# Patient Record
Sex: Female | Born: 1980 | Race: White | Hispanic: No | Marital: Single | Smoking: Former smoker
Health system: Southern US, Community
[De-identification: ages and names within clinical notes are randomized; demographics above are authoritative.]

## PROBLEM LIST (undated history)

## (undated) DIAGNOSIS — R51 Headache: Secondary | ICD-10-CM

## (undated) DIAGNOSIS — I639 Cerebral infarction, unspecified: Secondary | ICD-10-CM

## (undated) DIAGNOSIS — K219 Gastro-esophageal reflux disease without esophagitis: Secondary | ICD-10-CM

## (undated) DIAGNOSIS — IMO0001 Reserved for inherently not codable concepts without codable children: Secondary | ICD-10-CM

## (undated) DIAGNOSIS — J383 Other diseases of vocal cords: Secondary | ICD-10-CM

## (undated) DIAGNOSIS — F419 Anxiety disorder, unspecified: Secondary | ICD-10-CM

## (undated) DIAGNOSIS — R0600 Dyspnea, unspecified: Secondary | ICD-10-CM

## (undated) DIAGNOSIS — K5792 Diverticulitis of intestine, part unspecified, without perforation or abscess without bleeding: Secondary | ICD-10-CM

## (undated) DIAGNOSIS — F32A Depression, unspecified: Secondary | ICD-10-CM

## (undated) DIAGNOSIS — I10 Essential (primary) hypertension: Secondary | ICD-10-CM

## (undated) DIAGNOSIS — A419 Sepsis, unspecified organism: Secondary | ICD-10-CM

## (undated) DIAGNOSIS — N179 Acute kidney failure, unspecified: Secondary | ICD-10-CM

## (undated) DIAGNOSIS — J9690 Respiratory failure, unspecified, unspecified whether with hypoxia or hypercapnia: Secondary | ICD-10-CM

## (undated) DIAGNOSIS — J189 Pneumonia, unspecified organism: Secondary | ICD-10-CM

## (undated) DIAGNOSIS — F319 Bipolar disorder, unspecified: Secondary | ICD-10-CM

## (undated) DIAGNOSIS — R569 Unspecified convulsions: Secondary | ICD-10-CM

## (undated) DIAGNOSIS — F329 Major depressive disorder, single episode, unspecified: Secondary | ICD-10-CM

## (undated) DIAGNOSIS — I6350 Cerebral infarction due to unspecified occlusion or stenosis of unspecified cerebral artery: Secondary | ICD-10-CM

## (undated) HISTORY — DX: Cerebral infarction, unspecified: I63.9

## (undated) HISTORY — PX: APPENDECTOMY: SHX54

## (undated) NOTE — *Deleted (*Deleted)
Report given to Melanie Wright ,RN 

---
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
## 2000-10-04 ENCOUNTER — Ambulatory Visit (HOSPITAL_COMMUNITY): Admission: RE | Admit: 2000-10-04 | Discharge: 2000-10-04 | Payer: Self-pay | Admitting: Internal Medicine

## 2000-10-04 ENCOUNTER — Encounter: Payer: Self-pay | Admitting: Internal Medicine

## 2000-10-19 ENCOUNTER — Emergency Department (HOSPITAL_COMMUNITY): Admission: EM | Admit: 2000-10-19 | Discharge: 2000-10-20 | Payer: Self-pay | Admitting: *Deleted

## 2000-11-10 ENCOUNTER — Ambulatory Visit (HOSPITAL_COMMUNITY): Admission: RE | Admit: 2000-11-10 | Discharge: 2000-11-10 | Payer: Self-pay | Admitting: Internal Medicine

## 2000-11-10 ENCOUNTER — Encounter: Payer: Self-pay | Admitting: Internal Medicine

## 2001-01-15 ENCOUNTER — Emergency Department (HOSPITAL_COMMUNITY): Admission: EM | Admit: 2001-01-15 | Discharge: 2001-01-16 | Payer: Self-pay | Admitting: Emergency Medicine

## 2001-02-04 ENCOUNTER — Encounter: Payer: Self-pay | Admitting: *Deleted

## 2001-02-04 ENCOUNTER — Emergency Department (HOSPITAL_COMMUNITY): Admission: EM | Admit: 2001-02-04 | Discharge: 2001-02-04 | Payer: Self-pay | Admitting: *Deleted

## 2001-03-21 ENCOUNTER — Emergency Department (HOSPITAL_COMMUNITY): Admission: EM | Admit: 2001-03-21 | Discharge: 2001-03-22 | Payer: Self-pay | Admitting: *Deleted

## 2001-03-22 ENCOUNTER — Encounter: Payer: Self-pay | Admitting: *Deleted

## 2001-04-07 ENCOUNTER — Emergency Department (HOSPITAL_COMMUNITY): Admission: EM | Admit: 2001-04-07 | Discharge: 2001-04-07 | Payer: Self-pay | Admitting: Emergency Medicine

## 2001-04-29 ENCOUNTER — Emergency Department (HOSPITAL_COMMUNITY): Admission: EM | Admit: 2001-04-29 | Discharge: 2001-04-29 | Payer: Self-pay | Admitting: *Deleted

## 2001-04-29 ENCOUNTER — Encounter: Payer: Self-pay | Admitting: *Deleted

## 2001-06-15 ENCOUNTER — Emergency Department (HOSPITAL_COMMUNITY): Admission: EM | Admit: 2001-06-15 | Discharge: 2001-06-15 | Payer: Self-pay | Admitting: Internal Medicine

## 2001-07-04 ENCOUNTER — Emergency Department (HOSPITAL_COMMUNITY): Admission: EM | Admit: 2001-07-04 | Discharge: 2001-07-04 | Payer: Self-pay | Admitting: Internal Medicine

## 2001-07-06 ENCOUNTER — Emergency Department (HOSPITAL_COMMUNITY): Admission: EM | Admit: 2001-07-06 | Discharge: 2001-07-06 | Payer: Self-pay | Admitting: Emergency Medicine

## 2002-03-15 ENCOUNTER — Inpatient Hospital Stay (HOSPITAL_COMMUNITY): Admission: EM | Admit: 2002-03-15 | Discharge: 2002-03-18 | Payer: Self-pay | Admitting: *Deleted

## 2002-03-15 ENCOUNTER — Encounter: Payer: Self-pay | Admitting: Internal Medicine

## 2002-03-15 ENCOUNTER — Encounter: Payer: Self-pay | Admitting: *Deleted

## 2002-06-01 ENCOUNTER — Emergency Department (HOSPITAL_COMMUNITY): Admission: EM | Admit: 2002-06-01 | Discharge: 2002-06-01 | Payer: Self-pay | Admitting: Emergency Medicine

## 2005-10-23 ENCOUNTER — Emergency Department (HOSPITAL_COMMUNITY): Admission: EM | Admit: 2005-10-23 | Discharge: 2005-10-23 | Payer: Self-pay | Admitting: Emergency Medicine

## 2007-10-19 ENCOUNTER — Emergency Department: Payer: Self-pay | Admitting: Emergency Medicine

## 2007-10-19 ENCOUNTER — Other Ambulatory Visit: Payer: Self-pay

## 2010-05-13 HISTORY — PX: TRANSRECTAL DRAINAGE OF PELVIC ABSCESS: SUR1387

## 2010-10-29 NOTE — Group Therapy Note (Signed)
   NAME:  Taylor [name], Taylor [name]                           ACCOUNT NO.:  [id number]   MEDICAL RECORD NO.:  [id number]                   PATIENT TYPE:  INP   LOCATION:  A209                                 FACILITY:  [facility]   PHYSICIAN:  Edward L. Luan Pulling, M.D.             DATE OF BIRTH:  Jan 23, 1981   DATE OF PROCEDURE:  03/18/2002  DATE OF DISCHARGE:                                   PROGRESS NOTE   SUMMARY:  The patient has been able to come off the ventilator and transfer  from the intensive care unit, and at this point I will plan to sign off.  Thank you very much for allowing me to see her with you.                                               Edward L. Luan Pulling, M.D.    ELH/MEDQ  D:  03/18/2002  T:  03/19/2002  Job:  [id number]
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
## 2010-10-29 NOTE — Discharge Summary (Signed)
NAME:  Taylor [name], Taylor [name]                           ACCOUNT NO.:  [id number]   MEDICAL RECORD NO.:  [id number]                   PATIENT TYPE:  INP   LOCATION:  A209                                 FACILITY:  [facility]   PHYSICIAN:  Angus G. Everette Rank, M.D.              DATE OF BIRTH:  16-Feb-1981   DATE OF ADMISSION:  03/15/2002  DATE OF DISCHARGE:  03/18/2002                                 DISCHARGE SUMMARY   DISCHARGE DIAGNOSES:  1. Narcotic overdose, methadone.  2. Acute respiratory distress.  3. Insulin dependent diabetes.  4. Chronic back pain.  5. Morbid obesity.  6. Depression.  7. Hypertension.   CONDITION ON DISCHARGE:  The patient's condition was stable at the time of  her discharge.   HISTORY:  A 30 year old morbidly obese white female admitted to the ED  unresponsive.  She became lethargic earlier in the day and was stumbling  around.  Currently on methadone for narcotic dependency.  Apparently, she  went to the methadone clinic earlier on the day of admission.  Apparently,  the patient had recently been taking increased quantities of methadone only  above the recommended dosage.  After the time of admission, she was  intubated and placed in ICU.   PHYSICAL EXAMINATION:  VITAL SIGNS:  Blood pressure 162/34, respirations 60,  pulse 150, temperature 100.1.  HEENT:  Eyes, PERRLA, TMs negative.  HEART:  Regular rhythm, no murmurs.  LUNGS:  Distant breath sounds.  ABDOMEN:  No palpable organomegaly, abdomen obese.  EXTREMITIES:  1+ left extremity edema.  NEUROLOGIC:  The patient is unresponsive, but moves all extremities and  withdraws from pain.  SKIN:  No rashes.   LABORATORY DATA:  CBC on admission, WBC 17,500 with hemoglobin 10.9 and  hematocrit 39.  Subsequent CBC on March 16, 2002, WBC 10,400, hemoglobin  10.0, hematocrit 29.8.  Chemistries on admission, sodium 134, potassium 3.8,  chloride 100, CO2 27, glucose 243, BUN 5, creatinine 0.6, calcium 8.8.  Subsequent  chemistries on March 16, 2002, sodium 141, potassium 3.4,  chloride 111, CO2 28, glucose 134, BUN 6, creatinine 0.6.  Prolactin 41.2.  Drug screen essentially negative, but was positive for tricyclics.  Chest x-  ray, endotracheal tube remains in satisfactory position following  intubation.  A prior chest x-ray showed left perihilar left lower lobe  atelectatic change.  A CT of the head was negative for acute intracranial  process, ethmoid and sphenoid sinus disease.   HOSPITAL COURSE:  The patient was intubated following admission.  Dr.  Luan Pulling monitor the patient's condition while on the ventilator.  She  progressively improved and was able to be extubated on March 16, 2002.  Medications which were started were Protonix 40 mg daily, K-Dur 20 mEq  daily, Klonopin 1 mg h.s., trazodone 100 mg h.s.  The patient denied taking  any extra methadone, but according to family members, it was highly  suspicious she may have taken overdose.  Methadone level pending.  The  patient was able to be discharged after three days with appointment to  psychiatrist made prior to her discharge.                                               Angus G. Everette Rank, M.D.    AGM/MEDQ  D:  04/11/2002  T:  04/11/2002  Job:  [id number]

## 2010-10-29 NOTE — Group Therapy Note (Signed)
   NAME:  Taylor [name], Taylor [name]                           ACCOUNT NO.:  [id number]   MEDICAL RECORD NO.:  [id number]                   PATIENT TYPE:  INP   LOCATION:  A209                                 FACILITY:  [facility]   PHYSICIAN:  Edward L. Luan Pulling, M.D.             DATE OF BIRTH:  1980-09-26   DATE OF PROCEDURE:  DATE OF DISCHARGE:                                   PROGRESS NOTE   OBJECTIVE:  Ms. Kloos is doing much better this morning.  She is alert.  She  is less sedated and is doing generally better.  Her chest is much clearer.  As mentioned she is much more alert and able to move around more.  Blood  gases show excellent oxygenation.  Normal pH.  This is on the ventilator, of  course.   ASSESSMENT:  She is much improved.   PLAN:  She is to be switched her ________ and then attempt weaning today.  Hopefully she will be able to come off the ventilator tonight if she  continues to do well.                                               Edward L. Luan Pulling, M.D.    ELH/MEDQ  D:  03/18/2002  T:  03/19/2002  Job:  [id number]

## 2011-05-18 ENCOUNTER — Encounter: Payer: Self-pay | Admitting: Emergency Medicine

## 2011-05-18 ENCOUNTER — Emergency Department (HOSPITAL_COMMUNITY)
Admission: EM | Admit: 2011-05-18 | Discharge: 2011-05-18 | Disposition: A | Discharge status: Home / Self Care | Payer: Self-pay | Attending: Emergency Medicine | Admitting: Emergency Medicine

## 2011-05-18 ENCOUNTER — Emergency Department (HOSPITAL_COMMUNITY): Payer: Self-pay

## 2011-05-18 DIAGNOSIS — R059 Cough, unspecified: Secondary | ICD-10-CM | POA: Insufficient documentation

## 2011-05-18 DIAGNOSIS — E119 Type 2 diabetes mellitus without complications: Secondary | ICD-10-CM | POA: Insufficient documentation

## 2011-05-18 DIAGNOSIS — F3289 Other specified depressive episodes: Secondary | ICD-10-CM | POA: Insufficient documentation

## 2011-05-18 DIAGNOSIS — R079 Chest pain, unspecified: Secondary | ICD-10-CM | POA: Insufficient documentation

## 2011-05-18 DIAGNOSIS — J069 Acute upper respiratory infection, unspecified: Secondary | ICD-10-CM

## 2011-05-18 DIAGNOSIS — R070 Pain in throat: Secondary | ICD-10-CM | POA: Insufficient documentation

## 2011-05-18 DIAGNOSIS — K089 Disorder of teeth and supporting structures, unspecified: Secondary | ICD-10-CM | POA: Insufficient documentation

## 2011-05-18 DIAGNOSIS — F329 Major depressive disorder, single episode, unspecified: Secondary | ICD-10-CM | POA: Insufficient documentation

## 2011-05-18 DIAGNOSIS — J3489 Other specified disorders of nose and nasal sinuses: Secondary | ICD-10-CM | POA: Insufficient documentation

## 2011-05-18 DIAGNOSIS — R51 Headache: Secondary | ICD-10-CM | POA: Insufficient documentation

## 2011-05-18 DIAGNOSIS — F17200 Nicotine dependence, unspecified, uncomplicated: Secondary | ICD-10-CM | POA: Insufficient documentation

## 2011-05-18 DIAGNOSIS — K0889 Other specified disorders of teeth and supporting structures: Secondary | ICD-10-CM

## 2011-05-18 DIAGNOSIS — R05 Cough: Secondary | ICD-10-CM | POA: Insufficient documentation

## 2011-05-18 DIAGNOSIS — R509 Fever, unspecified: Secondary | ICD-10-CM | POA: Insufficient documentation

## 2011-05-18 HISTORY — DX: Depression, unspecified: F32.A

## 2011-05-18 HISTORY — DX: Major depressive disorder, single episode, unspecified: F32.9

## 2011-05-18 MED ORDER — PENICILLIN V POTASSIUM 500 MG PO TABS: 500.0000 mg | ORAL_TABLET | Freq: Four times a day (QID) | ORAL | Status: AC

## 2011-05-18 MED ORDER — PENICILLIN V POTASSIUM 250 MG PO TABS
500.0000 mg | ORAL_TABLET | Freq: Once | ORAL | Status: AC
  Administered 2011-05-18: 500 mg via ORAL
  Filled 2011-05-18: qty 2

## 2011-05-18 MED ORDER — HYDROCODONE-ACETAMINOPHEN 5-325 MG PO TABS
1.0000 | ORAL_TABLET | Freq: Once | ORAL | Status: AC
  Administered 2011-05-18: 1 via ORAL
  Filled 2011-05-18: qty 1

## 2011-05-18 MED ORDER — IBUPROFEN 800 MG PO TABS: 800.0000 mg | ORAL_TABLET | Freq: Three times a day (TID) | ORAL | Status: AC

## 2011-05-18 MED ORDER — ACETAMINOPHEN 500 MG PO TABS
500.0000 mg | ORAL_TABLET | Freq: Once | ORAL | Status: AC
  Administered 2011-05-18: 500 mg via ORAL
  Filled 2011-05-18: qty 1

## 2011-05-18 MED ORDER — HYDROCODONE-ACETAMINOPHEN 5-325 MG PO TABS: 1.0000 | ORAL_TABLET | ORAL | Status: AC | PRN

## 2011-05-18 NOTE — ED Provider Notes (Signed)
History   This chart was scribed for [employer]. Olin Hauser, MD by Kathreen Cornfield. The patient was seen in room APA12/APA12 and the patient's care was started at 8:06AM.    CSN: [id number] Arrival date & time: 05/18/2011  7:35 AM   First MD Initiated Contact with Patient 05/18/11 0751      Chief Complaint  Patient presents with  . Chest Pain  . Cough  . Sore Throat  . Headache    (Consider location/radiation/quality/duration/timing/severity/associated sxs/prior treatment) HPI Taylor [name] is a 30 y.o. female who presents to the Emergency Department complaining of moderate, constant dental pain onset 2 days ago with associated symptoms including right sided facial swelling and tenderness. Patient also c/o moderate to severe non-productive cough onset yesterday with associated chest congestion, fever measured as high as 103. Pt. Denies vomiting, diarrhea, abdominal pain. Pt. Has treated symptoms at home with Ibuprofen, last taken 3 hours ago. Pt. Has a history of smoking.   Past Medical History  Diagnosis Date  . Diabetes mellitus   . Depression     Past Surgical History  Procedure Date  . Appendectomy   . Transrectal drainage of pelvic abscess     History reviewed. No pertinent family history.  History  Substance Use Topics  . Smoking status: Current Everyday Smoker    Types: Cigarettes  . Smokeless tobacco: Not on file  . Alcohol Use: No    OB History    Grav Para Term Preterm Abortions TAB SAB Ect Mult Living                  Review of Systems  10 Systems reviewed and are negative for acute change except as noted in the HPI.  Allergies  Compazine  Home Medications   Current Outpatient Rx  Name Route Sig Dispense Refill  . ALPRAZOLAM 1 MG PO TABS Oral Take 1 mg by mouth 3 (three) times daily as needed. Anxiety/ patient states she usually cuts tab in half and she never takes 3 a day     . DULOXETINE HCL 60 MG PO CPEP Oral Take 60 mg by mouth daily.        BP 135/92   Pulse 99  Temp(Src) 97.9 F (36.6 C) (Oral)  Resp 20  Ht 5\' 4"  (1.626 m)  Wt 180 lb (81.647 kg)  BMI 30.90 kg/m2  SpO2 98%  LMP 05/02/2011  Physical Exam  Nursing note and vitals reviewed. Constitutional: She is oriented to person, place, and time. She appears well-developed and well-nourished. No distress.  HENT:  Head: Normocephalic and atraumatic.  Nose: Nose normal.       Poor dentition of the teeth, black lining at the gum lines, multiple caries. Pre and post auricular adenopathy. No submental adenopathy.   Eyes: EOM are normal. Pupils are equal, round, and reactive to light.  Neck: Neck supple. No tracheal deviation present.  Cardiovascular: Normal rate and regular rhythm.   Pulmonary/Chest: Effort normal. No respiratory distress. She has no wheezes. She has no rales.  Abdominal: Soft. She exhibits no distension. There is no tenderness.  Musculoskeletal: Normal range of motion. She exhibits no edema.  Neurological: She is alert and oriented to person, place, and time. No sensory deficit.  Skin: Skin is warm and dry.  Psychiatric: She has a normal mood and affect. Her behavior is normal.    ED Course  Procedures (including critical care time)  Labs Reviewed - No data to display Dg Chest 2 View  05/18/2011  *RADIOLOGY REPORT*  Clinical Data: Cough.  Left-sided chest pain.  CHEST - 2 VIEW  Comparison:  None.  Findings:  The heart size and mediastinal contours are within normal limits.  Both lungs are clear.  No evidence of pneumothorax or pleural effusion.  The visualized skeletal structures are unremarkable.  IMPRESSION: No active cardiopulmonary disease.  Original Report Authenticated By: Marlaine Hind, M.D.     No diagnosis found.  No results found for this or any previous visit.  DIAGNOSTIC STUDIES: Oxygen Saturation is 98% on room air, normal by my interpretation.    COORDINATION OF CARE:  8:12AM- EDP at bedside discusses treatment plan. 9:26AM-Recheck. EDP  discusses discharge.  MDM  Patient with face pain due to poor dentition and URI symptoms. Chest xray negative. Initiated antibiotic therapy and pain management.Pt stable in ED with no significant deterioration in condition.The patient appears reasonably screened and/or stabilized for discharge and I doubt any other medical condition or other [facility] requiring further screening, evaluation, or treatment in the ED at this time prior to discharge.  .I personally performed the services described in this documentation, which was scribed in my presence. The recorded information has been reviewed and considered.  MDM Reviewed: nursing note and vitals Interpretation: labs        Gypsy Balsam. Olin Hauser, MD 05/18/11 [phone]

## 2011-05-18 NOTE — ED Notes (Signed)
Pt c/o chest pain with headache, cough, and sore throat since last night.

## 2011-06-14 DIAGNOSIS — N179 Acute kidney failure, unspecified: Secondary | ICD-10-CM

## 2011-06-14 HISTORY — DX: Acute kidney failure, unspecified: N17.9

## 2012-02-13 ENCOUNTER — Emergency Department (HOSPITAL_COMMUNITY): Payer: Self-pay

## 2012-02-13 ENCOUNTER — Emergency Department (HOSPITAL_COMMUNITY)
Admission: EM | Admit: 2012-02-13 | Discharge: 2012-02-13 | Disposition: A | Discharge status: Home / Self Care | Payer: Self-pay | Attending: Emergency Medicine | Admitting: Emergency Medicine

## 2012-02-13 ENCOUNTER — Encounter (HOSPITAL_COMMUNITY): Payer: Self-pay | Admitting: *Deleted

## 2012-02-13 DIAGNOSIS — R071 Chest pain on breathing: Secondary | ICD-10-CM | POA: Insufficient documentation

## 2012-02-13 DIAGNOSIS — I10 Essential (primary) hypertension: Secondary | ICD-10-CM | POA: Insufficient documentation

## 2012-02-13 DIAGNOSIS — F329 Major depressive disorder, single episode, unspecified: Secondary | ICD-10-CM | POA: Insufficient documentation

## 2012-02-13 DIAGNOSIS — M25519 Pain in unspecified shoulder: Secondary | ICD-10-CM | POA: Insufficient documentation

## 2012-02-13 DIAGNOSIS — E119 Type 2 diabetes mellitus without complications: Secondary | ICD-10-CM | POA: Insufficient documentation

## 2012-02-13 DIAGNOSIS — M25512 Pain in left shoulder: Secondary | ICD-10-CM

## 2012-02-13 DIAGNOSIS — Z9089 Acquired absence of other organs: Secondary | ICD-10-CM | POA: Insufficient documentation

## 2012-02-13 DIAGNOSIS — F17200 Nicotine dependence, unspecified, uncomplicated: Secondary | ICD-10-CM | POA: Insufficient documentation

## 2012-02-13 DIAGNOSIS — R0789 Other chest pain: Secondary | ICD-10-CM

## 2012-02-13 DIAGNOSIS — F3289 Other specified depressive episodes: Secondary | ICD-10-CM | POA: Insufficient documentation

## 2012-02-13 HISTORY — DX: Essential (primary) hypertension: I10

## 2012-02-13 MED ORDER — IBUPROFEN 600 MG PO TABS: 600.0000 mg | ORAL_TABLET | Freq: Four times a day (QID) | ORAL | Status: AC | PRN

## 2012-02-13 MED ORDER — IBUPROFEN 400 MG PO TABS
600.0000 mg | ORAL_TABLET | Freq: Once | ORAL | Status: AC
  Administered 2012-02-13: 600 mg via ORAL
  Filled 2012-02-13: qty 2

## 2012-02-13 MED ORDER — METHOCARBAMOL 500 MG PO TABS
500.0000 mg | ORAL_TABLET | Freq: Once | ORAL | Status: AC
  Administered 2012-02-13: 500 mg via ORAL
  Filled 2012-02-13: qty 1

## 2012-02-13 MED ORDER — HYDROCODONE-ACETAMINOPHEN 5-325 MG PO TABS: 1.0000 | ORAL_TABLET | Freq: Four times a day (QID) | ORAL | Status: AC | PRN

## 2012-02-13 MED ORDER — METHOCARBAMOL 500 MG PO TABS: 500.0000 mg | ORAL_TABLET | Freq: Two times a day (BID) | ORAL | Status: AC
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
## 2012-02-13 NOTE — ED Notes (Signed)
Patient with no complaints at this time. Respirations even and unlabored. Skin warm/dry. Discharge instructions reviewed with patient at this time. Patient given opportunity to voice concerns/ask questions. Patient discharged at this time and left Emergency Department with steady gait.   

## 2012-03-02 ENCOUNTER — Emergency Department (HOSPITAL_COMMUNITY)
Admission: EM | Admit: 2012-03-02 | Discharge: 2012-03-03 | Disposition: A | Discharge status: Other Acute Inpatient Hospital | Payer: Self-pay | Attending: Emergency Medicine | Admitting: Emergency Medicine

## 2012-03-02 ENCOUNTER — Emergency Department (HOSPITAL_COMMUNITY): Payer: Self-pay

## 2012-03-02 ENCOUNTER — Encounter (HOSPITAL_COMMUNITY): Payer: Self-pay | Admitting: *Deleted

## 2012-03-02 DIAGNOSIS — R079 Chest pain, unspecified: Secondary | ICD-10-CM | POA: Insufficient documentation

## 2012-03-02 DIAGNOSIS — H53489 Generalized contraction of visual field, unspecified eye: Secondary | ICD-10-CM | POA: Insufficient documentation

## 2012-03-02 DIAGNOSIS — R599 Enlarged lymph nodes, unspecified: Secondary | ICD-10-CM | POA: Insufficient documentation

## 2012-03-02 DIAGNOSIS — E119 Type 2 diabetes mellitus without complications: Secondary | ICD-10-CM | POA: Insufficient documentation

## 2012-03-02 DIAGNOSIS — F1910 Other psychoactive substance abuse, uncomplicated: Secondary | ICD-10-CM | POA: Insufficient documentation

## 2012-03-02 DIAGNOSIS — F29 Unspecified psychosis not due to a substance or known physiological condition: Secondary | ICD-10-CM | POA: Insufficient documentation

## 2012-03-02 LAB — CBC WITH DIFFERENTIAL/PLATELET
Basophils Relative: 0 % (ref 0–1)
Eosinophils Absolute: 0.1 10*3/uL (ref 0.0–0.7)
Eosinophils Relative: 1 % (ref 0–5)
HCT: 42.4 % (ref 36.0–46.0)
Hemoglobin: 14.7 g/dL (ref 12.0–15.0)
Lymphs Abs: 4.1 10*3/uL — ABNORMAL HIGH (ref 0.7–4.0)
MCH: 30 pg (ref 26.0–34.0)
MCV: 86.5 fL (ref 78.0–100.0)
Neutrophils Relative %: 60 % (ref 43–77)
RBC: 4.9 MIL/uL (ref 3.87–5.11)
WBC: 12 10*3/uL — ABNORMAL HIGH (ref 4.0–10.5)

## 2012-03-02 LAB — URINALYSIS, ROUTINE W REFLEX MICROSCOPIC

## 2012-03-02 LAB — GLUCOSE, CAPILLARY

## 2012-03-02 LAB — COMPREHENSIVE METABOLIC PANEL
ALT: 7 U/L (ref 0–35)
AST: 13 U/L (ref 0–37)
Albumin: 3.6 g/dL (ref 3.5–5.2)
Alkaline Phosphatase: 111 U/L (ref 39–117)
CO2: 27 mEq/L (ref 19–32)
Chloride: 95 mEq/L — ABNORMAL LOW (ref 96–112)
Glucose, Bld: 338 mg/dL — ABNORMAL HIGH (ref 70–99)
Total Bilirubin: 0.5 mg/dL (ref 0.3–1.2)

## 2012-03-02 LAB — URINE MICROSCOPIC-ADD ON

## 2012-03-02 LAB — RAPID URINE DRUG SCREEN, HOSP PERFORMED
Amphetamines: NOT DETECTED
Benzodiazepines: NOT DETECTED
Opiates: NOT DETECTED
Tetrahydrocannabinol: NOT DETECTED

## 2012-03-02 LAB — LIPASE, BLOOD: Lipase: 35 U/L (ref 11–59)

## 2012-03-02 MED ORDER — SODIUM CHLORIDE 0.9 % IV SOLN: INTRAVENOUS | Status: DC

## 2012-03-02 MED ORDER — POTASSIUM CHLORIDE CRYS ER 20 MEQ PO TBCR
40.0000 meq | EXTENDED_RELEASE_TABLET | Freq: Once | ORAL | Status: AC
  Administered 2012-03-02: 40 meq via ORAL
  Filled 2012-03-02: qty 2

## 2012-03-02 MED ORDER — SODIUM CHLORIDE 0.9 % IV BOLUS (SEPSIS)
1000.0000 mL | Freq: Once | INTRAVENOUS | Status: AC
  Administered 2012-03-02: 1000 mL via INTRAVENOUS
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
## 2012-03-02 NOTE — ED Notes (Signed)
Pt had a cbg done at this time . Taylor [name]

## 2012-03-02 NOTE — ED Notes (Addendum)
Taken bottle of xanax 90 day rx over 10 day span, No xanax in 6 days,  Hallucinations, visual. Brother says pt fell recently and was given vicodin for pain, Has been "on a pill rampage" since then. Pt says she  Lost 50 lbs since July. Took large amt of tramadol recently also per brother.

## 2012-03-02 NOTE — ED Provider Notes (Addendum)
History   This chart was scribed for Mervin Kung, MD, by Joline Maxcy. The patient was seen in room APA04/APA04 and the patient's care was started at 1744.    CSN: [id number]  Arrival date & time 03/02/12  32   First MD Initiated Contact with Patient 03/02/12 1744      Chief Complaint  Patient presents with  . Medical Clearance    (Consider location/radiation/quality/duration/timing/severity/associated sxs/prior treatment) HPI Comments: Taylor [name] is a 31 y.o. female who presents to the Emergency Department complaining of drug overdose that occurred 6-7 days ago with worsening symptoms over the past 48 hours. Pt states she has taken 90 day supply of 1 mg xanax, 180 day supply of tramadol, and hydrocodone. Pt has associated symptoms of chest pain that began in the last 20 minutes, defensive behavior, hallucinations, and visual changes that feel like tunnel vision.  Pt states that she took Cymbalta today, but denies taking any other pills. Pt states she took the pills because of pain, but continued to take them to achieve the initial feeling. Pt denies having suicidal ideations, fevers, abdominal pain, dysuria, nausea, vomiting, diarrhea, or generalized weakness. Pt's brother states that the pt has a h/o drug overdoses with the last episode occurred 1.5 to 2 years ago. Pt has been hospitalized numerous times for similar issues and has been treated for substance abuse. Pt's brother states that this episode brought on by an ED visit on 9/2. Pt also has a h/o depression, anxiety, and DM.  PCP: Dr. Reece Levy at [facility]   Patient is a 31 y.o. female presenting with Overdose. The history is provided by the patient and a relative.  Drug Overdose This is a recurrent problem. The current episode started 2 days ago. The problem occurs constantly. The problem has been gradually worsening. Associated symptoms include chest pain. Pertinent negatives include no abdominal pain and no shortness of  breath. She has tried nothing for the symptoms.    Past Medical History  Diagnosis Date  . Diabetes mellitus   . Depression   . Hypertension     Past Surgical History  Procedure Date  . Appendectomy   . Transrectal drainage of pelvic abscess     History reviewed. No pertinent family history.  History  Substance Use Topics  . Smoking status: Current Every Day Smoker    Types: Cigarettes  . Smokeless tobacco: Not on file  . Alcohol Use: No    OB History    Grav Para Term Preterm Abortions TAB SAB Ect Mult Living                  Review of Systems  Constitutional: Negative for fever.  HENT: Negative for neck pain.   Eyes: Positive for visual disturbance.       Tunnel vision.  Respiratory: Negative for shortness of breath.   Cardiovascular: Positive for chest pain.       Chest pain within the last 20 minutes.  Gastrointestinal: Negative for nausea, vomiting, abdominal pain and diarrhea.  Genitourinary: Negative for dysuria.  Musculoskeletal: Negative for back pain.  Neurological: Negative for weakness.  Psychiatric/Behavioral: Positive for hallucinations. Negative for suicidal ideas and self-injury.  All other systems reviewed and are negative.    Allergies  Compazine and Oxycodone  Home Medications   Current Outpatient Rx  Name Route Sig Dispense Refill  . ALPRAZOLAM 1 MG PO TABS Oral Take 1 mg by mouth 3 (three) times daily as needed. Anxiety/ patient states she  usually cuts tab in half and she never takes 3 a day     . DULOXETINE HCL 60 MG PO CPEP Oral Take 60 mg by mouth daily.      . IBUPROFEN 200 MG PO TABS Oral Take 400 mg by mouth every 6 (six) hours as needed. Pain      BP 144/78  Pulse 107  Temp 98.7 F (37.1 C) (Oral)  Resp 18  Ht 5\' 4"  (1.626 m)  Wt 170 lb (77.111 kg)  BMI 29.18 kg/m2  SpO2 98%  LMP 02/13/2012  Physical Exam  Nursing note and vitals reviewed. Constitutional: She is oriented to person, place, and time. She appears  well-developed and well-nourished. No distress.  HENT:  Head: Normocephalic and atraumatic.       Mucous membranes moist.  Eyes: EOM are normal. Pupils are equal, round, and reactive to light.       Pupils are 3 mm and equal.  Neck: Normal range of motion. Neck supple. No tracheal deviation present.       Cervical adenopathy anteriorly, but not posteriorly.   Cardiovascular: Normal rate, regular rhythm and normal heart sounds.   No murmur heard. Pulmonary/Chest: Effort normal and breath sounds normal. No respiratory distress. She exhibits no tenderness.  Abdominal: Soft. Bowel sounds are normal. She exhibits no distension. There is no tenderness.  Musculoskeletal: Normal range of motion. She exhibits no edema.       Moves all extremities.   Lymphadenopathy:    She has cervical adenopathy.  Neurological: She is alert and oriented to person, place, and time.       Cranial nerves intact. No protonater drift.  Skin: Skin is warm. She is diaphoretic.  Psychiatric: She has a normal mood and affect. Her behavior is normal.    ED Course  Procedures (including critical care time)  DIAGNOSTIC STUDIES: Oxygen Saturation is 98% on room air, normal by my interpretation.    COORDINATION OF CARE:  18:25- Discussed planned course of treatment with the patient, including a drug screen, UA, blood work, and acetaminophen, and chest x-ray who is agreeable at this time.  Results for orders placed during the hospital encounter of 03/02/12  CBC WITH DIFFERENTIAL      Component Value Range   WBC 12.0 (*) 4.0 - 10.5 K/uL   RBC 4.90  3.87 - 5.11 MIL/uL   Hemoglobin 14.7  12.0 - 15.0 g/dL   HCT 42.4  36.0 - 46.0 %   MCV 86.5  78.0 - 100.0 fL   MCH 30.0  26.0 - 34.0 pg   MCHC 34.7  30.0 - 36.0 g/dL   RDW 13.9  11.5 - 15.5 %   Platelets 263  150 - 400 K/uL   Neutrophils Relative 60  43 - 77 %   Neutro Abs 7.2  1.7 - 7.7 K/uL   Lymphocytes Relative 34  12 - 46 %   Lymphs Abs 4.1 (*) 0.7 - 4.0 K/uL     Monocytes Relative 5  3 - 12 %   Monocytes Absolute 0.6  0.1 - 1.0 K/uL   Eosinophils Relative 1  0 - 5 %   Eosinophils Absolute 0.1  0.0 - 0.7 K/uL   Basophils Relative 0  0 - 1 %   Basophils Absolute 0.0  0.0 - 0.1 K/uL  COMPREHENSIVE METABOLIC PANEL      Component Value Range   Sodium 132 (*) 135 - 145 mEq/L   Potassium 3.3 (*) 3.5 - 5.1  mEq/L   Chloride 95 (*) 96 - 112 mEq/L   CO2 27  19 - 32 mEq/L   Glucose, Bld 338 (*) 70 - 99 mg/dL   BUN 8  6 - 23 mg/dL   Creatinine, Ser 0.41 (*) 0.50 - 1.10 mg/dL   Calcium 9.6  8.4 - 10.5 mg/dL   Total Protein 7.3  6.0 - 8.3 g/dL   Albumin 3.6  3.5 - 5.2 g/dL   AST 13  0 - 37 U/L   ALT 7  0 - 35 U/L   Alkaline Phosphatase 111  39 - 117 U/L   Total Bilirubin 0.5  0.3 - 1.2 mg/dL   GFR calc non Af Amer >90  >90 mL/min   GFR calc Af Amer >90  >90 mL/min  LIPASE, BLOOD      Component Value Range   Lipase 35  11 - 59 U/L  URINALYSIS, ROUTINE W REFLEX MICROSCOPIC      Component Value Range   Color, Urine YELLOW  YELLOW   APPearance CLEAR  CLEAR   Specific Gravity, Urine 1.025  1.005 - 1.030   pH 6.5  5.0 - 8.0   Glucose, UA >1000 (*) NEGATIVE mg/dL   Hgb urine dipstick SMALL (*) NEGATIVE   Bilirubin Urine NEGATIVE  NEGATIVE   Ketones, ur 15 (*) NEGATIVE mg/dL   Protein, ur TRACE (*) NEGATIVE mg/dL   Urobilinogen, UA 0.2  0.0 - 1.0 mg/dL   Nitrite NEGATIVE  NEGATIVE   Leukocytes, UA TRACE (*) NEGATIVE  PREGNANCY, URINE      Component Value Range   Preg Test, Ur NEGATIVE  NEGATIVE  URINE RAPID DRUG SCREEN (HOSP PERFORMED)      Component Value Range   Opiates NONE DETECTED  NONE DETECTED   Cocaine NONE DETECTED  NONE DETECTED   Benzodiazepines NONE DETECTED  NONE DETECTED   Amphetamines NONE DETECTED  NONE DETECTED   Tetrahydrocannabinol NONE DETECTED  NONE DETECTED   Barbiturates NONE DETECTED  NONE DETECTED  ACETAMINOPHEN LEVEL      Component Value Range   Acetaminophen (Tylenol), Serum <15.0  10 - 30 ug/mL  URINE  MICROSCOPIC-ADD ON      Component Value Range   Squamous Epithelial / LPF MANY (*) RARE   WBC, UA 11-20  <3 WBC/hpf   RBC / HPF 3-6  <3 RBC/hpf   Bacteria, UA MANY (*) RARE   Urine-Other CLUE CELLS PRESENT        Date: 03/02/2012  Rate: 100  Rhythm: normal sinus rhythm  QRS Axis: normal  Intervals: normal  ST/T Wave abnormalities: normal  Conduction Disutrbances:none  Narrative Interpretation:   Old EKG Reviewed: none available    Labs Reviewed  CBC WITH DIFFERENTIAL - Abnormal; Notable for the following:    WBC 12.0 (*)     Lymphs Abs 4.1 (*)     All other components within normal limits  COMPREHENSIVE METABOLIC PANEL - Abnormal; Notable for the following:    Sodium 132 (*)     Potassium 3.3 (*)     Chloride 95 (*)     Glucose, Bld 338 (*)     Creatinine, Ser 0.41 (*)     All other components within normal limits  URINALYSIS, ROUTINE W REFLEX MICROSCOPIC - Abnormal; Notable for the following:    Glucose, UA >1000 (*)     Hgb urine dipstick SMALL (*)     Ketones, ur 15 (*)     Protein, ur TRACE (*)  Leukocytes, UA TRACE (*)     All other components within normal limits  URINE MICROSCOPIC-ADD ON - Abnormal; Notable for the following:    Squamous Epithelial / LPF MANY (*)     Bacteria, UA MANY (*)     All other components within normal limits  LIPASE, BLOOD  PREGNANCY, URINE  URINE RAPID DRUG SCREEN (HOSP PERFORMED)  ACETAMINOPHEN LEVEL  URINE CULTURE   Dg Chest 2 View  03/02/2012  *RADIOLOGY REPORT*  Clinical Data: Cough, fever  CHEST - 2 VIEW  Comparison: 02/19/2012  Findings: Cardiomediastinal silhouette is unremarkable.  No acute infiltrate or pleural effusion.  No pulmonary edema.  Bony thorax is unremarkable.  IMPRESSION: No active disease.   Original Report Authenticated By: Lahoma Crocker, M.D.    Results for orders placed during the hospital encounter of 03/02/12  CBC WITH DIFFERENTIAL      Component Value Range   WBC 12.0 (*) 4.0 - 10.5 K/uL   RBC 4.90   3.87 - 5.11 MIL/uL   Hemoglobin 14.7  12.0 - 15.0 g/dL   HCT 42.4  36.0 - 46.0 %   MCV 86.5  78.0 - 100.0 fL   MCH 30.0  26.0 - 34.0 pg   MCHC 34.7  30.0 - 36.0 g/dL   RDW 13.9  11.5 - 15.5 %   Platelets 263  150 - 400 K/uL   Neutrophils Relative 60  43 - 77 %   Neutro Abs 7.2  1.7 - 7.7 K/uL   Lymphocytes Relative 34  12 - 46 %   Lymphs Abs 4.1 (*) 0.7 - 4.0 K/uL   Monocytes Relative 5  3 - 12 %   Monocytes Absolute 0.6  0.1 - 1.0 K/uL   Eosinophils Relative 1  0 - 5 %   Eosinophils Absolute 0.1  0.0 - 0.7 K/uL   Basophils Relative 0  0 - 1 %   Basophils Absolute 0.0  0.0 - 0.1 K/uL  COMPREHENSIVE METABOLIC PANEL      Component Value Range   Sodium 132 (*) 135 - 145 mEq/L   Potassium 3.3 (*) 3.5 - 5.1 mEq/L   Chloride 95 (*) 96 - 112 mEq/L   CO2 27  19 - 32 mEq/L   Glucose, Bld 338 (*) 70 - 99 mg/dL   BUN 8  6 - 23 mg/dL   Creatinine, Ser 0.41 (*) 0.50 - 1.10 mg/dL   Calcium 9.6  8.4 - 10.5 mg/dL   Total Protein 7.3  6.0 - 8.3 g/dL   Albumin 3.6  3.5 - 5.2 g/dL   AST 13  0 - 37 U/L   ALT 7  0 - 35 U/L   Alkaline Phosphatase 111  39 - 117 U/L   Total Bilirubin 0.5  0.3 - 1.2 mg/dL   GFR calc non Af Amer >90  >90 mL/min   GFR calc Af Amer >90  >90 mL/min  LIPASE, BLOOD      Component Value Range   Lipase 35  11 - 59 U/L  URINALYSIS, ROUTINE W REFLEX MICROSCOPIC      Component Value Range   Color, Urine YELLOW  YELLOW   APPearance CLEAR  CLEAR   Specific Gravity, Urine 1.025  1.005 - 1.030   pH 6.5  5.0 - 8.0   Glucose, UA >1000 (*) NEGATIVE mg/dL   Hgb urine dipstick SMALL (*) NEGATIVE   Bilirubin Urine NEGATIVE  NEGATIVE   Ketones, ur 15 (*) NEGATIVE mg/dL   Protein,  ur TRACE (*) NEGATIVE mg/dL   Urobilinogen, UA 0.2  0.0 - 1.0 mg/dL   Nitrite NEGATIVE  NEGATIVE   Leukocytes, UA TRACE (*) NEGATIVE  PREGNANCY, URINE      Component Value Range   Preg Test, Ur NEGATIVE  NEGATIVE  URINE RAPID DRUG SCREEN (HOSP PERFORMED)      Component Value Range   Opiates NONE  DETECTED  NONE DETECTED   Cocaine NONE DETECTED  NONE DETECTED   Benzodiazepines NONE DETECTED  NONE DETECTED   Amphetamines NONE DETECTED  NONE DETECTED   Tetrahydrocannabinol NONE DETECTED  NONE DETECTED   Barbiturates NONE DETECTED  NONE DETECTED  ACETAMINOPHEN LEVEL      Component Value Range   Acetaminophen (Tylenol), Serum <15.0  10 - 30 ug/mL  URINE MICROSCOPIC-ADD ON      Component Value Range   Squamous Epithelial / LPF MANY (*) RARE   WBC, UA 11-20  <3 WBC/hpf   RBC / HPF 3-6  <3 RBC/hpf   Bacteria, UA MANY (*) RARE   Urine-Other CLUE CELLS PRESENT       1. Psychosis   2. Substance abuse   3. Diabetes mellitus       MDM  Workup here negative for any significant medications from the medication overdose no evidence of an delayed Tylenol toxicity. Liver function test are normal urine drug screen is also negative for benzos despite history of taking Xanax frequently. Patient is nontoxic no acute distress. Basic labs to show an elevation in her blood sugar she is a known diabetic has not been taking her medications or following her diet.  Patient's labs here do show some hyperglycemia she is a known diabetic she has not been following her diet but should correct itself with IV fluids IV fluids also given for the very mild hyponatremia and also given 40 mg potassium for the mild hypokalemia. Urine has been sent as a culture suspect his contaminated based on the urinalysis. Patient has been medically cleared has been evaluated back team do to her delusions and hallucinations they feel she needs to be admitted to a mental health facility.  Patient is nontoxic no acute distress.  Patient is a voluntary commitment at this time no suicidal ideation no homicidal ideation.  Patient with obvious delusions and hallucinations during the observation period in the emergency apartment. [facility] team is working on placement will most likely occur in the morning.   I personally  performed the services described in this documentation, which was scribed in my presence. The recorded information has been reviewed and considered.         Mervin Kung, MD 03/02/12 2013  Mervin Kung, MD 03/02/12 2019  Mervin Kung, MD 03/02/12 2201  Addendum: [facility] accepted at [facility] for [facility] health admission. Placed blood sugars come down into the 200 range. Patient nontoxic and clinically stable for admission by psychiatric unit.  Mervin Kung, MD 03/02/12 2258

## 2012-03-02 NOTE — ED Notes (Signed)
Patient transported to X-ray 

## 2012-03-02 NOTE — ED Notes (Signed)
Pt states started back on prescription pain meds a week ago when she came to er for wrist injury. Has been unable to stop using. Feels like she needs them all the time again.

## 2012-03-02 NOTE — ED Notes (Signed)
ekg done at this time and given to DR. Zacowski Alleya Demeter

## 2012-03-02 NOTE — BH Assessment (Signed)
Assessment Note   Taylor [name] is an 31 y.o. female. Pt brought to [facility] by family due to several concerns.  Pt has history of opiate and xanax abuse and pt has been taking too much of these meds again recently.  Pt has run out of meds at this time, last use of any pills was 6 days ago.  Pt reports she did go through withdrawals for several days but is feeling better currently.  Pt now has begun to hallucinate and this is an issue that has not occurred before.  Pt's mother is at [facility] with pt and reports that pt called her at 2am last night saying her uncle was in her home. (Pt's uncle is in a facility out of the area).  Today pt was over at the family home and mother reports pt was having visual hallucinations, talking to her infant niece, who was not present.  Pt also told family that her mother had taken the baby into the basement when mother was not home either.  Pt also seeing animals in the home.  Pt does attend [facility] for depression/Anxiety.  Denies SI/HI and family does not have concerns for SI/HI.  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 Diabetes mellitus   . Depression   . Hypertension    Axis IV: economic problems Axis V: 21-30 behavior considerably influenced by delusions or hallucinations OR serious impairment in judgment, communication OR inability to function in almost all areas  Past Medical History:  Past Medical History  Diagnosis Date  . Diabetes mellitus   . Depression   . Hypertension     Past Surgical History  Procedure Date  . Appendectomy   . Transrectal drainage of pelvic abscess     Family History: History reviewed. No pertinent family history.  Social History:  reports that she has been smoking Cigarettes.  She does not have any smokeless tobacco history on file. She reports that she does not drink alcohol or use illicit drugs.  Additional Social History:   Alcohol / Drug Use Pain Medications: Current abuse Prescriptions: Current abuse Over the Counter: Pd denies History of alcohol / drug use?: Yes Longest period of sobriety (when/how long): none recent Negative Consequences of Use: Personal relationships Substance #1 Name of Substance 1: hydrocodone 1 - Amount (size/oz): 15 pills in 6 days.  Pt given script in ER. 1 - Frequency: daily 1 - Duration: 6 days 1 - Last Use / Amount: 12 days ago Substance #2 Name of Substance 2: tramadol.  Pt used entire month's supply of 180 pills in 8 days. 2 - Amount (size/oz): 20 pills 2 - Frequency: daily 2 - Duration: 8 days 2 - Last Use / Amount: 1 week ago Substance #3 Name of Substance 3: xanax.  Pt used months worth of xanax by taking 6mg  per day, is prescribed 3 mg per day. 3 - Amount (size/oz): 6mg  3 - Frequency: daily 3 - Duration: 2 weeks 3 - Last Use / Amount: 6 days ago.  CIWA: CIWA-Ar BP: 144/78 mmHg Pulse Rate: 107  COWS: Clinical Opiate Withdrawal Scale (COWS) Resting Pulse Rate: Pulse Rate 101-120 Sweating: No report of chills or flushing Restlessness: Able to sit still Pupil Size: Pupils pinned or normal size for room light Bone or Joint Aches: Not present Runny Nose or Tearing: Not present GI Upset: No GI symptoms Tremor: No tremor Yawning: No yawning Anxiety  or Irritability: None Gooseflesh Skin: Skin is smooth COWS Total Score: 2   Allergies:  Allergies  Allergen Reactions  . Compazine Other (See Comments)    Unknown  . Oxycodone     Home Medications:  (Not in a hospital admission)  OB/GYN Status:  Patient's last menstrual period was 02/13/2012.  General Assessment Data Location of Assessment: [facility] Assessment: Yes Living Arrangements: Alone Can pt return to current living arrangement?: Yes Admission Status: Voluntary Is patient capable of signing voluntary admission?: Yes Transfer from: [facility] Referral Source: Self/Family/Friend  Education  Status Is patient currently in school?: No  Risk to self Suicidal Ideation: No Suicidal Intent: No Is patient at risk for suicide?: No Suicidal Plan?: No Access to Means: No What has been your use of drugs/alcohol within the last 12 months?: current abuse of prescription pills Previous Attempts/Gestures: No Intentional Self Injurious Behavior: None Family Suicide History: No Recent stressful life event(s): Financial Problems;Other (Comment) (changes at work) Persecutory voices/beliefs?: No Depression: Yes Depression Symptoms: Tearfulness;Isolating;Fatigue;Loss of interest in usual pleasures Substance abuse history and/or treatment for substance abuse?: Yes Suicide prevention information given to non-admitted patients: Not applicable  Risk to Others Homicidal Ideation: No Thoughts of Harm to Others: No Current Homicidal Intent: No Current Homicidal Plan: No Access to Homicidal Means: No History of harm to others?: No Assessment of Violence: None Noted Does patient have access to weapons?: No Criminal Charges Pending?: No Does patient have a court date: No  Psychosis Hallucinations: Visual;Auditory (see note) Delusions: None noted  Mental Status Report Appear/Hygiene: Disheveled Eye Contact: Fair Motor Activity: Unremarkable Speech: Logical/coherent Level of Consciousness: Alert Mood: Depressed;Anxious Affect: Appropriate to circumstance Anxiety Level: Moderate Thought Processes: Relevant Judgement: Impaired Orientation: Person;Place;Time;Situation Obsessive Compulsive Thoughts/Behaviors: None  Cognitive Functioning Concentration: Normal Memory: Recent Intact;Remote Intact IQ: Below Average Level of Function:  (unknown) Insight: Fair Impulse Control: Fair Appetite: Poor Weight Loss: 70  (significant weight loss in past 6 weeks) Weight Gain: 0  Sleep: Increased Total Hours of Sleep: 9  Vegetative Symptoms: None  ADLScreening [facility] Assessment  Services) Patient's cognitive ability adequate to safely complete daily activities?: Yes Patient able to express need for assistance with ADLs?: Yes Independently performs ADLs?: Yes (appropriate for developmental age)  Abuse/Neglect [facility]) Physical Abuse: Denies Verbal Abuse: Denies Sexual Abuse: Denies  Prior Inpatient Therapy Prior Inpatient Therapy: Yes (multipl detoxes for pills, also mental health admit at age15) Prior Therapy Dates: 2011 Prior Therapy Facilty/Provider(s): [facility] Reason for Treatment: Detox  Prior Outpatient Therapy Prior Outpatient Therapy: Yes Prior Therapy Dates: current Prior Therapy Facilty/Provider(s): [facility] Reason for Treatment: meds  ADL Screening (condition at time of admission) Patient's cognitive ability adequate to safely complete daily activities?: Yes Patient able to express need for assistance with ADLs?: Yes Independently performs ADLs?: Yes (appropriate for developmental age) Weakness of Legs: None Weakness of Arms/Hands: None  Home Assistive Devices/Equipment Home Assistive Devices/Equipment: None    Abuse/Neglect Assessment (Assessment to be complete while patient is alone) Physical Abuse: Denies Verbal Abuse: Denies Sexual Abuse: Denies Exploitation of patient/patient's resources: Denies Self-Neglect: Denies Values / Beliefs Cultural Requests During Hospitalization: None Spiritual Requests During Hospitalization: None   Advance Directives (For Healthcare) Advance Directive: Patient does not have advance directive;Patient would not like information    Additional Information 1:1 In Past 12 Months?: No CIRT Risk: No Elopement Risk: No Does patient have medical clearance?: Yes     Disposition: Discussed pt with Dr Rogene Houston who agreed with plan to refer  for inpt psych treatment.  Pt referred to [facility]. Disposition Disposition of Patient: Inpatient treatment program Type of inpatient treatment program:  Adult  On Site Evaluation by:   Reviewed with Physician:     Joanne Chars 03/02/2012 7:45 PM

## 2012-03-02 NOTE — ED Notes (Signed)
Pt accecpted to [facility] by dr Abbey Chatters. [place] [address]. Call report to [phone]

## 2012-03-02 NOTE — ED Notes (Signed)
[facility] in w/ pt at this time.

## 2012-03-03 NOTE — ED Notes (Signed)
Pt placed under IVC. [place] called for transport.

## 2012-03-03 NOTE — ED Notes (Signed)
Pt refusing to be transported by [facility] to [facility]. EDP placing pt under IVC. Marya Amsler called from [facility] to advise of the new condition.

## 2012-03-03 NOTE — ED Provider Notes (Signed)
31yo F, actively hallucinating, paranoid, aggressive, defensive.  Accepted to [facility].  Transport team here; pt now stating she does not want to go.  Will change voluntary status to IVC.  Paperwork completed.  ED RN called [facility] team and [facility] who will still accept pt for admit.   Alfonzo Feller, DO 03/03/12 [phone]

## 2012-03-05 LAB — URINE CULTURE

## 2012-04-13 DIAGNOSIS — J9690 Respiratory failure, unspecified, unspecified whether with hypoxia or hypercapnia: Secondary | ICD-10-CM | POA: Diagnosis present

## 2012-04-13 DIAGNOSIS — I6350 Cerebral infarction due to unspecified occlusion or stenosis of unspecified cerebral artery: Secondary | ICD-10-CM

## 2012-04-13 HISTORY — PX: TRACHEOSTOMY: SUR1362

## 2012-04-13 HISTORY — PX: INSERTION OF DIALYSIS CATHETER: SHX1324

## 2012-04-13 HISTORY — DX: Respiratory failure, unspecified, unspecified whether with hypoxia or hypercapnia: J96.90

## 2012-04-13 HISTORY — DX: Cerebral infarction due to unspecified occlusion or stenosis of unspecified cerebral artery: I63.50

## 2012-05-10 DIAGNOSIS — I6350 Cerebral infarction due to unspecified occlusion or stenosis of unspecified cerebral artery: Secondary | ICD-10-CM | POA: Insufficient documentation

## 2012-05-25 DIAGNOSIS — Z9119 Patient's noncompliance with other medical treatment and regimen: Secondary | ICD-10-CM | POA: Insufficient documentation

## 2012-07-07 ENCOUNTER — Encounter (HOSPITAL_COMMUNITY): Payer: Self-pay

## 2012-07-07 ENCOUNTER — Emergency Department (HOSPITAL_COMMUNITY): Payer: Medicaid Other

## 2012-07-07 ENCOUNTER — Emergency Department (HOSPITAL_COMMUNITY)
Admission: EM | Admit: 2012-07-07 | Discharge: 2012-07-07 | Disposition: A | Discharge status: Home / Self Care | Payer: Medicaid Other | Attending: Emergency Medicine | Admitting: Emergency Medicine

## 2012-07-07 DIAGNOSIS — F17200 Nicotine dependence, unspecified, uncomplicated: Secondary | ICD-10-CM | POA: Insufficient documentation

## 2012-07-07 DIAGNOSIS — F3289 Other specified depressive episodes: Secondary | ICD-10-CM | POA: Insufficient documentation

## 2012-07-07 DIAGNOSIS — Z79899 Other long term (current) drug therapy: Secondary | ICD-10-CM | POA: Insufficient documentation

## 2012-07-07 DIAGNOSIS — I10 Essential (primary) hypertension: Secondary | ICD-10-CM | POA: Insufficient documentation

## 2012-07-07 DIAGNOSIS — E119 Type 2 diabetes mellitus without complications: Secondary | ICD-10-CM | POA: Insufficient documentation

## 2012-07-07 DIAGNOSIS — F319 Bipolar disorder, unspecified: Secondary | ICD-10-CM | POA: Insufficient documentation

## 2012-07-07 DIAGNOSIS — Z8619 Personal history of other infectious and parasitic diseases: Secondary | ICD-10-CM | POA: Insufficient documentation

## 2012-07-07 DIAGNOSIS — F329 Major depressive disorder, single episode, unspecified: Secondary | ICD-10-CM | POA: Insufficient documentation

## 2012-07-07 DIAGNOSIS — R0789 Other chest pain: Secondary | ICD-10-CM | POA: Insufficient documentation

## 2012-07-07 DIAGNOSIS — Z8709 Personal history of other diseases of the respiratory system: Secondary | ICD-10-CM | POA: Insufficient documentation

## 2012-07-07 DIAGNOSIS — R0602 Shortness of breath: Secondary | ICD-10-CM | POA: Insufficient documentation

## 2012-07-07 DIAGNOSIS — Z8673 Personal history of transient ischemic attack (TIA), and cerebral infarction without residual deficits: Secondary | ICD-10-CM | POA: Insufficient documentation

## 2012-07-07 DIAGNOSIS — F411 Generalized anxiety disorder: Secondary | ICD-10-CM | POA: Insufficient documentation

## 2012-07-07 HISTORY — DX: Bipolar disorder, unspecified: F31.9

## 2012-07-07 HISTORY — DX: Cerebral infarction due to unspecified occlusion or stenosis of unspecified cerebral artery: I63.50

## 2012-07-07 HISTORY — DX: Respiratory failure, unspecified, unspecified whether with hypoxia or hypercapnia: J96.90

## 2012-07-07 HISTORY — DX: Sepsis, unspecified organism: A41.9

## 2012-07-07 HISTORY — DX: Anxiety disorder, unspecified: F41.9

## 2012-07-07 LAB — CBC WITH DIFFERENTIAL/PLATELET
Basophils Absolute: 0 10*3/uL (ref 0.0–0.1)
Eosinophils Relative: 3 % (ref 0–5)
Lymphocytes Relative: 40 % (ref 12–46)
Lymphs Abs: 2.4 10*3/uL (ref 0.7–4.0)
MCV: 93.4 fL (ref 78.0–100.0)
Neutro Abs: 3.1 10*3/uL (ref 1.7–7.7)
Neutrophils Relative %: 51 % (ref 43–77)
Platelets: 290 10*3/uL (ref 150–400)
RBC: 3.02 MIL/uL — ABNORMAL LOW (ref 3.87–5.11)
WBC: 6.1 10*3/uL (ref 4.0–10.5)

## 2012-07-07 LAB — BASIC METABOLIC PANEL
CO2: 24 mEq/L (ref 19–32)
Chloride: 100 mEq/L (ref 96–112)
Glucose, Bld: 123 mg/dL — ABNORMAL HIGH (ref 70–99)
Potassium: 4 mEq/L (ref 3.5–5.1)
Sodium: 135 mEq/L (ref 135–145)

## 2012-07-07 LAB — TROPONIN I
Troponin I: <0.3 ng/mL (ref <0.30)
Troponin I: <0.3 ng/mL (ref <0.30)

## 2012-07-07 MED ORDER — ASPIRIN 81 MG PO CHEW
324.0000 mg | CHEWABLE_TABLET | Freq: Once | ORAL | Status: AC
  Administered 2012-07-07: 324 mg via ORAL
  Filled 2012-07-07: qty 4

## 2012-07-07 MED ORDER — NITROGLYCERIN 0.4 MG SL SUBL
0.4000 mg | SUBLINGUAL_TABLET | SUBLINGUAL | Status: DC | PRN
  Administered 2012-07-07 (×2): 0.4 mg via SUBLINGUAL
  Filled 2012-07-07: qty 25

## 2012-07-07 MED ORDER — HYDROCODONE-ACETAMINOPHEN 5-325 MG PO TABS
2.0000 | ORAL_TABLET | Freq: Once | ORAL | Status: AC
  Administered 2012-07-07: 2 via ORAL
  Filled 2012-07-07: qty 2

## 2012-07-07 NOTE — ED Provider Notes (Signed)
History     CSN: [id number]  Arrival date & time 07/07/12  1622   First MD Initiated Contact with Patient 07/07/12 1626      Chief Complaint  Patient presents with  . Chest Pain    (Consider location/radiation/quality/duration/timing/severity/associated sxs/prior treatment) HPI Pt with history of multiple medical and psychiatric problems was recently discharged from [facility] following a hyperglycemic coma complicated by stroke. She has been at [facility] for the last 2 days where she reports various chronic aches and pains. Today just prior to arrival she reports onset of severe sharp L chest pain, worse with deep breath associated with SOB but no nausea, cough or fever. No diaphoresis. She has had several previous ED visits with chest pain components. She has no known CAD.   Past Medical History  Diagnosis Date  . Diabetes mellitus   . Depression   . Hypertension   . Respiratory failure   . Cerebral artery occlusion with cerebral infarction   . Sepsis   . Bipolar disorder   . Anxiety     Past Surgical History  Procedure Date  . Appendectomy   . Transrectal drainage of pelvic abscess     No family history on file.  History  Substance Use Topics  . Smoking status: Current Every Day Smoker    Types: Cigarettes  . Smokeless tobacco: Not on file  . Alcohol Use: No    OB History    Grav Para Term Preterm Abortions TAB SAB Ect Mult Living                  Review of Systems All other systems reviewed and are negative except as noted in HPI.   Allergies  Compazine and Oxycodone  Home Medications   Current Outpatient Rx  Name  Route  Sig  Dispense  Refill  . ALPRAZOLAM 1 MG PO TABS   Oral   Take 1 mg by mouth 3 (three) times daily as needed. Anxiety/ patient states she usually cuts tab in half and she never takes 3 a day          . DULOXETINE HCL 60 MG PO CPEP   Oral   Take 60 mg by mouth daily.           . IBUPROFEN 200 MG PO TABS   Oral   Take 400  mg by mouth every 6 (six) hours as needed. Pain           LMP 06/05/2012  Physical Exam  Nursing note and vitals reviewed. Constitutional: She is oriented to person, place, and time. She appears well-developed and well-nourished.  HENT:  Head: Normocephalic and atraumatic.  Eyes: EOM are normal. Pupils are equal, round, and reactive to light.  Neck: Normal range of motion. Neck supple.  Cardiovascular: Normal rate, normal heart sounds and intact distal pulses.   Pulmonary/Chest: Effort normal and breath sounds normal. She exhibits no tenderness.  Abdominal: Bowel sounds are normal. She exhibits no distension. There is no tenderness.  Musculoskeletal: Normal range of motion. She exhibits no edema and no tenderness.  Neurological: She is alert and oriented to person, place, and time. She has normal strength. No cranial nerve deficit or sensory deficit.  Skin: Skin is warm and dry. No rash noted.  Psychiatric: She has a normal mood and affect.    ED Course  Procedures (including critical care time)  Labs Reviewed  CBC WITH DIFFERENTIAL - Abnormal; Notable for the following:    RBC  3.02 (*)     Hemoglobin 9.4 (*)     HCT 28.2 (*)     RDW 15.6 (*)     All other components within normal limits  BASIC METABOLIC PANEL - Abnormal; Notable for the following:    Glucose, Bld 123 (*)     GFR calc non Af Amer 88 (*)     All other components within normal limits  TROPONIN I  TROPONIN I   Dg Chest 2 View  07/07/2012  *RADIOLOGY REPORT*  Clinical Data: Shortness of breath.  Back pain.  CHEST - 2 VIEW  Comparison: [facility] chest x-rays 05/07/2012, 05/06/2012, 04/03/2012 [place] chest x-ray 03/02/2012, 05/18/2011 [facility].  Findings: Cardiomediastinal silhouette unremarkable, unchanged. Lungs clear.  Bronchovascular markings normal.  Pulmonary vascularity normal.  No pneumothorax.  No pleural effusions.  Mild degenerative changes involving the thoracic spine.   No significant interval change.  IMPRESSION: No acute cardiopulmonary disease.  Stable examination.   Original Report Authenticated By: Evangeline Dakin, M.D.      No diagnosis found.    MDM   Date: 07/07/2012  Rate: 98  Rhythm: normal sinus rhythm  QRS Axis: normal  Intervals: normal  ST/T Wave abnormalities: normal  Conduction Disutrbances: none  Narrative Interpretation: unremarkable   7:10 PM Atypical chest pain, negative enzymes x 2. Doubt ACS or CAD as the source of her symptoms. She has been asking for Norco which she takes for her various chronic pain syndromes. Plan to return to [facility], PCP follow up as needed.          Sharnee Douglass B. Karle Starch, MD 07/07/12 [id number]

## 2012-07-07 NOTE — ED Notes (Signed)
Patient would like pain medicine. MD made aware. Patient made aware that MD wants to wait until her second troponin results.

## 2012-07-07 NOTE — ED Notes (Signed)
Patient assisted off bedpan. Bedpan overflowed a little. Bed linens changed. Urine specimen saved in urine cup and labeled with patient sticker in case a specimen is needed.

## 2012-07-07 NOTE — ED Notes (Signed)
Pt via EMS from [facility] due to chest pain that started today with substernal chest pain radiating to left arm. Also c/o SOB

## 2012-07-07 NOTE — ED Notes (Addendum)
Denies chest pain at present but does have back pain and requesting pain meds. MD aware no orders received

## 2012-09-07 ENCOUNTER — Encounter: Payer: Self-pay | Admitting: Diagnostic Neuroimaging

## 2012-09-07 ENCOUNTER — Ambulatory Visit (INDEPENDENT_AMBULATORY_CARE_PROVIDER_SITE_OTHER): Payer: Medicaid Other | Admitting: Diagnostic Neuroimaging

## 2012-09-07 VITALS — BP 129/76 | HR 83 | Temp 97.0°F | Ht 64.0 in | Wt 137.0 lb

## 2012-09-07 DIAGNOSIS — M79609 Pain in unspecified limb: Secondary | ICD-10-CM

## 2012-09-07 DIAGNOSIS — I639 Cerebral infarction, unspecified: Secondary | ICD-10-CM

## 2012-09-07 DIAGNOSIS — M79603 Pain in arm, unspecified: Secondary | ICD-10-CM | POA: Insufficient documentation

## 2012-09-07 DIAGNOSIS — G890 Central pain syndrome: Secondary | ICD-10-CM

## 2012-09-07 DIAGNOSIS — I6350 Cerebral infarction due to unspecified occlusion or stenosis of unspecified cerebral artery: Secondary | ICD-10-CM

## 2012-09-07 DIAGNOSIS — E119 Type 2 diabetes mellitus without complications: Secondary | ICD-10-CM | POA: Insufficient documentation

## 2012-09-07 DIAGNOSIS — F329 Major depressive disorder, single episode, unspecified: Secondary | ICD-10-CM | POA: Insufficient documentation

## 2012-09-07 DIAGNOSIS — F319 Bipolar disorder, unspecified: Secondary | ICD-10-CM | POA: Insufficient documentation

## 2012-09-07 DIAGNOSIS — M79606 Pain in leg, unspecified: Secondary | ICD-10-CM | POA: Insufficient documentation

## 2012-09-07 DIAGNOSIS — R413 Other amnesia: Secondary | ICD-10-CM

## 2012-09-07 NOTE — Progress Notes (Signed)
[facility]  PATIENT: Taylor [name] DOB: 03/01/1981  REFERRING CLINICIAN: Jani Gravel HISTORY FROM: patient and mother REASON FOR VISIT: post stroke pain   HISTORICAL  CHIEF COMPLAINT:  Chief Complaint  Patient presents with  . Cerebrovascular Accident  . Pain    legs, arms    HISTORY OF PRESENT ILLNESS:   32 year old left-handed female with history of hypertension, diabetes, bipolar disorder, depression, anxiety, here for evaluation of pain in the left arm and left leg, memory loss, left leg restlessness.  Patient has long history of mood disorder, Xanax and pain medication abuse, hypertension and diabetes, who was found unresponsive by family members on 05/07/12. Patient was taken to [facility]. She was found to have unresponsiveness, then seizure activity, hypothermic, coffee-ground emesis, melanotic stools, and blood glucose of 1706. Due to critical illness patient was transferred to [facility] medical ICU. Patient had a prolonged and complex hospital course, complicated by a right posterior cerebral artery ischemic infarction as noted on CT of the head. MRI of the brain confirmed right PCA infarction along with bilateral thalamic strokes. Patient was discharged from the hospital 07/05/2012 to skilled nursing facility.  Since that time patient continues to have issues with left arm and left leg pain. She is being treated with hydrocodone/Tylenol with mild relief. Patient frequently asks for higher doses of pain medication. Patient is on Cymbalta for mood disorder, Klonopin for anxiety, Keppra for seizure control.    REVIEW OF SYSTEMS: Full 14 system review of systems performed and notable only for weight loss, fatigue, blurred vision, incontinence, joint pain, aching muscles, memory loss, confusion, headache, numbness, weakness, insomnia and restless legs.  ALLERGIES: Allergies  Allergen Reactions  . Compazine Other (See Comments)    Unknown   . Oxycodone Nausea And Vomiting    Severe patient will not take oxycodone or percocet.  . Prochlorperazine Other (See Comments)    Alert but unable to move  . Promethazine Swelling    Lips swell  . Sumatriptan Rash    HOME MEDICATIONS: Outpatient Prescriptions Prior to Visit  Medication Sig Dispense Refill  . acetaminophen (TYLENOL) 325 MG tablet Take 650 mg by mouth every 6 (six) hours as needed. pain      . aspirin EC 81 MG tablet Take 81 mg by mouth daily.      . clonazePAM (KLONOPIN) 1 MG tablet Take 1 mg by mouth 2 (two) times daily as needed. anxiety      . DULoxetine (CYMBALTA) 60 MG capsule Take 60 mg by mouth daily.        . Fiber TABS Take 2 tablets by mouth daily.      Marland Kitchen HYDROcodone-acetaminophen (NORCO/VICODIN) 5-325 MG per tablet Take 1 tablet by mouth every 6 (six) hours as needed. pain      . labetalol (NORMODYNE) 300 MG tablet Take 300 mg by mouth 2 (two) times daily.      . Lactobacillus (ACIDOPHILUS PO) Take 1 tablet by mouth daily.      Marland Kitchen levETIRAcetam (KEPPRA XR) 500 MG 24 hr tablet Take 1,000 mg by mouth daily.      Marland Kitchen omeprazole (PRILOSEC) 20 MG capsule Take 20 mg by mouth daily.      Marland Kitchen senna (SENOKOT) 8.6 MG tablet Take 1 tablet by mouth at bedtime.      . traMADol (ULTRAM) 50 MG tablet Take 50 mg by mouth once.       No facility-administered medications prior to visit.  PAST MEDICAL HISTORY: Past Medical History  Diagnosis Date  . Diabetes mellitus   . Depression   . Hypertension   . Respiratory failure   . Cerebral artery occlusion with cerebral infarction   . Sepsis   . Bipolar disorder   . Anxiety     PAST SURGICAL HISTORY: Past Surgical History  Procedure Laterality Date  . Appendectomy    . Transrectal drainage of pelvic abscess      FAMILY HISTORY: Family History  Problem Relation Age of Onset  . Lung cancer Maternal Grandmother   . Heart attack Maternal Grandfather   . Diabetes Paternal Grandfather     SOCIAL  HISTORY:  History   Social History  . Marital Status: Single    Spouse Name: N/A    Number of Children: N/A  . Years of Education: N/A   Occupational History  . Not on file.   Social History Main Topics  . Smoking status: Current Every Day Smoker -- 0.60 packs/day    Types: Cigarettes  . Smokeless tobacco: Not on file  . Alcohol Use: No  . Drug Use: No  . Sexually Active: Not Currently    Birth Control/ Protection: None   Other Topics Concern  . Not on file   Social History Narrative   Pt lives in nursing home currently. She is single and does not have any children. She has 2 yrs of college level education. She has 5-6 caffeine drinks daily.      PHYSICAL EXAM  Filed Vitals:   09/07/12 1144  BP: 129/76  Pulse: 83  Temp: 97 F (36.1 C)  TempSrc: Oral  Height: 5\' 4"  (1.626 m)  Weight: 137 lb (62.143 kg)   Body mass index is 23.5 kg/(m^2).  GENERAL EXAM: Patient is in no distress  CARDIOVASCULAR: Regular rate and rhythm, no murmurs, no carotid bruits  NEUROLOGIC: MENTAL STATUS: awake, alert, PAUCITY OF SPEECH, BUT ANSWERS MY QUESTIONS AND FOLLOWS COMMANDS. CRANIAL NERVE: no papilledema on fundoscopic exam, pupils equal and reactive to light, visual fields full to confrontation, extraocular muscles intact, no nystagmus, DECR SENS IN RIGHT FACE, DECR LEFT NL FOLD. Uvula midline, shoulder shrug symmetric, tongue midline. MOTOR: normal bulk and tone, full strength in the RUE, RLE; LIMITED IN LUE AND LLE DUE TO PAIN. SOME DECR FFM IN LEFT HAND. DECR FOOT AP ON LEFT. INCREASED TONE IN LEFT ARM AND LEFT LEG.  SENSORY: DECREASED IN LEFT ARM AND LEFT LEG. COORDINATION: finger-nose-finger, fine finger movements SLOW, WORSE IN LEFT SIDE. REFLEXES: deep tendon reflexes present and symmetric GAIT/STATION: SLOW CAUTIOUS GAIT. STEPPAGE IN LEFT LEG. UNSTEADY.   DIAGNOSTIC DATA (LABS, IMAGING, TESTING) - I reviewed patient records, labs, notes, testing and imaging myself  where available.  Lab Results  Component Value Date   WBC 6.1 07/07/2012   HGB 9.4* 07/07/2012   HCT 28.2* 07/07/2012   MCV 93.4 07/07/2012   PLT 290 07/07/2012      Component Value Date/Time   NA 135 07/07/2012 1651   K 4.0 07/07/2012 1651   CL 100 07/07/2012 1651   CO2 24 07/07/2012 1651   GLUCOSE 123* 07/07/2012 1651   BUN 18 07/07/2012 1651   CREATININE 0.86 07/07/2012 1651   CALCIUM 9.8 07/07/2012 1651   PROT 7.3 03/02/2012 1839   ALBUMIN 3.6 03/02/2012 1839   AST 13 03/02/2012 1839   ALT 7 03/02/2012 1839   ALKPHOS 111 03/02/2012 1839   BILITOT 0.5 03/02/2012 1839   GFRNONAA 88* 07/07/2012 1651  GFRAA >90 07/07/2012 1651   No results found for this basename: CHOL, HDL, LDLCALC, LDLDIRECT, TRIG, CHOLHDL   No results found for this basename: HGBA1C   No results found for this basename: VITAMINB12   No results found for this basename: TSH   05/29/12 MRI brain ([facility] report) - images not available for me to review. 1. Evolving right PCA territory infarct with hemorrhagic transformation. Evolving bilateral thalamic infarcts. No new acute infarct is identified. 2. Thin 1 to 2 mm extra axial collection over the left parieto-occipital region may represent subdural hemorrhage or complex effusion.   ASSESSMENT AND PLAN  32 y.o. year old female  has a past medical history of Diabetes mellitus; Depression; Hypertension; Respiratory failure; Cerebral artery occlusion with cerebral infarction; Sepsis; Bipolar disorder; and Anxiety. here with persistent left sided pain and memory loss following complex hospital course (hyperosmolar nonketotic state, sirs, sepsis,retroperitoneal abscess, acute renal failure, ventilator dependent respiratory failure, pneumonia, right occipital and bilateral thalamic strokes).  I will check MRI of the brain to rule out any ongoing acute neurological processes. I suspect her symptoms are sequelae from her prior strokes and medical issues. May consider trial of gabapentin  300 mg at night and increase to 3 times a day for symptom control (I would ask her PCP to consider initiating this medication if medically appropriate). I think she would benefit from long-term pain management clinic evaluation in coordination with her psychiatrist.  Orders Placed This Encounter  Procedures  . MR Brain Wo Contrast    Penni Bombard, MD [id number], [id number] PM Certified in Neurology, Neurophysiology and Neuroimaging  [facility] [address] [place], [place] [address] [phone]

## 2012-09-07 NOTE — Patient Instructions (Addendum)
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  - I will ask PCP to start gabapentin 300mg  qhs and increase to three times a day if medically appropriate - I will check follow up MRI brain scan - may need to consider pain mgmt clinic referral for long term narcotics if appropriate (considering mood d/o and prior medication abuse history)

## 2012-09-18 ENCOUNTER — Other Ambulatory Visit (INDEPENDENT_AMBULATORY_CARE_PROVIDER_SITE_OTHER): Payer: Medicaid Other | Admitting: Diagnostic Neuroimaging

## 2012-09-18 DIAGNOSIS — G890 Central pain syndrome: Secondary | ICD-10-CM

## 2012-09-18 DIAGNOSIS — R413 Other amnesia: Secondary | ICD-10-CM

## 2012-09-18 DIAGNOSIS — I6350 Cerebral infarction due to unspecified occlusion or stenosis of unspecified cerebral artery: Secondary | ICD-10-CM

## 2012-09-18 DIAGNOSIS — I639 Cerebral infarction, unspecified: Secondary | ICD-10-CM

## 2012-09-18 NOTE — Procedures (Signed)
[facility]  NEUROIMAGING REPORT   STUDY DATE: 09/17/12 PATIENT NAME: Taylor [name] DOB: 11/22/1980 MRN: [id number]  ORDERING CLINICIAN: Andrey Spearman, MD  CLINICAL HISTORY: 32 year old female with post-stroke pain.  EXAM: MRI brain (without)  TECHNIQUE: MRI of the brain without contrast was obtained utilizing 5 mm axial slices with T1, T2, T2 flair, T2 star gradient echo and diffusion weighted views.  T1 sagittal and T2 coronal views were obtained. CONTRAST: no IMAGING SITE: [facility]   FINDINGS:  Subacute-chronic right posterior cerebral artery ischemic infarction (DWI hyperintense, ADC isointense, T2FLAIR hyperintense), with encephalomalacia and gliosis in the right occipital lobe and bilateral thalami. Mild chronic microvascular ischemic changes in the pons and subcortical white matter. Cortical laminar necrosis in the right occipital gyri.  Lateral, third and fourth ventricle are normal in size and appearance. No extra-axial fluid collections are seen. No evidence of mass effect or midline shift.    On sagittal views the posterior fossa, pituitary gland and corpus callosum are unremarkable. No evidence of intracranial hemorrhage on gradient-echo views. The orbits and their contents, paranasal sinuses and calvarium are unremarkable.  Intracranial flow voids are present.  IMPRESSION:  Abnormal MRI brain (without) demonstrating: 1. 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 2. Mild chronic microvascular ischemic changes in the pons and subcortical white matter. 3. No acute findings.   INTERPRETING PHYSICIAN:  Penni Bombard, MD Certified in Neurology, Neurophysiology and Neuroimaging  [facility] [address] [place], [place] [address] [phone]

## 2012-09-28 ENCOUNTER — Telehealth: Payer: Self-pay

## 2012-09-28 NOTE — Telephone Encounter (Signed)
Called pt, gave mother MRI results. Went over last OV plans, and informed pain mgmt vs. Gabapentin should be handled via PCP per Dr. Leta Baptist. Mother states pt recently visited w/ PCP who put pt on Gabapentin 300mg  @ hs and decreased Hydrocodone to q8h. Pt is tolerating Gabapentin well, but not the Hydrocodone decrease. Has upcoming PCP visit on 5/5/4, will observe and discuss w/ PCP then if additional pain mgmt needed.

## 2012-10-11 ENCOUNTER — Telehealth: Payer: Self-pay | Admitting: Diagnostic Neuroimaging

## 2012-11-19 DIAGNOSIS — Z0289 Encounter for other administrative examinations: Secondary | ICD-10-CM

## 2012-12-21 NOTE — Telephone Encounter (Signed)
Message forward to Triage [facility]

## 2013-06-03 ENCOUNTER — Encounter: Payer: Medicaid Other | Admitting: Advanced Practice Midwife

## 2013-06-11 ENCOUNTER — Encounter: Payer: Medicaid Other | Admitting: Advanced Practice Midwife

## 2013-12-15 IMAGING — CR DG SHOULDER 2+V*L*
3 series · 3 of 3 positions shown · non-contrast
Comparison: None.

CLINICAL DATA: Pain

LEFT SHOULDER - 2+ VIEW

[view not recorded (1 of 3)]
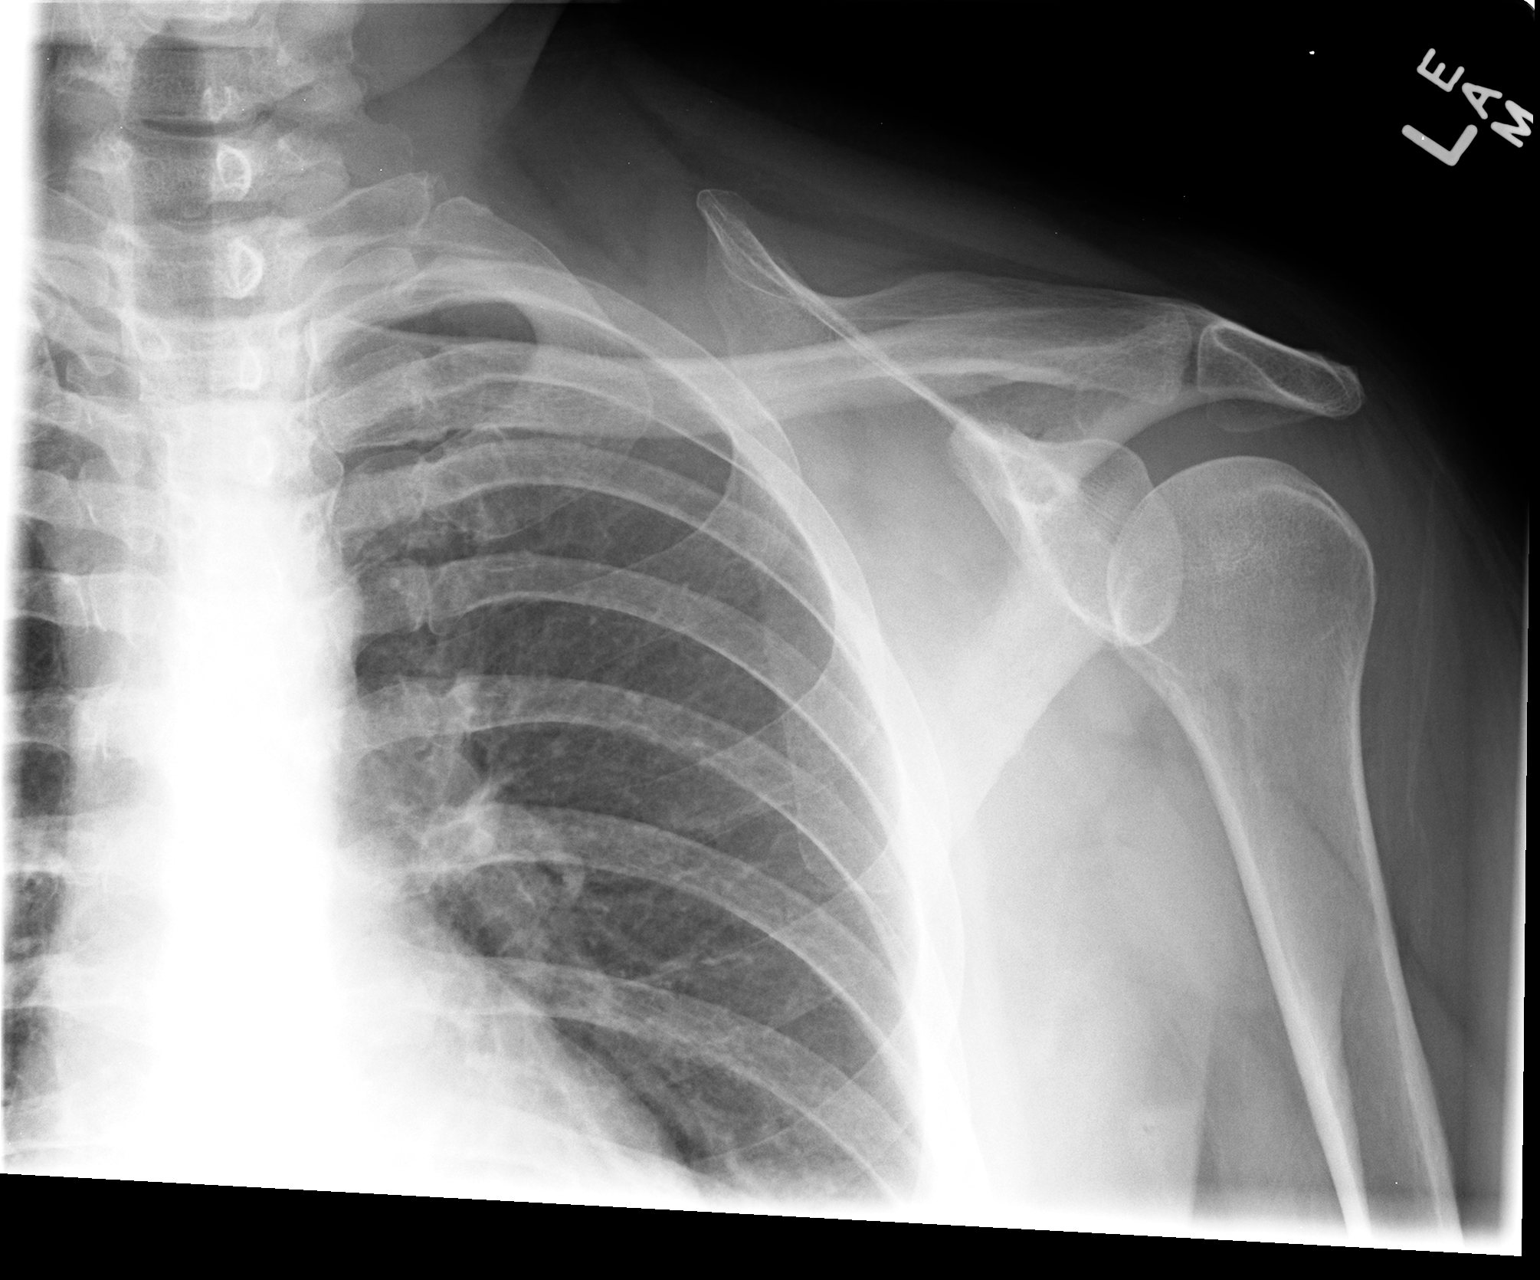

[view not recorded (2 of 3)]
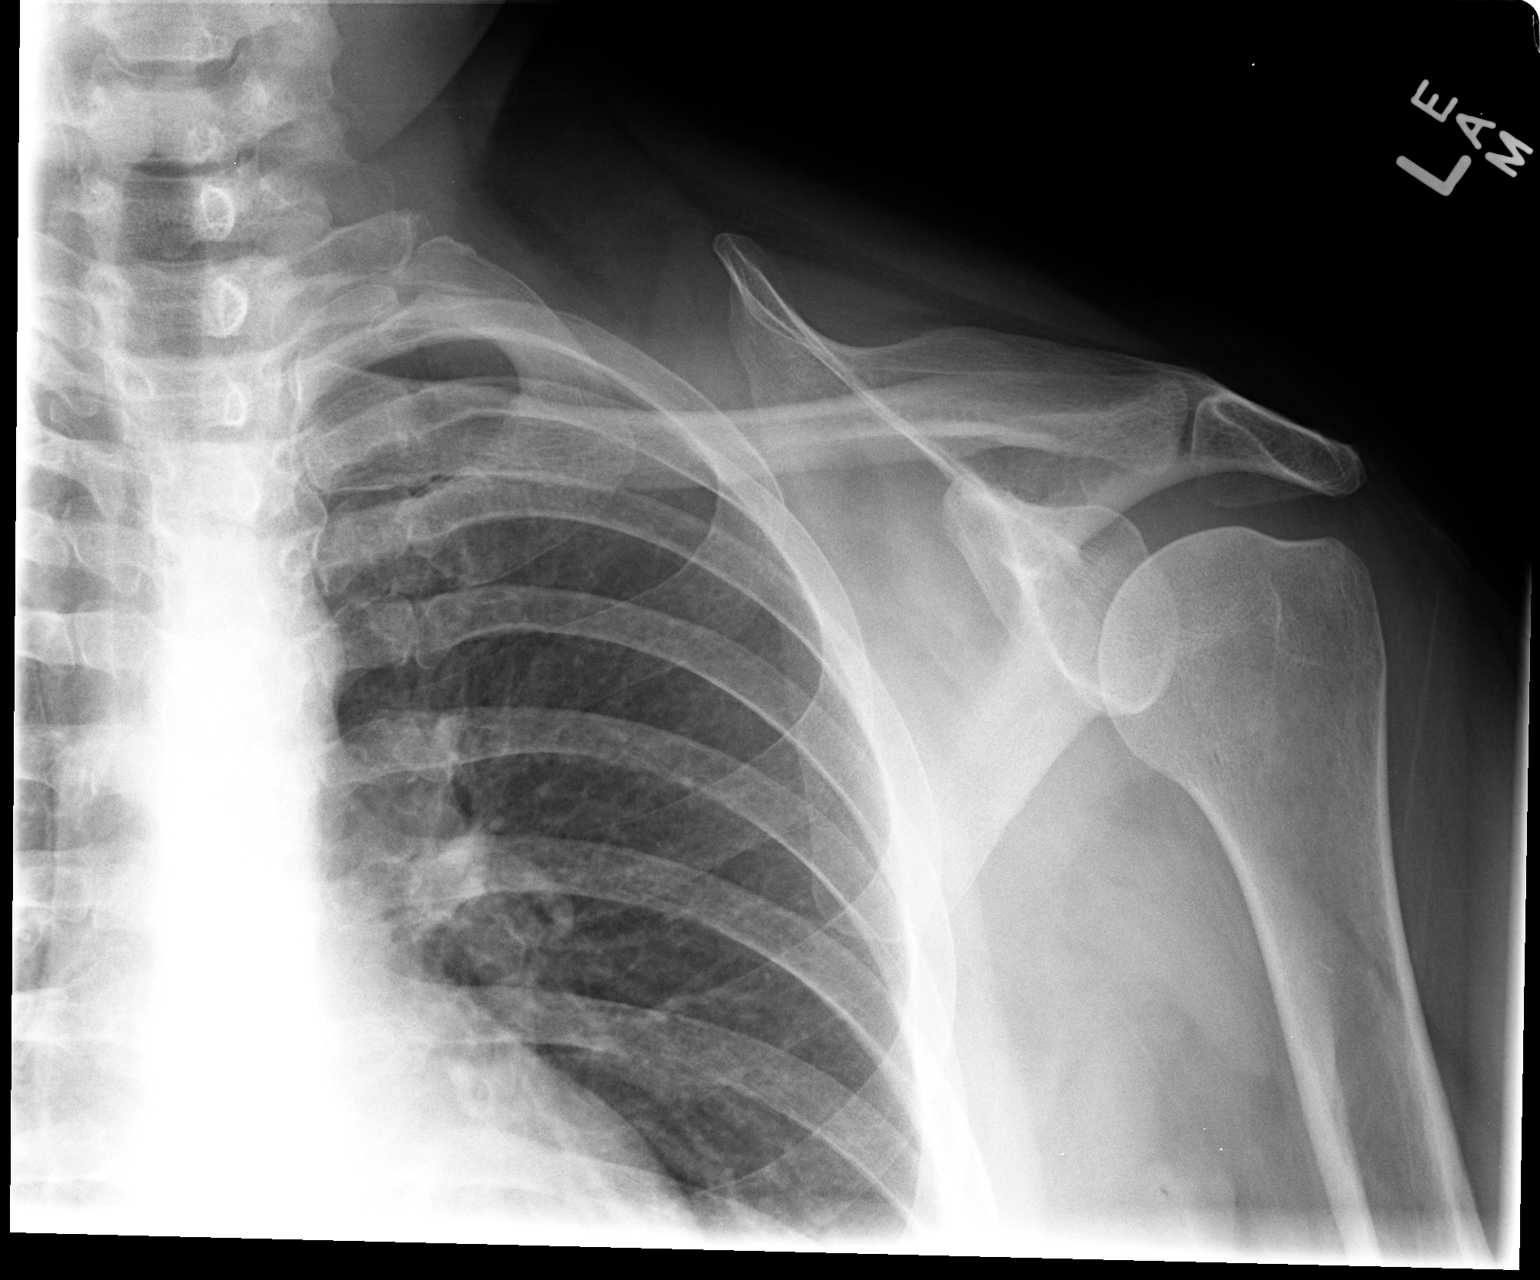

[view not recorded (3 of 3)]
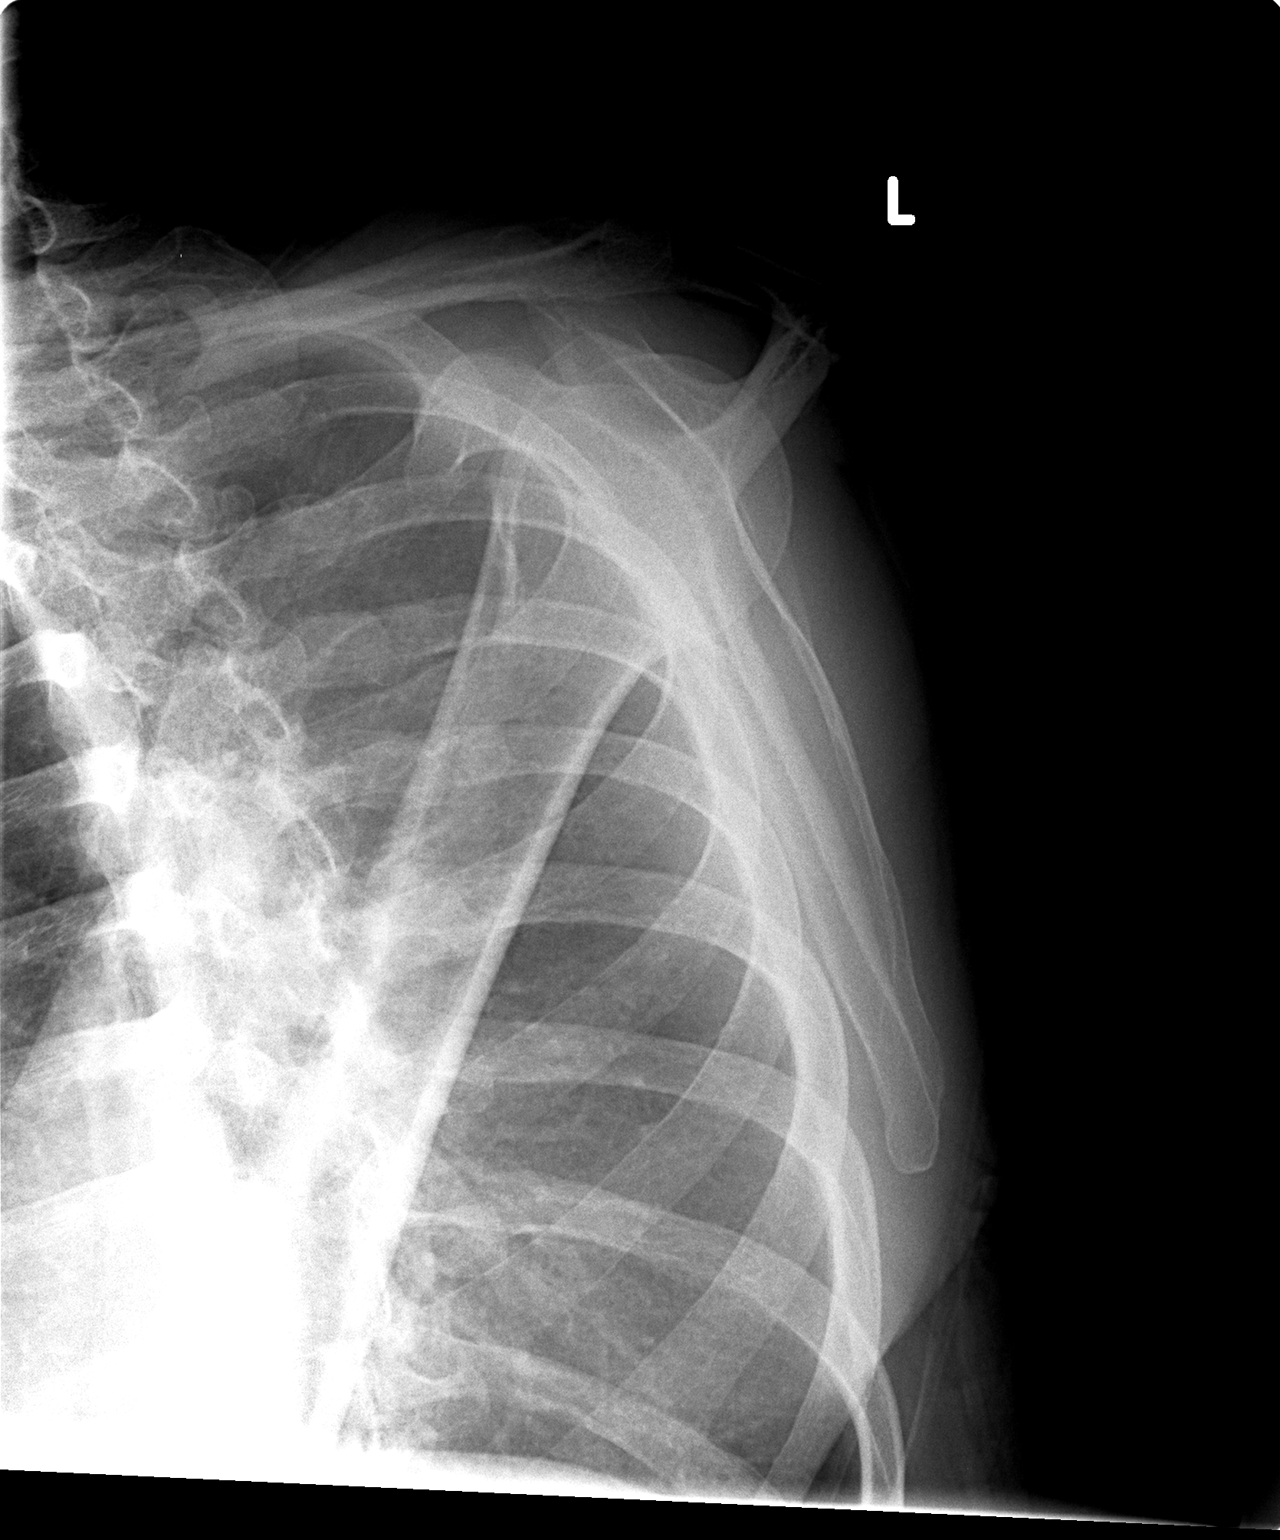

[3 of 3 positions shown; findings below may reference images not displayed]

FINDINGS: Negative for fracture, dislocation, or other acute
abnormality.  Normal alignment and mineralization. No significant
degenerative change.  Regional soft tissues unremarkable.
IMPRESSION: Negative

## 2013-12-15 IMAGING — CR DG RIBS W/ CHEST 3+V*R*
4 series · 4 of 4 positions shown · non-contrast
Comparison: 05/18/2011

CLINICAL DATA: Chest and rib pain

RIGHT RIBS AND CHEST - 3+ VIEW

[view not recorded (1 of 4)]
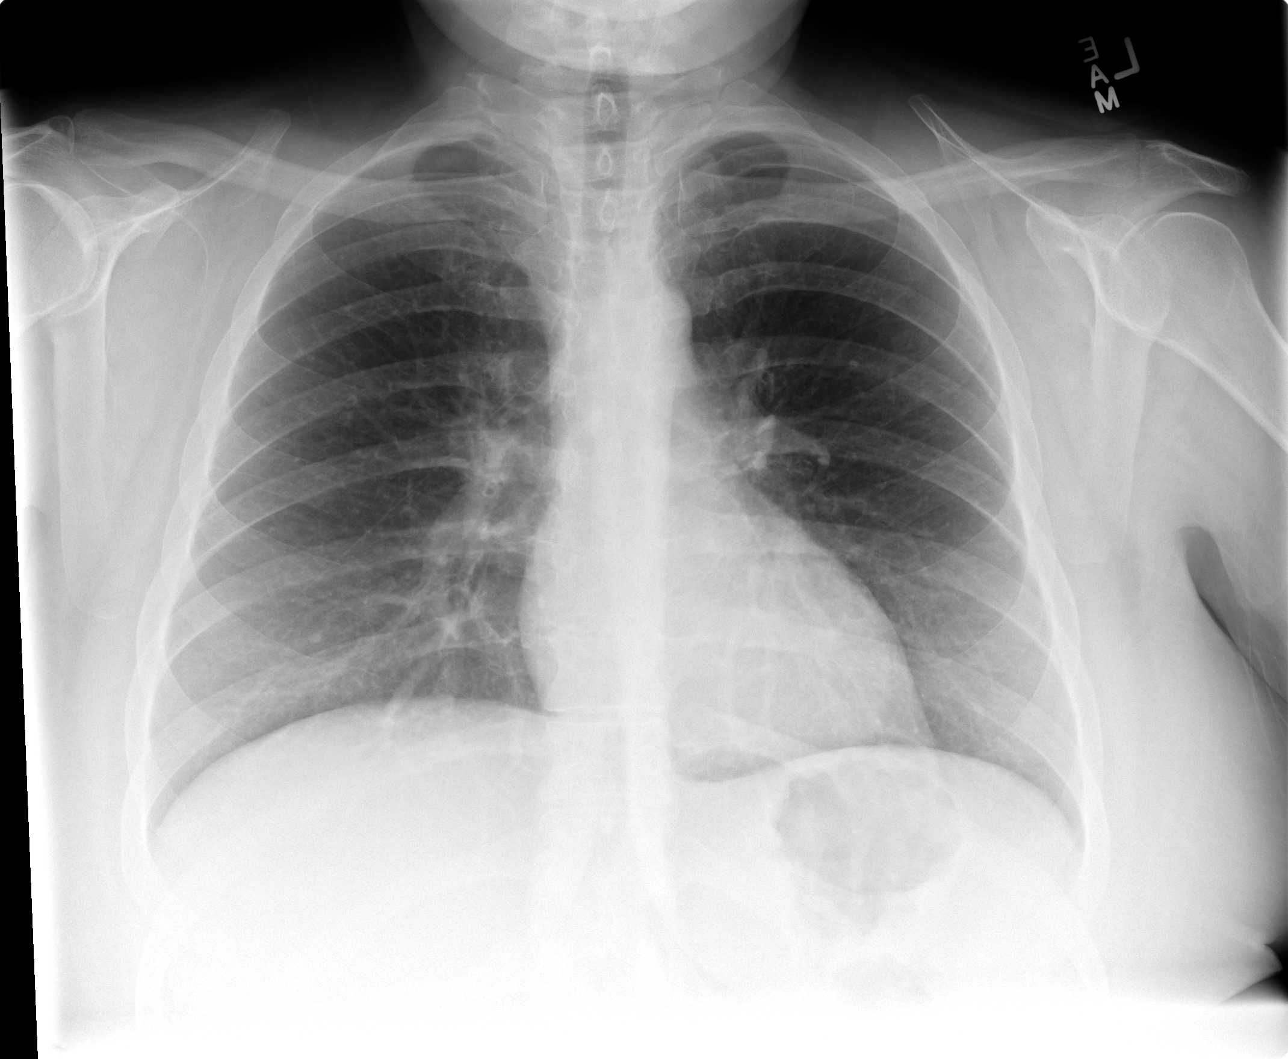

[view not recorded (2 of 4)]
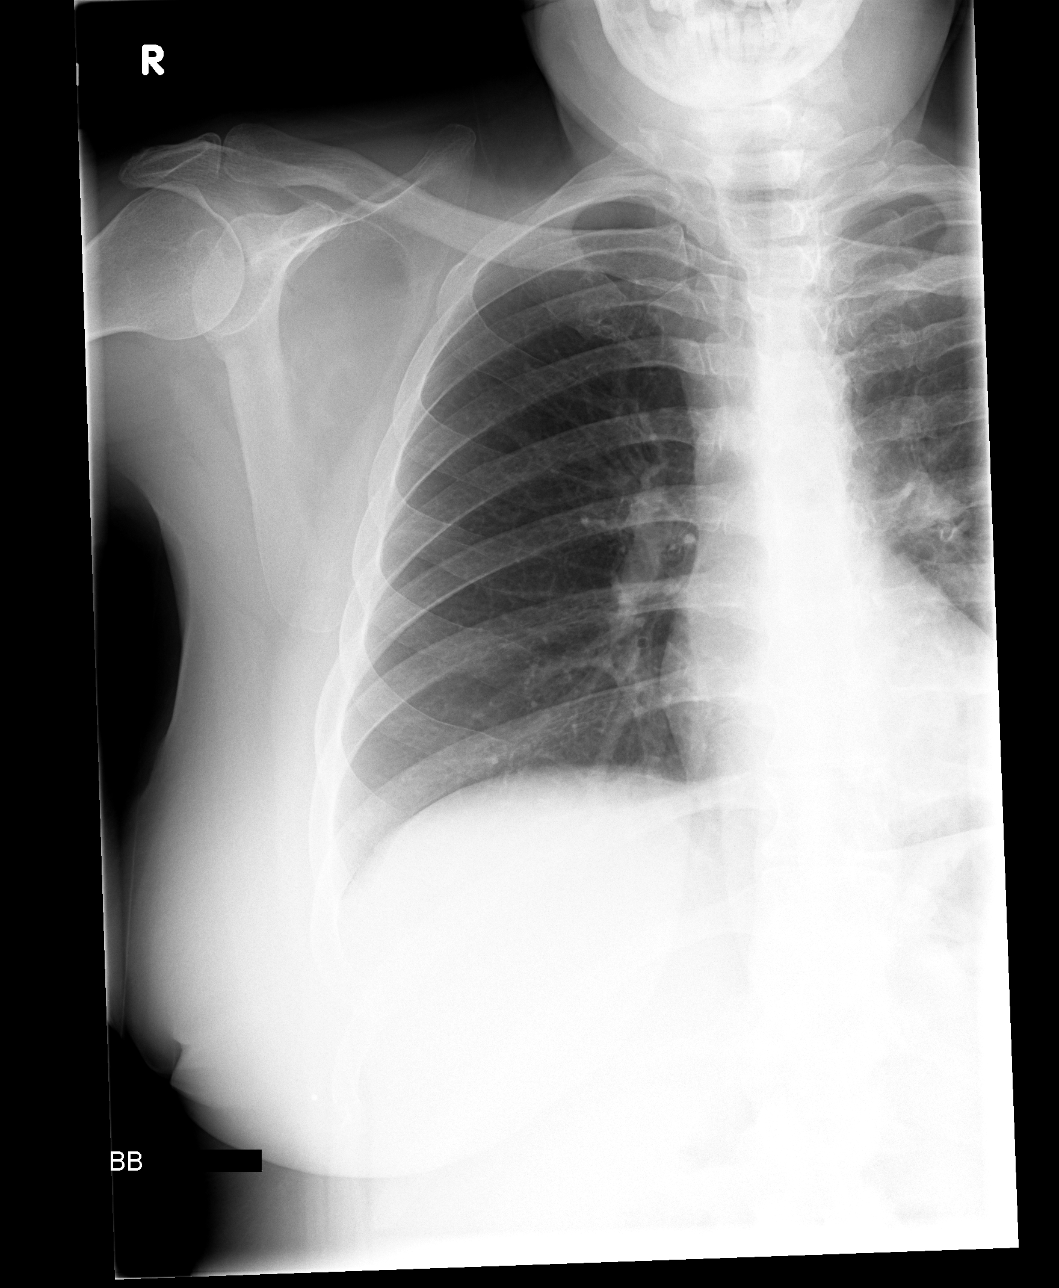

[view not recorded (3 of 4)]
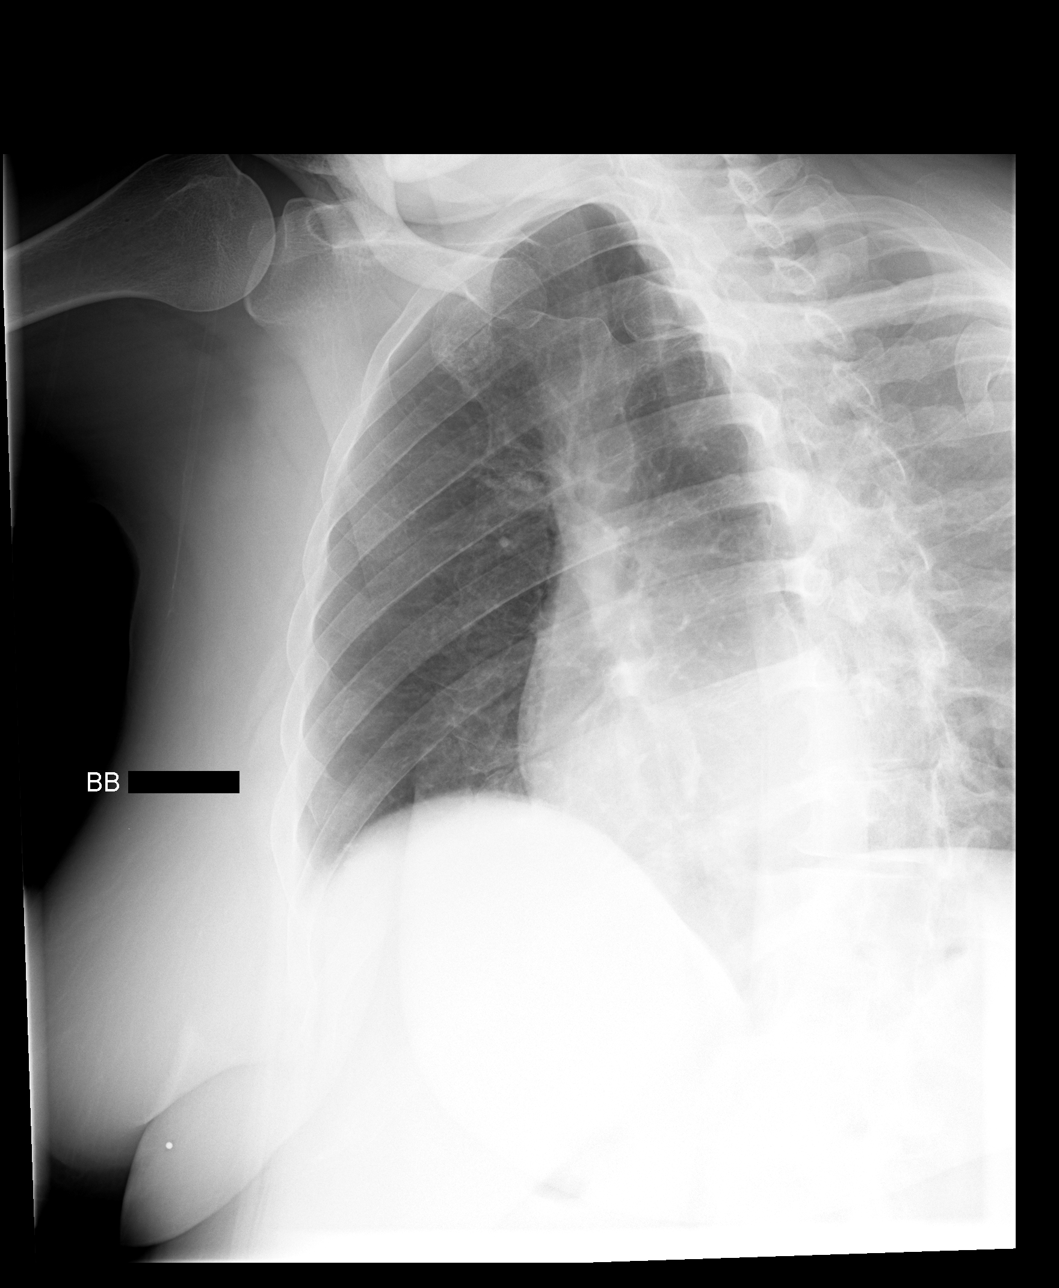

[view not recorded (4 of 4)]
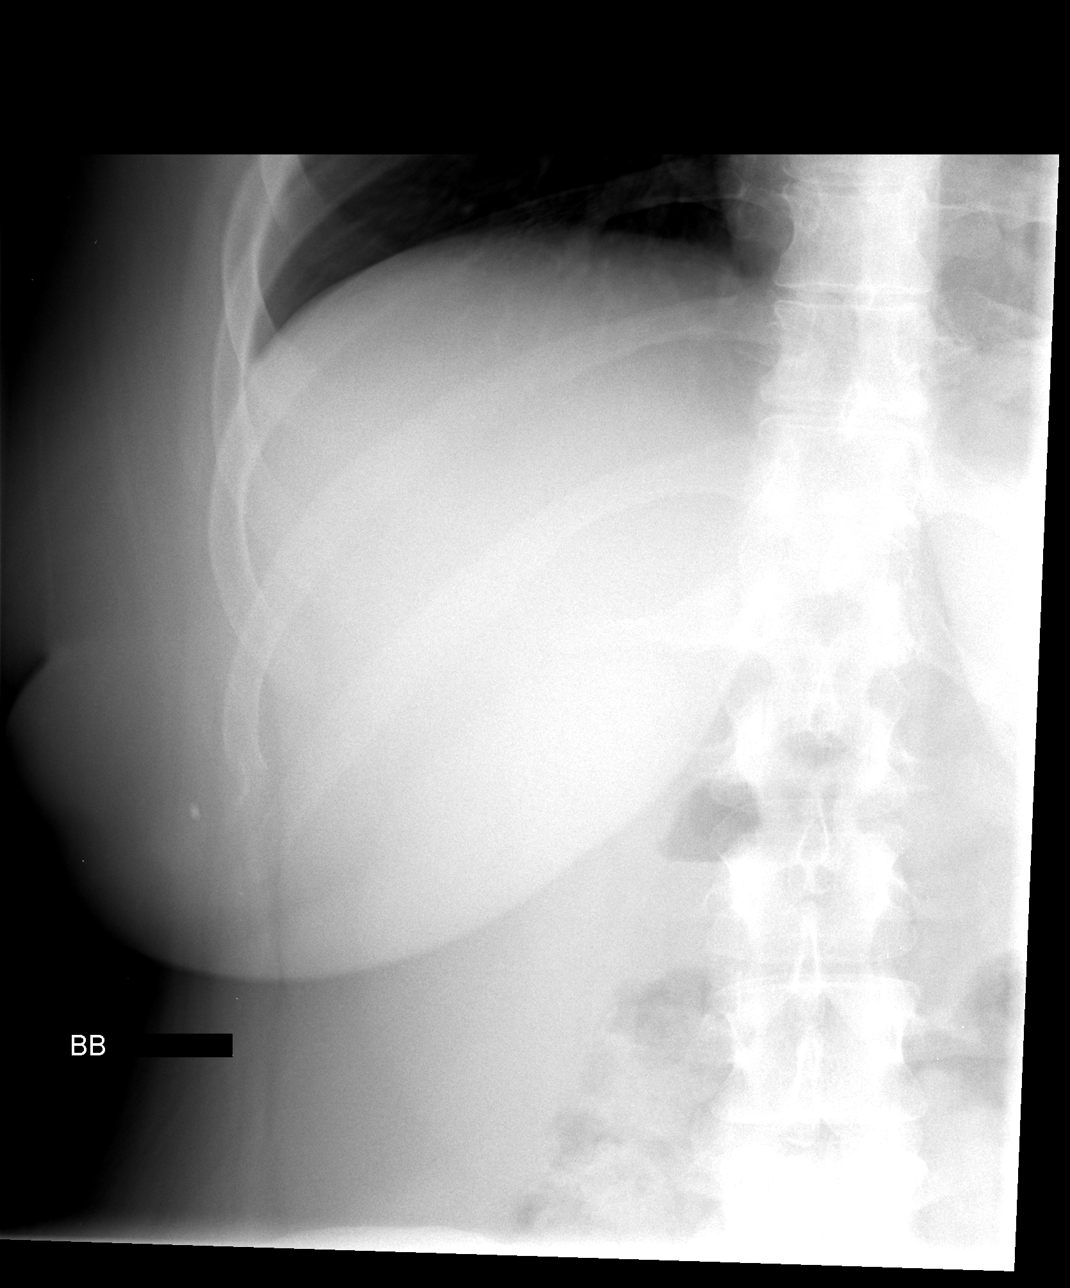

[4 of 4 positions shown; findings below may reference images not displayed]

FINDINGS: No pneumothorax or effusion.  Lungs clear.  Heart size
normal.  Detailed views show no displaced right rib fracture or
other focal lesion.
IMPRESSION: Negative

## 2014-02-10 ENCOUNTER — Encounter (HOSPITAL_COMMUNITY): Payer: Self-pay | Admitting: Pharmacy Technician

## 2014-02-18 NOTE — Pre-Procedure Instructions (Addendum)
Taylor [name]  02/18/2014   Your procedure is scheduled on:  02/24/14  Report to [facility] short stay admitting at 530 AM.  Call this number if you have problems the morning of surgery: [phone]   Remember:   Do not eat food or drink liquids after midnight.   Take these medicines the morning of surgery with A SIP OF WATER: pain med if needed,valium,cymbalta, labetalol,keppra, omeprazole      Take all meds as ordered until day of surgery except as instructed below or per dr      Taylor [name] all herbel meds, nsaids (aleve,naproxen,advil,ibuprofen) 5 days prior to surgery(02/19/14) including vitamins, aspirin, naproxen,diclofenac        Do not wear jewelry, make-up or nail polish.  Do not wear lotions, powders, or perfumes. You may wear deodorant.  Do not shave 48 hours prior to surgery. Men may shave face and neck.  Do not bring valuables to the hospital.  [facility] is not responsible                  for any belongings or valuables.               Contacts, dentures or bridgework may not be worn into surgery.  Leave suitcase in the car. After surgery it may be brought to your room.  For patients admitted to the hospital, discharge time is determined by your                treatment team.               Patients discharged the day of surgery will not be allowed to drive  home.  Name and phone number of your driver:   Special Instructions:  Special Instructions: [facility] - Preparing for Surgery  Before surgery, you can play an important role.  Because skin is not sterile, your skin needs to be as free of germs as possible.  You can reduce the number of germs on you skin by washing with CHG (chlorahexidine gluconate) soap before surgery.  CHG is an antiseptic cleaner which kills germs and bonds with the skin to continue killing germs even after washing.  Please DO NOT use if you have an allergy to CHG or antibacterial soaps.  If your skin becomes reddened/irritated stop using the CHG and  inform your nurse when you arrive at Short Stay.  Do not shave (including legs and underarms) for at least 48 hours prior to the first CHG shower.  You may shave your face.  Please follow these instructions carefully:   1.  Shower with CHG Soap the night before surgery and the morning of Surgery.  2.  If you choose to wash your hair, wash your hair first as usual with your normal shampoo.  3.  After you shampoo, rinse your hair and body thoroughly to remove the Shampoo.  4.  Use CHG as you would any other liquid soap.  You can apply chg directly  to the skin and wash gently with scrungie or a clean washcloth.  5.  Apply the CHG Soap to your body ONLY FROM THE NECK DOWN.  Do not use on open wounds or open sores.  Avoid contact with your eyes ears, mouth and genitals (private parts).  Wash genitals (private parts)       with your normal soap.  6.  Wash thoroughly, paying special attention to the area where your surgery will be performed.  7.  Thoroughly  rinse your body with warm water from the neck down.  8.  DO NOT shower/wash with your normal soap after using and rinsing off the CHG Soap.  9.  Pat yourself dry with a clean towel.            10.  Wear clean pajamas.            11.  Place clean sheets on your bed the night of your first shower and do not sleep with pets.  Day of Surgery  Do not apply any lotions/deodorants the morning of surgery.  Please wear clean clothes to the hospital/surgery center.   Please read over the following fact sheets that you were given: Pain Booklet, Coughing and Deep Breathing and Surgical Site Infection Prevention

## 2014-02-19 ENCOUNTER — Encounter (HOSPITAL_COMMUNITY)
Admission: RE | Admit: 2014-02-19 | Discharge: 2014-02-19 | Disposition: A | Discharge status: Home / Self Care | Payer: Medicaid Other | Source: Ambulatory Visit | Attending: Oral Surgery | Admitting: Oral Surgery

## 2014-02-19 ENCOUNTER — Encounter (HOSPITAL_COMMUNITY)
Admission: RE | Admit: 2014-02-19 | Discharge: 2014-02-19 | Disposition: A | Discharge status: Home / Self Care | Payer: Medicaid Other | Source: Ambulatory Visit | Attending: Vascular Surgery | Admitting: Vascular Surgery

## 2014-02-19 ENCOUNTER — Encounter (HOSPITAL_COMMUNITY): Payer: Self-pay

## 2014-02-19 ENCOUNTER — Ambulatory Visit (HOSPITAL_COMMUNITY): Admission: RE | Admit: 2014-02-19 | Payer: Medicaid Other | Source: Ambulatory Visit

## 2014-02-19 DIAGNOSIS — K1370 Unspecified lesions of oral mucosa: Secondary | ICD-10-CM | POA: Diagnosis not present

## 2014-02-19 DIAGNOSIS — K029 Dental caries, unspecified: Secondary | ICD-10-CM | POA: Diagnosis not present

## 2014-02-19 DIAGNOSIS — Z01810 Encounter for preprocedural cardiovascular examination: Secondary | ICD-10-CM | POA: Diagnosis present

## 2014-02-19 DIAGNOSIS — Z01812 Encounter for preprocedural laboratory examination: Secondary | ICD-10-CM | POA: Diagnosis not present

## 2014-02-19 HISTORY — DX: Reserved for inherently not codable concepts without codable children: IMO0001

## 2014-02-19 HISTORY — DX: Unspecified convulsions: R56.9

## 2014-02-19 HISTORY — DX: Headache: R51

## 2014-02-19 HISTORY — DX: Other diseases of vocal cords: J38.3

## 2014-02-19 LAB — BASIC METABOLIC PANEL
ANION GAP: 14 (ref 5–15)
BUN: 16 mg/dL (ref 6–23)
CALCIUM: 9.3 mg/dL (ref 8.4–10.5)
CO2: 21 mEq/L (ref 19–32)
Chloride: 106 mEq/L (ref 96–112)
Creatinine, Ser: 0.66 mg/dL (ref 0.50–1.10)
GFR calc non Af Amer: >90 mL/min (ref >90)
Glucose, Bld: 98 mg/dL (ref 70–99)
Potassium: 4.3 mEq/L (ref 3.7–5.3)
Sodium: 141 mEq/L (ref 137–147)

## 2014-02-19 LAB — CBC
HCT: 37.1 % (ref 36.0–46.0)
Hemoglobin: 12.1 g/dL (ref 12.0–15.0)
MCH: 30.2 pg (ref 26.0–34.0)
MCHC: 32.6 g/dL (ref 30.0–36.0)
MCV: 92.5 fL (ref 78.0–100.0)
Platelets: 254 10*3/uL (ref 150–400)
RBC: 4.01 MIL/uL (ref 3.87–5.11)
RDW: 12.3 % (ref 11.5–15.5)
WBC: 9.4 10*3/uL (ref 4.0–10.5)

## 2014-02-19 LAB — HCG, SERUM, QUALITATIVE: PREG SERUM: NEGATIVE

## 2014-02-19 NOTE — Progress Notes (Signed)
Anesthesia PAT Evaluation:  Patient is a 33 year old female scheduled for multiple teeth extractions with alveoloplasty and biopsy on 02/24/14 by Dr. Hoyt Koch.  History includes smoking, HTN, diet controlled DM2, Bipolar disorder, depression, anxiety, headaches, pelvic abscess s/p drainage, appendectomy. In 05/07/12, she was found at home unresponsive with a bottle of Xanax beside her, coffee ground emesis, and no heat in the house.  She was taken to [facility] and found to be hypothermic, hyperglycemic (1706) and developed seizure activity.  She was intubated and transferred to MICU at [facility]. Abdominal CT showed retroperitoneal abscess s/p drainage.  Head CT on 05/10/12 showed new right PCA infarction. She was not discharged until [id number] and had a complicated hospital course including prolonged VDRF requiring tracheostomy and acute renal failure requiring hemodialysis. Tracheostomy was out, and she was off hemodialysis prior to her discharge.  She still has residual dysphagia (no special diet, but has to sit upright, chew well, and turn her head to the side to help with swallowing) and mild left sided hemiparesis. BMI is consistent with mild obesity. She is a resident at [facility] in [place].  PCP is Dominica Severin, NP-C from [facility] who makes "home" visits at the group facility.  Patient is here today with her mother.    EKG on 02/19/14 showed: NSR with sinus arrhythmia, non-specific T wave abnormality.  TEE on 05/14/12 (Care Everywhere) showed: The aortic, mitral and tricuspid valves were well visualized. They appeared to be structurally normal and no vegetations were seen. The pulmonic valve was not as well visualized but no vegetation was appreciated. There was no significant valvular regurgitation involving any of the valves. The left and right ventricular size and function was normal. The left atrium and left atrial appendage was visualized without evidence of spontaneous echo  contrast  or thrombus. The remainder of the study was unremarkable. (Saline bubble study on 05/10/12 showed no right to left shunt.)  CXR on 02/19/14 showed no acute cardiopulmonary abnormality.  Post CVA MRI on 09/17/12 showed:  Abnormal MRI brain (without) demonstrating:  1. 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  2. Mild chronic microvascular ischemic changes in the pons and subcortical white matter.  3. No acute findings.   CTA of the head and neck on 05/10/12 (Care Everywhere) showed: 1. Similar area of hypoattenuation in the right PCA territory as detailed above, consistent with recent infarct. Limited local mass effect but no significant midline shift. No evidence of acute hemorrhagic transformation. No hydrocephalus. 2. Narrowing of the P2 the segment of the right PCA. General irregularity of the bilateral posterior cerebral arteries. Paucity of vascularity in the right PCA territory. 3. 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 4. Left lower lobe airspace opacities concerning for pneumonia. 5. Non-occlusive atherosclerotic plaque in the proximal left internal carotid artery.  Preoperative labs noted.  On exam, patient is alert, cooperative.  Her mouth is somewhat small (3 FB).  Tracheostomy scar.  Heart RRR, no murmur noted.  Lungs clear. No stridor. No pre-tibial edema.    Mom has concerns about anesthesia--airway with patient's history of prior tracheostomy.  I was told patient is having around 16 teeth extracted. We discussed case was posted for GA, so that would be anticipated. Smaller ETT may be needed.  Specialized equipment such as glidescope or fiberoptic scope may be needed for better visualization.  Patient did not want to hear any more details, but I did notify  her mother that sometimes nasal tube is needed.  Patient tends to get anxious, so this may need to be treated while in [place]. She only wants  to know the necessary details--nothing more. She is a first case so hopefully case will get started in a timely fashion.  Patient will be accompanied by her parents on the day of surgery.  They understand that they will meet with her assigned anesthesiologist that morning who will discuss the definitive anesthesia plan and answer additional questions as needed.    George [facility] Stay [facility]/Anesthesiology Phone [phone] 02/19/2014 4:24 PM

## 2014-02-19 NOTE — Anesthesia Preprocedure Evaluation (Addendum)
Anesthesia Evaluation  Patient identified by MRN, date of birth, ID band Patient awake    Reviewed: Allergy & Precautions, H&P , NPO status , Patient's Chart, lab work & pertinent test results  Airway Mallampati: II TM Distance: >3 FB Neck ROM: Full    Dental  (+) Poor Dentition, Dental Advisory Given, Missing,    Pulmonary Current Smoker,  Tracheostomy two years ago for prolonged intubation and ventilation 2nd to pneumonia related to stroke and losss of conciousness breath sounds clear to auscultation        Cardiovascular hypertension, Pt. on medications Rhythm:Regular Rate:Normal     Neuro/Psych Anxiety Depression Bipolar Disorder Ischemic infarct 2013 has some residual CVA, Residual Symptoms    GI/Hepatic   Endo/Other  diabetes, Poorly Controlled, Type 2, Insulin DependentDM poorly controlled two years ago when she had her stroke and required prolonged intubation and teacheostomy  Renal/GU    HCG neg4 days ago    Musculoskeletal   Abdominal (+)  Abdomen: soft.    Peds  Hematology   Anesthesia Other Findings   Reproductive/Obstetrics                         Anesthesia Physical Anesthesia Plan  ASA: III  Anesthesia Plan: General   Post-op Pain Management:    Induction: Intravenous  Airway Management Planned: Oral ETT and Nasal ETT  Additional Equipment:   Intra-op Plan:   Post-operative Plan: Extubation in OR  Informed Consent: I have reviewed the patients History and Physical, chart, labs and discussed the procedure including the risks, benefits and alternatives for the proposed anesthesia with the patient or authorized representative who has indicated his/her understanding and acceptance.     Plan Discussed with:   Anesthesia Plan Comments: (See my anesthesia note.  History of tracheostomy 04/2012, now decannulated. Problems with dysphagia. Prone to anxiety. Only wants to  hear the necessary details.  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

## 2014-02-19 NOTE — H&P (Signed)
HISTORY AND PHYSICAL  Taylor [name] is a 33 y.o. female patient with CC: painful teeth  No diagnosis found.  Past Medical History  Diagnosis Date  . Depression   . Hypertension   . Respiratory failure 11/13    with stroke - in hosp [facility] 12 weeks  . Sepsis   . Bipolar disorder   . Anxiety   . Cerebral artery occlusion with cerebral infarction 11/13    due to elevated blood sugar 1700-off insulin 6 months  . Diabetes mellitus     off insulin 6 months  . Kidney dialysis status     on dialysis(stroke) off after 4-6 weeks  . Seizures     during elvated blood sugsr episode 11/13  . Headache(784.0)     No current facility-administered medications for this encounter.   Current Outpatient Prescriptions  Medication Sig Dispense Refill  . acetaminophen (TYLENOL) 325 MG tablet Take 650 mg by mouth every 6 (six) hours as needed. pain      . aspirin EC 81 MG tablet Take 81 mg by mouth daily.      . butalbital-acetaminophen-caffeine (FIORICET, ESGIC) 50-325-40 MG per tablet Take 2 tablets by mouth every 4 (four) hours as needed for headache.      . diazepam (VALIUM) 10 MG tablet Take 10 mg by mouth 2 (two) times daily.      . diclofenac sodium (VOLTAREN) 1 % GEL Apply 1 application topically 4 (four) times daily.      . DULoxetine (CYMBALTA) 30 MG capsule Take 30 mg by mouth daily.      . Fiber TABS Take 2 tablets by mouth daily. 625 mg      . fluticasone (FLONASE) 50 MCG/ACT nasal spray Place 1 spray into both nostrils 2 (two) times daily.      Marland Kitchen gabapentin (NEURONTIN) 100 MG capsule Take 100 mg by mouth 3 (three) times daily.      Marland Kitchen gabapentin (NEURONTIN) 300 MG capsule Take 300 mg by mouth 3 (three) times daily.      Marland Kitchen HYDROcodone-acetaminophen (NORCO/VICODIN) 5-325 MG per tablet Take 1 tablet by mouth every 6 (six) hours as needed. pain      . insulin NPH (HUMULIN N,NOVOLIN N) 100 UNIT/ML injection Inject 22 Units into the skin daily with dinner.      Marland Kitchen ketorolac (TORADOL) 10 MG tablet  Take 10 mg by mouth 2 (two) times daily.      Marland Kitchen labetalol (NORMODYNE) 300 MG tablet Take 300 mg by mouth 2 (two) times daily.      . Lactobacillus (ACIDOPHILUS PO) Take 1 tablet by mouth daily.      Marland Kitchen levETIRAcetam (KEPPRA XR) 500 MG 24 hr tablet Take 1,000 mg by mouth daily.      Marland Kitchen lisinopril (PRINIVIL,ZESTRIL) 20 MG tablet Take 20 mg by mouth daily.      Marland Kitchen loratadine (CLARITIN) 10 MG tablet Take 10 mg by mouth daily.      . naproxen (NAPROSYN) 500 MG tablet Take 500 mg by mouth 2 (two) times daily with a meal.      . omeprazole (PRILOSEC) 20 MG capsule Take 20 mg by mouth daily.      . risperiDONE (RISPERDAL) 1 MG tablet Take 1 mg by mouth at bedtime.      . traZODone (DESYREL) 100 MG tablet Take 100 mg by mouth at bedtime.       Allergies  Allergen Reactions  . Compazine Other (See Comments)    Unknown  .  Oxycodone Nausea And Vomiting    Severe patient will not take oxycodone or percocet.  . Prochlorperazine Other (See Comments)    Alert but unable to move  . Promethazine Swelling    Lips swell  . Sumatriptan Rash   Active Problems:   * No active hospital problems. *  Vitals: There were no vitals taken for this visit. Lab results:No results found for this or any previous visit (from the past 23 hour(s)). Radiology Results: No results found. General appearance: alert and cooperative Head: Normocephalic, without obvious abnormality, atraumatic Eyes: negative Nose: Nares normal. Septum midline. Mucosa normal. No drainage or sinus tenderness. Throat: dental caries/ periodontal disease. Pharynx clear. Neck: no adenopathy, supple, symmetrical, trachea midline and thyroid not enlarged, symmetric, no tenderness/mass/nodules Resp: clear to auscultation bilaterally Cardio: regular rate and rhythm, S1, S2 normal, no murmur, click, rub or gallop  Assessment:Non-restorable teeth due to dental caries, periodonititis.   Plan:Extraction teeth 2, 3, 4, 5, 8, 9, 11, 12, 14, 16, 20, 21, 22,  23, 24, 25, 26, 27, alveoloplasty. General anesthesia. Day surgery.   Taylor [name] M 02/19/2014

## 2014-02-23 MED ORDER — CEFAZOLIN SODIUM-DEXTROSE 2-3 GM-% IV SOLR
2.0000 g | INTRAVENOUS | Status: AC
  Administered 2014-02-24: 2 g via INTRAVENOUS
  Filled 2014-02-23: qty 50

## 2014-02-24 ENCOUNTER — Encounter (HOSPITAL_COMMUNITY): Payer: Medicaid Other | Admitting: Vascular Surgery

## 2014-02-24 ENCOUNTER — Ambulatory Visit (HOSPITAL_COMMUNITY)
Admission: RE | Admit: 2014-02-24 | Discharge: 2014-02-24 | Disposition: A | Discharge status: Home / Self Care | Payer: Medicaid Other | Source: Ambulatory Visit | Attending: Oral Surgery | Admitting: Oral Surgery

## 2014-02-24 ENCOUNTER — Encounter (HOSPITAL_COMMUNITY)
Admission: RE | Disposition: A | Discharge status: Home / Self Care | Payer: Self-pay | Source: Ambulatory Visit | Attending: Oral Surgery

## 2014-02-24 ENCOUNTER — Ambulatory Visit (HOSPITAL_COMMUNITY): Payer: Medicaid Other | Admitting: Anesthesiology

## 2014-02-24 ENCOUNTER — Encounter (HOSPITAL_COMMUNITY): Payer: Self-pay | Admitting: Anesthesiology

## 2014-02-24 DIAGNOSIS — Z992 Dependence on renal dialysis: Secondary | ICD-10-CM | POA: Insufficient documentation

## 2014-02-24 DIAGNOSIS — F341 Dysthymic disorder: Secondary | ICD-10-CM | POA: Insufficient documentation

## 2014-02-24 DIAGNOSIS — I6350 Cerebral infarction due to unspecified occlusion or stenosis of unspecified cerebral artery: Secondary | ICD-10-CM

## 2014-02-24 DIAGNOSIS — I10 Essential (primary) hypertension: Secondary | ICD-10-CM | POA: Diagnosis not present

## 2014-02-24 DIAGNOSIS — F319 Bipolar disorder, unspecified: Secondary | ICD-10-CM | POA: Diagnosis not present

## 2014-02-24 DIAGNOSIS — I639 Cerebral infarction, unspecified: Secondary | ICD-10-CM

## 2014-02-24 DIAGNOSIS — Z8673 Personal history of transient ischemic attack (TIA), and cerebral infarction without residual deficits: Secondary | ICD-10-CM | POA: Diagnosis not present

## 2014-02-24 DIAGNOSIS — F329 Major depressive disorder, single episode, unspecified: Secondary | ICD-10-CM

## 2014-02-24 DIAGNOSIS — R413 Other amnesia: Secondary | ICD-10-CM

## 2014-02-24 DIAGNOSIS — L851 Acquired keratosis [keratoderma] palmaris et plantaris: Secondary | ICD-10-CM | POA: Insufficient documentation

## 2014-02-24 DIAGNOSIS — Z9119 Patient's noncompliance with other medical treatment and regimen: Secondary | ICD-10-CM

## 2014-02-24 DIAGNOSIS — M79603 Pain in arm, unspecified: Secondary | ICD-10-CM

## 2014-02-24 DIAGNOSIS — K029 Dental caries, unspecified: Secondary | ICD-10-CM | POA: Insufficient documentation

## 2014-02-24 DIAGNOSIS — F17200 Nicotine dependence, unspecified, uncomplicated: Secondary | ICD-10-CM | POA: Insufficient documentation

## 2014-02-24 DIAGNOSIS — M79606 Pain in leg, unspecified: Secondary | ICD-10-CM

## 2014-02-24 DIAGNOSIS — K0530 Chronic periodontitis, unspecified: Secondary | ICD-10-CM | POA: Diagnosis not present

## 2014-02-24 DIAGNOSIS — G890 Central pain syndrome: Secondary | ICD-10-CM

## 2014-02-24 DIAGNOSIS — E119 Type 2 diabetes mellitus without complications: Secondary | ICD-10-CM | POA: Insufficient documentation

## 2014-02-24 DIAGNOSIS — Z91199 Patient's noncompliance with other medical treatment and regimen due to unspecified reason: Secondary | ICD-10-CM

## 2014-02-24 DIAGNOSIS — F32A Depression, unspecified: Secondary | ICD-10-CM

## 2014-02-24 HISTORY — PX: MULTIPLE EXTRACTIONS WITH ALVEOLOPLASTY: SHX5342

## 2014-02-24 LAB — GLUCOSE, CAPILLARY
GLUCOSE-CAPILLARY: 124 mg/dL — AB (ref 70–99)
Glucose-Capillary: 164 mg/dL — ABNORMAL HIGH (ref 70–99)

## 2014-02-24 SURGERY — MULTIPLE EXTRACTION WITH ALVEOLOPLASTY
Anesthesia: General | Site: Mouth

## 2014-02-24 MED ORDER — MIDAZOLAM HCL 2 MG/2ML IJ SOLN
INTRAMUSCULAR | Status: AC
  Filled 2014-02-24: qty 2

## 2014-02-24 MED ORDER — SODIUM CHLORIDE 0.9 % IR SOLN
Status: DC | PRN
  Administered 2014-02-24: 1000 mL

## 2014-02-24 MED ORDER — FENTANYL CITRATE 0.05 MG/ML IJ SOLN
INTRAMUSCULAR | Status: AC
  Filled 2014-02-24: qty 5

## 2014-02-24 MED ORDER — ONDANSETRON HCL 4 MG/2ML IJ SOLN
INTRAMUSCULAR | Status: AC
  Filled 2014-02-24: qty 2

## 2014-02-24 MED ORDER — SUCCINYLCHOLINE CHLORIDE 20 MG/ML IJ SOLN
INTRAMUSCULAR | Status: DC | PRN
  Administered 2014-02-24: 100 mg via INTRAVENOUS

## 2014-02-24 MED ORDER — AMOXICILLIN 500 MG PO CAPS: 500.0000 mg | ORAL_CAPSULE | Freq: Three times a day (TID) | ORAL | Status: DC

## 2014-02-24 MED ORDER — OXYCODONE-ACETAMINOPHEN 5-325 MG PO TABS
ORAL_TABLET | ORAL | Status: DC
Start: 2014-02-24 — End: 2014-02-24
  Filled 2014-02-24: qty 1

## 2014-02-24 MED ORDER — LIDOCAINE-EPINEPHRINE 2 %-1:100000 IJ SOLN
INTRAMUSCULAR | Status: AC
  Filled 2014-02-24: qty 1

## 2014-02-24 MED ORDER — LIDOCAINE-EPINEPHRINE 2 %-1:100000 IJ SOLN
INTRAMUSCULAR | Status: DC | PRN
  Administered 2014-02-24: 20 mL via INTRADERMAL

## 2014-02-24 MED ORDER — HYDROCODONE-ACETAMINOPHEN 10-325 MG PO TABS: 1.0000 | ORAL_TABLET | Freq: Four times a day (QID) | ORAL | Status: DC | PRN

## 2014-02-24 MED ORDER — FENTANYL CITRATE 0.05 MG/ML IJ SOLN
INTRAMUSCULAR | Status: DC | PRN
  Administered 2014-02-24 (×3): 50 ug via INTRAVENOUS

## 2014-02-24 MED ORDER — LIDOCAINE HCL (CARDIAC) 20 MG/ML IV SOLN
INTRAVENOUS | Status: DC | PRN
  Administered 2014-02-24: 100 mg via INTRAVENOUS

## 2014-02-24 MED ORDER — ONDANSETRON HCL 4 MG/2ML IJ SOLN
INTRAMUSCULAR | Status: DC | PRN
  Administered 2014-02-24: 4 mg via INTRAVENOUS

## 2014-02-24 MED ORDER — PROPOFOL 10 MG/ML IV BOLUS
INTRAVENOUS | Status: AC
  Filled 2014-02-24: qty 20

## 2014-02-24 MED ORDER — NEOSTIGMINE METHYLSULFATE 10 MG/10ML IV SOLN
INTRAVENOUS | Status: DC | PRN
  Administered 2014-02-24: 4 mg via INTRAVENOUS

## 2014-02-24 MED ORDER — FENTANYL CITRATE 0.05 MG/ML IJ SOLN: 25.0000 ug | INTRAMUSCULAR | Status: DC | PRN

## 2014-02-24 MED ORDER — BUPIVACAINE-EPINEPHRINE (PF) 0.5% -1:200000 IJ SOLN
INTRAMUSCULAR | Status: AC
  Filled 2014-02-24: qty 10.8

## 2014-02-24 MED ORDER — PROPOFOL 10 MG/ML IV BOLUS
INTRAVENOUS | Status: DC | PRN
  Administered 2014-02-24: 150 mg via INTRAVENOUS

## 2014-02-24 MED ORDER — ROCURONIUM BROMIDE 100 MG/10ML IV SOLN
INTRAVENOUS | Status: DC | PRN
  Administered 2014-02-24: 25 mg via INTRAVENOUS

## 2014-02-24 MED ORDER — SODIUM CHLORIDE 0.9 % IJ SOLN
INTRAMUSCULAR | Status: AC
  Filled 2014-02-24: qty 10

## 2014-02-24 MED ORDER — ARTIFICIAL TEARS OP OINT
TOPICAL_OINTMENT | OPHTHALMIC | Status: AC
  Filled 2014-02-24: qty 3.5

## 2014-02-24 MED ORDER — ARTIFICIAL TEARS OP OINT
TOPICAL_OINTMENT | OPHTHALMIC | Status: DC | PRN
  Administered 2014-02-24: 1 via OPHTHALMIC

## 2014-02-24 MED ORDER — LIDOCAINE-EPINEPHRINE 2 %-1:100000 IJ SOLN
INTRAMUSCULAR | Status: AC
  Filled 2014-02-24: qty 10.2

## 2014-02-24 MED ORDER — LACTATED RINGERS IV SOLN
INTRAVENOUS | Status: DC | PRN
  Administered 2014-02-24: 07:00:00 via INTRAVENOUS

## 2014-02-24 MED ORDER — FENTANYL CITRATE 0.05 MG/ML IJ SOLN
INTRAMUSCULAR | Status: DC
Start: 2014-02-24 — End: 2014-02-24
  Filled 2014-02-24: qty 2

## 2014-02-24 MED ORDER — ROCURONIUM BROMIDE 50 MG/5ML IV SOLN
INTRAVENOUS | Status: AC
  Filled 2014-02-24: qty 1

## 2014-02-24 MED ORDER — OXYMETAZOLINE HCL 0.05 % NA SOLN
NASAL | Status: AC
Start: 2014-02-24 — End: 2014-02-24
  Filled 2014-02-24: qty 15

## 2014-02-24 MED ORDER — GLYCOPYRROLATE 0.2 MG/ML IJ SOLN
INTRAMUSCULAR | Status: DC | PRN
  Administered 2014-02-24: 0.6 mg via INTRAVENOUS

## 2014-02-24 MED ORDER — OXYMETAZOLINE HCL 0.05 % NA SOLN
NASAL | Status: AC
  Filled 2014-02-24: qty 15

## 2014-02-24 MED ORDER — NEOSTIGMINE METHYLSULFATE 10 MG/10ML IV SOLN
INTRAVENOUS | Status: AC
  Filled 2014-02-24: qty 1

## 2014-02-24 MED ORDER — PHENYLEPHRINE 40 MCG/ML (10ML) SYRINGE FOR IV PUSH (FOR BLOOD PRESSURE SUPPORT)
PREFILLED_SYRINGE | INTRAVENOUS | Status: AC
  Filled 2014-02-24: qty 10

## 2014-02-24 MED ORDER — EPHEDRINE SULFATE 50 MG/ML IJ SOLN
INTRAMUSCULAR | Status: AC
  Filled 2014-02-24: qty 1

## 2014-02-24 MED ORDER — ACETAMINOPHEN 10 MG/ML IV SOLN
INTRAVENOUS | Status: AC
  Administered 2014-02-24: 1000 mg via INTRAVENOUS
  Filled 2014-02-24: qty 100

## 2014-02-24 MED ORDER — MIDAZOLAM HCL 5 MG/5ML IJ SOLN
INTRAMUSCULAR | Status: DC | PRN
  Administered 2014-02-24 (×2): 1 mg via INTRAVENOUS

## 2014-02-24 MED ORDER — GLYCOPYRROLATE 0.2 MG/ML IJ SOLN
INTRAMUSCULAR | Status: AC
  Filled 2014-02-24: qty 2

## 2014-02-24 MED ORDER — SUCCINYLCHOLINE CHLORIDE 20 MG/ML IJ SOLN
INTRAMUSCULAR | Status: AC
  Filled 2014-02-24: qty 1

## 2014-02-24 MED ORDER — 0.9 % SODIUM CHLORIDE (POUR BTL) OPTIME
TOPICAL | Status: DC | PRN
  Administered 2014-02-24: 1000 mL

## 2014-02-24 MED ORDER — OXYCODONE-ACETAMINOPHEN 5-325 MG PO TABS: 1.0000 | ORAL_TABLET | Freq: Once | ORAL | Status: DC

## 2014-02-24 MED ORDER — MEPERIDINE HCL 25 MG/ML IJ SOLN: 6.2500 mg | INTRAMUSCULAR | Status: DC | PRN

## 2014-02-24 SURGICAL SUPPLY — 32 items
BLADE 10 SAFETY STRL DISP (BLADE) ×3 IMPLANT
BUR CROSS CUT FISSURE 1.6 (BURR) ×2 IMPLANT
BUR CROSS CUT FISSURE 1.6MM (BURR) ×1
BUR EGG ELITE 4.0 (BURR) ×2 IMPLANT
BUR EGG ELITE 4.0MM (BURR) ×1
CANISTER SUCTION 2500CC (MISCELLANEOUS) ×3 IMPLANT
CONT SPECI 4OZ STER CLIK (MISCELLANEOUS) IMPLANT
COVER SURGICAL LIGHT HANDLE (MISCELLANEOUS) ×3 IMPLANT
CRADLE DONUT ADULT HEAD (MISCELLANEOUS) ×3 IMPLANT
GAUZE PACKING FOLDED 2  STR (GAUZE/BANDAGES/DRESSINGS) ×2
GAUZE PACKING FOLDED 2 STR (GAUZE/BANDAGES/DRESSINGS) ×1 IMPLANT
GLOVE BIO SURGEON STRL SZ 6.5 (GLOVE) ×4 IMPLANT
GLOVE BIO SURGEON STRL SZ7.5 (GLOVE) ×3 IMPLANT
GLOVE BIO SURGEONS STRL SZ 6.5 (GLOVE) ×2
GLOVE BIOGEL PI IND STRL 7.0 (GLOVE) ×2 IMPLANT
GLOVE BIOGEL PI INDICATOR 7.0 (GLOVE) ×4
GOWN STRL REUS W/ TWL LRG LVL3 (GOWN DISPOSABLE) ×2 IMPLANT
GOWN STRL REUS W/ TWL XL LVL3 (GOWN DISPOSABLE) ×1 IMPLANT
GOWN STRL REUS W/TWL LRG LVL3 (GOWN DISPOSABLE) ×6
GOWN STRL REUS W/TWL XL LVL3 (GOWN DISPOSABLE) ×3
KIT BASIN OR (CUSTOM PROCEDURE TRAY) ×3 IMPLANT
KIT ROOM TURNOVER OR (KITS) ×3 IMPLANT
NEEDLE 22X1 1/2 (OR ONLY) (NEEDLE) ×3 IMPLANT
NS IRRIG 1000ML POUR BTL (IV SOLUTION) ×3 IMPLANT
PAD ARMBOARD 7.5X6 YLW CONV (MISCELLANEOUS) ×6 IMPLANT
SUT CHROMIC 3 0 PS 2 (SUTURE) ×6 IMPLANT
SUT CHROMIC 4 0 P 3 18 (SUTURE) ×2 IMPLANT
SYR CONTROL 10ML LL (SYRINGE) ×3 IMPLANT
TOWEL OR 17X26 10 PK STRL BLUE (TOWEL DISPOSABLE) ×3 IMPLANT
TRAY ENT MC OR (CUSTOM PROCEDURE TRAY) ×3 IMPLANT
TUBING IRRIGATION (MISCELLANEOUS) ×3 IMPLANT
YANKAUER SUCT BULB TIP NO VENT (SUCTIONS) ×5 IMPLANT

## 2014-02-24 NOTE — Op Note (Signed)
02/24/2014  8:23 AM  PATIENT:  Taylor [name]  33 y.o. female  PRE-OPERATIVE DIAGNOSIS:  non restorable teeth #s 2, 3, 4, 5, 8, 9, 11, 12, 14, 16, 20, 21, 22, 23, 24, 25, 26, 27, lesion in floor of the mouth  POST-OPERATIVE DIAGNOSIS:  SAME + non-restorable teeth #'s 6, 10, 15  PROCEDURE:  Procedure(s): MULTIPLE EXTRACTION teeth #s 2, 3, 4, 5, 6, 8, 9, 10, 11, 12, 14, 15, 16, 20, 21, 22, 23, 24, 25, 26, 27WITH ALVEOLOPLASTY AND BIOPSY  SURGEON:  Surgeon(s): Gae Bon, DDS  ANESTHESIA:   local and general  EBL:  minimal  DRAINS: none   SPECIMEN:  No Specimen  COUNTS:  YES  PLAN OF CARE: Discharge to home after PACU  PATIENT DISPOSITION:  PACU - hemodynamically stable.   PROCEDURE DETAILS: Dictation #  [id number] Gae Bon, DMD 02/24/2014 8:23 AM

## 2014-02-24 NOTE — Transfer of Care (Signed)
Immediate Anesthesia Transfer of Care Note  Patient: Taylor [name]  Procedure(s) Performed: Procedure(s): MULTIPLE EXTRACTION WITH ALVEOLOPLASTY AND BIOPSY (N/A)  Patient Location: PACU  Anesthesia Type:General  Level of Consciousness: awake, alert  and oriented  Airway & Oxygen Therapy: Patient Spontanous Breathing and Patient connected to face mask oxygen  Post-op Assessment: Report given to PACU RN  Post vital signs: Reviewed and stable  Complications: No apparent anesthesia complications

## 2014-02-24 NOTE — H&P (Signed)
H&P documentation  -History and Physical Reviewed  -Patient has been re-examined  -No change in the plan of care  Taylor [name] M  

## 2014-02-24 NOTE — Progress Notes (Signed)
May give percocet per Dr Tresa Moore.  Pt states her allergy is Nausea, and is ok with taking this med

## 2014-02-24 NOTE — Anesthesia Postprocedure Evaluation (Signed)
  Anesthesia Post-op Note  Patient: Taylor [name]  Procedure(s) Performed: Procedure(s): MULTIPLE EXTRACTION WITH ALVEOLOPLASTY AND BIOPSY (N/A)  Patient Location: PACU  Anesthesia Type:General  Level of Consciousness: awake, alert  and oriented  Airway and Oxygen Therapy: Patient Spontanous Breathing and Patient connected to nasal cannula oxygen  Post-op Pain: mild  Post-op Assessment: Post-op Vital signs reviewed, Patient's Cardiovascular Status Stable, Respiratory Function Stable, Patent Airway and No signs of Nausea or vomiting  Post-op Vital Signs: Reviewed and stable  Last Vitals:  Filed Vitals:   02/24/14 0845  BP: 166/111  Pulse: 84  Temp:   Resp:     Complications: No apparent anesthesia complications

## 2014-02-24 NOTE — Op Note (Signed)
NAME:  Taylor [name], Taylor [name]                 ACCOUNT NO.:  [id number]  MEDICAL RECORD NO.:  [id number]  LOCATION:  [facility]                         FACILITY:  [facility]  PHYSICIAN:  Gae Bon, M.D.  DATE OF BIRTH:  [birth year]  DATE OF PROCEDURE:  02/24/2014 DATE OF DISCHARGE:                              OPERATIVE REPORT   PREOPERATIVE DIAGNOSIS:  Nonrestorable teeth numbers 2, 3, 4, 5, 8, 9, 11, 12, 14, 16, 20, 21, 22, 23, 24, 25, 26, 27, secondary to dental caries and periodontal disease lesion, anterior floor of the mouth.  POSTOPERATIVE DIAGNOSES: 1. Nonrestorable teeth numbers 2, 3, 4, 5, 8, 9, 11, 12, 14, 16, 20,     21, 22, 23, 24, 25, 26, 27, secondary to dental caries and     periodontal disease lesion, anterior floor of the mouth. 2. Nonrestorable teeth numbers 6, 10, and 15.  PROCEDURE: 1. Extraction of teeth numbers 2, 3, 4, 5, 6, 8, 9, 10, 11, 12, 14,     15, 16, 20, 21, 22, 23, 24, 25, 26, 27. 2. Alveoplasty, right and left maxilla, right and left mandible biopsy     lesion anterior floor of the mouth.  SURGEON:  Gae Bon, M.D.  ANESTHESIA:  General oral intubation.  PROCEDURE:  The patient was taken to the operating room, placed on the table in supine position.  General anesthesia was administered intravenously and an oral endotracheal tube was placed.  The time-out was performed and the patient was draped for the procedure.  The oropharynx was suctioned and the throat pack was placed.  Then, 2% lidocaine with 1:100,000 epinephrine was infiltrated in an inferior alveolar block on the right and left side and in buccal infiltration in the maxilla and mandible.  The biopsy was performed first.  A sweetheart retractor was placed to retract the tongue and a bite block was placed on the right side of the mouth.  Then, a 15 blade was used to make an incision elliptically in nature in the anterior floor of the mouth proximal to Wharton's ducts approximately 1 cm x 0.5 cm.   The tissue was submitted in formalin for diagnosis and then the area was closed with 4- 0 chromic.  Then, the 15 blade was used to make an incision beginning at tooth #22 carrying anteriorly and across the midline to tooth #27 on both buccal and lingual surfaces at the gingival sulcus in the mandible. Then in the maxilla, the 15 blade was used to make an incision beginning at tooth #16 and carrying across the midline to tooth #6 on the buccal and palatal surfaces in the gingival sulcus.  The periosteum was then reflected in the maxilla and mandible.  The teeth were elevated and removed from the mouth with a dental forceps.  Tooth #15 was found to have a cervical decay on the palatal and buccal surfaces with the periodontal compromise so this tooth was removed as was tooth #10. Tooth #6 was also removed using the upper universal forceps as were all the upper teeth.  The lower teeth were removed with Asch forceps.  Then, the sockets were curetted.  A small periapical cysts were  removed and then the periosteum was reflected to expose the alveolar crest in the maxilla and mandible and alveoplasty was performed using the egg-shaped bur and bone file.  Alveoplasty was necessary because when the teeth were removed, the bone is irregular and will not properly support denture with good retention and seal.  Then, the sweetheart and bite block were repositioned to the other side of the mouth.  The 15 blade was used to make an incision at teeth numbers 2, 3, 4, and 5.  The periosteum was reflected.  The teeth were elevated with a 301 elevator and removed with the upper universal forceps.  An alveoplasty was performed using the egg-shaped bur and bone file.  The sockets were then curetted, irrigated, and closed with 3-0 chromic.  Then, the oral cavity was irrigated and suctioned.  The oral cavity was inspected and found to have good contour, hemostasis, and closure.  The throat pack was removed.  The  patient was awakened and taken to the recovery room, breathing spontaneously in good condition.  ESTIMATED BLOOD LOSS:  Minimal.  COMPLICATIONS:  None.  SPECIMENS:  None.     Gae Bon, M.D.     SMJ/MEDQ  D:  02/24/2014  T:  02/24/2014  Job:  [id number]

## 2014-02-24 NOTE — Discharge Instructions (Signed)
What to eat: ° °For your first meals, you should eat lightly; only small meals initially.  If you do not have nausea, you may eat larger meals.  Avoid spicy, greasy and heavy food.   ° °General Anesthesia, Adult, Care After  °Refer to this sheet in the next few weeks. These instructions provide you with information on caring for yourself after your procedure. Your health care provider may also give you more specific instructions. Your treatment has been planned according to current medical practices, but problems sometimes occur. Call your health care provider if you have any problems or questions after your procedure.  °WHAT TO EXPECT AFTER THE PROCEDURE  °After the procedure, it is typical to experience:  °Sleepiness.  °Nausea and vomiting. °HOME CARE INSTRUCTIONS  °For the first 24 hours after general anesthesia:  °Have a responsible person with you.  °Do not drive a car. If you are alone, do not take public transportation.  °Do not drink alcohol.  °Do not take medicine that has not been prescribed by your health care provider.  °Do not sign important papers or make important decisions.  °You may resume a normal diet and activities as directed by your health care provider.  °Change bandages (dressings) as directed.  °If you have questions or problems that seem related to general anesthesia, call the hospital and ask for the anesthetist or anesthesiologist on call. °SEEK MEDICAL CARE IF:  °You have nausea and vomiting that continue the day after anesthesia.  °You develop a rash. °SEEK IMMEDIATE MEDICAL CARE IF:  °You have difficulty breathing.  °You have chest pain.  °You have any allergic problems. °Document Released: 09/05/2000 Document Revised: 01/30/2013 Document Reviewed: 12/13/2012  °ExitCare® Patient Information ©2014 [employer].  ° °Tissue Adhesive Wound Care  ° ° °Some cuts and wounds can be closed with tissue adhesive. Adhesive is like glue. It holds the skin together and helps a wound heal faster.  This adhesive goes away on its own as the wound heals.  °HOME CARE  °Showers are allowed. Do not soak the wound in water. Do not take baths, swim, or use hot tubs. Do not use soaps or creams on your wound.  °If a bandage (dressing) was put on, change it as often as told by your doctor.  °Keep the bandage dry.  °Do not scratch, pick, or rub the adhesive.  °Do not put tape over the adhesive. The adhesive could come off.  °Protect the wound from another injury.  °Protect the wound from sun and tanning beds.  °Only take medicine as told by your doctor.  °Keep all doctor visits as told. °GET HELP RIGHT AWAY IF:  °Your wound is red, puffy (swollen), hot, or tender.  °You get a rash after the glue is put on.  °You have more pain in the wound.  °You have a red streak going away from the wound.  °You have yellowish-white fluid (pus) coming from the wound.  °You have more bleeding.  °You have a fever.  °You have chills and start to shake.  °You notice a bad smell coming from the wound.  °Your wound or adhesive breaks open. °MAKE SURE YOU:  °Understand these instructions.  °Will watch your condition.  °Will get help right away if you are not doing well or get worse. °Document Released: 03/08/2008 Document Revised: 03/20/2013 Document Reviewed: 12/19/2012  °ExitCare® Patient Information ©2015 [employer]. This information is not intended to replace advice given to you by your health care provider.   Make sure you discuss any questions you have with your health care provider.  ° ° °

## 2014-02-27 ENCOUNTER — Encounter (HOSPITAL_COMMUNITY): Payer: Self-pay | Admitting: Oral Surgery

## 2015-01-02 ENCOUNTER — Encounter: Payer: Self-pay | Admitting: Neurology

## 2015-01-02 ENCOUNTER — Ambulatory Visit (INDEPENDENT_AMBULATORY_CARE_PROVIDER_SITE_OTHER): Payer: Medicaid Other | Admitting: Neurology

## 2015-01-02 VITALS — BP 120/80 | HR 76 | Ht 64.0 in | Wt 188.4 lb

## 2015-01-02 DIAGNOSIS — R269 Unspecified abnormalities of gait and mobility: Secondary | ICD-10-CM

## 2015-01-02 DIAGNOSIS — F17200 Nicotine dependence, unspecified, uncomplicated: Secondary | ICD-10-CM

## 2015-01-02 DIAGNOSIS — Z720 Tobacco use: Secondary | ICD-10-CM | POA: Diagnosis not present

## 2015-01-02 DIAGNOSIS — I69398 Other sequelae of cerebral infarction: Secondary | ICD-10-CM | POA: Diagnosis not present

## 2015-01-02 DIAGNOSIS — R4189 Other symptoms and signs involving cognitive functions and awareness: Secondary | ICD-10-CM

## 2015-01-02 DIAGNOSIS — G890 Central pain syndrome: Secondary | ICD-10-CM

## 2015-01-02 MED ORDER — GABAPENTIN 300 MG PO CAPS: 600.0000 mg | ORAL_CAPSULE | Freq: Three times a day (TID) | ORAL | Status: DC

## 2015-01-02 NOTE — Progress Notes (Signed)
Note sent

## 2015-01-02 NOTE — Patient Instructions (Addendum)
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  1.  Take gabapentin 300mg  as follows:    AM  Afternoon PM  Week 1 300mg   300mg   300mg   Week 2 300mg   300mg   600mg   Week 3 600mg   300mg   600mg   Week 4 600mg   600mg   600mg   If you develop side effects, stay at the lower dose  2.  As we optimize gabapentin, goal will be to reduce hydrocodone, if able.  OK to continue this medication for now, as central pain syndrome are difficult to manage.  3.  Encouraged to stop smoking  4.  Return to clinic in 3 months

## 2015-01-02 NOTE — Progress Notes (Signed)
[facility] Clinic Note - Initial Visit   Date: 01/02/2015  [name] MRN: [id number] DOB: 13-Aug-1980   Dear Dr. Manuella Ghazi:  Thank you for your kind referral of Taylor [name] for consultation of left sided body following stroke. Although her history is well known to you, please allow [place] to reiterate it for the purpose of our medical record. The patient was accompanied to the clinic by mother who also provides collateral information.     History of Present Illness: Taylor [name] is a 34 y.o. left-handed Caucasian female with hypertension, diabetes mellitus, hypertension, bipolar disorder, depression, anxiety, bithalamic and right PCA territory stroke (2013), substance abuse, referred for evaluation of left arm and leg pain following her stroke.  Patient was previously seen by Dr. Leta Baptist in 2014 for these same complaints who recommended that she establish care with pain management and psychiatry for ongoing issues with chronic pain and mood disorder.    She reports having left arm and leg pain which started following a prolonged hospital course in 2013.  Patient was found unresponsive by family next to a bottle of Xanax on 05/07/2012.  She was taken to [facility] she developed seizure activity, hypothermia, coffee-ground emesis, melanotic stools, and blood glucose of 1706. UDS positive for benzodiazepines. Patient was transferred to [facility] medical ICU and had a prolonged hospital course complicated by a right posterior cerebral artery ischemic infarction in the setting of right P2 stenosis and sepsis.  MRI of the brain confirmed right PCA infarction along with bilateral thalamic strokes. Etiology thought to be embolic from sepsis causing hypercoagulable state.  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  Patient was discharged from the hospital 07/05/2012 to skilled nursing facility.  Patient continues to have issues with memory that include short term memory loss and lives at a skilled nursing facility.  She has persistent left arm and leg pain, sparing the face.  Pain is sharp  with intermittent tingling sensation.  Pain is constant and worse with pressure.  She takes gabapentin 300mg  BID and hydrocodone 5/325 (2 tablet) twice daily.  The greatest relief is with hydrocodone which reduces her pain from 10 down to 2-3/10.  She has not been on any other pain medications.  Patient is on Cymbalta 30mg  for mood disorder, Klonopin for anxiety, Keppra 500mg  BID for seizure control.   Out-side paper records, electronic medical record, and images have been reviewed where available and summarized as:  Results of stroke evaluation per neurology note on 05/15/12: CTA head and neck = Narrowing of the P2 the segment of the right PCA. General irregularity of the bilateral posterior cerebral arteries. Paucity of vascularity in the right PCA territory. 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  Cardiolipins - Negative  Factor V Leiden mutation - negative  Factor II 20210 Mutation - negative  Hemoglobin A1c = 14.4  LDL = 37  TSH = 0.646  TTE - EF 55-60%  TEE - no vegetations    MRI brain 09/18/2012: Abnormal MRI brain (without) demonstrating: 1. 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 2. Mild chronic microvascular ischemic changes in the pons and subcortical white matter. 3. No acute findings.  Past Medical History  Diagnosis Date  . Depression   . Hypertension   . Respiratory failure  11/13    with stroke - in hosp [facility] 12 weeks  . Sepsis   . Bipolar disorder   . Anxiety   . Cerebral artery occlusion with cerebral infarction 11/13    due to elevated blood sugar 1700-off insulin 6  months  . Diabetes mellitus     off insulin 6 months  . Kidney dialysis status     on dialysis(stroke) off after 4-6 weeks  . Seizures     during elvated blood sugsr episode 11/13  . Headache(784.0)   . Vocal cord dysfunction     Past Surgical History  Procedure Laterality Date  . Appendectomy    . Transrectal drainage of pelvic abscess  12/11  . Tracheostomy  11/13    closed 1/14  . Insertion of dialysis catheter  11/13    removed in 4-6 weeks  . Multiple extractions with alveoloplasty N/A 02/24/2014    Procedure: MULTIPLE EXTRACTION WITH ALVEOLOPLASTY AND BIOPSY;  Surgeon: Gae Bon, DDS;  Location: [facility];  Service: Oral Surgery;  Laterality: N/A;     Medications:  Outpatient Encounter Prescriptions as of 01/02/2015  Medication Sig Note  . acetaminophen (TYLENOL) 325 MG tablet Take 650 mg by mouth every 6 (six) hours as needed. pain   . aspirin EC 81 MG tablet Take 81 mg by mouth daily.   . diazepam (VALIUM) 10 MG tablet Take 10 mg by mouth every 8 (eight) hours as needed.    . diclofenac sodium (VOLTAREN) 1 % GEL Apply 1 application topically 4 (four) times daily.   . DULoxetine (CYMBALTA) 30 MG capsule Take 30 mg by mouth daily.   . Fiber TABS Take 2 tablets by mouth daily. 625 mg   . gabapentin (NEURONTIN) 300 MG capsule Take 2 capsules (600 mg total) by mouth 3 (three) times daily.   Marland Kitchen HYDROcodone-acetaminophen (NORCO) 10-325 MG per tablet Take 1 tablet by mouth every 6 (six) hours as needed.   . labetalol (NORMODYNE) 300 MG tablet Take 300 mg by mouth 2 (two) times daily.   Marland Kitchen levETIRAcetam (KEPPRA XR) 500 MG 24 hr tablet Take 1,000 mg by mouth daily.   Marland Kitchen lisinopril (PRINIVIL,ZESTRIL) 20 MG tablet Take 40 mg by mouth daily.    Marland Kitchen omeprazole (PRILOSEC) 20 MG capsule Take 20 mg by mouth daily.   . risperiDONE (RISPERDAL) 1 MG tablet Take 1 mg by mouth at bedtime.   . traZODone (DESYREL) 100 MG tablet Take 100 mg by mouth at bedtime. 2 tablets po qhs   . [DISCONTINUED]  fluticasone (FLONASE) 50 MCG/ACT nasal spray Place 1 spray into both nostrils 2 (two) times daily.   . [DISCONTINUED] gabapentin (NEURONTIN) 300 MG capsule Take 300 mg by mouth 2 (two) times daily.    . [DISCONTINUED] ketorolac (TORADOL) 10 MG tablet Take 10 mg by mouth 2 (two) times daily.   . [DISCONTINUED] Lactobacillus (ACIDOPHILUS PO) Take 1 tablet by mouth daily.   . [DISCONTINUED] loratadine (CLARITIN) 10 MG tablet Take 10 mg by mouth daily.   . [DISCONTINUED] naproxen (NAPROSYN) 500 MG tablet Take 500 mg by mouth 2 (two) times daily with a meal.   . [DISCONTINUED] senna (SENOKOT) 8.6 MG tablet Take 8.6 mg by mouth. 01/02/2015: Received from: [facility]  . insulin NPH (HUMULIN N,NOVOLIN N) 100 UNIT/ML injection Inject 22 Units into the skin daily with dinner.   . [DISCONTINUED] amoxicillin (AMOXIL) 500 MG capsule Take 1 capsule (500 mg total) by mouth 3 (three) times daily.   . [  DISCONTINUED] butalbital-acetaminophen-caffeine (FIORICET, ESGIC) 50-325-40 MG per tablet Take 2 tablets by mouth every 4 (four) hours as needed for headache.   . [DISCONTINUED] DULoxetine (CYMBALTA) 60 MG capsule Take 60 mg by mouth. 01/02/2015: Received from: [facility]  . [DISCONTINUED] gabapentin (NEURONTIN) 100 MG capsule Take 100 mg by mouth 3 (three) times daily.    No facility-administered encounter medications on file as of 01/02/2015.     Allergies:  Allergies  Allergen Reactions  . Compazine Other (See Comments)    Unknown  . Oxycodone Nausea And Vomiting    Severe patient will not take oxycodone or percocet.  . Prochlorperazine Other (See Comments)    Alert but unable to move  . Promethazine Swelling    Lips swell  . Sumatriptan Rash    Family History: Family History  Problem Relation Age of Onset  . Lung cancer Maternal Grandmother   . Heart attack Maternal Grandfather   . Diabetes Paternal Grandfather     Social History: History    Substance Use Topics  . Smoking status: Current Every Day Smoker -- 0.60 packs/day for 18 years    Types: Cigarettes  . Smokeless tobacco: Never Used  . Alcohol Use: No   History   Social History Narrative   Pt lives in nursing home currently. She is single and does not have any children. She has 2 yrs of college level education. She has 5-6 caffeine drinks daily.     Review of Systems:  CONSTITUTIONAL: No fevers, chills, night sweats, or weight loss.   EYES: No visual changes or eye pain ENT: No hearing changes.  No history of nose bleeds.   RESPIRATORY: No cough, wheezing and shortness of breath.   CARDIOVASCULAR: Negative for chest pain, and palpitations.   GI: Negative for abdominal discomfort, blood in stools or black stools.  No recent change in bowel habits.   GU:  No history of incontinence.   MUSCLOSKELETAL: +history of joint pain or swelling.  No myalgias.   SKIN: Negative for lesions, rash, and itching.   HEMATOLOGY/ONCOLOGY: Negative for prolonged bleeding, bruising easily, and swollen nodes.  No history of cancer.   ENDOCRINE: Negative for cold or heat intolerance, polydipsia or goiter.   PSYCH:  +depression or anxiety symptoms.   NEURO: As Above.   Vital Signs:  BP 120/80 mmHg  Pulse 76  Ht 5\' 4"  (1.626 m)  Wt 188 lb 6 oz (85.446 kg)  BMI 32.32 kg/m2  SpO2 97%   General Medical Exam:   General:  Well appearing, comfortable.   Eyes/ENT: see cranial nerve examination.   Neck: No masses appreciated.  Full range of motion without tenderness.  No carotid bruits. Respiratory:  Clear to auscultation, good air entry bilaterally.   Cardiac:  Regular rate and rhythm, no murmur.   Extremities:  No deformities, edema, or skin discoloration.  Skin:  No rashes or lesions.  Neurological Exam: MENTAL STATUS including orientation to time, place, person, recent and remote memory, attention span and concentration, language, and fund of knowledge is fair.  Speech is not  dysarthric.  CRANIAL NERVES: II:  No visual field defects.  Unremarkable fundi.   III-IV-VI: Pupils equal round and reactive to light.  Normal conjugate, extra-ocular eye movements in all directions of gaze.  No nystagmus.  Right ptosis (old).   V:  Normal facial sensation.  VII:  Normal facial symmetry and movements.  Bilateral palmomental reflexes present.  VIII:  Normal hearing and vestibular function.  IX-X:  Normal palatal movement.   XI:  Normal shoulder shrug and head rotation.   XII:  Normal tongue strength and range of motion, no deviation or fasciculation.  MOTOR:  No atrophy, fasciculations or abnormal movements.  No pronator drift.  Tone is normal.    Right Upper Extremity:    Left Upper Extremity:    Deltoid  5/5   Deltoid  5/5   Biceps  5/5   Biceps  5/5   Triceps  5/5   Triceps  5/5   Wrist extensors  5/5   Wrist extensors  5/5   Wrist flexors  5/5   Wrist flexors  5/5   Finger extensors  5/5   Finger extensors  5/5   Finger flexors  5/5   Finger flexors  5/5   Dorsal interossei  5/5   Dorsal interossei  5/5   Abductor pollicis  5/5   Abductor pollicis  5/5   Tone (Ashworth scale)  0  Tone (Ashworth scale)  0   Right Lower Extremity:    Left Lower Extremity:    Hip flexors  5/5   Hip flexors  4/5   Hip extensors  5/5   Hip extensors  4/5   Knee flexors  5/5   Knee flexors  4/5   Knee extensors  5/5   Knee extensors  4/5   Dorsiflexors  5/5   Dorsiflexors  4/5   Plantarflexors  5/5   Plantarflexors  4/5   Toe extensors  5/5   Toe extensors  4/5   Toe flexors  5/5   Toe flexors  4/5   Tone (Ashworth scale)  0  Tone (Ashworth scale)  0+   MSRs:  Right                                                                 Left brachioradialis 2+  brachioradialis 2+  biceps 2+  biceps 2+  triceps 2+  triceps 2+  patellar 2+  patellar 2+  ankle jerk 2+  ankle jerk 2+  Hoffman no  Hoffman no  plantar response down  plantar response down   SENSORY: Sensation reduced to  all modalities over the left arm and leg and intact on the right side.  Pin prick is preserved in the back.  COORDINATION/GAIT: Mild imprecision with finger to nose testing on the left.  Reduced rate and amplitude of finger tapping on the left.  Movements intact on the left. She has high steppage gait on the left, which is greater than would be expected for the degree of her weakness and spasticity.  She is able to stand on heels and toes. Tandem gait intact.    IMPRESSION: 1.  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  Diagnosis was explained at length and discussed that central pain disorders can be difficult to treat but it is ressuring that she is getting benefit from both gabapentin and hydrocodone; however, goal is to optimize her gabapentin and try to reduce hydrocodone.  We have alternative medications also including amitriptyline, lyrica, venlafaxine, lamictal, as well as increasing cymbalta.  2. 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 Tobacco use disorder  PLAN/RECOMMENDATIONS:  1.  Take gabapentin 300mg  as follows:    AM  Afternoon PM  Week 1  300mg   300mg   300mg   Week 2  300mg   300mg   600mg   Week 3  600mg   300mg   600mg   Week 4  600mg   600mg   600mg   If she develops side effects, instructed to stay at the lower dose  2.  As we optimize gabapentin, goal will be to reduce hydrocodone, if able.  OK to continue this medication for now, as central pain syndrome are difficult to manage.  She is aware that this a non-narcotic prescribing practice and will have this written by her PCP.  3.  Encouraged to stop smoking  4.  Return to clinic in 3 months   The duration of this appointment visit was 60 minutes of face-to-face time with the patient.  Greater than 50% of this time was spent in counseling, explanation of diagnosis, planning of further  management, and coordination of care.   Thank you for allowing me to participate in patient's care.  If I can answer any additional questions, I would be pleased to do so.    Sincerely,    Clydean Posas K. Posey Pronto, DO

## 2015-04-13 ENCOUNTER — Ambulatory Visit: Payer: Medicaid Other | Admitting: Neurology

## 2015-04-17 ENCOUNTER — Other Ambulatory Visit: Payer: Self-pay | Admitting: *Deleted

## 2015-04-17 ENCOUNTER — Telehealth: Payer: Self-pay | Admitting: Neurology

## 2015-04-17 DIAGNOSIS — G609 Hereditary and idiopathic neuropathy, unspecified: Secondary | ICD-10-CM

## 2015-04-17 MED ORDER — GABAPENTIN 300 MG PO CAPS: 600.0000 mg | ORAL_CAPSULE | Freq: Three times a day (TID) | ORAL | Status: DC

## 2015-04-17 NOTE — Telephone Encounter (Signed)
PT's mother Lattie Haw called in regards to her medication gabapentin/Dawn CB# [phone]

## 2015-04-17 NOTE — Telephone Encounter (Signed)
Patient's mom said that patient missed her last appointment because she was out of town and unable to bring her.  She said that she does have an appointment in Feb that patient will not miss.  Needs refill on gabapentin.  Rx sent.

## 2015-06-11 ENCOUNTER — Other Ambulatory Visit: Payer: Self-pay | Admitting: *Deleted

## 2015-06-11 MED ORDER — GABAPENTIN 300 MG PO CAPS: 600.0000 mg | ORAL_CAPSULE | Freq: Three times a day (TID) | ORAL | Status: DC

## 2015-06-12 ENCOUNTER — Telehealth: Payer: Self-pay | Admitting: Neurology

## 2015-06-12 NOTE — Telephone Encounter (Signed)
Caryl Pina from [place] called in regards to a prescription for PT/Dawn CB# [phone]

## 2015-06-12 NOTE — Telephone Encounter (Signed)
Pharmacy called to verify patient's one month supply.

## 2015-08-03 ENCOUNTER — Ambulatory Visit (INDEPENDENT_AMBULATORY_CARE_PROVIDER_SITE_OTHER): Payer: Medicaid Other | Admitting: Neurology

## 2015-08-03 ENCOUNTER — Encounter: Payer: Self-pay | Admitting: Neurology

## 2015-08-03 VITALS — BP 138/80 | HR 88 | Ht 64.0 in | Wt 210.5 lb

## 2015-08-03 DIAGNOSIS — G890 Central pain syndrome: Secondary | ICD-10-CM | POA: Diagnosis not present

## 2015-08-03 MED ORDER — GABAPENTIN 300 MG PO CAPS: 600.0000 mg | ORAL_CAPSULE | Freq: Three times a day (TID) | ORAL | Status: DC

## 2015-08-03 NOTE — Patient Instructions (Addendum)
1.  Start home PT for gait training 2.  Continue gabapentin 600mg  three times daily 3.  Start walking 30-min daily 4.  Monitor weight weekly  Return to clinic 1 year

## 2015-08-03 NOTE — Progress Notes (Signed)
Follow-up Visit   Date: 08/03/2015    Taylor [name] MRN: [id number] DOB: 03/01/1981   Interim History: Taylor [name] is a 35 y.o. left-handed Caucasian female with hypertension, diabetes mellitus, hypertension, bipolar disorder, depression, anxiety, bithalamic and right PCA territory stroke (2013), substance abuse returning to the clinic for follow-up of thalamic pain syndrome.  The patient was accompanied to the clinic by caregiver who also provides collateral information.    History of present illness: Patient was previously seen by Dr. Leta Baptist in 2014 for these same complaints who recommended that she establish care with pain management and psychiatry for ongoing issues with chronic pain and mood disorder.   She reports having left arm and leg pain which started following a prolonged hospital course in 2013. Patient was found unresponsive by family next to a bottle of Xanax on 05/07/2012. She was taken to [facility] she developed seizure activity, hypothermia, coffee-ground emesis, melanotic stools, and blood glucose of 1706. UDS positive for benzodiazepines. Patient was transferred to [facility] medical ICU and had a prolonged hospital course complicated by a right posterior cerebral artery ischemic infarction in the setting of right P2 stenosis and sepsis. MRI of the brain confirmed right PCA infarction along with bilateral thalamic strokes. Etiology thought to be embolic from sepsis causing hypercoagulable state. 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 Patient was discharged from the hospital 07/05/2012 to skilled nursing facility.  Patient continues to have issues with memory that include short term memory loss and lives at a skilled nursing facility. She has persistent  left arm and leg pain, sparing the face. Pain is sharp with intermittent tingling sensation. Pain is constant and worse with pressure. She takes gabapentin 300mg  BID and hydrocodone 5/325 (2 tablet) twice daily. The greatest relief is with hydrocodone which reduces her pain from 10 down to 2-3/10. She has not been on any other pain medications.  Patient is on Cymbalta 30mg  for mood disorder, Klonopin for anxiety, Keppra 500mg  BID for seizure control.   UPDATE 08/03/2015:  She has been off hydrocodone since December and remains on gabapentin 600mg  TID.  She has gained 30lb since July 2016 and attributes this to moving to an assisted facility where she does not walk as much.  Her caregiver also states often goes for second portions during meal times. No new complaints.    Medications:  Current Outpatient Prescriptions on File Prior to Visit  Medication Sig Dispense Refill  . acetaminophen (TYLENOL) 325 MG tablet Take 650 mg by mouth every 6 (six) hours as needed. pain    . aspirin EC 81 MG tablet Take 81 mg by mouth daily.    . diazepam (VALIUM) 10 MG tablet Take 10 mg by mouth every 8 (eight) hours as needed.     . diclofenac sodium (VOLTAREN) 1 % GEL Apply 1 application topically 4 (four) times daily.    . DULoxetine (CYMBALTA) 30 MG capsule Take 30 mg by mouth daily.    . Fiber TABS Take 2 tablets by mouth daily. 625 mg    . HYDROcodone-acetaminophen (NORCO) 10-325 MG per tablet Take 1 tablet by mouth every 6 (six) hours as needed. 30 tablet 0  . labetalol (NORMODYNE) 300 MG tablet Take 300 mg by mouth 2 (two) times daily.    Marland Kitchen levETIRAcetam (KEPPRA XR) 500 MG 24 hr  tablet Take 1,000 mg by mouth daily.    Marland Kitchen lisinopril (PRINIVIL,ZESTRIL) 20 MG tablet Take 40 mg by mouth daily.     Marland Kitchen omeprazole (PRILOSEC) 20 MG capsule Take 20 mg by mouth daily.    . risperiDONE (RISPERDAL) 1 MG tablet Take 1 mg by mouth at bedtime.    . traZODone (DESYREL) 100 MG tablet Take 100 mg by mouth at bedtime. 2  tablets po qhs    . insulin NPH (HUMULIN N,NOVOLIN N) 100 UNIT/ML injection Inject 22 Units into the skin daily with dinner.     No current facility-administered medications on file prior to visit.    Allergies:  Allergies  Allergen Reactions  . Compazine Other (See Comments)    Unknown  . Oxycodone Nausea And Vomiting    Severe patient will not take oxycodone or percocet.  . Prochlorperazine Other (See Comments)    Alert but unable to move  . Promethazine Swelling    Lips swell  . Sumatriptan Rash    Review of Systems:  CONSTITUTIONAL: No fevers, chills, night sweats, or weight loss.  EYES: No visual changes or eye pain ENT: No hearing changes.  No history of nose bleeds.   RESPIRATORY: No cough, wheezing and shortness of breath.   CARDIOVASCULAR: Negative for chest pain, and palpitations.   GI: Negative for abdominal discomfort, blood in stools or black stools.  No recent change in bowel habits.   GU:  No history of incontinence.   MUSCLOSKELETAL: No history of joint pain or swelling.  No myalgias.   SKIN: Negative for lesions, rash, and itching.   ENDOCRINE: Negative for cold or heat intolerance, polydipsia or goiter.   PSYCH:  + depression or anxiety symptoms.   NEURO: As Above.   Vital Signs:  BP 138/80 mmHg  Pulse 88  Ht 5\' 4"  (1.626 m)  Wt 210 lb 8 oz (95.482 kg)  BMI 36.11 kg/m2  SpO2 95%  Neurological Exam: MENTAL STATUS including orientation to time, place, person, recent and remote memory, attention span and concentration, language, and fund of knowledge is normal.  Speech is not dysarthric.  CRANIAL NERVES: No visual field defects.  Pupils equal round and reactive to light.  Normal conjugate, extra-ocular eye movements in all directions of gaze.  Right ptosis (old). Normal facial sensation.  Face is symmetric. Palate elevates symmetrically.  Tongue is midline.  MOTOR:  Motor strength is 5/5 in all extremities, including left lower leg is 5-/5.         MSRs:  Reflexes are 2+/4 throughout.  SENSORY: Sensation reduced to all modalities over the left arm and leg and intact on the right side.  COORDINATION/GAIT:  Gait narrow based and stable, but she lifts the left leg up in a nonphysiological pattern, which is not present when toe and heel walking.   Data: Results of stroke evaluation per neurology note on 05/15/12: CTA head and neck = Narrowing of the P2 the segment of the right PCA. General irregularity of the bilateral posterior cerebral arteries. Paucity of vascularity in the right PCA territory. 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  Cardiolipins - Negative  Factor V Leiden mutation - negative  Factor II 20210 Mutation - negative  Hemoglobin A1c = 14.4  LDL = 37  TSH = 0.646  TTE - EF 55-60%  TEE - no vegetations   MRI brain 09/18/2012: Abnormal MRI brain (without) demonstrating: 1. Subacute-chronic right occipital and bilateral thalamic  infarctions. 2. Mild chronic microvascular ischemic changes in the pons and subcortical white matter. 3. No acute findings.  IMPRESSION/PLAN: 1. 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  Pain is well controlled on gabapentin 600mg  TID and she has been able to taper off hydrocodone, which is great She has, however, gain > 30lb since her last visit and I discussed that weight gain can be a side effect of the medication, but her caregiver feels that it is moreso due to being more sedentary and going for second meals at her assisted living facility.   Continue gabapentin 600mg  TID We have alternative medications also including amitriptyline, lyrica, venlafaxine, lamictal, as well as increasing cymbalta.  2. Gait abnormality which appears nonphysiologic Start home PT for gait training  3.  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 Weight gain of 30lb Monitor weight weekly Encouraged to start an exercise program and at least walk 30-min daily Healthy diet choices recommended Consider tapering gabapentin if weight gain continues  5.  Tobacco use disorder, encouraged to quit smoking  Return to clinic in 1 year    The duration of this appointment visit was 30 minutes of face-to-face time with the patient.  Greater than 50% of this time was spent in counseling, explanation of diagnosis, planning of further management, and coordination of care.   Thank you for allowing me to participate in patient's care.  If I can answer any additional questions, I would be pleased to do so.    Sincerely,    Donika K. Posey Pronto, DO

## 2015-08-04 ENCOUNTER — Telehealth: Payer: Self-pay | Admitting: Neurology

## 2015-08-04 NOTE — Telephone Encounter (Signed)
Melissa at [facility] states that we sent a referral to them but patient has medicaid and they will not cover please call [phone]

## 2015-08-04 NOTE — Progress Notes (Signed)
Note routed

## 2015-08-05 NOTE — Telephone Encounter (Signed)
Left message for [name] that I got her message.

## 2015-08-07 ENCOUNTER — Telehealth: Payer: Self-pay | Admitting: *Deleted

## 2015-08-07 ENCOUNTER — Encounter: Payer: Self-pay | Admitting: *Deleted

## 2015-08-07 NOTE — Telephone Encounter (Signed)
Noted.  Please inform patient and encourage her to do her own exercises as I discussed in the office.

## 2015-08-07 NOTE — Telephone Encounter (Signed)
-----   Message from [facility] sent at 08/05/2015  4:01 PM EST ----- Baxter Flattery with [facility] called in regards to PT and would like a call back at [phone]

## 2015-08-07 NOTE — Telephone Encounter (Signed)
I called Baxter Flattery back and she said that Medicaid will not cover PT, only nursing.  Please advise.

## 2015-08-07 NOTE — Telephone Encounter (Signed)
Unable to reach patient by phone. Letter sent.

## 2016-04-13 ENCOUNTER — Ambulatory Visit (INDEPENDENT_AMBULATORY_CARE_PROVIDER_SITE_OTHER): Payer: Medicaid Other | Admitting: Neurology

## 2016-04-13 ENCOUNTER — Encounter: Payer: Self-pay | Admitting: Neurology

## 2016-04-13 VITALS — BP 110/68 | HR 97 | Ht 64.0 in | Wt 193.0 lb

## 2016-04-13 DIAGNOSIS — R269 Unspecified abnormalities of gait and mobility: Secondary | ICD-10-CM

## 2016-04-13 DIAGNOSIS — R4189 Other symptoms and signs involving cognitive functions and awareness: Secondary | ICD-10-CM | POA: Diagnosis not present

## 2016-04-13 DIAGNOSIS — F17200 Nicotine dependence, unspecified, uncomplicated: Secondary | ICD-10-CM

## 2016-04-13 DIAGNOSIS — G890 Central pain syndrome: Secondary | ICD-10-CM

## 2016-04-13 DIAGNOSIS — I69398 Other sequelae of cerebral infarction: Secondary | ICD-10-CM | POA: Diagnosis not present

## 2016-04-13 MED ORDER — GABAPENTIN 300 MG PO CAPS: 900.0000 mg | ORAL_CAPSULE | Freq: Three times a day (TID) | ORAL | 11 refills | Status: DC

## 2016-04-13 NOTE — Patient Instructions (Addendum)
1.  Increase gabapentin 900mg  thee times daily 2.  Keep up the excellent work with your weight loss 3.  Please stop smoking  Return to clinic 6 months

## 2016-04-13 NOTE — Progress Notes (Signed)
Follow-up Visit   Date: 04/13/16    Taylor [name] MRN: [id number] DOB: [birth year]   Interim History: Taylor [name] is a 35 y.o. left-handed Caucasian female with hypertension, diabetes mellitus, hypertension, bipolar disorder, depression, anxiety, bithalamic and right PCA territory stroke (2013), substance abuse returning to the clinic for follow-up of thalamic pain syndrome.  The patient was accompanied to the clinic by caregiver who also provides collateral information.    History of present illness: Patient was previously seen by Dr. Leta [name] in 2014 for these same complaints who recommended that she establish care with pain management and psychiatry for ongoing issues with chronic pain and mood disorder.   She reports having left arm and leg pain which started following a prolonged hospital course in 2013. Patient was found unresponsive by family next to a bottle of Xanax on 05/07/2012. She was taken to [facility] she developed seizure activity, hypothermia, coffee-ground emesis, melanotic stools, and blood glucose of 1706. UDS positive for benzodiazepines. Patient was transferred to [facility] medical ICU and had a prolonged hospital course complicated by a right posterior cerebral artery ischemic infarction in the setting of right P2 stenosis and sepsis. MRI of the brain confirmed right PCA infarction along with bilateral thalamic strokes. Etiology thought to be embolic from sepsis causing hypercoagulable state. 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 Patient was discharged from the hospital 07/05/2012 to skilled nursing facility.  Patient continues to have issues with memory that include short term memory loss and lives at a skilled nursing facility. She has persistent left  arm and leg pain, sparing the face. Pain is sharp with intermittent tingling sensation. Pain is constant and worse with pressure. She takes gabapentin 300mg  BID and hydrocodone 5/325 (2 tablet) twice daily. The greatest relief is with hydrocodone which reduces her pain from 10 down to 2-3/10. She has not been on any other pain medications.  Patient is on Cymbalta 30mg  for mood disorder, Klonopin for anxiety, Keppra 500mg  BID for seizure control.   UPDATE 08/03/2015:  She has been off hydrocodone since December and remains on gabapentin 600mg  TID.  She has gained 30lb since July 2016 and attributes this to moving to an assisted facility where she does not walk as much.  Her caregiver also states often goes for second portions during meal times.   UPDATE 04/13/2016:  She presents for follow-up and reports slight worsening of tingling of the left hand tingling which is worse at night and wakes her up from sleeping.  She also complains of left leg prickly sensation over the left thigh, lower leg, and foot. Pain was really well controlled on gabapentin 600mg  TID, but recently has become more bothersome.  She remains off hydrocodone.  She hast lost 17lb since February 2017!    Medications:  Current Outpatient Prescriptions on File Prior to Visit  Medication Sig Dispense Refill  . acetaminophen (TYLENOL) 325 MG tablet Take 650 mg by mouth every 6 (six) hours as needed. pain    . aspirin EC 81 MG tablet Take 81 mg by mouth daily.    . diazepam (VALIUM) 10 MG tablet Take 10 mg by mouth every 8 (eight) hours as needed.     . diclofenac sodium (VOLTAREN) 1 % GEL Apply 1 application topically 4 (four) times daily.    . DULoxetine (CYMBALTA) 30  MG capsule Take 30 mg by mouth daily.    Marland Kitchen HYDROcodone-acetaminophen (NORCO) 10-325 MG per tablet Take 1 tablet by mouth every 6 (six) hours as needed. 30 tablet 0  . labetalol (NORMODYNE) 300 MG tablet Take 300 mg by mouth 2 (two) times daily.    Marland Kitchen levETIRAcetam  (KEPPRA XR) 500 MG 24 hr tablet Take 1,000 mg by mouth daily.    Marland Kitchen lisinopril (PRINIVIL,ZESTRIL) 20 MG tablet Take 40 mg by mouth daily.     Marland Kitchen omeprazole (PRILOSEC) 20 MG capsule Take 20 mg by mouth daily.    . risperiDONE (RISPERDAL) 1 MG tablet Take 1 mg by mouth at bedtime.    . traZODone (DESYREL) 100 MG tablet Take 100 mg by mouth at bedtime. 2 tablets po qhs    . insulin NPH (HUMULIN N,NOVOLIN N) 100 UNIT/ML injection Inject 22 Units into the skin daily with dinner.     No current facility-administered medications on file prior to visit.     Allergies:  Allergies  Allergen Reactions  . Compazine Other (See Comments)    Unknown  . Oxycodone Nausea And Vomiting    Severe patient will not take oxycodone or percocet.  . Prochlorperazine Other (See Comments)    Alert but unable to move  . Promethazine Swelling    Lips swell  . Sumatriptan Rash    Review of Systems:  CONSTITUTIONAL: No fevers, chills, night sweats, or weight loss.  EYES: No visual changes or eye pain ENT: No hearing changes.  No history of nose bleeds.   RESPIRATORY: No cough, wheezing and shortness of breath.   CARDIOVASCULAR: Negative for chest pain, and palpitations.   GI: Negative for abdominal discomfort, blood in stools or black stools.  No recent change in bowel habits.   GU:  No history of incontinence.   MUSCLOSKELETAL: No history of joint pain or swelling.  No myalgias.   SKIN: Negative for lesions, rash, and itching.   ENDOCRINE: Negative for cold or heat intolerance, polydipsia or goiter.   PSYCH:  + depression or anxiety symptoms.   NEURO: As Above.   Vital Signs:  BP 110/68   Pulse 97   Ht 5\' 4"  (1.626 m)   Wt 193 lb (87.5 kg)   SpO2 96%   BMI 33.13 kg/m   Neurological Exam: MENTAL STATUS including orientation to time, place, person, recent and remote memory, attention span and concentration, language, and fund of knowledge is normal.  Speech is not dysarthric.  CRANIAL NERVES:  Pupils  equal round and reactive to light.  Normal conjugate, extra-ocular eye movements in all directions of gaze.  Right ptosis (old).  Face is symmetric. Palate elevates symmetrically.  Tongue is midline.  MOTOR:  Motor strength is 5/5 in all extremities, except left lower leg is 5-/5.        MSRs:  Reflexes are 2+/4 throughout.  SENSORY: Sensation reduced to all modalities over the left arm and leg and intact on the right side.  COORDINATION/GAIT:  Gait narrow based and stable, but she lifts the left leg up in a nonphysiological pattern  Data: Results of stroke evaluation per neurology note on 05/15/12: CTA head and neck = Narrowing of the P2 the segment of the right PCA. General irregularity of the bilateral posterior cerebral arteries. Paucity of vascularity in the right PCA territory. 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  Cardiolipins - Negative  Factor V Leiden mutation - negative  Factor II 20210 Mutation - negative  Hemoglobin A1c = 14.4  LDL = 37  TSH = 0.646  TTE - EF 55-60%  TEE - no vegetations   MRI brain 09/18/2012: Abnormal MRI brain (without) demonstrating: 1. 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 2. Mild chronic microvascular ischemic changes in the pons and subcortical white matter. 3. No acute findings.  IMPRESSION/PLAN: 1. 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  Pain is has increased since her last visit, so will increase gabapentin 900mg  TID.  She remains off hydrocodone We have alternative medications also including amitriptyline, lyrica, venlafaxine, lamictal, as well as increasing cymbalta.  2. Gait abnormality is nonphysiologic  3.  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 Obesity, praised her for her 17lb weight loss and  encouraged her to continue exercise regimen with walking  5.  Tobacco use disorder.  Smoking cessation instruction/counseling given:  counseled patient on the dangers of tobacco use, advised patient to stop smoking, and reviewed strategies to maximize success  Return to clinic in 6 months   The duration of this appointment visit was 30 minutes of face-to-face time with the patient.  Greater than 50% of this time was spent in counseling, explanation of diagnosis, planning of further management, and coordination of care.   Thank you for allowing me to participate in patient's care.  If I can answer any additional questions, I would be pleased to do so.    Sincerely,    Donika K. Posey Pronto, DO

## 2016-10-12 ENCOUNTER — Ambulatory Visit: Payer: Medicaid Other | Admitting: Neurology

## 2016-10-12 NOTE — Progress Notes (Deleted)
Follow-up Visit   Date: 10/12/16    Taylor [name] MRN: [id number] DOB: 05-28-1981   Interim History: Taylor [name] is a 36 y.o. left-handed Caucasian female with hypertension, diabetes mellitus, hypertension, bipolar disorder, depression, anxiety, bithalamic and right PCA territory stroke (2013), substance abuse returning to the clinic for follow-up of thalamic pain syndrome.  The patient was accompanied to the clinic by caregiver who also provides collateral information.    History of present illness: Patient was previously seen by Dr. Leta Baptist in 2014 for these same complaints who recommended that she establish care with pain management and psychiatry for ongoing issues with chronic pain and mood disorder.   She reports having left arm and leg pain which started following a prolonged hospital course in 2013. Patient was found unresponsive by family next to a bottle of Xanax on 05/07/2012. She was taken to [facility] she developed seizure activity, hypothermia, coffee-ground emesis, melanotic stools, and blood glucose of 1706. UDS positive for benzodiazepines. Patient was transferred to [facility] medical ICU and had a prolonged hospital course complicated by a right posterior cerebral artery ischemic infarction in the setting of right P2 stenosis and sepsis. MRI of the brain confirmed right PCA infarction along with bilateral thalamic strokes. Etiology thought to be embolic from sepsis causing hypercoagulable state. 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 Patient was discharged from the hospital 07/05/2012 to skilled nursing facility.  Patient continues to have issues with memory that include short term memory loss and lives at a skilled nursing facility. She has persistent left  arm and leg pain, sparing the face. Pain is sharp with intermittent tingling sensation. Pain is constant and worse with pressure. She takes gabapentin 300mg  BID and hydrocodone 5/325 (2 tablet) twice daily. The greatest relief is with hydrocodone which reduces her pain from 10 down to 2-3/10. She has not been on any other pain medications.  Patient is on Cymbalta 30mg  for mood disorder, Klonopin for anxiety, Keppra 500mg  BID for seizure control.   UPDATE 08/03/2015:  She has been off hydrocodone since December and remains on gabapentin 600mg  TID.  She has gained 30lb since July 2016 and attributes this to moving to an assisted facility where she does not walk as much.  Her caregiver also states often goes for second portions during meal times.   UPDATE 04/13/2016:  She presents for follow-up and reports slight worsening of tingling of the left hand tingling which is worse at night and wakes her up from sleeping.  She also complains of left leg prickly sensation over the left thigh, lower leg, and foot. Pain was really well controlled on gabapentin 600mg  TID, but recently has become more bothersome.  She remains off hydrocodone.  She hast lost 17lb since February 2017!    UPDATE 10/12/2016:  She is here for 6 month follow-up visit.  ***  Medications:  Current Outpatient Prescriptions on File Prior to Visit  Medication Sig Dispense Refill  . acetaminophen (TYLENOL) 325 MG tablet Take 650 mg by mouth every 6 (six) hours as needed. pain    . aspirin EC 81 MG tablet Take 81 mg by mouth daily.    . diazepam (VALIUM) 10 MG tablet Take 10 mg by mouth every 8 (eight) hours as needed.     . diclofenac sodium (VOLTAREN) 1 % GEL Apply  1 application topically 4 (four) times daily.    . DULoxetine (CYMBALTA) 30 MG capsule Take 30 mg by mouth daily.    Marland Kitchen gabapentin (NEURONTIN) 300 MG capsule Take 3 capsules (900 mg total) by mouth 3 (three) times daily. 270 capsule 11  . HYDROcodone-acetaminophen (NORCO)  10-325 MG per tablet Take 1 tablet by mouth every 6 (six) hours as needed. 30 tablet 0  . insulin NPH (HUMULIN N,NOVOLIN N) 100 UNIT/ML injection Inject 22 Units into the skin daily with dinner.    . labetalol (NORMODYNE) 300 MG tablet Take 300 mg by mouth 2 (two) times daily.    Marland Kitchen levETIRAcetam (KEPPRA XR) 500 MG 24 hr tablet Take 1,000 mg by mouth daily.    Marland Kitchen lisinopril (PRINIVIL,ZESTRIL) 20 MG tablet Take 40 mg by mouth daily.     Marland Kitchen omeprazole (PRILOSEC) 20 MG capsule Take 20 mg by mouth daily.    . risperiDONE (RISPERDAL) 1 MG tablet Take 1 mg by mouth at bedtime.    . traZODone (DESYREL) 100 MG tablet Take 100 mg by mouth at bedtime. 2 tablets po qhs     No current facility-administered medications on file prior to visit.     Allergies:  Allergies  Allergen Reactions  . Compazine Other (See Comments)    Unknown  . Oxycodone Nausea And Vomiting    Severe patient will not take oxycodone or percocet.  . Prochlorperazine Other (See Comments)    Alert but unable to move  . Promethazine Swelling    Lips swell  . Sumatriptan Rash    Review of Systems:  CONSTITUTIONAL: No fevers, chills, night sweats, or weight loss.  EYES: No visual changes or eye pain ENT: No hearing changes.  No history of nose bleeds.   RESPIRATORY: No cough, wheezing and shortness of breath.   CARDIOVASCULAR: Negative for chest pain, and palpitations.   GI: Negative for abdominal discomfort, blood in stools or black stools.  No recent change in bowel habits.   GU:  No history of incontinence.   MUSCLOSKELETAL: No history of joint pain or swelling.  No myalgias.   SKIN: Negative for lesions, rash, and itching.   ENDOCRINE: Negative for cold or heat intolerance, polydipsia or goiter.   PSYCH:  + depression or anxiety symptoms.   NEURO: As Above.   Vital Signs:  There were no vitals taken for this visit.  Neurological Exam: MENTAL STATUS including orientation to time, place, person, recent and remote  memory, attention span and concentration, language, and fund of knowledge is normal.  Speech is not dysarthric.  CRANIAL NERVES:  Pupils equal round and reactive to light.  Normal conjugate, extra-ocular eye movements in all directions of gaze.  Right ptosis (old).  Face is symmetric. Palate elevates symmetrically.  Tongue is midline.  MOTOR:  Motor strength is 5/5 in all extremities, except left lower leg is 5-/5.        MSRs:  Reflexes are 2+/4 throughout.  SENSORY: Sensation reduced to all modalities over the left arm and leg and intact on the right side.  COORDINATION/GAIT:  Gait narrow based and stable, but she lifts the left leg up in a nonphysiological pattern  Data: Results of stroke evaluation per neurology note on 05/15/12: CTA head and neck = Narrowing of the P2 the segment of the right PCA. General irregularity of the bilateral posterior cerebral arteries. Paucity of vascularity in the right PCA territory. Moderate to high-grade stenosis in the proximal dural portion of the left  vertebral artery.  Cardiolipins - Negative  Factor V Leiden mutation - negative  Factor II 20210 Mutation - negative  Hemoglobin A1c = 14.4  LDL = 37  TSH = 0.646  TTE - EF 55-60%  TEE - no vegetations   MRI brain 09/18/2012: Abnormal MRI brain (without) demonstrating: 1. 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 2. Mild chronic microvascular ischemic changes in the pons and subcortical white matter. 3. No acute findings.  IMPRESSION/PLAN: 1. 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  Pain is has increased since her last visit, so will increase gabapentin 900mg  TID.  She remains off hydrocodone We have alternative medications also including amitriptyline, lyrica, venlafaxine, lamictal, as well as increasing cymbalta.  2. Gait abnormality is nonphysiologic  3.  Moderate cognitive impairement due to effects of  stroke Encouraged her to stay active, engagement in mentally stimulating activities  4. Obesity, praised her for her 17lb weight loss and encouraged her to continue exercise regimen with walking  5.  Tobacco use disorder.  Smoking cessation instruction/counseling given:  counseled patient on the dangers of tobacco use, advised patient to stop smoking, and reviewed strategies to maximize success  Return to clinic in 6 months   The duration of this appointment visit was 30 minutes of face-to-face time with the patient.  Greater than 50% of this time was spent in counseling, explanation of diagnosis, planning of further management, and coordination of care.   Thank you for allowing me to participate in patient's care.  If I can answer any additional questions, I would be pleased to do so.    Sincerely,    Donika K. Posey Pronto, DO

## 2016-10-18 ENCOUNTER — Encounter: Payer: Self-pay | Admitting: Neurology

## 2016-11-30 ENCOUNTER — Encounter: Payer: Self-pay | Admitting: Neurology

## 2016-11-30 ENCOUNTER — Telehealth: Payer: Self-pay | Admitting: Neurology

## 2016-11-30 ENCOUNTER — Ambulatory Visit (INDEPENDENT_AMBULATORY_CARE_PROVIDER_SITE_OTHER): Payer: Medicaid Other | Admitting: Neurology

## 2016-11-30 VITALS — BP 104/64 | HR 86 | Ht 64.0 in | Wt 184.2 lb

## 2016-11-30 DIAGNOSIS — I69398 Other sequelae of cerebral infarction: Secondary | ICD-10-CM

## 2016-11-30 DIAGNOSIS — R269 Unspecified abnormalities of gait and mobility: Secondary | ICD-10-CM | POA: Diagnosis not present

## 2016-11-30 DIAGNOSIS — G890 Central pain syndrome: Secondary | ICD-10-CM | POA: Diagnosis not present

## 2016-11-30 DIAGNOSIS — F17200 Nicotine dependence, unspecified, uncomplicated: Secondary | ICD-10-CM | POA: Diagnosis not present

## 2016-11-30 NOTE — Telephone Encounter (Signed)
Ann from the facility where patient lives is calling regarding the medication list that was sent back with the patient today after her visit. She said the Insulin medication seemed to be added? She was unsure if this was something new? Please call. Thanks

## 2016-11-30 NOTE — Progress Notes (Signed)
Follow-up Visit   Date: 11/30/16    Taylor [name] MRN: [id number] DOB: [birth year]   Interim History: Taylor [name] is a 36 y.o. left-handed Caucasian female with hypertension, diabetes mellitus, hypertension, bipolar disorder, depression, anxiety, bithalamic and right PCA territory stroke (2013), substance abuse returning to the clinic for follow-up of thalamic pain syndrome.  The patient was accompanied to the clinic by caregiver who also provides collateral information.    History of present illness: Patient was previously seen by Dr. Leta Baptist in 2014 for these same complaints who recommended that she establish care with pain management and psychiatry for ongoing issues with chronic pain and mood disorder.   She reports having left arm and leg pain which started following a prolonged hospital course in 2013. Patient was found unresponsive by family next to a bottle of Xanax on 05/07/2012. She was taken to [facility] she developed seizure activity, hypothermia, coffee-ground emesis, melanotic stools, and blood glucose of 1706. UDS positive for benzodiazepines. Patient was transferred to [facility] medical ICU and had a prolonged hospital course complicated by a right posterior cerebral artery ischemic infarction in the setting of right P2 stenosis and sepsis. MRI of the brain confirmed right PCA infarction along with bilateral thalamic strokes. Etiology thought to be embolic from sepsis causing hypercoagulable state. 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 Patient was discharged from the hospital 07/05/2012 to skilled nursing facility.  Patient continues to have issues with memory that include short term memory loss and lives at a skilled nursing facility. She has persistent left  arm and leg pain, sparing the face. Pain is sharp with intermittent tingling sensation. Pain is constant and worse with pressure. She takes gabapentin 300mg  BID and hydrocodone 5/325 (2 tablet) twice daily. The greatest relief is with hydrocodone which reduces her pain from 10 down to 2-3/10. She has not been on any other pain medications.  Patient is on Cymbalta 30mg  for mood disorder, Klonopin for anxiety, Keppra 500mg  BID for seizure control.   She has been off hydrocodone since December 2016 and remains on gabapentin 600mg  TID.  She has gained 30lb since July 2016 and attributes this to moving to an assisted facility where she does not walk as much.  Her caregiver also states often goes for second portions during meal times.  During 2017, she improved her diet choices and has lost 20lb.  Her paresthesias were well controlled on gabapentin 600mg , but later in the year they become more bothersome.   UPDATE 11/30/2016:  She is here for 6 month follow-up and continues to have left sided tingling and numbness.  Her MAR from her facility shows that she is taking gabapentin 600mg  TID and Cymbalta 30mg , which she takes for depression.  She remains off hydrocodone.  She feels about 50-75% relief with gabapentin, but would like to be asymptomatic and continues to have bothersome prickly sensation throughout the day. She continues to loose weight and is at 184lb.  Medications:  Current Outpatient Prescriptions on File Prior to Visit  Medication Sig Dispense Refill  . acetaminophen (TYLENOL) 325 MG tablet Take 650 mg by mouth every 6 (six) hours as needed. pain    . aspirin EC 81 MG tablet Take 81 mg by mouth daily.    . diazepam (VALIUM) 10 MG tablet Take 10 mg by mouth every  8 (eight) hours as needed.     . diclofenac sodium (VOLTAREN) 1 % GEL Apply 1 application topically 4 (four) times daily.    . DULoxetine (CYMBALTA) 30 MG capsule Take 30 mg by mouth daily.    Marland Kitchen gabapentin (NEURONTIN) 300 MG  capsule Take 3 capsules (900 mg total) by mouth 3 (three) times daily. 270 capsule 11  . HYDROcodone-acetaminophen (NORCO) 10-325 MG per tablet Take 1 tablet by mouth every 6 (six) hours as needed. 30 tablet 0  . labetalol (NORMODYNE) 300 MG tablet Take 300 mg by mouth 2 (two) times daily.    Marland Kitchen levETIRAcetam (KEPPRA XR) 500 MG 24 hr tablet Take 1,000 mg by mouth daily.    Marland Kitchen lisinopril (PRINIVIL,ZESTRIL) 20 MG tablet Take 40 mg by mouth daily.     Marland Kitchen omeprazole (PRILOSEC) 20 MG capsule Take 20 mg by mouth daily.    . risperiDONE (RISPERDAL) 1 MG tablet Take 1 mg by mouth at bedtime.    . traZODone (DESYREL) 100 MG tablet Take 100 mg by mouth at bedtime. 2 tablets po qhs    . insulin NPH (HUMULIN N,NOVOLIN N) 100 UNIT/ML injection Inject 22 Units into the skin daily with dinner.     No current facility-administered medications on file prior to visit.     Allergies:  Allergies  Allergen Reactions  . Compazine Other (See Comments)    Unknown  . Oxycodone Nausea And Vomiting    Severe patient will not take oxycodone or percocet.  . Prochlorperazine Other (See Comments)    Alert but unable to move  . Promethazine Swelling    Lips swell  . Sumatriptan Rash    Review of Systems:  CONSTITUTIONAL: No fevers, chills, night sweats, or weight loss.  EYES: No visual changes or eye pain ENT: No hearing changes.  No history of nose bleeds.   RESPIRATORY: No cough, wheezing and shortness of breath.   CARDIOVASCULAR: Negative for chest pain, and palpitations.   GI: Negative for abdominal discomfort, blood in stools or black stools.  No recent change in bowel habits.   GU:  No history of incontinence.   MUSCLOSKELETAL: No history of joint pain or swelling.  No myalgias.   SKIN: Negative for lesions, rash, and itching.   ENDOCRINE: Negative for cold or heat intolerance, polydipsia or goiter.   PSYCH:  + depression or anxiety symptoms.   NEURO: As Above.   Vital Signs:  BP 104/64   Pulse 86    Ht 5\' 4"  (1.626 m)   Wt 184 lb 4 oz (83.6 kg)   SpO2 95%   BMI 31.63 kg/m   Neurological Exam: MENTAL STATUS including orientation to time, place, person, recent and remote memory, attention span and concentration, language, and fund of knowledge is normal.  Speech is not dysarthric.  CRANIAL NERVES:  Pupils equal round and reactive to light.  Normal conjugate, extra-ocular eye movements in all directions of gaze.  Right ptosis (old).  Face is symmetric. Palate elevates symmetrically.  Tongue is midline.  MOTOR:  Motor strength is 5/5 in all extremities, except left lower leg is 5-/5.        SENSORY: Sensation reduced to all modalities over the left arm and leg and intact on the right side.  COORDINATION/GAIT:  Gait narrow based and stable, but she lifts the left leg up in a nonphysiological pattern.  Interestingly, she is able to perform heel, toe, and tandem gait with normal steppage.  Data: Results of stroke  evaluation per neurology note on 05/15/12: CTA head and neck = Narrowing of the P2 the segment of the right PCA. General irregularity of the bilateral posterior cerebral arteries. Paucity of vascularity in the right PCA territory. 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  Cardiolipins - Negative  Factor V Leiden mutation - negative  Factor II 20210 Mutation - negative  Hemoglobin A1c = 14.4  LDL = 37  TSH = 0.646  TTE - EF 55-60%  TEE - no vegetations   MRI brain 09/18/2012: 1. 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 2. Mild chronic microvascular ischemic changes in the pons and subcortical white matter. 3. No acute findings.  IMPRESSION/PLAN: 1. 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 She remains off hydrocodone Going forward, consider adding  nortriptyline  2. Gait abnormality is nonphysiologic and I stressed the importance of trying to focus on walking without left hip flexion.  Unfortunately, she is unable to get PT due to being on medicaid.  Home exercises were stressed.  3.  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 Obesity, praised her for her weight loss and encouraged her to continue exercise regimen with walking   5.  Tobacco use disorder.  Smoking cessation instruction/counseling given:  counseled patient on the dangers of tobacco use, advised patient to stop smoking, and reviewed strategies to maximize success  Return to clinic in 9 months   The duration of this appointment visit was 30 minutes of face-to-face time with the patient.  Greater than 50% of this time was spent in counseling, explanation of diagnosis, planning of further management, and coordination of care.   Thank you for allowing me to participate in patient's care.  If I can answer any additional questions, I would be pleased to do so.    Sincerely,    Zaydon Kinser K. Posey Pronto, DO

## 2016-11-30 NOTE — Patient Instructions (Addendum)
Increase gabapentin to 900mg  three times daily  Return to clinic in 9 months

## 2016-12-01 ENCOUNTER — Encounter: Payer: Self-pay | Admitting: *Deleted

## 2016-12-01 NOTE — Telephone Encounter (Signed)
I spoke with Taylor [name] and she was requesting an updated med list.  List faxed.

## 2017-01-19 ENCOUNTER — Other Ambulatory Visit: Payer: Self-pay | Admitting: Neurology

## 2017-01-20 ENCOUNTER — Other Ambulatory Visit: Payer: Self-pay | Admitting: *Deleted

## 2017-01-20 MED ORDER — GABAPENTIN 300 MG PO CAPS: 900.0000 mg | ORAL_CAPSULE | Freq: Three times a day (TID) | ORAL | 5 refills | Status: DC

## 2017-08-29 NOTE — Progress Notes (Signed)
Follow-up Visit   Date: 08/30/17    Taylor [name] MRN: [id number] DOB: [birth year]   Interim History: Taylor [name] is a 37 y.o. left-handed Caucasian female with hypertension, diabetes mellitus, hypertension, bipolar disorder, depression, anxiety, bithalamic and right PCA territory stroke (2013), substance abuse returning to the clinic for follow-up of thalamic pain syndrome.  The patient was accompanied to the clinic by caregiver who also provides collateral information.    History of present illness: Patient was previously seen by Dr. Leta Baptist in 2014 for these same complaints who recommended that she establish care with pain management and psychiatry for ongoing issues with chronic pain and mood disorder.   She reports having left arm and leg pain which started following a prolonged hospital course in 2013. Patient was found unresponsive by family next to a bottle of Xanax on 05/07/2012. She was taken to [facility] she developed seizure activity, hypothermia, coffee-ground emesis, melanotic stools, and blood glucose of 1706. UDS positive for benzodiazepines. Patient was transferred to [facility] medical ICU and had a prolonged hospital course complicated by a right posterior cerebral artery ischemic infarction in the setting of right P2 stenosis and sepsis. MRI of the brain confirmed right PCA infarction along with bilateral thalamic strokes. Etiology thought to be embolic from sepsis causing hypercoagulable state. 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 Patient was discharged from the hospital 07/05/2012 to skilled nursing facility.  Patient continues to have issues with memory that include short term memory loss and lives at a skilled nursing facility. She has persistent left arm  and leg pain, sparing the face. Pain is sharp with intermittent tingling sensation. Pain is constant and worse with pressure. She takes gabapentin 300mg  BID and hydrocodone 5/325 (2 tablet) twice daily. The greatest relief is with hydrocodone which reduces her pain from 10 down to 2-3/10. She has not been on any other pain medications.  Patient is on Cymbalta 30mg  for mood disorder, Klonopin for anxiety, Keppra 500mg  BID for seizure control.   She has been off hydrocodone since December 2016 and remains on gabapentin 600mg  TID.  She has gained 30lb since July 2016 and attributes this to moving to an assisted facility where she does not walk as much.  Her caregiver also states often goes for second portions during meal times.  During 2017, she improved her diet choices and has lost 20lb.  Her paresthesias were well controlled on gabapentin 600mg , but later in the year they become more bothersome.   UPDATE 11/30/2016:  She is here for 6 month follow-up and continues to have left sided tingling and numbness.  Her MAR from her facility shows that she is taking gabapentin 600mg  TID and Cymbalta 30mg , which she takes for depression.  She remains off hydrocodone.  She feels about 50-75% relief with gabapentin, but would like to be asymptomatic and continues to have bothersome prickly sensation throughout the day. She continues to loose weight and is at 184lb.  UPDATE 08/30/2017:  She is here for 9 month follow-up visit.  She did appreciate improvement of left leg numbness/tingling with increasing gabapentin to 900mg  TID, but still has breakthrough pain which is bothersome.  She would like to have no pain, but it was explained with her old thalamic stroke, she will have some degree of residual pain.  She also complains of dull  and achy low back pain, which does not radiate into the legs. She denies any new weakness or falls. She is still smoking 0.5 PPD.  Medications:  Current Outpatient Medications on File  Prior to Visit  Medication Sig Dispense Refill  . acetaminophen (TYLENOL) 325 MG tablet Take 650 mg by mouth every 6 (six) hours as needed. pain    . aspirin EC 81 MG tablet Take 81 mg by mouth daily.    . diazepam (VALIUM) 10 MG tablet Take 10 mg by mouth every 8 (eight) hours as needed.     . diclofenac sodium (VOLTAREN) 1 % GEL Apply 1 application topically 4 (four) times daily.    . DULoxetine (CYMBALTA) 60 MG capsule Take by mouth.    . labetalol (NORMODYNE) 300 MG tablet Take 300 mg by mouth 2 (two) times daily.    Marland Kitchen levETIRAcetam (KEPPRA XR) 500 MG 24 hr tablet Take 1,000 mg by mouth daily.    Marland Kitchen lisinopril (PRINIVIL,ZESTRIL) 20 MG tablet Take 40 mg by mouth daily.     Marland Kitchen omeprazole (PRILOSEC) 20 MG capsule Take 20 mg by mouth daily.    . risperiDONE (RISPERDAL) 1 MG tablet Take 1 mg by mouth at bedtime.    . traZODone (DESYREL) 100 MG tablet Take 100 mg by mouth at bedtime. 2 tablets po qhs     No current facility-administered medications on file prior to visit.     Allergies:  Allergies  Allergen Reactions  . Compazine Other (See Comments)    Unknown  . Oxycodone Nausea And Vomiting    Severe patient will not take oxycodone or percocet.  . Prochlorperazine Other (See Comments)    Alert but unable to move  . Promethazine Swelling    Lips swell  . Sumatriptan Rash    Review of Systems:  CONSTITUTIONAL: No fevers, chills, night sweats, or weight loss.  EYES: No visual changes or eye pain ENT: No hearing changes.  No history of nose bleeds.   RESPIRATORY: No cough, wheezing and shortness of breath.   CARDIOVASCULAR: Negative for chest pain, and palpitations.   GI: Negative for abdominal discomfort, blood in stools or black stools.  No recent change in bowel habits.   GU:  No history of incontinence.   MUSCLOSKELETAL: No history of joint pain or swelling.  No myalgias.   SKIN: Negative for lesions, rash, and itching.   ENDOCRINE: Negative for cold or heat intolerance,  polydipsia or goiter.   PSYCH:  + depression or anxiety symptoms.   NEURO: As Above.   Vital Signs:  BP 100/60   Pulse 83   Ht 5\' 4"  (1.626 m)   Wt 165 lb (74.8 kg)   SpO2 96%   BMI 28.32 kg/m   General:  Well appearing, comfortable  Neurological Exam: MENTAL STATUS including orientation to time, place, person, recent and remote memory, attention span and concentration, language, and fund of knowledge is normal.  Speech is not dysarthric.  CRANIAL NERVES:  Pupils equal round and reactive to light.  Normal conjugate, extra-ocular eye movements in all directions of gaze.  No ptosis.  Face is symmetric. Palate elevates symmetrically.  Tongue is midline.  MOTOR:  Motor strength is 5/5 in all extremities, except LLE 5-/5.  No atrophy, fasciculations or abnormal movements.  No pronator drift.  Tone is normal.    MSRs:  Reflexes are 2+/4 throughout, except reduced at the achilles.  SENSORY:  Intact to to vibration throughout.  COORDINATION/GAIT:  Normal finger-to-  nose-finger.  Intact rapid alternating movements bilaterally.  Nonphysiological gait with exaggerated left hip flexion.  Tandem and stressed gait intact and appears normal  Data: Results of stroke evaluation per neurology note on 05/15/12: CTA head and neck = Narrowing of the P2 the segment of the right PCA. General irregularity of the bilateral posterior cerebral arteries. Paucity of vascularity in the right PCA territory. 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  Cardiolipins - Negative  Factor V Leiden mutation - negative  Factor II 20210 Mutation - negative  Hemoglobin A1c = 14.4  LDL = 37  TSH = 0.646  TTE - EF 55-60%  TEE - no vegetations   MRI brain 09/18/2012: 1. 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 2. Mild chronic microvascular ischemic changes in the pons and subcortical white matter. 3. No acute  findings.  IMPRESSION/PLAN: 1. 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 Functional gait abnormality. Encouraged patient to walk normally, as she is able to do this with heel/toe and tandem walking.    3.  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  Tobacco abuse with history of stroke.  Currently smoking 0.5 packs/day.  Patient was informed of the dangers of tobacco abuse including stroke, cancer, and MI, as well as benefits of tobacco cessation.  Patient is not willing to quit at this time.  Approximately 4 mins were spent counseling patient cessation techniques. We discussed various methods to help quit smoking, including deciding on a date to quit, joining a support group, pharmacological agents- nicotine gum/patch/lozenges, chantix. I will reassess her progress at the next follow-up visit  Return to clinic in 9 months   Thank you for allowing me to participate in patient's care.  If I can answer any additional questions, I would be pleased to do so.    Sincerely,    Deryl Ports K. Posey Pronto, DO

## 2017-08-30 ENCOUNTER — Ambulatory Visit (INDEPENDENT_AMBULATORY_CARE_PROVIDER_SITE_OTHER): Payer: Medicaid Other | Admitting: Neurology

## 2017-08-30 ENCOUNTER — Encounter: Payer: Self-pay | Admitting: Neurology

## 2017-08-30 ENCOUNTER — Telehealth: Payer: Self-pay | Admitting: Neurology

## 2017-08-30 VITALS — BP 100/60 | HR 83 | Ht 64.0 in | Wt 165.0 lb

## 2017-08-30 DIAGNOSIS — F17200 Nicotine dependence, unspecified, uncomplicated: Secondary | ICD-10-CM

## 2017-08-30 DIAGNOSIS — R269 Unspecified abnormalities of gait and mobility: Secondary | ICD-10-CM

## 2017-08-30 DIAGNOSIS — G890 Central pain syndrome: Secondary | ICD-10-CM

## 2017-08-30 MED ORDER — NORTRIPTYLINE HCL 10 MG PO CAPS: ORAL_CAPSULE | ORAL | 5 refills | Status: DC

## 2017-08-30 MED ORDER — GABAPENTIN 300 MG PO CAPS: 900.0000 mg | ORAL_CAPSULE | Freq: Three times a day (TID) | ORAL | 5 refills | Status: DC

## 2017-08-30 NOTE — Telephone Encounter (Signed)
I cancelled the Rx and notified Ms. Goodhart that we will not try the nortriptyline.

## 2017-08-30 NOTE — Telephone Encounter (Signed)
In that case, do not start nortriptyline - I will cancel the Rx - please call pharmarcy and cancel the order.  Stay on gabapentin 900mg  TID.

## 2017-08-30 NOTE — Telephone Encounter (Signed)
Please advise 

## 2017-08-30 NOTE — Telephone Encounter (Signed)
Please find out what reaction she had to amitriptyline, so I can decide whether to start nortriptyline or not. Thanks.

## 2017-08-30 NOTE — Telephone Encounter (Signed)
I spoke with patient's mom and she said that patient ended up in the ER not breathing after taking the amitriptyline.

## 2017-08-30 NOTE — Addendum Note (Signed)
Addended by: Alda Berthold on: 08/30/2017 11:54 AM   Modules accepted: Orders

## 2017-08-30 NOTE — Patient Instructions (Addendum)
Start nortriptyline 10mg  at bedtime for 2 week, then increase to 2 tablet at bedtime  Start gabapentin 900mg  three times daily  Return to clinic in 9 months

## 2017-08-30 NOTE — Telephone Encounter (Signed)
Patient was seen in our office today with Dr. Posey Pronto. She wrote a prescription for the patient to start Nortriptyline but in the past she has had a reaction to Amitriptyline. The pharmacy didn't feel comfortable filling it. Please Call. Thanks

## 2017-09-01 ENCOUNTER — Telehealth: Payer: Self-pay | Admitting: Neurology

## 2017-09-01 ENCOUNTER — Other Ambulatory Visit: Payer: Self-pay | Admitting: *Deleted

## 2017-09-01 ENCOUNTER — Encounter: Payer: Self-pay | Admitting: *Deleted

## 2018-06-01 ENCOUNTER — Ambulatory Visit: Payer: Medicaid Other | Admitting: Neurology

## 2018-06-01 NOTE — Progress Notes (Deleted)
Follow-up Visit   Date: 06/01/18    Taylor [name] MRN: [id number] DOB: 16-Nov-1980   Interim History: Taylor [name] is a 37 y.o. left-handed Caucasian female with hypertension, diabetes mellitus, hypertension, bipolar disorder, depression, anxiety, bithalamic and right PCA territory stroke (2013), substance abuse returning to the clinic for follow-up of thalamic pain syndrome.  The patient was accompanied to the clinic by caregiver who also provides collateral information.    History of present illness: Patient was previously seen by Dr. Leta Baptist in 2014 for these same complaints who recommended that she establish care with pain management and psychiatry for ongoing issues with chronic pain and mood disorder.   She reports having left arm and leg pain which started following a prolonged hospital course in 2013. Patient was found unresponsive by family next to a bottle of Xanax on 05/07/2012. She was taken to [facility] she developed seizure activity, hypothermia, coffee-ground emesis, melanotic stools, and blood glucose of 1706. UDS positive for benzodiazepines. Patient was transferred to [facility] medical ICU and had a prolonged hospital course complicated by a right posterior cerebral artery ischemic infarction in the setting of right P2 stenosis and sepsis. MRI of the brain confirmed right PCA infarction along with bilateral thalamic strokes. Etiology thought to be embolic from sepsis causing hypercoagulable state. 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 Patient was discharged from the hospital 07/05/2012 to skilled nursing facility.  Patient continues to have issues with memory that include short term memory loss and lives at a skilled nursing facility. She has persistent left arm  and leg pain, sparing the face. Pain is sharp with intermittent tingling sensation. Pain is constant and worse with pressure. She takes gabapentin 300mg  BID and hydrocodone 5/325 (2 tablet) twice daily. The greatest relief is with hydrocodone which reduces her pain from 10 down to 2-3/10. She has not been on any other pain medications.  Patient is on Cymbalta 30mg  for mood disorder, Klonopin for anxiety, Keppra 500mg  BID for seizure control.   She has been off hydrocodone since December 2016 and remains on gabapentin 600mg  TID.  She has gained 30lb since July 2016 and attributes this to moving to an assisted facility where she does not walk as much.  Her caregiver also states often goes for second portions during meal times.  During 2017, she improved her diet choices and has lost 20lb.  Her paresthesias were well controlled on gabapentin 600mg , but later in the year they become more bothersome.   UPDATE 11/30/2016:  She is here for 6 month follow-up and continues to have left sided tingling and numbness.  Her MAR from her facility shows that she is taking gabapentin 600mg  TID and Cymbalta 30mg , which she takes for depression.  She remains off hydrocodone.  She feels about 50-75% relief with gabapentin, but would like to be asymptomatic and continues to have bothersome prickly sensation throughout the day. She continues to loose weight and is at 184lb.  UPDATE 08/30/2017:  She is here for 9 month follow-up visit.  She did appreciate improvement of left leg numbness/tingling with increasing gabapentin to 900mg  TID, but still has breakthrough pain which is bothersome.  She would like to have no pain, but it was explained with her old thalamic stroke, she will have some degree of residual pain.  She also complains of dull  and achy low back pain, which does not radiate into the legs. She denies any new weakness or falls. She is still smoking 0.5 PPD.  Medications:  Current Outpatient Medications on File  Prior to Visit  Medication Sig Dispense Refill  . acetaminophen (TYLENOL) 325 MG tablet Take 650 mg by mouth every 6 (six) hours as needed. pain    . aspirin EC 81 MG tablet Take 81 mg by mouth daily.    . diazepam (VALIUM) 10 MG tablet Take 10 mg by mouth every 8 (eight) hours as needed.     . diclofenac sodium (VOLTAREN) 1 % GEL Apply 1 application topically 4 (four) times daily.    . DULoxetine (CYMBALTA) 60 MG capsule Take by mouth.    . gabapentin (NEURONTIN) 300 MG capsule Take 3 capsules (900 mg total) by mouth 3 (three) times daily. 270 capsule 5  . labetalol (NORMODYNE) 300 MG tablet Take 300 mg by mouth 2 (two) times daily.    Marland Kitchen levETIRAcetam (KEPPRA XR) 500 MG 24 hr tablet Take 1,000 mg by mouth daily.    Marland Kitchen lisinopril (PRINIVIL,ZESTRIL) 20 MG tablet Take 40 mg by mouth daily.     Marland Kitchen omeprazole (PRILOSEC) 20 MG capsule Take 20 mg by mouth daily.    . risperiDONE (RISPERDAL) 1 MG tablet Take 1 mg by mouth at bedtime.    . traZODone (DESYREL) 100 MG tablet Take 100 mg by mouth at bedtime. 2 tablets po qhs     No current facility-administered medications on file prior to visit.     Allergies:  Allergies  Allergen Reactions  . Amitriptyline   . Compazine Other (See Comments)    Unknown  . Oxycodone Nausea And Vomiting    Severe patient will not take oxycodone or percocet.  . Prochlorperazine Other (See Comments)    Alert but unable to move  . Promethazine Swelling    Lips swell  . Sumatriptan Rash    Review of Systems:  CONSTITUTIONAL: No fevers, chills, night sweats, or weight loss.  EYES: No visual changes or eye pain ENT: No hearing changes.  No history of nose bleeds.   RESPIRATORY: No cough, wheezing and shortness of breath.   CARDIOVASCULAR: Negative for chest pain, and palpitations.   GI: Negative for abdominal discomfort, blood in stools or black stools.  No recent change in bowel habits.   GU:  No history of incontinence.   MUSCLOSKELETAL: No history of joint  pain or swelling.  No myalgias.   SKIN: Negative for lesions, rash, and itching.   ENDOCRINE: Negative for cold or heat intolerance, polydipsia or goiter.   PSYCH:  + depression or anxiety symptoms.   NEURO: As Above.   Vital Signs:  There were no vitals taken for this visit.  General:  Well appearing, comfortable  Neurological Exam: MENTAL STATUS including orientation to time, place, person, recent and remote memory, attention span and concentration, language, and fund of knowledge is normal.  Speech is not dysarthric.  CRANIAL NERVES:  Pupils equal round and reactive to light.  Normal conjugate, extra-ocular eye movements in all directions of gaze.  No ptosis.  Face is symmetric. Palate elevates symmetrically.  Tongue is midline.  MOTOR:  Motor strength is 5/5 in all extremities, except LLE 5-/5.  No atrophy, fasciculations or abnormal movements.  No pronator drift.  Tone is normal.    MSRs:  Reflexes are 2+/4 throughout, except reduced at the achilles.  SENSORY:  Intact to to vibration throughout.  COORDINATION/GAIT:  Normal finger-to- nose-finger.  Intact rapid alternating movements bilaterally.  Nonphysiological gait with exaggerated left hip flexion.  Tandem and stressed gait intact and appears normal  Data: Results of stroke evaluation per neurology note on 05/15/12: CTA head and neck = Narrowing of the P2 the segment of the right PCA. General irregularity of the bilateral posterior cerebral arteries. Paucity of vascularity in the right PCA territory. 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  Cardiolipins - Negative  Factor V Leiden mutation - negative  Factor II 20210 Mutation - negative  Hemoglobin A1c = 14.4  LDL = 37  TSH = 0.646  TTE - EF 55-60%  TEE - no vegetations   MRI brain 09/18/2012: 1. 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 2. Mild chronic microvascular ischemic changes in the  pons and subcortical white matter. 3. No acute findings.  IMPRESSION/PLAN: 1. 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 Functional gait abnormality. Encouraged patient to walk normally, as she is able to do this with heel/toe and tandem walking.    3.  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  Tobacco cessation counseling.  She had history of stroke and continues to smoke *** PPD.  I have stressed the importance of cessation on multiple prior visits.  She is not interested in quitting, despite discussing risk of repeat stroke, increased chances of cancer and CAD.  Approximately 4 mins were spent counseling patient cessation techniques. We discussed various methods to help quit smoking, including deciding on a date to quit, joining a support group, pharmacological agents- nicotine gum/patch/lozenges, chantix.  I will reassess her progress at the next follow-up visit  Return to clinic in ***  Thank you for allowing me to participate in patient's care.  If I can answer any additional questions, I would be pleased to do so.    Sincerely,    Thedford Bunton K. Posey Pronto, DO

## 2018-06-17 ENCOUNTER — Other Ambulatory Visit: Payer: Self-pay | Admitting: Neurology

## 2018-10-01 ENCOUNTER — Other Ambulatory Visit: Payer: Self-pay | Admitting: Neurology

## 2018-10-12 ENCOUNTER — Other Ambulatory Visit: Payer: Self-pay | Admitting: Neurology

## 2018-10-16 ENCOUNTER — Telehealth: Payer: Self-pay | Admitting: Neurology

## 2018-10-16 ENCOUNTER — Other Ambulatory Visit: Payer: Self-pay | Admitting: Neurology

## 2018-10-16 MED ORDER — GABAPENTIN 300 MG PO CAPS: 900.0000 mg | ORAL_CAPSULE | Freq: Three times a day (TID) | ORAL | 5 refills | Status: DC

## 2018-10-16 NOTE — Telephone Encounter (Signed)
Mom called and stated she was told she needed to schedule an E-visit for patient so that she could get a refill on her medication. She is now scheduled for 10/24/18. Please send this refill in. She will be out of medication by tomorrow. Thanks!

## 2018-10-16 NOTE — Telephone Encounter (Signed)
Refilled and sent Rx gabapentine 300mg   #270, R-5 to [facility]

## 2018-10-16 NOTE — Progress Notes (Signed)
R

## 2018-10-24 ENCOUNTER — Other Ambulatory Visit: Payer: Self-pay

## 2018-10-24 ENCOUNTER — Telehealth (INDEPENDENT_AMBULATORY_CARE_PROVIDER_SITE_OTHER): Payer: Medicaid Other | Admitting: Neurology

## 2018-10-24 VITALS — Ht 63.0 in | Wt 240.0 lb

## 2018-10-24 DIAGNOSIS — G890 Central pain syndrome: Secondary | ICD-10-CM

## 2018-10-24 DIAGNOSIS — G40909 Epilepsy, unspecified, not intractable, without status epilepticus: Secondary | ICD-10-CM

## 2018-10-24 DIAGNOSIS — R635 Abnormal weight gain: Secondary | ICD-10-CM

## 2018-10-24 DIAGNOSIS — F17200 Nicotine dependence, unspecified, uncomplicated: Secondary | ICD-10-CM

## 2018-10-24 DIAGNOSIS — R269 Unspecified abnormalities of gait and mobility: Secondary | ICD-10-CM

## 2018-10-24 DIAGNOSIS — R252 Cramp and spasm: Secondary | ICD-10-CM

## 2018-10-24 MED ORDER — BACLOFEN 10 MG PO TABS: ORAL_TABLET | ORAL | 0 refills | Status: DC

## 2018-10-24 NOTE — Progress Notes (Signed)
Virtual Visit via Video Note The purpose of this virtual visit is to provide medical care while limiting exposure to the novel coronavirus.    Consent was obtained for video visit:  Yes.   Answered questions that patient had about telehealth interaction:  Yes.   I discussed the limitations, risks, security and privacy concerns of performing an evaluation and management service by telemedicine. I also discussed with the patient that there may be a patient responsible charge related to this service. The patient expressed understanding and agreed to proceed.  Pt location: Home Physician Location: office Name of referring provider:  Monico Blitz, MD I connected with Taylor [name] at patients initiation/request on 10/24/2018 at  9:00 AM EDT by video enabled telemedicine application and verified that I am speaking with the correct person using two identifiers. Pt MRN:  [id number] Pt DOB:  08-01-1980 Video Participants:  Taylor [name]; mother   History of Present Illness: This is a 38 y.o. female with history of bithalamic and right PCA stroke (2013) returning for follow-up of thalamic pain syndrome and new complaints of left leg cramping.  She was last seen in March 2019 and is requesting refills for gabapentin. She moved out of her group home in August and is now living with her parents.  Left-sided pain is better controlled on gabapentin 900 mg 3 times daily.  She was prescribed nortriptyline at the last visit, but she did not take this due to intolerance of amitriptyline.   She also complains of almost 80 pound weight gain since August.  She admits to eating and snacking more frequently when at home and is much less active.  She is trying to quit smoking, at the request of her mother who has heart disease.  She has been on Keppra XR 1000 mg daily for seizures, which are very well-controlled.    Observations/Objective:   Vitals:   10/24/18 0832  Weight: 240 lb (108.9 kg)  Height: 5\' 3"  (1.6  m)   Patient is awake, alert, and appears comfortable.  Oriented x 4.  Morbidly obese. Extraocular muscles are intact. No ptosis.  Face is symmetric.  Speech is not dysarthric. Tongue is midline. Antigravity in all extremities.  No pronator drift. Gait appears functional with exaggerated left hip flexion, stable and unassisted (old).  Assessment and Plan:  1. 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  Left leg cramps  -Start baclofen 5 mg at bedtime for 1 week then increased 10 mg at bedtime  -Strongly encouraged her to start leg stretches and an exercise program  3.  Functional gait abnormality, stable  4.  Seizure disorder, most likely provoked in 2013.  She has remained stable on Keppra XR 1000mg  daily.  I can consider tapering in the future, if EEG is normal, as she has remained seizure free.   5.  Significant weight gain of almost 80 pounds since August 2019.  I suspect this is due to poor diet habits but have recommended that she talk to her PCP to rule out other possibilities such as thyroid disease.  I counseled her on healthy lifestyle changes including diet and exercise.  6.  Tobacco abuse.  She is currently smoking half a pack per day and expresses interest in quitting. Patient was informed of the dangers of tobacco abuse including stroke, cancer, and MI, as  well as benefits of tobacco cessation.5 Approximately 5 mins were spent counseling patient cessation techniques. We discussed various methods to help quit smoking, including deciding on a date to quit, joining a support group, pharmacological agents- nicotine gum/patch/lozenges, chantix. I will reassess her progress at the next follow-up visit.   Follow Up Instructions:   I discussed the assessment and treatment plan with the patient. The patient was provided an opportunity to  ask questions and all were answered. The patient agreed with the plan and demonstrated an understanding of the instructions.   The patient was advised to call back or seek an in-person evaluation if the symptoms worsen or if the condition fails to improve as anticipated.  Follow-up in 1 year   Alda Berthold, DO

## 2018-11-14 ENCOUNTER — Emergency Department (HOSPITAL_COMMUNITY): Payer: Medicaid Other

## 2018-11-14 ENCOUNTER — Other Ambulatory Visit: Payer: Self-pay

## 2018-11-14 ENCOUNTER — Emergency Department (HOSPITAL_COMMUNITY)
Admission: EM | Admit: 2018-11-14 | Discharge: 2018-11-14 | Disposition: A | Discharge status: Home / Self Care | Payer: Medicaid Other | Attending: Emergency Medicine | Admitting: Emergency Medicine

## 2018-11-14 ENCOUNTER — Encounter (HOSPITAL_COMMUNITY): Payer: Self-pay

## 2018-11-14 DIAGNOSIS — G43009 Migraine without aura, not intractable, without status migrainosus: Secondary | ICD-10-CM | POA: Diagnosis not present

## 2018-11-14 DIAGNOSIS — F17210 Nicotine dependence, cigarettes, uncomplicated: Secondary | ICD-10-CM | POA: Insufficient documentation

## 2018-11-14 DIAGNOSIS — R072 Precordial pain: Secondary | ICD-10-CM | POA: Insufficient documentation

## 2018-11-14 DIAGNOSIS — E119 Type 2 diabetes mellitus without complications: Secondary | ICD-10-CM | POA: Diagnosis not present

## 2018-11-14 DIAGNOSIS — Z8673 Personal history of transient ischemic attack (TIA), and cerebral infarction without residual deficits: Secondary | ICD-10-CM | POA: Insufficient documentation

## 2018-11-14 DIAGNOSIS — R51 Headache: Secondary | ICD-10-CM | POA: Diagnosis present

## 2018-11-14 LAB — CBC WITH DIFFERENTIAL/PLATELET
Abs Immature Granulocytes: 0.06 10*3/uL (ref 0.00–0.07)
Basophils Absolute: 0 10*3/uL (ref 0.0–0.1)
Basophils Relative: 1 %
Eosinophils Absolute: 0.2 10*3/uL (ref 0.0–0.5)
Eosinophils Relative: 2 %
HCT: 37.2 % (ref 36.0–46.0)
Hemoglobin: 11.7 g/dL — ABNORMAL LOW (ref 12.0–15.0)
Immature Granulocytes: 1 %
Lymphocytes Relative: 22 %
Lymphs Abs: 1.8 10*3/uL (ref 0.7–4.0)
MCH: 25.9 pg — ABNORMAL LOW (ref 26.0–34.0)
MCHC: 31.5 g/dL (ref 30.0–36.0)
MCV: 82.3 fL (ref 80.0–100.0)
Monocytes Absolute: 0.4 10*3/uL (ref 0.1–1.0)
Monocytes Relative: 5 %
Neutro Abs: 5.8 10*3/uL (ref 1.7–7.7)
Neutrophils Relative %: 69 %
Platelets: 183 10*3/uL (ref 150–400)
RBC: 4.52 MIL/uL (ref 3.87–5.11)
RDW: 14.7 % (ref 11.5–15.5)
WBC: 8.3 10*3/uL (ref 4.0–10.5)
nRBC: 0 % (ref 0.0–0.2)

## 2018-11-14 LAB — URINALYSIS, ROUTINE W REFLEX MICROSCOPIC
Bilirubin Urine: NEGATIVE
Glucose, UA: 50 mg/dL — AB
Hgb urine dipstick: NEGATIVE
Ketones, ur: NEGATIVE mg/dL
Leukocytes,Ua: NEGATIVE
Nitrite: NEGATIVE
Protein, ur: NEGATIVE mg/dL
Specific Gravity, Urine: 1.009 (ref 1.005–1.030)
pH: 6 (ref 5.0–8.0)

## 2018-11-14 LAB — POC URINE PREG, ED: Preg Test, Ur: NEGATIVE

## 2018-11-14 LAB — TROPONIN I: Troponin I: <0.03 ng/mL (ref <0.03)

## 2018-11-14 LAB — BASIC METABOLIC PANEL
Anion gap: 11 (ref 5–15)
BUN: 14 mg/dL (ref 6–20)
CO2: 23 mmol/L (ref 22–32)
Calcium: 9 mg/dL (ref 8.9–10.3)
Chloride: 101 mmol/L (ref 98–111)
Creatinine, Ser: 0.72 mg/dL (ref 0.44–1.00)
GFR calc Af Amer: >60 mL/min (ref >60)
GFR calc non Af Amer: >60 mL/min (ref >60)
Glucose, Bld: 235 mg/dL — ABNORMAL HIGH (ref 70–99)
Potassium: 4 mmol/L (ref 3.5–5.1)
Sodium: 135 mmol/L (ref 135–145)

## 2018-11-14 MED ORDER — METOCLOPRAMIDE HCL 10 MG PO TABS: 10.0000 mg | ORAL_TABLET | Freq: Three times a day (TID) | ORAL | 0 refills | Status: DC | PRN

## 2018-11-14 MED ORDER — METOCLOPRAMIDE HCL 5 MG/ML IJ SOLN
10.0000 mg | Freq: Once | INTRAMUSCULAR | Status: AC
  Administered 2018-11-14: 10 mg via INTRAVENOUS
  Filled 2018-11-14: qty 2

## 2018-11-14 MED ORDER — DIPHENHYDRAMINE HCL 50 MG/ML IJ SOLN
25.0000 mg | Freq: Once | INTRAMUSCULAR | Status: AC
  Administered 2018-11-14: 25 mg via INTRAVENOUS
  Filled 2018-11-14: qty 1

## 2018-11-14 MED ORDER — KETOROLAC TROMETHAMINE 30 MG/ML IJ SOLN
30.0000 mg | Freq: Once | INTRAMUSCULAR | Status: AC
  Administered 2018-11-14: 30 mg via INTRAVENOUS
  Filled 2018-11-14: qty 1

## 2018-11-14 MED ORDER — ALUM & MAG HYDROXIDE-SIMETH 200-200-20 MG/5ML PO SUSP
15.0000 mL | Freq: Once | ORAL | Status: AC
  Administered 2018-11-14: 15 mL via ORAL
  Filled 2018-11-14: qty 30

## 2018-11-14 NOTE — Discharge Instructions (Signed)

## 2018-11-14 NOTE — ED Notes (Signed)
Pt pulled dentures out. Difficult to understand, but reports speech is normal for her

## 2018-11-14 NOTE — ED Provider Notes (Signed)
Emergency Department Provider Note   I have reviewed the triage vital signs and the nursing notes.   HISTORY  Chief Complaint Headache   HPI Taylor [name] is a 38 y.o. female with PMH of Bipolar, migraine HA, HTN, CKD, prior CVA with baseline left sided deficits presents to the emergency department for evaluation of chest pain with headache.  Patient describes chest pain starting last night.  She states it is been constant throughout the evening and this morning.  No radiation of symptoms or modifying factors.  She states that somewhat sharp and central in her chest.  Denies any shortness of breath or heart palpitations.  No fevers or cough.  Patient also states she is developed a migraine type headache starting this morning.  Patient awoke with these symptoms.  Her headache is frontal and she has some associated photophobia and phonophobia.  No fevers or chills.  No neck discomfort.  Patient states she has a Taylor [name] history of migraine headaches and this feels similar to those headache episodes.  Patient states that a family member was concerned she could have had some slurred speech earlier but she did not appreciate this.  The patient does wear dentures and states that her speech is sometimes difficult to understand.  Past Medical History:  Diagnosis Date   Anxiety    Bipolar disorder ([place])    Cerebral artery occlusion with cerebral infarction ([place]) 11/13   due to elevated blood sugar 1700-off insulin 6 months   Depression    Diabetes mellitus    off insulin 6 months   Headache(784.0)    Hypertension    Kidney dialysis status    on dialysis(stroke) off after 4-6 weeks   Respiratory failure ([place]) 11/13   with stroke - in hosp [facility] 12 weeks   Seizures ([place])    during elvated blood sugsr episode 11/13   Sepsis ([place])    Vocal cord dysfunction     Patient Active Problem List   Diagnosis Date Noted   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   Dejerine  Roussy syndrome 01/02/2015   Tobacco use disorder 01/02/2015   Cognitive impairment 01/02/2015   Stroke ([place]) 09/07/2012   Pain in upper limb 09/07/2012   Pain in lower limb 09/07/2012   Bipolar affective disorder ([place]) 09/07/2012   Depressive disorder 09/07/2012   Type II diabetes mellitus ([place]) 09/07/2012   Memory loss 09/07/2012   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   Personal history of noncompliance with medical treatment, presenting hazards to health 05/25/2012   Cerebral artery occlusion with cerebral infarction ([place]) 05/10/2012    Past Surgical History:  Procedure Laterality Date   APPENDECTOMY     INSERTION OF DIALYSIS CATHETER  11/13   removed in 4-6 weeks   MULTIPLE EXTRACTIONS WITH ALVEOLOPLASTY N/A 02/24/2014   Procedure: MULTIPLE EXTRACTION WITH ALVEOLOPLASTY AND BIOPSY;  Surgeon: Gae Bon, DDS;  Location: [facility];  Service: Oral Surgery;  Laterality: N/A;   TRACHEOSTOMY  11/13   closed 1/14   TRANSRECTAL DRAINAGE OF PELVIC ABSCESS  12/11    Allergies Amitriptyline; Compazine; Oxycodone; Prochlorperazine; Promethazine; and Sumatriptan  Family History  Problem Relation Age of Onset   Lung cancer Maternal Grandmother    Heart attack Maternal Grandfather    Diabetes Paternal Grandfather     Social History Social History   Tobacco Use   Smoking status: Current Every Day Smoker    Packs/day: 0.60    Years: 18.00    Pack years: 10.80  Types: Cigarettes   Smokeless tobacco: Never Used  Substance Use Topics   Alcohol use: No    Alcohol/week: 0.0 standard drinks   Drug use: No    Review of Systems  Constitutional: No fever/chills Eyes: No visual changes. ENT: No sore throat. Cardiovascular: Positive chest pain. Respiratory: Denies shortness of breath. Gastrointestinal: No abdominal pain.  No nausea, no vomiting.  No diarrhea.  No constipation. Genitourinary: Negative for dysuria. Musculoskeletal: Negative for back  pain. Skin: Negative for rash. Neurological: Negative for focal weakness or numbness. Positive HA.   10-point ROS otherwise negative.  ____________________________________________   PHYSICAL EXAM:  VITAL SIGNS: ED Triage Vitals  Enc Vitals Group     BP 11/14/18 1316 124/73     Pulse Rate 11/14/18 1316 88     Resp 11/14/18 1316 12     Temp 11/14/18 1316 98.2 F (36.8 C)     Temp Source 11/14/18 1316 Oral     SpO2 11/14/18 1316 98 %     Weight 11/14/18 1323 180 lb (81.6 kg)     Height 11/14/18 1323 5\' 3"  (1.6 m)     Pain Score 11/14/18 1321 6   Constitutional: Alert and oriented. Well appearing and in no acute distress. Eyes: Conjunctivae are normal. PERRL. EOMI. Head: Atraumatic. Nose: No congestion/rhinnorhea. Mouth/Throat: Mucous membranes are moist.  Neck: No stridor. Cardiovascular: Normal rate, regular rhythm. Good peripheral circulation. Grossly normal heart sounds.   Respiratory: Normal respiratory effort.  No retractions. Lungs CTAB. Gastrointestinal: Soft and nontender. No distention.  Musculoskeletal: No lower extremity tenderness nor edema. No gross deformities of extremities. Neurologic: Patient difficult to understand but no wearing her dentures. Baseline 4+/5 weakness in the LLE. No other neuro deficits appreciated.  Skin:  Skin is warm, dry and intact. No rash noted.  ____________________________________________   LABS (all labs ordered are listed, but only abnormal results are displayed)  Labs Reviewed  BASIC METABOLIC PANEL - Abnormal; Notable for the following components:      Result Value   Glucose, Bld 235 (*)    All other components within normal limits  CBC WITH DIFFERENTIAL/PLATELET - Abnormal; Notable for the following components:   Hemoglobin 11.7 (*)    MCH 25.9 (*)    All other components within normal limits  URINALYSIS, ROUTINE W REFLEX MICROSCOPIC - Abnormal; Notable for the following components:   Glucose, UA 50 (*)    All other  components within normal limits  TROPONIN I  POC URINE PREG, ED  I-STAT BETA HCG BLOOD, ED ([facility], [facility], [facility] ONLY)   ____________________________________________  EKG   EKG Interpretation  Date/Time:  Wednesday November 14 2018 13:25:24 EDT Ventricular Rate:  88 PR Interval:    QRS Duration: 95 QT Interval:  351 QTC Calculation: 425 R Axis:   -77 Text Interpretation:  Sinus rhythm S1,S2,S3 pattern Low voltage, precordial leads Borderline T abnormalities, diffuse leads No STEMI  Confirmed by Nanda Quinton [phone]) on 11/14/2018 3:53:46 PM       ____________________________________________  RADIOLOGY  Dg Chest Portable 1 View  Result Date: 11/14/2018 CLINICAL DATA:  Shortness of breath and cough. EXAM: PORTABLE CHEST 1 VIEW COMPARISON:  PA and lateral chest 09/26/2018 and single-view of the chest 08/29/2015. FINDINGS: The lungs are clear. Heart size is normal. No pneumothorax or pleural fluid. No acute or focal bony abnormality. IMPRESSION: Negative chest. Electronically Signed   By: Inge Rise M.D.   On: 11/14/2018 15:35    ____________________________________________   PROCEDURES  Procedure(s)  performed:   Procedures  None  ____________________________________________   INITIAL IMPRESSION / ASSESSMENT AND PLAN / ED COURSE  Pertinent labs & imaging results that were available during my care of the patient were reviewed by me and considered in my medical decision making (see chart for details).   Patient presents to the emergency department with 2 separate complaints.  She has atypical central chest pain since last night which is been constant.  Low suspicion for acute coronary syndrome.  PE much less likely.  Vitals are normal and pain pattern is atypical.  Plan for Maalox and reassess.  Chest x-ray and troponin are pending.  Patient has not baseline neuro deficits.  Her headache is typical of her migraine headaches in the past.  No neuro imaging at this time.  Plan for  headache management here and reassess.  04:20 PM  Patient is feeling much better after migraine treatment.  X-ray and lab work reviewed with no acute findings.  Plan for outpatient neurology follow-up.  Will provide prescription for Compazine to take as needed with any return of migraine headache symptoms.  No indication for stroke work-up.  Patient's reported dysarthria appears related to her denture use.  No focal deficits here.  Plan for discharge. Patient comfortable with the plan at discharge.  ____________________________________________  FINAL CLINICAL IMPRESSION(S) / ED DIAGNOSES  Final diagnoses:  Migraine without aura and without status migrainosus, not intractable  Precordial chest pain     MEDICATIONS GIVEN DURING THIS VISIT:  Medications  alum & mag hydroxide-simeth (MAALOX/MYLANTA) 200-200-20 MG/5ML suspension 15 mL (15 mLs Oral Given 11/14/18 1535)  ketorolac (TORADOL) 30 MG/ML injection 30 mg (30 mg Intravenous Given 11/14/18 1529)  metoCLOPramide (REGLAN) injection 10 mg (10 mg Intravenous Given 11/14/18 1530)  diphenhydrAMINE (BENADRYL) injection 25 mg (25 mg Intravenous Given 11/14/18 1530)     NEW OUTPATIENT MEDICATIONS STARTED DURING THIS VISIT:  New Prescriptions   METOCLOPRAMIDE (REGLAN) 10 MG TABLET    Take 1 tablet (10 mg total) by mouth every 8 (eight) hours as needed for nausea (and headache symptoms).    Note:  This document was prepared using Dragon voice recognition software and may include unintentional dictation errors.  Nanda Quinton, MD Emergency Medicine    Yanique Mulvihill, [facility], MD 11/14/18 [phone]

## 2018-11-14 NOTE — ED Triage Notes (Signed)
Pt brought in by EMS. Mother had taken pt to fire dept due to not feeling well, chest pain, HA and mother thought she had slurred speech. Pt reports that chest does not hurt and that she put too much denture cream and stuck to roof of her mouth. Pt reports she has hx of stroke in 2009 and has hx of left sided deficit . Stroke scale completed

## 2018-11-14 NOTE — ED Notes (Signed)
Ambulated pt to [facility] with assistance

## 2018-11-19 ENCOUNTER — Telehealth: Payer: Self-pay | Admitting: Neurology

## 2018-11-19 NOTE — Telephone Encounter (Signed)
Patient is scheduled for Monday 11/26/18 at 10:30 and she is going to mail out all the stuff she has today before the visit. Thanks!

## 2018-11-19 NOTE — Telephone Encounter (Signed)
OK for virtual visit. Please request that we get hospital records and MRI on disc so I can review prior to her appointment. OK for e-visit 4 weeks after hospital discharge.

## 2018-11-19 NOTE — Telephone Encounter (Signed)
Patient's mom is calling in about patient having stroke and is in the [facility]. She was wanting to schedule her for a visit. Would she need a VV or in office and is this urgent? Thanks!

## 2018-11-19 NOTE — Telephone Encounter (Signed)
Please schedule a virtual visit for this pt.  Remind the mom to bring disk and if she can bring records form [facility] that would be helpful. Thank you.

## 2018-11-19 NOTE — Telephone Encounter (Signed)
Dr. Posey Pronto,  Are you ok seeing this pt in the office?  Pt was discharged last Friday from [facility] had MRI while there. Speech, swallowing and mobility have been altered.  On soft diet with thickened liquids.  Mom has disk of MRI imaging.

## 2018-11-26 ENCOUNTER — Encounter: Payer: Self-pay | Admitting: Neurology

## 2018-11-26 ENCOUNTER — Other Ambulatory Visit: Payer: Self-pay

## 2018-11-26 ENCOUNTER — Other Ambulatory Visit: Payer: Self-pay | Admitting: Neurology

## 2018-11-26 ENCOUNTER — Telehealth (INDEPENDENT_AMBULATORY_CARE_PROVIDER_SITE_OTHER): Payer: Medicaid Other | Admitting: Neurology

## 2018-11-26 VITALS — Ht 63.0 in | Wt 231.0 lb

## 2018-11-26 DIAGNOSIS — F17200 Nicotine dependence, unspecified, uncomplicated: Secondary | ICD-10-CM

## 2018-11-26 DIAGNOSIS — I6521 Occlusion and stenosis of right carotid artery: Secondary | ICD-10-CM

## 2018-11-26 DIAGNOSIS — I639 Cerebral infarction, unspecified: Secondary | ICD-10-CM

## 2018-11-26 DIAGNOSIS — R635 Abnormal weight gain: Secondary | ICD-10-CM

## 2018-11-26 DIAGNOSIS — G890 Central pain syndrome: Secondary | ICD-10-CM

## 2018-11-26 DIAGNOSIS — G40909 Epilepsy, unspecified, not intractable, without status epilepticus: Secondary | ICD-10-CM

## 2018-11-26 DIAGNOSIS — I6350 Cerebral infarction due to unspecified occlusion or stenosis of unspecified cerebral artery: Secondary | ICD-10-CM | POA: Diagnosis not present

## 2018-11-26 MED ORDER — ATORVASTATIN CALCIUM 40 MG PO TABS: 40.0000 mg | ORAL_TABLET | Freq: Every day | ORAL | 5 refills | Status: DC

## 2018-11-26 MED ORDER — CLOPIDOGREL BISULFATE 75 MG PO TABS: 75.0000 mg | ORAL_TABLET | Freq: Every day | ORAL | 5 refills | Status: DC

## 2018-11-26 NOTE — Progress Notes (Signed)
Virtual Visit via Video Note The purpose of this virtual visit is to provide medical care while limiting exposure to the novel coronavirus.    Consent was obtained for video visit:  Yes.   Answered questions that patient had about telehealth interaction:  Yes.   I discussed the limitations, risks, security and privacy concerns of performing an evaluation and management service by telemedicine. I also discussed with the patient that there may be a patient responsible charge related to this service. The patient expressed understanding and agreed to proceed.  Pt location: Home Physician Location: office Name of referring provider:  Monico Blitz, MD I connected with Taylor [name] at patients initiation/request on 11/26/2018 at 10:30 AM EDT by video enabled telemedicine application and verified that I am speaking with the correct person using two identifiers. Pt MRN:  [id number] Pt DOB:  Dec 24, 1980 Video Participants:  Taylor [name]; mother   History of Present Illness: This is a 38 y.o. female returning for follow-up of recent pontine infarct.  She was admitted to [facility] on 6/3 - 6/4 for acute onset of slurred speech and left facial droop.  NIHSS 6 (left arm and leg weakness, moderate dysarthria, sensory changes).  MRI brain was personally viewed and shows acute left paramedian pontine infract and old encephalomalacia from right PCA infarct and bithalamic infarct.  CTA shows narrowing at the right proximal ICA and cavernous ICA segments bilaterally.  She was started on plavix 75 mg daily and continued on aspirin 81mg  daily.    She continues to have difficulty with speech and swallow.  She is using the thickener for liquids.  Her left facial droop and left-sided weakness has significantly improved.  She is able to walk unassisted. Home PT,  OT, and speech therapy has been arranged.  She was told she needed a modified barium swallow, but still has not had this performed.  She also complains of  worsening pain on the left side.   Observations/Objective:   Vitals:   11/26/18 0816  Weight: 231 lb (104.8 kg)  Height: 5\' 3"  (1.6 m)   Patient is awake, alert, and appears comfortable.  Oriented x 4.   Extraocular muscles are intact. No ptosis.  Face is symmetric, no droop appreciated.  Speech is moderately dysarthric. Tongue is midline. Tongue movements are slowed Antigravity in all extremities.  No pronator drift. Gait is unassisted, overall stable.  She continues to have mild right hip flexion and foot plantarflexion when raising the left leg, but this is less pronounced than before.   DATA: MRI head without contrast 11/16/2018: Acute brainstem infarct in the left central pons.  No associated hemorrhage or mass-effect. Underlying very advanced age chronic ischemic disease including previous right PCA and bilateral deep gray matter nuclei infarcts.  Assessment and Plan:  1.  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   She does have intracranial stenosis which is most likely etiology.  CTA shows right ICA stenosis approximately, however, this would not cause left pontine stroke.  Family says she had left facial droop and weakness, this would not clinically make sense with left pontine infarct, so it's possible left sided symptoms are recrudescence from prior R PCA stroke.  Currently, she is most symptomatic from dysarthria and dysphasia.  Based on the records that I received from [facility], stroke work-up was limited to imaging findings only.  She will need additional work-up as noted below:  - Echocardiogram with bubble  - US carotids to  better quantify right ICA stenosis  - Modified barium swallow.  Continue to use thickner for fluids and soft foods  - Cardiac event monitor to monitor for afib, especially given her prior history of stroke  - Continue aspirin 81mg  + plavix 75mg  for 3 months, then plavix 75mg  alone  - Start lipitor 40mg  daily  - Request  lipid panel, CTA head and neck report  - Continue home PT/OT/speech therapy  - She was praised for quitting smoking 3 weeks ago and encouraged to continue to avoid  2.  History of right PCA and bilateral thalamic stroke, 2013.  Stroke mechanism thought to be due to P2 stenosis and hypercoagulable state due to sepsis as this occurred in the setting of prolonged hospitalization in 2013 with benzodiazepine overdose, GI bleed, DKA, and sepsis.  With her more recent stroke, need to evaluate for cardiac arrhythmia and PFO as a potential source.    Follow Up Instructions:   I discussed the assessment and treatment plan with the patient. The patient was provided an opportunity to ask questions and all were answered. The patient agreed with the plan and demonstrated an understanding of the instructions.   The patient was advised to call back or seek an in-person evaluation if the symptoms worsen or if the condition fails to improve as anticipated.  Further recommendations pending results.   Total time spent:  40 minutes     Alda Berthold, DO

## 2018-11-26 NOTE — Telephone Encounter (Signed)
I did not see this in the note from [place] today. Is Gabapentin ok to refill?

## 2018-11-30 ENCOUNTER — Telehealth: Payer: Self-pay | Admitting: Neurology

## 2018-11-30 NOTE — Telephone Encounter (Signed)
[facility] records received for hospitalization 6/3 -11/16/2018  Patient presented to ER on 11/14/2018 with slurred speech that started in the morning in addition to worsening left-sided weakness from her baseline.  She had a migraine earlier that day and was seen at [facility], and it was thought that her symptoms were due to complex migraine.  As the day progressed, she continued to have slurred speech and went to [facility] ER.  Blood pressure on arrival was 144/84, heart rate 91, she was afebrile.  Blood glucose was elevated at 319*.  CT head without contrast 11/14/2018: No acute intercranial abnormality.  Old right PCA territory infarct encephalomalacia.  Aspects is 10  CTA head and neck 11/15/2018: No emergent large vessel occlusion.  Approximately 75% proximal left ICA stenosis secondary to predominantly calcified atherosclerosis.  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  MRI head 11/16/2018: Acute brainstem infarct in the left central pons.  No associated hemorrhage or mass-effect. Underlying very advanced age chronic ischemic disease including previous right PCA and bilateral deep gray matter nuclei infarcts.  She needs an updated fasting lipid panel, as this was not ordered in the hospital. Echocardiogram, ultrasound carotids, and cardiac event monitor has been ordered. Further recommendations pending the above results.

## 2018-12-03 ENCOUNTER — Ambulatory Visit (HOSPITAL_COMMUNITY)
Admission: RE | Admit: 2018-12-03 | Discharge: 2018-12-03 | Disposition: A | Discharge status: Home / Self Care | Payer: Medicaid Other | Source: Ambulatory Visit | Attending: Neurology | Admitting: Neurology

## 2018-12-03 ENCOUNTER — Other Ambulatory Visit: Payer: Self-pay

## 2018-12-03 DIAGNOSIS — G40909 Epilepsy, unspecified, not intractable, without status epilepticus: Secondary | ICD-10-CM

## 2018-12-03 DIAGNOSIS — R635 Abnormal weight gain: Secondary | ICD-10-CM

## 2018-12-03 DIAGNOSIS — I6350 Cerebral infarction due to unspecified occlusion or stenosis of unspecified cerebral artery: Secondary | ICD-10-CM

## 2018-12-03 DIAGNOSIS — I639 Cerebral infarction, unspecified: Secondary | ICD-10-CM

## 2018-12-03 DIAGNOSIS — I6521 Occlusion and stenosis of right carotid artery: Secondary | ICD-10-CM

## 2018-12-03 DIAGNOSIS — F17200 Nicotine dependence, unspecified, uncomplicated: Secondary | ICD-10-CM

## 2018-12-03 DIAGNOSIS — R252 Cramp and spasm: Secondary | ICD-10-CM

## 2018-12-03 DIAGNOSIS — G890 Central pain syndrome: Secondary | ICD-10-CM

## 2018-12-03 NOTE — Progress Notes (Signed)
*  PRELIMINARY RESULTS* Echocardiogram 2D Echocardiogram has been performed with saline bubble study.  Samuel Germany 12/03/2018, 9:39 AM

## 2018-12-04 ENCOUNTER — Telehealth: Payer: Self-pay | Admitting: *Deleted

## 2018-12-04 NOTE — Telephone Encounter (Signed)
30 day event monitor enrollment completed with [employer]. Enrolled under [place] but result should go to ordering provider-Donika Posey Pronto DO at [facility]. Monitor will be mailed to home address. Mother of patient informed and verbalized understanding.

## 2018-12-12 ENCOUNTER — Ambulatory Visit (INDEPENDENT_AMBULATORY_CARE_PROVIDER_SITE_OTHER): Payer: Medicaid Other

## 2018-12-12 DIAGNOSIS — G40909 Epilepsy, unspecified, not intractable, without status epilepticus: Secondary | ICD-10-CM | POA: Diagnosis not present

## 2018-12-12 DIAGNOSIS — I6521 Occlusion and stenosis of right carotid artery: Secondary | ICD-10-CM

## 2018-12-12 DIAGNOSIS — I352 Nonrheumatic aortic (valve) stenosis with insufficiency: Secondary | ICD-10-CM

## 2018-12-12 DIAGNOSIS — I639 Cerebral infarction, unspecified: Secondary | ICD-10-CM

## 2018-12-12 DIAGNOSIS — I6350 Cerebral infarction due to unspecified occlusion or stenosis of unspecified cerebral artery: Secondary | ICD-10-CM

## 2018-12-13 ENCOUNTER — Other Ambulatory Visit: Payer: Self-pay

## 2019-01-08 ENCOUNTER — Telehealth: Payer: Self-pay | Admitting: Neurology

## 2019-01-08 ENCOUNTER — Encounter: Payer: Self-pay | Admitting: Neurology

## 2019-01-08 NOTE — Telephone Encounter (Signed)
Contacted advised ER for evaluation and treatment

## 2019-01-08 NOTE — Telephone Encounter (Signed)
Mother was calling in wanting to speak with the nurse about patient. Thanks!

## 2019-01-16 ENCOUNTER — Encounter: Payer: Self-pay | Admitting: Neurology

## 2019-01-17 NOTE — Progress Notes (Addendum)
Virtual Visit via Video Note The purpose of this virtual visit is to provide medical care while limiting exposure to the novel coronavirus.    Consent was obtained for video visit:  Yes.   Answered questions that patient had about telehealth interaction:  Yes.   I discussed the limitations, risks, security and privacy concerns of performing an evaluation and management service by telemedicine. I also discussed with the patient that there may be a patient responsible charge related to this service. The patient expressed understanding and agreed to proceed.  Pt location: Home Physician Location: office Name of referring provider:  Monico Blitz, MD I connected with Taylor [name] at patients initiation/request on 01/18/2019 at  2:30 PM EDT by video enabled telemedicine application and verified that I am speaking with the correct person using two identifiers. Pt MRN:  [id number] Pt DOB:  05/04/1981 Video Participants:  Taylor [name]   History of Present Illness: This is a 38 y.o. female returning for hospital follow-up for stroke.  She presented to [facility] ER on 01/08/2019 with dysarthria and overall malaise.  MRI showed 9 mm acute ischemic nonhemorrhagic left thalamic lacunar infarct.  Chronic right PCA territory infarct was seen.  Vessel imaging shows chronically occluded right PCA and severe stenosis at the anterior genu of the cavernous left ICA.  Extracranial imaging shows left ICA 50% stenosis at the origin.  Right carotid system is patent.  She was already on aspirin 81 mg and Plavix 75 mg at home.  Her medication was adjusted to increase aspirin to 325mg , and continue Plavix 75 mg daily.  Blood pressure was normal on arrival to the ER.   She continues to have mild dysarthria and is complaining of right arm and leg pain, which is new. Similar presentation in June 2028 with acute onset of slurred speech and left facial droop, while on aspirin 81mg , where MRI showed acute left  paramedian pontine infarct.  She was started on Plavix and continued on aspirin.  She had echocardiogram with bubble and 30-day cardiac event monitor which was normal.  Ultrasound carotids was performed yesterday at [facility], which is pending.  She quit smoking a month ago and has been doing well to curb this craving.  Observations/Objective:   Vitals:   01/16/19 1046  Weight: 219 lb (99.3 kg)  Height: 5\' 3"  (1.6 m)   Patient is awake, alert, and appears comfortable.  Oriented x 4.   Extraocular muscles are intact. No ptosis.  Mild right facial droop.  Speech is mildly dysarthric. Tongue is midline. Antigravity in all extremities.  No pronator drift. Gait appears slow and assisted with a walker.  Assessment and Plan:  1.  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  Most likely etiology is intracranial stenosis which is seen on her imaging.  She has severe distal left ICA stenosis and it is possible she is symptomatic from this lesion.  Given that she has had events even on adequate medical therapy, I will refer her for cerebral angiogram to better assess her vessel status. Echocardiogram with bubble and cardiac event monitor was normal.   Continue aspirin 325mg  + plavix 75mg  + lipitor 40mg  daily Blood pressure is well controlled on medication SLP swallow evaluation Continue out-patient speech therapy, PT/OT  2.  History of right PCA and bilateral thalamic stroke, 2013.  Stroke mechanism thought to be due to P2  stenosis and hypercoagulable state due to sepsis as this occurred in the setting of prolonged hospitalization in 2013 with benzodiazepine overdose, GI bleed, DKA, and sepsis   Follow Up Instructions:   I discussed the assessment and treatment plan with the patient. The patient was provided an opportunity to ask questions and all were  answered. The patient agreed with the plan and demonstrated an understanding of the instructions.   The patient was advised to call back or seek an in-person evaluation if the symptoms worsen or if the condition fails to improve as anticipated.  Total time spent:  30 minutes     Taylor Berthold, DO   ADDENDUM  Images requested on CD and MRI/A head and neck personally viewed which shows severe distal left ICA stenosis from which she is most likely symptomatic.  Plan to proceed for cerebral angiogram for evaluation and management  Increase lipitor to 80mg  daily, continue dual antiplatelet therapy as noted above.   Taylor Carrera K. Posey Pronto, DO

## 2019-01-18 ENCOUNTER — Other Ambulatory Visit: Payer: Self-pay

## 2019-01-18 ENCOUNTER — Telehealth (INDEPENDENT_AMBULATORY_CARE_PROVIDER_SITE_OTHER): Payer: Medicaid Other | Admitting: Neurology

## 2019-01-18 VITALS — Ht 63.0 in | Wt 219.0 lb

## 2019-01-18 DIAGNOSIS — R471 Dysarthria and anarthria: Secondary | ICD-10-CM

## 2019-01-18 DIAGNOSIS — I639 Cerebral infarction, unspecified: Secondary | ICD-10-CM

## 2019-01-18 DIAGNOSIS — G890 Central pain syndrome: Secondary | ICD-10-CM

## 2019-01-18 DIAGNOSIS — I6522 Occlusion and stenosis of left carotid artery: Secondary | ICD-10-CM

## 2019-01-18 DIAGNOSIS — R1310 Dysphagia, unspecified: Secondary | ICD-10-CM | POA: Diagnosis not present

## 2019-01-23 ENCOUNTER — Telehealth: Payer: Self-pay | Admitting: Neurology

## 2019-01-23 DIAGNOSIS — I6522 Occlusion and stenosis of left carotid artery: Secondary | ICD-10-CM | POA: Insufficient documentation

## 2019-01-23 MED ORDER — ATORVASTATIN CALCIUM 40 MG PO TABS: 40.0000 mg | ORAL_TABLET | Freq: Every day | ORAL | 5 refills | Status: DC

## 2019-01-23 NOTE — Progress Notes (Signed)
Orders placed.

## 2019-01-23 NOTE — Telephone Encounter (Signed)
Mom said that the lipitor medication was not sent to the [facility]. She was wanting that prescription sent in. Thanks!

## 2019-01-23 NOTE — Addendum Note (Signed)
Addended by: Ranae Plumber on: 01/23/2019 11:43 AM   Modules accepted: Orders

## 2019-01-23 NOTE — Telephone Encounter (Signed)
Requested Prescriptions   Pending Prescriptions Disp Refills  . atorvastatin (LIPITOR) 40 MG tablet 30 tablet 5    Sig: Take 1 tablet (40 mg total) by mouth daily.   rx resent

## 2019-01-24 ENCOUNTER — Other Ambulatory Visit (HOSPITAL_COMMUNITY)
Admission: RE | Admit: 2019-01-24 | Discharge: 2019-01-24 | Disposition: A | Discharge status: Home / Self Care | Payer: Medicaid Other | Source: Ambulatory Visit | Attending: Neurology | Admitting: Neurology

## 2019-01-24 ENCOUNTER — Other Ambulatory Visit: Payer: Self-pay

## 2019-01-24 DIAGNOSIS — I6350 Cerebral infarction due to unspecified occlusion or stenosis of unspecified cerebral artery: Secondary | ICD-10-CM | POA: Insufficient documentation

## 2019-01-24 DIAGNOSIS — R635 Abnormal weight gain: Secondary | ICD-10-CM | POA: Diagnosis present

## 2019-01-24 DIAGNOSIS — G890 Central pain syndrome: Secondary | ICD-10-CM | POA: Insufficient documentation

## 2019-01-24 DIAGNOSIS — G40909 Epilepsy, unspecified, not intractable, without status epilepticus: Secondary | ICD-10-CM | POA: Diagnosis present

## 2019-01-24 DIAGNOSIS — R252 Cramp and spasm: Secondary | ICD-10-CM | POA: Diagnosis present

## 2019-01-24 DIAGNOSIS — I639 Cerebral infarction, unspecified: Secondary | ICD-10-CM

## 2019-01-24 DIAGNOSIS — I6521 Occlusion and stenosis of right carotid artery: Secondary | ICD-10-CM

## 2019-01-24 LAB — LIPID PANEL
Cholesterol: 119 mg/dL (ref 0–200)
HDL: 30 mg/dL — ABNORMAL LOW (ref >40)
LDL Cholesterol: 35 mg/dL (ref 0–99)
Total CHOL/HDL Ratio: 4 RATIO
Triglycerides: 270 mg/dL — ABNORMAL HIGH (ref <150)
VLDL: 54 mg/dL — ABNORMAL HIGH (ref 0–40)

## 2019-01-24 MED ORDER — ATORVASTATIN CALCIUM 80 MG PO TABS: 80.0000 mg | ORAL_TABLET | Freq: Every day | ORAL | 5 refills | Status: DC

## 2019-01-24 NOTE — Telephone Encounter (Signed)
Noted! Thank you

## 2019-01-24 NOTE — Telephone Encounter (Signed)
She can take (2) tablets of lipitor 40mg   = 80mg  to use to them up.  I have sent a new Rx for lipitor 80mg  to take once she has finished her current tablets.   Thanks. DP

## 2019-01-24 NOTE — Telephone Encounter (Signed)
Pt mother called back at pharmacy now. She states that Lipitor was increase to 80 mg. I do not see this?  Ok to send in 80 mg?

## 2019-01-24 NOTE — Addendum Note (Signed)
Addended by: Alda Berthold on: 01/24/2019 09:58 AM   Modules accepted: Orders

## 2019-01-29 ENCOUNTER — Other Ambulatory Visit: Payer: Self-pay | Admitting: *Deleted

## 2019-01-29 DIAGNOSIS — I6350 Cerebral infarction due to unspecified occlusion or stenosis of unspecified cerebral artery: Secondary | ICD-10-CM

## 2019-01-29 DIAGNOSIS — I6521 Occlusion and stenosis of right carotid artery: Secondary | ICD-10-CM

## 2019-01-29 DIAGNOSIS — I639 Cerebral infarction, unspecified: Secondary | ICD-10-CM

## 2019-01-29 DIAGNOSIS — R1310 Dysphagia, unspecified: Secondary | ICD-10-CM

## 2019-01-29 DIAGNOSIS — I6522 Occlusion and stenosis of left carotid artery: Secondary | ICD-10-CM

## 2019-01-29 NOTE — Progress Notes (Addendum)
Orders entered for SLP - modified barium swallow. Spoke to [place] at [facility] outpatient rehab [phone]) and she needed the order re-entered in [place].    Referral re-entered for [facility] IR with Dr. Estanislado Pandy. Spoke to [place] in [employer] and she has received referral. DVD/CD from [facility] (dated November 16 2018 and January 11 2019) will be given to [facility] in IR today so MD may review prior to appt.   Mom Lattie Haw called and left message regarding IR appt - call returned and no answer. IR office will call and schedule with patient.   Update - 2 DVDs taken to Radiology dept at [facility] attn: Anderson Malta.

## 2019-01-29 NOTE — Addendum Note (Signed)
Addended by: Jesse Fall on: 01/29/2019 03:12 PM   Modules accepted: Orders

## 2019-01-30 ENCOUNTER — Other Ambulatory Visit (HOSPITAL_COMMUNITY): Payer: Self-pay | Admitting: Specialist

## 2019-01-30 ENCOUNTER — Telehealth (HOSPITAL_COMMUNITY): Payer: Self-pay | Admitting: Internal Medicine

## 2019-01-30 ENCOUNTER — Other Ambulatory Visit (HOSPITAL_COMMUNITY): Payer: Self-pay | Admitting: Interventional Radiology

## 2019-01-30 DIAGNOSIS — I6320 Cerebral infarction due to unspecified occlusion or stenosis of unspecified precerebral arteries: Secondary | ICD-10-CM

## 2019-01-30 DIAGNOSIS — R1319 Other dysphagia: Secondary | ICD-10-CM

## 2019-01-30 NOTE — Telephone Encounter (Signed)
01/30/19  Called and spoke with mom and let her know the date and time for MBSS

## 2019-02-04 ENCOUNTER — Telehealth: Payer: Self-pay | Admitting: Physician Assistant

## 2019-02-04 ENCOUNTER — Other Ambulatory Visit: Payer: Self-pay | Admitting: Physician Assistant

## 2019-02-04 NOTE — Telephone Encounter (Signed)
Patient with anaphylactic contrast allergy per chart planned for cerebral angiogram 02/05/19 at 11:30 am.  Contrast allergy pre-medications of 50 mg prednisone PO 13 hours, 7 hours and 1 hour prior to procedure + benadryl 50 mg PO 1 hour prior to procedure were called in to [employer], [place] ([phone]) at 1646 today by this Probation officer.  Candiss Norse, PA-C

## 2019-02-05 ENCOUNTER — Other Ambulatory Visit: Payer: Self-pay

## 2019-02-05 ENCOUNTER — Encounter (HOSPITAL_COMMUNITY): Payer: Self-pay

## 2019-02-05 ENCOUNTER — Ambulatory Visit (HOSPITAL_COMMUNITY)
Admission: RE | Admit: 2019-02-05 | Discharge: 2019-02-05 | Disposition: A | Discharge status: Home / Self Care | Payer: Medicaid Other | Source: Ambulatory Visit | Attending: Interventional Radiology | Admitting: Interventional Radiology

## 2019-02-05 ENCOUNTER — Telehealth: Payer: Self-pay | Admitting: Student

## 2019-02-05 DIAGNOSIS — I10 Essential (primary) hypertension: Secondary | ICD-10-CM | POA: Insufficient documentation

## 2019-02-05 DIAGNOSIS — E119 Type 2 diabetes mellitus without complications: Secondary | ICD-10-CM | POA: Diagnosis not present

## 2019-02-05 DIAGNOSIS — Z539 Procedure and treatment not carried out, unspecified reason: Secondary | ICD-10-CM | POA: Diagnosis not present

## 2019-02-05 DIAGNOSIS — F319 Bipolar disorder, unspecified: Secondary | ICD-10-CM | POA: Diagnosis not present

## 2019-02-05 DIAGNOSIS — F419 Anxiety disorder, unspecified: Secondary | ICD-10-CM | POA: Insufficient documentation

## 2019-02-05 DIAGNOSIS — Z7902 Long term (current) use of antithrombotics/antiplatelets: Secondary | ICD-10-CM | POA: Insufficient documentation

## 2019-02-05 DIAGNOSIS — I6320 Cerebral infarction due to unspecified occlusion or stenosis of unspecified precerebral arteries: Secondary | ICD-10-CM | POA: Diagnosis not present

## 2019-02-05 DIAGNOSIS — Z79899 Other long term (current) drug therapy: Secondary | ICD-10-CM | POA: Diagnosis not present

## 2019-02-05 DIAGNOSIS — Z7982 Long term (current) use of aspirin: Secondary | ICD-10-CM | POA: Diagnosis not present

## 2019-02-05 DIAGNOSIS — F17210 Nicotine dependence, cigarettes, uncomplicated: Secondary | ICD-10-CM | POA: Insufficient documentation

## 2019-02-05 DIAGNOSIS — Z7984 Long term (current) use of oral hypoglycemic drugs: Secondary | ICD-10-CM | POA: Insufficient documentation

## 2019-02-05 DIAGNOSIS — F329 Major depressive disorder, single episode, unspecified: Secondary | ICD-10-CM | POA: Diagnosis not present

## 2019-02-05 LAB — CBC
HCT: 38.5 % (ref 36.0–46.0)
Hemoglobin: 12.1 g/dL (ref 12.0–15.0)
MCH: 26.8 pg (ref 26.0–34.0)
MCHC: 31.4 g/dL (ref 30.0–36.0)
MCV: 85.4 fL (ref 80.0–100.0)
Platelets: 212 10*3/uL (ref 150–400)
RBC: 4.51 MIL/uL (ref 3.87–5.11)
RDW: 14.3 % (ref 11.5–15.5)
WBC: 13.3 10*3/uL — ABNORMAL HIGH (ref 4.0–10.5)
nRBC: 0 % (ref 0.0–0.2)

## 2019-02-05 LAB — BASIC METABOLIC PANEL
Anion gap: 15 (ref 5–15)
BUN: 19 mg/dL (ref 6–20)
CO2: 20 mmol/L — ABNORMAL LOW (ref 22–32)
Calcium: 9.4 mg/dL (ref 8.9–10.3)
Chloride: 103 mmol/L (ref 98–111)
Creatinine, Ser: 0.94 mg/dL (ref 0.44–1.00)
GFR calc Af Amer: >60 mL/min (ref >60)
GFR calc non Af Amer: >60 mL/min (ref >60)
Glucose, Bld: 238 mg/dL — ABNORMAL HIGH (ref 70–99)
Potassium: 4.1 mmol/L (ref 3.5–5.1)
Sodium: 138 mmol/L (ref 135–145)

## 2019-02-05 LAB — PROTIME-INR
INR: 1.1 (ref 0.8–1.2)
Prothrombin Time: 13.7 seconds (ref 11.4–15.2)

## 2019-02-05 LAB — GLUCOSE, CAPILLARY: Glucose-Capillary: 226 mg/dL — ABNORMAL HIGH (ref 70–99)

## 2019-02-05 MED ORDER — LORAZEPAM 0.5 MG PO TABS
0.5000 mg | ORAL_TABLET | Freq: Once | ORAL | Status: DC
  Filled 2019-02-05: qty 1

## 2019-02-05 MED ORDER — SODIUM CHLORIDE 0.9 % IV SOLN: INTRAVENOUS | Status: DC

## 2019-02-05 NOTE — Progress Notes (Signed)
Pt case was cancelled per radiology due to emergent case.  Pt. Understands situation.  Instructions given per PA for rescheduled case on Thursday.  IV discontinued with site intact and pt discharged via wheelchair.

## 2019-02-05 NOTE — Progress Notes (Signed)
NIR.  Patient was scheduled for an image-guided diagnostic cerebral arteriogram today with Dr. Estanislado Pandy at the request of Dr. Posey Pronto.  Unfortunately, there was an emergent Code Stroke procedure that required Dr. Arlean Hopping immediate attention at this time. Because of this, patient's procedure will not occur today and will be rescheduled. Informed patient and mother of above. Procedure to be rescheduled for Thursday 02/07/2019 at 1130 (arrive at 0930). Handout given to patient's mother with pre-procedure information. Informed patient/mother that patient to take her pre-medication regimen the same way she did for todays procedure. All questions answered and concerns addressed.  [employer] [phone]) at 1301 to fill pre-medication regimen for contrast allergy (anaphylactic reaction per chart review): 1- Prednisone 50 mg tablets; take one tablet by mouth 13 hours, 7 hours, and 1 hour prior to procedure 02/07/2019; dispense 3 tablets with 0 refills. 2- Benadryl 50 mg tablets; take one tablet by mouth 1 hour prior to procedure 02/07/2019; dispense 1 tablet with 0 refills.   Bea Graff Louk, PA-C 02/05/2019, 1:07 PM

## 2019-02-05 NOTE — H&P (Signed)
Chief Complaint: Patient was seen in consultation today for cerebral arteriogram at the request of Dr Narda Amber   Supervising Physician: Luanne Bras  Patient Status: [facility] - Out-pt  History of Present Illness: Taylor [name] is a 38 y.o. female   Dr Narda Amber note 01/18/19: History of Present Illness:  [place] ER on 01/08/2019 with dysarthria and overall malaise.  MRI showed 9 mm acute ischemic nonhemorrhagic left thalamic lacunar infarct.  Chronic right PCA territory infarct was seen.  Vessel imaging shows chronically occluded right PCA and severe stenosis at the anterior genu of the cavernous left ICA.  Extracranial imaging shows left ICA 50% stenosis at the origin.  Right carotid system is patent. She was already on aspirin 81 mg and Plavix 75 mg at home.  Her medication was adjusted to increase aspirin to 325mg , and continue Plavix 75 mg daily.  Blood pressure was normal on arrival to the ER.  She continues to have mild dysarthria and is complaining of right arm and leg pain, which is new. Similar presentation in June 2028 with acute onset of slurred speech and left facial droop, while on aspirin 81mg , where MRI showed acute left paramedian pontine infarct.  She was started on Plavix and continued on aspirin. She had echocardiogram with bubble and 30-day cardiac event monitor which was normal.  Ultrasound carotids was performed yesterday at [facility], which is pending.  She quit smoking a month ago and has been doing well to curb this craving. Assessment and Plan:  1.  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  Most likely etiology is intracranial stenosis which is seen on her imaging.  She has severe distal left ICA stenosis and it is possible she is symptomatic from this lesion.  Given that she has had events even on  adequate medical therapy, I will refer her for cerebral angiogram to better assess her vessel status. Echocardiogram with bubble and cardiac event monitor was normal.   Continue aspirin 325mg  + plavix 75mg  + lipitor 40mg  daily Blood pressure is well controlled on medication SLP swallow evaluation Continue out-patient speech therapy, PT/OT 2.History of right PCA and bilateral thalamic stroke, 2013. Stroke mechanism thought to be due to P2 stenosis and hypercoagulable state due to sepsis as this occurred in the setting of prolonged hospitalization in 2013 with benzodiazepine overdose, GI bleed, DKA, and sepsis  Scheduled now for cerebral arteriogram with evaluation- possible treatment plan with Dr Estanislado Pandy    Past Medical History:  Diagnosis Date  . Anxiety   . Bipolar disorder ([place])   . Cerebral artery occlusion with cerebral infarction ([place]) 11/13   due to elevated blood sugar 1700-off insulin 6 months  . Depression   . Diabetes mellitus    off insulin 6 months  . Headache(784.0)   . Hypertension   . Kidney dialysis status    on dialysis(stroke) off after 4-6 weeks  . Respiratory failure ([place]) 11/13   with stroke - in hosp ncbh 12 weeks  . Seizures ([place])    during elvated blood sugsr episode 11/13  . Sepsis ([place])   . Stroke ([place])   . Vocal cord dysfunction     Past Surgical History:  Procedure Laterality Date  . APPENDECTOMY    . INSERTION OF DIALYSIS CATHETER  11/13   removed in 4-6 weeks  . MULTIPLE EXTRACTIONS WITH ALVEOLOPLASTY N/A 02/24/2014  Procedure: MULTIPLE EXTRACTION WITH ALVEOLOPLASTY AND BIOPSY;  Surgeon: Gae Bon, DDS;  Location: [facility];  Service: Oral Surgery;  Laterality: N/A;  . TRACHEOSTOMY  11/13   closed 1/14  . TRANSRECTAL DRAINAGE OF PELVIC ABSCESS  12/11    Allergies: Contrast media [iodinated diagnostic agents], Amitriptyline, Compazine, Oxycodone, Prochlorperazine, Promethazine, and Sumatriptan  Medications: Prior to Admission  medications   Medication Sig Start Date End Date Taking? Authorizing Provider  acetaminophen (TYLENOL) 325 MG tablet Take 650 mg by mouth every 6 (six) hours as needed. pain   Yes [provider]  aspirin EC 325 MG tablet Take 325 mg by mouth daily.    Yes [provider]  atorvastatin (LIPITOR) 40 MG tablet Take 40 mg by mouth daily.   Yes [provider]  clopidogrel (PLAVIX) 75 MG tablet Take 1 tablet (75 mg total) by mouth daily. 11/26/18  Yes Patel, Donika K, DO  dapagliflozin propanediol (FARXIGA) 10 MG TABS tablet Take 10 mg by mouth every morning.    Yes [provider]  diazepam (VALIUM) 5 MG tablet Take 5 mg by mouth 2 (two) times daily.    Yes [provider]  DULoxetine (CYMBALTA) 30 MG capsule Take 30 mg by mouth daily.   Yes [provider]  gabapentin (NEURONTIN) 300 MG capsule Take 3 tablets in the morning, 3 tab in the afternoon, and 4 tablets at bedtime. Patient taking differently: Take 900-1,200 mg by mouth See admin instructions. Take 3 tablets in the morning, 3 tab in the afternoon, and 4 tablets at bedtime. 11/26/18  Yes Patel, Donika K, DO  ibuprofen (ADVIL) 600 MG tablet Take 600 mg by mouth 2 (two) times daily.  11/16/18  Yes [provider]  labetalol (NORMODYNE) 300 MG tablet Take 300 mg by mouth 2 (two) times daily.   Yes [provider]  levETIRAcetam (KEPPRA XR) 500 MG 24 hr tablet Take 1,000 mg by mouth daily.   Yes [provider]  lisinopril (ZESTRIL) 40 MG tablet Take 40 mg by mouth daily.    Yes [provider]  metFORMIN (GLUCOPHAGE) 500 MG tablet Take 500 mg by mouth 2 (two) times daily. 11/16/18  Yes [provider]  omeprazole (PRILOSEC) 20 MG capsule Take 20 mg by mouth daily.   Yes [provider]  risperiDONE (RISPERDAL) 0.5 MG tablet Take 0.5 mg by mouth at bedtime.   Yes [provider]  traZODone (DESYREL) 100 MG tablet Take 200 mg by mouth at  bedtime.    Yes [provider]  atorvastatin (LIPITOR) 80 MG tablet Take 1 tablet (80 mg total) by mouth daily. Patient not taking: Reported on 02/04/2019 01/24/19   Alda Berthold, DO     Family History  Problem Relation Age of Onset  . Lung cancer Maternal Grandmother   . Heart attack Maternal Grandfather   . Diabetes Paternal Grandfather     Social History   Socioeconomic History  . Marital status: Single    Spouse name: Not on file  . Number of children: 0  . Years of education: 69  . Highest education level: Not on file  Occupational History  . Not on file  Social Needs  . Financial resource strain: Not on file  . Food insecurity    Worry: Not on file    Inability: Not on file  . Transportation needs    Medical: Not on file    Non-medical: Not on file  Tobacco Use  .  Smoking status: Current Every Day Smoker    Packs/day: 0.60    Years: 18.00    Pack years: 10.80    Types: Cigarettes  . Smokeless tobacco: Never Used  Substance and Sexual Activity  . Alcohol use: No    Alcohol/week: 0.0 standard drinks  . Drug use: No  . Sexual activity: Not Currently    Birth control/protection: None  Lifestyle  . Physical activity    Days per week: Not on file    Minutes per session: Not on file  . Stress: Not on file  Relationships  . Social Herbalist on phone: Not on file    Gets together: Not on file    Attends religious service: Not on file    Active member of club or organization: Not on file    Attends meetings of clubs or organizations: Not on file    Relationship status: Not on file  Other Topics Concern  . Not on file  Social History Narrative   She is single and does not have any children.    She has 2 yrs of college level education.   She has 5-6 caffeine drinks daily.    Left handed      Living with parents.    Review of Systems: A 12 point ROS discussed and pertinent positives are indicated in the HPI above.  All other systems  are negative.  Review of Systems  Constitutional: Positive for activity change. Negative for fatigue and fever.  HENT: Negative for tinnitus and trouble swallowing.   Eyes: Negative for visual disturbance.  Respiratory: Negative for cough and shortness of breath.   Cardiovascular: Negative for chest pain.  Gastrointestinal: Negative for abdominal pain.  Neurological: Positive for speech difficulty and numbness. Negative for dizziness, tremors, seizures, syncope, facial asymmetry, weakness, light-headedness and headaches.  Psychiatric/Behavioral: Positive for decreased concentration. Negative for behavioral problems.    Vital Signs: BP 138/83   Pulse 89   Temp 98.3 F (36.8 C) (Skin)   Resp 18   Ht 5\' 5"  (1.651 m)   Wt 219 lb (99.3 kg)   LMP 01/29/2019 (Exact Date)   SpO2 99%   BMI 36.44 kg/m   Physical Exam Vitals signs reviewed.  HENT:     Head: Atraumatic.  Eyes:     Extraocular Movements: Extraocular movements intact.  Neck:     Musculoskeletal: Normal range of motion.  Cardiovascular:     Rate and Rhythm: Normal rate and regular rhythm.     Heart sounds: Normal heart sounds.  Pulmonary:     Effort: Pulmonary effort is normal.     Breath sounds: Normal breath sounds.  Abdominal:     Palpations: Abdomen is soft.  Musculoskeletal: Normal range of motion.  Skin:    General: Skin is warm and dry.  Neurological:     Mental Status: She is alert and oriented to person, place, and time.  Psychiatric:     Comments: Pt has word finding deficit Speech is minimally slurred Pleasant  Consented with Mother at bedside     Imaging: No results found.  Labs:  CBC: Recent Labs    11/14/18 1521  WBC 8.3  HGB 11.7*  HCT 37.2  PLT 183    COAGS: No results for input(s): INR, APTT in the last 8760 hours.  BMP: Recent Labs    11/14/18 1521  NA 135  K 4.0  CL 101  CO2 23  GLUCOSE 235*  BUN 14  CALCIUM 9.0  CREATININE 0.72  GFRNONAA >60  GFRAA >60     LIVER FUNCTION TESTS: No results for input(s): BILITOT, AST, ALT, ALKPHOS, PROT, ALBUMIN in the last 8760 hours.  TUMOR MARKERS: No results for input(s): AFPTM, CEA, CA199, CHROMGRNA in the last 8760 hours.  Assessment and Plan:  CVA x 14 December 2018; June 2020 and 2013 L ICA stenosis on imaging per chart Scheduled today for cerebral arteriogram - further evaluate and manage possible treatment with Dr Estanislado Pandy Risks and benefits of cerebral angiogram with intervention were discussed with the patient including, but not limited to bleeding, infection, vascular injury, contrast induced renal failure, stroke or even death.  This interventional procedure involves the use of X-rays and because of the nature of the planned procedure, it is possible that we will have prolonged use of X-ray fluoroscopy.  Potential radiation risks to you include (but are not limited to) the following: - A slightly elevated risk for cancer  several years later in life. This risk is typically less than 0.5% percent. This risk is low in comparison to the normal incidence of human cancer, which is 33% for women and 50% for men according to the [employer]. - Radiation induced injury can include skin redness, resembling a rash, tissue breakdown / ulcers and hair loss (which can be temporary or permanent).   The likelihood of either of these occurring depends on the difficulty of the procedure and whether you are sensitive to radiation due to previous procedures, disease, or genetic conditions.   IF your procedure requires a prolonged use of radiation, you will be notified and given written instructions for further action.  It is your responsibility to monitor the irradiated area for the 2 weeks following the procedure and to notify your physician if you are concerned that you have suffered a radiation induced injury.    All of the patient's and mother's questions were answered, patient is agreeable to proceed.   Consent signed and in chart.  Thank you for this interesting consult.  I greatly enjoyed meeting LUGENE DOLLISON and look forward to participating in their care.  A copy of this report was sent to the requesting provider on this date.  Electronically Signed: Lavonia Drafts, PA-C 02/05/2019, 11:02 AM   I spent a total of  30 Minutes   in face to face in clinical consultation, greater than 50% of which was counseling/coordinating care for cerebral arteriogram

## 2019-02-06 ENCOUNTER — Encounter (HOSPITAL_COMMUNITY): Payer: Self-pay | Admitting: Speech Pathology

## 2019-02-06 ENCOUNTER — Other Ambulatory Visit: Payer: Self-pay | Admitting: Student

## 2019-02-06 ENCOUNTER — Ambulatory Visit (HOSPITAL_COMMUNITY)
Admission: RE | Admit: 2019-02-06 | Discharge: 2019-02-06 | Disposition: A | Discharge status: Home / Self Care | Payer: Medicaid Other | Source: Ambulatory Visit | Attending: Neurology | Admitting: Neurology

## 2019-02-06 ENCOUNTER — Ambulatory Visit (HOSPITAL_COMMUNITY): Payer: Medicaid Other | Attending: Neurology | Admitting: Speech Pathology

## 2019-02-06 ENCOUNTER — Encounter (HOSPITAL_COMMUNITY): Payer: Self-pay

## 2019-02-06 ENCOUNTER — Other Ambulatory Visit (HOSPITAL_COMMUNITY): Payer: Self-pay | Admitting: Specialist

## 2019-02-06 ENCOUNTER — Ambulatory Visit (HOSPITAL_COMMUNITY)
Admission: RE | Admit: 2019-02-06 | Discharge: 2019-02-06 | Disposition: A | Discharge status: Home / Self Care | Payer: Medicaid Other | Source: Ambulatory Visit | Attending: Internal Medicine | Admitting: Internal Medicine

## 2019-02-06 ENCOUNTER — Other Ambulatory Visit: Payer: Self-pay

## 2019-02-06 ENCOUNTER — Other Ambulatory Visit (HOSPITAL_COMMUNITY): Payer: Medicaid Other

## 2019-02-06 DIAGNOSIS — R1311 Dysphagia, oral phase: Secondary | ICD-10-CM | POA: Diagnosis not present

## 2019-02-06 DIAGNOSIS — R1319 Other dysphagia: Secondary | ICD-10-CM

## 2019-02-06 DIAGNOSIS — R1312 Dysphagia, oropharyngeal phase: Secondary | ICD-10-CM | POA: Diagnosis not present

## 2019-02-06 NOTE — Therapy (Signed)
[facility] [place], [place], [address] Phone: [phone]   Fax:  [phone]  Modified Barium Swallow  Patient Details  Name: Taylor [name] MRN: [id number] Date of Birth: 01-Nov-1980 No data recorded  Encounter Date: 02/06/2019  End of Session - 02/06/19 1303    Visit Number  1    Number of Visits  1    Authorization Type  Medicaid    SLP Start Time  1100    SLP Stop Time   1136    SLP Time Calculation (min)  36 min    Activity Tolerance  Patient tolerated treatment well       Past Medical History:  Diagnosis Date  . Anxiety   . Bipolar disorder ([place])   . Cerebral artery occlusion with cerebral infarction ([place]) 11/13   due to elevated blood sugar 1700-off insulin 6 months  . Depression   . Diabetes mellitus    off insulin 6 months  . Headache(784.0)   . Hypertension   . Kidney dialysis status    on dialysis(stroke) off after 4-6 weeks  . Respiratory failure ([place]) 11/13   with stroke - in hosp ncbh 12 weeks  . Seizures ([place])    during elvated blood sugsr episode 11/13  . Sepsis ([place])   . Stroke ([place])   . Vocal cord dysfunction     Past Surgical History:  Procedure Laterality Date  . APPENDECTOMY    . INSERTION OF DIALYSIS CATHETER  11/13   removed in 4-6 weeks  . MULTIPLE EXTRACTIONS WITH ALVEOLOPLASTY N/A 02/24/2014   Procedure: MULTIPLE EXTRACTION WITH ALVEOLOPLASTY AND BIOPSY;  Surgeon: Gae Bon, DDS;  Location: [facility];  Service: Oral Surgery;  Laterality: N/A;  . TRACHEOSTOMY  11/13   closed 1/14  . TRANSRECTAL DRAINAGE OF PELVIC ABSCESS  12/11    There were no vitals filed for this visit.  Subjective Assessment - 02/06/19 1249    Subjective  "I get strangled on some foods."    Special Tests  MBSS    Currently in Pain?  No/denies          General - 02/06/19 1250      General Information   Date of Onset  11/14/18    HPI  Taylor [name] is a 38 y.o. female who was referred by Dr. Narda Amber for MBSS due to dysphagia from recent pontine infarct.  She was admitted to [facility] on 6/3 - 6/4 for acute onset of slurred speech and left facial droop.  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 Taylor [name] with old encephalomalacia from right PCA infarct and bithalamic infarct (2013?). She continues to have difficulty with speech and swallow.  She is using the thickener for liquids.  Her left facial droop and left-sided weakness has significantly improved.     Type of Study  MBS-Modified Barium Swallow Study    Diet Prior to this Study  Regular;Thin liquids;Nectar-thick liquids    Temperature Spikes Noted  No    Respiratory Status  Room air    History of Recent Intubation  No    Behavior/Cognition  Alert;Cooperative;Pleasant mood    Oral Cavity Assessment  Dry    Oral Care Completed by SLP  No    Oral Cavity - Dentition  Dentures, top;Dentures, bottom    Vision  Functional for self feeding    Self-Feeding  Abilities  Able to feed self    Patient Positioning  Upright in chair    Baseline Vocal Quality  Normal    Volitional Cough  Strong    Volitional Swallow  Able to elicit    Anatomy  Within functional limits    Pharyngeal Secretions  Not observed secondary MBS         Oral Preparation/Oral Phase - 02/06/19 1252      Oral Preparation/Oral Phase   Oral Phase  Impaired      Oral - Nectar   Oral - Nectar Cup  Other (Comment)   premature spillage     Oral - Thin   Oral - Thin Teaspoon  Other (Comment)   premature spillage   Oral - Thin Cup  Other (Comment)   premature spillage     Oral - Solids   Oral - Puree  Within functional limits    Oral - Regular  Oral residue;Decreased bolus cohesion    Oral - Pill  Within functional limits   in puree     Electrical stimulation - Oral Phase   Was Electrical Stimulation Used  No        Pharyngeal Phase - 02/06/19 1256      Pharyngeal Phase   Pharyngeal Phase  Impaired      Pharyngeal - Nectar   Pharyngeal- Nectar Cup  Delayed swallow initiation;Swallow initiation at pyriform sinus;Reduced airway/laryngeal closure;Penetration/Aspiration during swallow;Penetration/Apiration after swallow;Trace aspiration    Pharyngeal  Material does not enter airway;Material enters airway, passes BELOW cords then ejected out      Pharyngeal - Thin   Pharyngeal- Thin Teaspoon  Swallow initiation at vallecula;Swallow initiation at pyriform sinus;Reduced airway/laryngeal closure;Penetration/Aspiration during swallow;Trace aspiration    Pharyngeal  Material does not enter airway;Material enters airway, passes BELOW cords without attempt by patient to eject out (silent aspiration)    Pharyngeal- Thin Cup  Delayed swallow initiation;Swallow initiation at pyriform sinus;Reduced airway/laryngeal closure;Moderate aspiration;Trace aspiration;Penetration/Aspiration during swallow    Pharyngeal  Material does not enter airway;Material enters airway, remains ABOVE vocal cords then ejected out;Material enters airway, passes BELOW cords without attempt by patient to eject out (silent aspiration);Material enters airway, passes BELOW cords and not ejected out despite cough attempt by patient      Pharyngeal - Solids   Pharyngeal- Puree  Within functional limits;Swallow initiation at vallecula    Pharyngeal- Regular  Delayed swallow initiation-vallecula;Within functional limits    Pharyngeal- Pill  Within functional limits;Swallow initiation at vallecula   in puree     Electrical Stimulation - Pharyngeal Phase   Was Electrical Stimulation Used  No       Cricopharyngeal Phase - 02/06/19 1301      Cervical Esophageal Phase   Cervical Esophageal Phase  Within functional limits        Plan - 02/06/19 1303    Clinical Impression Statement  Taylor [name] presents with mild/mod oropharyngeal dysphagia  characterized by premature spillage of liquids over base of tongue with delay in swallow initiation and reduced laryngeal closure resulting in variable penetration and aspiration of thins and NTL during and after the swallow with and without sensation (silent at times). Taylor [name] reported getting "strangled" on solid foods, however she demonstrated adequate pharyngeal strength and no residuals with solids today. Taylor [name]'s primary deficit appears to be due to poor oral control (premature spillage) with delay in initiation and reduced vocal fold closure. Taylor [name] without pharyngeal residuals, but did have mild lingual residuals necessitating a repeat/dry swallow. Taylor [name] reports  that she drinks both thin and NTL liquids at home and denies h/o of PNA. Suspect that Taylor [name] has been aspirating thins, but has managed to avoid PNA. Recommend self regulated regular textures (she was encouraged to avoid known problem solid foods) and thin liquids with the following precautions: small sip, hold orally, swallow quickly, clear throat, and swallow again. The chin tuck and supraglottic was tried and found to be ineffective. Also recommend ENT consult to evaluate vocal folds to see if augmentation is possible and/or vocal fold adduction exercises. Taylor [name] reports occasional difficulties with swallow since her first stroke requiring trach in 2013. This study and all recommendations were reviewed with Taylor [name] and provided to her in written format. She was also given my contact information should her mother have questions as she did not attend the evaluation today. It is unclear whether Taylor [name] is still receiving [facility] SLP services and Taylor [name] is a poor historian.   Consulted and Agree with Plan of Care  Patient       Patient will benefit from skilled therapeutic intervention in order to improve the following deficits and impairments:   Dysphagia, oropharyngeal phase    Recommendations/Treatment - 02/06/19 1301      Swallow Evaluation Recommendations   Recommended  Consults  Consider ENT evaluation   to evaluate vocal folds   SLP Diet Recommendations  Age appropriate regular;Thin;Nectar    Liquid Administration via  No straw;Cup   Taylor [name] must take small sips for thins   Medication Administration  Whole meds with puree    Supervision  Patient able to self feed    Compensations  Small sips/bites;Multiple dry swallows after each bite/sip;Clear throat intermittently    Postural Changes  Seated upright at 90 degrees;Remain upright for at least 30 minutes after feeds/meals       Prognosis - 02/06/19 1302      Prognosis   Prognosis for Safe Diet Advancement  Fair      Individuals Consulted   Consulted and Agree with Results and Recommendations  Patient    Report Sent to   Referring physician       Problem List Patient Active Problem List   Diagnosis Date Noted  . Intracranial carotid stenosis, left 01/23/2019  . 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 Tobacco use disorder 01/02/2015  . Cognitive impairment 01/02/2015  . Stroke ([place]) 09/07/2012  . Pain in upper limb 09/07/2012  . Pain in lower limb 09/07/2012  . Bipolar affective disorder ([place]) 09/07/2012  . Depressive disorder 09/07/2012  . Type II diabetes mellitus ([place]) 09/07/2012  . Memory loss 09/07/2012  . 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 Personal history of noncompliance with medical treatment, presenting hazards to health 05/25/2012  . Cerebral artery occlusion with cerebral infarction [facility]) 05/10/2012   Thank you,  Genene Churn, [place] 02/06/2019, 1:04 PM  [facility] [address] [place], [place], [address] Phone: [phone]   Fax:  [phone]  Name: JADAYAH BOLEY MRN: [id number] Date of Birth: Jul 22, 1980

## 2019-02-07 ENCOUNTER — Other Ambulatory Visit (HOSPITAL_COMMUNITY): Payer: Self-pay | Admitting: Interventional Radiology

## 2019-02-07 ENCOUNTER — Ambulatory Visit (HOSPITAL_COMMUNITY)
Admission: RE | Admit: 2019-02-07 | Discharge: 2019-02-07 | Disposition: A | Discharge status: Home / Self Care | Payer: Medicaid Other | Source: Ambulatory Visit | Attending: Interventional Radiology | Admitting: Interventional Radiology

## 2019-02-07 DIAGNOSIS — I6320 Cerebral infarction due to unspecified occlusion or stenosis of unspecified precerebral arteries: Secondary | ICD-10-CM | POA: Diagnosis not present

## 2019-02-07 DIAGNOSIS — E119 Type 2 diabetes mellitus without complications: Secondary | ICD-10-CM | POA: Insufficient documentation

## 2019-02-07 DIAGNOSIS — F319 Bipolar disorder, unspecified: Secondary | ICD-10-CM | POA: Insufficient documentation

## 2019-02-07 DIAGNOSIS — F419 Anxiety disorder, unspecified: Secondary | ICD-10-CM | POA: Insufficient documentation

## 2019-02-07 DIAGNOSIS — Z7982 Long term (current) use of aspirin: Secondary | ICD-10-CM | POA: Insufficient documentation

## 2019-02-07 DIAGNOSIS — Z7984 Long term (current) use of oral hypoglycemic drugs: Secondary | ICD-10-CM | POA: Insufficient documentation

## 2019-02-07 DIAGNOSIS — I10 Essential (primary) hypertension: Secondary | ICD-10-CM | POA: Insufficient documentation

## 2019-02-07 DIAGNOSIS — Z79899 Other long term (current) drug therapy: Secondary | ICD-10-CM | POA: Diagnosis not present

## 2019-02-07 DIAGNOSIS — Z7902 Long term (current) use of antithrombotics/antiplatelets: Secondary | ICD-10-CM | POA: Insufficient documentation

## 2019-02-07 DIAGNOSIS — F17210 Nicotine dependence, cigarettes, uncomplicated: Secondary | ICD-10-CM | POA: Diagnosis not present

## 2019-02-07 HISTORY — PX: IR US GUIDE VASC ACCESS RIGHT: IMG2390

## 2019-02-07 HISTORY — PX: IR ANGIOGRAM EXTREMITY LEFT: IMG651

## 2019-02-07 HISTORY — PX: IR ANGIO INTRA EXTRACRAN SEL COM CAROTID INNOMINATE BILAT MOD SED: IMG5360

## 2019-02-07 HISTORY — PX: IR ANGIO VERTEBRAL SEL VERTEBRAL BILAT MOD SED: IMG5369

## 2019-02-07 MED ORDER — FENTANYL CITRATE (PF) 100 MCG/2ML IJ SOLN
INTRAMUSCULAR | Status: AC
  Filled 2019-02-07: qty 2

## 2019-02-07 MED ORDER — ACETAMINOPHEN 500 MG PO TABS
ORAL_TABLET | ORAL | Status: AC
  Filled 2019-02-07: qty 1

## 2019-02-07 MED ORDER — HEPARIN SODIUM (PORCINE) 1000 UNIT/ML IJ SOLN
INTRAMUSCULAR | Status: AC
  Filled 2019-02-07: qty 1

## 2019-02-07 MED ORDER — LIDOCAINE HCL 1 % IJ SOLN
INTRAMUSCULAR | Status: AC
  Filled 2019-02-07: qty 20

## 2019-02-07 MED ORDER — VERAPAMIL HCL 2.5 MG/ML IV SOLN
INTRAVENOUS | Status: AC
  Filled 2019-02-07: qty 2

## 2019-02-07 MED ORDER — IOHEXOL 300 MG/ML  SOLN
50.0000 mL | Freq: Once | INTRAMUSCULAR | Status: AC | PRN
  Administered 2019-02-07: 15:00:00 15 mL via INTRA_ARTERIAL

## 2019-02-07 MED ORDER — DIPHENHYDRAMINE HCL 50 MG/ML IJ SOLN
INTRAMUSCULAR | Status: AC
  Filled 2019-02-07: qty 1

## 2019-02-07 MED ORDER — ACETAMINOPHEN 500 MG PO TABS
500.0000 mg | ORAL_TABLET | Freq: Four times a day (QID) | ORAL | Status: DC | PRN
  Administered 2019-02-07: 500 mg via ORAL
  Filled 2019-02-07: qty 1

## 2019-02-07 MED ORDER — VERAPAMIL HCL 2.5 MG/ML IV SOLN
INTRAVENOUS | Status: AC | PRN
  Administered 2019-02-07: 2.5 mg via INTRA_ARTERIAL

## 2019-02-07 MED ORDER — SODIUM CHLORIDE 0.9 % IV SOLN: INTRAVENOUS | Status: AC

## 2019-02-07 MED ORDER — NITROGLYCERIN 1 MG/10 ML FOR IR/CATH LAB
INTRA_ARTERIAL | Status: AC | PRN
  Administered 2019-02-07 (×2): 200 ug via INTRA_ARTERIAL

## 2019-02-07 MED ORDER — IOHEXOL 300 MG/ML  SOLN
150.0000 mL | Freq: Once | INTRAMUSCULAR | Status: AC | PRN
  Administered 2019-02-07: 75 mL via INTRA_ARTERIAL

## 2019-02-07 MED ORDER — HEPARIN SODIUM (PORCINE) 1000 UNIT/ML IJ SOLN
INTRAMUSCULAR | Status: AC | PRN
  Administered 2019-02-07: 2000 [IU]

## 2019-02-07 MED ORDER — NITROGLYCERIN 1 MG/10 ML FOR IR/CATH LAB
INTRA_ARTERIAL | Status: AC
  Filled 2019-02-07: qty 10

## 2019-02-07 MED ORDER — IBUPROFEN 800 MG PO TABS
800.0000 mg | ORAL_TABLET | Freq: Once | ORAL | Status: AC
  Administered 2019-02-07: 17:00:00 800 mg via ORAL
  Filled 2019-02-07: qty 1

## 2019-02-07 MED ORDER — FENTANYL CITRATE (PF) 100 MCG/2ML IJ SOLN
INTRAMUSCULAR | Status: AC | PRN
  Administered 2019-02-07: 25 ug via INTRAVENOUS

## 2019-02-07 MED ORDER — MIDAZOLAM HCL 2 MG/2ML IJ SOLN
INTRAMUSCULAR | Status: AC | PRN
  Administered 2019-02-07: 1 mg via INTRAVENOUS

## 2019-02-07 MED ORDER — MIDAZOLAM HCL 2 MG/2ML IJ SOLN
INTRAMUSCULAR | Status: AC
  Filled 2019-02-07: qty 2

## 2019-02-07 MED ORDER — DIPHENHYDRAMINE HCL 50 MG/ML IJ SOLN
INTRAMUSCULAR | Status: AC | PRN
  Administered 2019-02-07: 25 mg via INTRAVENOUS

## 2019-02-07 NOTE — Progress Notes (Signed)
Patient procedure to be delayed to CODE STROKE.   Patient and father notified.   Patient states she has dry mouth and back pain due to sitting in bed all morning.   Patient educated on importance of NPO prior to procedure.  Tylenol 500 mg for back pain.   Patient and father agree to wait for now, proceed with diagnostic angiogram after code stroke as patient has an allergy to contrast and has already taken her premedications.  They understand that additional option would be to reschedule.    Brynda Greathouse, MS RD PA-C

## 2019-02-07 NOTE — Procedures (Signed)
S/P 4 vessel cerebral arteriogram. RT radial approach. Findings. 1.Severe preocclusive stenosis of dominnat Lt VA origin from the aortic arch. 2.aprox 70 % stenosis of Lt ICA cav seg. 3.Approx 50 % stenosis of RT ICA cav seg. 4.Approx 50 % stenosis Lt ICA prox associated with an ulceration. S.Caron Tardif MD

## 2019-02-07 NOTE — Sedation Documentation (Signed)
Oxygen/ETo2 removed per MD.

## 2019-02-07 NOTE — Discharge Instructions (Addendum)
Cerebral Angiogram  A cerebral angiogram is a procedure that is used to examine the blood vessels in the brain and neck. In this procedure, contrast dye is injected through a long, thin tube (catheter) into an artery. X-rays are then taken, which show if there is a blockage or problem in a blood vessel. Tell a health care provider about:  Any allergies you have, including allergies to shellfish, contrast dye, or iodine  All medicines you are taking, including vitamins, herbs, eye drops, creams, and over-the-counter medicines.  Any problems you or family members have had with anesthetic medicines.  Any blood disorders you have.  Any surgeries you have had.  Any medical conditions you have.  Whether you are pregnant or may be pregnant.  Whether you are currently breastfeeding. What are the risks? Generally, this is a safe procedure. However, problems may occur, including:  Damage to surrounding nerves, tissues, or structures.  Blood clot.  Inability to remember what happened (amnesia). This is usually temporary.  Weakness, numbness, speech, or vision problems. This is usually temporary.  Stroke.  Kidney injury.  Bleeding or bruising.  Allergic reaction medicines or dyes.  Infection. What happens before the procedure? Staying hydrated Follow instructions from your health care provider about hydration, which may include:  Up to 2 hours before the procedure - you may continue to drink clear liquids, such as water, clear fruit juice, black coffee, and plain tea. Eating and drinking restrictions Follow instructions from your health care provider about eating and drinking, which may include:  8 hours before the procedure - stop eating heavy meals or foods such as meat, fried foods, or fatty foods.  6 hours before the procedure - stop eating light meals or foods, such as toast or cereal.  6 hours before the procedure - stop drinking milk or drinks that contain milk.  2  hours before the procedure - stop drinking clear liquids. General instructions  Ask your health care provider about: ? Changing or stopping your regular medicines. This is especially important if you are taking diabetes medicines or blood thinners. ? Taking medicines such as aspirin or ibuprofen. These medicines can thin your blood. Do not take these medicines before your procedure if your health care provider asks you not to.  You may have blood tests done.  Plan to have someone take you home from the hospital or clinic.  If you will be going home right after the procedure, plan to have someone with you for 24 hours. What happens during the procedure?  To reduce your risk of infection: ? Your health care team will wash or sanitize their hands. ? Your skin will be washed with soap. ? Hair may be removed from the surgical area.  You will lie on your back on an imaging bed with an X-ray machine around you.  Your head will be secured to the bed with a strap or device to help you keep still.  An IV tube will be inserted into one of your veins.  You will be given one or more of the following: ? A medicine to help you relax (sedative). ? A medicine to numb the area (local anesthetic) where the catheter will be inserted. This is usually your groin, leg, or arm.  Your heart rate and other vital signs will be watched carefully. You may have electrodes placed on your chest.  A small cut (incision) will be made where the catheter will be inserted.  The catheter will be inserted into  an artery that leads to the head. You may feel slight pressure.  The catheter will be moved through the body up to your neck and brain. X-ray images will help your health care provider bring the catheter to the correct location.  The dye will be injected into the catheter and will travel to your head or neck area. You may feel a warming or burning sensation or notice a strange taste in your mouth as the dye goes  through your system.  Images will be taken to show how the dye flows through the area.  While the images are being taken, you may be given instructions on breathing, swallowing, moving, or talking.  When the images are finished, the catheter will be slowly removed.  Pressure will be applied to the skin to stop any bleeding. A tight bandage (dressing) or seal will be applied to the skin.  Your IV will be removed. The procedure may vary among health care providers and hospitals. What happens after the procedure?  Your blood pressure, heart rate, breathing rate, and blood oxygen level will be monitored until the medicines you were given have worn off.  You will be asked to lie flat for several hours. The arm or leg where the catheter was inserted will need to be kept straight while you are in the recovery room.  The insertion site will be watched for bleeding and you will be checked often.  You will be instructed to drink plenty of fluids. This will help wash the contrast dye out of your system.  Do not drive for 24 hours if you received a sedative.  It is up to you to get the results of your procedure. Ask your health care provider, or the department that is doing the procedure, when your results will be ready. Summary  A cerebral angiogram is a procedure that is used to examine the blood vessels in the brain and neck.  In this procedure, contrast dye is injected through a long, thin tube (catheter) into an artery. X-rays are then taken, which show if there is a blockage or problem in a blood vessel.  You will be given a sedative to help you relax during the procedure. A local anesthetic will be used to numb the area where the catheter is inserted. You may feel pressure when the catheter is inserted, and you may feel a warm sensation when the dye is injected.  After the procedure, you will be asked to lie flat for several hours. The arm or leg where the catheter was inserted will need  to be kept straight while you are in the recovery room. This information is not intended to replace advice given to you by your health care provider. Make sure you discuss any questions you have with your health care provider. Document Released: 10/14/2013 Document Revised: 05/12/2017 Document Reviewed: 07/04/2016 Elsevier Patient Education  2020 [place]  This sheet gives you information about how to care for yourself after your procedure. Your health care provider may also give you more specific instructions. If you have problems or questions, contact your health care provider. What can I expect after the procedure? After the procedure, it is common to have:  Bruising and tenderness at the catheter insertion area. Follow these instructions at home: Medicines  Take over-the-counter and prescription medicines only as told by your health care provider. Insertion site care  Follow instructions from your health care provider about how to take care of your  insertion site. Make sure you: ? Wash your hands with soap and water before you change your bandage (dressing). If soap and water are not available, use hand sanitizer. ? Change your dressing as told by your health care provider. ? Leave stitches (sutures), skin glue, or adhesive strips in place. These skin closures may need to stay in place for 2 weeks or longer. If adhesive strip edges start to loosen and curl up, you may trim the loose edges. Do not remove adhesive strips completely unless your health care provider tells you to do that.  Check your insertion site every day for signs of infection. Check for: ? Redness, swelling, or pain. ? Fluid or blood. ? Pus or a bad smell. ? Warmth.  Do not take baths, swim, or use a hot tub until your health care provider approves.  You may shower 24-48 hours after the procedure, or as directed by your health care provider. ? Remove the dressing and gently wash the site  with plain soap and water. ? Pat the area dry with a clean towel. ? Do not rub the site. That could cause bleeding.  Do not apply powder or lotion to the site. Activity   For 24 hours after the procedure, or as directed by your health care provider: ? Do not flex or bend the affected arm. ? Do not push or pull heavy objects with the affected arm. ? Do not drive yourself home from the hospital or clinic. You may drive 24 hours after the procedure unless your health care provider tells you not to. ? Do not operate machinery or power tools.  Do not lift anything that is heavier than 10 lb (4.5 kg), or the limit that you are told, until your health care provider says that it is safe.  Ask your health care provider when it is okay to: ? Return to work or school. ? Resume usual physical activities or sports. ? Resume sexual activity. General instructions  If the catheter site starts to bleed, raise your arm and put firm pressure on the site. If the bleeding does not stop, get help right away. This is a medical emergency.  If you went home on the same day as your procedure, a responsible adult should be with you for the first 24 hours after you arrive home.  Keep all follow-up visits as told by your health care provider. This is important. Contact a health care provider if:  You have a fever.  You have redness, swelling, or yellow drainage around your insertion site. Get help right away if:  You have unusual pain at the radial site.  The catheter insertion area swells very fast.  The insertion area is bleeding, and the bleeding does not stop when you hold steady pressure on the area.  Your arm or hand becomes pale, cool, tingly, or numb. These symptoms may represent a serious problem that is an emergency. Do not wait to see if the symptoms will go away. Get medical help right away. Call your local emergency services (911 in the [place] Do not drive yourself to the  hospital. Summary  After the procedure, it is common to have bruising and tenderness at the site.  Follow instructions from your health care provider about how to take care of your radial site wound. Check the wound every day for signs of infection.  Do not lift anything that is heavier than 10 lb (4.5 kg), or the limit that you are told, until your  health care provider says that it is safe. This information is not intended to replace advice given to you by your health care provider. Make sure you discuss any questions you have with your health care provider. Document Released: 07/02/2010 Document Revised: 07/05/2017 Document Reviewed: 07/05/2017 Elsevier Patient Education  2020 [place].  Moderate Conscious Sedation, Adult, Care After These instructions provide you with information about caring for yourself after your procedure. Your health care provider may also give you more specific instructions. Your treatment has been planned according to current medical practices, but problems sometimes occur. Call your health care provider if you have any problems or questions after your procedure. What can I expect after the procedure? After your procedure, it is common:  To feel sleepy for several hours.  To feel clumsy and have poor balance for several hours.  To have poor judgment for several hours.  To vomit if you eat too soon. Follow these instructions at home: For at least 24 hours after the procedure:   Do not: ? Participate in activities where you could fall or become injured. ? Drive. ? Use heavy machinery. ? Drink alcohol. ? Take sleeping pills or medicines that cause drowsiness. ? Make important decisions or sign legal documents. ? Take care of children on your own.  Rest. Eating and drinking  Follow the diet recommended by your health care provider.  If you vomit: ? Drink water, juice, or soup when you can drink without vomiting. ? Make sure you have little or no  nausea before eating solid foods. General instructions  Have a responsible adult stay with you until you are awake and alert.  Take over-the-counter and prescription medicines only as told by your health care provider.  If you smoke, do not smoke without supervision.  Keep all follow-up visits as told by your health care provider. This is important. Contact a health care provider if:  You keep feeling nauseous or you keep vomiting.  You feel light-headed.  You develop a rash.  You have a fever. Get help right away if:  You have trouble breathing. This information is not intended to replace advice given to you by your health care provider. Make sure you discuss any questions you have with your health care provider. Document Released: 03/20/2013 Document Revised: 05/12/2017 Document Reviewed: 09/19/2015 Elsevier Patient Education  2020 [employer].

## 2019-02-07 NOTE — Sedation Documentation (Signed)
Patient using bathroom at this time. Dr. Estanislado Pandy talking to family.

## 2019-02-07 NOTE — Consult Note (Signed)
Chief Complaint: Patient was seen in consultation today for history of stroke  Referring Physician(s): Deveshwar,Sanjeev  Supervising Physician: Luanne Bras  Patient Status: [facility] - Out-pt  History of Present Illness: Taylor [name] is a 38 y.o. female with history of several prior strokes in 2013, and June AND July of 2020 with residual left-sided weakness, right-sided upper extremity numbness, and memory impairment.  Patient was seen in follow-up by Neurology after discharge from her 2020 strokes.  Referred to [facility] for diagnostic angiogram for severe distal left ICA stenosis.   Patient was scheduled for procedure Monday 02/05/19. However was rescheduled due to acute, Code Stroke.  Patient does have a contrast allergy and was again premedicated overnight.  Labs from 02/05/19 appropriate for procedure and therefore not repeated today. She again presents in her usual state of health.  She has been NPO.  She is accompanied by her father today.  States she is still smoking.   Past Medical History:  Diagnosis Date   Anxiety    Bipolar disorder ([place])    Cerebral artery occlusion with cerebral infarction ([place]) 11/13   due to elevated blood sugar 1700-off insulin 6 months   Depression    Diabetes mellitus    off insulin 6 months   Headache(784.0)    Hypertension    Kidney dialysis status    on dialysis(stroke) off after 4-6 weeks   Respiratory failure ([place]) 11/13   with stroke - in hosp [facility] 12 weeks   Seizures ([place])    during elvated blood sugsr episode 11/13   Sepsis ([place])    Stroke [facility])    Vocal cord dysfunction     Past Surgical History:  Procedure Laterality Date   APPENDECTOMY     INSERTION OF DIALYSIS CATHETER  11/13   removed in 4-6 weeks   MULTIPLE EXTRACTIONS WITH ALVEOLOPLASTY N/A 02/24/2014   Procedure: MULTIPLE EXTRACTION WITH ALVEOLOPLASTY AND BIOPSY;  Surgeon: Gae Bon, DDS;  Location: [place];  Service: Oral Surgery;  Laterality: N/A;     TRACHEOSTOMY  11/13   closed 1/14   TRANSRECTAL DRAINAGE OF PELVIC ABSCESS  12/11    Allergies: Contrast media [iodinated diagnostic agents], Amitriptyline, Compazine, Oxycodone, Prochlorperazine, Promethazine, and Sumatriptan  Medications: Prior to Admission medications   Medication Sig Start Date End Date Taking? Authorizing Provider  acetaminophen (TYLENOL) 325 MG tablet Take 650 mg by mouth every 6 (six) hours as needed. pain   Yes [provider]  aspirin EC 325 MG tablet Take 325 mg by mouth daily.    Yes [provider]  atorvastatin (LIPITOR) 40 MG tablet Take 40 mg by mouth daily.   Yes [provider]  atorvastatin (LIPITOR) 80 MG tablet Take 1 tablet (80 mg total) by mouth daily. 01/24/19  Yes Patel, Donika K, DO  clopidogrel (PLAVIX) 75 MG tablet Take 1 tablet (75 mg total) by mouth daily. 11/26/18  Yes Patel, Donika K, DO  dapagliflozin propanediol (FARXIGA) 10 MG TABS tablet Take 10 mg by mouth every morning.    Yes [provider]  diazepam (VALIUM) 5 MG tablet Take 5 mg by mouth 2 (two) times daily.    Yes [provider]  DULoxetine (CYMBALTA) 30 MG capsule Take 30 mg by mouth daily.   Yes [provider]  gabapentin (NEURONTIN) 300 MG capsule Take 3 tablets in the morning, 3 tab in the afternoon, and 4 tablets at bedtime. Patient taking differently: Take 900-1,200 mg by mouth See admin instructions. Take  3 tablets in the morning, 3 tab in the afternoon, and 4 tablets at bedtime. 11/26/18  Yes Patel, Donika K, DO  ibuprofen (ADVIL) 600 MG tablet Take 600 mg by mouth 2 (two) times daily.  11/16/18  Yes [provider]  labetalol (NORMODYNE) 300 MG tablet Take 300 mg by mouth 2 (two) times daily.   Yes [provider]  levETIRAcetam (KEPPRA XR) 500 MG 24 hr tablet Take 1,000 mg by mouth daily.   Yes [provider]  lisinopril (ZESTRIL) 40 MG tablet Take 40 mg by mouth daily.    Yes [provider]  metFORMIN (GLUCOPHAGE) 500 MG tablet Take 500 mg by mouth 2 (two) times daily. 11/16/18  Yes [provider]  omeprazole (PRILOSEC) 20 MG capsule Take 20 mg by mouth daily.   Yes [provider]  risperiDONE (RISPERDAL) 0.5 MG tablet Take 0.5 mg by mouth at bedtime.   Yes [provider]  traZODone (DESYREL) 100 MG tablet Take 200 mg by mouth at bedtime.    Yes [provider]     Family History  Problem Relation Age of Onset   Lung cancer Maternal Grandmother    Heart attack Maternal Grandfather    Diabetes Paternal Grandfather     Social History   Socioeconomic History   Marital status: Single    Spouse name: Not on file   Number of children: 0   Years of education: 14   Highest education level: Not on file  Occupational History   Not on file  Social Needs   Financial resource strain: Not on file   Food insecurity    Worry: Not on file    Inability: Not on file   Transportation needs    Medical: Not on file    Non-medical: Not on file  Tobacco Use   Smoking status: Current Every Day Smoker    Packs/day: 0.60    Years: 18.00    Pack years: 10.80    Types: Cigarettes   Smokeless tobacco: Never Used  Substance and Sexual Activity   Alcohol use: No    Alcohol/week: 0.0 standard drinks   Drug use: No   Sexual activity: Not Currently    Birth control/protection: None  Lifestyle   Physical activity    Days per week: Not on file    Minutes per session: Not on file   Stress: Not on file  Relationships   Social connections    Talks on phone: Not on file    Gets together: Not on file    Attends religious service: Not on file    Active member of club or organization: Not on file    Attends meetings of clubs or organizations: Not on file    Relationship status: Not on file  Other Topics Concern   Not on file  Social History Narrative   She is single and does not have any children.    She has 2 yrs  of college level education.   She has 5-6 caffeine drinks daily.    Left handed      Living with parents.     Review of Systems: A 12 point ROS discussed and pertinent positives are indicated in the HPI above.  All other systems are negative.  Review of Systems  Constitutional: Negative for fatigue and fever.  Respiratory: Negative for cough and shortness of breath.   Cardiovascular: Negative for chest pain.  Gastrointestinal: Negative for abdominal pain.  Musculoskeletal: Positive for  back pain (chronic, since most recent stroke).  Psychiatric/Behavioral: Negative for behavioral problems and confusion.    Vital Signs: BP (!) 158/86    Pulse 88    Temp 97.7 F (36.5 C) (Skin)    Resp 16    Ht 5\' 5"  (1.651 m)    Wt 214 lb (97.1 kg)    LMP 01/29/2019 (Exact Date)    SpO2 96%    BMI 35.61 kg/m   Physical Exam Vitals signs and nursing note reviewed.  Constitutional:      Appearance: Normal appearance.  HENT:     Mouth/Throat:     Mouth: Mucous membranes are moist.     Pharynx: Oropharynx is clear.  Cardiovascular:     Rate and Rhythm: Normal rate and regular rhythm.  Pulmonary:     Effort: Pulmonary effort is normal. No respiratory distress.     Breath sounds: Normal breath sounds.  Skin:    General: Skin is warm and dry.  Neurological:     General: No focal deficit present.     Mental Status: She is alert and oriented to person, place, and time.  Psychiatric:        Mood and Affect: Mood normal.        Behavior: Behavior normal.        Thought Content: Thought content normal.        Judgment: Judgment normal.      MD Evaluation Airway: WNL Heart: WNL Abdomen: WNL Chest/ Lungs: WNL ASA  Classification: 3 Mallampati/Airway Score: Two   Imaging: Dg Swallowing Func-speech Pathology  Result Date: 02/06/2019 [place] [address] [place], [place], [address] Phone: [phone]   Fax:  [phone]  Modified Barium  Swallow  Patient Details Name: ROZALIA FREGOSO MRN: [id number] Date of Birth: Aug 13, 1980 No data recorded  Encounter Date: 02/06/2019     End of Session - 02/06/19 1303   Visit Number  1   Number of Visits  1   Authorization Type  Medicaid   SLP Start Time  1100   SLP Stop Time   [id number]   SLP Time Calculation (min)  36 min   Activity Tolerance  Patient tolerated treatment well       Past Medical History: Diagnosis Date  Anxiety   Bipolar disorder ([place])   Cerebral artery occlusion with cerebral infarction ([place]) 11/13  due to elevated blood sugar 1700-off insulin 6 months  Depression   Diabetes mellitus   off insulin 6 months  Headache(784.0)   Hypertension   Kidney dialysis status   on dialysis(stroke) off after 4-6 weeks  Respiratory failure ([place]) 11/13  with stroke - in hosp ncbh 12 weeks  Seizures ([place])   during elvated blood sugsr episode 11/13  Sepsis ([place])   Stroke ([place])   Vocal cord dysfunction       Past Surgical History: Procedure Laterality Date  APPENDECTOMY    INSERTION OF DIALYSIS CATHETER  11/13  removed in 4-6 weeks  MULTIPLE EXTRACTIONS WITH ALVEOLOPLASTY N/A 02/24/2014  Procedure: MULTIPLE EXTRACTION WITH ALVEOLOPLASTY AND BIOPSY;  Surgeon: Gae Bon, DDS;  Location: [place];  Service: Oral Surgery;  Laterality: N/A;  TRACHEOSTOMY  11/13  closed 1/14  TRANSRECTAL DRAINAGE OF PELVIC ABSCESS  12/11  There were no vitals filed for this visit.     Subjective Assessment - 02/06/19 1249   Subjective  "I get strangled on some foods."   Special Tests  MBSS   Currently  in Pain?  No/denies     General - 02/06/19 1250         General Information  Date of Onset  11/14/18   HPI  Mialee Mcfarlane is a 38 y.o. female who was referred by Dr. Narda Amber for MBSS due to dysphagia from recent pontine infarct.  She was admitted to [facility] on 6/3 - 6/4 for acute onset of slurred speech and left facial droop.  Cerebrovascular disease with left thalamic  infarct (7/20) and left paramedian pontine stroke (11/2018) with residual dysarthria, mild dysphagia, and right sided painful paresthesias. Pt with old encephalomalacia from right PCA infarct and bithalamic infarct (2013?). She continues to have difficulty with speech and swallow.  She is using the thickener for liquids.  Her left facial droop and left-sided weakness has significantly improved.    Type of Study  MBS-Modified Barium Swallow Study   Diet Prior to this Study  Regular;Thin liquids;Nectar-thick liquids   Temperature Spikes Noted  No   Respiratory Status  Room air   History of Recent Intubation  No   Behavior/Cognition  Alert;Cooperative;Pleasant mood   Oral Cavity Assessment  Dry   Oral Care Completed by SLP  No   Oral Cavity - Dentition  Dentures, top;Dentures, bottom   Vision  Functional for self feeding   Self-Feeding Abilities  Able to feed self   Patient Positioning  Upright in chair   Baseline Vocal Quality  Normal   Volitional Cough  Strong   Volitional Swallow  Able to elicit   Anatomy  Within functional limits   Pharyngeal Secretions  Not observed secondary MBS         Oral Preparation/Oral Phase - 02/06/19 1252         Oral Preparation/Oral Phase  Oral Phase  Impaired      Oral - Nectar  Oral - Nectar Cup  Other (Comment)  premature spillage     Oral - Thin  Oral - Thin Teaspoon  Other (Comment)  premature spillage  Oral - Thin Cup  Other (Comment)  premature spillage     Oral - Solids  Oral - Puree  Within functional limits   Oral - Regular  Oral residue;Decreased bolus cohesion   Oral - Pill  Within functional limits  in puree     Electrical stimulation - Oral Phase  Was Electrical Stimulation Used  No       Pharyngeal Phase - 02/06/19 1256         Pharyngeal Phase  Pharyngeal Phase  Impaired      Pharyngeal - Nectar  Pharyngeal- Nectar Cup  Delayed swallow initiation;Swallow initiation at pyriform sinus;Reduced airway/laryngeal  closure;Penetration/Aspiration during swallow;Penetration/Apiration after swallow;Trace aspiration   Pharyngeal  Material does not enter airway;Material enters airway, passes BELOW cords then ejected out      Pharyngeal - Thin  Pharyngeal- Thin Teaspoon  Swallow initiation at vallecula;Swallow initiation at pyriform sinus;Reduced airway/laryngeal closure;Penetration/Aspiration during swallow;Trace aspiration   Pharyngeal  Material does not enter airway;Material enters airway, passes BELOW cords without attempt by patient to eject out (silent aspiration)   Pharyngeal- Thin Cup  Delayed swallow initiation;Swallow initiation at pyriform sinus;Reduced airway/laryngeal closure;Moderate aspiration;Trace aspiration;Penetration/Aspiration during swallow   Pharyngeal  Material does not enter airway;Material enters airway, remains ABOVE vocal cords then ejected out;Material enters airway, passes BELOW cords without attempt by patient to eject out (silent aspiration);Material enters airway, passes BELOW cords and not ejected out despite cough attempt by patient  Pharyngeal - Solids  Pharyngeal- Puree  Within functional limits;Swallow initiation at vallecula   Pharyngeal- Regular  Delayed swallow initiation-vallecula;Within functional limits   Pharyngeal- Pill  Within functional limits;Swallow initiation at vallecula  in puree     Electrical Stimulation - Pharyngeal Phase  Was Electrical Stimulation Used  No       Cricopharyngeal Phase - 02/06/19 1301         Cervical Esophageal Phase  Cervical Esophageal Phase  Within functional limits       Plan - 02/06/19 1303   Clinical Impression Statement  Pt presents with mild/mod oropharyngeal dysphagia characterized by premature spillage of liquids over base of tongue with delay in swallow initiation and reduced laryngeal closure resulting in variable penetration and aspiration of thins and NTL during and after the swallow with and without sensation (silent  at times). Pt reported getting "strangled" on solid foods, however she demonstrated adequate pharyngeal strength and no residuals with solids today. Pt's primary deficit appears to be due to poor oral control (premature spillage) with delay in initiation and reduced vocal fold closure. Pt without pharyngeal residuals, but did have mild lingual residuals necessitating a repeat/dry swallow. Pt reports that she drinks both thin and NTL liquids at home and denies h/o of PNA. Suspect that Pt has been aspirating thins, but has managed to avoid PNA. Recommend self regulated regular textures (she was encouraged to avoid known problem solid foods) and thin liquids with the following precautions: small sip, hold orally, swallow quickly, clear throat, and swallow again. The chin tuck and supraglottic was tried and found to be ineffective. Also recommend ENT consult to evaluate vocal folds to see if augmentation is possible and/or vocal fold adduction exercises. Pt reports occasional difficulties with swallow since her first stroke requiring trach in 2013. This study and all recommendations were reviewed with Pt and provided to her in written format. She was also given my contact information should her mother have questions as she did not attend the evaluation today. It is unclear whether Pt is still receiving [place] SLP services and Pt is a poor historian.  Consulted and Agree with Plan of Care  Patient     Patient will benefit from skilled therapeutic intervention in order to improve the following deficits and impairments:  Dysphagia, oropharyngeal phase      Recommendations/Treatment - 02/06/19 1301         Swallow Evaluation Recommendations  Recommended Consults  Consider ENT evaluation  to evaluate vocal folds  SLP Diet Recommendations  Age appropriate regular;Thin;Nectar   Liquid Administration via  No straw;Cup  Pt must take small sips for thins  Medication Administration  Whole meds with puree   Supervision   Patient able to self feed   Compensations  Small sips/bites;Multiple dry swallows after each bite/sip;Clear throat intermittently   Postural Changes  Seated upright at 90 degrees;Remain upright for at least 30 minutes after feeds/meals       Prognosis - 02/06/19 1302         Prognosis  Prognosis for Safe Diet Advancement  Fair      Individuals Consulted  Consulted and Agree with Results and Recommendations  Patient   Report Sent to   Referring physician    Problem List    Patient Active Problem List  Diagnosis Date Noted  Intracranial carotid stenosis, left 01/23/2019  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  Tobacco use disorder 01/02/2015  Cognitive impairment 01/02/2015  Stroke ([place]) 09/07/2012  Pain in upper limb 09/07/2012  Pain in lower limb 09/07/2012  Bipolar affective disorder ([place]) 09/07/2012  Depressive disorder 09/07/2012  Type II diabetes mellitus ([place]) 09/07/2012  Memory loss 09/07/2012  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  Personal history of noncompliance with medical treatment, presenting hazards to health 05/25/2012  Cerebral artery occlusion with cerebral infarction [facility]) 05/10/2012 Thank you,  Genene Churn, [place]  [place] 02/06/2019, 1:04 PM  [place], [place], [address] Phone: [phone]   Fax:  [phone]  Name: LYNSEE PRIBBLE MRN: [id number] Date of Birth: 1981-05-26    Electronically signed by Ephraim Hamburger, CCC-SLP at 02/06/2019 1:17 PM    Labs:  CBC: Recent Labs    11/14/18 1521 02/05/19 1051  WBC 8.3 13.3*  HGB 11.7* 12.1  HCT 37.2 38.5  PLT 183 212    COAGS: Recent Labs    02/05/19 1051  INR 1.1    BMP: Recent Labs    11/14/18 1521 02/05/19 1051  NA 135 138  K 4.0 4.1  CL 101 103  CO2 23 20*  GLUCOSE 235* 238*  BUN 14 19  CALCIUM 9.0 9.4  CREATININE 0.72 0.94  GFRNONAA >60 >60  GFRAA >60  >60    LIVER FUNCTION TESTS: No results for input(s): BILITOT, AST, ALT, ALKPHOS, PROT, ALBUMIN in the last 8760 hours.  TUMOR MARKERS: No results for input(s): AFPTM, CEA, CA199, CHROMGRNA in the last 8760 hours.  Assessment and Plan: Patient with past medical history of several prior strokes presents with complaint of imaging findings suggestive of L ICA stenosis.  IR consulted for angiogram at the request of Dr. Posey Pronto. Case reviewed by Dr. Estanislado Pandy who approves patient for procedure.  Patient presents today in their usual state of health.  She has been NPO and is not currently on blood thinners.    Risks and benefits were discussed with the patient including, but not limited to bleeding, infection, vascular injury or contrast induced renal failure.  This interventional procedure involves the use of X-rays and because of the nature of the planned procedure, it is possible that we will have prolonged use of X-ray fluoroscopy.  Potential radiation risks to you include (but are not limited to) the following: - A slightly elevated risk for cancer  several years later in life. This risk is typically less than 0.5% percent. This risk is low in comparison to the normal incidence of human cancer, which is 33% for women and 50% for men according to the [place]. - Radiation induced injury can include skin redness, resembling a rash, tissue breakdown / ulcers and hair loss (which can be temporary or permanent).   The likelihood of either of these occurring depends on the difficulty of the procedure and whether you are sensitive to radiation due to previous procedures, disease, or genetic conditions.   IF your procedure requires a prolonged use of radiation, you will be notified and given written instructions for further action.  It is your responsibility to monitor the irradiated area for the 2 weeks following the procedure and to notify your physician if you are concerned that  you have suffered a radiation induced injury.    All of the patient's questions were answered, patient is agreeable to proceed.  Consent signed and in chart.   Thank you for this interesting consult.  I greatly enjoyed meeting SHERAL SPOELSTRA and look forward to participating in their care.  A copy of  this report was sent to the requesting provider on this date.  Electronically Signed: Docia Barrier, PA 02/07/2019, 10:33 AM   I spent a total of  30 Minutes   in face to face in clinical consultation, greater than 50% of which was counseling/coordinating care for L ICA stenosis.

## 2019-02-08 ENCOUNTER — Telehealth: Payer: Self-pay | Admitting: Neurology

## 2019-02-08 NOTE — Telephone Encounter (Signed)
Patient mother called and wanted [place] to know the patient had the test done yesterday

## 2019-02-11 ENCOUNTER — Encounter (HOSPITAL_COMMUNITY): Payer: Self-pay | Admitting: Interventional Radiology

## 2019-02-13 NOTE — Telephone Encounter (Signed)
Patient's mother is calling in about the cerebral angiogram that patient had last Thursday and is wanting the results. Thanks!

## 2019-02-13 NOTE — Telephone Encounter (Signed)
Please advise, I do not see any results, have you seen anything?

## 2019-02-14 NOTE — Telephone Encounter (Signed)
Please have her contact Dr. Arlean Hopping office for the result and management options.  It looks like patient and patient's father were present for the test and results were discussed with them.

## 2019-02-14 NOTE — Telephone Encounter (Signed)
Mother notified to call Dr.Deveshwar's office.

## 2019-02-15 ENCOUNTER — Encounter: Payer: Self-pay | Admitting: Physician Assistant

## 2019-02-15 ENCOUNTER — Other Ambulatory Visit (HOSPITAL_COMMUNITY): Payer: Self-pay | Admitting: Interventional Radiology

## 2019-02-15 DIAGNOSIS — I771 Stricture of artery: Secondary | ICD-10-CM

## 2019-02-16 ENCOUNTER — Other Ambulatory Visit (HOSPITAL_COMMUNITY)
Admission: RE | Admit: 2019-02-16 | Discharge: 2019-02-16 | Disposition: A | Discharge status: Home / Self Care | Payer: Medicaid Other | Source: Ambulatory Visit | Attending: Interventional Radiology | Admitting: Interventional Radiology

## 2019-02-16 ENCOUNTER — Other Ambulatory Visit (HOSPITAL_COMMUNITY): Payer: Medicaid Other

## 2019-02-16 DIAGNOSIS — Z01812 Encounter for preprocedural laboratory examination: Secondary | ICD-10-CM | POA: Diagnosis present

## 2019-02-16 DIAGNOSIS — Z20828 Contact with and (suspected) exposure to other viral communicable diseases: Secondary | ICD-10-CM | POA: Insufficient documentation

## 2019-02-17 LAB — NOVEL CORONAVIRUS, NAA (HOSP ORDER, SEND-OUT TO REF LAB; TAT 18-24 HRS): SARS-CoV-2, NAA: NOT DETECTED

## 2019-02-19 ENCOUNTER — Other Ambulatory Visit: Payer: Self-pay

## 2019-02-19 ENCOUNTER — Other Ambulatory Visit: Payer: Self-pay | Admitting: Physician Assistant

## 2019-02-19 ENCOUNTER — Encounter (HOSPITAL_COMMUNITY): Payer: Self-pay | Admitting: *Deleted

## 2019-02-19 ENCOUNTER — Telehealth: Payer: Self-pay | Admitting: Student

## 2019-02-19 NOTE — Anesthesia Preprocedure Evaluation (Addendum)
Anesthesia Evaluation  Patient identified by MRN, date of birth, ID band Patient awake    Reviewed: Allergy & Precautions, NPO status , Patient's Chart, lab work & pertinent test results  Airway Mallampati: II  TM Distance: >3 FB Neck ROM: Full    Dental no notable dental hx. (+) Edentulous Upper, Edentulous Lower, Dental Advisory Given   Pulmonary neg pulmonary ROS, former smoker,    Pulmonary exam normal breath sounds clear to auscultation       Cardiovascular hypertension, Normal cardiovascular exam Rhythm:Regular Rate:Normal     Neuro/Psych Seizures -,  Bipolar Disorder CVA, Residual Symptoms    GI/Hepatic negative GI ROS, Neg liver ROS,   Endo/Other  diabetes, Poorly Controlled  Renal/GU negative Renal ROS  negative genitourinary   Musculoskeletal negative musculoskeletal ROS (+)   Abdominal   Peds negative pediatric ROS (+)  Hematology negative hematology ROS (+)   Anesthesia Other Findings   Reproductive/Obstetrics negative OB ROS                           Anesthesia Physical Anesthesia Plan  ASA: IV  Anesthesia Plan: General   Post-op Pain Management:    Induction: Intravenous  PONV Risk Score and Plan: Ondansetron, Dexamethasone and Treatment may vary due to age or medical condition  Airway Management Planned: Oral ETT  Additional Equipment: Arterial line  Intra-op Plan:   Post-operative Plan: Possible Post-op intubation/ventilation  Informed Consent: I have reviewed the patients History and Physical, chart, labs and discussed the procedure including the risks, benefits and alternatives for the proposed anesthesia with the patient or authorized representative who has indicated his/her understanding and acceptance.     Dental advisory given  Plan Discussed with: CRNA  Anesthesia Plan Comments: (Cerebrovascular disease with left thalamic infarct (7/20) and left  paramedian pontine stroke (11/2018) with residual dysarthria, mild dysphagia, and right sided painful paresthesias.  Most likely etiology is intracranial stenosis which is seen on her imaging. Echocardiogram with bubble and cardiac event monitor was normal.  History of right PCA and bilateral thalamic stroke, 2013.  Stroke mechanism thought to be due to P2 stenosis and hypercoagulable state due to sepsis as this occurred in the setting of prolonged hospitalization in 2013 with benzodiazepine overdose, GI bleed, DKA, and sepsis - had a complicated hospital course including prolonged VDRF requiring tracheostomy and acute renal failure requiring hemodialysis (Renal failure resolved, trach was decanulated).  Event monitor 12/13/18: Predominantly sinus rhythm. No arrhythmias.  TTE 12/03/18:  1. The left ventricle has a visually estimated ejection fraction of 55%. The cavity size was normal. There is mild concentric left ventricular hypertrophy. Diastolic dysfunction, grade indeterminate. Elevated left ventricular end-diastolic pressure.  2. The right ventricle has normal systolic function. The cavity was normal. There is no increase in right ventricular wall thickness.  3. The mitral valve is grossly normal. There is mild mitral annular calcification present.  4. The tricuspid valve is grossly normal.  5. The aortic valve is tricuspid.  6. The aortic root is normal in size and structure.  7. No atrial level shunt detected by color flow Doppler. Agitated saline contrast was given intravenously to evaluate for intracardiac shunting. Saline contrast bubble study was negative, with no evidence of any interatrial shunt.  )       Anesthesia Quick Evaluation

## 2019-02-19 NOTE — Progress Notes (Addendum)
I spoke to Ms Boedeker mother, Terre Lesar. Lattie Haw reports that patient has not had any chest pain and she has shortness of breath when walking all the time. 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 Drinda Hasberry has type II diabetes, she does not check CBGs on a regular basis.  I asked if patient had taken Wilder Glade this am and she had, I instructed Lattie Haw to not give any medication for diabetes in am, I instructed to have patient  check CBG after awaking and every 2 hours until arrival  to the hospital.  I Instructed patient if CBG is less than 70 to drink  1/2 cup of a clear juice. Recheck CBG in 15 minutes then call pre- op desk at [phone] for further instructions. Leta Jungling asked if she can spend the night with patient due to her short- term memory issues, I will send information to the unit leadership, on the unit she will be on.  I called [facility] and asked to speak with someone in leadership on the unit, I was transferred to [facility], who was the charge nurse, she said it should be ok, that it has been done in the past.

## 2019-02-19 NOTE — Telephone Encounter (Signed)
NIR.  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 Estanislado Pandy. 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  [employer] [phone]) at 1538 to fill pre-medication regimen for contrast allergy: 1- Prednisone 50 mg tablets; take one tablet by mouth 13 hours, 7 hours, and 1 hour prior to procedure 02/07/2019; dispense 3 tablets with 0 refills. 2- Benadryl 50 mg tablets; take one tablet by mouth 1 hour prior to procedure 02/07/2019; dispense 1 tablet with 0 refills.   Bea Graff Adhvik Canady, PA-C 02/19/2019, 3:49 PM

## 2019-02-20 ENCOUNTER — Encounter (HOSPITAL_COMMUNITY): Payer: Self-pay | Admitting: Certified Registered Nurse Anesthetist

## 2019-02-20 ENCOUNTER — Ambulatory Visit (HOSPITAL_COMMUNITY): Payer: Medicaid Other | Admitting: Physician Assistant

## 2019-02-20 ENCOUNTER — Ambulatory Visit (HOSPITAL_COMMUNITY)
Admission: RE | Admit: 2019-02-20 | Discharge: 2019-02-20 | Disposition: A | Discharge status: Home / Self Care | Payer: Medicaid Other | Source: Ambulatory Visit | Attending: Interventional Radiology | Admitting: Interventional Radiology

## 2019-02-20 ENCOUNTER — Ambulatory Visit (HOSPITAL_BASED_OUTPATIENT_CLINIC_OR_DEPARTMENT_OTHER): Payer: Medicaid Other

## 2019-02-20 ENCOUNTER — Encounter (HOSPITAL_COMMUNITY)
Admission: RE | Disposition: A | Discharge status: Home / Self Care | Payer: Self-pay | Source: Home / Self Care | Attending: Interventional Radiology

## 2019-02-20 ENCOUNTER — Other Ambulatory Visit: Payer: Self-pay

## 2019-02-20 ENCOUNTER — Observation Stay (HOSPITAL_COMMUNITY)
Admission: RE | Admit: 2019-02-20 | Discharge: 2019-02-21 | Disposition: A | Discharge status: Home / Self Care | Payer: Medicaid Other | Attending: Interventional Radiology | Admitting: Interventional Radiology

## 2019-02-20 DIAGNOSIS — I724 Aneurysm of artery of lower extremity: Secondary | ICD-10-CM

## 2019-02-20 DIAGNOSIS — E119 Type 2 diabetes mellitus without complications: Secondary | ICD-10-CM | POA: Insufficient documentation

## 2019-02-20 DIAGNOSIS — I10 Essential (primary) hypertension: Secondary | ICD-10-CM | POA: Diagnosis not present

## 2019-02-20 DIAGNOSIS — Z8673 Personal history of transient ischemic attack (TIA), and cerebral infarction without residual deficits: Secondary | ICD-10-CM | POA: Insufficient documentation

## 2019-02-20 DIAGNOSIS — Q2732 Arteriovenous malformation of vessel of lower limb: Secondary | ICD-10-CM | POA: Insufficient documentation

## 2019-02-20 DIAGNOSIS — Z7984 Long term (current) use of oral hypoglycemic drugs: Secondary | ICD-10-CM | POA: Insufficient documentation

## 2019-02-20 DIAGNOSIS — G450 Vertebro-basilar artery syndrome: Secondary | ICD-10-CM | POA: Diagnosis present

## 2019-02-20 DIAGNOSIS — Z7982 Long term (current) use of aspirin: Secondary | ICD-10-CM | POA: Diagnosis not present

## 2019-02-20 DIAGNOSIS — Z79899 Other long term (current) drug therapy: Secondary | ICD-10-CM | POA: Insufficient documentation

## 2019-02-20 DIAGNOSIS — F319 Bipolar disorder, unspecified: Secondary | ICD-10-CM | POA: Insufficient documentation

## 2019-02-20 DIAGNOSIS — I771 Stricture of artery: Secondary | ICD-10-CM

## 2019-02-20 DIAGNOSIS — Z87891 Personal history of nicotine dependence: Secondary | ICD-10-CM | POA: Insufficient documentation

## 2019-02-20 DIAGNOSIS — I6502 Occlusion and stenosis of left vertebral artery: Secondary | ICD-10-CM | POA: Diagnosis not present

## 2019-02-20 DIAGNOSIS — Z79890 Hormone replacement therapy (postmenopausal): Secondary | ICD-10-CM | POA: Insufficient documentation

## 2019-02-20 HISTORY — PX: RADIOLOGY WITH ANESTHESIA: SHX6223

## 2019-02-20 HISTORY — DX: Dyspnea, unspecified: R06.00

## 2019-02-20 HISTORY — DX: Pneumonia, unspecified organism: J18.9

## 2019-02-20 HISTORY — PX: IR TRANSCATH EXCRAN VERT OR CAR A STENT: IMG1955

## 2019-02-20 HISTORY — PX: IR ANGIOGRAM EXTREMITY RIGHT: IMG652

## 2019-02-20 LAB — BASIC METABOLIC PANEL
Anion gap: 13 (ref 5–15)
BUN: 21 mg/dL — ABNORMAL HIGH (ref 6–20)
CO2: 18 mmol/L — ABNORMAL LOW (ref 22–32)
Calcium: 9.8 mg/dL (ref 8.9–10.3)
Chloride: 104 mmol/L (ref 98–111)
Creatinine, Ser: 0.97 mg/dL (ref 0.44–1.00)
GFR calc Af Amer: >60 mL/min (ref >60)
GFR calc non Af Amer: >60 mL/min (ref >60)
Glucose, Bld: 319 mg/dL — ABNORMAL HIGH (ref 70–99)
Potassium: 4.6 mmol/L (ref 3.5–5.1)
Sodium: 135 mmol/L (ref 135–145)

## 2019-02-20 LAB — CBC WITH DIFFERENTIAL/PLATELET
Abs Immature Granulocytes: 0.09 10*3/uL — ABNORMAL HIGH (ref 0.00–0.07)
Basophils Absolute: 0 10*3/uL (ref 0.0–0.1)
Basophils Relative: 0 %
Eosinophils Absolute: 0 10*3/uL (ref 0.0–0.5)
Eosinophils Relative: 0 %
HCT: 39 % (ref 36.0–46.0)
Hemoglobin: 11.8 g/dL — ABNORMAL LOW (ref 12.0–15.0)
Immature Granulocytes: 1 %
Lymphocytes Relative: 7 %
Lymphs Abs: 0.9 10*3/uL (ref 0.7–4.0)
MCH: 27 pg (ref 26.0–34.0)
MCHC: 30.3 g/dL (ref 30.0–36.0)
MCV: 89.2 fL (ref 80.0–100.0)
Monocytes Absolute: 0.1 10*3/uL (ref 0.1–1.0)
Monocytes Relative: 1 %
Neutro Abs: 11.4 10*3/uL — ABNORMAL HIGH (ref 1.7–7.7)
Neutrophils Relative %: 91 %
Platelets: 223 10*3/uL (ref 150–400)
RBC: 4.37 MIL/uL (ref 3.87–5.11)
RDW: 14.3 % (ref 11.5–15.5)
WBC: 12.5 10*3/uL — ABNORMAL HIGH (ref 4.0–10.5)
nRBC: 0 % (ref 0.0–0.2)

## 2019-02-20 LAB — URINALYSIS, COMPLETE (UACMP) WITH MICROSCOPIC
Bilirubin Urine: NEGATIVE
Glucose, UA: >=500 mg/dL — AB
Ketones, ur: 5 mg/dL — AB
Leukocytes,Ua: NEGATIVE
Nitrite: NEGATIVE
Protein, ur: NEGATIVE mg/dL
Specific Gravity, Urine: 1.031 — ABNORMAL HIGH (ref 1.005–1.030)
pH: 6 (ref 5.0–8.0)

## 2019-02-20 LAB — APTT: aPTT: 24 seconds (ref 24–36)

## 2019-02-20 LAB — GLUCOSE, CAPILLARY
Glucose-Capillary: 315 mg/dL — ABNORMAL HIGH (ref 70–99)
Glucose-Capillary: 297 mg/dL — ABNORMAL HIGH (ref 70–99)
Glucose-Capillary: 221 mg/dL — ABNORMAL HIGH (ref 70–99)
Glucose-Capillary: 226 mg/dL — ABNORMAL HIGH (ref 70–99)

## 2019-02-20 LAB — POCT ACTIVATED CLOTTING TIME
Activated Clotting Time: 164 seconds
Activated Clotting Time: 109 seconds
Activated Clotting Time: 120 seconds

## 2019-02-20 LAB — PROTIME-INR
INR: 1 (ref 0.8–1.2)
Prothrombin Time: 13.4 seconds (ref 11.4–15.2)

## 2019-02-20 LAB — PLATELET INHIBITION P2Y12: Platelet Function  P2Y12: 164 [PRU] — ABNORMAL LOW (ref 182–335)

## 2019-02-20 LAB — POCT PREGNANCY, URINE: Preg Test, Ur: NEGATIVE

## 2019-02-20 SURGERY — RADIOLOGY WITH ANESTHESIA
Anesthesia: General

## 2019-02-20 MED ORDER — CEFAZOLIN SODIUM-DEXTROSE 2-3 GM-%(50ML) IV SOLR
INTRAVENOUS | Status: DC | PRN
  Administered 2019-02-20: 2 g via INTRAVENOUS

## 2019-02-20 MED ORDER — SODIUM CHLORIDE 0.9 % IV SOLN
INTRAVENOUS | Status: DC | PRN
  Administered 2019-02-20: 40 ug/min via INTRAVENOUS

## 2019-02-20 MED ORDER — SODIUM CHLORIDE 0.9 % IV SOLN
INTRAVENOUS | Status: DC
  Administered 2019-02-20: 23:00:00 via INTRAVENOUS

## 2019-02-20 MED ORDER — INSULIN ASPART 100 UNIT/ML ~~LOC~~ SOLN
10.0000 [IU] | Freq: Once | SUBCUTANEOUS | Status: AC
  Administered 2019-02-20: 08:00:00 10 [IU] via SUBCUTANEOUS
  Filled 2019-02-20: qty 0.1

## 2019-02-20 MED ORDER — LISINOPRIL 40 MG PO TABS
40.0000 mg | ORAL_TABLET | Freq: Every day | ORAL | Status: DC
  Administered 2019-02-21: 09:00:00 40 mg via ORAL
  Filled 2019-02-20: qty 1

## 2019-02-20 MED ORDER — FENTANYL CITRATE (PF) 100 MCG/2ML IJ SOLN: 25.0000 ug | INTRAMUSCULAR | Status: DC | PRN

## 2019-02-20 MED ORDER — ATORVASTATIN CALCIUM 80 MG PO TABS
80.0000 mg | ORAL_TABLET | Freq: Every day | ORAL | Status: DC
  Administered 2019-02-20: 23:00:00 80 mg via ORAL
  Filled 2019-02-20: qty 1

## 2019-02-20 MED ORDER — LIDOCAINE 2% (20 MG/ML) 5 ML SYRINGE
INTRAMUSCULAR | Status: DC | PRN
  Administered 2019-02-20: 100 mg via INTRAVENOUS

## 2019-02-20 MED ORDER — INSULIN ASPART 100 UNIT/ML ~~LOC~~ SOLN
5.0000 [IU] | Freq: Once | SUBCUTANEOUS | Status: AC
  Administered 2019-02-20: 5 [IU] via SUBCUTANEOUS

## 2019-02-20 MED ORDER — DIAZEPAM 5 MG PO TABS
5.0000 mg | ORAL_TABLET | Freq: Two times a day (BID) | ORAL | Status: DC
  Administered 2019-02-20 – 2019-02-21 (×2): 5 mg via ORAL
  Filled 2019-02-20 (×2): qty 1

## 2019-02-20 MED ORDER — IBUPROFEN 600 MG PO TABS
600.0000 mg | ORAL_TABLET | Freq: Two times a day (BID) | ORAL | Status: DC
  Administered 2019-02-20 – 2019-02-21 (×2): 600 mg via ORAL
  Filled 2019-02-20 (×2): qty 1

## 2019-02-20 MED ORDER — ONDANSETRON HCL 4 MG/2ML IJ SOLN
INTRAMUSCULAR | Status: DC | PRN
  Administered 2019-02-20: 4 mg via INTRAVENOUS

## 2019-02-20 MED ORDER — ROCURONIUM BROMIDE 10 MG/ML (PF) SYRINGE
PREFILLED_SYRINGE | INTRAVENOUS | Status: DC | PRN
  Administered 2019-02-20 (×4): 20 mg via INTRAVENOUS

## 2019-02-20 MED ORDER — DEXAMETHASONE SODIUM PHOSPHATE 10 MG/ML IJ SOLN
INTRAMUSCULAR | Status: DC | PRN
  Administered 2019-02-20: 10 mg via INTRAVENOUS

## 2019-02-20 MED ORDER — IOHEXOL 300 MG/ML  SOLN
150.0000 mL | Freq: Once | INTRAMUSCULAR | Status: AC | PRN
  Administered 2019-02-20: 60 mL via INTRA_ARTERIAL

## 2019-02-20 MED ORDER — SUCCINYLCHOLINE CHLORIDE 200 MG/10ML IV SOSY
PREFILLED_SYRINGE | INTRAVENOUS | Status: DC | PRN
  Administered 2019-02-20: 120 mg via INTRAVENOUS

## 2019-02-20 MED ORDER — ASPIRIN EC 325 MG PO TBEC
325.0000 mg | DELAYED_RELEASE_TABLET | Freq: Every day | ORAL | Status: DC
  Administered 2019-02-21: 09:00:00 325 mg via ORAL
  Filled 2019-02-20: qty 1

## 2019-02-20 MED ORDER — GABAPENTIN 300 MG PO CAPS
900.0000 mg | ORAL_CAPSULE | Freq: Two times a day (BID) | ORAL | Status: DC
  Administered 2019-02-21 (×2): 900 mg via ORAL
  Filled 2019-02-20 (×2): qty 3

## 2019-02-20 MED ORDER — MIDAZOLAM HCL 5 MG/5ML IJ SOLN
INTRAMUSCULAR | Status: DC | PRN
  Administered 2019-02-20: 2 mg via INTRAVENOUS

## 2019-02-20 MED ORDER — INSULIN ASPART 100 UNIT/ML ~~LOC~~ SOLN
SUBCUTANEOUS | Status: AC
  Filled 2019-02-20: qty 1

## 2019-02-20 MED ORDER — FENTANYL CITRATE (PF) 250 MCG/5ML IJ SOLN
INTRAMUSCULAR | Status: DC | PRN
  Administered 2019-02-20: 25 ug via INTRAVENOUS
  Administered 2019-02-20: 50 ug via INTRAVENOUS
  Administered 2019-02-20 (×2): 25 ug via INTRAVENOUS
  Administered 2019-02-20: 50 ug via INTRAVENOUS

## 2019-02-20 MED ORDER — NITROGLYCERIN 1 MG/10 ML FOR IR/CATH LAB
INTRA_ARTERIAL | Status: AC
  Filled 2019-02-20: qty 10

## 2019-02-20 MED ORDER — LEVETIRACETAM ER 500 MG PO TB24
1000.0000 mg | ORAL_TABLET | Freq: Every day | ORAL | Status: DC
  Administered 2019-02-21: 1000 mg via ORAL
  Filled 2019-02-20: qty 2

## 2019-02-20 MED ORDER — LACTATED RINGERS IV SOLN
INTRAVENOUS | Status: DC | PRN
  Administered 2019-02-20 (×2): via INTRAVENOUS

## 2019-02-20 MED ORDER — PROMETHAZINE HCL 25 MG/ML IJ SOLN: 6.2500 mg | INTRAMUSCULAR | Status: DC | PRN

## 2019-02-20 MED ORDER — PROTAMINE SULFATE 10 MG/ML IV SOLN
INTRAVENOUS | Status: DC | PRN
  Administered 2019-02-20: 5 mg via INTRAVENOUS

## 2019-02-20 MED ORDER — LABETALOL HCL 300 MG PO TABS
300.0000 mg | ORAL_TABLET | Freq: Two times a day (BID) | ORAL | Status: DC
  Administered 2019-02-20 – 2019-02-21 (×2): 300 mg via ORAL
  Filled 2019-02-20 (×2): qty 1

## 2019-02-20 MED ORDER — CLOPIDOGREL BISULFATE 75 MG PO TABS
75.0000 mg | ORAL_TABLET | Freq: Every day | ORAL | Status: DC
  Administered 2019-02-21: 09:00:00 75 mg via ORAL
  Filled 2019-02-20: qty 1

## 2019-02-20 MED ORDER — HEPARIN SODIUM (PORCINE) 1000 UNIT/ML IJ SOLN
INTRAMUSCULAR | Status: DC | PRN
  Administered 2019-02-20: 3000 [IU] via INTRAVENOUS

## 2019-02-20 MED ORDER — TRAZODONE HCL 100 MG PO TABS
200.0000 mg | ORAL_TABLET | Freq: Every day | ORAL | Status: DC
  Administered 2019-02-20: 200 mg via ORAL
  Filled 2019-02-20: qty 2

## 2019-02-20 MED ORDER — CLOPIDOGREL BISULFATE 75 MG PO TABS
75.0000 mg | ORAL_TABLET | Freq: Once | ORAL | Status: AC
  Administered 2019-02-20: 09:00:00 75 mg via ORAL

## 2019-02-20 MED ORDER — PHENYLEPHRINE 40 MCG/ML (10ML) SYRINGE FOR IV PUSH (FOR BLOOD PRESSURE SUPPORT)
PREFILLED_SYRINGE | INTRAVENOUS | Status: DC | PRN
  Administered 2019-02-20 (×3): 80 ug via INTRAVENOUS

## 2019-02-20 MED ORDER — CEFAZOLIN SODIUM-DEXTROSE 2-4 GM/100ML-% IV SOLN
INTRAVENOUS | Status: AC
  Filled 2019-02-20: qty 100

## 2019-02-20 MED ORDER — CANAGLIFLOZIN 100 MG PO TABS
100.0000 mg | ORAL_TABLET | Freq: Every day | ORAL | Status: DC
  Administered 2019-02-21: 09:00:00 100 mg via ORAL
  Filled 2019-02-20 (×2): qty 1

## 2019-02-20 MED ORDER — CLOPIDOGREL BISULFATE 75 MG PO TABS
ORAL_TABLET | ORAL | Status: AC
  Filled 2019-02-20: qty 1

## 2019-02-20 MED ORDER — GABAPENTIN 600 MG PO TABS
1200.0000 mg | ORAL_TABLET | Freq: Every day | ORAL | Status: DC
  Administered 2019-02-20: 23:00:00 1200 mg via ORAL
  Filled 2019-02-20: qty 2

## 2019-02-20 MED ORDER — SODIUM CHLORIDE 0.9 % IV SOLN: INTRAVENOUS | Status: DC

## 2019-02-20 MED ORDER — GABAPENTIN 300 MG PO CAPS: 900.0000 mg | ORAL_CAPSULE | ORAL | Status: DC

## 2019-02-20 MED ORDER — ACETAMINOPHEN 325 MG PO TABS
650.0000 mg | ORAL_TABLET | Freq: Four times a day (QID) | ORAL | Status: DC | PRN
  Administered 2019-02-21 (×3): 650 mg via ORAL
  Filled 2019-02-20 (×3): qty 2

## 2019-02-20 MED ORDER — PROPOFOL 10 MG/ML IV BOLUS
INTRAVENOUS | Status: DC | PRN
  Administered 2019-02-20: 200 mg via INTRAVENOUS

## 2019-02-20 MED ORDER — DULOXETINE HCL 30 MG PO CPEP
30.0000 mg | ORAL_CAPSULE | Freq: Every day | ORAL | Status: DC
  Administered 2019-02-21: 30 mg via ORAL
  Filled 2019-02-20: qty 1

## 2019-02-20 MED ORDER — EPTIFIBATIDE 20 MG/10ML IV SOLN
INTRAVENOUS | Status: AC
  Filled 2019-02-20: qty 10

## 2019-02-20 MED ORDER — PANTOPRAZOLE SODIUM 40 MG PO TBEC
40.0000 mg | DELAYED_RELEASE_TABLET | Freq: Every day | ORAL | Status: DC
  Administered 2019-02-21: 40 mg via ORAL
  Filled 2019-02-20: qty 1

## 2019-02-20 MED ORDER — SUGAMMADEX SODIUM 200 MG/2ML IV SOLN
INTRAVENOUS | Status: DC | PRN
  Administered 2019-02-20: 200 mg via INTRAVENOUS

## 2019-02-20 MED ORDER — RISPERIDONE 0.5 MG PO TABS
0.5000 mg | ORAL_TABLET | Freq: Every day | ORAL | Status: DC
  Administered 2019-02-20: 23:00:00 0.5 mg via ORAL
  Filled 2019-02-20 (×2): qty 1

## 2019-02-20 NOTE — Anesthesia Postprocedure Evaluation (Signed)
Anesthesia Post Note  Patient: TENEE MARCHANT  Procedure(s) Performed: RADIOLOGY WITH ANESTHESIA   STENTING (N/A )     Patient location during evaluation: PACU Anesthesia Type: General Level of consciousness: awake and alert Pain management: pain level controlled Vital Signs Assessment: post-procedure vital signs reviewed and stable Respiratory status: spontaneous breathing, nonlabored ventilation, respiratory function stable and patient connected to nasal cannula oxygen Cardiovascular status: blood pressure returned to baseline and stable Postop Assessment: no apparent nausea or vomiting Anesthetic complications: no    Last Vitals:  Vitals:   02/20/19 1340 02/20/19 1350  BP: 102/64 99/68  Pulse: 91 93  Resp: 17 18  Temp:    SpO2: 99% 99%    Last Pain:  Vitals:   02/20/19 1350  PainSc: Asleep                 Deunte Bledsoe S

## 2019-02-20 NOTE — Procedures (Signed)
S/P RT VA angiogram  RT CFA approach. Finding. 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 S.Laronda Lisby MD

## 2019-02-20 NOTE — Transfer of Care (Signed)
Immediate Anesthesia Transfer of Care Note  Patient: Taylor [name]  Procedure(s) Performed: RADIOLOGY WITH ANESTHESIA   STENTING (N/A )  Patient Location: PACU  Anesthesia Type:General  Level of Consciousness: awake, alert  and oriented  Airway & Oxygen Therapy: Patient Spontanous Breathing and Patient connected to nasal cannula oxygen  Post-op Assessment: Report given to RN, Post -op Vital signs reviewed and stable and Patient moving all extremities  Post vital signs: Reviewed and stable  Last Vitals:  Vitals Value Taken Time  BP 102/68 02/20/19 1321  Temp    Pulse 92 02/20/19 1326  Resp 19 02/20/19 1326  SpO2 98 % 02/20/19 1326  Vitals shown include unvalidated device data.  Last Pain:  Vitals:   02/20/19 0653  PainSc: 6       Patients Stated Pain Goal: 2 ([id number])  Complications: No apparent anesthesia complications

## 2019-02-20 NOTE — H&P (Signed)
Chief Complaint: Patient was seen in consultation today for cerebral arteriogram at the request of Dr Narda Amber   Supervising Physician: Luanne Bras  Patient Status: [facility] - Out-pt  History of Present Illness: Taylor [name] is a 38 y.o. female   Dr Narda Amber note 01/18/19: History of Present Illness:    Pt with known intracranial cerebrovascular disease and most recent angiogram finds severe left vertebral stenosis that requires treatment.  She is now scheduled now for cerebral arteriogram with plan to treat left vert stenosis.  Has taken her contrast allergy pre-meds as directed. PMHx, meds, labs, imaging, allergies reviewed. Feels well, no recent fevers, chills, illness. Has been NPO today as directed. Family at bedside.    Past Medical History:  Diagnosis Date   Anxiety    ARF (acute renal failure) ([place]) 2013   Bipolar disorder ([place])    Cerebral artery occlusion with cerebral infarction ([place]) 11/13   due to elevated blood sugar 1700-off insulin 6 months   Depression    Diabetes mellitus    off insulin 6 months   Dyspnea    when walking   Headache(784.0)    Hypertension    Kidney dialysis status    on dialysis(stroke) off after 4-6 weeks   Pneumonia 2013, 06/2018   Respiratory failure ([place]) 11/13   with stroke - in hosp [facility] 12 weeks   Seizures ([place])    during elvated blood sugsr episode 11/13   Sepsis ([place])    Stroke ([place])    11/2018, 12/2018 Weak left side, speech- slurred, Short term memeroy, Gait unsteady    Vocal cord dysfunction     Past Surgical History:  Procedure Laterality Date   APPENDECTOMY     INSERTION OF DIALYSIS CATHETER  11/13   removed in 4-6 weeks   IR ANGIO INTRA EXTRACRAN SEL COM CAROTID INNOMINATE BILAT MOD SED  02/07/2019   IR ANGIO VERTEBRAL SEL VERTEBRAL BILAT MOD SED  02/07/2019   IR ANGIOGRAM EXTREMITY LEFT  02/07/2019   IR US GUIDE VASC ACCESS RIGHT  02/07/2019   MULTIPLE EXTRACTIONS WITH  ALVEOLOPLASTY N/A 02/24/2014   Procedure: MULTIPLE EXTRACTION WITH ALVEOLOPLASTY AND BIOPSY;  Surgeon: Gae Bon, DDS;  Location: [place];  Service: Oral Surgery;  Laterality: N/A;   TRACHEOSTOMY  11/13   closed 1/14   TRANSRECTAL DRAINAGE OF PELVIC ABSCESS  12/11    Allergies: Contrast media [iodinated diagnostic agents], Amitriptyline, Compazine, Oxycodone, Prochlorperazine, Promethazine, and Sumatriptan  Medications: Prior to Admission medications   Medication Sig Start Date End Date Taking? Authorizing Provider  acetaminophen (TYLENOL) 325 MG tablet Take 650 mg by mouth every 6 (six) hours as needed. pain   Yes [provider]  aspirin EC 325 MG tablet Take 325 mg by mouth daily.    Yes [provider]  atorvastatin (LIPITOR) 40 MG tablet Take 40 mg by mouth daily.   Yes [provider]  clopidogrel (PLAVIX) 75 MG tablet Take 1 tablet (75 mg total) by mouth daily. 11/26/18  Yes Patel, Donika K, DO  dapagliflozin propanediol (FARXIGA) 10 MG TABS tablet Take 10 mg by mouth every morning.    Yes [provider]  diazepam (VALIUM) 5 MG tablet Take 5 mg by mouth 2 (two) times daily.    Yes [provider]  DULoxetine (CYMBALTA) 30 MG capsule Take 30 mg by mouth daily.   Yes [provider]  gabapentin (NEURONTIN) 300 MG capsule Take 3 tablets in the morning, 3 tab  in the afternoon, and 4 tablets at bedtime. Patient taking differently: Take 900-1,200 mg by mouth See admin instructions. Take 3 tablets in the morning, 3 tab in the afternoon, and 4 tablets at bedtime. 11/26/18  Yes Patel, Donika K, DO  ibuprofen (ADVIL) 600 MG tablet Take 600 mg by mouth 2 (two) times daily.  11/16/18  Yes [provider]  labetalol (NORMODYNE) 300 MG tablet Take 300 mg by mouth 2 (two) times daily.   Yes [provider]  levETIRAcetam (KEPPRA XR) 500 MG 24 hr tablet Take 1,000 mg by mouth daily.   Yes [provider]  lisinopril  (ZESTRIL) 40 MG tablet Take 40 mg by mouth daily.    Yes [provider]  metFORMIN (GLUCOPHAGE) 500 MG tablet Take 500 mg by mouth 2 (two) times daily. 11/16/18  Yes [provider]  omeprazole (PRILOSEC) 20 MG capsule Take 20 mg by mouth daily.   Yes [provider]  risperiDONE (RISPERDAL) 0.5 MG tablet Take 0.5 mg by mouth at bedtime.   Yes [provider]  traZODone (DESYREL) 100 MG tablet Take 200 mg by mouth at bedtime.    Yes [provider]  atorvastatin (LIPITOR) 80 MG tablet Take 1 tablet (80 mg total) by mouth daily. Patient not taking: Reported on 02/04/2019 01/24/19   Alda Berthold, DO     Family History  Problem Relation Age of Onset   Lung cancer Maternal Grandmother    Heart attack Maternal Grandfather    Diabetes Paternal Grandfather     Social History   Socioeconomic History   Marital status: Single    Spouse name: Not on file   Number of children: 0   Years of education: 14   Highest education level: Not on file  Occupational History   Not on file  Social Needs   Financial resource strain: Not on file   Food insecurity    Worry: Not on file    Inability: Not on file   Transportation needs    Medical: Not on file    Non-medical: Not on file  Tobacco Use   Smoking status: Former Smoker    Packs/day: 0.60    Years: 23.00    Pack years: 13.80    Types: Cigarettes    Quit date: 02/05/2019    Years since quitting: 0.0   Smokeless tobacco: Never Used  Substance and Sexual Activity   Alcohol use: No    Alcohol/week: 0.0 standard drinks   Drug use: No   Sexual activity: Not Currently    Birth control/protection: None  Lifestyle   Physical activity    Days per week: Not on file    Minutes per session: Not on file   Stress: Not on file  Relationships   Social connections    Talks on phone: Not on file    Gets together: Not on file    Attends religious service: Not on file    Active member  of club or organization: Not on file    Attends meetings of clubs or organizations: Not on file    Relationship status: Not on file  Other Topics Concern   Not on file  Social History Narrative   She is single and does not have any children.    She has 2 yrs of college level education.   She has 5-6 caffeine drinks daily.    Left handed      Living with parents.    Review of  Systems: A 12 point ROS discussed and pertinent positives are indicated in the HPI above.  All other systems are negative.  Review of Systems  Constitutional: Positive for activity change. Negative for fatigue and fever.  HENT: Negative for tinnitus and trouble swallowing.   Eyes: Negative for visual disturbance.  Respiratory: Negative for cough and shortness of breath.   Cardiovascular: Negative for chest pain.  Gastrointestinal: Negative for abdominal pain.  Neurological: Positive for speech difficulty and numbness. Negative for dizziness, tremors, seizures, syncope, facial asymmetry, weakness, light-headedness and headaches.  Psychiatric/Behavioral: Positive for decreased concentration. Negative for behavioral problems.    Vital Signs: BP 119/65    Pulse 87    Temp 97.9 F (36.6 C)    Resp 20    LMP 02/20/2019    SpO2 97%   Physical Exam Vitals signs reviewed.  HENT:     Head: Atraumatic.  Eyes:     Extraocular Movements: Extraocular movements intact.  Neck:     Musculoskeletal: Normal range of motion.  Cardiovascular:     Rate and Rhythm: Normal rate and regular rhythm.     Heart sounds: Normal heart sounds.  Pulmonary:     Effort: Pulmonary effort is normal.     Breath sounds: Normal breath sounds.  Abdominal:     Palpations: Abdomen is soft.  Musculoskeletal: Normal range of motion.  Skin:    General: Skin is warm and dry.  Neurological:     Mental Status: She is alert and oriented to person, place, and time.  Psychiatric:     Comments: Pt has word finding deficit Speech is minimally  slurred Pleasant  Consented with Mother at bedside     Imaging: Ir Angiogram Extremity Left  Result Date: 02/11/2019 CLINICAL DATA:  History of vertebrobasilar ischemic strokes. Abnormal MRA MRI of the brain. EXAM: IR ANGIO VERTEBRAL SEL VERTEBRAL BILAT MOD SED; BILATERAL COMMON CAROTID AND INNOMINATE ANGIOGRAPHY; IR ULTRASOUND GUIDANCE VASC ACCESS RIGHT; LEFT EXTREMITY ARTERIOGRAPHY COMPARISON:  MRI MRA of the brain of 01/08/2019. MEDICATIONS: Heparin 2000 units IA;Marland Kitchen No antibiotic was administered within 1 hour of the procedure. ANESTHESIA/SEDATION: Versed 1 mg IV; Fentanyl 25 mcg IV.  Benadryl 25 mg IV. Moderate Sedation Time:  50 minutes The patient was continuously monitored during the procedure by the interventional radiology nurse under my direct supervision. CONTRAST:  Isovue 300 approximately 60 mL. FLUOROSCOPY TIME:  Fluoroscopy Time: 17 minutes 6 seconds (1150 mGy). COMPLICATIONS: None immediate. TECHNIQUE: Informed written consent was obtained from the patient after a thorough discussion of the procedural risks, benefits and alternatives. All questions were addressed. Maximal Sterile Barrier Technique was utilized including caps, mask, sterile gowns, sterile gloves, sterile drape, hand hygiene and skin antiseptic. A timeout was performed prior to the initiation of the procedure. The right forearm to the wrist was then prepped and draped in the usual sterile fashion. The radial artery was palpated and evaluated with ultrasound in terms of morphology. This was then documented by ultrasound imaging. Thereafter, using ultrasound guidance, a micropuncture needle was advanced into the right radial artery without difficulty. This was then exchanged over a 0.018 inch micro guidewire for a 4/5 French radial sheath. The obturator and the wire were then removed. Good aspiration was obtained from the side port of the sheath. A cocktail of 2.5 mg of verapamil, 2000 units of heparin, and 200 mcg of  nitroglycerin was then injected in a diluted form without any difficulty. A right radial artery angiogram was then obtained through the radial  sheath. Over a 0.035 inch Roadrunner guidewire, a 5 [place] Simmons 2 diagnostic catheter was then advanced to the right subclavian artery and then the aortic arch. This was then performed in the usual manner. Selective cannulation was then performed of the right vertebral artery, the right common carotid artery, the left common carotid artery and the left vertebral artery, and the subclavian artery. Following the procedure, the diagnostic catheter was removed. The sheath was also removed with the successful application of a right radial wrist band for hemostasis. The distal radial pulse was verified. FINDINGS: The origin of the right vertebral artery is widely patent. The vessel is seen to opacify to the cranial skull base. The right vertebrobasilar junction and the right posterior-inferior cerebellar artery demonstrate wide patency. The opacified basilar artery, the left posterior cerebral artery, the superior cerebellar arteries and the anterior-inferior cerebellar arteries demonstrate wide patency into the capillary and venous phases. Mild arteriosclerotic changes are seen involving the mid to distal basilar artery. There appears to be tapered narrowing with occlusion of the right posterior cerebral artery P1 segment. Unopacified blood is seen in the basilar artery from the contralateral vertebral artery. The right common carotid arteriogram demonstrates the right external carotid artery and its major branches to be widely patent. The right internal carotid artery at the bulb to the cranial skull base demonstrates wide patency. The petrous and the cavernous segments are widely patent. There is approximately 50% stenosis of the caval cavernous segment of the right internal carotid artery and to a lesser degree the supraclinoid right ICA. The right middle cerebral artery and  the right anterior cerebral artery demonstrate opacification into the capillary and venous phases. There is mild tapered stenosis of the proximal superior division of the right middle cerebral artery. Transient cross-filling via the anterior communicating artery of the left anterior cerebral A2 segment and distally is noted. The dominant left vertebral artery origin is from the aortic arch between the origins of the left common carotid artery and the left subclavian arteries. There is a severe high-grade stenosis at the origin of the left vertebral artery. Antegrade flow is seen in the distal left vertebral artery to the cranial skull base. Approximately 50% stenosis is seen of the vertebrobasilar junction proximal to the origin of the left posteroinferior cerebellar artery which itself demonstrates a moderate stenosis at its origin. Distal to this the opacified portion of the basilar artery, the left posterior cerebral arteries, the superior cerebellar arteries and the anterior-inferior cerebellar arteries is seen into the capillary and venous phases. Unopacified blood is seen in the basilar artery from the contralateral vertebral artery. Again demonstrated is a tapered narrowing of the right posterior cerebral P1 segment to complete occlusion. The left common carotid arteriogram demonstrates the left external carotid artery and its major branches to be widely patent. The left internal carotid artery at the bulb has approximately 50% stenosis associated with an ulcerated plaque. No intraluminal filling defects are seen. More distally the left internal carotid artery is seen to opacify to the cranial skull base. There is an approximate 50% stenosis of the left internal carotid artery in the petrous cavernous junction, and of approximately 70% in the caval cavernous segment. The supraclinoid left ICA is widely patent. The left middle cerebral artery opacifies into the capillary and venous phases. The left anterior  cerebral artery A1 segment is hypoplastic developmentally. More distal opacification is seen in the left anterior cerebral artery A2 segment and distally. IMPRESSION: Severe high-grade stenosis of the dominant  left vertebral artery at its origin from the aortic arch between the origins of the left subclavian artery and the left common carotid artery. Approximately 50% stenosis of the left internal carotid artery petrous cavernous junction, and of 70% in the left ICA cavernous segment. Approximately 50% stenosis of the left internal carotid artery at the bulb associated with ulceration. Angiographically occluded right posterior cerebral artery. Approximately 50% stenosis of the left vertebrobasilar junction proximal to the left posterior-inferior cerebellar artery. Approximately 50% stenosis of the right internal carotid artery caval cavernous segment. PLAN: These findings were reviewed with the patient and the patient's father. Optional management regarding dual antiplatelets versus consideration of endovascular revascularization of the dominant left vertebral artery at its origin were reviewed. The father reportedly will discuss further with spouse regarding options and get back with [place] regarding the decision. Electronically Signed   By: Luanne Bras M.D.   On: 02/08/2019 17:05   Ir US Guide Vasc Access Right  Result Date: 02/11/2019 CLINICAL DATA:  History of vertebrobasilar ischemic strokes. Abnormal MRA MRI of the brain. EXAM: IR ANGIO VERTEBRAL SEL VERTEBRAL BILAT MOD SED; BILATERAL COMMON CAROTID AND INNOMINATE ANGIOGRAPHY; IR ULTRASOUND GUIDANCE VASC ACCESS RIGHT; LEFT EXTREMITY ARTERIOGRAPHY COMPARISON:  MRI MRA of the brain of 01/08/2019. MEDICATIONS: Heparin 2000 units IA;Marland Kitchen No antibiotic was administered within 1 hour of the procedure. ANESTHESIA/SEDATION: Versed 1 mg IV; Fentanyl 25 mcg IV.  Benadryl 25 mg IV. Moderate Sedation Time:  50 minutes The patient was continuously monitored during the  procedure by the interventional radiology nurse under my direct supervision. CONTRAST:  Isovue 300 approximately 60 mL. FLUOROSCOPY TIME:  Fluoroscopy Time: 17 minutes 6 seconds (1150 mGy). COMPLICATIONS: None immediate. TECHNIQUE: Informed written consent was obtained from the patient after a thorough discussion of the procedural risks, benefits and alternatives. All questions were addressed. Maximal Sterile Barrier Technique was utilized including caps, mask, sterile gowns, sterile gloves, sterile drape, hand hygiene and skin antiseptic. A timeout was performed prior to the initiation of the procedure. The right forearm to the wrist was then prepped and draped in the usual sterile fashion. The radial artery was palpated and evaluated with ultrasound in terms of morphology. This was then documented by ultrasound imaging. Thereafter, using ultrasound guidance, a micropuncture needle was advanced into the right radial artery without difficulty. This was then exchanged over a 0.018 inch micro guidewire for a 4/5 French radial sheath. The obturator and the wire were then removed. Good aspiration was obtained from the side port of the sheath. A cocktail of 2.5 mg of verapamil, 2000 units of heparin, and 200 mcg of nitroglycerin was then injected in a diluted form without any difficulty. A right radial artery angiogram was then obtained through the radial sheath. Over a 0.035 inch Roadrunner guidewire, a 5 [place] Simmons 2 diagnostic catheter was then advanced to the right subclavian artery and then the aortic arch. This was then performed in the usual manner. Selective cannulation was then performed of the right vertebral artery, the right common carotid artery, the left common carotid artery and the left vertebral artery, and the subclavian artery. Following the procedure, the diagnostic catheter was removed. The sheath was also removed with the successful application of a right radial wrist band for hemostasis. The  distal radial pulse was verified. FINDINGS: The origin of the right vertebral artery is widely patent. The vessel is seen to opacify to the cranial skull base. The right vertebrobasilar junction and the right posterior-inferior cerebellar artery demonstrate wide  patency. The opacified basilar artery, the left posterior cerebral artery, the superior cerebellar arteries and the anterior-inferior cerebellar arteries demonstrate wide patency into the capillary and venous phases. Mild arteriosclerotic changes are seen involving the mid to distal basilar artery. There appears to be tapered narrowing with occlusion of the right posterior cerebral artery P1 segment. Unopacified blood is seen in the basilar artery from the contralateral vertebral artery. The right common carotid arteriogram demonstrates the right external carotid artery and its major branches to be widely patent. The right internal carotid artery at the bulb to the cranial skull base demonstrates wide patency. The petrous and the cavernous segments are widely patent. There is approximately 50% stenosis of the caval cavernous segment of the right internal carotid artery and to a lesser degree the supraclinoid right ICA. The right middle cerebral artery and the right anterior cerebral artery demonstrate opacification into the capillary and venous phases. There is mild tapered stenosis of the proximal superior division of the right middle cerebral artery. Transient cross-filling via the anterior communicating artery of the left anterior cerebral A2 segment and distally is noted. The dominant left vertebral artery origin is from the aortic arch between the origins of the left common carotid artery and the left subclavian arteries. There is a severe high-grade stenosis at the origin of the left vertebral artery. Antegrade flow is seen in the distal left vertebral artery to the cranial skull base. Approximately 50% stenosis is seen of the vertebrobasilar junction  proximal to the origin of the left posteroinferior cerebellar artery which itself demonstrates a moderate stenosis at its origin. Distal to this the opacified portion of the basilar artery, the left posterior cerebral arteries, the superior cerebellar arteries and the anterior-inferior cerebellar arteries is seen into the capillary and venous phases. Unopacified blood is seen in the basilar artery from the contralateral vertebral artery. Again demonstrated is a tapered narrowing of the right posterior cerebral P1 segment to complete occlusion. The left common carotid arteriogram demonstrates the left external carotid artery and its major branches to be widely patent. The left internal carotid artery at the bulb has approximately 50% stenosis associated with an ulcerated plaque. No intraluminal filling defects are seen. More distally the left internal carotid artery is seen to opacify to the cranial skull base. There is an approximate 50% stenosis of the left internal carotid artery in the petrous cavernous junction, and of approximately 70% in the caval cavernous segment. The supraclinoid left ICA is widely patent. The left middle cerebral artery opacifies into the capillary and venous phases. The left anterior cerebral artery A1 segment is hypoplastic developmentally. More distal opacification is seen in the left anterior cerebral artery A2 segment and distally. 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 Approximately 50% stenosis of the left internal carotid artery petrous cavernous junction, and of 70% in the left ICA cavernous segment. Approximately 50% stenosis of the left internal carotid artery at the bulb associated with ulceration. Angiographically occluded right posterior cerebral artery. Approximately 50% stenosis of the left vertebrobasilar junction  proximal to the left posterior-inferior cerebellar artery. Approximately 50% stenosis of the right internal carotid artery caval cavernous segment. PLAN: These findings were reviewed with the patient and the patient's father. Optional management regarding dual antiplatelets versus consideration of endovascular revascularization of the dominant left vertebral artery at its origin were reviewed. The father reportedly  will discuss further with spouse regarding options and get back with [place] regarding the decision. Electronically Signed   By: Luanne Bras M.D.   On: 02/08/2019 17:05   Dg Swallowing Func-speech Pathology  Result Date: 02/06/2019 [place] [address] [place], [place], [address] Phone: [phone]   Fax:  [phone]  Modified Barium Swallow  Patient Details Name: Taylor [name] MRN: [id number] Date of Birth: 1981/02/19 No data recorded  Encounter Date: 02/06/2019     End of Session - 02/06/19 1303   Visit Number  1   Number of Visits  1   Authorization Type  Medicaid   SLP Start Time  1100   SLP Stop Time   [id number]   SLP Time Calculation (min)  36 min   Activity Tolerance  Patient tolerated treatment well       Past Medical History: Diagnosis Date  Anxiety   Bipolar disorder ([place])   Cerebral artery occlusion with cerebral infarction ([place]) 11/13  due to elevated blood sugar 1700-off insulin 6 months  Depression   Diabetes mellitus   off insulin 6 months  Headache(784.0)   Hypertension   Kidney dialysis status   on dialysis(stroke) off after 4-6 weeks  Respiratory failure ([place]) 11/13  with stroke - in hosp [facility] 12 weeks  Seizures ([place])   during elvated blood sugsr episode 11/13  Sepsis ([place])   Stroke ([place])   Vocal cord dysfunction       Past Surgical History: Procedure Laterality Date  APPENDECTOMY    INSERTION OF DIALYSIS CATHETER  11/13  removed in 4-6 weeks  MULTIPLE EXTRACTIONS WITH ALVEOLOPLASTY N/A  02/24/2014  Procedure: MULTIPLE EXTRACTION WITH ALVEOLOPLASTY AND BIOPSY;  Surgeon: Gae Bon, DDS;  Location: [place];  Service: Oral Surgery;  Laterality: N/A;  TRACHEOSTOMY  11/13  closed 1/14  TRANSRECTAL DRAINAGE OF PELVIC ABSCESS  12/11  There were no vitals filed for this visit.     Subjective Assessment - 02/06/19 1249   Subjective  "I get strangled on some foods."   Special Tests  MBSS   Currently in Pain?  No/denies     General - 02/06/19 1250         General Information  Date of Onset  11/14/18   HPI  Shiquita Householder is a 38 y.o. female who was referred by Dr. Narda Amber for MBSS due to dysphagia from recent pontine infarct.  She was admitted to [facility] on 6/3 - 6/4 for acute onset of slurred speech and left facial droop.  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 Pt with old encephalomalacia from right PCA infarct and bithalamic infarct (2013?). She continues to have difficulty with speech and swallow.  She is using the thickener for liquids.  Her left facial droop and left-sided weakness has significantly improved.    Type of Study  MBS-Modified Barium Swallow Study   Diet Prior to this Study  Regular;Thin liquids;Nectar-thick liquids   Temperature Spikes Noted  No   Respiratory Status  Room air   History of Recent Intubation  No   Behavior/Cognition  Alert;Cooperative;Pleasant mood   Oral Cavity Assessment  Dry   Oral Care Completed by SLP  No   Oral Cavity - Dentition  Dentures, top;Dentures, bottom   Vision  Functional for self feeding   Self-Feeding Abilities  Able to feed self   Patient  Positioning  Upright in chair   Baseline Vocal Quality  Normal   Volitional Cough  Strong   Volitional Swallow  Able to elicit   Anatomy  Within functional limits   Pharyngeal Secretions  Not observed secondary MBS         Oral  Preparation/Oral Phase - 02/06/19 1252         Oral Preparation/Oral Phase  Oral Phase  Impaired      Oral - Nectar  Oral - Nectar Cup  Other (Comment)  premature spillage     Oral - Thin  Oral - Thin Teaspoon  Other (Comment)  premature spillage  Oral - Thin Cup  Other (Comment)  premature spillage     Oral - Solids  Oral - Puree  Within functional limits   Oral - Regular  Oral residue;Decreased bolus cohesion   Oral - Pill  Within functional limits  in puree     Electrical stimulation - Oral Phase  Was Electrical Stimulation Used  No       Pharyngeal Phase - 02/06/19 1256         Pharyngeal Phase  Pharyngeal Phase  Impaired      Pharyngeal - Nectar  Pharyngeal- Nectar Cup  Delayed swallow initiation;Swallow initiation at pyriform sinus;Reduced airway/laryngeal closure;Penetration/Aspiration during swallow;Penetration/Apiration after swallow;Trace aspiration   Pharyngeal  Material does not enter airway;Material enters airway, passes BELOW cords then ejected out      Pharyngeal - Thin  Pharyngeal- Thin Teaspoon  Swallow initiation at vallecula;Swallow initiation at pyriform sinus;Reduced airway/laryngeal closure;Penetration/Aspiration during swallow;Trace aspiration   Pharyngeal  Material does not enter airway;Material enters airway, passes BELOW cords without attempt by patient to eject out (silent aspiration)   Pharyngeal- Thin Cup  Delayed swallow initiation;Swallow initiation at pyriform sinus;Reduced airway/laryngeal closure;Moderate aspiration;Trace aspiration;Penetration/Aspiration during swallow   Pharyngeal  Material does not enter airway;Material enters airway, remains ABOVE vocal cords then ejected out;Material enters airway, passes BELOW cords without attempt by patient to eject out (silent aspiration);Material enters airway, passes BELOW cords and not ejected out despite cough attempt by patient      Pharyngeal - Solids  Pharyngeal- Puree  Within functional  limits;Swallow initiation at vallecula   Pharyngeal- Regular  Delayed swallow initiation-vallecula;Within functional limits   Pharyngeal- Pill  Within functional limits;Swallow initiation at vallecula  in puree     Electrical Stimulation - Pharyngeal Phase  Was Electrical Stimulation Used  No       Cricopharyngeal Phase - 02/06/19 1301         Cervical Esophageal Phase  Cervical Esophageal Phase  Within functional limits       Plan - 02/06/19 1303   Clinical Impression Statement  Pt presents with mild/mod oropharyngeal dysphagia characterized by premature spillage of liquids over base of tongue with delay in swallow initiation and reduced laryngeal closure resulting in variable penetration and aspiration of thins and NTL during and after the swallow with and without sensation (silent at times). Pt reported getting "strangled" on solid foods, however she demonstrated adequate pharyngeal strength and no residuals with solids today. Pt's primary deficit appears to be due to poor oral control (premature spillage) with delay in initiation and reduced vocal fold closure. Pt without pharyngeal residuals, but did have mild lingual residuals necessitating a repeat/dry swallow. Pt reports that she drinks both thin and NTL liquids at home and denies h/o of PNA. Suspect that Pt has been aspirating thins, but has managed to avoid PNA. Recommend self regulated regular textures (  she was encouraged to avoid known problem solid foods) and thin liquids with the following precautions: small sip, hold orally, swallow quickly, clear throat, and swallow again. The chin tuck and supraglottic was tried and found to be ineffective. Also recommend ENT consult to evaluate vocal folds to see if augmentation is possible and/or vocal fold adduction exercises. Pt reports occasional difficulties with swallow since her first stroke requiring trach in 2013. This study and all recommendations were reviewed with Pt and provided to her in  written format. She was also given my contact information should her mother have questions as she did not attend the evaluation today. It is unclear whether Pt is still receiving [facility] SLP services and Pt is a poor historian.  Consulted and Agree with Plan of Care  Patient     Patient will benefit from skilled therapeutic intervention in order to improve the following deficits and impairments:  Dysphagia, oropharyngeal phase      Recommendations/Treatment - 02/06/19 1301         Swallow Evaluation Recommendations  Recommended Consults  Consider ENT evaluation  to evaluate vocal folds  SLP Diet Recommendations  Age appropriate regular;Thin;Nectar   Liquid Administration via  No straw;Cup  Pt must take small sips for thins  Medication Administration  Whole meds with puree   Supervision  Patient able to self feed   Compensations  Small sips/bites;Multiple dry swallows after each bite/sip;Clear throat intermittently   Postural Changes  Seated upright at 90 degrees;Remain upright for at least 30 minutes after feeds/meals       Prognosis - 02/06/19 1302         Prognosis  Prognosis for Safe Diet Advancement  Fair      Individuals Consulted  Consulted and Agree with Results and Recommendations  Patient   Report Sent to   Referring physician    Problem List    Patient Active Problem List  Diagnosis Date Noted  Intracranial carotid stenosis, left 01/23/2019  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  Tobacco use disorder 01/02/2015  Cognitive impairment 01/02/2015  Stroke ([place]) 09/07/2012  Pain in upper limb 09/07/2012  Pain in lower limb 09/07/2012  Bipolar affective disorder ([place]) 09/07/2012  Depressive disorder 09/07/2012  Type II diabetes mellitus ([place]) 09/07/2012  Memory loss 09/07/2012  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  Personal history of noncompliance with medical treatment, presenting hazards to health 05/25/2012  Cerebral artery  occlusion with cerebral infarction [facility]) 05/10/2012 Thank you,  Genene Churn, [place]  [place] 02/06/2019, 1:04 PM  [place], [place], [address] Phone: [phone]   Fax:  [phone]  Name: Taylor [name] MRN: [id number] Date of Birth: Feb 21, 1981    Electronically signed by Ephraim Hamburger, CCC-SLP at 02/06/2019 1:17 PM   Ir Angio Intra Extracran Sel Com Carotid Innominate Bilat Mod Sed  Result Date: 02/11/2019 CLINICAL DATA:  History of vertebrobasilar ischemic strokes. Abnormal MRA MRI of the brain. EXAM: IR ANGIO VERTEBRAL SEL VERTEBRAL BILAT MOD SED; BILATERAL COMMON CAROTID AND INNOMINATE ANGIOGRAPHY; IR ULTRASOUND GUIDANCE VASC ACCESS RIGHT; LEFT EXTREMITY ARTERIOGRAPHY COMPARISON:  MRI MRA of the brain of 01/08/2019. MEDICATIONS: Heparin 2000 units IA;Marland Kitchen No antibiotic was administered within 1 hour of the procedure. ANESTHESIA/SEDATION: Versed 1 mg IV; Fentanyl 25 mcg IV.  Benadryl 25 mg IV. Moderate Sedation Time:  50 minutes The patient was continuously monitored during the procedure by the interventional radiology nurse under my direct supervision. CONTRAST:  Isovue 300 approximately 60 mL. FLUOROSCOPY TIME:  Fluoroscopy Time: 17 minutes 6 seconds (1150 mGy). COMPLICATIONS: None immediate. TECHNIQUE: Informed written consent was obtained from the patient after a thorough discussion of the procedural risks, benefits and alternatives. All questions were addressed. Maximal Sterile Barrier Technique was utilized including caps, mask, sterile gowns, sterile gloves, sterile drape, hand hygiene and skin antiseptic. A timeout was performed prior to the initiation of the procedure. The right forearm to the wrist was then prepped and draped in the usual sterile fashion. The radial artery was palpated and evaluated with ultrasound in terms of morphology. This was then documented by ultrasound imaging. Thereafter, using  ultrasound guidance, a micropuncture needle was advanced into the right radial artery without difficulty. This was then exchanged over a 0.018 inch micro guidewire for a 4/5 French radial sheath. The obturator and the wire were then removed. Good aspiration was obtained from the side port of the sheath. A cocktail of 2.5 mg of verapamil, 2000 units of heparin, and 200 mcg of nitroglycerin was then injected in a diluted form without any difficulty. A right radial artery angiogram was then obtained through the radial sheath. Over a 0.035 inch Roadrunner guidewire, a 5 [place] Simmons 2 diagnostic catheter was then advanced to the right subclavian artery and then the aortic arch. This was then performed in the usual manner. Selective cannulation was then performed of the right vertebral artery, the right common carotid artery, the left common carotid artery and the left vertebral artery, and the subclavian artery. Following the procedure, the diagnostic catheter was removed. The sheath was also removed with the successful application of a right radial wrist band for hemostasis. The distal radial pulse was verified. FINDINGS: The origin of the right vertebral artery is widely patent. The vessel is seen to opacify to the cranial skull base. The right vertebrobasilar junction and the right posterior-inferior cerebellar artery demonstrate wide patency. The opacified basilar artery, the left posterior cerebral artery, the superior cerebellar arteries and the anterior-inferior cerebellar arteries demonstrate wide patency into the capillary and venous phases. Mild arteriosclerotic changes are seen involving the mid to distal basilar artery. There appears to be tapered narrowing with occlusion of the right posterior cerebral artery P1 segment. Unopacified blood is seen in the basilar artery from the contralateral vertebral artery. The right common carotid arteriogram demonstrates the right external carotid artery and its major  branches to be widely patent. The right internal carotid artery at the bulb to the cranial skull base demonstrates wide patency. The petrous and the cavernous segments are widely patent. There is approximately 50% stenosis of the caval cavernous segment of the right internal carotid artery and to a lesser degree the supraclinoid right ICA. The right middle cerebral artery and the right anterior cerebral artery demonstrate opacification into the capillary and venous phases. There is mild tapered stenosis of the proximal superior division of the right middle cerebral artery. Transient cross-filling via the anterior communicating artery of the left anterior cerebral A2 segment and distally is noted. The dominant left vertebral artery origin is from the aortic arch between the origins of the left common carotid artery and the left subclavian arteries. There is a severe high-grade stenosis at the origin of the left vertebral artery. Antegrade flow is seen in the distal left vertebral artery to the cranial skull base. Approximately 50% stenosis is seen of the vertebrobasilar junction proximal to the origin of the left posteroinferior cerebellar artery which itself demonstrates a moderate stenosis at  its origin. Distal to this the opacified portion of the basilar artery, the left posterior cerebral arteries, the superior cerebellar arteries and the anterior-inferior cerebellar arteries is seen into the capillary and venous phases. Unopacified blood is seen in the basilar artery from the contralateral vertebral artery. Again demonstrated is a tapered narrowing of the right posterior cerebral P1 segment to complete occlusion. The left common carotid arteriogram demonstrates the left external carotid artery and its major branches to be widely patent. The left internal carotid artery at the bulb has approximately 50% stenosis associated with an ulcerated plaque. No intraluminal filling defects are seen. More distally the left  internal carotid artery is seen to opacify to the cranial skull base. There is an approximate 50% stenosis of the left internal carotid artery in the petrous cavernous junction, and of approximately 70% in the caval cavernous segment. The supraclinoid left ICA is widely patent. The left middle cerebral artery opacifies into the capillary and venous phases. The left anterior cerebral artery A1 segment is hypoplastic developmentally. More distal opacification is seen in the left anterior cerebral artery A2 segment and distally. 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 Approximately 50% stenosis of the left internal carotid artery petrous cavernous junction, and of 70% in the left ICA cavernous segment. Approximately 50% stenosis of the left internal carotid artery at the bulb associated with ulceration. Angiographically occluded right posterior cerebral artery. Approximately 50% stenosis of the left vertebrobasilar junction proximal to the left posterior-inferior cerebellar artery. Approximately 50% stenosis of the right internal carotid artery caval cavernous segment. PLAN: These findings were reviewed with the patient and the patient's father. Optional management regarding dual antiplatelets versus consideration of endovascular revascularization of the dominant left vertebral artery at its origin were reviewed. The father reportedly will discuss further with spouse regarding options and get back with [place] regarding the decision. Electronically Signed   By: Luanne Bras M.D.   On: 02/08/2019 17:05   Ir Angio Vertebral Sel Vertebral Bilat Mod Sed  Result Date: 02/11/2019 CLINICAL DATA:  History of vertebrobasilar ischemic strokes. Abnormal MRA MRI of the brain. EXAM: IR ANGIO VERTEBRAL SEL VERTEBRAL BILAT MOD SED; BILATERAL COMMON CAROTID AND INNOMINATE  ANGIOGRAPHY; IR ULTRASOUND GUIDANCE VASC ACCESS RIGHT; LEFT EXTREMITY ARTERIOGRAPHY COMPARISON:  MRI MRA of the brain of 01/08/2019. MEDICATIONS: Heparin 2000 units IA;Marland Kitchen No antibiotic was administered within 1 hour of the procedure. ANESTHESIA/SEDATION: Versed 1 mg IV; Fentanyl 25 mcg IV.  Benadryl 25 mg IV. Moderate Sedation Time:  50 minutes The patient was continuously monitored during the procedure by the interventional radiology nurse under my direct supervision. CONTRAST:  Isovue 300 approximately 60 mL. FLUOROSCOPY TIME:  Fluoroscopy Time: 17 minutes 6 seconds (1150 mGy). COMPLICATIONS: None immediate. TECHNIQUE: Informed written consent was obtained from the patient after a thorough discussion of the procedural risks, benefits and alternatives. All questions were addressed. Maximal Sterile Barrier Technique was utilized including caps, mask, sterile gowns, sterile gloves, sterile drape, hand hygiene and skin antiseptic. A timeout was performed prior to the initiation of the procedure. The right forearm to the wrist was then prepped and draped in the usual sterile fashion. The radial artery was palpated and evaluated with ultrasound in terms of morphology. This was then documented by ultrasound imaging. Thereafter, using ultrasound guidance, a micropuncture needle was advanced into the right radial artery without difficulty. This  was then exchanged over a 0.018 inch micro guidewire for a 4/5 French radial sheath. The obturator and the wire were then removed. Good aspiration was obtained from the side port of the sheath. A cocktail of 2.5 mg of verapamil, 2000 units of heparin, and 200 mcg of nitroglycerin was then injected in a diluted form without any difficulty. A right radial artery angiogram was then obtained through the radial sheath. Over a 0.035 inch Roadrunner guidewire, a 5 [place] Simmons 2 diagnostic catheter was then advanced to the right subclavian artery and then the aortic arch. This was then  performed in the usual manner. Selective cannulation was then performed of the right vertebral artery, the right common carotid artery, the left common carotid artery and the left vertebral artery, and the subclavian artery. Following the procedure, the diagnostic catheter was removed. The sheath was also removed with the successful application of a right radial wrist band for hemostasis. The distal radial pulse was verified. FINDINGS: The origin of the right vertebral artery is widely patent. The vessel is seen to opacify to the cranial skull base. The right vertebrobasilar junction and the right posterior-inferior cerebellar artery demonstrate wide patency. The opacified basilar artery, the left posterior cerebral artery, the superior cerebellar arteries and the anterior-inferior cerebellar arteries demonstrate wide patency into the capillary and venous phases. Mild arteriosclerotic changes are seen involving the mid to distal basilar artery. There appears to be tapered narrowing with occlusion of the right posterior cerebral artery P1 segment. Unopacified blood is seen in the basilar artery from the contralateral vertebral artery. The right common carotid arteriogram demonstrates the right external carotid artery and its major branches to be widely patent. The right internal carotid artery at the bulb to the cranial skull base demonstrates wide patency. The petrous and the cavernous segments are widely patent. There is approximately 50% stenosis of the caval cavernous segment of the right internal carotid artery and to a lesser degree the supraclinoid right ICA. The right middle cerebral artery and the right anterior cerebral artery demonstrate opacification into the capillary and venous phases. There is mild tapered stenosis of the proximal superior division of the right middle cerebral artery. Transient cross-filling via the anterior communicating artery of the left anterior cerebral A2 segment and distally is  noted. The dominant left vertebral artery origin is from the aortic arch between the origins of the left common carotid artery and the left subclavian arteries. There is a severe high-grade stenosis at the origin of the left vertebral artery. Antegrade flow is seen in the distal left vertebral artery to the cranial skull base. Approximately 50% stenosis is seen of the vertebrobasilar junction proximal to the origin of the left posteroinferior cerebellar artery which itself demonstrates a moderate stenosis at its origin. Distal to this the opacified portion of the basilar artery, the left posterior cerebral arteries, the superior cerebellar arteries and the anterior-inferior cerebellar arteries is seen into the capillary and venous phases. Unopacified blood is seen in the basilar artery from the contralateral vertebral artery. Again demonstrated is a tapered narrowing of the right posterior cerebral P1 segment to complete occlusion. The left common carotid arteriogram demonstrates the left external carotid artery and its major branches to be widely patent. The left internal carotid artery at the bulb has approximately 50% stenosis associated with an ulcerated plaque. No intraluminal filling defects are seen. More distally the left internal carotid artery is seen to opacify to the cranial skull base. There is an approximate 50% stenosis  of the left internal carotid artery in the petrous cavernous junction, and of approximately 70% in the caval cavernous segment. The supraclinoid left ICA is widely patent. The left middle cerebral artery opacifies into the capillary and venous phases. The left anterior cerebral artery A1 segment is hypoplastic developmentally. More distal opacification is seen in the left anterior cerebral artery A2 segment and distally. 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 Approximately 50% stenosis of the left internal carotid artery petrous cavernous junction, and of 70% in the left ICA cavernous segment. Approximately 50% stenosis of the left internal carotid artery at the bulb associated with ulceration. Angiographically occluded right posterior cerebral artery. Approximately 50% stenosis of the left vertebrobasilar junction proximal to the left posterior-inferior cerebellar artery. Approximately 50% stenosis of the right internal carotid artery caval cavernous segment. PLAN: These findings were reviewed with the patient and the patient's father. Optional management regarding dual antiplatelets versus consideration of endovascular revascularization of the dominant left vertebral artery at its origin were reviewed. The father reportedly will discuss further with spouse regarding options and get back with [place] regarding the decision. Electronically Signed   By: Luanne Bras M.D.   On: 02/08/2019 17:05    Labs:  CBC: Recent Labs    11/14/18 1521 02/05/19 1051 02/20/19 0700  WBC 8.3 13.3* 12.5*  HGB 11.7* 12.1 11.8*  HCT 37.2 38.5 39.0  PLT 183 212 223    COAGS: Recent Labs    02/05/19 1051 02/20/19 0700  INR 1.1 1.0  APTT  --  24    BMP: Recent Labs    11/14/18 1521 02/05/19 1051 02/20/19 0700  NA 135 138 135  K 4.0 4.1 4.6  CL 101 103 104  CO2 23 20* 18*  GLUCOSE 235* 238* 319*  BUN 14 19 21*  CALCIUM 9.0 9.4 9.8  CREATININE 0.72 0.94 0.97  GFRNONAA >60 >60 >60  GFRAA >60 >60 >60    LIVER FUNCTION TESTS: No results for input(s): BILITOT, AST, ALT, ALKPHOS, PROT, ALBUMIN in the last 8760 hours.  TUMOR MARKERS: No results for input(s): AFPTM, CEA, CA199, CHROMGRNA in the last 8760 hours.  Assessment and Plan:  CVA x 14 December 2018; June 2020 and 2013 L Vertebral artery stenosis on most recent angiogram Scheduled today for cerebral arteriogram with intent to treat left vert stenosis with  angioplasty/stent. Risks and benefits of cerebral angiogram with intervention were discussed with the patient including, but not limited to bleeding, infection, vascular injury, contrast induced renal failure, stroke or even death.  This interventional procedure involves the use of X-rays and because of the nature of the planned procedure, it is possible that we will have prolonged use of X-ray fluoroscopy.  Potential radiation risks to you include (but are not limited to) the following: - A slightly elevated risk for cancer  several years later in life. This risk is typically less than 0.5% percent. This risk is low in comparison to the normal incidence of human cancer, which is 33% for women and 50% for men according to the [employer]. - Radiation induced injury can include skin redness, resembling a rash, tissue breakdown / ulcers and hair loss (which can be temporary or permanent).   The likelihood of either of these occurring depends on the difficulty of the procedure and whether you are sensitive to radiation due  to previous procedures, disease, or genetic conditions.   IF your procedure requires a prolonged use of radiation, you will be notified and given written instructions for further action.  It is your responsibility to monitor the irradiated area for the 2 weeks following the procedure and to notify your physician if you are concerned that you have suffered a radiation induced injury.    All of the patient's and mother's questions were answered, patient is agreeable to proceed.  Consent signed and in chart.  Thank you for this interesting consult.  I greatly enjoyed meeting Taylor [name] and look forward to participating in their care.  A copy of this report was sent to the requesting provider on this date.  Electronically Signed: Ascencion Dike, PA-C 02/20/2019, 7:38 AM   I spent a total of  30 Minutes   in face to face in clinical consultation, greater than 50% of  which was counseling/coordinating care for cerebral arteriogram with stent angioplasty

## 2019-02-20 NOTE — Progress Notes (Signed)
Patient ID: Taylor [name], female   DOB: 17-Aug-1980, 38 y.o.   MRN: [id number] INR.Patient seen with mother. Since arteriogram of 02/07/19 patient reports no new neuro or systemic changes. Denies new visual ,motor sensory or speech difficlties. No change in her occasional imbalance. 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 Patient here today for revascularization of a severe stenosis of the dominant Lt VA at its origin to prevent future potentially disabling ischemia related to the Lt [place] origin stenosis. Procedure,and alternative of continuing antiplatelet treatment reviewed. Risks of an intra procedural or peri procedural stroke of 3 to 5 %ischemic or hemorrhagic)  discussed with remote possibility of death. Patient and mother expressed understanding and wish to proceed with treatment.  O/E Oriented to place, year and month. Lt visual field defect.  MIld weak RT hand grip.Marland Kitchen S.Adelaide Pfefferkorn MD

## 2019-02-20 NOTE — Progress Notes (Signed)
Patient took benadryl and prednisone home meds to premedicate for contrast allergy after arrival to short stay.

## 2019-02-20 NOTE — Anesthesia Procedure Notes (Signed)
Arterial Line Insertion Start/End9/02/2019 9:25 AM, 02/20/2019 9:29 AM Performed by: Harden Mo, CRNA  Patient location: Pre-op. Preanesthetic checklist: patient identified, IV checked, site marked, risks and benefits discussed, surgical consent, monitors and equipment checked, pre-op evaluation and timeout performed Lidocaine 1% used for infiltration and patient sedated Left, radial was placed Catheter size: 20 G Hand hygiene performed , maximum sterile barriers used  and Seldinger technique used  Attempts: 2 Procedure performed without using ultrasound guided technique. Following insertion, dressing applied and Biopatch. Post procedure assessment: normal  Patient tolerated the procedure well with no immediate complications.

## 2019-02-20 NOTE — Progress Notes (Signed)
Inpatient Diabetes Program Recommendations  AACE/ADA: New Consensus Statement on Inpatient Glycemic Control (2015)  Target Ranges:  Prepandial:   less than 140 mg/dL      Peak postprandial:   less than 180 mg/dL (1-2 hours)      Critically ill patients:  140 - 180 mg/dL   Lab Results  Component Value Date   GLUCAP 221 (H) 02/20/2019    Review of Glycemic Control Results for SYDEL, WILENSKY (MRN [id number]) as of 02/20/2019 14:30  Ref. Range 02/20/2019 06:36 02/20/2019 09:00 02/20/2019 13:25  Glucose-Capillary Latest Ref Range: 70 - 99 mg/dL 315 (H) 297 (H) 221 (H)   Diabetes history: Type 2 DM Outpatient Diabetes medications: Farxiga 10 mg QAM, Metformin 500 mg BID Current orders for Inpatient glycemic control: Invokana 100 mg QAM Decadron 10 mg x 1  Inpatient Diabetes Program Recommendations:    Anticipate glucose trends to increase due to steroids.   Consider the following:  - Novolog 0-9 units TID & HS - Levemir 12 units QHS  Thanks, Bronson Curb, MSN, RNC-OB Diabetes Coordinator [phone] (8a-5p)

## 2019-02-20 NOTE — Progress Notes (Signed)
NIR.  Patient was scheduled for an image-guided cerebral arteriogram with possible revascularization of left VA stenosis this AM with Dr. Estanislado Pandy. Procedure began via right femoral approach, however was complicated by development of pseudoaneurysm at puncture site and procedure was aborted following vascular access. She was transferred to PACU prior to admission for overnight observation.  VAS US revealed "An area with well defined borders measuring 1.0 cm x 1.1 cm was visualized arising off of the common femoral artery with ultrasound characteristics of a pseudoaneurysm. The neck measures approximately 0.24 cm wide and 0.147 cm long."  Saw patient alongside Dr. Estanislado Pandy in PACU following VAS [place]. Patient awake and alert laying in bed. States that she is hungry. Denies headache or diplopia.  Right groin with mild firmness and tenderness at medial aspect of puncture site. Distal pulses intact bilaterally with Doppler.  Plan to transfer to floor for overnight observation. Repeat VAS [place] in AM (if improved possible discharge, if stable/worsens possible thrombolysis- will revisit following repeat US). Further orders per Dr. Estanislado Pandy. NIR to follow.   Bea Graff Louk, PA-C 02/20/2019, 4:27 PM

## 2019-02-20 NOTE — Progress Notes (Signed)
Patient ID: Taylor [name], female   DOB: Oct 30, 1980, 38 y.o.   MRN: [id number] INR Patient extubated. Recovering slowly from [place]. Opens eyes to command. Moves both feet and LUE to command. Rt UE Fair hand grip and able to bend at the elbow. RT groin soft. Distal pulses all dopplerable. S.Apoorva Bugay MD

## 2019-02-20 NOTE — Progress Notes (Signed)
Dr. Kalman Shan notified patient's [facility]G 315 upon arrival to short stay.  Received verbal order for 10 units novolog SQ.  WIll continue to monitor patient.

## 2019-02-20 NOTE — Progress Notes (Signed)
Dinner tray ordered for patient.

## 2019-02-20 NOTE — Anesthesia Procedure Notes (Signed)
Procedure Name: Intubation Date/Time: 02/20/2019 10:00 AM Performed by: Amadeo Garnet, CRNA Pre-anesthesia Checklist: Patient identified, Emergency Drugs available, Suction available, Patient being monitored and Timeout performed Patient Re-evaluated:Patient Re-evaluated prior to induction Oxygen Delivery Method: Circle system utilized Preoxygenation: Pre-oxygenation with 100% oxygen Induction Type: IV induction Ventilation: Mask ventilation without difficulty Laryngoscope Size: Mac and 3 Grade View: Grade I Tube type: Oral Tube size: 7.0 mm Number of attempts: 1 Airway Equipment and Method: Stylet Placement Confirmation: ETT inserted through vocal cords under direct vision,  positive ETCO2 and breath sounds checked- equal and bilateral Secured at: 21 cm Tube secured with: Tape Dental Injury: Teeth and Oropharynx as per pre-operative assessment

## 2019-02-20 NOTE — Progress Notes (Signed)
Right lower ext arterial duplex  has been completed. Refer to [facility] under chart review to view preliminary results.  Verbal results given to patient's nurse, Sharyn Lull.   02/20/2019  3:17 PM Balraj Brayfield, Bonnye Fava

## 2019-02-20 NOTE — H&P (Deleted)
  The note originally documented on this encounter has been moved the the encounter in which it belongs.  

## 2019-02-20 NOTE — Progress Notes (Signed)
Dr. Kalman Shan notified [facility] 297.  No new orders received.  Will continue to monitor patient.

## 2019-02-21 ENCOUNTER — Observation Stay (HOSPITAL_BASED_OUTPATIENT_CLINIC_OR_DEPARTMENT_OTHER): Payer: Medicaid Other

## 2019-02-21 ENCOUNTER — Encounter (HOSPITAL_COMMUNITY): Payer: Medicaid Other

## 2019-02-21 ENCOUNTER — Encounter (HOSPITAL_COMMUNITY): Payer: Self-pay | Admitting: Interventional Radiology

## 2019-02-21 DIAGNOSIS — I724 Aneurysm of artery of lower extremity: Secondary | ICD-10-CM

## 2019-02-21 DIAGNOSIS — I6502 Occlusion and stenosis of left vertebral artery: Secondary | ICD-10-CM | POA: Diagnosis not present

## 2019-02-21 NOTE — Progress Notes (Signed)
Patient assessed this AM at bedside alongside Dr. Estanislado Pandy. Patient with significant bruising to groin after attempted procedure yesterday.  [place] yesterday afternoons showed 1.0 cm x 1.1 cm pseudoaneurysm. States she is tender in her groin.  Has not been ambulating much in room except to bedside commode.  Groin soft but tender. Obtain Vascular US STAT to assess status.  Order placed.  Vascular US aware.  Patient for possible discharge today if groin stable without further signs of bleeding or worsening of pseudoaneurysm.   Brynda Greathouse, MS RD PA-C

## 2019-02-21 NOTE — Discharge Summary (Signed)
Patient ID: Taylor [name] MRN: [id number] DOB/AGE: Sep 18, 1980 38 y.o.  Admit date: 02/20/2019 Discharge date: 02/21/2019  Supervising Physician: Luanne Bras  Patient Status: [facility] - In-pt  Admission Diagnoses: Vertebrobasilar stenosis  Discharge Diagnoses:  Active Problems:   VBI (vertebrobasilar insufficiency)   Discharged Condition: good  Hospital Course:   Taylor [name] is a 38 year old female with known intracranial cerebrovascular disease and severe left vertebral stenosis who presented to [facility] yesterday for angiogram with intent to treat.  The procedure was attempted 02/20/19 via right CFA approach however due to body habitus and anatomy of her arteries, access was difficult and resulted in a right common femoral side wall aneurysm with a narrow neck.  The procedure was terminated and pseudoaneurysm was confirmed with vascular US.  Patient was admitted overnight for monitoring of her procedure site.  She is accessed this AM and found to be in stable condition, sitting up eating breakfast with tolerance.  States she has been able to get OOB without difficulty. She reported tenderness to her groin, but no worsening of her pain or bruising since yesterday.  A repeat vascular ultrasound this afternoon shows resolve of her pseudoaneurysm; although final report is pending.  Her groin is mildly tender but soft.  There is still extensive bruising which appears overall unchanged.  Dr. Estanislado Pandy has met with the patient and spoken to her family regarding care at discharge.  She is stable for discharge home today.   Discharge Exam: Blood pressure 101/67, pulse 92, temperature 98 F (36.7 C), temperature source Oral, resp. rate 14, height '5\' 5"'$  (1.651 m), weight 214 lb (97.1 kg), last menstrual period 02/20/2019, SpO2 92 %. General appearance: alert, cooperative and no distress Resp: clear to auscultation bilaterally Cardio: regular rate and rhythm, S1, S2 normal, no murmur, click, rub or  gallop GI: soft, non-tender; bowel sounds normal; no masses,  no organomegaly Skin: Skin color, texture, turgor normal. No rashes or lesions Incision/Wound: Puncture site intact.  Small amount of dried blood under dressing.  Soft, compressible without discrete areas of hardness to suggest presence of pseudoaneurysm.  Extensive bruising present.  Disposition: Discharge disposition: 01-Home or Self Care       Discharge Instructions    Call MD for:  persistant nausea and vomiting   Complete by: As directed    Call MD for:  redness, tenderness, or signs of infection (pain, swelling, redness, odor or green/yellow discharge around incision site)   Complete by: As directed    Call MD for:  severe uncontrolled pain   Complete by: As directed    Call MD for:  temperature >100.4   Complete by: As directed    Diet - low sodium heart healthy   Complete by: As directed    Discharge instructions   Complete by: As directed    Due to the injury in your right groin no bending, lifting, stooping, or sudden movements involving the right lower leg.   Apply pressure to the groin when lifting leg (I.e. in order to get in the car or climb the stairs). If you feel a hardness or "knot" in the groin call our office or go to the ED for evaluation.  Schedulers will contact you with date and time of appointment.   Increase activity slowly   Complete by: As directed      Allergies as of 02/21/2019      Reactions   Contrast Media [iodinated Diagnostic Agents] Other (See Comments)   Martin Majestic  into kidney failure    Amitriptyline Other (See Comments)   Unresponsive    Compazine Other (See Comments)   Unknown   Oxycodone Nausea And Vomiting   Severe patient will not take oxycodone or percocet.   Prochlorperazine Other (See Comments)   Alert but unable to move   Promethazine Swelling   Lips swell   Sumatriptan Rash      Medication List    TAKE these medications   acetaminophen 325 MG tablet Commonly  known as: TYLENOL Take 650 mg by mouth every 6 (six) hours as needed. pain   aspirin EC 325 MG tablet Take 325 mg by mouth daily.   atorvastatin 40 MG tablet Commonly known as: LIPITOR Take 40 mg by mouth daily.   atorvastatin 80 MG tablet Commonly known as: LIPITOR Take 1 tablet (80 mg total) by mouth daily.   clopidogrel 75 MG tablet Commonly known as: PLAVIX Take 1 tablet (75 mg total) by mouth daily.   diazepam 5 MG tablet Commonly known as: VALIUM Take 5 mg by mouth 2 (two) times daily.   diphenhydrAMINE 25 MG tablet Commonly known as: BENADRYL Take 50 mg by mouth See admin instructions. Take '50mg'$  by mouth one hour prior to procedure   DULoxetine 30 MG capsule Commonly known as: CYMBALTA Take 30 mg by mouth daily.   Farxiga 10 MG Tabs tablet Generic drug: dapagliflozin propanediol Take 10 mg by mouth every morning.   gabapentin 300 MG capsule Commonly known as: NEURONTIN Take 3 tablets in the morning, 3 tab in the afternoon, and 4 tablets at bedtime. What changed:   how much to take  how to take this  when to take this   ibuprofen 600 MG tablet Commonly known as: ADVIL Take 600 mg by mouth 2 (two) times daily.   labetalol 300 MG tablet Commonly known as: NORMODYNE Take 300 mg by mouth 2 (two) times daily.   levETIRAcetam 500 MG 24 hr tablet Commonly known as: KEPPRA XR Take 1,000 mg by mouth daily.   lisinopril 40 MG tablet Commonly known as: ZESTRIL Take 40 mg by mouth daily.   metFORMIN 500 MG tablet Commonly known as: GLUCOPHAGE Take 500 mg by mouth 2 (two) times daily.   omeprazole 20 MG capsule Commonly known as: PRILOSEC Take 20 mg by mouth daily.   predniSONE 50 MG tablet Commonly known as: DELTASONE Take 50 mg by mouth See admin instructions. Take one tablet by mouth 13 hours, 7 hours, and 1 hour before procedure   risperiDONE 0.5 MG tablet Commonly known as: RISPERDAL Take 0.5 mg by mouth at bedtime.   traZODone 100 MG tablet  Commonly known as: DESYREL Take 200 mg by mouth at bedtime.      Follow-up Information    Luanne Bras, MD Follow up.   Specialties: Interventional Radiology, Radiology Why: Schedulers will call you with date and time of follow-up appointment.  Contact information: [place] [address] [phone]            Electronically Signed: Docia Barrier, PA 02/21/2019, 3:08 PM   I have spent Greater Than 30 Minutes discharging Taylor [name].

## 2019-02-21 NOTE — Progress Notes (Signed)
Follow - up pseudo evaluation completed. Preliminary results in Chart review CV proc. [facility], [place] 02/21/2019, 3:34 PM

## 2019-02-25 ENCOUNTER — Encounter (HOSPITAL_COMMUNITY): Payer: Self-pay | Admitting: Interventional Radiology

## 2019-02-27 ENCOUNTER — Encounter (HOSPITAL_COMMUNITY): Payer: Self-pay

## 2019-02-27 ENCOUNTER — Other Ambulatory Visit (HOSPITAL_COMMUNITY): Payer: Self-pay | Admitting: Interventional Radiology

## 2019-02-27 DIAGNOSIS — I771 Stricture of artery: Secondary | ICD-10-CM

## 2019-03-13 ENCOUNTER — Other Ambulatory Visit (HOSPITAL_COMMUNITY): Payer: Self-pay | Admitting: Interventional Radiology

## 2019-03-13 DIAGNOSIS — I6509 Occlusion and stenosis of unspecified vertebral artery: Secondary | ICD-10-CM

## 2019-03-21 ENCOUNTER — Other Ambulatory Visit (HOSPITAL_COMMUNITY)
Admission: RE | Admit: 2019-03-21 | Discharge: 2019-03-21 | Disposition: A | Discharge status: Home / Self Care | Payer: Medicaid Other | Source: Ambulatory Visit | Attending: Interventional Radiology | Admitting: Interventional Radiology

## 2019-03-21 ENCOUNTER — Encounter (HOSPITAL_COMMUNITY): Payer: Self-pay | Admitting: *Deleted

## 2019-03-21 ENCOUNTER — Other Ambulatory Visit (HOSPITAL_COMMUNITY): Payer: Self-pay | Admitting: Radiology

## 2019-03-21 ENCOUNTER — Other Ambulatory Visit: Payer: Self-pay

## 2019-03-21 DIAGNOSIS — Z01812 Encounter for preprocedural laboratory examination: Secondary | ICD-10-CM | POA: Insufficient documentation

## 2019-03-21 DIAGNOSIS — I639 Cerebral infarction, unspecified: Secondary | ICD-10-CM

## 2019-03-21 DIAGNOSIS — Z20828 Contact with and (suspected) exposure to other viral communicable diseases: Secondary | ICD-10-CM | POA: Diagnosis not present

## 2019-03-21 LAB — SARS CORONAVIRUS 2 (TAT 6-24 HRS): SARS Coronavirus 2: NEGATIVE

## 2019-03-21 NOTE — Progress Notes (Signed)
Spoke with pt's mother, Keirsten Hamann for pre-op call. DPR on file. Pt has long term and short term memory issues. Pt is a type 2 diabetic. Last A1C was last week and it was 8.1. Mom states pt does not check her blood sugar at home. Instructed mom to hold pt's Farxiga Sunday and Monday. Also to hold Metformin the day of surgery. Mom states pt was started on Doxycycline yesterday for an ulcer on her left foot, no drainage noted per mom.   Pt will have her Covid test done today. Quarantine instructions give to pt's mother. She voiced understanding.   Mother is aware of the visitation policy. She is aware that she can come into the pre-op area with pt day of surgery. She has asked that when pt gets to PACU and has to wait for a long time for a room, Mom would like to be able to go back to PACU and be with pt.

## 2019-03-22 ENCOUNTER — Other Ambulatory Visit: Payer: Self-pay | Admitting: Radiology

## 2019-03-25 ENCOUNTER — Ambulatory Visit (HOSPITAL_COMMUNITY): Payer: Medicaid Other | Admitting: Anesthesiology

## 2019-03-25 ENCOUNTER — Inpatient Hospital Stay (HOSPITAL_COMMUNITY)
Admission: RE | Admit: 2019-03-25 | Discharge: 2019-03-26 | DRG: 038 | Disposition: A | Discharge status: Home / Self Care | Payer: Medicaid Other | Source: Ambulatory Visit | Attending: Interventional Radiology | Admitting: Interventional Radiology

## 2019-03-25 ENCOUNTER — Encounter (HOSPITAL_COMMUNITY)
Admission: RE | Disposition: A | Discharge status: Home / Self Care | Payer: Self-pay | Source: Ambulatory Visit | Attending: Interventional Radiology

## 2019-03-25 ENCOUNTER — Other Ambulatory Visit: Payer: Self-pay

## 2019-03-25 ENCOUNTER — Ambulatory Visit (HOSPITAL_COMMUNITY)
Admission: RE | Admit: 2019-03-25 | Discharge: 2019-03-25 | Disposition: A | Discharge status: Home / Self Care | Payer: Medicaid Other | Source: Ambulatory Visit | Attending: Interventional Radiology | Admitting: Interventional Radiology

## 2019-03-25 DIAGNOSIS — Z833 Family history of diabetes mellitus: Secondary | ICD-10-CM | POA: Diagnosis not present

## 2019-03-25 DIAGNOSIS — Z20828 Contact with and (suspected) exposure to other viral communicable diseases: Secondary | ICD-10-CM | POA: Diagnosis present

## 2019-03-25 DIAGNOSIS — Z8249 Family history of ischemic heart disease and other diseases of the circulatory system: Secondary | ICD-10-CM | POA: Diagnosis not present

## 2019-03-25 DIAGNOSIS — E1100 Type 2 diabetes mellitus with hyperosmolarity without nonketotic hyperglycemic-hyperosmolar coma (NKHHC): Secondary | ICD-10-CM

## 2019-03-25 DIAGNOSIS — Z7952 Long term (current) use of systemic steroids: Secondary | ICD-10-CM

## 2019-03-25 DIAGNOSIS — Z885 Allergy status to narcotic agent status: Secondary | ICD-10-CM

## 2019-03-25 DIAGNOSIS — K219 Gastro-esophageal reflux disease without esophagitis: Secondary | ICD-10-CM | POA: Diagnosis present

## 2019-03-25 DIAGNOSIS — Z7984 Long term (current) use of oral hypoglycemic drugs: Secondary | ICD-10-CM

## 2019-03-25 DIAGNOSIS — E119 Type 2 diabetes mellitus without complications: Secondary | ICD-10-CM | POA: Diagnosis present

## 2019-03-25 DIAGNOSIS — Z888 Allergy status to other drugs, medicaments and biological substances status: Secondary | ICD-10-CM | POA: Diagnosis not present

## 2019-03-25 DIAGNOSIS — I6509 Occlusion and stenosis of unspecified vertebral artery: Secondary | ICD-10-CM

## 2019-03-25 DIAGNOSIS — M545 Low back pain: Secondary | ICD-10-CM | POA: Diagnosis present

## 2019-03-25 DIAGNOSIS — Z7982 Long term (current) use of aspirin: Secondary | ICD-10-CM

## 2019-03-25 DIAGNOSIS — R569 Unspecified convulsions: Secondary | ICD-10-CM | POA: Diagnosis present

## 2019-03-25 DIAGNOSIS — F319 Bipolar disorder, unspecified: Secondary | ICD-10-CM | POA: Diagnosis present

## 2019-03-25 DIAGNOSIS — I69354 Hemiplegia and hemiparesis following cerebral infarction affecting left non-dominant side: Secondary | ICD-10-CM

## 2019-03-25 DIAGNOSIS — F419 Anxiety disorder, unspecified: Secondary | ICD-10-CM | POA: Diagnosis present

## 2019-03-25 DIAGNOSIS — I6502 Occlusion and stenosis of left vertebral artery: Secondary | ICD-10-CM | POA: Diagnosis present

## 2019-03-25 DIAGNOSIS — G8929 Other chronic pain: Secondary | ICD-10-CM | POA: Diagnosis present

## 2019-03-25 DIAGNOSIS — Z87891 Personal history of nicotine dependence: Secondary | ICD-10-CM

## 2019-03-25 DIAGNOSIS — Z7901 Long term (current) use of anticoagulants: Secondary | ICD-10-CM

## 2019-03-25 DIAGNOSIS — Z91041 Radiographic dye allergy status: Secondary | ICD-10-CM | POA: Diagnosis not present

## 2019-03-25 DIAGNOSIS — Z79899 Other long term (current) drug therapy: Secondary | ICD-10-CM

## 2019-03-25 DIAGNOSIS — I69328 Other speech and language deficits following cerebral infarction: Secondary | ICD-10-CM

## 2019-03-25 DIAGNOSIS — I10 Essential (primary) hypertension: Secondary | ICD-10-CM | POA: Diagnosis present

## 2019-03-25 DIAGNOSIS — M79672 Pain in left foot: Secondary | ICD-10-CM | POA: Diagnosis present

## 2019-03-25 DIAGNOSIS — I6523 Occlusion and stenosis of bilateral carotid arteries: Secondary | ICD-10-CM | POA: Diagnosis present

## 2019-03-25 HISTORY — PX: RADIOLOGY WITH ANESTHESIA: SHX6223

## 2019-03-25 HISTORY — PX: IR TRANSCATH EXCRAN VERT OR CAR A STENT: IMG1955

## 2019-03-25 HISTORY — DX: Diverticulitis of intestine, part unspecified, without perforation or abscess without bleeding: K57.92

## 2019-03-25 HISTORY — DX: Gastro-esophageal reflux disease without esophagitis: K21.9

## 2019-03-25 LAB — URINALYSIS, COMPLETE (UACMP) WITH MICROSCOPIC
Bacteria, UA: NONE SEEN
Bilirubin Urine: NEGATIVE
Glucose, UA: >=500 mg/dL — AB
Ketones, ur: NEGATIVE mg/dL
Leukocytes,Ua: NEGATIVE
Nitrite: NEGATIVE
Protein, ur: NEGATIVE mg/dL
Specific Gravity, Urine: 1.032 — ABNORMAL HIGH (ref 1.005–1.030)
pH: 5 (ref 5.0–8.0)

## 2019-03-25 LAB — CBC WITH DIFFERENTIAL/PLATELET
Abs Immature Granulocytes: 0.07 10*3/uL (ref 0.00–0.07)
Basophils Absolute: 0 10*3/uL (ref 0.0–0.1)
Basophils Relative: 0 %
Eosinophils Absolute: 0 10*3/uL (ref 0.0–0.5)
Eosinophils Relative: 0 %
HCT: 36.9 % (ref 36.0–46.0)
Hemoglobin: 11.7 g/dL — ABNORMAL LOW (ref 12.0–15.0)
Immature Granulocytes: 1 %
Lymphocytes Relative: 9 %
Lymphs Abs: 1 10*3/uL (ref 0.7–4.0)
MCH: 26.9 pg (ref 26.0–34.0)
MCHC: 31.7 g/dL (ref 30.0–36.0)
MCV: 84.8 fL (ref 80.0–100.0)
Monocytes Absolute: 0.1 10*3/uL (ref 0.1–1.0)
Monocytes Relative: 1 %
Neutro Abs: 10.2 10*3/uL — ABNORMAL HIGH (ref 1.7–7.7)
Neutrophils Relative %: 89 %
Platelets: 222 10*3/uL (ref 150–400)
RBC: 4.35 MIL/uL (ref 3.87–5.11)
RDW: 14.5 % (ref 11.5–15.5)
WBC: 11.4 10*3/uL — ABNORMAL HIGH (ref 4.0–10.5)
nRBC: 0 % (ref 0.0–0.2)

## 2019-03-25 LAB — BASIC METABOLIC PANEL
Anion gap: 15 (ref 5–15)
BUN: 21 mg/dL — ABNORMAL HIGH (ref 6–20)
CO2: 19 mmol/L — ABNORMAL LOW (ref 22–32)
Calcium: 9.4 mg/dL (ref 8.9–10.3)
Chloride: 102 mmol/L (ref 98–111)
Creatinine, Ser: 1.03 mg/dL — ABNORMAL HIGH (ref 0.44–1.00)
GFR calc Af Amer: >60 mL/min (ref >60)
GFR calc non Af Amer: >60 mL/min (ref >60)
Glucose, Bld: 321 mg/dL — ABNORMAL HIGH (ref 70–99)
Potassium: 4.4 mmol/L (ref 3.5–5.1)
Sodium: 136 mmol/L (ref 135–145)

## 2019-03-25 LAB — APTT: aPTT: 25 seconds (ref 24–36)

## 2019-03-25 LAB — POCT ACTIVATED CLOTTING TIME
Activated Clotting Time: 180 seconds
Activated Clotting Time: 208 seconds

## 2019-03-25 LAB — GLUCOSE, CAPILLARY
Glucose-Capillary: 297 mg/dL — ABNORMAL HIGH (ref 70–99)
Glucose-Capillary: 300 mg/dL — ABNORMAL HIGH (ref 70–99)
Glucose-Capillary: 249 mg/dL — ABNORMAL HIGH (ref 70–99)
Glucose-Capillary: 178 mg/dL — ABNORMAL HIGH (ref 70–99)
Glucose-Capillary: 186 mg/dL — ABNORMAL HIGH (ref 70–99)

## 2019-03-25 LAB — HEPARIN LEVEL (UNFRACTIONATED): Heparin Unfractionated: <0.1 IU/mL — ABNORMAL LOW (ref 0.30–0.70)

## 2019-03-25 LAB — PROTIME-INR
INR: 1.1 (ref 0.8–1.2)
Prothrombin Time: 13.8 seconds (ref 11.4–15.2)

## 2019-03-25 LAB — PLATELET INHIBITION P2Y12: Platelet Function  P2Y12: 5 [PRU] — ABNORMAL LOW (ref 182–335)

## 2019-03-25 LAB — HCG, SERUM, QUALITATIVE: Preg, Serum: NEGATIVE

## 2019-03-25 SURGERY — RADIOLOGY WITH ANESTHESIA
Anesthesia: General

## 2019-03-25 MED ORDER — DULOXETINE HCL 30 MG PO CPEP
30.0000 mg | ORAL_CAPSULE | Freq: Every day | ORAL | Status: DC
  Administered 2019-03-25 – 2019-03-26 (×2): 30 mg via ORAL
  Filled 2019-03-25 (×3): qty 1

## 2019-03-25 MED ORDER — PHENYLEPHRINE 40 MCG/ML (10ML) SYRINGE FOR IV PUSH (FOR BLOOD PRESSURE SUPPORT)
PREFILLED_SYRINGE | INTRAVENOUS | Status: DC | PRN
  Administered 2019-03-25: 40 ug via INTRAVENOUS
  Administered 2019-03-25: 80 ug via INTRAVENOUS
  Administered 2019-03-25: 120 ug via INTRAVENOUS

## 2019-03-25 MED ORDER — RISPERIDONE 0.5 MG PO TABS
0.5000 mg | ORAL_TABLET | Freq: Every day | ORAL | Status: DC
  Administered 2019-03-25: 0.5 mg via ORAL
  Filled 2019-03-25 (×2): qty 1

## 2019-03-25 MED ORDER — ROCURONIUM BROMIDE 10 MG/ML (PF) SYRINGE
PREFILLED_SYRINGE | INTRAVENOUS | Status: DC | PRN
  Administered 2019-03-25: 120 mg via INTRAVENOUS

## 2019-03-25 MED ORDER — HYDROCODONE-ACETAMINOPHEN 5-325 MG PO TABS
1.0000 | ORAL_TABLET | ORAL | Status: DC | PRN
  Administered 2019-03-25 – 2019-03-26 (×5): 1 via ORAL
  Filled 2019-03-25 (×5): qty 1

## 2019-03-25 MED ORDER — NIMODIPINE 30 MG PO CAPS
0.0000 mg | ORAL_CAPSULE | ORAL | Status: DC
  Filled 2019-03-25: qty 2

## 2019-03-25 MED ORDER — ACETAMINOPHEN 650 MG RE SUPP: 650.0000 mg | RECTAL | Status: DC | PRN

## 2019-03-25 MED ORDER — PROTAMINE SULFATE 10 MG/ML IV SOLN
INTRAVENOUS | Status: DC | PRN
  Administered 2019-03-25: 5 mg via INTRAVENOUS

## 2019-03-25 MED ORDER — LISINOPRIL 40 MG PO TABS
40.0000 mg | ORAL_TABLET | Freq: Every day | ORAL | Status: DC
  Administered 2019-03-26: 40 mg via ORAL
  Filled 2019-03-25: qty 1
  Filled 2019-03-25: qty 2

## 2019-03-25 MED ORDER — FENTANYL CITRATE (PF) 100 MCG/2ML IJ SOLN
25.0000 ug | INTRAMUSCULAR | Status: DC | PRN
  Administered 2019-03-25 (×2): 50 ug via INTRAVENOUS

## 2019-03-25 MED ORDER — DOXYCYCLINE HYCLATE 100 MG PO TABS
100.0000 mg | ORAL_TABLET | Freq: Two times a day (BID) | ORAL | Status: DC
  Administered 2019-03-25 – 2019-03-26 (×2): 100 mg via ORAL
  Filled 2019-03-25 (×3): qty 1

## 2019-03-25 MED ORDER — CLEVIDIPINE BUTYRATE 0.5 MG/ML IV EMUL
0.0000 mg/h | INTRAVENOUS | Status: DC
  Administered 2019-03-25: 13:00:00 1 mg/h via INTRAVENOUS
  Filled 2019-03-25: qty 50

## 2019-03-25 MED ORDER — INSULIN ASPART 100 UNIT/ML ~~LOC~~ SOLN
0.0000 [IU] | Freq: Three times a day (TID) | SUBCUTANEOUS | Status: DC
  Administered 2019-03-25 – 2019-03-26 (×2): 3 [IU] via SUBCUTANEOUS
  Administered 2019-03-26: 5 [IU] via SUBCUTANEOUS

## 2019-03-25 MED ORDER — GABAPENTIN 300 MG PO CAPS
900.0000 mg | ORAL_CAPSULE | Freq: Two times a day (BID) | ORAL | Status: DC
  Administered 2019-03-25 – 2019-03-26 (×2): 900 mg via ORAL
  Filled 2019-03-25 (×3): qty 3

## 2019-03-25 MED ORDER — CLOPIDOGREL BISULFATE 75 MG PO TABS: 75.0000 mg | ORAL_TABLET | Freq: Every day | ORAL | Status: DC

## 2019-03-25 MED ORDER — SUGAMMADEX SODIUM 200 MG/2ML IV SOLN
INTRAVENOUS | Status: DC | PRN
  Administered 2019-03-25: 195 mg via INTRAVENOUS

## 2019-03-25 MED ORDER — LABETALOL HCL 300 MG PO TABS
300.0000 mg | ORAL_TABLET | Freq: Two times a day (BID) | ORAL | Status: DC
  Administered 2019-03-26: 300 mg via ORAL
  Filled 2019-03-25 (×2): qty 1

## 2019-03-25 MED ORDER — TRAZODONE HCL 100 MG PO TABS
200.0000 mg | ORAL_TABLET | Freq: Every day | ORAL | Status: DC
  Administered 2019-03-25: 200 mg via ORAL
  Filled 2019-03-25 (×2): qty 2

## 2019-03-25 MED ORDER — CANAGLIFLOZIN 100 MG PO TABS: 100.0000 mg | ORAL_TABLET | Freq: Every day | ORAL | Status: DC

## 2019-03-25 MED ORDER — ACETAMINOPHEN 160 MG/5ML PO SOLN: 650.0000 mg | ORAL | Status: DC | PRN

## 2019-03-25 MED ORDER — PANTOPRAZOLE SODIUM 40 MG PO TBEC
40.0000 mg | DELAYED_RELEASE_TABLET | Freq: Every day | ORAL | Status: DC
  Administered 2019-03-25 – 2019-03-26 (×2): 40 mg via ORAL
  Filled 2019-03-25 (×3): qty 1

## 2019-03-25 MED ORDER — HEPARIN (PORCINE) 25000 UT/250ML-% IV SOLN
500.0000 [IU]/h | INTRAVENOUS | Status: DC
  Administered 2019-03-25: 500 [IU]/h via INTRAVENOUS
  Filled 2019-03-25: qty 250

## 2019-03-25 MED ORDER — ATORVASTATIN CALCIUM 40 MG PO TABS
40.0000 mg | ORAL_TABLET | Freq: Every day | ORAL | Status: DC
  Administered 2019-03-26: 40 mg via ORAL
  Filled 2019-03-25: qty 1

## 2019-03-25 MED ORDER — GABAPENTIN 300 MG PO CAPS: 900.0000 mg | ORAL_CAPSULE | ORAL | Status: DC

## 2019-03-25 MED ORDER — CLEVIDIPINE BUTYRATE 0.5 MG/ML IV EMUL
INTRAVENOUS | Status: AC
  Administered 2019-03-25: 1 mg/h via INTRAVENOUS
  Filled 2019-03-25: qty 50

## 2019-03-25 MED ORDER — ASPIRIN 81 MG PO CHEW: 81.0000 mg | CHEWABLE_TABLET | Freq: Every day | ORAL | Status: DC

## 2019-03-25 MED ORDER — INSULIN ASPART 100 UNIT/ML ~~LOC~~ SOLN
20.0000 [IU] | Freq: Once | SUBCUTANEOUS | Status: AC
  Administered 2019-03-25: 07:00:00 20 [IU] via SUBCUTANEOUS

## 2019-03-25 MED ORDER — CLOPIDOGREL BISULFATE 75 MG PO TABS
75.0000 mg | ORAL_TABLET | ORAL | Status: DC
  Filled 2019-03-25: qty 1

## 2019-03-25 MED ORDER — IBUPROFEN 200 MG PO TABS
600.0000 mg | ORAL_TABLET | Freq: Two times a day (BID) | ORAL | Status: DC
  Administered 2019-03-25 – 2019-03-26 (×3): 600 mg via ORAL
  Filled 2019-03-25: qty 1
  Filled 2019-03-25 (×2): qty 3

## 2019-03-25 MED ORDER — ONDANSETRON HCL 4 MG/2ML IJ SOLN
INTRAMUSCULAR | Status: DC | PRN
  Administered 2019-03-25: 4 mg via INTRAVENOUS

## 2019-03-25 MED ORDER — CEFAZOLIN SODIUM-DEXTROSE 2-4 GM/100ML-% IV SOLN
2.0000 g | INTRAVENOUS | Status: AC
  Administered 2019-03-25: 2 g via INTRAVENOUS
  Filled 2019-03-25: qty 100

## 2019-03-25 MED ORDER — ASPIRIN EC 325 MG PO TBEC
325.0000 mg | DELAYED_RELEASE_TABLET | ORAL | Status: DC
  Filled 2019-03-25: qty 1

## 2019-03-25 MED ORDER — DIAZEPAM 5 MG PO TABS
5.0000 mg | ORAL_TABLET | Freq: Two times a day (BID) | ORAL | Status: DC
  Administered 2019-03-25 – 2019-03-26 (×2): 5 mg via ORAL
  Filled 2019-03-25 (×2): qty 1

## 2019-03-25 MED ORDER — HEPARIN (PORCINE) 25000 UT/250ML-% IV SOLN: 900.0000 [IU]/h | INTRAVENOUS | Status: AC

## 2019-03-25 MED ORDER — IOHEXOL 300 MG/ML  SOLN
150.0000 mL | Freq: Once | INTRAMUSCULAR | Status: AC | PRN
  Administered 2019-03-25: 25 mL via INTRA_ARTERIAL

## 2019-03-25 MED ORDER — GABAPENTIN 600 MG PO TABS
1200.0000 mg | ORAL_TABLET | Freq: Every day | ORAL | Status: DC
  Administered 2019-03-25: 1200 mg via ORAL
  Filled 2019-03-25: qty 2
  Filled 2019-03-25: qty 1.5

## 2019-03-25 MED ORDER — CEFAZOLIN SODIUM-DEXTROSE 2-4 GM/100ML-% IV SOLN
INTRAVENOUS | Status: AC
  Filled 2019-03-25: qty 100

## 2019-03-25 MED ORDER — HEPARIN SODIUM (PORCINE) 1000 UNIT/ML IJ SOLN
INTRAMUSCULAR | Status: DC | PRN
  Administered 2019-03-25: 3000 [IU] via INTRAVENOUS
  Administered 2019-03-25 (×2): 500 [IU] via INTRAVENOUS

## 2019-03-25 MED ORDER — PROPOFOL 10 MG/ML IV BOLUS
INTRAVENOUS | Status: DC | PRN
  Administered 2019-03-25: 180 mg via INTRAVENOUS

## 2019-03-25 MED ORDER — HEPARIN (PORCINE) 25000 UT/250ML-% IV SOLN
750.0000 [IU]/h | INTRAVENOUS | Status: DC
  Administered 2019-03-25: 750 [IU]/h via INTRAVENOUS

## 2019-03-25 MED ORDER — ONDANSETRON HCL 4 MG/2ML IJ SOLN: 4.0000 mg | Freq: Four times a day (QID) | INTRAMUSCULAR | Status: DC | PRN

## 2019-03-25 MED ORDER — SODIUM CHLORIDE 0.9 % IV SOLN
INTRAVENOUS | Status: DC
  Administered 2019-03-25: 14:00:00 via INTRAVENOUS

## 2019-03-25 MED ORDER — LEVETIRACETAM ER 500 MG PO TB24
1000.0000 mg | ORAL_TABLET | Freq: Every day | ORAL | Status: DC
  Administered 2019-03-25 – 2019-03-26 (×2): 1000 mg via ORAL
  Filled 2019-03-25 (×3): qty 2

## 2019-03-25 MED ORDER — SODIUM CHLORIDE 0.9 % IV SOLN
INTRAVENOUS | Status: DC
  Administered 2019-03-25 (×2): via INTRAVENOUS

## 2019-03-25 MED ORDER — FENTANYL CITRATE (PF) 100 MCG/2ML IJ SOLN
INTRAMUSCULAR | Status: AC
  Administered 2019-03-25: 50 ug via INTRAVENOUS
  Filled 2019-03-25: qty 2

## 2019-03-25 MED ORDER — LIDOCAINE 2% (20 MG/ML) 5 ML SYRINGE
INTRAMUSCULAR | Status: DC | PRN
  Administered 2019-03-25: 80 mg via INTRAVENOUS

## 2019-03-25 MED ORDER — FENTANYL CITRATE (PF) 100 MCG/2ML IJ SOLN
INTRAMUSCULAR | Status: AC
  Filled 2019-03-25: qty 2

## 2019-03-25 MED ORDER — INSULIN ASPART 100 UNIT/ML ~~LOC~~ SOLN
SUBCUTANEOUS | Status: AC
  Administered 2019-03-25: 20 [IU] via SUBCUTANEOUS
  Filled 2019-03-25: qty 1

## 2019-03-25 MED ORDER — NITROGLYCERIN 1 MG/10 ML FOR IR/CATH LAB
INTRA_ARTERIAL | Status: AC
  Filled 2019-03-25: qty 10

## 2019-03-25 MED ORDER — CHLORHEXIDINE GLUCONATE CLOTH 2 % EX PADS
6.0000 | MEDICATED_PAD | Freq: Every day | CUTANEOUS | Status: DC
  Administered 2019-03-25 – 2019-03-26 (×2): 6 via TOPICAL

## 2019-03-25 MED ORDER — ACETAMINOPHEN 325 MG PO TABS
650.0000 mg | ORAL_TABLET | ORAL | Status: DC | PRN
  Filled 2019-03-25: qty 2

## 2019-03-25 MED ORDER — IOHEXOL 300 MG/ML  SOLN
150.0000 mL | Freq: Once | INTRAMUSCULAR | Status: AC | PRN
  Administered 2019-03-25: 75 mL via INTRA_ARTERIAL

## 2019-03-25 MED ORDER — FENTANYL CITRATE (PF) 250 MCG/5ML IJ SOLN
INTRAMUSCULAR | Status: DC | PRN
  Administered 2019-03-25 (×2): 25 ug via INTRAVENOUS

## 2019-03-25 MED ORDER — CLOPIDOGREL BISULFATE 75 MG PO TABS
75.0000 mg | ORAL_TABLET | Freq: Every day | ORAL | Status: DC
  Administered 2019-03-26: 75 mg via ORAL
  Filled 2019-03-25: qty 1

## 2019-03-25 MED ORDER — ASPIRIN 81 MG PO CHEW
81.0000 mg | CHEWABLE_TABLET | Freq: Every day | ORAL | Status: DC
  Administered 2019-03-26: 81 mg via ORAL
  Filled 2019-03-25: qty 1

## 2019-03-25 NOTE — Progress Notes (Signed)
ANTICOAGULATION CONSULT NOTE   Pharmacy Consult for Heparin Indication: s/p cerebral arteriogram with revascularization  Allergies  Allergen Reactions  . Contrast Media [Iodinated Diagnostic Agents] Other (See Comments)    Went into kidney failure   . Amitriptyline Other (See Comments)    Unresponsive   . Compazine Other (See Comments)    Unknown  . Oxycodone Nausea And Vomiting    Severe patient will not take oxycodone or percocet.  . Prochlorperazine Other (See Comments)    Alert but unable to move  . Promethazine Swelling    Lips swell  . Sumatriptan Rash    Patient Measurements: Height: 5\' 5"  (165.1 cm) Weight: 215 lb (97.5 kg) IBW/kg (Calculated) : 57 Heparin Dosing Weight: 80 kg  Vital Signs: Temp: 97.9 F (36.6 C) (10/12 2000) Temp Source: Oral (10/12 2000) BP: 112/64 (10/12 2100) Pulse Rate: 91 (10/12 2100)  Labs: Recent Labs    03/25/19 0646 03/25/19 2100  HGB 11.7*  --   HCT 36.9  --   PLT 222  --   APTT 25  --   LABPROT 13.8  --   INR 1.1  --   HEPARINUNFRC  --  <0.10*  CREATININE 1.03*  --     Estimated Creatinine Clearance: 84.7 mL/min (A) (by C-G formula based on SCr of 1.03 mg/dL (H)).   Medical History: Past Medical History:  Diagnosis Date  . Anxiety   . ARF (acute renal failure) ([place]) 2013  . Bipolar disorder ([place])   . Cerebral artery occlusion with cerebral infarction ([place]) 11/13   due to elevated blood sugar 1700-off insulin 6 months  . Depression   . Diabetes mellitus    off insulin 6 months  . Diverticulitis   . Dyspnea    when walking  . GERD (gastroesophageal reflux disease)   . Headache(784.0)   . Hypertension   . Kidney dialysis status    on dialysis(stroke) off after 4-6 weeks  . Pneumonia 2013, 06/2018  . Respiratory failure ([place]) 11/13   with stroke - in hosp [facility] 12 weeks  . Seizures ([place])    during elvated blood sugsr episode 11/13  . Sepsis ([place])   . Stroke ([employer])    11/2018, 12/2018 Weak left side, speech-  slurred, Short term memeroy, Gait unsteady   . Vocal cord dysfunction     Medications:  Medications Prior to Admission  Medication Sig Dispense Refill Last Dose  . aspirin EC 325 MG tablet Take 325 mg by mouth daily.    03/25/2019 at 0415  . atorvastatin (LIPITOR) 40 MG tablet Take 40 mg by mouth daily.   03/25/2019 at 0415  . clopidogrel (PLAVIX) 75 MG tablet Take 1 tablet (75 mg total) by mouth daily. 30 tablet 5 03/25/2019 at 0415  . dapagliflozin propanediol (FARXIGA) 10 MG TABS tablet Take 10 mg by mouth every morning.    03/23/2019 at Unknown time  . diazepam (VALIUM) 5 MG tablet Take 5 mg by mouth 2 (two) times daily.    03/25/2019 at 0415  . diphenhydrAMINE (BENADRYL) 25 MG tablet Take 50 mg by mouth See admin instructions. Take 50mg  by mouth one hour prior to procedure   03/25/2019 at 0704  . doxycycline (MONODOX) 100 MG capsule Take 100 mg by mouth 2 (two) times daily.   03/24/2019 at Unknown time  . DULoxetine (CYMBALTA) 30 MG capsule Take 30 mg by mouth daily.   03/25/2019 at 0415  . gabapentin (NEURONTIN) 300 MG capsule Take 3 tablets in  the morning, 3 tab in the afternoon, and 4 tablets at bedtime. (Patient taking differently: Take 900-1,200 mg by mouth See admin instructions. Take 3 tablets in the morning, 3 tab in the afternoon, and 4 tablets at bedtime.) 300 capsule 5 03/25/2019 at 0415  . halobetasol (ULTRAVATE) 0.05 % cream Apply topically 2 (two) times daily.   03/24/2019 at Unknown time  . HYDROcodone-acetaminophen (NORCO/VICODIN) 5-325 MG tablet Take 1 tablet by mouth every 4 (four) hours as needed for moderate pain.   03/25/2019 at 0415  . labetalol (NORMODYNE) 300 MG tablet Take 300 mg by mouth 2 (two) times daily.   03/25/2019 at 0415  . levETIRAcetam (KEPPRA XR) 500 MG 24 hr tablet Take 1,000 mg by mouth daily.   03/25/2019 at 0415  . lisinopril (ZESTRIL) 40 MG tablet Take 40 mg by mouth daily.    03/24/2019 at Unknown time  . metFORMIN (GLUCOPHAGE) 500 MG tablet Take  500 mg by mouth 2 (two) times daily.   03/24/2019 at Unknown time  . omeprazole (PRILOSEC) 20 MG capsule Take 20 mg by mouth daily.   03/25/2019 at 0415  . predniSONE (DELTASONE) 50 MG tablet Take 50 mg by mouth See admin instructions. Take one tablet by mouth 13 hours, 7 hours, and 1 hour before procedure   03/25/2019 at 0704  . risperiDONE (RISPERDAL) 0.5 MG tablet Take 0.5 mg by mouth at bedtime.   03/24/2019 at Unknown time  . traZODone (DESYREL) 100 MG tablet Take 200 mg by mouth at bedtime.    03/24/2019 at Unknown time  . acetaminophen (TYLENOL) 325 MG tablet Take 650 mg by mouth every 6 (six) hours as needed. pain   More than a month at Unknown time  . ibuprofen (ADVIL) 600 MG tablet Take 600 mg by mouth 2 (two) times daily.    03/20/2019    Assessment: 38 yo F s/p cerebral arteriogram with revascularization to continue on heparin overnight.  Initial heparin level is low, no issues per RN  Goal of Therapy:  Heparin level 0.1-0.25 units/ml Monitor platelets by anticoagulation protocol: Yes   Plan:  Inc heparin to 900 units/hr Heparin off at 0700 10/3  Narda Bonds, PharmD, [place] Pharmacist Phone: [phone]

## 2019-03-25 NOTE — Progress Notes (Signed)
Inpatient Diabetes Program Recommendations  AACE/ADA: New Consensus Statement on Inpatient Glycemic Control (2015)  Target Ranges:  Prepandial:   less than 140 mg/dL      Peak postprandial:   less than 180 mg/dL (1-2 hours)      Critically ill patients:  140 - 180 mg/dL   Lab Results  Component Value Date   GLUCAP 178 (H) 03/25/2019    Review of Glycemic Control  Diabetes history: DM2 Outpatient Diabetes medications: Invokana 100 mg QD, Farxiga 10 mg QD, metformin 500 mg bid Current orders for Inpatient glycemic control: Invokana 100 mg QD   Inpatient Diabetes Program Recommendations:     Novolog 0-15 units Q4H if NPO or tidwc and 0-5 units QHS if eating.  Follow while inpatient.  Thank you. Lorenda Peck, RD, LDN, CDE Inpatient Diabetes Coordinator [phone]

## 2019-03-25 NOTE — Transfer of Care (Signed)
Immediate Anesthesia Transfer of Care Note  Patient: Taylor [name]  Procedure(s) Performed: RADIOLOGY WITH ANESTHESIA STENTING (N/A )  Patient Location: PACU  Anesthesia Type:General  Level of Consciousness: drowsy and patient cooperative  Airway & Oxygen Therapy: Patient Spontanous Breathing and Patient connected to face mask oxygen  Post-op Assessment: Report given to RN and Post -op Vital signs reviewed and stable  Post vital signs: Reviewed and stable  Last Vitals:  Vitals Value Taken Time  BP 108/76 03/25/19 1229  Temp    Pulse 92 03/25/19 1229  Resp 15 03/25/19 1229  SpO2 99 % 03/25/19 1229  Vitals shown include unvalidated device data.  Last Pain:  Vitals:   03/25/19 0658  TempSrc: Oral  PainSc: 6          Complications: No apparent anesthesia complications

## 2019-03-25 NOTE — Progress Notes (Signed)
NIR.  Patient underwent an image-guided cerebral arteriogram with revascularization of left VA stenosis using stent assisted angioplasty this AM by Dr. Estanislado Pandy.  Patient awake and alert laying in bed. Complains of mild headache, stable. Complains of low back pain, chronic and stable. Denies weakness, numbness/tingling, dizziness, vision changes, hearing changes, tinnitus, or speech difficulty.  Alert, awake, and oriented x3. Speech and comprehension intact. PERRL bilaterally. EOMs intact bilaterally without nystagmus or subjective diplopia. No facial asymmetry. Tongue midline. Motor power symmetric proportional to effort. No pronator drift. Fine motor and coordination intact and symmetric. Distal pulses palpable bilaterally with Doppler. Right groin incision soft without active bleeding or hematoma.  Dr. Estanislado Pandy called patient's mother, Taylor [name], and updated her on procedure.  Plan to transfer to neuro ICU for overnight observation. Advance diet from clear liquids to carb modified. Continue taking Plavix 75 mg once daily and Aspirin 81 mg once daily. NIR to follow.   Taylor Graff Elvie Maines, PA-C 03/25/2019, 3:26 PM

## 2019-03-25 NOTE — Anesthesia Procedure Notes (Signed)
Arterial Line Insertion Start/End10/05/2019 10:30 AM, 03/25/2019 10:30 AM Performed by: Marsa Aris, CRNA, CRNA  Patient location: Pre-op. Preanesthetic checklist: patient identified, IV checked, site marked, risks and benefits discussed, surgical consent, monitors and equipment checked, pre-op evaluation, timeout performed and anesthesia consent Lidocaine 1% used for infiltration Right, radial was placed Catheter size: 20 Fr Hand hygiene performed  and maximum sterile barriers used   Attempts: 2 Procedure performed without using ultrasound guided technique. Following insertion, dressing applied and Biopatch. Post procedure assessment: normal and unchanged  Post procedure complications: local hematoma. Patient tolerated the procedure well with no immediate complications.

## 2019-03-25 NOTE — Procedures (Signed)
S/P LT VA angiogram followed by stent assisted angioplasty of severely symptomatic stenosis at origin of dominant LT VA. S.Rowdy Guerrini MD

## 2019-03-25 NOTE — Sedation Documentation (Signed)
Right groin sheath removed, manual pressure being held at groin site by IR tech.

## 2019-03-25 NOTE — Anesthesia Procedure Notes (Signed)
Procedure Name: Intubation Date/Time: 03/25/2019 10:29 AM Performed by: Kathryne Hitch, CRNA Pre-anesthesia Checklist: Patient identified, Emergency Drugs available, Suction available and Patient being monitored Patient Re-evaluated:Patient Re-evaluated prior to induction Oxygen Delivery Method: Circle system utilized Preoxygenation: Pre-oxygenation with 100% oxygen Induction Type: IV induction Ventilation: Two handed mask ventilation required and Mask ventilation without difficulty Laryngoscope Size: Miller and 2 Grade View: Grade I Tube type: Oral Tube size: 7.0 mm Number of attempts: 1 Airway Equipment and Method: Stylet and Oral airway Placement Confirmation: ETT inserted through vocal cords under direct vision,  positive ETCO2 and breath sounds checked- equal and bilateral Secured at: 21 cm Tube secured with: Tape Dental Injury: Teeth and Oropharynx as per pre-operative assessment

## 2019-03-25 NOTE — Progress Notes (Signed)
ANTICOAGULATION CONSULT NOTE - Initial Consult  Pharmacy Consult for heparin Indication: s/p cerebral arteriogram with revascularization  Allergies  Allergen Reactions  . Contrast Media [Iodinated Diagnostic Agents] Other (See Comments)    Went into kidney failure   . Amitriptyline Other (See Comments)    Unresponsive   . Compazine Other (See Comments)    Unknown  . Oxycodone Nausea And Vomiting    Severe patient will not take oxycodone or percocet.  . Prochlorperazine Other (See Comments)    Alert but unable to move  . Promethazine Swelling    Lips swell  . Sumatriptan Rash    Patient Measurements: Height: 5\' 5"  (165.1 cm) Weight: 215 lb (97.5 kg) IBW/kg (Calculated) : 57 Heparin Dosing Weight: 80 kg  Vital Signs: Temp: 97 F (36.1 C) (10/12 1500) Temp Source: Oral (10/12 0658) BP: 140/99 (10/12 1515) Pulse Rate: 86 (10/12 1515)  Labs: Recent Labs    03/25/19 0646  HGB 11.7*  HCT 36.9  PLT 222  APTT 25  LABPROT 13.8  INR 1.1  CREATININE 1.03*    Estimated Creatinine Clearance: 84.7 mL/min (A) (by C-G formula based on SCr of 1.03 mg/dL (H)).   Medical History: Past Medical History:  Diagnosis Date  . Anxiety   . ARF (acute renal failure) ([place]) 2013  . Bipolar disorder ([place])   . Cerebral artery occlusion with cerebral infarction ([place]) 11/13   due to elevated blood sugar 1700-off insulin 6 months  . Depression   . Diabetes mellitus    off insulin 6 months  . Diverticulitis   . Dyspnea    when walking  . GERD (gastroesophageal reflux disease)   . Headache(784.0)   . Hypertension   . Kidney dialysis status    on dialysis(stroke) off after 4-6 weeks  . Pneumonia 2013, 06/2018  . Respiratory failure ([place]) 11/13   with stroke - in hosp [facility] 12 weeks  . Seizures ([place])    during elvated blood sugsr episode 11/13  . Sepsis ([place])   . Stroke ([place])    11/2018, 12/2018 Weak left side, speech- slurred, Short term memeroy, Gait unsteady   . Vocal cord  dysfunction     Medications:  Medications Prior to Admission  Medication Sig Dispense Refill Last Dose  . aspirin EC 325 MG tablet Take 325 mg by mouth daily.    03/25/2019 at 0415  . atorvastatin (LIPITOR) 40 MG tablet Take 40 mg by mouth daily.   03/25/2019 at 0415  . clopidogrel (PLAVIX) 75 MG tablet Take 1 tablet (75 mg total) by mouth daily. 30 tablet 5 03/25/2019 at 0415  . dapagliflozin propanediol (FARXIGA) 10 MG TABS tablet Take 10 mg by mouth every morning.    03/23/2019 at Unknown time  . diazepam (VALIUM) 5 MG tablet Take 5 mg by mouth 2 (two) times daily.    03/25/2019 at 0415  . diphenhydrAMINE (BENADRYL) 25 MG tablet Take 50 mg by mouth See admin instructions. Take 50mg  by mouth one hour prior to procedure   03/25/2019 at 0704  . doxycycline (MONODOX) 100 MG capsule Take 100 mg by mouth 2 (two) times daily.   03/24/2019 at Unknown time  . DULoxetine (CYMBALTA) 30 MG capsule Take 30 mg by mouth daily.   03/25/2019 at 0415  . gabapentin (NEURONTIN) 300 MG capsule Take 3 tablets in the morning, 3 tab in the afternoon, and 4 tablets at bedtime. (Patient taking differently: Take 900-1,200 mg by mouth See admin instructions. Take 3 tablets in  the morning, 3 tab in the afternoon, and 4 tablets at bedtime.) 300 capsule 5 03/25/2019 at 0415  . halobetasol (ULTRAVATE) 0.05 % cream Apply topically 2 (two) times daily.   03/24/2019 at Unknown time  . HYDROcodone-acetaminophen (NORCO/VICODIN) 5-325 MG tablet Take 1 tablet by mouth every 4 (four) hours as needed for moderate pain.   03/25/2019 at 0415  . labetalol (NORMODYNE) 300 MG tablet Take 300 mg by mouth 2 (two) times daily.   03/25/2019 at 0415  . levETIRAcetam (KEPPRA XR) 500 MG 24 hr tablet Take 1,000 mg by mouth daily.   03/25/2019 at 0415  . lisinopril (ZESTRIL) 40 MG tablet Take 40 mg by mouth daily.    03/24/2019 at Unknown time  . metFORMIN (GLUCOPHAGE) 500 MG tablet Take 500 mg by mouth 2 (two) times daily.   03/24/2019 at Unknown  time  . omeprazole (PRILOSEC) 20 MG capsule Take 20 mg by mouth daily.   03/25/2019 at 0415  . predniSONE (DELTASONE) 50 MG tablet Take 50 mg by mouth See admin instructions. Take one tablet by mouth 13 hours, 7 hours, and 1 hour before procedure   03/25/2019 at 0704  . risperiDONE (RISPERDAL) 0.5 MG tablet Take 0.5 mg by mouth at bedtime.   03/24/2019 at Unknown time  . traZODone (DESYREL) 100 MG tablet Take 200 mg by mouth at bedtime.    03/24/2019 at Unknown time  . acetaminophen (TYLENOL) 325 MG tablet Take 650 mg by mouth every 6 (six) hours as needed. pain   More than a month at Unknown time  . ibuprofen (ADVIL) 600 MG tablet Take 600 mg by mouth 2 (two) times daily.    03/20/2019    Assessment: 38 yo F s/p cerebral arteriogram with revascularization to continue on heparin overnight.  Goal of Therapy:  Heparin level 0.1-0.25 units/ml Monitor platelets by anticoagulation protocol: Yes   Plan:  Heparin started ~ 1300 at 500 units/hr in the PACU. Increase heparin to 750 units/hr. Check heparin level in 6 hours. Anticipate heparin stop time 10/13 at [place], Pharm.D., BCPS Clinical Pharmacist  **Pharmacist phone directory can now be found on [email or web address] ([email or web address]).  Listed under [place].  03/25/2019 4:04 PM

## 2019-03-25 NOTE — Progress Notes (Signed)
Patient ID: Taylor [name], female   DOB: 1981-06-01, 38 y.o.   MRN: [id number] INR. Post procedure extubated without difficulty. Maintaining O2 sat. OPens eyes to command. Moves all 4s slowly to command. Pupils  2 to 41mm reactive to light Dand C. . No facial asymmetry. RT groin soft. Distal pulses all dopplerable ,unchanged from prior to the procedure. S.Maston Wight MD

## 2019-03-25 NOTE — Progress Notes (Signed)
Elevated CBG, Dr. Kalman Shan made aware. Verbal orders received. Will continue to monitor.

## 2019-03-25 NOTE — Anesthesia Preprocedure Evaluation (Signed)
Anesthesia Evaluation  Patient identified by MRN, date of birth, ID band Patient awake    Reviewed: Allergy & Precautions, NPO status , Patient's Chart, lab work & pertinent test results  Airway Mallampati: II  TM Distance: >3 FB Neck ROM: Full    Dental no notable dental hx.    Pulmonary neg pulmonary ROS, former smoker,    Pulmonary exam normal breath sounds clear to auscultation       Cardiovascular hypertension, Normal cardiovascular exam Rhythm:Regular Rate:Normal     Neuro/Psych Bipolar Disorder CVA    GI/Hepatic negative GI ROS, Neg liver ROS,   Endo/Other  diabetes  Renal/GU negative Renal ROS  negative genitourinary   Musculoskeletal negative musculoskeletal ROS (+)   Abdominal   Peds negative pediatric ROS (+)  Hematology negative hematology ROS (+)   Anesthesia Other Findings   Reproductive/Obstetrics negative OB ROS                             Anesthesia Physical Anesthesia Plan  ASA: III  Anesthesia Plan: General   Post-op Pain Management:    Induction: Intravenous  PONV Risk Score and Plan: 3 and Ondansetron, Dexamethasone and Treatment may vary due to age or medical condition  Airway Management Planned: Oral ETT  Additional Equipment:   Intra-op Plan:   Post-operative Plan: Extubation in OR  Informed Consent: I have reviewed the patients History and Physical, chart, labs and discussed the procedure including the risks, benefits and alternatives for the proposed anesthesia with the patient or authorized representative who has indicated his/her understanding and acceptance.     Dental advisory given  Plan Discussed with: CRNA and Surgeon  Anesthesia Plan Comments:         Anesthesia Quick Evaluation

## 2019-03-25 NOTE — Progress Notes (Signed)
CBG rechecked, results reported to Dr. Kalman Shan. No orders received. Will continue to monitor.

## 2019-03-25 NOTE — H&P (Signed)
Chief Complaint: Patient was seen in consultation today for left VA stenosis/revascularization.  Referring Physician(s): Narda Amber  Supervising Physician: Luanne Bras  Patient Status: [facility] - Out-pt  History of Present Illness: Taylor [name] is a 38 y.o. female with a past medical history of hypertension, CVA 11/2018 and 12/2018, seizures, pneumonia, GERD, diverticulosis, diabetes mellitus, chronic low back pain, bipolar disorder, anxiety, and depression. She is known to [facility] and has been followed by Dr. Estanislado Pandy since 01/2019. She first presented to our department at the request of Dr. Posey Pronto for management of intracranial stenosis, which was thought to be the cause of CVA 11/2018 and 12/2018. She underwent an image-guided diagnostic cerebral arteriogram 02/07/2019 by Dr. Estanislado Pandy which confirmed multiple areas of extracranial/intracranial stenosis. She was planned for endovascular revascularization of her left VA stenosis 02/20/2019 via right femoral approach, however procedure was aborted after femoral access due to development of small right common femoral artery pseudoaneurysm. This has resolved based on VAS [place] 02/21/2019.  Diagnostic cerebral arteriogram 02/07/2019: 1. 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 2. Approximately 50% stenosis of the left internal carotid artery petrous cavernous junction, and of 70% in the left ICA cavernous segment. 3. Approximately 50% stenosis of the left internal carotid artery at the bulb associated with ulceration. Angiographically occluded right posterior cerebral artery. Approximately 50% stenosis of the left vertebrobasilar junction proximal to the left posterior-inferior cerebellar artery. 4. Approximately 50% stenosis of the right internal carotid artery caval cavernous segment.  Patient presents today  for possible image-guided cerebral arteriogram with possible revascularization of left VA stenosis. Patient awake and alert sitting in bed. Accompanied by mother at bedside. Complains of low back pain, chronic and stable at this time. Complains of left foot pain- states she has what she thinks is a "bedsore". Mother states that she saw her PCP who started her on oral antibiotics Thursday 03/21/2019. Mother states that PCP states the area was infected. Denies fever, chills, chest pain, dyspnea, abdominal pain, or headache.  Currently taking Plavix 75 mg once daily and Aspirin 325 mg once daily.   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   due to elevated blood sugar 1700-off insulin 6 months   Depression    Diabetes mellitus    off insulin 6 months   Diverticulitis    Dyspnea    when walking   GERD (gastroesophageal reflux disease)    Headache(784.0)    Hypertension    Kidney dialysis status    on dialysis(stroke) off after 4-6 weeks   Pneumonia 2013, 06/2018   Respiratory failure ([place]) 11/13   with stroke - in hosp [facility] 12 weeks   Seizures ([place])    during elvated blood sugsr episode 11/13   Sepsis ([place])    Stroke ([place])    11/2018, 12/2018 Weak left side, speech- slurred, Short term memeroy, Gait unsteady    Vocal cord dysfunction     Past Surgical History:  Procedure Laterality Date   APPENDECTOMY     INSERTION OF DIALYSIS CATHETER  11/13   removed in 4-6 weeks   IR ANGIO INTRA EXTRACRAN SEL COM CAROTID INNOMINATE BILAT MOD SED  02/07/2019   IR ANGIO VERTEBRAL SEL VERTEBRAL BILAT  MOD SED  02/07/2019   IR ANGIOGRAM EXTREMITY LEFT  02/07/2019   IR ANGIOGRAM EXTREMITY RIGHT  02/20/2019   IR TRANSCATH EXCRAN VERT OR CAR A STENT  02/20/2019   IR US GUIDE VASC ACCESS RIGHT  02/07/2019   MULTIPLE EXTRACTIONS WITH  ALVEOLOPLASTY N/A 02/24/2014   Procedure: MULTIPLE EXTRACTION WITH ALVEOLOPLASTY AND BIOPSY;  Surgeon: Gae Bon, DDS;  Location: [place];  Service: Oral Surgery;  Laterality: N/A;   RADIOLOGY WITH ANESTHESIA N/A 02/20/2019   Procedure: RADIOLOGY WITH ANESTHESIA   STENTING;  Surgeon: Luanne Bras, MD;  Location: [place];  Service: Radiology;  Laterality: N/A;   TRACHEOSTOMY  11/13   closed 1/14   TRANSRECTAL DRAINAGE OF PELVIC ABSCESS  12/11    Allergies: Contrast media [iodinated diagnostic agents], Amitriptyline, Compazine, Oxycodone, Prochlorperazine, Promethazine, and Sumatriptan  Medications: Prior to Admission medications   Medication Sig Start Date End Date Taking? Authorizing Provider  aspirin EC 325 MG tablet Take 325 mg by mouth daily.    Yes [provider]  atorvastatin (LIPITOR) 40 MG tablet Take 40 mg by mouth daily.   Yes [provider]  clopidogrel (PLAVIX) 75 MG tablet Take 1 tablet (75 mg total) by mouth daily. 11/26/18  Yes Patel, Donika K, DO  dapagliflozin propanediol (FARXIGA) 10 MG TABS tablet Take 10 mg by mouth every morning.    Yes [provider]  diazepam (VALIUM) 5 MG tablet Take 5 mg by mouth 2 (two) times daily.    Yes [provider]  diphenhydrAMINE (BENADRYL) 25 MG tablet Take 50 mg by mouth See admin instructions. Take 50mg  by mouth one hour prior to procedure   Yes [provider]  doxycycline (MONODOX) 100 MG capsule Take 100 mg by mouth 2 (two) times daily.   Yes [provider]  DULoxetine (CYMBALTA) 30 MG capsule Take 30 mg by mouth daily.   Yes [provider]  gabapentin (NEURONTIN) 300 MG capsule Take 3 tablets in the morning, 3 tab in the afternoon, and 4 tablets at bedtime. Patient taking differently: Take 900-1,200 mg by mouth See admin instructions. Take 3 tablets in the morning, 3 tab in the afternoon, and 4 tablets at bedtime. 11/26/18  Yes Patel, Donika K, DO  halobetasol  (ULTRAVATE) 0.05 % cream Apply topically 2 (two) times daily.   Yes [provider]  HYDROcodone-acetaminophen (NORCO/VICODIN) 5-325 MG tablet Take 1 tablet by mouth every 4 (four) hours as needed for moderate pain.   Yes [provider]  labetalol (NORMODYNE) 300 MG tablet Take 300 mg by mouth 2 (two) times daily.   Yes [provider]  levETIRAcetam (KEPPRA XR) 500 MG 24 hr tablet Take 1,000 mg by mouth daily.   Yes [provider]  lisinopril (ZESTRIL) 40 MG tablet Take 40 mg by mouth daily.    Yes [provider]  metFORMIN (GLUCOPHAGE) 500 MG tablet Take 500 mg by mouth 2 (two) times daily. 11/16/18  Yes [provider]  omeprazole (PRILOSEC) 20 MG capsule Take 20 mg by mouth daily.   Yes [provider]  predniSONE (DELTASONE) 50 MG tablet Take 50 mg by mouth See admin instructions. Take one tablet by mouth 13 hours, 7 hours, and 1 hour before procedure 02/15/19  Yes [provider]  risperiDONE (RISPERDAL) 0.5 MG tablet Take 0.5 mg by mouth at bedtime.   Yes [provider]  traZODone (DESYREL) 100 MG tablet Take 200 mg by mouth at bedtime.  Yes [provider]  acetaminophen (TYLENOL) 325 MG tablet Take 650 mg by mouth every 6 (six) hours as needed. pain    [provider]  ibuprofen (ADVIL) 600 MG tablet Take 600 mg by mouth 2 (two) times daily.  11/16/18   [provider]     Family History  Problem Relation Age of Onset   Lung cancer Maternal Grandmother    Heart attack Maternal Grandfather    Diabetes Paternal Grandfather     Social History   Socioeconomic History   Marital status: Single    Spouse name: Not on file   Number of children: 0   Years of education: 14   Highest education level: Not on file  Occupational History   Not on file  Social Needs   Financial resource strain: Not on file   Food insecurity    Worry: Not on file    Inability: Not on file     Transportation needs    Medical: Not on file    Non-medical: Not on file  Tobacco Use   Smoking status: Former Smoker    Packs/day: 0.60    Years: 23.00    Pack years: 13.80    Types: Cigarettes    Quit date: 02/05/2019    Years since quitting: 0.1   Smokeless tobacco: Never Used  Substance and Sexual Activity   Alcohol use: No    Alcohol/week: 0.0 standard drinks   Drug use: No   Sexual activity: Not Currently    Birth control/protection: None  Lifestyle   Physical activity    Days per week: Not on file    Minutes per session: Not on file   Stress: Not on file  Relationships   Social connections    Talks on phone: Not on file    Gets together: Not on file    Attends religious service: Not on file    Active member of club or organization: Not on file    Attends meetings of clubs or organizations: Not on file    Relationship status: Not on file  Other Topics Concern   Not on file  Social History Narrative   She is single and does not have any children.    She has 2 yrs of college level education.   She has 5-6 caffeine drinks daily.    Left handed      Living with parents.     Review of Systems: A 12 point ROS discussed and pertinent positives are indicated in the HPI above.  All other systems are negative.  Review of Systems  Constitutional: Negative for chills and fever.  Respiratory: Negative for shortness of breath and wheezing.   Cardiovascular: Negative for chest pain and palpitations.  Gastrointestinal: Negative for abdominal pain.  Musculoskeletal: Positive for back pain.  Skin: Positive for wound.  Neurological: Negative for headaches.    Vital Signs: BP 124/71    Pulse 83    Temp 97.6 F (36.4 C) (Oral)    Resp 18    Ht 5\' 5"  (1.651 m)    Wt 215 lb (97.5 kg)    SpO2 97%    BMI 35.78 kg/m   Physical Exam Vitals signs and nursing note reviewed.  Constitutional:      General: She is not in acute distress.    Appearance: Normal  appearance.  Cardiovascular:     Rate and Rhythm: Normal rate and regular rhythm.     Heart sounds: Normal heart sounds. No  murmur.  Pulmonary:     Effort: Pulmonary effort is normal. No respiratory distress.     Breath sounds: Normal breath sounds. No wheezing.  Skin:    General: Skin is warm and dry.     Comments: Left foot with approximately 1-2 cm ulceration of lateral foot at 5th MTP joint and with approximately 0.5-1 cm ulceration of lateral malleolus- both tender to palpation; no erythema, drainage, or active bleeding noted.  Neurological:     Mental Status: She is alert and oriented to person, place, and time.      MD Evaluation Airway: WNL Heart: WNL Abdomen: WNL Chest/ Lungs: WNL ASA  Classification: 3 Mallampati/Airway Score: Two   Imaging: No results found.  Labs:  CBC: Recent Labs    11/14/18 1521 02/05/19 1051 02/20/19 0700 03/25/19 0646  WBC 8.3 13.3* 12.5* 11.4*  HGB 11.7* 12.1 11.8* 11.7*  HCT 37.2 38.5 39.0 36.9  PLT 183 212 223 222    COAGS: Recent Labs    02/05/19 1051 02/20/19 0700 03/25/19 0646  INR 1.1 1.0 1.1  APTT  --  24 25    BMP: Recent Labs    11/14/18 1521 02/05/19 1051 02/20/19 0700 03/25/19 0646  NA 135 138 135 136  K 4.0 4.1 4.6 4.4  CL 101 103 104 102  CO2 23 20* 18* 19*  GLUCOSE 235* 238* 319* 321*  BUN 14 19 21* 21*  CALCIUM 9.0 9.4 9.8 9.4  CREATININE 0.72 0.94 0.97 1.03*  GFRNONAA >60 >60 >60 >60  GFRAA >60 >60 >60 >60     Assessment and Plan:  Left VA stenosis. Plan for image-guided cerebral arteriogram with possible revascularization (angioplasty/stent placement) today with Dr. Estanislado Pandy. Patient is NPO. Afebrile. Ok to proceed with Plavix/Aspirin use per Dr. Estanislado Pandy. INR 1.1 today. P2Y12 5 PRU today. COVID negative 03/21/2019. Patient has been pre-medicated for iodinated diagnostic agent allergy per IR protocol.  Risks and benefits of cerebral arteriogram with intervention were discussed  with the patient including, but not limited to bleeding, infection, vascular injury, contrast induced renal failure, stroke, reperfusion hemorrhage, or even death. This interventional procedure involves the use of X-rays and because of the nature of the planned procedure, it is possible that we will have prolonged use of X-ray fluoroscopy. Potential radiation risks to you include (but are not limited to) the following: - A slightly elevated risk for cancer  several years later in life. This risk is typically less than 0.5% percent. This risk is low in comparison to the normal incidence of human cancer, which is 33% for women and 50% for men according to the [facility]. - Radiation induced injury can include skin redness, resembling a rash, tissue breakdown / ulcers and hair loss (which can be temporary or permanent).  The likelihood of either of these occurring depends on the difficulty of the procedure and whether you are sensitive to radiation due to previous procedures, disease, or genetic conditions.  IF your procedure requires a prolonged use of radiation, you will be notified and given written instructions for further action.  It is your responsibility to monitor the irradiated area for the 2 weeks following the procedure and to notify your physician if you are concerned that you have suffered a radiation induced injury.   All of the patient's questions were answered, patient is agreeable to proceed. Consent signed and in chart.   Thank you for this interesting consult.  I greatly enjoyed meeting CHRISTIANNA GUTT and look forward  to participating in their care.  A copy of this report was sent to the requesting provider on this date.  Electronically Signed: Earley Abide, PA-C 03/25/2019, 8:43 AM   I spent a total of 40 Minutes in face to face in clinical consultation, greater than 50% of which was counseling/coordinating care for left VA stenosis/revascularization.

## 2019-03-26 ENCOUNTER — Encounter (HOSPITAL_COMMUNITY): Payer: Self-pay | Admitting: Interventional Radiology

## 2019-03-26 LAB — GLUCOSE, CAPILLARY
Glucose-Capillary: 177 mg/dL — ABNORMAL HIGH (ref 70–99)
Glucose-Capillary: 233 mg/dL — ABNORMAL HIGH (ref 70–99)

## 2019-03-26 LAB — CBC WITH DIFFERENTIAL/PLATELET
Abs Immature Granulocytes: 0.09 10*3/uL — ABNORMAL HIGH (ref 0.00–0.07)
Basophils Absolute: 0 10*3/uL (ref 0.0–0.1)
Basophils Relative: 0 %
Eosinophils Absolute: 0 10*3/uL (ref 0.0–0.5)
Eosinophils Relative: 0 %
HCT: 30.1 % — ABNORMAL LOW (ref 36.0–46.0)
Hemoglobin: 9.2 g/dL — ABNORMAL LOW (ref 12.0–15.0)
Immature Granulocytes: 1 %
Lymphocytes Relative: 18 %
Lymphs Abs: 2.2 10*3/uL (ref 0.7–4.0)
MCH: 26.7 pg (ref 26.0–34.0)
MCHC: 30.6 g/dL (ref 30.0–36.0)
MCV: 87.2 fL (ref 80.0–100.0)
Monocytes Absolute: 0.6 10*3/uL (ref 0.1–1.0)
Monocytes Relative: 5 %
Neutro Abs: 9.6 10*3/uL — ABNORMAL HIGH (ref 1.7–7.7)
Neutrophils Relative %: 76 %
Platelets: 192 10*3/uL (ref 150–400)
RBC: 3.45 MIL/uL — ABNORMAL LOW (ref 3.87–5.11)
RDW: 14.7 % (ref 11.5–15.5)
WBC: 12.6 10*3/uL — ABNORMAL HIGH (ref 4.0–10.5)
nRBC: 0 % (ref 0.0–0.2)

## 2019-03-26 LAB — BASIC METABOLIC PANEL
Anion gap: 10 (ref 5–15)
BUN: 18 mg/dL (ref 6–20)
CO2: 21 mmol/L — ABNORMAL LOW (ref 22–32)
Calcium: 7.8 mg/dL — ABNORMAL LOW (ref 8.9–10.3)
Chloride: 108 mmol/L (ref 98–111)
Creatinine, Ser: 0.87 mg/dL (ref 0.44–1.00)
GFR calc Af Amer: >60 mL/min (ref >60)
GFR calc non Af Amer: >60 mL/min (ref >60)
Glucose, Bld: 251 mg/dL — ABNORMAL HIGH (ref 70–99)
Potassium: 3.5 mmol/L (ref 3.5–5.1)
Sodium: 139 mmol/L (ref 135–145)

## 2019-03-26 MED ORDER — ASPIRIN 81 MG PO CHEW: 81.0000 mg | CHEWABLE_TABLET | Freq: Every day | ORAL | 3 refills | Status: DC

## 2019-03-26 NOTE — Progress Notes (Signed)
Pt d/c to home with mom. All lines removed. Mom provided with discharge instructions, all questions answered.

## 2019-03-26 NOTE — Discharge Summary (Signed)
Patient ID: Taylor [name] MRN: [id number] DOB/AGE: [birth year] 38 y.o.  Admit date: 03/25/2019 Discharge date: 03/26/2019  Supervising Physician: Luanne Bras  Patient Status: [facility] - In-pt  Admission Diagnoses: Vertebral artery stenosis, left  Discharge Diagnoses:  Active Problems:   Vertebral artery stenosis, left   Discharged Condition: stable  Hospital Course:  Patient presented to [facility] 03/25/2019 for an image-guided cerebral arteriogram with revascularization of left VA stenosis using stent assisted angioplasty via right femoral approach by Dr. Estanislado Pandy. Procedure occurred without major complications and patient was transferred to neuro ICU in stable condition (VSS, right groin incision stable) for overnight observation. No major events occurred overnight.  Patient awake and alert sitting in bed ordering breakfast with no complaints at this time. Denies headache, weakness, numbness/tingling, dizziness, vision changes, hearing changes, tinnitus, or speech difficulty. Right groin incision stable. Plan to discharge home today and follow-up with Dr. Estanislado Pandy in clinic 2 weeks after discharge.   Consults: None  Significant Diagnostic Studies: No results found.  Treatments: Endovascular revascularization of left VA stenosis using stent assisted angioplasty  Discharge Exam: Blood pressure 124/84, pulse 81, temperature 98.4 F (36.9 C), temperature source Oral, resp. rate 13, height 5\' 5"  (1.651 m), weight 215 lb (97.5 kg), SpO2 99 %. Physical Exam Vitals signs and nursing note reviewed.  Constitutional:      General: She is not in acute distress.    Appearance: Normal appearance.  Cardiovascular:     Rate and Rhythm: Normal rate and regular rhythm.     Heart sounds: Normal heart sounds. No murmur.  Pulmonary:     Effort: Pulmonary effort is normal. No respiratory distress.     Breath sounds: Normal breath sounds. No wheezing.  Skin:    General: Skin is warm and  dry.     Comments: Right groin incision soft without active bleeding or hematoma.  Neurological:     Mental Status: She is alert.     Comments: Alert, awake, and oriented x3. Speech and comprehension intact. PERRL bilaterally. EOMs intact bilaterally without nystagmus or subjective diplopia. No facial asymmetry. Tongue midline. Motor power symmetric proportional to effort. No pronator drift. Fine motor and coordination intact and symmetric. Distal pulses palpable bilaterally with Doppler.     Disposition: Discharge disposition: 01-Home or Self Care       Discharge Instructions    Call MD for:  difficulty breathing, headache or visual disturbances   Complete by: As directed    Call MD for:  extreme fatigue   Complete by: As directed    Call MD for:  hives   Complete by: As directed    Call MD for:  persistant dizziness or light-headedness   Complete by: As directed    Call MD for:  persistant nausea and vomiting   Complete by: As directed    Call MD for:  redness, tenderness, or signs of infection (pain, swelling, redness, odor or green/yellow discharge around incision site)   Complete by: As directed    Call MD for:  severe uncontrolled pain   Complete by: As directed    Call MD for:  temperature >100.4   Complete by: As directed    Diet Carb Modified   Complete by: As directed    Discharge instructions   Complete by: As directed    Continue taking Plavix 75 mg once daily. Discontinue taking Aspirin 325 mg and begin taking Aspirin 81 mg once daily. No bending, stooping, or lifting more than 10 pounds for  2 weeks. No driving self for 2 weeks. Stay hydrated by drinking plenty of water.   Increase activity slowly   Complete by: As directed    Remove dressing in 24 hours   Complete by: As directed      Allergies as of 03/26/2019      Reactions   Contrast Media [iodinated Diagnostic Agents] Other (See Comments)   Went into kidney failure    Amitriptyline Other  (See Comments)   Unresponsive    Compazine Other (See Comments)   Unknown   Oxycodone Nausea And Vomiting   Severe patient will not take oxycodone or percocet.   Prochlorperazine Other (See Comments)   Alert but unable to move   Promethazine Swelling   Lips swell   Sumatriptan Rash      Medication List    STOP taking these medications   aspirin EC 325 MG tablet Replaced by: aspirin 81 MG chewable tablet     TAKE these medications   acetaminophen 325 MG tablet Commonly known as: TYLENOL Take 650 mg by mouth every 6 (six) hours as needed. pain   aspirin 81 MG chewable tablet Chew 1 tablet (81 mg total) by mouth daily. Start taking on: March 27, 2019 Replaces: aspirin EC 325 MG tablet   atorvastatin 40 MG tablet Commonly known as: LIPITOR Take 40 mg by mouth daily.   clopidogrel 75 MG tablet Commonly known as: PLAVIX Take 1 tablet (75 mg total) by mouth daily.   diazepam 5 MG tablet Commonly known as: VALIUM Take 5 mg by mouth 2 (two) times daily.   diphenhydrAMINE 25 MG tablet Commonly known as: BENADRYL Take 50 mg by mouth See admin instructions. Take 50mg  by mouth one hour prior to procedure   doxycycline 100 MG capsule Commonly known as: MONODOX Take 100 mg by mouth 2 (two) times daily.   DULoxetine 30 MG capsule Commonly known as: CYMBALTA Take 30 mg by mouth daily.   Farxiga 10 MG Tabs tablet Generic drug: dapagliflozin propanediol Take 10 mg by mouth every morning.   gabapentin 300 MG capsule Commonly known as: NEURONTIN Take 3 tablets in the morning, 3 tab in the afternoon, and 4 tablets at bedtime. What changed:   how much to take  how to take this  when to take this   halobetasol 0.05 % cream Commonly known as: ULTRAVATE Apply topically 2 (two) times daily.   HYDROcodone-acetaminophen 5-325 MG tablet Commonly known as: NORCO/VICODIN Take 1 tablet by mouth every 4 (four) hours as needed for moderate pain.   ibuprofen 600 MG tablet  Commonly known as: ADVIL Take 600 mg by mouth 2 (two) times daily.   labetalol 300 MG tablet Commonly known as: NORMODYNE Take 300 mg by mouth 2 (two) times daily.   levETIRAcetam 500 MG 24 hr tablet Commonly known as: KEPPRA XR Take 1,000 mg by mouth daily.   lisinopril 40 MG tablet Commonly known as: ZESTRIL Take 40 mg by mouth daily.   metFORMIN 500 MG tablet Commonly known as: GLUCOPHAGE Take 500 mg by mouth 2 (two) times daily.   omeprazole 20 MG capsule Commonly known as: PRILOSEC Take 20 mg by mouth daily.   predniSONE 50 MG tablet Commonly known as: DELTASONE Take 50 mg by mouth See admin instructions. Take one tablet by mouth 13 hours, 7 hours, and 1 hour before procedure   risperiDONE 0.5 MG tablet Commonly known as: RISPERDAL Take 0.5 mg by mouth at bedtime.   traZODone 100 MG tablet  Commonly known as: DESYREL Take 200 mg by mouth at bedtime.      Follow-up Information    Luanne Bras, MD Follow up in 2 week(s).   Specialties: Interventional Radiology, Radiology Why: Please follow-up with Dr. Estanislado Pandy in clinic 2 weeks after discharge. Our office will call you to set up this appointment. Contact information: [place] [address] [phone]            Electronically Signed: Earley Abide, PA-C 03/26/2019, 9:58 AM   I have spent Greater Than 30 Minutes discharging Lavona [place].

## 2019-03-26 NOTE — Anesthesia Postprocedure Evaluation (Signed)
Anesthesia Post Note  Patient: Taylor [name]  Procedure(s) Performed: RADIOLOGY WITH ANESTHESIA STENTING (N/A )     Patient location during evaluation: PACU Anesthesia Type: General Level of consciousness: awake and alert Pain management: pain level controlled Vital Signs Assessment: post-procedure vital signs reviewed and stable Respiratory status: spontaneous breathing, nonlabored ventilation, respiratory function stable and patient connected to nasal cannula oxygen Cardiovascular status: blood pressure returned to baseline and stable Postop Assessment: no apparent nausea or vomiting Anesthetic complications: no    Last Vitals:  Vitals:   03/26/19 0600 03/26/19 0700  BP: 120/70 125/82  Pulse: (!) 103 80  Resp: (!) 22 (!) 9  Temp:    SpO2: 97% 96%    Last Pain:  Vitals:   03/26/19 0600  TempSrc:   PainSc: 7                  Anselma Herbel S

## 2019-04-02 ENCOUNTER — Encounter (HOSPITAL_COMMUNITY): Payer: Self-pay | Admitting: Interventional Radiology

## 2019-04-03 ENCOUNTER — Other Ambulatory Visit: Payer: Self-pay

## 2019-04-03 ENCOUNTER — Ambulatory Visit: Payer: Medicaid Other | Admitting: Orthopaedic Surgery

## 2019-04-16 ENCOUNTER — Ambulatory Visit (HOSPITAL_COMMUNITY): Admission: RE | Admit: 2019-04-16 | Payer: Medicaid Other | Source: Ambulatory Visit

## 2019-04-17 ENCOUNTER — Telehealth (HOSPITAL_COMMUNITY): Payer: Self-pay

## 2019-04-17 NOTE — Telephone Encounter (Signed)
Daughter called to cancel because she was sick. She is waiting to hear back from PCP. She will call back to reschedule once they have been cleared if COVID +. AW

## 2019-05-04 ENCOUNTER — Other Ambulatory Visit: Payer: Self-pay | Admitting: Neurology

## 2019-05-14 ENCOUNTER — Other Ambulatory Visit: Payer: Self-pay | Admitting: *Deleted

## 2019-05-14 DIAGNOSIS — Z20822 Contact with and (suspected) exposure to covid-19: Secondary | ICD-10-CM

## 2019-05-15 ENCOUNTER — Telehealth (HOSPITAL_COMMUNITY): Payer: Self-pay

## 2019-05-15 NOTE — Telephone Encounter (Signed)
Called to reschedule f/u. Pt still not feeling well, will call to reschedule when better. AW

## 2019-05-16 LAB — NOVEL CORONAVIRUS, NAA: SARS-CoV-2, NAA: NOT DETECTED

## 2019-06-03 ENCOUNTER — Other Ambulatory Visit: Payer: Self-pay | Admitting: Neurology

## 2019-06-23 ENCOUNTER — Inpatient Hospital Stay (HOSPITAL_COMMUNITY)
Admission: AD | Admit: 2019-06-23 | Discharge: 2019-07-05 | DRG: 271 | Disposition: A | Discharge status: Home Health | Payer: Medicaid Other | Source: Other Acute Inpatient Hospital | Attending: Internal Medicine | Admitting: Internal Medicine

## 2019-06-23 DIAGNOSIS — E11628 Type 2 diabetes mellitus with other skin complications: Secondary | ICD-10-CM | POA: Diagnosis present

## 2019-06-23 DIAGNOSIS — F319 Bipolar disorder, unspecified: Secondary | ICD-10-CM | POA: Diagnosis present

## 2019-06-23 DIAGNOSIS — L089 Local infection of the skin and subcutaneous tissue, unspecified: Secondary | ICD-10-CM | POA: Diagnosis not present

## 2019-06-23 DIAGNOSIS — Z22330 Carrier of Group B streptococcus: Secondary | ICD-10-CM

## 2019-06-23 DIAGNOSIS — Y92239 Unspecified place in hospital as the place of occurrence of the external cause: Secondary | ICD-10-CM | POA: Diagnosis present

## 2019-06-23 DIAGNOSIS — I959 Hypotension, unspecified: Secondary | ICD-10-CM | POA: Diagnosis not present

## 2019-06-23 DIAGNOSIS — Z23 Encounter for immunization: Secondary | ICD-10-CM

## 2019-06-23 DIAGNOSIS — M869 Osteomyelitis, unspecified: Secondary | ICD-10-CM

## 2019-06-23 DIAGNOSIS — I739 Peripheral vascular disease, unspecified: Secondary | ICD-10-CM

## 2019-06-23 DIAGNOSIS — Z79899 Other long term (current) drug therapy: Secondary | ICD-10-CM

## 2019-06-23 DIAGNOSIS — M86472 Chronic osteomyelitis with draining sinus, left ankle and foot: Secondary | ICD-10-CM | POA: Diagnosis not present

## 2019-06-23 DIAGNOSIS — Z9862 Peripheral vascular angioplasty status: Secondary | ICD-10-CM | POA: Diagnosis not present

## 2019-06-23 DIAGNOSIS — E1165 Type 2 diabetes mellitus with hyperglycemia: Secondary | ICD-10-CM | POA: Diagnosis present

## 2019-06-23 DIAGNOSIS — I639 Cerebral infarction, unspecified: Secondary | ICD-10-CM | POA: Diagnosis present

## 2019-06-23 DIAGNOSIS — L03116 Cellulitis of left lower limb: Secondary | ICD-10-CM | POA: Diagnosis present

## 2019-06-23 DIAGNOSIS — E119 Type 2 diabetes mellitus without complications: Secondary | ICD-10-CM

## 2019-06-23 DIAGNOSIS — I69328 Other speech and language deficits following cerebral infarction: Secondary | ICD-10-CM | POA: Diagnosis not present

## 2019-06-23 DIAGNOSIS — I69354 Hemiplegia and hemiparesis following cerebral infarction affecting left non-dominant side: Secondary | ICD-10-CM | POA: Diagnosis not present

## 2019-06-23 DIAGNOSIS — F3170 Bipolar disorder, currently in remission, most recent episode unspecified: Secondary | ICD-10-CM | POA: Diagnosis not present

## 2019-06-23 DIAGNOSIS — K219 Gastro-esophageal reflux disease without esophagitis: Secondary | ICD-10-CM | POA: Diagnosis present

## 2019-06-23 DIAGNOSIS — E1151 Type 2 diabetes mellitus with diabetic peripheral angiopathy without gangrene: Secondary | ICD-10-CM | POA: Diagnosis not present

## 2019-06-23 DIAGNOSIS — M86172 Other acute osteomyelitis, left ankle and foot: Secondary | ICD-10-CM | POA: Diagnosis not present

## 2019-06-23 DIAGNOSIS — T8140XA Infection following a procedure, unspecified, initial encounter: Secondary | ICD-10-CM | POA: Diagnosis present

## 2019-06-23 DIAGNOSIS — Z885 Allergy status to narcotic agent status: Secondary | ICD-10-CM

## 2019-06-23 DIAGNOSIS — F32A Depression, unspecified: Secondary | ICD-10-CM | POA: Diagnosis present

## 2019-06-23 DIAGNOSIS — L00 Staphylococcal scalded skin syndrome: Secondary | ICD-10-CM | POA: Diagnosis present

## 2019-06-23 DIAGNOSIS — E1169 Type 2 diabetes mellitus with other specified complication: Secondary | ICD-10-CM | POA: Diagnosis present

## 2019-06-23 DIAGNOSIS — Z6836 Body mass index (BMI) 36.0-36.9, adult: Secondary | ICD-10-CM

## 2019-06-23 DIAGNOSIS — Z87891 Personal history of nicotine dependence: Secondary | ICD-10-CM | POA: Diagnosis not present

## 2019-06-23 DIAGNOSIS — E11621 Type 2 diabetes mellitus with foot ulcer: Secondary | ICD-10-CM | POA: Diagnosis present

## 2019-06-23 DIAGNOSIS — Z8249 Family history of ischemic heart disease and other diseases of the circulatory system: Secondary | ICD-10-CM

## 2019-06-23 DIAGNOSIS — F329 Major depressive disorder, single episode, unspecified: Secondary | ICD-10-CM | POA: Diagnosis not present

## 2019-06-23 DIAGNOSIS — I6350 Cerebral infarction due to unspecified occlusion or stenosis of unspecified cerebral artery: Secondary | ICD-10-CM | POA: Diagnosis not present

## 2019-06-23 DIAGNOSIS — I10 Essential (primary) hypertension: Secondary | ICD-10-CM | POA: Diagnosis present

## 2019-06-23 DIAGNOSIS — Z95828 Presence of other vascular implants and grafts: Secondary | ICD-10-CM | POA: Diagnosis not present

## 2019-06-23 DIAGNOSIS — Y848 Other medical procedures as the cause of abnormal reaction of the patient, or of later complication, without mention of misadventure at the time of the procedure: Secondary | ICD-10-CM | POA: Diagnosis present

## 2019-06-23 DIAGNOSIS — E1152 Type 2 diabetes mellitus with diabetic peripheral angiopathy with gangrene: Principal | ICD-10-CM | POA: Diagnosis present

## 2019-06-23 DIAGNOSIS — B951 Streptococcus, group B, as the cause of diseases classified elsewhere: Secondary | ICD-10-CM | POA: Diagnosis not present

## 2019-06-23 DIAGNOSIS — B9561 Methicillin susceptible Staphylococcus aureus infection as the cause of diseases classified elsewhere: Secondary | ICD-10-CM | POA: Diagnosis present

## 2019-06-23 DIAGNOSIS — E785 Hyperlipidemia, unspecified: Secondary | ICD-10-CM | POA: Diagnosis present

## 2019-06-23 DIAGNOSIS — Z992 Dependence on renal dialysis: Secondary | ICD-10-CM

## 2019-06-23 DIAGNOSIS — G40909 Epilepsy, unspecified, not intractable, without status epilepticus: Secondary | ICD-10-CM | POA: Diagnosis present

## 2019-06-23 DIAGNOSIS — E440 Moderate protein-calorie malnutrition: Secondary | ICD-10-CM | POA: Diagnosis present

## 2019-06-23 DIAGNOSIS — E669 Obesity, unspecified: Secondary | ICD-10-CM | POA: Diagnosis present

## 2019-06-23 DIAGNOSIS — R52 Pain, unspecified: Secondary | ICD-10-CM | POA: Diagnosis not present

## 2019-06-23 DIAGNOSIS — L97529 Non-pressure chronic ulcer of other part of left foot with unspecified severity: Secondary | ICD-10-CM | POA: Diagnosis present

## 2019-06-23 DIAGNOSIS — L02612 Cutaneous abscess of left foot: Secondary | ICD-10-CM | POA: Diagnosis not present

## 2019-06-23 DIAGNOSIS — Z888 Allergy status to other drugs, medicaments and biological substances status: Secondary | ICD-10-CM | POA: Diagnosis not present

## 2019-06-23 DIAGNOSIS — F419 Anxiety disorder, unspecified: Secondary | ICD-10-CM | POA: Diagnosis present

## 2019-06-23 DIAGNOSIS — L0390 Cellulitis, unspecified: Secondary | ICD-10-CM | POA: Diagnosis not present

## 2019-06-23 DIAGNOSIS — Z7984 Long term (current) use of oral hypoglycemic drugs: Secondary | ICD-10-CM

## 2019-06-23 DIAGNOSIS — Z833 Family history of diabetes mellitus: Secondary | ICD-10-CM

## 2019-06-23 DIAGNOSIS — M86272 Subacute osteomyelitis, left ankle and foot: Secondary | ICD-10-CM | POA: Diagnosis not present

## 2019-06-23 DIAGNOSIS — E1100 Type 2 diabetes mellitus with hyperosmolarity without nonketotic hyperglycemic-hyperosmolar coma (NKHHC): Secondary | ICD-10-CM | POA: Diagnosis not present

## 2019-06-23 DIAGNOSIS — Z91041 Radiographic dye allergy status: Secondary | ICD-10-CM | POA: Diagnosis not present

## 2019-06-23 DIAGNOSIS — E1140 Type 2 diabetes mellitus with diabetic neuropathy, unspecified: Secondary | ICD-10-CM | POA: Diagnosis present

## 2019-06-23 LAB — BASIC METABOLIC PANEL
Anion gap: 12 (ref 5–15)
BUN: 11 mg/dL (ref 6–20)
CO2: 27 mmol/L (ref 22–32)
Calcium: 8.5 mg/dL — ABNORMAL LOW (ref 8.9–10.3)
Chloride: 100 mmol/L (ref 98–111)
Creatinine, Ser: 0.75 mg/dL (ref 0.44–1.00)
GFR calc Af Amer: >60 mL/min (ref >60)
GFR calc non Af Amer: >60 mL/min (ref >60)
Glucose, Bld: 267 mg/dL — ABNORMAL HIGH (ref 70–99)
Potassium: 3.2 mmol/L — ABNORMAL LOW (ref 3.5–5.1)
Sodium: 139 mmol/L (ref 135–145)

## 2019-06-23 LAB — CBC WITH DIFFERENTIAL/PLATELET
Abs Immature Granulocytes: 0.11 10*3/uL — ABNORMAL HIGH (ref 0.00–0.07)
Basophils Absolute: 0 10*3/uL (ref 0.0–0.1)
Basophils Relative: 0 %
Eosinophils Absolute: 0.2 10*3/uL (ref 0.0–0.5)
Eosinophils Relative: 2 %
HCT: 30.3 % — ABNORMAL LOW (ref 36.0–46.0)
Hemoglobin: 8.7 g/dL — ABNORMAL LOW (ref 12.0–15.0)
Immature Granulocytes: 1 %
Lymphocytes Relative: 18 %
Lymphs Abs: 1.7 10*3/uL (ref 0.7–4.0)
MCH: 22.5 pg — ABNORMAL LOW (ref 26.0–34.0)
MCHC: 28.7 g/dL — ABNORMAL LOW (ref 30.0–36.0)
MCV: 78.3 fL — ABNORMAL LOW (ref 80.0–100.0)
Monocytes Absolute: 0.4 10*3/uL (ref 0.1–1.0)
Monocytes Relative: 5 %
Neutro Abs: 7.1 10*3/uL (ref 1.7–7.7)
Neutrophils Relative %: 74 %
Platelets: 388 10*3/uL (ref 150–400)
RBC: 3.87 MIL/uL (ref 3.87–5.11)
RDW: 15.9 % — ABNORMAL HIGH (ref 11.5–15.5)
WBC: 9.5 10*3/uL (ref 4.0–10.5)
nRBC: 0 % (ref 0.0–0.2)

## 2019-06-23 LAB — GLUCOSE, CAPILLARY
Glucose-Capillary: 249 mg/dL — ABNORMAL HIGH (ref 70–99)
Glucose-Capillary: 321 mg/dL — ABNORMAL HIGH (ref 70–99)

## 2019-06-23 LAB — HIV ANTIBODY (ROUTINE TESTING W REFLEX): HIV Screen 4th Generation wRfx: NONREACTIVE

## 2019-06-23 LAB — PREALBUMIN: Prealbumin: 17.3 mg/dL — ABNORMAL LOW (ref 18–38)

## 2019-06-23 LAB — C-REACTIVE PROTEIN: CRP: 7.9 mg/dL — ABNORMAL HIGH (ref <1.0)

## 2019-06-23 LAB — SEDIMENTATION RATE: Sed Rate: 96 mm/hr — ABNORMAL HIGH (ref 0–22)

## 2019-06-23 MED ORDER — POLYETHYLENE GLYCOL 3350 17 G PO PACK: 17.0000 g | PACK | Freq: Every day | ORAL | Status: DC | PRN

## 2019-06-23 MED ORDER — ONDANSETRON HCL 4 MG/2ML IJ SOLN
4.0000 mg | Freq: Four times a day (QID) | INTRAMUSCULAR | Status: DC | PRN
  Administered 2019-06-26: 4 mg via INTRAVENOUS
  Filled 2019-06-23: qty 2

## 2019-06-23 MED ORDER — CANAGLIFLOZIN 100 MG PO TABS
100.0000 mg | ORAL_TABLET | Freq: Every day | ORAL | Status: DC
  Administered 2019-06-24 – 2019-07-05 (×10): 100 mg via ORAL
  Filled 2019-06-23 (×12): qty 1

## 2019-06-23 MED ORDER — GABAPENTIN 300 MG PO CAPS
900.0000 mg | ORAL_CAPSULE | Freq: Three times a day (TID) | ORAL | Status: DC
  Administered 2019-06-23 – 2019-07-05 (×33): 900 mg via ORAL
  Filled 2019-06-23 (×33): qty 3

## 2019-06-23 MED ORDER — LEVETIRACETAM 500 MG PO TABS: 500.0000 mg | ORAL_TABLET | Freq: Two times a day (BID) | ORAL | Status: DC

## 2019-06-23 MED ORDER — METRONIDAZOLE IN NACL 5-0.79 MG/ML-% IV SOLN
500.0000 mg | Freq: Three times a day (TID) | INTRAVENOUS | Status: DC
  Administered 2019-06-23 – 2019-06-24 (×3): 500 mg via INTRAVENOUS
  Filled 2019-06-23 (×3): qty 100

## 2019-06-23 MED ORDER — ATORVASTATIN CALCIUM 40 MG PO TABS
40.0000 mg | ORAL_TABLET | Freq: Every day | ORAL | Status: DC
  Administered 2019-06-24 – 2019-07-04 (×10): 40 mg via ORAL
  Filled 2019-06-23 (×10): qty 1

## 2019-06-23 MED ORDER — PANTOPRAZOLE SODIUM 40 MG PO TBEC
40.0000 mg | DELAYED_RELEASE_TABLET | Freq: Every day | ORAL | Status: DC
  Administered 2019-06-24 – 2019-07-05 (×10): 40 mg via ORAL
  Filled 2019-06-23 (×10): qty 1

## 2019-06-23 MED ORDER — ATORVASTATIN CALCIUM 40 MG PO TABS: 40.0000 mg | ORAL_TABLET | Freq: Every day | ORAL | Status: DC

## 2019-06-23 MED ORDER — HYDROCODONE-ACETAMINOPHEN 5-325 MG PO TABS
1.0000 | ORAL_TABLET | Freq: Four times a day (QID) | ORAL | Status: DC | PRN
  Administered 2019-06-23 – 2019-06-27 (×13): 1 via ORAL
  Filled 2019-06-23 (×12): qty 1

## 2019-06-23 MED ORDER — ASPIRIN 325 MG PO TABS
325.0000 mg | ORAL_TABLET | Freq: Every day | ORAL | Status: DC
  Administered 2019-06-24: 325 mg via ORAL
  Filled 2019-06-23: qty 1

## 2019-06-23 MED ORDER — LISINOPRIL 20 MG PO TABS
20.0000 mg | ORAL_TABLET | Freq: Every day | ORAL | Status: DC
  Administered 2019-06-24 – 2019-07-02 (×8): 20 mg via ORAL
  Filled 2019-06-23 (×8): qty 1

## 2019-06-23 MED ORDER — INSULIN DETEMIR 100 UNIT/ML ~~LOC~~ SOLN
25.0000 [IU] | Freq: Every day | SUBCUTANEOUS | Status: DC
  Filled 2019-06-23 (×2): qty 0.25

## 2019-06-23 MED ORDER — ENOXAPARIN SODIUM 40 MG/0.4ML ~~LOC~~ SOLN
40.0000 mg | Freq: Every day | SUBCUTANEOUS | Status: DC
  Administered 2019-06-24 – 2019-07-05 (×10): 40 mg via SUBCUTANEOUS
  Filled 2019-06-23 (×10): qty 0.4

## 2019-06-23 MED ORDER — CLOPIDOGREL BISULFATE 75 MG PO TABS: 75.0000 mg | ORAL_TABLET | Freq: Every day | ORAL | Status: DC

## 2019-06-23 MED ORDER — DIAZEPAM 2 MG PO TABS: 2.0000 mg | ORAL_TABLET | Freq: Two times a day (BID) | ORAL | Status: DC | PRN

## 2019-06-23 MED ORDER — HYDROMORPHONE HCL 1 MG/ML IJ SOLN
0.5000 mg | INTRAMUSCULAR | Status: DC | PRN
  Administered 2019-06-23 – 2019-07-03 (×35): 0.5 mg via INTRAVENOUS
  Filled 2019-06-23 (×34): qty 1

## 2019-06-23 MED ORDER — ONDANSETRON HCL 4 MG PO TABS: 4.0000 mg | ORAL_TABLET | Freq: Four times a day (QID) | ORAL | Status: DC | PRN

## 2019-06-23 MED ORDER — DULOXETINE HCL 30 MG PO CPEP
30.0000 mg | ORAL_CAPSULE | Freq: Every day | ORAL | Status: DC
  Administered 2019-06-24 – 2019-07-05 (×10): 30 mg via ORAL
  Filled 2019-06-23 (×11): qty 1

## 2019-06-23 MED ORDER — TRAZODONE HCL 100 MG PO TABS
200.0000 mg | ORAL_TABLET | Freq: Every day | ORAL | Status: DC
  Administered 2019-06-23 – 2019-07-04 (×11): 200 mg via ORAL
  Filled 2019-06-23 (×11): qty 2

## 2019-06-23 MED ORDER — DIAZEPAM 5 MG PO TABS
5.0000 mg | ORAL_TABLET | Freq: Two times a day (BID) | ORAL | Status: DC
  Administered 2019-06-23 – 2019-07-05 (×22): 5 mg via ORAL
  Filled 2019-06-23 (×23): qty 1

## 2019-06-23 MED ORDER — RISPERIDONE 0.5 MG PO TBDP
0.5000 mg | ORAL_TABLET | Freq: Every day | ORAL | Status: DC
  Administered 2019-06-23 – 2019-07-04 (×12): 0.5 mg via ORAL
  Filled 2019-06-23 (×14): qty 1

## 2019-06-23 MED ORDER — INSULIN ASPART 100 UNIT/ML ~~LOC~~ SOLN
0.0000 [IU] | Freq: Three times a day (TID) | SUBCUTANEOUS | Status: DC
  Administered 2019-06-24: 5 [IU] via SUBCUTANEOUS
  Administered 2019-06-24: 3 [IU] via SUBCUTANEOUS
  Administered 2019-06-24: 8 [IU] via SUBCUTANEOUS
  Administered 2019-06-25: 11 [IU] via SUBCUTANEOUS
  Administered 2019-06-26: 5 [IU] via SUBCUTANEOUS
  Administered 2019-06-26 (×2): 8 [IU] via SUBCUTANEOUS
  Administered 2019-06-27: 3 [IU] via SUBCUTANEOUS
  Administered 2019-06-27: 2 [IU] via SUBCUTANEOUS
  Administered 2019-06-27 – 2019-06-28 (×4): 3 [IU] via SUBCUTANEOUS
  Administered 2019-06-29: 5 [IU] via SUBCUTANEOUS
  Administered 2019-06-29: 3 [IU] via SUBCUTANEOUS
  Administered 2019-06-29: 5 [IU] via SUBCUTANEOUS
  Administered 2019-06-30: 8 [IU] via SUBCUTANEOUS
  Administered 2019-06-30: 18:00:00 2 [IU] via SUBCUTANEOUS
  Administered 2019-06-30: 11 [IU] via SUBCUTANEOUS
  Administered 2019-07-01: 5 [IU] via SUBCUTANEOUS
  Administered 2019-07-01: 13:00:00 8 [IU] via SUBCUTANEOUS
  Administered 2019-07-01: 3 [IU] via SUBCUTANEOUS
  Administered 2019-07-02 (×2): 5 [IU] via SUBCUTANEOUS
  Administered 2019-07-02: 2 [IU] via SUBCUTANEOUS
  Administered 2019-07-03: 8 [IU] via SUBCUTANEOUS
  Administered 2019-07-03: 13:00:00 2 [IU] via SUBCUTANEOUS
  Administered 2019-07-04: 3 [IU] via SUBCUTANEOUS
  Administered 2019-07-04: 2 [IU] via SUBCUTANEOUS
  Administered 2019-07-04: 8 [IU] via SUBCUTANEOUS
  Administered 2019-07-05: 5 [IU] via SUBCUTANEOUS
  Administered 2019-07-05: 8 [IU] via SUBCUTANEOUS

## 2019-06-23 MED ORDER — SODIUM CHLORIDE 0.9 % IV SOLN
2.0000 g | Freq: Three times a day (TID) | INTRAVENOUS | Status: DC
  Administered 2019-06-23 – 2019-06-24 (×2): 2 g via INTRAVENOUS
  Filled 2019-06-23 (×4): qty 2

## 2019-06-23 MED ORDER — POTASSIUM CHLORIDE CRYS ER 20 MEQ PO TBCR
40.0000 meq | EXTENDED_RELEASE_TABLET | Freq: Once | ORAL | Status: DC
  Filled 2019-06-23: qty 2

## 2019-06-23 MED ORDER — LEVETIRACETAM ER 500 MG PO TB24
500.0000 mg | ORAL_TABLET | Freq: Two times a day (BID) | ORAL | Status: DC
  Administered 2019-06-23 – 2019-07-05 (×22): 500 mg via ORAL
  Filled 2019-06-23 (×25): qty 1

## 2019-06-23 MED ORDER — INSULIN ASPART 100 UNIT/ML ~~LOC~~ SOLN
0.0000 [IU] | Freq: Every day | SUBCUTANEOUS | Status: DC
  Administered 2019-06-25 – 2019-06-29 (×4): 2 [IU] via SUBCUTANEOUS

## 2019-06-23 MED ORDER — VANCOMYCIN HCL 1750 MG/350ML IV SOLN
1750.0000 mg | INTRAVENOUS | Status: DC
  Administered 2019-06-23: 1750 mg via INTRAVENOUS
  Filled 2019-06-23 (×2): qty 350

## 2019-06-23 MED ORDER — PANTOPRAZOLE SODIUM 40 MG PO TBEC: 40.0000 mg | DELAYED_RELEASE_TABLET | Freq: Every day | ORAL | Status: DC

## 2019-06-23 NOTE — H&P (View-Only) (Signed)
[facility] [place]  Patient name: Taylor [name] MRN: [id number] DOB: [birth year] Sex: female   REQUESTING PROVIDER:   Hospital service   REASON FOR CONSULT:    Left foot osteomyelitis with ulcer  HISTORY OF PRESENT ILLNESS:   Taylor [name] is a 39 y.o. female, who was transferred to [facility] from [facility] for vascular evaluation.  The patient was hospitalized at [facility] from 06/12/2019 to 06/23/2019 for a left foot infection with cellulitis and abscess complicated by left fifth toe osteomyelitis.  While at [facility], she underwent incision and drainage by general surgery on 06/15/2019.  ABIs were done that was notable for moderate arterial occlusion in the left leg.  MRI confirmed osteomyelitis in the fifth metatarsal head.  Wound cultures grew out Streptococcus and staph.  She is currently on IV antibiotics.  The patient has history of acute renal failure, but creatinine is normal on admission.  White blood cell count is within normal limits.  She is a diabetic which is poorly controlled.  She does have a history of stroke, status post vertebral artery stenting.  She is a former smoker.  The patient is now on a statin for hypercholesterolemia.  She is bipolar  PAST MEDICAL HISTORY    Past Medical History:  Diagnosis Date  . Anxiety   . ARF (acute renal failure) ([place]) 2013  . Bipolar disorder ([place])   . Cerebral artery occlusion with cerebral infarction ([place]) 11/13   due to elevated blood sugar 1700-off insulin 6 months  . Depression   . Diabetes mellitus    off insulin 6 months  . Diverticulitis   . Dyspnea    when walking  . GERD (gastroesophageal reflux disease)   . Headache(784.0)   . Hypertension   . Kidney dialysis status    on dialysis(stroke) off after 4-6 weeks  . Pneumonia 2013, 06/2018  . Respiratory failure ([place]) 11/13   with stroke - in hosp [facility] 12 weeks  . Seizures ([place])    during  elvated blood sugsr episode 11/13  . Sepsis ([place])   . Stroke ([place])    11/2018, 12/2018 Weak left side, speech- slurred, Short term memeroy, Gait unsteady   . Vocal cord dysfunction      FAMILY HISTORY   Family History  Problem Relation Age of Onset  . Lung cancer Maternal Grandmother   . Heart attack Maternal Grandfather   . Diabetes Paternal Grandfather     SOCIAL HISTORY:   Social History   Socioeconomic History  . Marital status: Single    Spouse name: Not on file  . Number of children: 0  . Years of education: 56  . Highest education level: Not on file  Occupational History  . Not on file  Tobacco Use  . Smoking status: Former Smoker    Packs/day: 0.60    Years: 23.00    Pack years: 13.80    Types: Cigarettes    Quit date: 02/05/2019    Years since quitting: 0.3  . Smokeless tobacco: Never Used  Substance and Sexual Activity  . Alcohol use: No    Alcohol/week: 0.0 standard drinks  . Drug use: No  . Sexual activity: Not Currently    Birth control/protection: None  Other Topics Concern  . Not on file  Social History Narrative   She is single and does not have any children.    She has 2 yrs of college level education.   She has  5-6 caffeine drinks daily.    Left handed      Living with parents.   Social Determinants of Health   Financial Resource Strain:   . Difficulty of Paying Living Expenses: Not on file  Food Insecurity:   . Worried About Charity fundraiser in the Last Year: Not on file  . Ran Out of Food in the Last Year: Not on file  Transportation Needs:   . Lack of Transportation (Medical): Not on file  . Lack of Transportation (Non-Medical): Not on file  Physical Activity:   . Days of Exercise per Week: Not on file  . Minutes of Exercise per Session: Not on file  Stress:   . Feeling of Stress : Not on file  Social Connections:   . Frequency of Communication with Friends and Family: Not on file  . Frequency of Social Gatherings with  Friends and Family: Not on file  . Attends Religious Services: Not on file  . Active Member of Clubs or Organizations: Not on file  . Attends Archivist Meetings: Not on file  . Marital Status: Not on file  Intimate Partner Violence:   . Fear of Current or Ex-Partner: Not on file  . Emotionally Abused: Not on file  . Physically Abused: Not on file  . Sexually Abused: Not on file    ALLERGIES:    Allergies  Allergen Reactions  . Contrast Media [Iodinated Diagnostic Agents] Other (See Comments)    Went into kidney failure   . Amitriptyline Other (See Comments)    Unresponsive   . Compazine Other (See Comments)    Alert but unable to move  . Morphine And Related Other (See Comments)    Chest pain  . Oxycodone Nausea And Vomiting    Severe n/v -  patient will not take oxycodone or percocet.  . Prochlorperazine Other (See Comments)    Alert but unable to move  . Promethazine Swelling    Lips swell  . Sumatriptan Rash    CURRENT MEDICATIONS:    Current Facility-Administered Medications  Medication Dose Route Frequency Provider Last Rate Last Admin  . [START ON 06/24/2019] atorvastatin (LIPITOR) tablet 40 mg  40 mg Oral q1800 Chinbuah, Egya N, MD      . ceFEPIme (MAXIPIME) 2 g in sodium chloride 0.9 % 100 mL IVPB  2 g Intravenous Q8H Carney, Jessica C, RPH      . diazepam (VALIUM) tablet 2 mg  2 mg Oral Q12H PRN Phineas Semen, MD      . Derrill Memo ON 06/24/2019] DULoxetine (CYMBALTA) DR capsule 30 mg  30 mg Oral Daily Chinbuah, Audley Hose, MD      . Derrill Memo ON 06/24/2019] enoxaparin (LOVENOX) injection 40 mg  40 mg Subcutaneous Daily Chinbuah, Audley Hose, MD      . HYDROcodone-acetaminophen (NORCO/VICODIN) 5-325 MG per tablet 1 tablet  1 tablet Oral Q6H PRN Phineas Semen, MD   1 tablet at 06/23/19 2008  . HYDROmorphone (DILAUDID) injection 0.5 mg  0.5 mg Intravenous Q4H PRN Phineas Semen, MD      . Derrill Memo ON 06/24/2019] insulin aspart (novoLOG) injection 0-15 Units  0-15  Units Subcutaneous TID WC Chinbuah, Egya N, MD      . insulin aspart (novoLOG) injection 0-5 Units  0-5 Units Subcutaneous QHS Chinbuah, Egya N, MD      . insulin detemir (LEVEMIR) injection 25 Units  25 Units Subcutaneous QHS Chinbuah, Audley Hose, MD      .  levETIRAcetam (KEPPRA) tablet 500 mg  500 mg Oral BID Chinbuah, Egya N, MD      . lisinopril (ZESTRIL) tablet 20 mg  20 mg Oral Daily Chinbuah, Egya N, MD      . metroNIDAZOLE (FLAGYL) IVPB 500 mg  500 mg Intravenous Q8H Chinbuah, Egya N, MD 100 mL/hr at 06/23/19 2023 500 mg at 06/23/19 2023  . ondansetron (ZOFRAN) tablet 4 mg  4 mg Oral Q6H PRN Chinbuah, Audley Hose, MD       Or  . ondansetron (ZOFRAN) injection 4 mg  4 mg Intravenous Q6H PRN Chinbuah, Audley Hose, MD      . pantoprazole (PROTONIX) EC tablet 40 mg  40 mg Oral Daily Chinbuah, Egya N, MD      . polyethylene glycol (MIRALAX / GLYCOLAX) packet 17 g  17 g Oral Daily PRN Chinbuah, Audley Hose, MD      . risperiDONE (RISPERDAL M-TABS) disintegrating tablet 0.5 mg  0.5 mg Oral QHS Chinbuah, Audley Hose, MD      . vancomycin (VANCOREADY) IVPB 1750 mg/350 mL  1,750 mg Intravenous Q24H Carney, Jessica C, RPH        REVIEW OF SYSTEMS:   [X]  denotes positive finding, [ ]  denotes negative finding Cardiac  Comments:  Chest pain or chest pressure:    Shortness of breath upon exertion:    Short of breath when lying flat:    Irregular heart rhythm:        Vascular    Pain in calf, thigh, or hip brought on by ambulation:    Pain in feet at night that wakes you up from your sleep:  x   Blood clot in your veins:    Leg swelling:         Pulmonary    Oxygen at home:    Productive cough:     Wheezing:         Neurologic    Sudden weakness in arms or legs:     Sudden numbness in arms or legs:     Sudden onset of difficulty speaking or slurred speech:    Temporary loss of vision in one eye:     Problems with dizziness:         Gastrointestinal    Blood in stool:      Vomited blood:           Genitourinary    Burning when urinating:     Blood in urine:        Psychiatric    Major depression:         Hematologic    Bleeding problems:    Problems with blood clotting too easily:        Skin    Rashes or ulcers: x       Constitutional    Fever or chills:     PHYSICAL EXAM:   Vitals:   06/23/19 1811 06/23/19 1900 06/23/19 2019  BP: 132/83  98/68  Pulse: 95  100  Resp:   18  Temp: (!) 97.4 F (36.3 C)  97.7 F (36.5 C)  TempSrc: Oral    SpO2: 100%  100%  Weight:  93.1 kg   Height:  5\' 4"  (1.626 m)     GENERAL: The patient is a well-nourished female, in no acute distress. The vital signs are documented above. CARDIAC: There is a regular rate and rhythm.  VASCULAR: Nonpalpable pedal pulses PULMONARY: Nonlabored respirations ABDOMEN: Soft and non-tender with normal pitched bowel  sounds.  MUSCULOSKELETAL: There are no major deformities or cyanosis. NEUROLOGIC: No focal weakness or paresthesias are detected. SKIN: See photos below PSYCHIATRIC: The patient has a normal affect.        STUDIES:   Studies are unavailable for review.  Per report, abnormal ABIs.  ASSESSMENT and PLAN   Diabetic left foot infection in the setting of arterial insufficiency: Referring institution performed ABIs which by report are abnormal.  I discussed with the patient that this is a limb threatening situation.  I need to get a better evaluation of her circulation.  We discussed the best way to do that would be to proceed with angiography.  This will be scheduled for tomorrow.  I discussed the details of the procedure as well as risks and benefits.  All questions were answered.  Based off the findings on angiography, further plans for her foot will be made.  This includes toe amputation versus transmetatarsal amputation versus below-knee amputation.  She will be n.p.o. after midnight for her procedure tomorrow   Leia Alf, MD, FACS [facility]  [facility] [phone] Pager [phone]

## 2019-06-23 NOTE — H&P (Addendum)
History and Physical    Taylor [name] [id number] DOB: [birth year] DOA: 06/23/2019  PCP: Monico Blitz, MD   Patient coming from: Transfer from another facility [facility])  Chief Complaint: Patient was transferred for management of left foot osteomyelitis, necrotic ulcer associated with peripheral arterial disease  HPI: Taylor [name] is a 39 y.o. female with medical history significant for type 2 diabetes, previous stroke, seizures, hypertension, and hyperlipidemia.   Patient was hospitalized at [facility] facility from 06/12/2019 to 06/22/2018 for left foot infection with cellulitis and abscess complicated by left fifth toe osteomyelitis, necrotic ulcer.  During her clinical course, she was seen by general surgery and incision and drainage was performed on 06/15/2019.  As part of her work-up, patient had [name] of the extremities that was notable for moderate arterial occlusion more in the left lower extremity.  Left lower extremity MRI did confirm osteomyelitis of the fifth toe.  Her wound cultures yielded Streptococcus agalactia and Staphylococcus aureus and patient was covered with Zosyn and clindamycin.    Based on her peripheral arterial disease and her worsening foot infection, patient was transferred here for further evaluation by vascular surgery.  At bedside, patient complained of 8/10 left foot pain which was sharp, continuous, nonradiating, worsened by movement and had relief from analgesics she received at the hospital.  She denies fever, cough, chest pain, nausea, vomiting, diarrhea, polyuria, polydipsia headache or loss of consciousness.  Unfortunately, due to her cognitive impairment, patient is uncertain how she developed the ulcer and the duration.  Review of Systems: As per HPI otherwise 10 point review of systems negative.   Past Medical History:  Diagnosis Date  . Anxiety   . ARF (acute renal failure) ([place]) 2013  . Bipolar disorder ([place])   . Cerebral artery occlusion  with cerebral infarction ([place]) 11/13   due to elevated blood sugar 1700-off insulin 6 months  . Depression   . Diabetes mellitus    off insulin 6 months  . Diverticulitis   . Dyspnea    when walking  . GERD (gastroesophageal reflux disease)   . Headache(784.0)   . Hypertension   . Kidney dialysis status    on dialysis(stroke) off after 4-6 weeks  . Pneumonia 2013, 06/2018  . Respiratory failure ([place]) 11/13   with stroke - in hosp [facility] 12 weeks  . Seizures ([place])    during elvated blood sugsr episode 11/13  . Sepsis ([place])   . Stroke ([place])    11/2018, 12/2018 Weak left side, speech- slurred, Short term memeroy, Gait unsteady   . Vocal cord dysfunction     Past Surgical History:  Procedure Laterality Date  . APPENDECTOMY    . INSERTION OF DIALYSIS CATHETER  11/13   removed in 4-6 weeks  . IR ANGIO INTRA EXTRACRAN SEL COM CAROTID INNOMINATE BILAT MOD SED  02/07/2019  . IR ANGIO VERTEBRAL SEL VERTEBRAL BILAT MOD SED  02/07/2019  . IR ANGIOGRAM EXTREMITY LEFT  02/07/2019  . IR ANGIOGRAM EXTREMITY RIGHT  02/20/2019  . IR TRANSCATH EXCRAN VERT OR CAR A STENT  02/20/2019  . IR TRANSCATH EXCRAN VERT OR CAR A STENT  03/25/2019  . IR US GUIDE VASC ACCESS RIGHT  02/07/2019  . MULTIPLE EXTRACTIONS WITH ALVEOLOPLASTY N/A 02/24/2014   Procedure: MULTIPLE EXTRACTION WITH ALVEOLOPLASTY AND BIOPSY;  Surgeon: Gae Bon, DDS;  Location: [place];  Service: Oral Surgery;  Laterality: N/A;  . RADIOLOGY WITH ANESTHESIA N/A 02/20/2019   Procedure: RADIOLOGY WITH ANESTHESIA  STENTING;  Surgeon: Luanne Bras, MD;  Location: [place];  Service: Radiology;  Laterality: N/A;  . RADIOLOGY WITH ANESTHESIA N/A 03/25/2019   Procedure: RADIOLOGY WITH ANESTHESIA STENTING;  Surgeon: Luanne Bras, MD;  Location: [place];  Service: Radiology;  Laterality: N/A;  . TRACHEOSTOMY  11/13   closed 1/14  . TRANSRECTAL DRAINAGE OF PELVIC ABSCESS  12/11     reports that she quit smoking about 4 months ago. Her smoking use  included cigarettes. She has a 13.80 pack-year smoking history. She has never used smokeless tobacco. She reports that she does not drink alcohol or use drugs.  Allergies  Allergen Reactions  . Contrast Media [Iodinated Diagnostic Agents] Other (See Comments)    Went into kidney failure   . Acetaminophen   . Amitriptyline Other (See Comments)    Unresponsive   . Compazine Other (See Comments)    Unknown  . Oxycodone Nausea And Vomiting    Severe patient will not take oxycodone or percocet.  . Prochlorperazine Other (See Comments)    Alert but unable to move  . Promethazine Swelling    Lips swell  . Sumatriptan Rash    Family History  Problem Relation Age of Onset  . Lung cancer Maternal Grandmother   . Heart attack Maternal Grandfather   . Diabetes Paternal Grandfather    Positive for diabetes  Prior to Admission medications   Medication Sig Start Date End Date Taking? Authorizing Provider  acetaminophen (TYLENOL) 325 MG tablet Take 650 mg by mouth every 6 (six) hours as needed. pain    [provider]  aspirin 81 MG chewable tablet Chew 1 tablet (81 mg total) by mouth daily. 03/27/19   Louk, [place], PA-C  atorvastatin (LIPITOR) 40 MG tablet Take 40 mg by mouth daily.    [provider]  clopidogrel (PLAVIX) 75 MG tablet TAKE 1 TABLET BY MOUTH DAILY 06/03/19   Narda Amber K, DO  dapagliflozin propanediol (FARXIGA) 10 MG TABS tablet Take 10 mg by mouth every morning.     [provider]  diazepam (VALIUM) 5 MG tablet Take 5 mg by mouth 2 (two) times daily.     [provider]  diphenhydrAMINE (BENADRYL) 25 MG tablet Take 50 mg by mouth See admin instructions. Take 50mg  by mouth one hour prior to procedure    [provider]  doxycycline (MONODOX) 100 MG capsule Take 100 mg by mouth 2 (two) times daily.    [provider]  DULoxetine (CYMBALTA) 30 MG capsule Take 30 mg by mouth daily.    [provider]    gabapentin (NEURONTIN) 300 MG capsule TAKE THREE CAPSULES BY MOUTH EVERY MORNING and TAKE THREE CAPSULES BY MOUTH AT NOON and TAKE FOUR CAPSULES BY MOUTH AT BEDTIME 05/06/19   Patel, Donika K, DO  halobetasol (ULTRAVATE) 0.05 % cream Apply topically 2 (two) times daily.    [provider]  HYDROcodone-acetaminophen (NORCO/VICODIN) 5-325 MG tablet Take 1 tablet by mouth every 4 (four) hours as needed for moderate pain.    [provider]  ibuprofen (ADVIL) 600 MG tablet Take 600 mg by mouth 2 (two) times daily.  11/16/18   [provider]  labetalol (NORMODYNE) 300 MG tablet Take 300 mg by mouth 2 (two) times daily.    [provider]  levETIRAcetam (KEPPRA XR) 500 MG 24 hr tablet Take 1,000 mg by mouth daily.    [provider]  lisinopril (ZESTRIL) 40 MG tablet Take 40 mg  by mouth daily.     [provider]  metFORMIN (GLUCOPHAGE) 500 MG tablet Take 500 mg by mouth 2 (two) times daily. 11/16/18   [provider]  omeprazole (PRILOSEC) 20 MG capsule Take 20 mg by mouth daily.    [provider]  predniSONE (DELTASONE) 50 MG tablet Take 50 mg by mouth See admin instructions. Take one tablet by mouth 13 hours, 7 hours, and 1 hour before procedure 02/15/19   [provider]  risperiDONE (RISPERDAL) 0.5 MG tablet Take 0.5 mg by mouth at bedtime.    [provider]  traZODone (DESYREL) 100 MG tablet Take 200 mg by mouth at bedtime.     [provider]    Physical Exam: Vitals:   06/23/19 1811 06/23/19 1900  BP: 132/83   Pulse: 95   Temp: (!) 97.4 F (36.3 C)   TempSrc: Oral   SpO2: 100%   Weight:  93.1 kg  Height:  5\' 4"  (1.626 m)    Constitutional: NAD, calm, comfortable Vitals:   06/23/19 1811 06/23/19 1900  BP: 132/83   Pulse: 95   Temp: (!) 97.4 F (36.3 C)   TempSrc: Oral   SpO2: 100%   Weight:  93.1 kg  Height:  5\' 4"  (1.626 m)   Eyes: PERRL, lids and conjunctivae normal ENMT:  Mucous membranes are moist. Posterior pharynx clear of any exudate or lesions.Normal dentition.  Neck: normal, supple, no masses, no thyromegaly Respiratory: clear to auscultation bilaterally, no wheezing, no crackles. Normal respiratory effort. No accessory muscle use.  Cardiovascular: Regular rate and rhythm, no murmurs / rubs / gallops. No extremity edema. 2+ pedal pulses. No carotid bruits.  Abdomen: no tenderness, no masses palpated. No hepatosplenomegaly. Bowel sounds positive.  Musculoskeletal: no clubbing / cyanosis. No joint deformity upper and lower extremities. Good ROM, no contractures. Normal muscle tone.  Skin: There is a left foot ulceration with necrotic tissue. Neurologic: CN 2-12 grossly intact. Sensation intact, DTR normal. Strength 5/5 in all 4.  Psychiatric: Normal judgment and insight. Alert and oriented x 3. Normal mood.   Labs on Admission: I have personally reviewed following labs and imaging studies  CBC: Recent Labs  Lab 06/23/19 1911  WBC 9.5  NEUTROABS 7.1  HGB 8.7*  HCT 30.3*  MCV 78.3*  PLT 123456   Basic Metabolic Panel: No results for input(s): NA, K, CL, CO2, GLUCOSE, BUN, CREATININE, CALCIUM, MG, PHOS in the last 168 hours. GFR: CrCl cannot be calculated (Patient's most recent lab result is older than the maximum 21 days allowed.). Liver Function Tests: No results for input(s): AST, ALT, ALKPHOS, BILITOT, PROT, ALBUMIN in the last 168 hours. No results for input(s): LIPASE, AMYLASE in the last 168 hours. No results for input(s): AMMONIA in the last 168 hours. Coagulation Profile: No results for input(s): INR, PROTIME in the last 168 hours. Cardiac Enzymes: No results for input(s): CKTOTAL, CKMB, CKMBINDEX, TROPONINI in the last 168 hours. BNP (last 3 results) No results for input(s): PROBNP in the last 8760 hours. HbA1C: No results for input(s): HGBA1C in the last 72 hours. CBG: No results for input(s): GLUCAP in the last 168 hours. Lipid  Profile: No results for input(s): CHOL, HDL, LDLCALC, TRIG, CHOLHDL, LDLDIRECT in the last 72 hours. Thyroid Function Tests: No results for input(s): TSH, T4TOTAL, FREET4, T3FREE, THYROIDAB in the last 72 hours. Anemia Panel: No results for input(s): VITAMINB12, FOLATE, FERRITIN, TIBC, IRON, RETICCTPCT in the last 72 hours. Urine analysis:  Component Value Date/Time   COLORURINE YELLOW 03/25/2019 0813   APPEARANCEUR CLEAR 03/25/2019 0813   LABSPEC 1.032 (H) 03/25/2019 0813   PHURINE 5.0 03/25/2019 0813   GLUCOSEU >=500 (A) 03/25/2019 0813   HGBUR LARGE (A) 03/25/2019 0813   BILIRUBINUR NEGATIVE 03/25/2019 0813   KETONESUR NEGATIVE 03/25/2019 0813   PROTEINUR NEGATIVE 03/25/2019 0813   UROBILINOGEN 0.2 03/02/2012 1829   NITRITE NEGATIVE 03/25/2019 0813   LEUKOCYTESUR NEGATIVE 03/25/2019 0813    Radiological Exams on Admission: No results found.  EKG: Independently reviewed.   Assessment/Plan Active Problems:   Diabetic foot infection ([place])   PAD (peripheral artery disease) (HCC)   Osteomyelitis of left foot ([place])   1.  Left foot infection and osteomyelitis of left fifth toe and necrosis: Patient is status post incision and drainage on 06/15/2019.  Her wound cultures reportedly yielded Streptococcus agalactia and Staphylococcus aureus.  Continue cefepime and vancomycin for now.  Vascular surgery have been consulted and their further recommendations are highly appreciated.  Based on clinical course, we will consider infectious disease consult waiting list history  2.  Peripheral arterial disease: Patient had [name] done at the outside facility consistent with peripheral arterial disease worse on the left lower extremity.  Case discussed with Dr. Trula Slade who will see patient on consult.  3.  Type 2 diabetes: Check hemoglobin A1c.  Monitor blood glucose.  Patient will be continued on Levemir 25 units at bedtime and insulin sliding scale coverage.  4.  Hyperlipidemia: Continue  statin  5.  Previous strokes: Secondary stroke prevention with aspirin and statin.  6. Seizure disorder: Continue Keppra  7.  Bipolar disorder depression: Continue Cymbalta  8.  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  9.  Hypertension: I have decreased dose of labetalol lisinopril to 20 mg daily and labetalol on hold due to low blood pressure readings.  This can be adjusted based on blood pressure profile  9.  Please complete medication reconciliation  DVT prophylaxis: Lovenox  Code Status: Full code  Family Communication: Unable to reach her mother at the time of documentation.  We will try in the morning  Disposition Plan: Patient may be discharged in 3 to 4 days to home versus short-term rehab Consults called: Vascular surgery with Dr Trula Slade Admission status: Inpatient medical floor  Phineas Semen MD [facility] Pager [phone]  If 7PM-7AM, please contact night-coverage [email or web address] Password [email or web address]  06/23/2019, 7:55 PM

## 2019-06-23 NOTE — Consult Note (Addendum)
Vascular and Vein Specialist of [place]  Patient name: Taylor [name] MRN: [id number] DOB: 04/02/1981 Sex: female   REQUESTING PROVIDER:   Hospital service   REASON FOR CONSULT:    Left foot osteomyelitis with ulcer  HISTORY OF PRESENT ILLNESS:   Taylor [name] is a 39 y.o. female, who was transferred to [facility] from [facility] for vascular evaluation.  The patient was hospitalized at [facility] from 06/12/2019 to 06/23/2019 for a left foot infection with cellulitis and abscess complicated by left fifth toe osteomyelitis.  While at [facility], she underwent incision and drainage by general surgery on 06/15/2019.  ABIs were done that was notable for moderate arterial occlusion in the left leg.  MRI confirmed osteomyelitis in the fifth metatarsal head.  Wound cultures grew out Streptococcus and staph.  She is currently on IV antibiotics.  The patient has history of acute renal failure, but creatinine is normal on admission.  White blood cell count is within normal limits.  She is a diabetic which is poorly controlled.  She does have a history of stroke, status post vertebral artery stenting.  She is a former smoker.  The patient is now on a statin for hypercholesterolemia.  She is bipolar  PAST MEDICAL HISTORY    Past Medical History:  Diagnosis Date  . Anxiety   . ARF (acute renal failure) ([place]) 2013  . Bipolar disorder ([place])   . Cerebral artery occlusion with cerebral infarction ([place]) 11/13   due to elevated blood sugar 1700-off insulin 6 months  . Depression   . Diabetes mellitus    off insulin 6 months  . Diverticulitis   . Dyspnea    when walking  . GERD (gastroesophageal reflux disease)   . Headache(784.0)   . Hypertension   . Kidney dialysis status    on dialysis(stroke) off after 4-6 weeks  . Pneumonia 2013, 06/2018  . Respiratory failure ([place]) 11/13   with stroke - in hosp ncbh 12 weeks  . Seizures ([place])    during  elvated blood sugsr episode 11/13  . Sepsis ([place])   . Stroke ([place])    11/2018, 12/2018 Weak left side, speech- slurred, Short term memeroy, Gait unsteady   . Vocal cord dysfunction      FAMILY HISTORY   Family History  Problem Relation Age of Onset  . Lung cancer Maternal Grandmother   . Heart attack Maternal Grandfather   . Diabetes Paternal Grandfather     SOCIAL HISTORY:   Social History   Socioeconomic History  . Marital status: Single    Spouse name: Not on file  . Number of children: 0  . Years of education: 1  . Highest education level: Not on file  Occupational History  . Not on file  Tobacco Use  . Smoking status: Former Smoker    Packs/day: 0.60    Years: 23.00    Pack years: 13.80    Types: Cigarettes    Quit date: 02/05/2019    Years since quitting: 0.3  . Smokeless tobacco: Never Used  Substance and Sexual Activity  . Alcohol use: No    Alcohol/week: 0.0 standard drinks  . Drug use: No  . Sexual activity: Not Currently    Birth control/protection: None  Other Topics Concern  . Not on file  Social History Narrative   She is single and does not have any children.    She has 2 yrs of college level education.   She has  5-6 caffeine drinks daily.    Left handed      Living with parents.   Social Determinants of Health   Financial Resource Strain:   . Difficulty of Paying Living Expenses: Not on file  Food Insecurity:   . Worried About Charity fundraiser in the Last Year: Not on file  . Ran Out of Food in the Last Year: Not on file  Transportation Needs:   . Lack of Transportation (Medical): Not on file  . Lack of Transportation (Non-Medical): Not on file  Physical Activity:   . Days of Exercise per Week: Not on file  . Minutes of Exercise per Session: Not on file  Stress:   . Feeling of Stress : Not on file  Social Connections:   . Frequency of Communication with Friends and Family: Not on file  . Frequency of Social Gatherings with  Friends and Family: Not on file  . Attends Religious Services: Not on file  . Active Member of Clubs or Organizations: Not on file  . Attends Archivist Meetings: Not on file  . Marital Status: Not on file  Intimate Partner Violence:   . Fear of Current or Ex-Partner: Not on file  . Emotionally Abused: Not on file  . Physically Abused: Not on file  . Sexually Abused: Not on file    ALLERGIES:    Allergies  Allergen Reactions  . Contrast Media [Iodinated Diagnostic Agents] Other (See Comments)    Went into kidney failure   . Amitriptyline Other (See Comments)    Unresponsive   . Compazine Other (See Comments)    Alert but unable to move  . Morphine And Related Other (See Comments)    Chest pain  . Oxycodone Nausea And Vomiting    Severe n/v -  patient will not take oxycodone or percocet.  . Prochlorperazine Other (See Comments)    Alert but unable to move  . Promethazine Swelling    Lips swell  . Sumatriptan Rash    CURRENT MEDICATIONS:    Current Facility-Administered Medications  Medication Dose Route Frequency Provider Last Rate Last Admin  . [START ON 06/24/2019] atorvastatin (LIPITOR) tablet 40 mg  40 mg Oral q1800 Chinbuah, Egya N, MD      . ceFEPIme (MAXIPIME) 2 g in sodium chloride 0.9 % 100 mL IVPB  2 g Intravenous Q8H Carney, Jessica C, RPH      . diazepam (VALIUM) tablet 2 mg  2 mg Oral Q12H PRN Phineas Semen, MD      . Derrill Memo ON 06/24/2019] DULoxetine (CYMBALTA) DR capsule 30 mg  30 mg Oral Daily Chinbuah, Audley Hose, MD      . Derrill Memo ON 06/24/2019] enoxaparin (LOVENOX) injection 40 mg  40 mg Subcutaneous Daily Chinbuah, Audley Hose, MD      . HYDROcodone-acetaminophen (NORCO/VICODIN) 5-325 MG per tablet 1 tablet  1 tablet Oral Q6H PRN Phineas Semen, MD   1 tablet at 06/23/19 2008  . HYDROmorphone (DILAUDID) injection 0.5 mg  0.5 mg Intravenous Q4H PRN Phineas Semen, MD      . Derrill Memo ON 06/24/2019] insulin aspart (novoLOG) injection 0-15 Units  0-15  Units Subcutaneous TID WC Chinbuah, Egya N, MD      . insulin aspart (novoLOG) injection 0-5 Units  0-5 Units Subcutaneous QHS Chinbuah, Egya N, MD      . insulin detemir (LEVEMIR) injection 25 Units  25 Units Subcutaneous QHS Chinbuah, Audley Hose, MD      .  levETIRAcetam (KEPPRA) tablet 500 mg  500 mg Oral BID Chinbuah, Egya N, MD      . lisinopril (ZESTRIL) tablet 20 mg  20 mg Oral Daily Chinbuah, Egya N, MD      . metroNIDAZOLE (FLAGYL) IVPB 500 mg  500 mg Intravenous Q8H Chinbuah, Egya N, MD 100 mL/hr at 06/23/19 2023 500 mg at 06/23/19 2023  . ondansetron (ZOFRAN) tablet 4 mg  4 mg Oral Q6H PRN Chinbuah, Audley Hose, MD       Or  . ondansetron (ZOFRAN) injection 4 mg  4 mg Intravenous Q6H PRN Chinbuah, Audley Hose, MD      . pantoprazole (PROTONIX) EC tablet 40 mg  40 mg Oral Daily Chinbuah, Egya N, MD      . polyethylene glycol (MIRALAX / GLYCOLAX) packet 17 g  17 g Oral Daily PRN Chinbuah, Audley Hose, MD      . risperiDONE (RISPERDAL M-TABS) disintegrating tablet 0.5 mg  0.5 mg Oral QHS Chinbuah, Audley Hose, MD      . vancomycin (VANCOREADY) IVPB 1750 mg/350 mL  1,750 mg Intravenous Q24H Carney, Jessica C, RPH        REVIEW OF SYSTEMS:   [X]  denotes positive finding, [ ]  denotes negative finding Cardiac  Comments:  Chest pain or chest pressure:    Shortness of breath upon exertion:    Short of breath when lying flat:    Irregular heart rhythm:        Vascular    Pain in calf, thigh, or hip brought on by ambulation:    Pain in feet at night that wakes you up from your sleep:  x   Blood clot in your veins:    Leg swelling:         Pulmonary    Oxygen at home:    Productive cough:     Wheezing:         Neurologic    Sudden weakness in arms or legs:     Sudden numbness in arms or legs:     Sudden onset of difficulty speaking or slurred speech:    Temporary loss of vision in one eye:     Problems with dizziness:         Gastrointestinal    Blood in stool:      Vomited blood:           Genitourinary    Burning when urinating:     Blood in urine:        Psychiatric    Major depression:         Hematologic    Bleeding problems:    Problems with blood clotting too easily:        Skin    Rashes or ulcers: x       Constitutional    Fever or chills:     PHYSICAL EXAM:   Vitals:   06/23/19 1811 06/23/19 1900 06/23/19 2019  BP: 132/83  98/68  Pulse: 95  100  Resp:   18  Temp: (!) 97.4 F (36.3 C)  97.7 F (36.5 C)  TempSrc: Oral    SpO2: 100%  100%  Weight:  93.1 kg   Height:  5\' 4"  (1.626 m)     GENERAL: The patient is a well-nourished female, in no acute distress. The vital signs are documented above. CARDIAC: There is a regular rate and rhythm.  VASCULAR: Nonpalpable pedal pulses PULMONARY: Nonlabored respirations ABDOMEN: Soft and non-tender with normal pitched bowel  sounds.  MUSCULOSKELETAL: There are no major deformities or cyanosis. NEUROLOGIC: No focal weakness or paresthesias are detected. SKIN: See photos below PSYCHIATRIC: The patient has a normal affect.        STUDIES:   Studies are unavailable for review.  Per report, abnormal ABIs.  ASSESSMENT and PLAN   Diabetic left foot infection in the setting of arterial insufficiency: Referring institution performed ABIs which by report are abnormal.  I discussed with the patient that this is a limb threatening situation.  I need to get a better evaluation of her circulation.  We discussed the best way to do that would be to proceed with angiography.  This will be scheduled for tomorrow.  I discussed the details of the procedure as well as risks and benefits.  All questions were answered.  Based off the findings on angiography, further plans for her foot will be made.  This includes toe amputation versus transmetatarsal amputation versus below-knee amputation.  She will be n.p.o. after midnight for her procedure tomorrow   Leia Alf, MD, FACS Vascular and Vein Specialists of  [facility] [phone] Pager [phone]

## 2019-06-23 NOTE — Progress Notes (Signed)
Patient arrived to room 6N25 via stretcher from [facility]. Patient is alert and oriented to self, place, and situation. VSS, NAD. Patient c/o pain in her left foot. Notified bed control of patient's arrival. Bed is in the lowest and locked position with bed rails up times 3. Belongings and call bell within reach.

## 2019-06-23 NOTE — Progress Notes (Signed)
Pharmacy Antibiotic Note  Taylor [name] is a 39 y.o. female admitted on 06/23/2019 with Suspected diabetic foot infection. High risk for MRSA.  Pharmacy has been consulted for vancomycin and cefepime dosing.  Plan: 1. Vancomycin 1750 mg q 24 hrs.  Caclulated AUC = 490 based on Scr 0.75 2. Cefepime 2g q 8 hrs  Height: 5\' 4"  (162.6 cm) Weight: 205 lb 4 oz (93.1 kg) IBW/kg (Calculated) : 54.7  Temp (24hrs), Avg:97.4 F (36.3 C), Min:97.4 F (36.3 C), Max:97.4 F (36.3 C)  Recent Labs  Lab 06/23/19 1911  WBC 9.5  CREATININE 0.75    Estimated Creatinine Clearance: 104.5 mL/min (by C-G formula based on SCr of 0.75 mg/dL).    Allergies  Allergen Reactions  . Contrast Media [Iodinated Diagnostic Agents] Other (See Comments)    Went into kidney failure   . Acetaminophen   . Amitriptyline Other (See Comments)    Unresponsive   . Compazine Other (See Comments)    Unknown  . Oxycodone Nausea And Vomiting    Severe patient will not take oxycodone or percocet.  . Prochlorperazine Other (See Comments)    Alert but unable to move  . Promethazine Swelling    Lips swell  . Sumatriptan Rash    Antimicrobials this admission: Vancomycin 1/10 > Cefepime 1/10 >   Dose adjustments this admission:  Microbiology results: 1/10 BCx x 2 >   Thank you for allowing pharmacy to be a part of this patient's care.  Marguerite Olea, [facility] Clinical Pharmacist Phone [phone]  06/23/2019 8:19 PM

## 2019-06-24 ENCOUNTER — Inpatient Hospital Stay (HOSPITAL_COMMUNITY): Payer: Medicaid Other

## 2019-06-24 DIAGNOSIS — M86172 Other acute osteomyelitis, left ankle and foot: Secondary | ICD-10-CM

## 2019-06-24 DIAGNOSIS — L0390 Cellulitis, unspecified: Secondary | ICD-10-CM

## 2019-06-24 LAB — HEMOGLOBIN A1C
Hgb A1c MFr Bld: 10 % — ABNORMAL HIGH (ref 4.8–5.6)
Mean Plasma Glucose: 240.3 mg/dL

## 2019-06-24 LAB — GLUCOSE, CAPILLARY
Glucose-Capillary: 255 mg/dL — ABNORMAL HIGH (ref 70–99)
Glucose-Capillary: 205 mg/dL — ABNORMAL HIGH (ref 70–99)
Glucose-Capillary: 183 mg/dL — ABNORMAL HIGH (ref 70–99)
Glucose-Capillary: 168 mg/dL — ABNORMAL HIGH (ref 70–99)

## 2019-06-24 LAB — BASIC METABOLIC PANEL
Anion gap: 12 (ref 5–15)
BUN: 9 mg/dL (ref 6–20)
CO2: 29 mmol/L (ref 22–32)
Calcium: 8.4 mg/dL — ABNORMAL LOW (ref 8.9–10.3)
Chloride: 98 mmol/L (ref 98–111)
Creatinine, Ser: 0.77 mg/dL (ref 0.44–1.00)
GFR calc Af Amer: >60 mL/min (ref >60)
GFR calc non Af Amer: >60 mL/min (ref >60)
Glucose, Bld: 310 mg/dL — ABNORMAL HIGH (ref 70–99)
Potassium: 3.4 mmol/L — ABNORMAL LOW (ref 3.5–5.1)
Sodium: 139 mmol/L (ref 135–145)

## 2019-06-24 LAB — MAGNESIUM: Magnesium: 1.4 mg/dL — ABNORMAL LOW (ref 1.7–2.4)

## 2019-06-24 LAB — CBC
HCT: 32 % — ABNORMAL LOW (ref 36.0–46.0)
Hemoglobin: 9.1 g/dL — ABNORMAL LOW (ref 12.0–15.0)
MCH: 22.5 pg — ABNORMAL LOW (ref 26.0–34.0)
MCHC: 28.4 g/dL — ABNORMAL LOW (ref 30.0–36.0)
MCV: 79.2 fL — ABNORMAL LOW (ref 80.0–100.0)
Platelets: 390 10*3/uL (ref 150–400)
RBC: 4.04 MIL/uL (ref 3.87–5.11)
RDW: 15.8 % — ABNORMAL HIGH (ref 11.5–15.5)
WBC: 11.1 10*3/uL — ABNORMAL HIGH (ref 4.0–10.5)
nRBC: 0 % (ref 0.0–0.2)

## 2019-06-24 LAB — PROTIME-INR
INR: 1 (ref 0.8–1.2)
Prothrombin Time: 13.4 seconds (ref 11.4–15.2)

## 2019-06-24 MED ORDER — POLYETHYLENE GLYCOL 3350 17 G PO PACK
17.0000 g | PACK | Freq: Every day | ORAL | Status: DC
  Administered 2019-06-24 – 2019-06-28 (×2): 17 g via ORAL
  Filled 2019-06-24 (×6): qty 1

## 2019-06-24 MED ORDER — CEFAZOLIN SODIUM-DEXTROSE 2-4 GM/100ML-% IV SOLN
2.0000 g | Freq: Three times a day (TID) | INTRAVENOUS | Status: DC
  Administered 2019-06-24 – 2019-07-05 (×34): 2 g via INTRAVENOUS
  Filled 2019-06-24 (×36): qty 100

## 2019-06-24 MED ORDER — ADULT MULTIVITAMIN W/MINERALS CH
1.0000 | ORAL_TABLET | Freq: Every day | ORAL | Status: DC
  Administered 2019-06-24 – 2019-07-05 (×10): 1 via ORAL
  Filled 2019-06-24 (×10): qty 1

## 2019-06-24 MED ORDER — ENSURE MAX PROTEIN PO LIQD
11.0000 [oz_av] | Freq: Every day | ORAL | Status: DC
  Administered 2019-06-28 – 2019-07-04 (×4): 11 [oz_av] via ORAL
  Filled 2019-06-24 (×12): qty 330

## 2019-06-24 MED ORDER — JUVEN PO PACK
1.0000 | PACK | Freq: Two times a day (BID) | ORAL | Status: DC
  Administered 2019-06-24 – 2019-06-29 (×7): 1 via ORAL
  Filled 2019-06-24 (×9): qty 1

## 2019-06-24 MED ORDER — MAGNESIUM SULFATE 4 GM/100ML IV SOLN
4.0000 g | Freq: Once | INTRAVENOUS | Status: AC
  Administered 2019-06-24: 4 g via INTRAVENOUS
  Filled 2019-06-24: qty 100

## 2019-06-24 MED ORDER — INSULIN DETEMIR 100 UNIT/ML ~~LOC~~ SOLN
30.0000 [IU] | Freq: Every day | SUBCUTANEOUS | Status: DC
  Administered 2019-06-24 – 2019-06-29 (×6): 30 [IU] via SUBCUTANEOUS
  Filled 2019-06-24 (×8): qty 0.3

## 2019-06-24 MED ORDER — POTASSIUM CHLORIDE CRYS ER 20 MEQ PO TBCR
40.0000 meq | EXTENDED_RELEASE_TABLET | Freq: Two times a day (BID) | ORAL | Status: DC
  Administered 2019-06-24 – 2019-07-02 (×15): 40 meq via ORAL
  Filled 2019-06-24 (×16): qty 2

## 2019-06-24 NOTE — Progress Notes (Signed)
Pharmacy note - antibiotics  [facility] department to get culture and sensitivity information.   12/29 Blood cx > no growth 1/2 wound culture from I&D >> MSSA (clindamycin resistant) and group b strep (penicillin sensitive)  Asked Dr. Reesa Chew about narrowing spectum of antibiotics.  Orders received to start cefazolin 2g IV q8h.  Heide Guile, PharmD, BCPS-AQ ID Clinical Pharmacist [phone]

## 2019-06-24 NOTE — Plan of Care (Signed)
?  Problem: Clinical Measurements: ?Goal: Ability to maintain clinical measurements within normal limits will improve ?Outcome: Progressing ?Goal: Will remain free from infection ?Outcome: Progressing ?Goal: Diagnostic test results will improve ?Outcome: Progressing ?  ?

## 2019-06-24 NOTE — Progress Notes (Signed)
PROGRESS NOTE    Taylor [name]  [id number] DOB: January 24, 1981 DOA: 06/23/2019 PCP: Monico Blitz, MD   Brief Narrative:  Taylor [name] is a 39 y.o. female with medical history significant for type 2 diabetes, previous stroke, seizures, hypertension, and hyperlipidemia.  Patient was hospitalized at [facility] facility from 06/12/2019 to 06/22/2018 for left foot infection with cellulitis and abscess complicated by left fifth toe osteomyelitis, necrotic ulcer.  I&D was performed and culture grew pan MSSA.  She did had abnormal [name] and she was transferred to [facility] for vascular surgery evaluation.  Initially treated with cefepime and vancomycin Antibiotics narrowed to cefazolin based on culture results.  Subjective: Patient was complaining of left foot pain when seen this morning.  Denies any fever or chills.  No nausea or vomiting.  Assessment & Plan:   Active Problems:   Diabetic foot infection ([place])   PAD (peripheral artery disease) (HCC)   Osteomyelitis of left foot (HCC)  Left foot infection and osteomyelitis of left fifth toe and necrosis: Peripheral arterial disease. [name] shows bilateral lower extremity peripheral arterial disease. Vascular surgery was consulted and patient is going for angiography/angioplasty tomorrow. -Antibiotics narrowed to cefazolin based on her IND culture results. -Further management will be decided by vascular after angiography.  Type 2 diabetes.  Uncontrolled with A1c of 10. CBG elevated in 200s to 300s. -Increase Lantus to 30 units at bedtime. -Continue moderate SSI.  History of CVA.  No new neurologic deficit. S/P endovascular revascularization of left vertebral artery stenosis. -Continue aspirin and statin. -Continue Plavix  Seizure disorder.  No recent seizure-like activity. -Continue Keppra  Bipolar with depression.  -Continue home dose of Cymbalta. -Continue risperidone.  Hypertension.  Currently normotensive. -Continue  lisinopril.  Objective: Vitals:   06/23/19 2019 06/24/19 0654 06/24/19 1407 06/24/19 1642  BP: 98/68 136/75 97/82 137/77  Pulse: 100 100 96 93  Resp: 18 19 (!) 22 (!) 21  Temp: 97.7 F (36.5 C) 99.3 F (37.4 C) 98.4 F (36.9 C) 98.7 F (37.1 C)  TempSrc:  Oral Oral Oral  SpO2: 100% 98% 96% 98%  Weight:      Height:        Intake/Output Summary (Last 24 hours) at 06/24/2019 1727 Last data filed at 06/24/2019 1646 Gross per 24 hour  Intake 855.28 ml  Output 250 ml  Net 605.28 ml   Filed Weights   06/23/19 1900  Weight: 93.1 kg    Examination:  General exam: Appears calm and comfortable  Respiratory system: Clear to auscultation. Respiratory effort normal. Cardiovascular system: S1 & S2 heard, RRR. No JVD, murmurs, rubs, gallops or clicks. Gastrointestinal system: Soft, nontender, nondistended, bowel sounds positive. Central nervous system: Alert and oriented. No focal neurological deficits.Symmetric 5 x 5 power. Extremities: Left foot with clean bandage.  Pulses diminished bilaterally. Skin: No rashes, lesions or ulcers Psychiatry: Judgement and insight appear normal. Mood & affect appropriate.    DVT prophylaxis: Lovenox Code Status: Full Family Communication: No family at bedside Disposition Plan: Pending improvement.  Consultants:   Vascular surgery  Procedures:  Antimicrobials:  Cefazolin  Data Reviewed: I have personally reviewed following labs and imaging studies  CBC: Recent Labs  Lab 06/23/19 1911 06/24/19 0113  WBC 9.5 11.1*  NEUTROABS 7.1  --   HGB 8.7* 9.1*  HCT 30.3* 32.0*  MCV 78.3* 79.2*  PLT 388 [id number]   Basic Metabolic Panel: Recent Labs  Lab 06/23/19 1911 06/24/19 0113  NA 139 139  K 3.2* 3.4*  CL 100 98  CO2 27 29  GLUCOSE 267* 310*  BUN 11 9  CREATININE 0.75 0.77  CALCIUM 8.5* 8.4*  MG  --  1.4*   GFR: Estimated Creatinine Clearance: 104.5 mL/min (by C-G formula based on SCr of 0.77 mg/dL). Liver Function Tests: No  results for input(s): AST, ALT, ALKPHOS, BILITOT, PROT, ALBUMIN in the last 168 hours. No results for input(s): LIPASE, AMYLASE in the last 168 hours. No results for input(s): AMMONIA in the last 168 hours. Coagulation Profile: Recent Labs  Lab 06/24/19 0113  INR 1.0   Cardiac Enzymes: No results for input(s): CKTOTAL, CKMB, CKMBINDEX, TROPONINI in the last 168 hours. BNP (last 3 results) No results for input(s): PROBNP in the last 8760 hours. HbA1C: Recent Labs    06/23/19 1911  HGBA1C 10.0*   CBG: Recent Labs  Lab 06/23/19 1837 06/23/19 2018 06/24/19 0823 06/24/19 1140  GLUCAP 249* 321* 255* 205*   Lipid Profile: No results for input(s): CHOL, HDL, LDLCALC, TRIG, CHOLHDL, LDLDIRECT in the last 72 hours. Thyroid Function Tests: No results for input(s): TSH, T4TOTAL, FREET4, T3FREE, THYROIDAB in the last 72 hours. Anemia Panel: No results for input(s): VITAMINB12, FOLATE, FERRITIN, TIBC, IRON, RETICCTPCT in the last 72 hours. Sepsis Labs: No results for input(s): PROCALCITON, LATICACIDVEN in the last 168 hours.  Recent Results (from the past 240 hour(s))  Blood Cultures x 2 sites     Status: None (Preliminary result)   Collection Time: 06/23/19  7:01 PM   Specimen: BLOOD LEFT ARM  Result Value Ref Range Status   Specimen Description BLOOD LEFT ARM  Final   Special Requests   Final    BOTTLES DRAWN AEROBIC ONLY Blood Culture results may not be optimal due to an inadequate volume of blood received in culture bottles   Culture   Final    NO GROWTH < 12 HOURS Performed at [facility], [place] [address] [place], [place] [address]    Report Status PENDING  Incomplete  Blood Cultures x 2 sites     Status: None (Preliminary result)   Collection Time: 06/23/19  7:11 PM   Specimen: BLOOD RIGHT ARM  Result Value Ref Range Status   Specimen Description BLOOD RIGHT ARM  Final   Special Requests   Final    BOTTLES DRAWN AEROBIC ONLY Blood Culture results may not be  optimal due to an inadequate volume of blood received in culture bottles   Culture   Final    NO GROWTH < 12 HOURS Performed at [facility], [place] [address], [place], [place] [address]    Report Status PENDING  Incomplete     Radiology Studies: VAS [place] [name] WITH/WO TBI  Result Date: 06/24/2019 LOWER EXTREMITY DOPPLER STUDY Indications: Ulceration. High Risk Factors: Hypertension, Diabetes.  Comparison Study: no prior Performing Technologist: Abram Sander RVS  Examination Guidelines: A complete evaluation includes at minimum, Doppler waveform signals and systolic blood pressure reading at the level of bilateral brachial, anterior tibial, and posterior tibial arteries, when vessel segments are accessible. Bilateral testing is considered an integral part of a complete examination. Photoelectric Plethysmograph (PPG) waveforms and toe systolic pressure readings are included as required and additional duplex testing as needed. Limited examinations for reoccurring indications may be performed as noted.  [name] Findings: +--------+------------------+-----+----------+--------+ Right   Rt Pressure (mmHg)IndexWaveform  Comment  +--------+------------------+-----+----------+--------+ [id number]                    triphasic          +--------+------------------+-----+----------+--------+  PTA     75                0.61 monophasic         +--------+------------------+-----+----------+--------+ DP      75                0.61 biphasic           +--------+------------------+-----+----------+--------+ +--------+------------------+-----+-------------------+-----------+ Left    Lt Pressure (mmHg)IndexWaveform           Comment     +--------+------------------+-----+-------------------+-----------+ [id number]                    triphasic                      +--------+------------------+-----+-------------------+-----------+ PTA                                               not  audible +--------+------------------+-----+-------------------+-----------+ DP      56                0.46 dampened monophasic            +--------+------------------+-----+-------------------+-----------+ +-------+-----------+-----------+------------+------------+ [name]/TBIToday's ABIToday's TBIPrevious ABIPrevious TBI +-------+-----------+-----------+------------+------------+ Right  0.61                                           +-------+-----------+-----------+------------+------------+ Left   0.46                                           +-------+-----------+-----------+------------+------------+  Summary: Right: Resting right ankle-brachial index indicates moderate right lower extremity arterial disease. Left: Resting left ankle-brachial index indicates severe left lower extremity arterial disease.  *See table(s) above for measurements and observations.  Electronically signed by Curt Jews MD on 06/24/2019 at 5:15:15 PM.   Final     Scheduled Meds: . aspirin  325 mg Oral Daily  . atorvastatin  40 mg Oral q1800  . canagliflozin  100 mg Oral QAC breakfast  . [START ON 06/25/2019] clopidogrel  75 mg Oral Daily  . diazepam  5 mg Oral BID  . DULoxetine  30 mg Oral Daily  . enoxaparin (LOVENOX) injection  40 mg Subcutaneous Daily  . gabapentin  900 mg Oral TID  . insulin aspart  0-15 Units Subcutaneous TID WC  . insulin aspart  0-5 Units Subcutaneous QHS  . insulin detemir  25 Units Subcutaneous QHS  . levETIRAcetam  500 mg Oral BID  . lisinopril  20 mg Oral Daily  . multivitamin with minerals  1 tablet Oral Daily  . nutrition supplement (JUVEN)  1 packet Oral BID BM  . pantoprazole  40 mg Oral Daily  . polyethylene glycol  17 g Oral Daily  . potassium chloride  40 mEq Oral BID WC  . Ensure Max Protein  11 oz Oral QHS  . risperiDONE  0.5 mg Oral QHS  . traZODone  200 mg Oral QHS   Continuous Infusions: .  ceFAZolin (ANCEF) IV 2 g (06/24/19 1455)  . magnesium sulfate  bolus IVPB 4 g (06/24/19 1549)     LOS: 1 day   Time spent: 35  minutes.  Lorella Nimrod, MD [facility] Pager [phone]  If 7PM-7AM, please contact night-coverage [email or web address] Password [facility] 06/24/2019, 5:27 PM   This record has been created using Dragon voice recognition software. Errors have been sought and corrected,but may not always be located. Such creation errors do not reflect on the standard of care.

## 2019-06-24 NOTE — Plan of Care (Signed)
  Problem: Education: Goal: Knowledge of General Education information will improve Description: Including pain rating scale, medication(s)/side effects and non-pharmacologic comfort measures Outcome: Progressing   Problem: Health Behavior/Discharge Planning: Goal: Ability to manage health-related needs will improve Outcome: Progressing   Problem: Activity: Goal: Risk for activity intolerance will decrease Outcome: Progressing   Problem: Nutrition: Goal: Adequate nutrition will be maintained Outcome: Progressing   Problem: Elimination: Goal: Will not experience complications related to bowel motility Outcome: Progressing Goal: Will not experience complications related to urinary retention Outcome: Progressing   Problem: Pain Managment: Goal: General experience of comfort will improve Outcome: Progressing   Problem: Safety: Goal: Ability to remain free from injury will improve Outcome: Progressing   Problem: Skin Integrity: Goal: Risk for impaired skin integrity will decrease Outcome: Progressing   

## 2019-06-24 NOTE — Progress Notes (Signed)
Initial Nutrition Assessment  DOCUMENTATION CODES:   Obesity unspecified  INTERVENTION:   -MVI with minerals daily -Ensure Max po daily, each supplement provides 150 kcal and 30 grams of protein.  -1 packet Juven BID, each packet provides 95 calories, 2.5 grams of protein (collagen), and 9.8 grams of carbohydrate (3 grams sugar); also contains 7 grams of L-arginine and L-glutamine, 300 mg vitamin C, 15 mg vitamin E, 1.2 mcg vitamin B-12, 9.5 mg zinc, 200 mg calcium, and 1.5 g  Calcium Beta-hydroxy-Beta-methylbutyrate to support wound healing  NUTRITION DIAGNOSIS:   Increased nutrient needs related to wound healing as evidenced by estimated needs.  GOAL:   Patient will meet greater than or equal to 90% of their needs  MONITOR:   PO intake, Supplement acceptance, Labs, Weight trends, Skin, I & O's  REASON FOR ASSESSMENT:   Consult Wound healing  ASSESSMENT:   Taylor [name] is a 39 y.o. female with medical history significant for type 2 diabetes, previous stroke, seizures, hypertension, and hyperlipidemia.  Pt admitted with lt foot infection and osteomyelitis of lt fifth toe and necrosis.   1/2- s/p I&D at [place] I/O's: +585 ml x 24 hours  Attempted to speak with pt via phone, however, no answer. RD unable to obtain further nutrition-related history at this time.   Per [facility] notes, plan for angiogram tomorrow. Pt may require toe amputation vs transmetatarsal amputation vs BKA depending on results.   Noted pt with good appetite. Noted meal completion 90%.   Reviewed wt hx; noted pt has experienced a 6.2% wt loss over the past 6 months. While this is not significant for time frame, it is concerning given pt's history of uncontrolled diabetes and multiple co-morbidities.   Lab Results  Component Value Date   HGBA1C 10.0 (H) 06/23/2019   PTA DM medications are 10 mg farxiga every morning .   Labs reviewed :K: 3.4 (on PO supplementation), Mg: 1.4 (on IV  supplementation), CBGS: 205-321 (inpatient orders for glycemic control are 100 mg invokana daily before breakfast, 0-15 units insulin aspart TID with meals, 0-5 units insulin aspart q HS, and 25 units insulin determir daily at bedtime).   Diet Order:   Diet Order            Diet NPO time specified Except for: Sips with Meds  Diet effective midnight        Diet Carb Modified Fluid consistency: Thin; Room service appropriate? Yes  Diet effective now              EDUCATION NEEDS:   No education needs have been identified at this time  Skin:  Skin Assessment: Skin Integrity Issues: Skin Integrity Issues:: Other (Comment) Other: MASD lt and rt buttocks  Last BM:  06/23/19  Height:   Ht Readings from Last 1 Encounters:  06/23/19 5\' 4"  (1.626 m)    Weight:   Wt Readings from Last 1 Encounters:  06/23/19 93.1 kg    Ideal Body Weight:  54.5 kg  BMI:  Body mass index is 35.23 kg/m.  Estimated Nutritional Needs:   Kcal:  1900-2100  Protein:  95-110 grams  Fluid:  > 1.9 L    Ocie Stanzione A. Jimmye Norman, RD, LDN, [place] Registered Dietitian II Certified Diabetes Care and Education Specialist Pager: [phone] After hours Pager: [phone]

## 2019-06-24 NOTE — Progress Notes (Signed)
   06/24/19 1407  MEWS Score  Resp (!) 22 (nurse notified)  Pulse Rate 96  BP 97/82  Temp 98.4 F (36.9 C)  SpO2 96 %  O2 Device Room Air  MEWS Score  MEWS RR 1  MEWS Pulse 0  MEWS Systolic 1  MEWS LOC 0  MEWS Temp 0  MEWS Score 2  MEWS Score Color Yellow  MEWS Assessment  Is this an acute change? Yes  MEWS guidelines implemented *See Row Information* Yellow  Provider Notification  Provider Name/Title Lorella Nimrod, MD  Date Provider Notified 06/24/19  Time Provider Notified 1430  Notification Type Page  Notification Reason Other (Comment) (per protocol)

## 2019-06-24 NOTE — Consult Note (Signed)
WOC consult for left foot wound requested prior to vascular team involvement.  Pt has osteomylitis, eschar to the wound, and an abnormal [name].  This complex medical condition is beyond the scope of practice for [place] nurses.  Vascular team is following for assessment and plan of care; please refer to their team for further questions.  Please re-consult if further assistance is needed.  Thank-you,  Julien Girt MSN, [place], [place], [place], [place]

## 2019-06-24 NOTE — Progress Notes (Signed)
[name] has been completed.   Preliminary results in CV Proc.   Taylor [name] 06/24/2019 2:08 PM

## 2019-06-24 NOTE — Plan of Care (Signed)

## 2019-06-24 NOTE — Social Work (Signed)
CSW acknowledging consult for [facility] services and DME, as well as medication assistance. Will follow for therapy recommendations needed to best determine disposition/for insurance authorization.  At this time pt has Medicaid, will discuss with RNCM any additional medication assistance programs.    Westley Hummer, MSW, [place] Work

## 2019-06-25 ENCOUNTER — Encounter (HOSPITAL_COMMUNITY)
Admission: AD | Disposition: A | Discharge status: Home Health | Payer: Self-pay | Source: Other Acute Inpatient Hospital | Attending: Internal Medicine

## 2019-06-25 ENCOUNTER — Ambulatory Visit (HOSPITAL_COMMUNITY): Admit: 2019-06-25 | Payer: Medicaid Other | Admitting: Surgery

## 2019-06-25 DIAGNOSIS — M86172 Other acute osteomyelitis, left ankle and foot: Secondary | ICD-10-CM

## 2019-06-25 HISTORY — PX: PERIPHERAL VASCULAR ATHERECTOMY: CATH118256

## 2019-06-25 HISTORY — PX: ABDOMINAL AORTOGRAM W/LOWER EXTREMITY: CATH118223

## 2019-06-25 LAB — CBC
HCT: 30.1 % — ABNORMAL LOW (ref 36.0–46.0)
Hemoglobin: 8.6 g/dL — ABNORMAL LOW (ref 12.0–15.0)
MCH: 22.3 pg — ABNORMAL LOW (ref 26.0–34.0)
MCHC: 28.6 g/dL — ABNORMAL LOW (ref 30.0–36.0)
MCV: 78 fL — ABNORMAL LOW (ref 80.0–100.0)
Platelets: 353 10*3/uL (ref 150–400)
RBC: 3.86 MIL/uL — ABNORMAL LOW (ref 3.87–5.11)
RDW: 15.9 % — ABNORMAL HIGH (ref 11.5–15.5)
WBC: 9.2 10*3/uL (ref 4.0–10.5)
nRBC: 0 % (ref 0.0–0.2)

## 2019-06-25 LAB — BASIC METABOLIC PANEL
Anion gap: 10 (ref 5–15)
BUN: 10 mg/dL (ref 6–20)
CO2: 27 mmol/L (ref 22–32)
Calcium: 8.6 mg/dL — ABNORMAL LOW (ref 8.9–10.3)
Chloride: 103 mmol/L (ref 98–111)
Creatinine, Ser: 0.74 mg/dL (ref 0.44–1.00)
GFR calc Af Amer: >60 mL/min (ref >60)
GFR calc non Af Amer: >60 mL/min (ref >60)
Glucose, Bld: 231 mg/dL — ABNORMAL HIGH (ref 70–99)
Potassium: 3.9 mmol/L (ref 3.5–5.1)
Sodium: 140 mmol/L (ref 135–145)

## 2019-06-25 LAB — GLUCOSE, CAPILLARY
Glucose-Capillary: 174 mg/dL — ABNORMAL HIGH (ref 70–99)
Glucose-Capillary: 344 mg/dL — ABNORMAL HIGH (ref 70–99)
Glucose-Capillary: 299 mg/dL — ABNORMAL HIGH (ref 70–99)

## 2019-06-25 LAB — MAGNESIUM: Magnesium: 2.2 mg/dL (ref 1.7–2.4)

## 2019-06-25 LAB — PREGNANCY, URINE: Preg Test, Ur: NEGATIVE

## 2019-06-25 SURGERY — ABDOMINAL AORTOGRAM W/LOWER EXTREMITY
Anesthesia: LOCAL

## 2019-06-25 MED ORDER — MIDAZOLAM HCL 2 MG/2ML IJ SOLN
INTRAMUSCULAR | Status: AC
  Filled 2019-06-25: qty 2

## 2019-06-25 MED ORDER — HEPARIN (PORCINE) IN NACL 1000-0.9 UT/500ML-% IV SOLN
INTRAVENOUS | Status: AC
  Filled 2019-06-25: qty 1000

## 2019-06-25 MED ORDER — SODIUM CHLORIDE 0.9% FLUSH: 3.0000 mL | INTRAVENOUS | Status: DC | PRN

## 2019-06-25 MED ORDER — NYSTATIN 100000 UNIT/GM EX CREA
TOPICAL_CREAM | Freq: Two times a day (BID) | CUTANEOUS | Status: DC
  Administered 2019-06-26 – 2019-07-01 (×3): 1 via TOPICAL
  Filled 2019-06-25 (×2): qty 15

## 2019-06-25 MED ORDER — SODIUM CHLORIDE 0.9% FLUSH
3.0000 mL | Freq: Two times a day (BID) | INTRAVENOUS | Status: DC
  Administered 2019-06-26 – 2019-07-04 (×4): 3 mL via INTRAVENOUS

## 2019-06-25 MED ORDER — HYDROMORPHONE HCL 1 MG/ML IJ SOLN
INTRAMUSCULAR | Status: AC
  Filled 2019-06-25: qty 0.5

## 2019-06-25 MED ORDER — SODIUM CHLORIDE 0.9 % IV SOLN: 250.0000 mL | INTRAVENOUS | Status: DC | PRN

## 2019-06-25 MED ORDER — METHYLPREDNISOLONE SODIUM SUCC 125 MG IJ SOLR
125.0000 mg | Freq: Once | INTRAMUSCULAR | Status: AC
  Administered 2019-06-25: 125 mg via INTRAVENOUS

## 2019-06-25 MED ORDER — DIPHENHYDRAMINE HCL 50 MG/ML IJ SOLN
50.0000 mg | Freq: Once | INTRAMUSCULAR | Status: AC
  Administered 2019-06-25: 50 mg via INTRAVENOUS

## 2019-06-25 MED ORDER — LIDOCAINE HCL (PF) 1 % IJ SOLN
INTRAMUSCULAR | Status: AC
  Filled 2019-06-25: qty 30

## 2019-06-25 MED ORDER — ONDANSETRON HCL 4 MG/2ML IJ SOLN: 4.0000 mg | Freq: Four times a day (QID) | INTRAMUSCULAR | Status: DC | PRN

## 2019-06-25 MED ORDER — HYDROCODONE-ACETAMINOPHEN 5-325 MG PO TABS
ORAL_TABLET | ORAL | Status: AC
  Filled 2019-06-25: qty 1

## 2019-06-25 MED ORDER — VIPERSLIDE LUBRICANT OPTIME: TOPICAL | Status: DC | PRN

## 2019-06-25 MED ORDER — HEPARIN (PORCINE) IN NACL 1000-0.9 UT/500ML-% IV SOLN
INTRAVENOUS | Status: DC | PRN
  Administered 2019-06-25 (×2): 500 mL

## 2019-06-25 MED ORDER — SODIUM CHLORIDE 0.9 % IV SOLN: INTRAVENOUS | Status: DC

## 2019-06-25 MED ORDER — DIPHENHYDRAMINE HCL 50 MG/ML IJ SOLN
INTRAMUSCULAR | Status: AC
  Filled 2019-06-25: qty 1

## 2019-06-25 MED ORDER — METHYLPREDNISOLONE SODIUM SUCC 125 MG IJ SOLR
INTRAMUSCULAR | Status: AC
  Filled 2019-06-25: qty 2

## 2019-06-25 MED ORDER — ACETAMINOPHEN 325 MG PO TABS
650.0000 mg | ORAL_TABLET | ORAL | Status: DC | PRN
  Administered 2019-06-25 – 2019-06-26 (×3): 650 mg via ORAL
  Filled 2019-06-25 (×4): qty 2

## 2019-06-25 MED ORDER — FENTANYL CITRATE (PF) 100 MCG/2ML IJ SOLN
INTRAMUSCULAR | Status: AC
  Filled 2019-06-25: qty 2

## 2019-06-25 MED ORDER — LABETALOL HCL 5 MG/ML IV SOLN
INTRAVENOUS | Status: AC
  Filled 2019-06-25: qty 4

## 2019-06-25 MED ORDER — HEPARIN SODIUM (PORCINE) 1000 UNIT/ML IJ SOLN
INTRAMUSCULAR | Status: DC | PRN
  Administered 2019-06-25: 10000 [IU] via INTRAVENOUS

## 2019-06-25 MED ORDER — LABETALOL HCL 5 MG/ML IV SOLN: 10.0000 mg | INTRAVENOUS | Status: DC | PRN

## 2019-06-25 MED ORDER — FLUCONAZOLE 200 MG PO TABS
200.0000 mg | ORAL_TABLET | Freq: Every day | ORAL | Status: AC
  Administered 2019-06-25 – 2019-06-26 (×2): 200 mg via ORAL
  Filled 2019-06-25 (×2): qty 1

## 2019-06-25 MED ORDER — LIDOCAINE HCL (PF) 1 % IJ SOLN
INTRAMUSCULAR | Status: DC | PRN
  Administered 2019-06-25: 20 mL

## 2019-06-25 MED ORDER — SODIUM CHLORIDE 0.9 % WEIGHT BASED INFUSION
1.0000 mL/kg/h | INTRAVENOUS | Status: AC
  Administered 2019-06-25: 1 mL/kg/h via INTRAVENOUS

## 2019-06-25 MED ORDER — HYDRALAZINE HCL 20 MG/ML IJ SOLN: 5.0000 mg | INTRAMUSCULAR | Status: DC | PRN

## 2019-06-25 MED ORDER — HEPARIN SODIUM (PORCINE) 1000 UNIT/ML IJ SOLN
INTRAMUSCULAR | Status: AC
  Filled 2019-06-25: qty 1

## 2019-06-25 MED ORDER — MIDAZOLAM HCL 2 MG/2ML IJ SOLN
INTRAMUSCULAR | Status: DC | PRN
  Administered 2019-06-25 (×2): 1 mg via INTRAVENOUS
  Administered 2019-06-25: 2 mg via INTRAVENOUS

## 2019-06-25 MED ORDER — IODIXANOL 320 MG/ML IV SOLN
INTRAVENOUS | Status: DC | PRN
  Administered 2019-06-25: 135 mL via INTRA_ARTERIAL

## 2019-06-25 MED ORDER — ASPIRIN EC 81 MG PO TBEC
81.0000 mg | DELAYED_RELEASE_TABLET | Freq: Every day | ORAL | Status: DC
  Administered 2019-06-26 – 2019-07-05 (×9): 81 mg via ORAL
  Filled 2019-06-25 (×9): qty 1

## 2019-06-25 MED ORDER — LABETALOL HCL 5 MG/ML IV SOLN
INTRAVENOUS | Status: DC | PRN
  Administered 2019-06-25: 10 mg via INTRAVENOUS

## 2019-06-25 MED ORDER — FENTANYL CITRATE (PF) 100 MCG/2ML IJ SOLN
INTRAMUSCULAR | Status: DC | PRN
  Administered 2019-06-25 (×3): 25 ug via INTRAVENOUS
  Administered 2019-06-25: 50 ug via INTRAVENOUS
  Administered 2019-06-25: 25 ug via INTRAVENOUS

## 2019-06-25 MED ORDER — CLOPIDOGREL BISULFATE 75 MG PO TABS
75.0000 mg | ORAL_TABLET | Freq: Every day | ORAL | Status: DC
  Administered 2019-06-26 – 2019-07-05 (×9): 75 mg via ORAL
  Filled 2019-06-25 (×9): qty 1

## 2019-06-25 SURGICAL SUPPLY — 28 items
BUR JETSTREAM XC 2.1/3.0 (BURR) IMPLANT
BURR JETSTREAM XC 2.1/3.0 (BURR) ×3
CATH OMNI FLUSH 5F 65CM (CATHETERS) ×1 IMPLANT
CATH QUICKCROSS SUPP .018X90CM (MICROCATHETER) ×1 IMPLANT
CLOSURE MYNX CONTROL 6F/7F (Vascular Products) ×1 IMPLANT
DCB RANGER 4.0X100 135 (BALLOONS) IMPLANT
DCB RANGER 4.0X200 150 (BALLOONS) IMPLANT
DEVICE EMBOSHIELD NAV6 4.0-7.0 (FILTER) ×1 IMPLANT
DRAPE ZERO GRAVITY STERILE (DRAPES) ×1 IMPLANT
KIT ENCORE 26 ADVANTAGE (KITS) ×1 IMPLANT
KIT MICROPUNCTURE NIT STIFF (SHEATH) ×1 IMPLANT
KIT PV (KITS) ×3 IMPLANT
LUBRICANT VIPERSLIDE CORONARY (MISCELLANEOUS) ×1 IMPLANT
RANGER DCB 4.0X100 135 (BALLOONS) ×3
RANGER DCB 4.0X200 150 (BALLOONS) ×3
SHEATH PINNACLE 5F 10CM (SHEATH) ×1 IMPLANT
SHEATH PINNACLE 7F 10CM (SHEATH) ×1 IMPLANT
SHEATH PINNACLE MP 7F 45CM (SHEATH) ×1 IMPLANT
SHEATH PROBE COVER 6X72 (BAG) ×1 IMPLANT
SHIELD RADPAD SCOOP 12X17 (MISCELLANEOUS) ×1 IMPLANT
SYR MEDRAD MARK V 150ML (SYRINGE) ×1 IMPLANT
TAPE VIPERTRACK RADIOPAQ (MISCELLANEOUS) IMPLANT
TAPE VIPERTRACK RADIOPAQUE (MISCELLANEOUS) ×3
TRANSDUCER W/STOPCOCK (MISCELLANEOUS) ×3 IMPLANT
TRAY PV CATH (CUSTOM PROCEDURE TRAY) ×3 IMPLANT
WIRE BAREWIRE WORK .014X315CM (WIRE) ×1 IMPLANT
WIRE BENTSON .035X145CM (WIRE) ×1 IMPLANT
WIRE G V18X300CM (WIRE) ×1 IMPLANT

## 2019-06-25 NOTE — Progress Notes (Signed)
PROGRESS NOTE    Taylor [name]  [id number] DOB: 1980-11-01 DOA: 06/23/2019 PCP: Monico Blitz, MD   Brief Narrative:  Taylor [name] is a 39 y.o. female with medical history significant for type 2 diabetes, previous stroke, seizures, hypertension, and hyperlipidemia.  Patient was hospitalized at [facility] facility from 06/12/2019 to 06/22/2018 for left foot infection with cellulitis and abscess complicated by left fifth toe osteomyelitis, necrotic ulcer.  I&D was performed and culture grew pan MSSA.  She did had abnormal [name] and she was transferred to [facility] for vascular surgery evaluation.  Initially treated with cefepime and vancomycin Antibiotics narrowed to cefazolin based on culture results.  Subjective: Patient was leaving for her angiography when seen this morning.  Assessment & Plan:   Active Problems:   Diabetic foot infection ([place])   PAD (peripheral artery disease) (HCC)   Osteomyelitis of left foot (HCC)  Left foot infection and osteomyelitis of left fifth toe and necrosis: Peripheral arterial disease. [name] shows bilateral lower extremity peripheral arterial disease. Vascular surgery was consulted and patient is going for angiography/angioplasty tomorrow. -Antibiotics narrowed to cefazolin based on her IND culture results. -Further management will be decided by vascular after angiography.  Type 2 diabetes.  Uncontrolled with A1c of 10. CBG elevated in 200s to 300s. -Increase Lantus to 30 units at bedtime. -Continue moderate SSI.  History of CVA.  No new neurologic deficit. S/P endovascular revascularization of left vertebral artery stenosis. -Continue aspirin and statin. -Continue Plavix  Seizure disorder.  No recent seizure-like activity. -Continue Keppra  Bipolar with depression.  -Continue home dose of Cymbalta. -Continue risperidone.  Hypertension.  Currently normotensive. -Continue lisinopril.  Objective: Vitals:   06/25/19 1320 06/25/19 1355  06/25/19 1420 06/25/19 1439  BP: (!) 154/91 (!) 147/85 139/78   Pulse: 94 (!) 119 (!) 106 (!) 106  Resp: (!) 25 15 19 20   Temp:      TempSrc:      SpO2: 93% 100% 100% 99%  Weight:      Height:        Intake/Output Summary (Last 24 hours) at 06/25/2019 1524 Last data filed at 06/25/2019 0600 Gross per 24 hour  Intake 300 ml  Output 1400 ml  Net -1100 ml   Filed Weights   06/23/19 1900  Weight: 93.1 kg    Examination:  General exam: Appears calm and comfortable  Respiratory system: Clear to auscultation. Respiratory effort normal. Cardiovascular system: S1 & S2 heard, RRR. No JVD, murmurs, rubs, gallops or clicks. Gastrointestinal system: Soft, nontender, nondistended, bowel sounds positive. Central nervous system: Alert and oriented. No focal neurological deficits.Symmetric 5 x 5 power. Extremities: Left foot with clean bandage.  Pulses diminished bilaterally. Skin: No rashes, lesions or ulcers Psychiatry: Judgement and insight appear normal. Mood & affect appropriate.    DVT prophylaxis: Lovenox Code Status: Full Family Communication: No family at bedside Disposition Plan: Pending improvement.  Consultants:   Vascular surgery  Procedures:  Antimicrobials:  Cefazolin  Data Reviewed: I have personally reviewed following labs and imaging studies  CBC: Recent Labs  Lab 06/23/19 1911 06/24/19 0113 06/25/19 0214  WBC 9.5 11.1* 9.2  NEUTROABS 7.1  --   --   HGB 8.7* 9.1* 8.6*  HCT 30.3* 32.0* 30.1*  MCV 78.3* 79.2* 78.0*  PLT 388 390 0000000   Basic Metabolic Panel: Recent Labs  Lab 06/23/19 1911 06/24/19 0113 06/25/19 0214  NA 139 139 140  K 3.2* 3.4* 3.9  CL 100 98 103  CO2 27 29 27   GLUCOSE 267* 310* 231*  BUN 11 9 10   CREATININE 0.75 0.77 0.74  CALCIUM 8.5* 8.4* 8.6*  MG  --  1.4* 2.2   GFR: Estimated Creatinine Clearance: 104.5 mL/min (by C-G formula based on SCr of 0.74 mg/dL). Liver Function Tests: No results for input(s): AST, ALT, ALKPHOS,  BILITOT, PROT, ALBUMIN in the last 168 hours. No results for input(s): LIPASE, AMYLASE in the last 168 hours. No results for input(s): AMMONIA in the last 168 hours. Coagulation Profile: Recent Labs  Lab 06/24/19 0113  INR 1.0   Cardiac Enzymes: No results for input(s): CKTOTAL, CKMB, CKMBINDEX, TROPONINI in the last 168 hours. BNP (last 3 results) No results for input(s): PROBNP in the last 8760 hours. HbA1C: Recent Labs    06/23/19 1911  HGBA1C 10.0*   CBG: Recent Labs  Lab 06/24/19 0823 06/24/19 1140 06/24/19 1740 06/24/19 2134 06/25/19 0759  GLUCAP 255* 205* 183* 168* 174*   Lipid Profile: No results for input(s): CHOL, HDL, LDLCALC, TRIG, CHOLHDL, LDLDIRECT in the last 72 hours. Thyroid Function Tests: No results for input(s): TSH, T4TOTAL, FREET4, T3FREE, THYROIDAB in the last 72 hours. Anemia Panel: No results for input(s): VITAMINB12, FOLATE, FERRITIN, TIBC, IRON, RETICCTPCT in the last 72 hours. Sepsis Labs: No results for input(s): PROCALCITON, LATICACIDVEN in the last 168 hours.  Recent Results (from the past 240 hour(s))  Blood Cultures x 2 sites     Status: None (Preliminary result)   Collection Time: 06/23/19  7:01 PM   Specimen: BLOOD LEFT ARM  Result Value Ref Range Status   Specimen Description BLOOD LEFT ARM  Final   Special Requests   Final    BOTTLES DRAWN AEROBIC ONLY Blood Culture results may not be optimal due to an inadequate volume of blood received in culture bottles   Culture   Final    NO GROWTH 2 DAYS Performed at [facility], [place] [address], [place], [place] [address]    Report Status PENDING  Incomplete  Blood Cultures x 2 sites     Status: None (Preliminary result)   Collection Time: 06/23/19  7:11 PM   Specimen: BLOOD RIGHT ARM  Result Value Ref Range Status   Specimen Description BLOOD RIGHT ARM  Final   Special Requests   Final    BOTTLES DRAWN AEROBIC ONLY Blood Culture results may not be optimal due to an inadequate  volume of blood received in culture bottles   Culture   Final    NO GROWTH 2 DAYS Performed at [facility], [place] [address], [place], [place] [address]    Report Status PENDING  Incomplete     Radiology Studies: PERIPHERAL VASCULAR CATHETERIZATION  Result Date: 06/25/2019 Patient name: CESLEY BRACCIA MRN: [id number] DOB: 1980-12-25 Sex: female 06/25/2019 Pre-operative Diagnosis: Left leg ulcer Post-operative diagnosis:  Same Surgeon:  [name] Procedure Performed:  1.  Ultrasound-guided access, right femoral artery  2.  Abdominal aortogram  3.  Left lower extremity runoff  4.  Atherectomy, left superficial femoral and popliteal artery  5.  Drug-coated balloon angioplasty, left superficial femoral and popliteal artery  6.  Closure device, Mynx  7.  Conscious sedation, 98 minutes Indications: The patient has osteomyelitis to the left fifth toe and ulcer.  She is here for further evaluation Procedure:  The patient was identified in the holding area and taken to room 8.  The patient was then placed supine on the table and prepped and  draped in the usual sterile fashion.  A time out was called.  Conscious sedation was administered with the use of IV fentanyl and Versed under continuous physician and nurse monitoring.  Heart rate, blood pressure, and oxygen saturation were continuously monitored.  Total sedation time was 36minutes.  Ultrasound was used to evaluate the right common femoral artery.  It was patent .  A digital ultrasound image was acquired.  A micropuncture needle was used to access the right common femoral artery under ultrasound guidance.  An 018 wire was advanced without resistance and a micropuncture sheath was placed.  The 018 wire was removed and a benson wire was placed.  The micropuncture sheath was exchanged for a 5 french sheath.  An omniflush catheter was advanced over the wire to the level of L-1.  An abdominal angiogram was obtained.  Next, using the omniflush catheter and a  benson wire, the aortic bifurcation was crossed and the catheter was placed into theleft external iliac artery and left runoff was obtained.  Findings:  Aortogram: No significant renal artery stenosis.  The infrarenal abdominal aorta is widely patent without stenosis.  Bilateral common and external iliac arteries are widely patent.  Right Lower Extremity: Not evaluated due to contrast utilization  Left Lower Extremity: The left common femoral arteries widely patent.  There are 2 main profunda branches.  1 of which has a 90% stenosis.  The superficial femoral artery is patent however there is a 90% stenosis in its midportion.  The popliteal artery has a short segment occlusion with reconstitution of the popliteal artery at the level patella.  The below-knee popliteal arteries widely patent with three-vessel runoff.  There does appear to be digital artery disease. Intervention: After the above images were acquired the decision made to proceed with intervention.  Over a 035 wire, a 7 French 45 cm Terumo sheath was advanced in the left external iliac artery.  Patient was fully heparinized.  Next using a 018 quick cross catheter and a V-18 wire, the lesion was successfully crossed.  A contrast injection was performed through the sheath which confirmed successful crossing of the lesion.  A 018 bare wire was then placed.  A large NAV-6 filter was then deployed in the below-knee popliteal artery.  I then used the jetstream atherectomy device and perform atherectomy of the superficial femoral and popliteal artery making 1 pass forward in 1 pass backwards.  Next, I used overlapping 4 mm Ranger balloons (6 x 200, 6 x 100) and performed balloon angioplasty of the treated segment for 3-minute inflations at nominal pressure.  Completion imaging revealed complete resolution of the stenosis with no change in runoff.  The filter was then retrieved and removed.  The long sheath was exchanged out for short 7 and a minx was used for  closure. Impression:  #1  90% mid SFA lesion and occluded short segment popliteal artery successfully crossed and treated with jetstream atherectomy and drug-coated balloon angioplasty using a 4 mm balloon.  #2  90% profunda origin stenosis V. [name], M.D., [facility] [place] Office: [phone] Pager:  [phone]  VAS [place] [name] WITH/WO TBI  Result Date: 06/24/2019 LOWER EXTREMITY DOPPLER STUDY Indications: Ulceration. High Risk Factors: Hypertension, Diabetes.  Comparison Study: no prior Performing Technologist: Abram Sander RVS  Examination Guidelines: A complete evaluation includes at minimum, Doppler waveform signals and systolic blood pressure reading at the level of bilateral brachial, anterior tibial, and posterior tibial arteries, when vessel segments are accessible.  Bilateral testing is considered an integral part of a complete examination. Photoelectric Plethysmograph (PPG) waveforms and toe systolic pressure readings are included as required and additional duplex testing as needed. Limited examinations for reoccurring indications may be performed as noted.  [name] Findings: +--------+------------------+-----+----------+--------+ Right   Rt Pressure (mmHg)IndexWaveform  Comment  +--------+------------------+-----+----------+--------+ [id number]                    triphasic          +--------+------------------+-----+----------+--------+ PTA     75                0.61 monophasic         +--------+------------------+-----+----------+--------+ DP      75                0.61 biphasic           +--------+------------------+-----+----------+--------+ +--------+------------------+-----+-------------------+-----------+ Left    Lt Pressure (mmHg)IndexWaveform           Comment     +--------+------------------+-----+-------------------+-----------+ [id number]                    triphasic                       +--------+------------------+-----+-------------------+-----------+ PTA                                               not audible +--------+------------------+-----+-------------------+-----------+ DP      56                0.46 dampened monophasic            +--------+------------------+-----+-------------------+-----------+ +-------+-----------+-----------+------------+------------+ [name]/TBIToday's ABIToday's TBIPrevious ABIPrevious TBI +-------+-----------+-----------+------------+------------+ Right  0.61                                           +-------+-----------+-----------+------------+------------+ Left   0.46                                           +-------+-----------+-----------+------------+------------+  Summary: Right: Resting right ankle-brachial index indicates moderate right lower extremity arterial disease. Left: Resting left ankle-brachial index indicates severe left lower extremity arterial disease.  *See table(s) above for measurements and observations.  Electronically signed by Curt Jews MD on 06/24/2019 at 5:15:15 PM.   Final     Scheduled Meds: . aspirin EC  81 mg Oral Daily  . [MAR Hold] atorvastatin  40 mg Oral q1800  . [MAR Hold] canagliflozin  100 mg Oral QAC breakfast  . [MAR Hold] clopidogrel  75 mg Oral Daily  . [START ON 06/26/2019] clopidogrel  75 mg Oral Q breakfast  . [MAR Hold] diazepam  5 mg Oral BID  . [MAR Hold] DULoxetine  30 mg Oral Daily  . [MAR Hold] enoxaparin (LOVENOX) injection  40 mg Subcutaneous Daily  . fluconazole  200 mg Oral Daily  . [MAR Hold] gabapentin  900 mg Oral TID  . [MAR Hold] insulin aspart  0-15 Units Subcutaneous TID WC  . [MAR Hold] insulin aspart  0-5 Units Subcutaneous QHS  . [MAR Hold] insulin detemir  30 Units Subcutaneous QHS  . [facility]  Hold] levETIRAcetam  500 mg Oral BID  . [MAR Hold] lisinopril  20 mg Oral Daily  . [MAR Hold] multivitamin with minerals  1 tablet Oral Daily  . [MAR Hold]  nutrition supplement (JUVEN)  1 packet Oral BID BM  . nystatin cream   Topical BID  . [MAR Hold] pantoprazole  40 mg Oral Daily  . [MAR Hold] polyethylene glycol  17 g Oral Daily  . [MAR Hold] potassium chloride  40 mEq Oral BID WC  . [MAR Hold] Ensure Max Protein  11 oz Oral QHS  . [MAR Hold] risperiDONE  0.5 mg Oral QHS  . sodium chloride flush  3 mL Intravenous Q12H  . [MAR Hold] traZODone  200 mg Oral QHS   Continuous Infusions: . sodium chloride    . sodium chloride    . sodium chloride    . [MAR Hold]  ceFAZolin (ANCEF) IV 2 g (06/25/19 0508)     LOS: 2 days   Time spent: 30 minutes.  Lorella Nimrod, MD [facility] Pager [phone]  If 7PM-7AM, please contact night-coverage [email or web address] Password [facility] 06/25/2019, 3:24 PM   This record has been created using Dragon voice recognition software. Errors have been sought and corrected,but may not always be located. Such creation errors do not reflect on the standard of care.

## 2019-06-25 NOTE — Plan of Care (Signed)
  Problem: Education: Goal: Knowledge of General Education information will improve Description: Including pain rating scale, medication(s)/side effects and non-pharmacologic comfort measures Outcome: Progressing   Problem: Health Behavior/Discharge Planning: Goal: Ability to manage health-related needs will improve Outcome: Progressing   Problem: Clinical Measurements: Goal: Ability to maintain clinical measurements within normal limits will improve Outcome: Progressing Goal: Diagnostic test results will improve Outcome: Progressing   Problem: Activity: Goal: Risk for activity intolerance will decrease Outcome: Progressing   Problem: Pain Managment: Goal: General experience of comfort will improve Outcome: Progressing   Problem: Safety: Goal: Ability to remain free from injury will improve Outcome: Progressing   Problem: Skin Integrity: Goal: Risk for impaired skin integrity will decrease Outcome: Progressing   

## 2019-06-25 NOTE — Op Note (Signed)
Patient name: Taylor [name] MRN: [id number] DOB: 1981/05/21 Sex: female  06/25/2019 Pre-operative Diagnosis: Left leg ulcer Post-operative diagnosis:  Same Surgeon:  [name] Procedure Performed:  1.  Ultrasound-guided access, right femoral artery  2.  Abdominal aortogram  3.  Left lower extremity runoff  4.  Atherectomy, left superficial femoral and popliteal artery  5.  Drug-coated balloon angioplasty, left superficial femoral and popliteal artery  6.  Closure device, Mynx  7.  Conscious sedation, 98 minutes   Indications: The patient has osteomyelitis to the left fifth toe and ulcer.  She is here for further evaluation  Procedure:  The patient was identified in the holding area and taken to room 8.  The patient was then placed supine on the table and prepped and draped in the usual sterile fashion.  A time out was called.  Conscious sedation was administered with the use of IV fentanyl and Versed under continuous physician and nurse monitoring.  Heart rate, blood pressure, and oxygen saturation were continuously monitored.  Total sedation time was 72minutes.  Ultrasound was used to evaluate the right common femoral artery.  It was patent .  A digital ultrasound image was acquired.  A micropuncture needle was used to access the right common femoral artery under ultrasound guidance.  An 018 wire was advanced without resistance and a micropuncture sheath was placed.  The 018 wire was removed and a benson wire was placed.  The micropuncture sheath was exchanged for a 5 french sheath.  An omniflush catheter was advanced over the wire to the level of L-1.  An abdominal angiogram was obtained.  Next, using the omniflush catheter and a benson wire, the aortic bifurcation was crossed and the catheter was placed into theleft external iliac artery and left runoff was obtained.    Findings:   Aortogram: No significant renal artery stenosis.  The infrarenal abdominal aorta is widely patent without  stenosis.  Bilateral common and external iliac arteries are widely patent.  Right Lower Extremity: Not evaluated due to contrast utilization  Left Lower Extremity: The left common femoral arteries widely patent.  There are 2 main profunda branches.  1 of which has a 90% stenosis.  The superficial femoral artery is patent however there is a 90% stenosis in its midportion.  The popliteal artery has a short segment occlusion with reconstitution of the popliteal artery at the level patella.  The below-knee popliteal arteries widely patent with three-vessel runoff.  There does appear to be digital artery disease.  Intervention: After the above images were acquired the decision made to proceed with intervention.  Over a 035 wire, a 7 French 45 cm Terumo sheath was advanced in the left external iliac artery.  Patient was fully heparinized.  Next using a 018 quick cross catheter and a V-18 wire, the lesion was successfully crossed.  A contrast injection was performed through the sheath which confirmed successful crossing of the lesion.  A 018 bare wire was then placed.  A large NAV-6 filter was then deployed in the below-knee popliteal artery.  I then used the jetstream atherectomy device and perform atherectomy of the superficial femoral and popliteal artery making 1 pass forward in 1 pass backwards.  Next, I used overlapping 4 mm Ranger balloons (6 x 200, 6 x 100) and performed balloon angioplasty of the treated segment for 3-minute inflations at nominal pressure.  Completion imaging revealed complete resolution of the stenosis with no change in runoff.  The filter was then  retrieved and removed.  The long sheath was exchanged out for short 7 and a minx was used for closure.  Impression:  #1  90% mid SFA lesion and occluded short segment popliteal artery successfully crossed and treated with jetstream atherectomy and drug-coated balloon angioplasty using a 4 mm balloon.  #2  90% profunda origin stenosis   V.  [name], M.D., [facility] [place] Office: [phone] Pager:  [phone]

## 2019-06-25 NOTE — Interval H&P Note (Signed)
History and Physical Interval Note:  06/25/2019 9:18 AM  Taylor [name]  has presented today for surgery, with the diagnosis of ischemia.  The various methods of treatment have been discussed with the patient and family. After consideration of risks, benefits and other options for treatment, the patient has consented to  Procedure(s): ABDOMINAL AORTOGRAM W/LOWER EXTREMITY (N/A) as a surgical intervention.  The patient's history has been reviewed, patient examined, no change in status, stable for surgery.  I have reviewed the patient's chart and labs.  Questions were answered to the patient's satisfaction.     [name]

## 2019-06-25 NOTE — Progress Notes (Signed)
Inpatient Diabetes Program Recommendations  AACE/ADA: New Consensus Statement on Inpatient Glycemic Control (2015)  Target Ranges:  Prepandial:   less than 140 mg/dL      Peak postprandial:   less than 180 mg/dL (1-2 hours)      Critically ill patients:  140 - 180 mg/dL   Lab Results  Component Value Date   GLUCAP 174 (H) 06/25/2019   HGBA1C 10.0 (H) 06/23/2019    Review of Glycemic Control Results for Taylor [name], Taylor [name] (MRN [id number]) as of 06/25/2019 10:51  Ref. Range 06/24/2019 11:40 06/24/2019 17:40 06/24/2019 21:34 06/25/2019 07:59  Glucose-Capillary Latest Ref Range: 70 - 99 mg/dL 205 (H) 183 (H) 168 (H) 174 (H)   Diabetes history: Type 2 DM Outpatient Diabetes medications: Farxiga 10 mg QD, Metformin 500 mg BID Current orders for Inpatient glycemic control: Levemir 30 units QHS, Novolog 0-15 units TID, Novolog 0-5 units QHS, Invokana 100 mg QQAM Solumedrol 125 mg x1  Inpatient Diabetes Program Recommendations:    Anticipate glucose trends to increase given steroids. Consider increasing correction to Novolog 0-20 units TID.  Thanks, Bronson Curb, MSN, RNC-OB Diabetes Coordinator [phone] (8a-5p)

## 2019-06-25 NOTE — Progress Notes (Signed)
Patient received from cath holding at 1830. Post cath IVF ordered at 1200 not started.  Only peripheral IV access (left AC) occluded. Night RN will start IVF once access is reestablished.

## 2019-06-26 ENCOUNTER — Inpatient Hospital Stay: Payer: Self-pay

## 2019-06-26 ENCOUNTER — Inpatient Hospital Stay (HOSPITAL_COMMUNITY): Payer: Medicaid Other

## 2019-06-26 DIAGNOSIS — M869 Osteomyelitis, unspecified: Secondary | ICD-10-CM

## 2019-06-26 DIAGNOSIS — Z9862 Peripheral vascular angioplasty status: Secondary | ICD-10-CM

## 2019-06-26 DIAGNOSIS — I69354 Hemiplegia and hemiparesis following cerebral infarction affecting left non-dominant side: Secondary | ICD-10-CM

## 2019-06-26 DIAGNOSIS — Z888 Allergy status to other drugs, medicaments and biological substances status: Secondary | ICD-10-CM

## 2019-06-26 DIAGNOSIS — B9561 Methicillin susceptible Staphylococcus aureus infection as the cause of diseases classified elsewhere: Secondary | ICD-10-CM

## 2019-06-26 DIAGNOSIS — L02612 Cutaneous abscess of left foot: Secondary | ICD-10-CM

## 2019-06-26 DIAGNOSIS — B951 Streptococcus, group B, as the cause of diseases classified elsewhere: Secondary | ICD-10-CM

## 2019-06-26 DIAGNOSIS — E1151 Type 2 diabetes mellitus with diabetic peripheral angiopathy without gangrene: Secondary | ICD-10-CM

## 2019-06-26 DIAGNOSIS — E1169 Type 2 diabetes mellitus with other specified complication: Secondary | ICD-10-CM

## 2019-06-26 DIAGNOSIS — Z885 Allergy status to narcotic agent status: Secondary | ICD-10-CM

## 2019-06-26 DIAGNOSIS — I10 Essential (primary) hypertension: Secondary | ICD-10-CM

## 2019-06-26 DIAGNOSIS — Z87891 Personal history of nicotine dependence: Secondary | ICD-10-CM

## 2019-06-26 DIAGNOSIS — F319 Bipolar disorder, unspecified: Secondary | ICD-10-CM

## 2019-06-26 DIAGNOSIS — Z91041 Radiographic dye allergy status: Secondary | ICD-10-CM

## 2019-06-26 DIAGNOSIS — I69328 Other speech and language deficits following cerebral infarction: Secondary | ICD-10-CM

## 2019-06-26 LAB — CBC WITH DIFFERENTIAL/PLATELET
Abs Immature Granulocytes: 0.05 K/uL (ref 0.00–0.07)
Basophils Absolute: 0 K/uL (ref 0.0–0.1)
Basophils Relative: 0 %
Eosinophils Absolute: 0 K/uL (ref 0.0–0.5)
Eosinophils Relative: 0 %
HCT: 26.8 % — ABNORMAL LOW (ref 36.0–46.0)
Hemoglobin: 7.8 g/dL — ABNORMAL LOW (ref 12.0–15.0)
Immature Granulocytes: 1 %
Lymphocytes Relative: 16 %
Lymphs Abs: 1.6 K/uL (ref 0.7–4.0)
MCH: 22.6 pg — ABNORMAL LOW (ref 26.0–34.0)
MCHC: 29.1 g/dL — ABNORMAL LOW (ref 30.0–36.0)
MCV: 77.7 fL — ABNORMAL LOW (ref 80.0–100.0)
Monocytes Absolute: 0.4 K/uL (ref 0.1–1.0)
Monocytes Relative: 4 %
Neutro Abs: 8.1 K/uL — ABNORMAL HIGH (ref 1.7–7.7)
Neutrophils Relative %: 79 %
Platelets: 376 K/uL (ref 150–400)
RBC: 3.45 MIL/uL — ABNORMAL LOW (ref 3.87–5.11)
RDW: 15.9 % — ABNORMAL HIGH (ref 11.5–15.5)
WBC: 10.3 K/uL (ref 4.0–10.5)
nRBC: 0 % (ref 0.0–0.2)

## 2019-06-26 LAB — GLUCOSE, CAPILLARY
Glucose-Capillary: 253 mg/dL — ABNORMAL HIGH (ref 70–99)
Glucose-Capillary: 298 mg/dL — ABNORMAL HIGH (ref 70–99)
Glucose-Capillary: 232 mg/dL — ABNORMAL HIGH (ref 70–99)
Glucose-Capillary: 147 mg/dL — ABNORMAL HIGH (ref 70–99)

## 2019-06-26 LAB — COMPREHENSIVE METABOLIC PANEL
ALT: 14 U/L (ref 0–44)
AST: 14 U/L — ABNORMAL LOW (ref 15–41)
Albumin: 2.2 g/dL — ABNORMAL LOW (ref 3.5–5.0)
Alkaline Phosphatase: 73 U/L (ref 38–126)
Anion gap: 8 (ref 5–15)
BUN: 13 mg/dL (ref 6–20)
CO2: 28 mmol/L (ref 22–32)
Calcium: 8.2 mg/dL — ABNORMAL LOW (ref 8.9–10.3)
Chloride: 101 mmol/L (ref 98–111)
Creatinine, Ser: 0.82 mg/dL (ref 0.44–1.00)
GFR calc Af Amer: >60 mL/min (ref >60)
GFR calc non Af Amer: >60 mL/min (ref >60)
Glucose, Bld: 368 mg/dL — ABNORMAL HIGH (ref 70–99)
Potassium: 3.8 mmol/L (ref 3.5–5.1)
Sodium: 137 mmol/L (ref 135–145)
Total Bilirubin: <0.1 mg/dL — ABNORMAL LOW (ref 0.3–1.2)
Total Protein: 5.2 g/dL — ABNORMAL LOW (ref 6.5–8.1)

## 2019-06-26 LAB — POCT ACTIVATED CLOTTING TIME: Activated Clotting Time: 235 seconds

## 2019-06-26 MED ORDER — GADOBUTROL 1 MMOL/ML IV SOLN
8.0000 mL | Freq: Once | INTRAVENOUS | Status: AC | PRN
  Administered 2019-06-26: 8 mL via INTRAVENOUS

## 2019-06-26 MED ORDER — METRONIDAZOLE 500 MG PO TABS
500.0000 mg | ORAL_TABLET | Freq: Three times a day (TID) | ORAL | Status: DC
  Administered 2019-06-26 – 2019-07-05 (×28): 500 mg via ORAL
  Filled 2019-06-26 (×28): qty 1

## 2019-06-26 NOTE — Consult Note (Signed)
[facility]    Date of Admission:  06/23/2019     Total days of antibiotics 4 Day 3 of Cefazolin               Reason for Consult: Osteomyelitis   Referring Provider: Reesa Chew  Primary Care Provider: Monico Blitz, MD   ASSESSMENT:  Taylor [name] is a 39 y/o female with poorly controlled Type 2 diabetes complicated by peripheral artery disease and left fifth toe osteomyelitis s/p aortogram with intervention and debridement of abscess. There was abscess and sinus tract down to the bone with osteomyelitis of the head of the 5th metatarsal on MRI. Cultures positive for Group B streptococcus and MSSA. Revascularization should allow for improved chances of healing, however she remains at high risk for infection and delayed healing secondary to her poorly controlled diabetes with most recent A1c of 10. Continue current dose of cefazolin and will add metronidazole. She will need at least 6 weeks of IV therapy to optimize chances of healing this infection without additional need for surgery.   PLAN:  1. Continue cefazolin. 2. Add metronidazole. 3. Diabetes management per primary team. 4. Place PICC line.   5. Continue wound care per vascular surgery.    Principal Problem:   Osteomyelitis of left foot ([place]) Active Problems:   Diabetic foot infection ([place])   PAD (peripheral artery disease) ([place])   . aspirin EC  81 mg Oral Daily  . atorvastatin  40 mg Oral q1800  . canagliflozin  100 mg Oral QAC breakfast  . clopidogrel  75 mg Oral Q breakfast  . diazepam  5 mg Oral BID  . DULoxetine  30 mg Oral Daily  . enoxaparin (LOVENOX) injection  40 mg Subcutaneous Daily  . gabapentin  900 mg Oral TID  . insulin aspart  0-15 Units Subcutaneous TID WC  . insulin aspart  0-5 Units Subcutaneous QHS  . insulin detemir  30 Units Subcutaneous QHS  . levETIRAcetam  500 mg Oral BID  . lisinopril  20 mg Oral Daily  . metroNIDAZOLE  500 mg Oral Q8H  . multivitamin with  minerals  1 tablet Oral Daily  . nutrition supplement (JUVEN)  1 packet Oral BID BM  . nystatin cream   Topical BID  . pantoprazole  40 mg Oral Daily  . polyethylene glycol  17 g Oral Daily  . potassium chloride  40 mEq Oral BID WC  . Ensure Max Protein  11 oz Oral QHS  . risperiDONE  0.5 mg Oral QHS  . sodium chloride flush  3 mL Intravenous Q12H  . traZODone  200 mg Oral QHS     HPI: Taylor [name] is a 39 y.o. 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  Taylor [name] was previously hospitalized at [facility] from 06/12/2019 until 06/23/2019 with left foot infection, cellulitis and abscess complicated by left fifth toe osteomyelitis and is status post incision and drainage.  Wound cultures obtained grew Group B strep and MSSA. Initially started on piperacillin-tazobactam and clindamycin. She was transferred for neurovascular consult.   Dr. Trula Slade performed aortagram on 1/12 with findings of 90% mid SFA lesion and occluded short segment popliteal artery successfully crossed and  treated with jetstream arthectomy and drug-coated balloon angioplasty and 90% profunda origin stenosis.  Taylor [name] is has remained afebrile and without significant leukocytosis since admission. She is now on Day 4 of antimicrobial therapy here with Cefazolin. Blood cultures have remained without growth to date. Blood sugars remain poorly controlled with random glucose this morning of 368. Most recent A1c of 10.   Taylor [name] describes obtaining an abrasion to her left foot in  when she had a fall that twisted her shoe off that she sustained in September. She initially cleansed the  wound with hydrogen peroxide and alcohol. Treated by her PCP office with 4 different courses of antibiotics over that time. It progressively worsened resulting in her having chills and low grade temperatures of up to 101 F prior to being admitted to [facility].    Review of Systems: Review of Systems  Constitutional: Negative for chills, fever and weight loss.  Respiratory: Negative for cough, shortness of breath and wheezing.   Cardiovascular: Negative for chest pain and leg swelling.  Gastrointestinal: Negative for abdominal pain, constipation, diarrhea, nausea and vomiting.  Musculoskeletal:       Positive for pain in the left foot  Skin: Negative for rash.     Past Medical History:  Diagnosis Date  . Anxiety   . ARF (acute renal failure) ([place]) 2013  . Bipolar disorder ([place])   . Cerebral artery occlusion with cerebral infarction ([place]) 11/13   due to elevated blood sugar 1700-off insulin 6 months  . Depression   . Diabetes mellitus    off insulin 6 months  . Diverticulitis   . Dyspnea    when walking  . GERD (gastroesophageal reflux disease)   . Headache(784.0)   . Hypertension   . Kidney dialysis status    on dialysis(stroke) off after 4-6 weeks  . Pneumonia 2013, 06/2018  . Respiratory failure ([place]) 11/13   with stroke - in hosp [facility] 12 weeks  . Seizures ([place])    during elvated blood sugsr episode 11/13  . Sepsis ([place])   . Stroke ([place])    11/2018, 12/2018 Weak left side, speech- slurred, Short term memeroy, Gait unsteady   . Vocal cord dysfunction     Social History   Tobacco Use  . Smoking status: Former Smoker    Packs/day: 0.60    Years: 23.00    Pack years: 13.80    Types: Cigarettes    Quit date: 02/05/2019    Years since quitting: 0.3  . Smokeless tobacco: Never Used  Substance Use Topics  . Alcohol use: No    Alcohol/week: 0.0 standard drinks  . Drug use: No    Family History  Problem Relation Age of Onset  . Lung cancer Maternal Grandmother    . Heart attack Maternal Grandfather   . Diabetes Paternal Grandfather     Allergies  Allergen Reactions  . Contrast Media [Iodinated Diagnostic Agents] Other (See Comments)    Went into kidney failure   . Amitriptyline Other (See Comments)    Unresponsive   . Compazine Other (See Comments)    Alert but unable to move  . Morphine And Related Other (See Comments)    Chest pain  . Oxycodone Nausea And Vomiting    Severe n/v -  patient will not take oxycodone or percocet.  . Prochlorperazine Other (See Comments)    Alert but unable to move  . Promethazine Swelling    Lips swell  . Sumatriptan Rash  OBJECTIVE: Blood pressure 110/71, pulse 84, temperature 97.9 F (36.6 C), temperature source Oral, resp. rate 17, height 5\' 4"  (1.626 m), weight 94.1 kg, SpO2 98 %.  Physical Exam Constitutional:      General: She is not in acute distress.    Appearance: She is well-developed. She is obese.     Comments: Lying in bed with head of bed elevated; pleasant.   Cardiovascular:     Rate and Rhythm: Normal rate and regular rhythm.     Heart sounds: Normal heart sounds.  Pulmonary:     Effort: Pulmonary effort is normal.     Breath sounds: Normal breath sounds.  Musculoskeletal:     Comments: Surgical dressing on left foot is clean and dry. Pulses are present.   Skin:    General: Skin is warm and dry.  Neurological:     Mental Status: She is alert and oriented to person, place, and time.  Psychiatric:        Mood and Affect: Mood normal.     Lab Results Lab Results  Component Value Date   WBC 10.3 06/26/2019   HGB 7.8 (L) 06/26/2019   HCT 26.8 (L) 06/26/2019   MCV 77.7 (L) 06/26/2019   PLT 376 06/26/2019    Lab Results  Component Value Date   CREATININE 0.82 06/26/2019   BUN 13 06/26/2019   NA 137 06/26/2019   K 3.8 06/26/2019   CL 101 06/26/2019   CO2 28 06/26/2019    Lab Results  Component Value Date   ALT 14 06/26/2019   AST 14 (L) 06/26/2019   ALKPHOS 73  06/26/2019   BILITOT <0.1 (L) 06/26/2019     Microbiology: Recent Results (from the past 240 hour(s))  Blood Cultures x 2 sites     Status: None (Preliminary result)   Collection Time: 06/23/19  7:01 PM   Specimen: BLOOD LEFT ARM  Result Value Ref Range Status   Specimen Description BLOOD LEFT ARM  Final   Special Requests   Final    BOTTLES DRAWN AEROBIC ONLY Blood Culture results may not be optimal due to an inadequate volume of blood received in culture bottles   Culture   Final    NO GROWTH 3 DAYS Performed at [facility], [place] [address], [place], [place] [address]    Report Status PENDING  Incomplete  Blood Cultures x 2 sites     Status: None (Preliminary result)   Collection Time: 06/23/19  7:11 PM   Specimen: BLOOD RIGHT ARM  Result Value Ref Range Status   Specimen Description BLOOD RIGHT ARM  Final   Special Requests   Final    BOTTLES DRAWN AEROBIC ONLY Blood Culture results may not be optimal due to an inadequate volume of blood received in culture bottles   Culture   Final    NO GROWTH 3 DAYS Performed at [facility], [place] [address], [place], [place] [address]    Report Status PENDING  Incomplete     Terri Piedra, [place] [facility] Pager  06/26/2019  4:32 PM

## 2019-06-26 NOTE — Discharge Instructions (Signed)
° °  [facility] ° °Discharge Instructions ° °Lower Extremity Angiogram; Angioplasty/Stenting ° °Please refer to the following instructions for your post-procedure care. Your surgeon or physician assistant will discuss any changes with you. ° °Activity ° °Avoid lifting more than 8 pounds (1 gallons of milk) for 72 hours (3 days) after your procedure. You may walk as much as you can tolerate. It's OK to drive after 72 hours. ° °Bathing/Showering ° °You may shower the day after your procedure. If you have a bandage, you may remove it at 24- 48 hours. Clean your incision site with mild soap and water. Pat the area dry with a clean towel. ° °Diet ° °Resume your pre-procedure diet. There are no special food restrictions following this procedure. All patients with peripheral vascular disease should follow a low fat/low cholesterol diet. In order to heal from your surgery, it is CRITICAL to get adequate nutrition. Your body requires vitamins, minerals, and protein. Vegetables are the best source of vitamins and minerals. Vegetables also provide the perfect balance of protein. Processed food has little nutritional value, so try to avoid this. ° °Medications ° °Resume taking all of your medications unless your doctor tells you not to. If your incision is causing pain, you may take over-the-counter pain relievers such as acetaminophen (Tylenol) ° °Follow Up ° °Follow up will be arranged at the time of your procedure. You may have an office visit scheduled or may be scheduled for surgery. Ask your surgeon if you have any questions. ° °Please call us immediately for any of the following conditions: °•Severe or worsening pain your legs or feet at rest or with walking. °•Increased pain, redness, drainage at your groin puncture site. °•Fever of 101 degrees or higher. °•If you have any mild or slow bleeding from your puncture site: lie down, apply firm constant pressure over the area with a piece of  gauze or a clean wash cloth for 30 minutes- no peeking!, call 911 right away if you are still bleeding after 30 minutes, or if the bleeding is heavy and unmanageable. ° °Reduce your risk factors of vascular disease: ° °Stop smoking. If you would like help call [facility] at 1-800-QUIT-NOW (1-800-784-8669) or [place] at 336-586-4000. °Manage your cholesterol °Maintain a desired weight °Control your diabetes °Keep your blood pressure down ° °If you have any questions, please call the office at [phone] ° °

## 2019-06-26 NOTE — Progress Notes (Signed)
Inpatient Diabetes Program Recommendations  AACE/ADA: New Consensus Statement on Inpatient Glycemic Control (2015)  Target Ranges:  Prepandial:   less than 140 mg/dL      Peak postprandial:   less than 180 mg/dL (1-2 hours)      Critically ill patients:  140 - 180 mg/dL   Lab Results  Component Value Date   GLUCAP 298 (H) 06/26/2019   HGBA1C 10.0 (H) 06/23/2019    Review of Glycemic Control  Diabetes history: Dm 2 Outpatient Diabetes medications: Farxiga 10 mg Daily, Metformin 500 mg bid Current orders for Inpatient glycemic control:  Levemir 30 units qhs Novolog 0-15 units tid + hs  Inpatient Diabetes Program Recommendations:    Spoke with patient and mother at bedside. Pt reports having short term memory issues. Pts mom helps with medications at home.Pt does not check glucose trends at home. Pt reports being on humalog in the past and mom takes Ozempic via pen at home. Both are agreeable to insulin pen at time of d/c if needed. Pt return demonstrated the insulin pen, mom verified. No needs at this time other than glucose meter and insulin supplies at time of d/c.  glucose meter at time of d/c (order # [id number])  Has been on insulin in the past insulin pen needles (order [id number])   Thanks,  Tama Headings RN, MSN, BC-ADM Inpatient Diabetes Coordinator Team Pager [phone] (8a-5p)

## 2019-06-26 NOTE — Progress Notes (Addendum)
PROGRESS NOTE    Taylor [name]  [id number] DOB: 03/10/1981 DOA: 06/23/2019 PCP: Monico Blitz, MD   Brief Narrative:  Taylor [name] is a 39 y.o. female with medical history significant for type 2 diabetes, previous stroke, seizures, hypertension, and hyperlipidemia.  Patient was hospitalized at [facility] facility from 06/12/2019 to 06/22/2018 for left foot infection with cellulitis and abscess complicated by left fifth toe osteomyelitis, necrotic ulcer.  I&D was performed and culture grew pan MSSA.  She did had abnormal [name] and she was transferred to [facility] for vascular surgery evaluation.  Initially treated with cefepime and vancomycin Antibiotics narrowed to cefazolin based on culture results.  Subjective: Patient was feeling better when seen this morning.  Having some pain in the left foot.  Assessment & Plan:   Active Problems:   Diabetic foot infection ([place])   PAD (peripheral artery disease) (HCC)   Osteomyelitis of left foot (HCC)  Left foot infection and osteomyelitis of left fifth toe and necrosis: Peripheral arterial disease. [name] shows bilateral lower extremity peripheral arterial disease. Vascular surgery was consulted and patient was taken for angiography/angioplasty, drug-coated balloon angioplasty was done on left superficial femoral and popliteal artery with good response. -Antibiotics narrowed to cefazolin based on her IND culture results, could not find any documentations. -PT/OT consult for home health needs.  Type 2 diabetes.  Uncontrolled with A1c of 10. CBG elevated in 200s to 300s. -Increase Lantus to 35 units at bedtime. -Continue moderate SSI.  History of CVA.  No new neurologic deficit. S/P endovascular revascularization of left vertebral artery stenosis. -Continue aspirin and statin. -Continue Plavix  Seizure disorder.  No recent seizure-like activity. -Continue Keppra  Bipolar with depression.  -Continue home dose of Cymbalta. -Continue  risperidone.  Hypertension.  Currently normotensive. -Continue lisinopril.  Objective: Vitals:   06/25/19 2100 06/26/19 0300 06/26/19 0624 06/26/19 0625  BP: 122/76  127/72 127/72  Pulse: 96 86 77 79  Resp: 16 17 15 19   Temp:   97.9 F (36.6 C)   TempSrc:   Oral   SpO2: 98% 93% 98% 97%  Weight:   94.1 kg   Height:        Intake/Output Summary (Last 24 hours) at 06/26/2019 1453 Last data filed at 06/26/2019 0626 Gross per 24 hour  Intake 1284.54 ml  Output 1200 ml  Net 84.54 ml   Filed Weights   06/23/19 1900 06/26/19 0624  Weight: 93.1 kg 94.1 kg    Examination:  General exam: Appears calm and comfortable  Respiratory system: Clear to auscultation. Respiratory effort normal. Cardiovascular system: S1 & S2 heard, RRR. No JVD, murmurs, rubs, gallops or clicks. Gastrointestinal system: Soft, nontender, nondistended, bowel sounds positive. Central nervous system: Alert and oriented. No focal neurological deficits.Symmetric 5 x 5 power. Extremities: Left foot with bandage which show some serosanguineous discharge.  Pulses diminished bilaterally. Skin: No rashes, lesions or ulcers Psychiatry: Judgement and insight appear normal. Mood & affect appropriate.    DVT prophylaxis: Lovenox Code Status: Full Family Communication: No family at bedside Disposition Plan: Pending improvement.  Consultants:   Vascular surgery  Procedures:   Antimicrobials:  Cefazolin  Data Reviewed: I have personally reviewed following labs and imaging studies  CBC: Recent Labs  Lab 06/23/19 1911 06/24/19 0113 06/25/19 0214 06/26/19 0957  WBC 9.5 11.1* 9.2 10.3  NEUTROABS 7.1  --   --  8.1*  HGB 8.7* 9.1* 8.6* 7.8*  HCT 30.3* 32.0* 30.1* 26.8*  MCV 78.3* 79.2* 78.0* 77.7*  PLT 388 390 353 [id number]   Basic Metabolic Panel: Recent Labs  Lab 06/23/19 1911 06/24/19 0113 06/25/19 0214 06/26/19 0957  NA 139 139 140 137  K 3.2* 3.4* 3.9 3.8  CL 100 98 103 101  CO2 27 29 27 28     GLUCOSE 267* 310* 231* 368*  BUN 11 9 10 13   CREATININE 0.75 0.77 0.74 0.82  CALCIUM 8.5* 8.4* 8.6* 8.2*  MG  --  1.4* 2.2  --    GFR: Estimated Creatinine Clearance: 102.5 mL/min (by C-G formula based on SCr of 0.82 mg/dL). Liver Function Tests: Recent Labs  Lab 06/26/19 0957  AST 14*  ALT 14  ALKPHOS 73  BILITOT <0.1*  PROT 5.2*  ALBUMIN 2.2*   No results for input(s): LIPASE, AMYLASE in the last 168 hours. No results for input(s): AMMONIA in the last 168 hours. Coagulation Profile: Recent Labs  Lab 06/24/19 0113  INR 1.0   Cardiac Enzymes: No results for input(s): CKTOTAL, CKMB, CKMBINDEX, TROPONINI in the last 168 hours. BNP (last 3 results) No results for input(s): PROBNP in the last 8760 hours. HbA1C: Recent Labs    06/23/19 1911  HGBA1C 10.0*   CBG: Recent Labs  Lab 06/25/19 0759 06/25/19 1842 06/25/19 2057 06/26/19 0730 06/26/19 1211  GLUCAP 174* 344* 299* 253* 298*   Lipid Profile: No results for input(s): CHOL, HDL, LDLCALC, TRIG, CHOLHDL, LDLDIRECT in the last 72 hours. Thyroid Function Tests: No results for input(s): TSH, T4TOTAL, FREET4, T3FREE, THYROIDAB in the last 72 hours. Anemia Panel: No results for input(s): VITAMINB12, FOLATE, FERRITIN, TIBC, IRON, RETICCTPCT in the last 72 hours. Sepsis Labs: No results for input(s): PROCALCITON, LATICACIDVEN in the last 168 hours.  Recent Results (from the past 240 hour(s))  Blood Cultures x 2 sites     Status: None (Preliminary result)   Collection Time: 06/23/19  7:01 PM   Specimen: BLOOD LEFT ARM  Result Value Ref Range Status   Specimen Description BLOOD LEFT ARM  Final   Special Requests   Final    BOTTLES DRAWN AEROBIC ONLY Blood Culture results may not be optimal due to an inadequate volume of blood received in culture bottles   Culture   Final    NO GROWTH 3 DAYS Performed at [facility], [place] [address] [place], [place] [address]    Report Status PENDING  Incomplete  Blood  Cultures x 2 sites     Status: None (Preliminary result)   Collection Time: 06/23/19  7:11 PM   Specimen: BLOOD RIGHT ARM  Result Value Ref Range Status   Specimen Description BLOOD RIGHT ARM  Final   Special Requests   Final    BOTTLES DRAWN AEROBIC ONLY Blood Culture results may not be optimal due to an inadequate volume of blood received in culture bottles   Culture   Final    NO GROWTH 3 DAYS Performed at [facility], [place] [address], [place], [place] [address]    Report Status PENDING  Incomplete     Radiology Studies: PERIPHERAL VASCULAR CATHETERIZATION  Result Date: 06/25/2019 Patient name: Taylor [name] MRN: [id number] DOB: 10/28/1980 Sex: female 06/25/2019 Pre-operative Diagnosis: Left leg ulcer Post-operative diagnosis:  Same Surgeon:  [name] Procedure Performed:  1.  Ultrasound-guided access, right femoral artery  2.  Abdominal aortogram  3.  Left lower extremity runoff  4.  Atherectomy, left superficial femoral and popliteal artery  5.  Drug-coated balloon angioplasty, left superficial femoral and popliteal  artery  6.  Closure device, Mynx  7.  Conscious sedation, 98 minutes Indications: The patient has osteomyelitis to the left fifth toe and ulcer.  She is here for further evaluation Procedure:  The patient was identified in the holding area and taken to room 8.  The patient was then placed supine on the table and prepped and draped in the usual sterile fashion.  A time out was called.  Conscious sedation was administered with the use of IV fentanyl and Versed under continuous physician and nurse monitoring.  Heart rate, blood pressure, and oxygen saturation were continuously monitored.  Total sedation time was 63minutes.  Ultrasound was used to evaluate the right common femoral artery.  It was patent .  A digital ultrasound image was acquired.  A micropuncture needle was used to access the right common femoral artery under ultrasound guidance.  An 018 wire was advanced without  resistance and a micropuncture sheath was placed.  The 018 wire was removed and a benson wire was placed.  The micropuncture sheath was exchanged for a 5 french sheath.  An omniflush catheter was advanced over the wire to the level of L-1.  An abdominal angiogram was obtained.  Next, using the omniflush catheter and a benson wire, the aortic bifurcation was crossed and the catheter was placed into theleft external iliac artery and left runoff was obtained.  Findings:  Aortogram: No significant renal artery stenosis.  The infrarenal abdominal aorta is widely patent without stenosis.  Bilateral common and external iliac arteries are widely patent.  Right Lower Extremity: Not evaluated due to contrast utilization  Left Lower Extremity: The left common femoral arteries widely patent.  There are 2 main profunda branches.  1 of which has a 90% stenosis.  The superficial femoral artery is patent however there is a 90% stenosis in its midportion.  The popliteal artery has a short segment occlusion with reconstitution of the popliteal artery at the level patella.  The below-knee popliteal arteries widely patent with three-vessel runoff.  There does appear to be digital artery disease. Intervention: After the above images were acquired the decision made to proceed with intervention.  Over a 035 wire, a 7 French 45 cm Terumo sheath was advanced in the left external iliac artery.  Patient was fully heparinized.  Next using a 018 quick cross catheter and a V-18 wire, the lesion was successfully crossed.  A contrast injection was performed through the sheath which confirmed successful crossing of the lesion.  A 018 bare wire was then placed.  A large NAV-6 filter was then deployed in the below-knee popliteal artery.  I then used the jetstream atherectomy device and perform atherectomy of the superficial femoral and popliteal artery making 1 pass forward in 1 pass backwards.  Next, I used overlapping 4 mm Ranger balloons (6 x 200,  6 x 100) and performed balloon angioplasty of the treated segment for 3-minute inflations at nominal pressure.  Completion imaging revealed complete resolution of the stenosis with no change in runoff.  The filter was then retrieved and removed.  The long sheath was exchanged out for short 7 and a minx was used for closure. Impression:  #1  90% mid SFA lesion and occluded short segment popliteal artery successfully crossed and treated with jetstream atherectomy and drug-coated balloon angioplasty using a 4 mm balloon.  #2  90% profunda origin stenosis V. [name], M.D., [facility] [place] Office: [phone] Pager:  [phone]   Scheduled Meds: .  aspirin EC  81 mg Oral Daily  . atorvastatin  40 mg Oral q1800  . canagliflozin  100 mg Oral QAC breakfast  . clopidogrel  75 mg Oral Q breakfast  . diazepam  5 mg Oral BID  . DULoxetine  30 mg Oral Daily  . enoxaparin (LOVENOX) injection  40 mg Subcutaneous Daily  . gabapentin  900 mg Oral TID  . insulin aspart  0-15 Units Subcutaneous TID WC  . insulin aspart  0-5 Units Subcutaneous QHS  . insulin detemir  30 Units Subcutaneous QHS  . levETIRAcetam  500 mg Oral BID  . lisinopril  20 mg Oral Daily  . multivitamin with minerals  1 tablet Oral Daily  . nutrition supplement (JUVEN)  1 packet Oral BID BM  . nystatin cream   Topical BID  . pantoprazole  40 mg Oral Daily  . polyethylene glycol  17 g Oral Daily  . potassium chloride  40 mEq Oral BID WC  . Ensure Max Protein  11 oz Oral QHS  . risperiDONE  0.5 mg Oral QHS  . sodium chloride flush  3 mL Intravenous Q12H  . traZODone  200 mg Oral QHS   Continuous Infusions: . sodium chloride 100 mL/hr at 06/26/19 0843  . sodium chloride    .  ceFAZolin (ANCEF) IV 2 g (06/26/19 1330)     LOS: 3 days   Time spent: 30 minutes.  Lorella Nimrod, MD [facility] Pager [phone]  If 7PM-7AM, please contact night-coverage [email or web address] Password  [facility] 06/26/2019, 2:53 PM   This record has been created using Dragon voice recognition software. Errors have been sought and corrected,but may not always be located. Such creation errors do not reflect on the standard of care.

## 2019-06-26 NOTE — Progress Notes (Addendum)
Progress Note    06/26/2019 11:34 AM 1 Day Post-Op  Subjective:  Wants nystatin cream for her groin  afebrile  Vitals:   06/26/19 0624 06/26/19 0625  BP: 127/72 127/72  Pulse: 77 79  Resp: 15 19  Temp: 97.9 F (36.6 C)   SpO2: 98% 97%    Physical Exam: Cardiac:  regular Lungs:  Non labored Incisions:  Right groin is soft without hematoma; yeast present Extremities:  Brisk left PT/DP doppler signals   CBC    Component Value Date/Time   WBC 10.3 06/26/2019 0957   RBC 3.45 (L) 06/26/2019 0957   HGB 7.8 (L) 06/26/2019 0957   HCT 26.8 (L) 06/26/2019 0957   PLT 376 06/26/2019 0957   MCV 77.7 (L) 06/26/2019 0957   MCH 22.6 (L) 06/26/2019 0957   MCHC 29.1 (L) 06/26/2019 0957   RDW 15.9 (H) 06/26/2019 0957   LYMPHSABS 1.6 06/26/2019 0957   MONOABS 0.4 06/26/2019 0957   EOSABS 0.0 06/26/2019 0957   BASOSABS 0.0 06/26/2019 0957    BMET    Component Value Date/Time   NA 137 06/26/2019 0957   K 3.8 06/26/2019 0957   CL 101 06/26/2019 0957   CO2 28 06/26/2019 0957   GLUCOSE 368 (H) 06/26/2019 0957   BUN 13 06/26/2019 0957   CREATININE 0.82 06/26/2019 0957   CALCIUM 8.2 (L) 06/26/2019 0957   GFRNONAA >60 06/26/2019 0957   GFRAA >60 06/26/2019 0957    INR    Component Value Date/Time   INR 1.0 06/24/2019 0113     Intake/Output Summary (Last 24 hours) at 06/26/2019 1134 Last data filed at 06/26/2019 [id number] Gross per 24 hour  Intake 1284.54 ml  Output 1200 ml  Net 84.54 ml     Assessment:  39 y.o. female is s/p:  Procedure Performed:             1.  Ultrasound-guided access, right femoral artery             2.  Abdominal aortogram             3.  Left lower extremity runoff             4.  Atherectomy, left superficial femoral and popliteal artery             5.  Drug-coated balloon angioplasty, left superficial femoral and popliteal artery             6.  Closure device, Mynx             7.  Conscious sedation, 98 minutes  1 Day  Post-Op   Plan: -pt with brisk doppler signals left foot in the PT/AT -continue Plavix -pt with yeast right groin-she is requesting I order nystatin cream, but this is already ordered.   -right groin soft without hematoma -pt to f/u in 4-6 weeks with Dr. Trula Slade with LLE arterial duplex and [name]'s     Leontine Locket, PA-C [facility] [phone] 06/26/2019 11:34 AM  I agree with the above.  I have seen and evaluated the patient.  She has a brisk, triphasic posterior tibial Doppler signal now.  I am going to order MRI to look for deep space infection as she still has erythema on the top of her foot and possible osteomyelitis of her fifth metatarsal head.  If there is no residual purulence, I would like for her foot to demarcate.  If she continues to have a deep space infection, she  may require additional surgical intervention..  Please continue aspirin Plavix and statin.  [name]

## 2019-06-27 DIAGNOSIS — M86172 Other acute osteomyelitis, left ankle and foot: Secondary | ICD-10-CM

## 2019-06-27 DIAGNOSIS — Z95828 Presence of other vascular implants and grafts: Secondary | ICD-10-CM

## 2019-06-27 LAB — GLUCOSE, CAPILLARY
Glucose-Capillary: 156 mg/dL — ABNORMAL HIGH (ref 70–99)
Glucose-Capillary: 148 mg/dL — ABNORMAL HIGH (ref 70–99)
Glucose-Capillary: 177 mg/dL — ABNORMAL HIGH (ref 70–99)
Glucose-Capillary: 233 mg/dL — ABNORMAL HIGH (ref 70–99)

## 2019-06-27 MED ORDER — CHLORHEXIDINE GLUCONATE CLOTH 2 % EX PADS
6.0000 | MEDICATED_PAD | Freq: Every day | CUTANEOUS | Status: DC
  Administered 2019-06-27 – 2019-07-02 (×6): 6 via TOPICAL

## 2019-06-27 MED ORDER — SODIUM CHLORIDE 0.9% FLUSH: 10.0000 mL | INTRAVENOUS | Status: DC | PRN

## 2019-06-27 MED ORDER — HYDROCODONE-ACETAMINOPHEN 5-325 MG PO TABS
2.0000 | ORAL_TABLET | ORAL | Status: DC | PRN
  Administered 2019-06-27 – 2019-07-04 (×32): 2 via ORAL
  Filled 2019-06-27 (×32): qty 2

## 2019-06-27 MED ORDER — SODIUM CHLORIDE 0.9% FLUSH
10.0000 mL | Freq: Two times a day (BID) | INTRAVENOUS | Status: DC
  Administered 2019-06-27 – 2019-06-29 (×5): 10 mL
  Administered 2019-06-30: 20 mL
  Administered 2019-06-30 – 2019-07-04 (×5): 10 mL

## 2019-06-27 MED ORDER — HYDROCODONE-ACETAMINOPHEN 5-325 MG PO TABS: 2.0000 | ORAL_TABLET | Freq: Four times a day (QID) | ORAL | Status: DC | PRN

## 2019-06-27 NOTE — Progress Notes (Signed)
[facility] [place]  Subjective  - Left foot hurts some.   Objective 138/89 98 97.7 F (36.5 C) (Oral) 17 97%  Intake/Output Summary (Last 24 hours) at 06/27/2019 0815 Last data filed at 06/27/2019 [id number] Gross per 24 hour  Intake 1080 ml  Output 900 ml  Net 180 ml   Right and Left DP/PT signals via doppler Left dorsum and lateral 4th and 5th toe gangrene  Lungs non labored breathing Right groin soft  MRI 06/25/18 left foot IMPRESSION: 1.  Limited examination due to patient motion. 2. Findings again suggestive of acute osteomyelitis involving the fifth metatarsal head with area of ulceration overlying the metatarsal head extending to the osseous surface. Non loculated fluid seen within this region likely phlegmon/sinus tract. 3. Reactive marrow seen throughout the remainder of the fifth metatarsal head. 4. Diffuse dorsal subcutaneous edema and cellulitis. 5. New superficial area of ulceration on the dorsum of the foot overlying the third metatarsal. 6. Diffuse signal changes throughout the muscles, likely due to denervation changes versus myositis.    Assessment/Planning: POD # 2  Procedure Performed: 1. Ultrasound-guided access, right femoral artery 2. Abdominal aortogram 3. Left lower extremity runoff 4. Atherectomy, left superficial femoral and popliteal artery 5. Drug-coated balloon angioplasty, left superficial femoral and popliteal artery 6. Closure device, Mynx 7. Conscious sedation, 98 minutes   Plavix daily.  Received PICC line for IV antibiotics Patent arterial flow left LE Mild cellulitis with gangrene left foot.  MRI suggesting acute osteomyelitis. She may need 5th toe and metatarsal head amputation this visit.  Dr. Trula Slade will review the MRI.  Roxy Horseman 06/27/2019 8:15 AM --  Laboratory Lab Results: Recent Labs   06/25/19 0214 06/26/19 0957  WBC 9.2 10.3  HGB 8.6* 7.8*  HCT 30.1* 26.8*  PLT 353 376   BMET Recent Labs    06/25/19 0214 06/26/19 0957  NA 140 137  K 3.9 3.8  CL 103 101  CO2 27 28  GLUCOSE 231* 368*  BUN 10 13  CREATININE 0.74 0.82  CALCIUM 8.6* 8.2*    COAG Lab Results  Component Value Date   INR 1.0 06/24/2019   INR 1.1 03/25/2019   INR 1.0 02/20/2019   No results found for: PTT

## 2019-06-27 NOTE — Progress Notes (Signed)
Peripherally Inserted Central Catheter/Midline Placement  The IV Nurse has discussed with the patient and/or persons authorized to consent for the patient, the purpose of this procedure and the potential benefits and risks involved with this procedure.  The benefits include less needle sticks, lab draws from the catheter, and the patient may be discharged home with the catheter. Risks include, but not limited to, infection, bleeding, blood clot (thrombus formation), and puncture of an artery; nerve damage and irregular heartbeat and possibility to perform a PICC exchange if needed/ordered by physician.  Alternatives to this procedure were also discussed.  Bard Power PICC patient education guide, fact sheet on infection prevention and patient information card has been provided to patient /or left at bedside.    PICC/Midline Placement Documentation  PICC Single Lumen [id number] PICC Left Basilic 41 cm 0 cm (Active)  Indication for Insertion or Continuance of Line Home intravenous therapies (PICC only) 06/27/19 0800  Exposed Catheter (cm) 0 cm 06/27/19 0800  Site Assessment Clean;Dry;Intact 06/27/19 0800  Line Status Flushed;Blood return noted 06/27/19 0800  Dressing Type Transparent 06/27/19 0800  Dressing Status Clean;Dry;Intact;Antimicrobial disc in place;Other (Comment) 06/27/19 0800  Dressing Intervention New dressing 06/27/19 0800  Dressing Change Due 07/04/19 06/27/19 0800   Telephone consent signed by mother    Synthia Innocent 06/27/2019, 8:01 AM

## 2019-06-27 NOTE — Progress Notes (Signed)
[facility] for Infectious Disease  Date of Admission:  06/23/2019     Total days of antibiotics 5 Cefazolin Day 4         ASSESSMENT:  MRI overnight suggestive of acute osteomyelitis of the fifth metatarsal head with non-loculated fluid likely representing phlegmon/sinus tract. Vascular Surgery may be consider amputation which would change initial plan of 6 weeks of antibiotics with Cefazolin and metronidazole. Will update plan of care pending Vascular Surgery decision. For now will continue Cefazolin and Metronidazole.   PLAN:  1. Continue Cefazolin and metronidazole.  2. Await Vascular Surgery decision on further surgical intervention.  3. Pain management per primary team.    Principal Problem:   Osteomyelitis of left foot (HCC) Active Problems:   Diabetic foot infection ([place])   PAD (peripheral artery disease) ([place])   . aspirin EC  81 mg Oral Daily  . atorvastatin  40 mg Oral q1800  . canagliflozin  100 mg Oral QAC breakfast  . Chlorhexidine Gluconate Cloth  6 each Topical Daily  . clopidogrel  75 mg Oral Q breakfast  . diazepam  5 mg Oral BID  . DULoxetine  30 mg Oral Daily  . enoxaparin (LOVENOX) injection  40 mg Subcutaneous Daily  . gabapentin  900 mg Oral TID  . insulin aspart  0-15 Units Subcutaneous TID WC  . insulin aspart  0-5 Units Subcutaneous QHS  . insulin detemir  30 Units Subcutaneous QHS  . levETIRAcetam  500 mg Oral BID  . lisinopril  20 mg Oral Daily  . metroNIDAZOLE  500 mg Oral Q8H  . multivitamin with minerals  1 tablet Oral Daily  . nutrition supplement (JUVEN)  1 packet Oral BID BM  . nystatin cream   Topical BID  . pantoprazole  40 mg Oral Daily  . polyethylene glycol  17 g Oral Daily  . potassium chloride  40 mEq Oral BID WC  . Ensure Max Protein  11 oz Oral QHS  . risperiDONE  0.5 mg Oral QHS  . sodium chloride flush  10-40 mL Intracatheter Q12H  . sodium chloride flush  3 mL Intravenous Q12H  . traZODone  200 mg Oral QHS     SUBJECTIVE:  Afebrile overnight with no acute events. MRI of the left foot with findings suggestive of acute osteomyelitis involving the fifth metatarsal head with area of ulceration overlying the metatarsal head extending to the osseus surface with non-loculated fluid likely phelegmon/sinus tract. Vascular surgery may consider amputation. Continues to have increasing pain in her left foot. She is tearful at present and ready to go home when able.   Allergies  Allergen Reactions  . Contrast Media [Iodinated Diagnostic Agents] Other (See Comments)    Went into kidney failure   . Amitriptyline Other (See Comments)    Unresponsive   . Compazine Other (See Comments)    Alert but unable to move  . Morphine And Related Other (See Comments)    Chest pain  . Oxycodone Nausea And Vomiting    Severe n/v -  patient will not take oxycodone or percocet.  . Prochlorperazine Other (See Comments)    Alert but unable to move  . Promethazine Swelling    Lips swell  . Sumatriptan Rash     Review of Systems: Review of Systems  Constitutional: Negative for chills, fever and weight loss.  Respiratory: Negative for cough, shortness of breath and wheezing.   Cardiovascular: Negative for chest pain and leg swelling.  Gastrointestinal: Negative  for abdominal pain, constipation, diarrhea, nausea and vomiting.  Skin: Negative for rash.      OBJECTIVE: Vitals:   06/26/19 1620 06/26/19 2047 06/27/19 0636 06/27/19 1031  BP: 110/71 121/81 138/89 116/76  Pulse: 84 94 98   Resp: 17 20 17    Temp:  98.4 F (36.9 C) 97.7 F (36.5 C)   TempSrc:  Oral Oral   SpO2: 98% 100% 97%   Weight:   96.6 kg   Height:       Body mass index is 36.56 kg/m.  Physical Exam Constitutional:      General: She is not in acute distress.    Appearance: She is well-developed.  Cardiovascular:     Rate and Rhythm: Normal rate and regular rhythm.     Heart sounds: Normal heart sounds.     Comments: PICC line in  left upper arm with dressing that is clean and dry; site without evidence of infection. Pulmonary:     Effort: Pulmonary effort is normal.     Breath sounds: Normal breath sounds.  Musculoskeletal:     Comments: Surgical dressing on left foot appears clean and dry.   Skin:    General: Skin is warm and dry.  Neurological:     Mental Status: She is alert and oriented to person, place, and time.  Psychiatric:     Comments: Tearful at times.      Lab Results Lab Results  Component Value Date   WBC 10.3 06/26/2019   HGB 7.8 (L) 06/26/2019   HCT 26.8 (L) 06/26/2019   MCV 77.7 (L) 06/26/2019   PLT 376 06/26/2019    Lab Results  Component Value Date   CREATININE 0.82 06/26/2019   BUN 13 06/26/2019   NA 137 06/26/2019   K 3.8 06/26/2019   CL 101 06/26/2019   CO2 28 06/26/2019    Lab Results  Component Value Date   ALT 14 06/26/2019   AST 14 (L) 06/26/2019   ALKPHOS 73 06/26/2019   BILITOT <0.1 (L) 06/26/2019     Microbiology: Recent Results (from the past 240 hour(s))  Blood Cultures x 2 sites     Status: None (Preliminary result)   Collection Time: 06/23/19  7:01 PM   Specimen: BLOOD LEFT ARM  Result Value Ref Range Status   Specimen Description BLOOD LEFT ARM  Final   Special Requests   Final    BOTTLES DRAWN AEROBIC ONLY Blood Culture results may not be optimal due to an inadequate volume of blood received in culture bottles   Culture   Final    NO GROWTH 4 DAYS Performed at [facility], [place] [address] [place], [place] [address]    Report Status PENDING  Incomplete  Blood Cultures x 2 sites     Status: None (Preliminary result)   Collection Time: 06/23/19  7:11 PM   Specimen: BLOOD RIGHT ARM  Result Value Ref Range Status   Specimen Description BLOOD RIGHT ARM  Final   Special Requests   Final    BOTTLES DRAWN AEROBIC ONLY Blood Culture results may not be optimal due to an inadequate volume of blood received in culture bottles   Culture   Final    NO  GROWTH 4 DAYS Performed at [facility], [place] [address] [place], [place] [address]    Report Status PENDING  Incomplete     Terri Piedra, [facility] [phone] Pager  06/27/2019  11:38 AM

## 2019-06-27 NOTE — Progress Notes (Signed)
PROGRESS NOTE    Taylor [name]  [id number] DOB: 1980/11/04 DOA: 06/23/2019 PCP: Monico Blitz, MD   Brief Narrative:  Taylor [name] is a 39 y.o. female with medical history significant for type 2 diabetes, previous stroke, seizures, hypertension, and hyperlipidemia.  Patient was hospitalized at [facility] facility from 06/12/2019 to 06/22/2018 for left foot infection with cellulitis and abscess complicated by left fifth toe osteomyelitis, necrotic ulcer.  I&D was performed and culture grew pan MSSA.  She did had abnormal [name] and she was transferred to [facility] for vascular surgery evaluation.  Initially treated with cefepime and vancomycin Antibiotics narrowed to cefazolin based on culture results, Flagyl was added by ID for anaerobic coverage.  Subjective: Patient was complaining of left foot pain during morning rounds.  Asking for more pain meds.  Assessment & Plan:   Principal Problem:   Osteomyelitis of left foot (HCC) Active Problems:   Diabetic foot infection (HCC)   PAD (peripheral artery disease) (HCC)  Left foot infection and osteomyelitis of left fifth toe and necrosis: Peripheral arterial disease. [name] shows bilateral lower extremity peripheral arterial disease. Vascular surgery was consulted and patient was taken for angiography/angioplasty, drug-coated balloon angioplasty was done on left superficial femoral and popliteal artery with good response. -Antibiotics narrowed to cefazolin based on her IND culture results, -Flagyl was added yesterday by ID to cover for anaerobics. -She will need 6 weeks of antibiotics for osteomyelitis-this duration will be changed if vascular surgery take her for toe amputation which is under consideration at this time. -PT/OT consult for home health needs. -Increase Vicodin from 1 tablet to 2 tablets every 6 hourly.  Type 2 diabetes.  Uncontrolled with A1c of 10. CBG improved. -Continue Lantus  35 units at bedtime. -Continue moderate  SSI.  History of CVA.  No new neurologic deficit. S/P endovascular revascularization of left vertebral artery stenosis. -Continue aspirin and statin. -Continue Plavix  Seizure disorder.  No recent seizure-like activity. -Continue Keppra  Bipolar with depression.  -Continue home dose of Cymbalta. -Continue risperidone.  Hypertension.  Currently normotensive. -Continue lisinopril.  Objective: Vitals:   06/26/19 2047 06/27/19 0636 06/27/19 1031 06/27/19 1501  BP: 121/81 138/89 116/76 112/67  Pulse: 94 98  93  Resp: 20 17  18   Temp: 98.4 F (36.9 C) 97.7 F (36.5 C)  (!) 97.4 F (36.3 C)  TempSrc: Oral Oral  Oral  SpO2: 100% 97%  99%  Weight:  96.6 kg    Height:        Intake/Output Summary (Last 24 hours) at 06/27/2019 1550 Last data filed at 06/27/2019 1132 Gross per 24 hour  Intake 1440 ml  Output 900 ml  Net 540 ml   Filed Weights   06/23/19 1900 06/26/19 0624 06/27/19 0636  Weight: 93.1 kg 94.1 kg 96.6 kg    Examination:  General exam: Appears calm and comfortable  Respiratory system: Clear to auscultation. Respiratory effort normal. Cardiovascular system: S1 & S2 heard, RRR. No JVD, murmurs, rubs, gallops or clicks. Gastrointestinal system: Soft, nontender, nondistended, bowel sounds positive. Central nervous system: Alert and oriented. No focal neurological deficits.Symmetric 5 x 5 power. Extremities: Left foot with bandage which show some serosanguineous discharge.  Pulses diminished bilaterally. Skin: No rashes, lesions or ulcers Psychiatry: Judgement and insight appear normal.     DVT prophylaxis: Lovenox Code Status: Full Family Communication: No family at bedside Disposition Plan: Pending improvement.  Consultants:   Vascular surgery  ID  Procedures:  Angiography and PCI of  left femoral and popliteal arteries.  Antimicrobials:  Cefazolin Flagyl  Data Reviewed: I have personally reviewed following labs and imaging studies  CBC: Recent  Labs  Lab 06/23/19 1911 06/24/19 0113 06/25/19 0214 06/26/19 0957  WBC 9.5 11.1* 9.2 10.3  NEUTROABS 7.1  --   --  8.1*  HGB 8.7* 9.1* 8.6* 7.8*  HCT 30.3* 32.0* 30.1* 26.8*  MCV 78.3* 79.2* 78.0* 77.7*  PLT 388 390 353 [id number]   Basic Metabolic Panel: Recent Labs  Lab 06/23/19 1911 06/24/19 0113 06/25/19 0214 06/26/19 0957  NA 139 139 140 137  K 3.2* 3.4* 3.9 3.8  CL 100 98 103 101  CO2 27 29 27 28   GLUCOSE 267* 310* 231* 368*  BUN 11 9 10 13   CREATININE 0.75 0.77 0.74 0.82  CALCIUM 8.5* 8.4* 8.6* 8.2*  MG  --  1.4* 2.2  --    GFR: Estimated Creatinine Clearance: 104 mL/min (by C-G formula based on SCr of 0.82 mg/dL). Liver Function Tests: Recent Labs  Lab 06/26/19 0957  AST 14*  ALT 14  ALKPHOS 73  BILITOT <0.1*  PROT 5.2*  ALBUMIN 2.2*   No results for input(s): LIPASE, AMYLASE in the last 168 hours. No results for input(s): AMMONIA in the last 168 hours. Coagulation Profile: Recent Labs  Lab 06/24/19 0113  INR 1.0   Cardiac Enzymes: No results for input(s): CKTOTAL, CKMB, CKMBINDEX, TROPONINI in the last 168 hours. BNP (last 3 results) No results for input(s): PROBNP in the last 8760 hours. HbA1C: No results for input(s): HGBA1C in the last 72 hours. CBG: Recent Labs  Lab 06/26/19 1211 06/26/19 1618 06/26/19 2049 06/27/19 0707 06/27/19 1148  GLUCAP 298* 232* 147* 156* 148*   Lipid Profile: No results for input(s): CHOL, HDL, LDLCALC, TRIG, CHOLHDL, LDLDIRECT in the last 72 hours. Thyroid Function Tests: No results for input(s): TSH, T4TOTAL, FREET4, T3FREE, THYROIDAB in the last 72 hours. Anemia Panel: No results for input(s): VITAMINB12, FOLATE, FERRITIN, TIBC, IRON, RETICCTPCT in the last 72 hours. Sepsis Labs: No results for input(s): PROCALCITON, LATICACIDVEN in the last 168 hours.  Recent Results (from the past 240 hour(s))  Blood Cultures x 2 sites     Status: None (Preliminary result)   Collection Time: 06/23/19  7:01 PM    Specimen: BLOOD LEFT ARM  Result Value Ref Range Status   Specimen Description BLOOD LEFT ARM  Final   Special Requests   Final    BOTTLES DRAWN AEROBIC ONLY Blood Culture results may not be optimal due to an inadequate volume of blood received in culture bottles   Culture   Final    NO GROWTH 4 DAYS Performed at [facility], [place] [address] [place], [place] [address]    Report Status PENDING  Incomplete  Blood Cultures x 2 sites     Status: None (Preliminary result)   Collection Time: 06/23/19  7:11 PM   Specimen: BLOOD RIGHT ARM  Result Value Ref Range Status   Specimen Description BLOOD RIGHT ARM  Final   Special Requests   Final    BOTTLES DRAWN AEROBIC ONLY Blood Culture results may not be optimal due to an inadequate volume of blood received in culture bottles   Culture   Final    NO GROWTH 4 DAYS Performed at [facility], [place] [address] [place], [place] [address]    Report Status PENDING  Incomplete     Radiology Studies: MR FOOT LEFT W WO CONTRAST  Result Date: 06/26/2019 CLINICAL DATA:  Peripheral arterial disease, left foot infection with cellulitis abscess EXAM: MRI OF THE LEFT FOREFOOT WITHOUT AND WITH CONTRAST TECHNIQUE: Multiplanar, multisequence MR imaging of the left was performed both before and after administration of intravenous contrast. CONTRAST:  109mL GADAVIST GADOBUTROL 1 MMOL/ML IV SOLN COMPARISON:  June 14, 2019 FINDINGS: The examination is limited due to patient motion. Bones/Joint/Cartilage Again noted is cortical irregularity with diffuse T1 hypointensity and T2 hyperintensity seen at the fifth metatarsal head, not significantly changed since the prior exam. There is increased T2 hyperintense signal seen throughout the remainder of the fifth metatarsal shaft. The remainder of the osseous structures appear to be grossly unremarkable. The joint spaces appear to be intact. Ligaments Limited due to patient motion, however the Lisfranc ligaments and  collateral ligaments appear to be intact. Muscles and Tendons Diffusely increased signal seen throughout the muscles surrounding the forefoot, likely due to denervation changes versus myositis. The flexor and extensor tendons appear to be intact. Soft tissues Overlying the lateral aspect of the fifth metatarsal head again noted is a focal area of ulceration with exposure of the underlying lateral aspect of the fifth metatarsal head. There is non loculated fluid seen within this region. Diffuse enhancement with skin thickening and subcutaneous edema seen. There is diffuse dorsal soft tissue edema. There is superficial ulceration also noted on the dorsum of the foot at the level of the third metatarsal which appears to be new from the prior exam, series 5, image 19. IMPRESSION: 1.  Limited examination due to patient motion. 2. Findings again suggestive of acute osteomyelitis involving the fifth metatarsal head with area of ulceration overlying the metatarsal head extending to the osseous surface. Non loculated fluid seen within this region likely phlegmon/sinus tract. 3. Reactive marrow seen throughout the remainder of the fifth metatarsal head. 4. Diffuse dorsal subcutaneous edema and cellulitis. 5. New superficial area of ulceration on the dorsum of the foot overlying the third metatarsal. 6. Diffuse signal changes throughout the muscles, likely due to denervation changes versus myositis. Electronically Signed   By: Prudencio Pair M.D.   On: 06/26/2019 23:18   [place] EKG SITE RITE  Result Date: 06/26/2019 If Site Rite image not attached, placement could not be confirmed due to current cardiac rhythm.   Scheduled Meds: . aspirin EC  81 mg Oral Daily  . atorvastatin  40 mg Oral q1800  . canagliflozin  100 mg Oral QAC breakfast  . Chlorhexidine Gluconate Cloth  6 each Topical Daily  . clopidogrel  75 mg Oral Q breakfast  . diazepam  5 mg Oral BID  . DULoxetine  30 mg Oral Daily  . enoxaparin (LOVENOX)  injection  40 mg Subcutaneous Daily  . gabapentin  900 mg Oral TID  . insulin aspart  0-15 Units Subcutaneous TID WC  . insulin aspart  0-5 Units Subcutaneous QHS  . insulin detemir  30 Units Subcutaneous QHS  . levETIRAcetam  500 mg Oral BID  . lisinopril  20 mg Oral Daily  . metroNIDAZOLE  500 mg Oral Q8H  . multivitamin with minerals  1 tablet Oral Daily  . nutrition supplement (JUVEN)  1 packet Oral BID BM  . nystatin cream   Topical BID  . pantoprazole  40 mg Oral Daily  . polyethylene glycol  17 g Oral Daily  . potassium chloride  40 mEq Oral BID WC  . Ensure Max Protein  11 oz Oral QHS  . risperiDONE  0.5 mg  Oral QHS  . sodium chloride flush  10-40 mL Intracatheter Q12H  . sodium chloride flush  3 mL Intravenous Q12H  . traZODone  200 mg Oral QHS   Continuous Infusions: . sodium chloride 100 mL/hr at 06/27/19 1038  . sodium chloride    .  ceFAZolin (ANCEF) IV 2 g (06/27/19 [id number])     LOS: 4 days   Time spent: 30 minutes.  Lorella Nimrod, MD [facility] Pager [phone]  If 7PM-7AM, please contact night-coverage [email or web address] Password [facility] 06/27/2019, 3:50 PM   This record has been created using Systems analyst. Errors have been sought and corrected,but may not always be located. Such creation errors do not reflect on the standard of care.

## 2019-06-28 DIAGNOSIS — E11628 Type 2 diabetes mellitus with other skin complications: Secondary | ICD-10-CM

## 2019-06-28 DIAGNOSIS — L089 Local infection of the skin and subcutaneous tissue, unspecified: Secondary | ICD-10-CM

## 2019-06-28 DIAGNOSIS — I739 Peripheral vascular disease, unspecified: Secondary | ICD-10-CM

## 2019-06-28 DIAGNOSIS — M86472 Chronic osteomyelitis with draining sinus, left ankle and foot: Secondary | ICD-10-CM

## 2019-06-28 LAB — GLUCOSE, CAPILLARY
Glucose-Capillary: 188 mg/dL — ABNORMAL HIGH (ref 70–99)
Glucose-Capillary: 164 mg/dL — ABNORMAL HIGH (ref 70–99)
Glucose-Capillary: 175 mg/dL — ABNORMAL HIGH (ref 70–99)
Glucose-Capillary: 249 mg/dL — ABNORMAL HIGH (ref 70–99)

## 2019-06-28 LAB — CBC
HCT: 31.5 % — ABNORMAL LOW (ref 36.0–46.0)
Hemoglobin: 8.9 g/dL — ABNORMAL LOW (ref 12.0–15.0)
MCH: 22.4 pg — ABNORMAL LOW (ref 26.0–34.0)
MCHC: 28.3 g/dL — ABNORMAL LOW (ref 30.0–36.0)
MCV: 79.3 fL — ABNORMAL LOW (ref 80.0–100.0)
Platelets: 413 K/uL — ABNORMAL HIGH (ref 150–400)
RBC: 3.97 MIL/uL (ref 3.87–5.11)
RDW: 16 % — ABNORMAL HIGH (ref 11.5–15.5)
WBC: 9.6 K/uL (ref 4.0–10.5)
nRBC: 0.2 % (ref 0.0–0.2)

## 2019-06-28 NOTE — Progress Notes (Signed)
Patient has undergone atherectomy and DCB of the left SFA and popliteal artery.  MRI of the foot show 5th metatarsal head osteo.  There is a fluid collection around this area as well as diffuse subcutaneous edema and cellulitis.    I would continue ABX.  I have asked Dr. Sharol Given to evaluate the foot.  She will need ASA and Plavix for at least 3 months and then consideration can be made for stopping the Plavix.  Continue statin therapy   Taylor [name]

## 2019-06-28 NOTE — Progress Notes (Signed)
PROGRESS NOTE    Taylor [name]  [id number] DOB: Jan 18, 1981 DOA: 06/23/2019 PCP: Monico Blitz, MD   Brief Narrative:  Taylor [name] is a 39 y.o. female with medical history significant for type 2 diabetes, previous stroke, seizures, hypertension, and hyperlipidemia.  Patient was hospitalized at [facility] facility from 06/12/2019 to 06/22/2018 for left foot infection with cellulitis and abscess complicated by left fifth toe osteomyelitis, necrotic ulcer.  I&D was performed and culture grew pan MSSA.  She did had abnormal [name] and she was transferred to [facility] for vascular surgery evaluation.  Initially treated with cefepime and vancomycin Antibiotics narrowed to cefazolin based on culture results, Flagyl was added by ID for anaerobic coverage.  Subjective: Patient was complaining of left foot pain during morning rounds.   Assessment & Plan:   Principal Problem:   Osteomyelitis of left foot (HCC) Active Problems:   Diabetic foot infection (HCC)   PAD (peripheral artery disease) (HCC)  Left foot infection and osteomyelitis of left fifth toe and necrosis: Peripheral arterial disease. [name] shows bilateral lower extremity peripheral arterial disease. Vascular surgery was consulted and patient was taken for angiography/angioplasty, drug-coated balloon angioplasty was done on left superficial femoral and popliteal artery with good response. -Antibiotics narrowed to cefazolin based on her IND culture results, -Flagyl was added by ID to cover for anaerobics. -She will need 6 weeks of antibiotics for osteomyelitis-this duration will be changed if vascular surgery take her for toe amputation which is under consideration at this time. Dr. Sharol Given was consulted by vascular surgery-he will reevaluate her on Monday morning for a possible fifth metatarsal amputation and debridement. -Continue aspirin and Plavix for 26-month and follow-up with vascular surgery. -PT/OT consult for home health  needs. -Continue Vicodin 2 tablets every 6 hourly.  Type 2 diabetes.  Uncontrolled with A1c of 10. CBG improved. -Continue Lantus  35 units at bedtime. -Continue moderate SSI.  History of CVA.  No new neurologic deficit. S/P endovascular revascularization of left vertebral artery stenosis. -Continue aspirin and statin. -Continue Plavix.  Seizure disorder.  No recent seizure-like activity. -Continue Keppra  Bipolar with depression.  -Continue home dose of Cymbalta. -Continue risperidone.  Hypertension.  Currently normotensive. -Continue lisinopril.  Objective: Vitals:   06/27/19 2133 06/28/19 0542 06/28/19 0952 06/28/19 1544  BP: 119/70 (!) 132/91 (!) 150/102 135/72  Pulse: 94 81  98  Resp:  18  18  Temp: 97.7 F (36.5 C) (!) 97.5 F (36.4 C)  98.4 F (36.9 C)  TempSrc: Oral Oral  Oral  SpO2: 100% 97%  97%  Weight:  93.9 kg    Height:        Intake/Output Summary (Last 24 hours) at 06/28/2019 1704 Last data filed at 06/28/2019 1351 Gross per 24 hour  Intake 6160 ml  Output 1600 ml  Net 4560 ml   Filed Weights   06/26/19 0624 06/27/19 0636 06/28/19 0542  Weight: 94.1 kg 96.6 kg 93.9 kg    Examination:  General exam: Appears calm and comfortable  Respiratory system: Clear to auscultation. Respiratory effort normal. Cardiovascular system: S1 & S2 heard, RRR. No JVD, murmurs, rubs, gallops or clicks. Gastrointestinal system: Soft, nontender, nondistended, bowel sounds positive. Central nervous system: Alert and oriented. No focal neurological deficits.Symmetric 5 x 5 power. Extremities: Left foot with bandage which show some serosanguineous discharge.  Pulses diminished bilaterally. Skin: No rashes, lesions or ulcers Psychiatry: Judgement and insight appear normal.     DVT prophylaxis: Lovenox Code Status: Full Family  Communication: No family at bedside Disposition Plan: Pending improvement.  Consultants:   Vascular  surgery  ID  Orthopedic  Procedures:  Angiography and PCI of left femoral and popliteal arteries.  Antimicrobials:  Cefazolin Flagyl  Data Reviewed: I have personally reviewed following labs and imaging studies  CBC: Recent Labs  Lab 06/23/19 1911 06/24/19 0113 06/25/19 0214 06/26/19 0957 06/28/19 1018  WBC 9.5 11.1* 9.2 10.3 9.6  NEUTROABS 7.1  --   --  8.1*  --   HGB 8.7* 9.1* 8.6* 7.8* 8.9*  HCT 30.3* 32.0* 30.1* 26.8* 31.5*  MCV 78.3* 79.2* 78.0* 77.7* 79.3*  PLT 388 390 353 376 [id number]*   Basic Metabolic Panel: Recent Labs  Lab 06/23/19 1911 06/24/19 0113 06/25/19 0214 06/26/19 0957  NA 139 139 140 137  K 3.2* 3.4* 3.9 3.8  CL 100 98 103 101  CO2 27 29 27 28   GLUCOSE 267* 310* 231* 368*  BUN 11 9 10 13   CREATININE 0.75 0.77 0.74 0.82  CALCIUM 8.5* 8.4* 8.6* 8.2*  MG  --  1.4* 2.2  --    GFR: Estimated Creatinine Clearance: 102.4 mL/min (by C-G formula based on SCr of 0.82 mg/dL). Liver Function Tests: Recent Labs  Lab 06/26/19 0957  AST 14*  ALT 14  ALKPHOS 73  BILITOT <0.1*  PROT 5.2*  ALBUMIN 2.2*   No results for input(s): LIPASE, AMYLASE in the last 168 hours. No results for input(s): AMMONIA in the last 168 hours. Coagulation Profile: Recent Labs  Lab 06/24/19 0113  INR 1.0   Cardiac Enzymes: No results for input(s): CKTOTAL, CKMB, CKMBINDEX, TROPONINI in the last 168 hours. BNP (last 3 results) No results for input(s): PROBNP in the last 8760 hours. HbA1C: No results for input(s): HGBA1C in the last 72 hours. CBG: Recent Labs  Lab 06/27/19 1148 06/27/19 1648 06/27/19 2123 06/28/19 0743 06/28/19 1150  GLUCAP 148* 177* 233* 188* 164*   Lipid Profile: No results for input(s): CHOL, HDL, LDLCALC, TRIG, CHOLHDL, LDLDIRECT in the last 72 hours. Thyroid Function Tests: No results for input(s): TSH, T4TOTAL, FREET4, T3FREE, THYROIDAB in the last 72 hours. Anemia Panel: No results for input(s): VITAMINB12, FOLATE, FERRITIN, TIBC,  IRON, RETICCTPCT in the last 72 hours. Sepsis Labs: No results for input(s): PROCALCITON, LATICACIDVEN in the last 168 hours.  Recent Results (from the past 240 hour(s))  Blood Cultures x 2 sites     Status: None (Preliminary result)   Collection Time: 06/23/19  7:01 PM   Specimen: BLOOD LEFT ARM  Result Value Ref Range Status   Specimen Description BLOOD LEFT ARM  Final   Special Requests   Final    BOTTLES DRAWN AEROBIC ONLY Blood Culture results may not be optimal due to an inadequate volume of blood received in culture bottles   Culture   Final    NO GROWTH 4 DAYS Performed at [facility], [place] [address], [place], [place] [address]    Report Status PENDING  Incomplete  Blood Cultures x 2 sites     Status: None (Preliminary result)   Collection Time: 06/23/19  7:11 PM   Specimen: BLOOD RIGHT ARM  Result Value Ref Range Status   Specimen Description BLOOD RIGHT ARM  Final   Special Requests   Final    BOTTLES DRAWN AEROBIC ONLY Blood Culture results may not be optimal due to an inadequate volume of blood received in culture bottles   Culture   Final    NO GROWTH  4 DAYS Performed at [facility], [place] [address], [place], [place] [address]    Report Status PENDING  Incomplete     Radiology Studies: MR FOOT LEFT W WO CONTRAST  Result Date: 06/26/2019 CLINICAL DATA:  Peripheral arterial disease, left foot infection with cellulitis abscess EXAM: MRI OF THE LEFT FOREFOOT WITHOUT AND WITH CONTRAST TECHNIQUE: Multiplanar, multisequence MR imaging of the left was performed both before and after administration of intravenous contrast. CONTRAST:  47mL GADAVIST GADOBUTROL 1 MMOL/ML IV SOLN COMPARISON:  June 14, 2019 FINDINGS: The examination is limited due to patient motion. Bones/Joint/Cartilage Again noted is cortical irregularity with diffuse T1 hypointensity and T2 hyperintensity seen at the fifth metatarsal head, not significantly changed since the prior exam. There is  increased T2 hyperintense signal seen throughout the remainder of the fifth metatarsal shaft. The remainder of the osseous structures appear to be grossly unremarkable. The joint spaces appear to be intact. Ligaments Limited due to patient motion, however the Lisfranc ligaments and collateral ligaments appear to be intact. Muscles and Tendons Diffusely increased signal seen throughout the muscles surrounding the forefoot, likely due to denervation changes versus myositis. The flexor and extensor tendons appear to be intact. Soft tissues Overlying the lateral aspect of the fifth metatarsal head again noted is a focal area of ulceration with exposure of the underlying lateral aspect of the fifth metatarsal head. There is non loculated fluid seen within this region. Diffuse enhancement with skin thickening and subcutaneous edema seen. There is diffuse dorsal soft tissue edema. There is superficial ulceration also noted on the dorsum of the foot at the level of the third metatarsal which appears to be new from the prior exam, series 5, image 19. IMPRESSION: 1.  Limited examination due to patient motion. 2. Findings again suggestive of acute osteomyelitis involving the fifth metatarsal head with area of ulceration overlying the metatarsal head extending to the osseous surface. Non loculated fluid seen within this region likely phlegmon/sinus tract. 3. Reactive marrow seen throughout the remainder of the fifth metatarsal head. 4. Diffuse dorsal subcutaneous edema and cellulitis. 5. New superficial area of ulceration on the dorsum of the foot overlying the third metatarsal. 6. Diffuse signal changes throughout the muscles, likely due to denervation changes versus myositis. Electronically Signed   By: Prudencio Pair M.D.   On: 06/26/2019 23:18    Scheduled Meds: . aspirin EC  81 mg Oral Daily  . atorvastatin  40 mg Oral q1800  . canagliflozin  100 mg Oral QAC breakfast  . Chlorhexidine Gluconate Cloth  6 each Topical  Daily  . clopidogrel  75 mg Oral Q breakfast  . diazepam  5 mg Oral BID  . DULoxetine  30 mg Oral Daily  . enoxaparin (LOVENOX) injection  40 mg Subcutaneous Daily  . gabapentin  900 mg Oral TID  . insulin aspart  0-15 Units Subcutaneous TID WC  . insulin aspart  0-5 Units Subcutaneous QHS  . insulin detemir  30 Units Subcutaneous QHS  . levETIRAcetam  500 mg Oral BID  . lisinopril  20 mg Oral Daily  . metroNIDAZOLE  500 mg Oral Q8H  . multivitamin with minerals  1 tablet Oral Daily  . nutrition supplement (JUVEN)  1 packet Oral BID BM  . nystatin cream   Topical BID  . pantoprazole  40 mg Oral Daily  . polyethylene glycol  17 g Oral Daily  . potassium chloride  40 mEq Oral BID WC  . Ensure Max Protein  11 oz Oral QHS  . risperiDONE  0.5 mg Oral QHS  . sodium chloride flush  10-40 mL Intracatheter Q12H  . sodium chloride flush  3 mL Intravenous Q12H  . traZODone  200 mg Oral QHS   Continuous Infusions: . sodium chloride 100 mL/hr at 06/27/19 2036  . sodium chloride    .  ceFAZolin (ANCEF) IV 2 g (06/28/19 1404)     LOS: 5 days   Time spent: 30 minutes.  Lorella Nimrod, MD [facility] Pager [phone]  If 7PM-7AM, please contact night-coverage [email or web address] Password [email or web address] 06/28/2019, 5:04 PM   This record has been created using Dragon voice recognition software. Errors have been sought and corrected,but may not always be located. Such creation errors do not reflect on the standard of care.

## 2019-06-28 NOTE — Progress Notes (Signed)
[facility] for Infectious Disease  Date of Admission:  06/23/2019     Total days of antibiotics 6 Day 5 of Cefazolin          ASSESSMENT:  Ms. Danz continues to have pain in her left lower extremity otherwise she has been afebrile. Awaiting surgical consult from Dr. Sharol Given for additional recommendations. Will likely require prolonged antibiotic therapy unless amputation is performed of at least 6 weeks with Cefazolin and metronidazole. She has a PICC line in place and will set OPAT orders pending discharge.  PLAN:  1. Continue cefazolin and metronidazole. 2. OPAT orders pending.   Principal Problem:   Osteomyelitis of left foot ([place]) Active Problems:   Diabetic foot infection ([place])   PAD (peripheral artery disease) ([place])   . aspirin EC  81 mg Oral Daily  . atorvastatin  40 mg Oral q1800  . canagliflozin  100 mg Oral QAC breakfast  . Chlorhexidine Gluconate Cloth  6 each Topical Daily  . clopidogrel  75 mg Oral Q breakfast  . diazepam  5 mg Oral BID  . DULoxetine  30 mg Oral Daily  . enoxaparin (LOVENOX) injection  40 mg Subcutaneous Daily  . gabapentin  900 mg Oral TID  . insulin aspart  0-15 Units Subcutaneous TID WC  . insulin aspart  0-5 Units Subcutaneous QHS  . insulin detemir  30 Units Subcutaneous QHS  . levETIRAcetam  500 mg Oral BID  . lisinopril  20 mg Oral Daily  . metroNIDAZOLE  500 mg Oral Q8H  . multivitamin with minerals  1 tablet Oral Daily  . nutrition supplement (JUVEN)  1 packet Oral BID BM  . nystatin cream   Topical BID  . pantoprazole  40 mg Oral Daily  . polyethylene glycol  17 g Oral Daily  . potassium chloride  40 mEq Oral BID WC  . Ensure Max Protein  11 oz Oral QHS  . risperiDONE  0.5 mg Oral QHS  . sodium chloride flush  10-40 mL Intracatheter Q12H  . sodium chloride flush  3 mL Intravenous Q12H  . traZODone  200 mg Oral QHS    SUBJECTIVE:  Afebrile overnight with no acute events. Continues to have pain in her left foot and  requesting additional pain medication.   Allergies  Allergen Reactions  . Contrast Media [Iodinated Diagnostic Agents] Other (See Comments)    Went into kidney failure   . Amitriptyline Other (See Comments)    Unresponsive   . Compazine Other (See Comments)    Alert but unable to move  . Morphine And Related Other (See Comments)    Chest pain  . Oxycodone Nausea And Vomiting    Severe n/v -  patient will not take oxycodone or percocet.  . Prochlorperazine Other (See Comments)    Alert but unable to move  . Promethazine Swelling    Lips swell  . Sumatriptan Rash     Review of Systems: Review of Systems  Constitutional: Negative for chills, fever and weight loss.  Respiratory: Negative for cough, shortness of breath and wheezing.   Cardiovascular: Negative for chest pain and leg swelling.  Gastrointestinal: Negative for abdominal pain, constipation, diarrhea, nausea and vomiting.  Musculoskeletal:       Positive for left foot pain  Skin: Negative for rash.      OBJECTIVE: Vitals:   06/27/19 1031 06/27/19 1501 06/27/19 2133 06/28/19 0542  BP: 116/76 112/67 119/70 (!) 132/91  Pulse:  93 94 81  Resp:  18  18  Temp:  (!) 97.4 F (36.3 C) 97.7 F (36.5 C) (!) 97.5 F (36.4 C)  TempSrc:  Oral Oral Oral  SpO2:  99% 100% 97%  Weight:    93.9 kg  Height:       Body mass index is 35.53 kg/m.  Physical Exam Constitutional:      General: She is not in acute distress.    Appearance: She is well-developed.  Cardiovascular:     Rate and Rhythm: Normal rate and regular rhythm.     Heart sounds: Normal heart sounds.     Comments: PICC line in left upper arm with dressing that is clean and dry and without evidence of infection.  Pulmonary:     Effort: Pulmonary effort is normal.     Breath sounds: Normal breath sounds.  Skin:    General: Skin is warm and dry.  Neurological:     Mental Status: She is alert and oriented to person, place, and time.  Psychiatric:         Behavior: Behavior normal.        Thought Content: Thought content normal.        Judgment: Judgment normal.     Lab Results Lab Results  Component Value Date   WBC 10.3 06/26/2019   HGB 7.8 (L) 06/26/2019   HCT 26.8 (L) 06/26/2019   MCV 77.7 (L) 06/26/2019   PLT 376 06/26/2019    Lab Results  Component Value Date   CREATININE 0.82 06/26/2019   BUN 13 06/26/2019   NA 137 06/26/2019   K 3.8 06/26/2019   CL 101 06/26/2019   CO2 28 06/26/2019    Lab Results  Component Value Date   ALT 14 06/26/2019   AST 14 (L) 06/26/2019   ALKPHOS 73 06/26/2019   BILITOT <0.1 (L) 06/26/2019     Microbiology: Recent Results (from the past 240 hour(s))  Blood Cultures x 2 sites     Status: None (Preliminary result)   Collection Time: 06/23/19  7:01 PM   Specimen: BLOOD LEFT ARM  Result Value Ref Range Status   Specimen Description BLOOD LEFT ARM  Final   Special Requests   Final    BOTTLES DRAWN AEROBIC ONLY Blood Culture results may not be optimal due to an inadequate volume of blood received in culture bottles   Culture   Final    NO GROWTH 4 DAYS Performed at [facility], [place] [address], [place], [place] [address]    Report Status PENDING  Incomplete  Blood Cultures x 2 sites     Status: None (Preliminary result)   Collection Time: 06/23/19  7:11 PM   Specimen: BLOOD RIGHT ARM  Result Value Ref Range Status   Specimen Description BLOOD RIGHT ARM  Final   Special Requests   Final    BOTTLES DRAWN AEROBIC ONLY Blood Culture results may not be optimal due to an inadequate volume of blood received in culture bottles   Culture   Final    NO GROWTH 4 DAYS Performed at [facility], [place] [address], [place], [place] [address]    Report Status PENDING  Incomplete     Terri Piedra, [place] [facility] [place] Pager  06/28/2019  8:37 AM

## 2019-06-28 NOTE — Consult Note (Signed)
ORTHOPAEDIC CONSULTATION  REQUESTING PHYSICIAN: Lorella Nimrod, MD  Chief Complaint: Gangrenous ulcer lateral aspect dorsally left foot.  HPI: Taylor [name] is a 39 y.o. female who presents with uncontrolled type 2 diabetes with severe peripheral vascular disease.  She is status post revascularization and complains of painful gangrenous ulcers over the dorsal lateral aspect of the left foot.  Patient states she has memory loss this secondary to previous strokes.  Patient and her mother were both unaware that she had revascularization surgery Tuesday.  Past Medical History:  Diagnosis Date  . Anxiety   . ARF (acute renal failure) ([place]) 2013  . Bipolar disorder ([place])   . Cerebral artery occlusion with cerebral infarction ([place]) 11/13   due to elevated blood sugar 1700-off insulin 6 months  . Depression   . Diabetes mellitus    off insulin 6 months  . Diverticulitis   . Dyspnea    when walking  . GERD (gastroesophageal reflux disease)   . Headache(784.0)   . Hypertension   . Kidney dialysis status    on dialysis(stroke) off after 4-6 weeks  . Pneumonia 2013, 06/2018  . Respiratory failure ([place]) 11/13   with stroke - in hosp [facility] 12 weeks  . Seizures ([place])    during elvated blood sugsr episode 11/13  . Sepsis ([place])   . Stroke ([place])    11/2018, 12/2018 Weak left side, speech- slurred, Short term memeroy, Gait unsteady   . Vocal cord dysfunction    Past Surgical History:  Procedure Laterality Date  . ABDOMINAL AORTOGRAM W/LOWER EXTREMITY N/A 06/25/2019   Procedure: ABDOMINAL AORTOGRAM W/LOWER EXTREMITY;  Surgeon: Serafina Mitchell, MD;  Location: [facility];  Service: Cardiovascular;  Laterality: N/A;  . APPENDECTOMY    . INSERTION OF DIALYSIS CATHETER  11/13   removed in 4-6 weeks  . IR ANGIO INTRA EXTRACRAN SEL COM CAROTID INNOMINATE BILAT MOD SED  02/07/2019  . IR ANGIO VERTEBRAL SEL VERTEBRAL BILAT MOD SED  02/07/2019  . IR ANGIOGRAM EXTREMITY LEFT  02/07/2019  . IR  ANGIOGRAM EXTREMITY RIGHT  02/20/2019  . IR TRANSCATH EXCRAN VERT OR CAR A STENT  02/20/2019  . IR TRANSCATH EXCRAN VERT OR CAR A STENT  03/25/2019  . IR US GUIDE VASC ACCESS RIGHT  02/07/2019  . MULTIPLE EXTRACTIONS WITH ALVEOLOPLASTY N/A 02/24/2014   Procedure: MULTIPLE EXTRACTION WITH ALVEOLOPLASTY AND BIOPSY;  Surgeon: Gae Bon, DDS;  Location: [facility];  Service: Oral Surgery;  Laterality: N/A;  . PERIPHERAL VASCULAR ATHERECTOMY Left 06/25/2019   Procedure: PERIPHERAL VASCULAR ATHERECTOMY;  Surgeon: Serafina Mitchell, MD;  Location: [facility];  Service: Cardiovascular;  Laterality: Left;  superficial femoral  . RADIOLOGY WITH ANESTHESIA N/A 02/20/2019   Procedure: RADIOLOGY WITH ANESTHESIA   STENTING;  Surgeon: Luanne Bras, MD;  Location: [facility];  Service: Radiology;  Laterality: N/A;  . RADIOLOGY WITH ANESTHESIA N/A 03/25/2019   Procedure: RADIOLOGY WITH ANESTHESIA STENTING;  Surgeon: Luanne Bras, MD;  Location: [facility];  Service: Radiology;  Laterality: N/A;  . TRACHEOSTOMY  11/13   closed 1/14  . TRANSRECTAL DRAINAGE OF PELVIC ABSCESS  12/11   Social History   Socioeconomic History  . Marital status: Single    Spouse name: Not on file  . Number of children: 0  . Years of education: 1  . Highest education level: Not on file  Occupational History  . Not on file  Tobacco Use  . Smoking status: Former Smoker  Packs/day: 0.60    Years: 23.00    Pack years: 13.80    Types: Cigarettes    Quit date: 02/05/2019    Years since quitting: 0.3  . Smokeless tobacco: Never Used  Substance and Sexual Activity  . Alcohol use: No    Alcohol/week: 0.0 standard drinks  . Drug use: No  . Sexual activity: Not Currently    Birth control/protection: None  Other Topics Concern  . Not on file  Social History Narrative   She is single and does not have any children.    She has 2 yrs of college level education.   She has 5-6 caffeine drinks daily.    Left handed       Living with parents.   Social Determinants of Health   Financial Resource Strain:   . Difficulty of Paying Living Expenses: Not on file  Food Insecurity:   . Worried About Charity fundraiser in the Last Year: Not on file  . Ran Out of Food in the Last Year: Not on file  Transportation Needs:   . Lack of Transportation (Medical): Not on file  . Lack of Transportation (Non-Medical): Not on file  Physical Activity:   . Days of Exercise per Week: Not on file  . Minutes of Exercise per Session: Not on file  Stress:   . Feeling of Stress : Not on file  Social Connections:   . Frequency of Communication with Friends and Family: Not on file  . Frequency of Social Gatherings with Friends and Family: Not on file  . Attends Religious Services: Not on file  . Active Member of Clubs or Organizations: Not on file  . Attends Archivist Meetings: Not on file  . Marital Status: Not on file   Family History  Problem Relation Age of Onset  . Lung cancer Maternal Grandmother   . Heart attack Maternal Grandfather   . Diabetes Paternal Grandfather    - negative except otherwise stated in the family history section Allergies  Allergen Reactions  . Contrast Media [Iodinated Diagnostic Agents] Other (See Comments)    Went into kidney failure   . Amitriptyline Other (See Comments)    Unresponsive   . Compazine Other (See Comments)    Alert but unable to move  . Morphine And Related Other (See Comments)    Chest pain  . Oxycodone Nausea And Vomiting    Severe n/v -  patient will not take oxycodone or percocet.  . Prochlorperazine Other (See Comments)    Alert but unable to move  . Promethazine Swelling    Lips swell  . Sumatriptan Rash   Prior to Admission medications   Medication Sig Start Date End Date Taking? Authorizing Provider  acetaminophen (TYLENOL) 500 MG tablet Take 500 mg by mouth every 6 (six) hours as needed for headache (pain). pain    Yes [provider]    aspirin 325 MG tablet Take 325 mg by mouth daily.   Yes [provider]  atorvastatin (LIPITOR) 40 MG tablet Take 40 mg by mouth daily.   Yes [provider]  clopidogrel (PLAVIX) 75 MG tablet TAKE 1 TABLET BY MOUTH DAILY Patient taking differently: Take 75 mg by mouth daily.  06/03/19  Yes Patel, Donika K, DO  dapagliflozin propanediol (FARXIGA) 10 MG TABS tablet Take 10 mg by mouth daily.    Yes [provider]  diazepam (VALIUM) 5 MG tablet Take 5 mg by mouth 2 (two) times  daily.    Yes [provider]  diphenhydrAMINE (BENADRYL) 25 MG tablet Take 50 mg by mouth See admin instructions. Take 2 tablets (50 mg) by mouth one hour prior to procedure involving contrast dye   Yes [provider]  DULoxetine (CYMBALTA) 30 MG capsule Take 30 mg by mouth daily.   Yes [provider]  gabapentin (NEURONTIN) 300 MG capsule TAKE THREE CAPSULES BY MOUTH EVERY MORNING and TAKE THREE CAPSULES BY MOUTH AT NOON and TAKE FOUR CAPSULES BY MOUTH AT BEDTIME Patient taking differently: Take 900 mg by mouth 3 (three) times daily.  05/06/19  Yes Patel, Donika K, DO  ibuprofen (ADVIL) 600 MG tablet Take 600 mg by mouth 2 (two) times daily.  11/16/18  Yes [provider]  labetalol (NORMODYNE) 300 MG tablet Take 300 mg by mouth 2 (two) times daily.   Yes [provider]  levETIRAcetam (KEPPRA XR) 500 MG 24 hr tablet Take 500 mg by mouth 2 (two) times daily.    Yes [provider]  lisinopril (ZESTRIL) 40 MG tablet Take 40 mg by mouth daily.    Yes [provider]  metFORMIN (GLUCOPHAGE) 500 MG tablet Take 500 mg by mouth 2 (two) times daily. 11/16/18  Yes [provider]  omeprazole (PRILOSEC) 20 MG capsule Take 20 mg by mouth daily.   Yes [provider]  predniSONE (DELTASONE) 50 MG tablet Take 50 mg by mouth See admin instructions. Take one tablet (50 mg) by mouth 13 hours, 7 hours, and 1 hour before procedure  involving contrast dye 02/15/19  Yes [provider]  risperiDONE (RISPERDAL) 0.5 MG tablet Take 0.5 mg by mouth at bedtime.   Yes [provider]  traZODone (DESYREL) 100 MG tablet Take 200 mg by mouth at bedtime.    Yes [provider]   MR FOOT LEFT W WO CONTRAST  Result Date: 06/26/2019 CLINICAL DATA:  Peripheral arterial disease, left foot infection with cellulitis abscess EXAM: MRI OF THE LEFT FOREFOOT WITHOUT AND WITH CONTRAST TECHNIQUE: Multiplanar, multisequence MR imaging of the left was performed both before and after administration of intravenous contrast. CONTRAST:  41mL GADAVIST GADOBUTROL 1 MMOL/ML IV SOLN COMPARISON:  June 14, 2019 FINDINGS: The examination is limited due to patient motion. Bones/Joint/Cartilage Again noted is cortical irregularity with diffuse T1 hypointensity and T2 hyperintensity seen at the fifth metatarsal head, not significantly changed since the prior exam. There is increased T2 hyperintense signal seen throughout the remainder of the fifth metatarsal shaft. The remainder of the osseous structures appear to be grossly unremarkable. The joint spaces appear to be intact. Ligaments Limited due to patient motion, however the Lisfranc ligaments and collateral ligaments appear to be intact. Muscles and Tendons Diffusely increased signal seen throughout the muscles surrounding the forefoot, likely due to denervation changes versus myositis. The flexor and extensor tendons appear to be intact. Soft tissues Overlying the lateral aspect of the fifth metatarsal head again noted is a focal area of ulceration with exposure of the underlying lateral aspect of the fifth metatarsal head. There is non loculated fluid seen within this region. Diffuse enhancement with skin thickening and subcutaneous edema seen. There is diffuse dorsal soft tissue edema. There is superficial ulceration also noted on the dorsum of the foot at the level of the third metatarsal which  appears to be new from the prior exam, series 5, image 19. IMPRESSION: 1.  Limited examination due to patient motion. 2. Findings again suggestive of acute osteomyelitis  involving the fifth metatarsal head with area of ulceration overlying the metatarsal head extending to the osseous surface. Non loculated fluid seen within this region likely phlegmon/sinus tract. 3. Reactive marrow seen throughout the remainder of the fifth metatarsal head. 4. Diffuse dorsal subcutaneous edema and cellulitis. 5. New superficial area of ulceration on the dorsum of the foot overlying the third metatarsal. 6. Diffuse signal changes throughout the muscles, likely due to denervation changes versus myositis. Electronically Signed   By: Prudencio Pair M.D.   On: 06/26/2019 23:18   [place] EKG SITE RITE  Result Date: 06/26/2019 If Site Rite image not attached, placement could not be confirmed due to current cardiac rhythm.  - pertinent xrays, CT, MRI studies were reviewed and independently interpreted  Positive ROS: All other systems have been reviewed and were otherwise negative with the exception of those mentioned in the HPI and as above.  Physical Exam: General: Alert, no acute distress Psychiatric: Patient is competent for consent with normal mood and affect Lymphatic: No axillary or cervical lymphadenopathy Cardiovascular: No pedal edema Respiratory: No cyanosis, no use of accessory musculature GI: No organomegaly, abdomen is soft and non-tender    Images:  @ENCIMAGES @  Labs:  Lab Results  Component Value Date   HGBA1C 10.0 (H) 06/23/2019   ESRSEDRATE 96 (H) 06/23/2019   CRP 7.9 (H) 06/23/2019   REPTSTATUS PENDING 06/23/2019   CULT  06/23/2019    NO GROWTH 4 DAYS Performed at [facility], [place] [address], [place], [place] [address]     Lab Results  Component Value Date   ALBUMIN 2.2 (L) 06/26/2019   ALBUMIN 3.6 03/02/2012   PREALBUMIN 17.3 (L) 06/23/2019    Neurologic: Patient does not have  protective sensation bilateral lower extremities.   MUSCULOSKELETAL:   Skin: Examination patient has black gangrenous ulcers over the dorsal lateral aspect of the left foot there is no ascending cellulitis.  Patient is status post revascularization on Monday.  Laboratory studies show an uncontrolled type 2 diabetes with a hemoglobin A1c of 10 and severe protein caloric malnutrition with an albumin of 2.2.  Review of the MRI scan shows osteomyelitis involving the fifth metatarsal head.  Assessment: Assessment: Diabetic insensate neuropathy with severe peripheral vascular disease and severe protein caloric malnutrition with osteomyelitis and ulceration over the dorsal lateral aspect of the left foot.  Plan: Plan: I will reevaluate on Monday discussed with the patient we will plan for partial lateral ray amputations with debridement of the gangrenous changes and possible skin graft and placement of a wound VAC.  Risks and benefits were discussed including risk of these wounds not healing and need for additional surgery.  Thank you for the consult and the opportunity to see Ms. Lidia Collum, MD [facility] [phone] 3:52 PM

## 2019-06-28 NOTE — Progress Notes (Signed)
PHARMACY CONSULT NOTE FOR:  OUTPATIENT  PARENTERAL ANTIBIOTIC THERAPY (OPAT)  Indication: Osteomyelitis of left foot with MSSA/Group B strep Regimen: Cefazolin 2 gm IV Q 8 hours + Metronidazole 500 mg PO Q 8 hours  End date: 08/06/19  IV antibiotic discharge orders are pended. To discharging provider:  please sign these orders via discharge navigator,  Select New Orders & click on the button choice - Manage This Unsigned Work.     Thank you for allowing pharmacy to be a part of this patient's care.   Jimmy Footman, PharmD, BCPS, [facility] Clinical Pharmacist Phone: [phone] 06/28/2019, 12:42 PM

## 2019-06-28 NOTE — Consult Note (Signed)
PV Consult acknowledged and chart reviewed. I have been following this patient via medical records since her admission on 06/23/2019. I was able to speak with patient's mother Lattie Haw) who is currently at bedside, and introduce myself and role today.  Mother had some questions regarding plan of care because she has not been able to speak directly with vascular doctor. She arrived around 12:30ish today and Dr Stephens Shire visit was around 12:15, so she just missed him. Mother has access to patient's My Chart account and has been able to read many of the notes to stay current on information, however says there are few things she is not able to understand. I was able to relate Dr Stephens Shire progress note to mother and his recommendations for patient to continue antibiotic therapy, ASA and Plavix for now and  to consult Dr Sharol Given (ortho) for her foot.  ID notes suggests shorter term of antibiotic therapy may be considered pending amputation of the infected toe.  Mother communicates that patient has short term memory loss and can't remember what may have been discussed. Mother would prefer to be called at [phone] with any updates/changes/questions regarding her daughters care.  Mother states discharge goal is for her to return home with her with [facility] services. States she will need a glucose meter and insulin supplies as documented in Diabetes Coordinator note.  Most recent HgbA1C- 10.  Other active disciplines include vascular, ID, SW and Diabetes Coordinator.  No other questions/barriers conveyed at this time. PV Navigator contact information relayed to mother should questions arise. Will await ortho consult for recommendations. Thank you, Cletis Media RN BSN [place] [phone]

## 2019-06-28 NOTE — Progress Notes (Signed)
    [facility] [name] for Infectious Disease  Date of Admission:  06/23/2019            Diagnosis: Osteomyelitis   Culture Result: Group B Streptococcus / MSSA  Allergies  Allergen Reactions  . Contrast Media [Iodinated Diagnostic Agents] Other (See Comments)    Went into kidney failure   . Amitriptyline Other (See Comments)    Unresponsive   . Compazine Other (See Comments)    Alert but unable to move  . Morphine And Related Other (See Comments)    Chest pain  . Oxycodone Nausea And Vomiting    Severe n/v -  patient will not take oxycodone or percocet.  . Prochlorperazine Other (See Comments)    Alert but unable to move  . Promethazine Swelling    Lips swell  . Sumatriptan Rash    OPAT Orders Discharge antibiotics: Cefazolin and Metronidazole Per pharmacy protocol   Duration: 6 weeks  End Date: 08/06/2019  [facility] Care Per Protocol:  Labs weekly while on IV antibiotics: _X_ CBC with differential __ BMP _X_ CMP _X_ CRP _X_ ESR __ Vancomycin trough __ CK  _X_ Please pull PIC at completion of IV antibiotics __ Please leave PIC in place until doctor has seen patient or been notified  Fax weekly labs to [phone]  Clinic Follow Up Appt:  07/30/19 at 9:30 am with Dr. Ladona Horns, NP [place] for [place] [phone] Pager  06/28/2019  11:59 AM

## 2019-06-28 NOTE — Plan of Care (Signed)
  Problem: Clinical Measurements: Goal: Respiratory complications will improve Outcome: Progressing Goal: Cardiovascular complication will be avoided Outcome: Progressing   

## 2019-06-29 LAB — GLUCOSE, CAPILLARY
Glucose-Capillary: 181 mg/dL — ABNORMAL HIGH (ref 70–99)
Glucose-Capillary: 233 mg/dL — ABNORMAL HIGH (ref 70–99)
Glucose-Capillary: 212 mg/dL — ABNORMAL HIGH (ref 70–99)
Glucose-Capillary: 206 mg/dL — ABNORMAL HIGH (ref 70–99)

## 2019-06-29 NOTE — Progress Notes (Signed)
PROGRESS NOTE    CATIA HIERONYMUS  [id number] DOB: November 18, 1980 DOA: 06/23/2019 PCP: Monico Blitz, MD   Brief Narrative:  Taylor [name] is a 39 y.o. female with medical history significant for type 2 diabetes, previous stroke, seizures, hypertension, and hyperlipidemia.  Patient was hospitalized at [facility] facility from 06/12/2019 to 06/22/2018 for left foot infection with cellulitis and abscess complicated by left fifth toe osteomyelitis, necrotic ulcer.  I&D was performed and culture grew pan MSSA.  She did had abnormal [name] and she was transferred to [facility] for vascular surgery evaluation.  Initially treated with cefepime and vancomycin Antibiotics narrowed to cefazolin based on culture results, Flagyl was added by ID for anaerobic coverage.  Subjective: Left foot pain persists but better  Assessment & Plan:   Principal Problem:   Osteomyelitis of left foot (HCC) Active Problems:   Diabetic foot infection (HCC)   PAD (peripheral artery disease) (HCC)  Left foot infection and osteomyelitis of left fifth toe and necrosis: Peripheral arterial disease. [name] shows bilateral lower extremity peripheral arterial disease. Vascular surgery was consulted and patient was taken for angiography/angioplasty, drug-coated balloon angioplasty was done on left superficial femoral and popliteal artery with good response. -Antibiotics narrowed to cefazolin based on her IND culture results, -Flagyl was added by ID to cover for anaerobics. -She will need 6 weeks of antibiotics for osteomyelitis-this duration will be changed if vascular surgery take her for toe amputation Monday, 07/01/2019 Dr. Sharol Given was consulted by vascular surgery-he will reevaluate her on Monday morning for a possible fifth metatarsal amputation and debridement. -Continue aspirin and Plavix for 7-month and follow-up with vascular surgery. -PT/OT consult for home health needs. -Supportive pain control  Type 2 diabetes.     Uncontrolled with A1c of 10. -Continue Lantus  35 units at bedtime. -Continue moderate SSI.  History of CVA.   No new neurologic deficit. S/P endovascular revascularization of left vertebral artery stenosis. -Continue aspirin and statin. -Continue Plavix.  Seizure disorder.  No recent seizure-like activity. -Continue Keppra  Bipolar with depression.  -Continue home dose of Cymbalta. -Continue risperidone.  Hypertension.  Currently normotensive. -Continue lisinopril.  Objective: Vitals:   06/28/19 0952 06/28/19 1544 06/28/19 2101 06/29/19 0644  BP: (!) 150/102 135/72 139/86 106/71  Pulse:  98 (!) 108 (!) 106  Resp:  18  18  Temp:  98.4 F (36.9 C) 98.1 F (36.7 C) 98 F (36.7 C)  TempSrc:  Oral Oral Oral  SpO2:  97% 98% 93%  Weight:    90.5 kg  Height:        Intake/Output Summary (Last 24 hours) at 06/29/2019 1317 Last data filed at 06/29/2019 0847 Gross per 24 hour  Intake 2860 ml  Output 1200 ml  Net 1660 ml   Filed Weights   06/27/19 0636 06/28/19 0542 06/29/19 0644  Weight: 96.6 kg 93.9 kg 90.5 kg    Examination:  General exam: NAD Respiratory system: Clear to auscultation.  Cardiovascular system: RRR. No JVD, murmurs, rubs, gallops or clicks. Gastrointestinal system: Soft, nontender, nondistended, bowel sounds positive. Central nervous system: Alert and oriented.  No acute focal deficit  Extremities: Left foot with dressing noted      DVT prophylaxis: Lovenox Code Status: Full Family Communication: No family at bedside Disposition Plan: Pending improvement.  Consultants:   Vascular surgery  ID  Orthopedic  Procedures:  Angiography and PCI of left femoral and popliteal arteries.  Antimicrobials:  Cefazolin Flagyl  Data Reviewed: I have personally reviewed following  labs and imaging studies  CBC: Recent Labs  Lab 06/23/19 1911 06/24/19 0113 06/25/19 0214 06/26/19 0957 06/28/19 1018  WBC 9.5 11.1* 9.2 10.3 9.6  NEUTROABS 7.1   --   --  8.1*  --   HGB 8.7* 9.1* 8.6* 7.8* 8.9*  HCT 30.3* 32.0* 30.1* 26.8* 31.5*  MCV 78.3* 79.2* 78.0* 77.7* 79.3*  PLT 388 390 353 376 123XX123*   Basic Metabolic Panel: Recent Labs  Lab 06/23/19 1911 06/24/19 0113 06/25/19 0214 06/26/19 0957  NA 139 139 140 137  K 3.2* 3.4* 3.9 3.8  CL 100 98 103 101  CO2 27 29 27 28   GLUCOSE 267* 310* 231* 368*  BUN 11 9 10 13   CREATININE 0.75 0.77 0.74 0.82  CALCIUM 8.5* 8.4* 8.6* 8.2*  MG  --  1.4* 2.2  --    GFR: Estimated Creatinine Clearance: 100.3 mL/min (by C-G formula based on SCr of 0.82 mg/dL). Liver Function Tests: Recent Labs  Lab 06/26/19 0957  AST 14*  ALT 14  ALKPHOS 73  BILITOT <0.1*  PROT 5.2*  ALBUMIN 2.2*   No results for input(s): LIPASE, AMYLASE in the last 168 hours. No results for input(s): AMMONIA in the last 168 hours. Coagulation Profile: Recent Labs  Lab 06/24/19 0113  INR 1.0   Cardiac Enzymes: No results for input(s): CKTOTAL, CKMB, CKMBINDEX, TROPONINI in the last 168 hours. BNP (last 3 results) No results for input(s): PROBNP in the last 8760 hours. HbA1C: No results for input(s): HGBA1C in the last 72 hours. CBG: Recent Labs  Lab 06/28/19 1150 06/28/19 1713 06/28/19 2102 06/29/19 0730 06/29/19 1135  GLUCAP 164* 175* 249* 181* 233*   Lipid Profile: No results for input(s): CHOL, HDL, LDLCALC, TRIG, CHOLHDL, LDLDIRECT in the last 72 hours. Thyroid Function Tests: No results for input(s): TSH, T4TOTAL, FREET4, T3FREE, THYROIDAB in the last 72 hours. Anemia Panel: No results for input(s): VITAMINB12, FOLATE, FERRITIN, TIBC, IRON, RETICCTPCT in the last 72 hours. Sepsis Labs: No results for input(s): PROCALCITON, LATICACIDVEN in the last 168 hours.  Recent Results (from the past 240 hour(s))  Blood Cultures x 2 sites     Status: None (Preliminary result)   Collection Time: 06/23/19  7:01 PM   Specimen: BLOOD LEFT ARM  Result Value Ref Range Status   Specimen Description BLOOD LEFT  ARM  Final   Special Requests   Final    BOTTLES DRAWN AEROBIC ONLY Blood Culture results may not be optimal due to an inadequate volume of blood received in culture bottles   Culture   Final    NO GROWTH 4 DAYS Performed at [facility], [place] [address], [place], [place] [address]    Report Status PENDING  Incomplete  Blood Cultures x 2 sites     Status: None (Preliminary result)   Collection Time: 06/23/19  7:11 PM   Specimen: BLOOD RIGHT ARM  Result Value Ref Range Status   Specimen Description BLOOD RIGHT ARM  Final   Special Requests   Final    BOTTLES DRAWN AEROBIC ONLY Blood Culture results may not be optimal due to an inadequate volume of blood received in culture bottles   Culture   Final    NO GROWTH 4 DAYS Performed at [facility], [place] [address], [place], [place] [address]    Report Status PENDING  Incomplete     Radiology Studies: No results found.  Scheduled Meds: . aspirin EC  81 mg Oral Daily  .  atorvastatin  40 mg Oral q1800  . canagliflozin  100 mg Oral QAC breakfast  . Chlorhexidine Gluconate Cloth  6 each Topical Daily  . clopidogrel  75 mg Oral Q breakfast  . diazepam  5 mg Oral BID  . DULoxetine  30 mg Oral Daily  . enoxaparin (LOVENOX) injection  40 mg Subcutaneous Daily  . gabapentin  900 mg Oral TID  . insulin aspart  0-15 Units Subcutaneous TID WC  . insulin aspart  0-5 Units Subcutaneous QHS  . insulin detemir  30 Units Subcutaneous QHS  . levETIRAcetam  500 mg Oral BID  . lisinopril  20 mg Oral Daily  . metroNIDAZOLE  500 mg Oral Q8H  . multivitamin with minerals  1 tablet Oral Daily  . nutrition supplement (JUVEN)  1 packet Oral BID BM  . nystatin cream   Topical BID  . pantoprazole  40 mg Oral Daily  . polyethylene glycol  17 g Oral Daily  . potassium chloride  40 mEq Oral BID WC  . Ensure Max Protein  11 oz Oral QHS  . risperiDONE  0.5 mg Oral QHS  . sodium chloride flush  10-40 mL Intracatheter Q12H  . sodium chloride flush   3 mL Intravenous Q12H  . traZODone  200 mg Oral QHS   Continuous Infusions: . sodium chloride 100 mL/hr at 06/27/19 2036  . sodium chloride    .  ceFAZolin (ANCEF) IV 2 g (06/29/19 0738)     LOS: 6 days   Time spent: 25 minutes.  Benito Mccreedy, MD [facility] Pager [phone]  If 7PM-7AM, please contact night-coverage [email or web address] Password [facility] 06/29/2019, 1:17 PM   This record has been created using Systems analyst. Errors have been sought and corrected,but may not always be located. Such creation errors do not reflect on the standard of care.

## 2019-06-29 NOTE — Progress Notes (Signed)
PHARMACY CONSULT NOTE FOR:  OUTPATIENT  PARENTERAL ANTIBIOTIC THERAPY (OPAT)  Indication: Osteomyelitis of left foot with MSSA/Group B strep Regimen: Cefazolin 2 gm IV Q 8 hours + Metronidazole 500 mg PO Q 8 hours  End date: 08/13/2019 (6 weeks from Day one post-op. Day one = 1/19  IV antibiotic discharge orders are pended. To discharging provider:  please sign these orders via discharge navigator,  Select New Orders & click on the button choice - Manage This Unsigned Work.     Thank you for allowing pharmacy to be a part of this patient's care.   Eddie Candle, PharmD PGY-1 Pharmacy Resident   Please check amion for clinical pharmacist contact number

## 2019-06-29 NOTE — Progress Notes (Addendum)
      INFECTIOUS DISEASE ATTENDING ADDENDUM:   Date: 06/29/2019  Patient name: Taylor [name]  Medical record number: [id number]  Date of birth: Nov 25, 1980   Greatly appreciate Dr. Jess Barters involvement and understanding is planning for lateral ray amputations on Monday with possible skin graft placement after gangrenous tissue was removed.  I would therefore recommend giving this patient 6 weeks of IV cefazolin along with oral metronidazole with start date date being Tuesday.  Diagnosis: Diabetic foot infection with osteomyelitis  Culture Result: MSSA  Allergies  Allergen Reactions  . Contrast Media [Iodinated Diagnostic Agents] Other (See Comments)    Went into kidney failure   . Amitriptyline Other (See Comments)    Unresponsive   . Compazine Other (See Comments)    Alert but unable to move  . Morphine And Related Other (See Comments)    Chest pain  . Oxycodone Nausea And Vomiting    Severe n/v -  patient will not take oxycodone or percocet.  . Prochlorperazine Other (See Comments)    Alert but unable to move  . Promethazine Swelling    Lips swell  . Sumatriptan Rash    OPAT Orders Discharge antibiotics:  Cefazolin IV and oral metronidazole   Duration:  6 weeks  End Date:  August 11, 2019  [facility] Care Per Protocol:    Labs  weekly while on IV antibiotics: _x_ CBC with differential _x_ BMP w GFR/CMP _x_ CRP _x_ ESR    _x_ Please pull PIC at completion of IV antibiotics __ Please leave PIC in place until doctor has seen patient or been notified  Fax weekly labs to [phone]  Clinic Follow Up Appt:   Taylor [name] has an appointment on July 30, 2019 at 9:30 AM with Dr. Megan Salon at the  [facility] for Infectious Disease is located in the [facility] at  [address] in [place].  [address], which is located to the left of the elevators.  Phone: [phone]  Fax: [phone]  [email or web address]  She should arrive least 15 minutes prior to her appointment.   I will sign off for now please call with further questions.     Alcide Evener 06/29/2019, 10:27 AM

## 2019-06-30 LAB — CULTURE, BLOOD (ROUTINE X 2)
Culture: NO GROWTH
Culture: NO GROWTH

## 2019-06-30 LAB — GLUCOSE, CAPILLARY
Glucose-Capillary: 292 mg/dL — ABNORMAL HIGH (ref 70–99)
Glucose-Capillary: 306 mg/dL — ABNORMAL HIGH (ref 70–99)
Glucose-Capillary: 147 mg/dL — ABNORMAL HIGH (ref 70–99)
Glucose-Capillary: 139 mg/dL — ABNORMAL HIGH (ref 70–99)

## 2019-06-30 MED ORDER — METOPROLOL SUCCINATE ER 25 MG PO TB24
25.0000 mg | ORAL_TABLET | Freq: Every day | ORAL | Status: DC
  Administered 2019-06-30 – 2019-07-05 (×6): 25 mg via ORAL
  Filled 2019-06-30 (×6): qty 1

## 2019-06-30 MED ORDER — INSULIN DETEMIR 100 UNIT/ML ~~LOC~~ SOLN
40.0000 [IU] | Freq: Every day | SUBCUTANEOUS | Status: DC
  Administered 2019-06-30 – 2019-07-01 (×2): 40 [IU] via SUBCUTANEOUS
  Filled 2019-06-30 (×3): qty 0.4

## 2019-06-30 NOTE — Consult Note (Signed)
[facility] Nurse Consult Note: Reason for Consult:Left foot ulcers Wound type: Infectious  Consult to [place] is placed simultaneously to consult to Orthopedic Surgeon (Dr. Sharol Given).  Dr. Sharol Given saw on Friday afternoon and plan is to proceed with an operative intervention on Monday. Dr Sharol Given to establish POC following procedure.  [facility] nursing team will not follow, but will remain available to this patient, the nursing and medical teams.  Please re-consult if needed. Thanks, Maudie Flakes, MSN, RN, [place], Arther Abbott  Pager# [phone]

## 2019-06-30 NOTE — Plan of Care (Signed)
  Problem: Nutrition: Goal: Adequate nutrition will be maintained Outcome: Progressing   Problem: Coping: Goal: Level of anxiety will decrease Outcome: Not Progressing   Problem: Pain Managment: Goal: General experience of comfort will improve Outcome: Not Progressing   

## 2019-06-30 NOTE — Progress Notes (Signed)
PROGRESS NOTE    Taylor [name]  [id number] DOB: [birth year] DOA: 06/23/2019 PCP: Monico Blitz, MD   Brief Narrative:  Taylor [name] is a 39 y.o. female with medical history significant for type 2 diabetes, previous stroke, seizures, hypertension, and hyperlipidemia.  Patient was hospitalized at [facility] facility from 06/12/2019 to 06/22/2018 for left foot infection with cellulitis and abscess complicated by left fifth toe osteomyelitis, necrotic ulcer.  I&D was performed and culture grew pan MSSA.  She did had abnormal [name] and she was transferred to [facility] for vascular surgery evaluation.  Initially treated with cefepime and vancomycin Antibiotics narrowed to cefazolin based on culture results, Flagyl was added by ID for anaerobic coverage.  Subjective: No fever or chills.  Planned for possible amputation tomorrow  Assessment & Plan:   Principal Problem:   Osteomyelitis of left foot (HCC) Active Problems:   Diabetic foot infection (HCC)   PAD (peripheral artery disease) (HCC)  Left foot infection and osteomyelitis of left fifth toe and necrosis: Peripheral arterial disease. [name] shows bilateral lower extremity peripheral arterial disease. Vascular surgery was consulted and patient was taken for angiography/angioplasty, drug-coated balloon angioplasty was done on left superficial femoral and popliteal artery with good response. -Antibiotics narrowed to cefazolin based on her IND culture results, -Flagyl was added by ID to cover for anaerobics. -She will need 6 weeks of antibiotics for osteomyelitis-this duration will be changed if vascular surgery take her for toe amputation Monday, 07/01/2019 Dr. Sharol Given was consulted by vascular surgery-he will reevaluate her on Monday morning for a possible fifth metatarsal amputation and debridement. -Continue aspirin and Plavix for 16-month and follow-up with vascular surgery. -PT/OT consult for home health needs. -Supportive pain control  Type 2 diabetes.   Uncontrolled with A1c of 10. -Continue Lantus  35 units at bedtime. -Continue moderate SSI.  History of CVA.   No new neurologic deficit. S/P endovascular revascularization of left vertebral artery stenosis. -Continue aspirin and statin. -Continue Plavix.  Seizure disorder.  No recent seizure-like activity. -Continue Keppra  Bipolar with depression.  -Continue home dose of Cymbalta. -Continue risperidone.  Hypertension.  Currently normotensive. -Continue lisinopril.  Objective: Vitals:   06/29/19 2122 06/29/19 2144 06/30/19 0640 06/30/19 1233  BP: 94/61 124/87 105/75 94/66  Pulse: (!) 111  (!) 123 (!) 105  Resp: 16   18  Temp: 97.8 F (36.6 C)  98.1 F (36.7 C) 98.9 F (37.2 C)  TempSrc: Oral  Oral Oral  SpO2: 97%  96% 100%  Weight:      Height:        Intake/Output Summary (Last 24 hours) at 06/30/2019 1303 Last data filed at 06/30/2019 0834 Gross per 24 hour  Intake 2248.17 ml  Output 1150 ml  Net 1098.17 ml   Filed Weights   06/27/19 0636 06/28/19 0542 06/29/19 0644  Weight: 96.6 kg 93.9 kg 90.5 kg    Examination:  General exam: NAD Respiratory system: Clear to auscultation.  Cardiovascular system: RRR. No JVD, murmurs, rubs, gallops or clicks. Gastrointestinal system: Soft, nontender, nondistended, bowel sounds positive. Central nervous system: Alert and oriented.  No acute focal deficit  Extremities: Left foot with dressing noted      DVT prophylaxis: Lovenox Code Status: Full Family Communication: No family at bedside Disposition Plan: Pending improvement.  Consultants:   Vascular surgery  ID  Orthopedic  Procedures:  Angiography and PCI of left femoral and popliteal arteries.  Antimicrobials:  Cefazolin Flagyl  Data Reviewed: I have personally reviewed  following labs and imaging studies  CBC: Recent Labs  Lab 06/23/19 1911 06/24/19 0113 06/25/19 0214 06/26/19 0957 06/28/19 1018  WBC 9.5 11.1* 9.2 10.3  9.6  NEUTROABS 7.1  --   --  8.1*  --   HGB 8.7* 9.1* 8.6* 7.8* 8.9*  HCT 30.3* 32.0* 30.1* 26.8* 31.5*  MCV 78.3* 79.2* 78.0* 77.7* 79.3*  PLT 388 390 353 376 123XX123*   Basic Metabolic Panel: Recent Labs  Lab 06/23/19 1911 06/24/19 0113 06/25/19 0214 06/26/19 0957  NA 139 139 140 137  K 3.2* 3.4* 3.9 3.8  CL 100 98 103 101  CO2 27 29 27 28   GLUCOSE 267* 310* 231* 368*  BUN 11 9 10 13   CREATININE 0.75 0.77 0.74 0.82  CALCIUM 8.5* 8.4* 8.6* 8.2*  MG  --  1.4* 2.2  --    GFR: Estimated Creatinine Clearance: 100.3 mL/min (by C-G formula based on SCr of 0.82 mg/dL). Liver Function Tests: Recent Labs  Lab 06/26/19 0957  AST 14*  ALT 14  ALKPHOS 73  BILITOT <0.1*  PROT 5.2*  ALBUMIN 2.2*   No results for input(s): LIPASE, AMYLASE in the last 168 hours. No results for input(s): AMMONIA in the last 168 hours. Coagulation Profile: Recent Labs  Lab 06/24/19 0113  INR 1.0   Cardiac Enzymes: No results for input(s): CKTOTAL, CKMB, CKMBINDEX, TROPONINI in the last 168 hours. BNP (last 3 results) No results for input(s): PROBNP in the last 8760 hours. HbA1C: No results for input(s): HGBA1C in the last 72 hours. CBG: Recent Labs  Lab 06/29/19 1135 06/29/19 1610 06/29/19 2124 06/30/19 0750 06/30/19 1136  GLUCAP 233* 212* 206* 292* 306*   Lipid Profile: No results for input(s): CHOL, HDL, LDLCALC, TRIG, CHOLHDL, LDLDIRECT in the last 72 hours. Thyroid Function Tests: No results for input(s): TSH, T4TOTAL, FREET4, T3FREE, THYROIDAB in the last 72 hours. Anemia Panel: No results for input(s): VITAMINB12, FOLATE, FERRITIN, TIBC, IRON, RETICCTPCT in the last 72 hours. Sepsis Labs: No results for input(s): PROCALCITON, LATICACIDVEN in the last 168 hours.  Recent Results (from the past 240 hour(s))  Blood Cultures x 2 sites     Status: None (Preliminary result)   Collection Time: 06/23/19  7:01 PM   Specimen: BLOOD LEFT ARM  Result Value Ref Range Status   Specimen  Description BLOOD LEFT ARM  Final   Special Requests   Final    BOTTLES DRAWN AEROBIC ONLY Blood Culture results may not be optimal due to an inadequate volume of blood received in culture bottles   Culture   Final    NO GROWTH 4 DAYS Performed at [facility], [place] [address] [place], [place] [address]    Report Status PENDING  Incomplete  Blood Cultures x 2 sites     Status: None (Preliminary result)   Collection Time: 06/23/19  7:11 PM   Specimen: BLOOD RIGHT ARM  Result Value Ref Range Status   Specimen Description BLOOD RIGHT ARM  Final   Special Requests   Final    BOTTLES DRAWN AEROBIC ONLY Blood Culture results may not be optimal due to an inadequate volume of blood received in culture bottles   Culture   Final    NO GROWTH 4 DAYS Performed at [facility], [place] [address], [place], [place] [address]    Report Status PENDING  Incomplete     Radiology Studies: No results found.  Scheduled Meds: . aspirin EC  81 mg Oral Daily  .  atorvastatin  40 mg Oral q1800  . canagliflozin  100 mg Oral QAC breakfast  . Chlorhexidine Gluconate Cloth  6 each Topical Daily  . clopidogrel  75 mg Oral Q breakfast  . diazepam  5 mg Oral BID  . DULoxetine  30 mg Oral Daily  . enoxaparin (LOVENOX) injection  40 mg Subcutaneous Daily  . gabapentin  900 mg Oral TID  . insulin aspart  0-15 Units Subcutaneous TID WC  . insulin aspart  0-5 Units Subcutaneous QHS  . insulin detemir  30 Units Subcutaneous QHS  . levETIRAcetam  500 mg Oral BID  . lisinopril  20 mg Oral Daily  . metroNIDAZOLE  500 mg Oral Q8H  . multivitamin with minerals  1 tablet Oral Daily  . nutrition supplement (JUVEN)  1 packet Oral BID BM  . nystatin cream   Topical BID  . pantoprazole  40 mg Oral Daily  . polyethylene glycol  17 g Oral Daily  . potassium chloride  40 mEq Oral BID WC  . Ensure Max Protein  11 oz Oral QHS  . risperiDONE  0.5 mg Oral QHS  . sodium chloride flush  10-40 mL Intracatheter Q12H   . sodium chloride flush  3 mL Intravenous Q12H  . traZODone  200 mg Oral QHS   Continuous Infusions: . sodium chloride 100 mL/hr at 06/27/19 2036  . sodium chloride    .  ceFAZolin (ANCEF) IV 2 g (06/30/19 0659)     LOS: 7 days   .  Benito Mccreedy, MD [facility] Pager [phone]  If 7PM-7AM, please contact night-coverage [email or web address] Password [facility] 06/30/2019, 1:03 PM   This record has been created using Systems analyst. Errors have been sought and corrected,but may not always be located. Such creation errors do not reflect on the standard of care.

## 2019-07-01 ENCOUNTER — Other Ambulatory Visit: Payer: Self-pay | Admitting: Physician Assistant

## 2019-07-01 DIAGNOSIS — R52 Pain, unspecified: Secondary | ICD-10-CM

## 2019-07-01 DIAGNOSIS — I959 Hypotension, unspecified: Secondary | ICD-10-CM

## 2019-07-01 LAB — COMPREHENSIVE METABOLIC PANEL
ALT: 14 U/L (ref 0–44)
AST: 27 U/L (ref 15–41)
Albumin: 2.9 g/dL — ABNORMAL LOW (ref 3.5–5.0)
Alkaline Phosphatase: 74 U/L (ref 38–126)
Anion gap: 11 (ref 5–15)
BUN: 15 mg/dL (ref 6–20)
CO2: 27 mmol/L (ref 22–32)
Calcium: 9.3 mg/dL (ref 8.9–10.3)
Chloride: 102 mmol/L (ref 98–111)
Creatinine, Ser: 0.94 mg/dL (ref 0.44–1.00)
GFR calc Af Amer: >60 mL/min (ref >60)
GFR calc non Af Amer: >60 mL/min (ref >60)
Glucose, Bld: 161 mg/dL — ABNORMAL HIGH (ref 70–99)
Potassium: 4.8 mmol/L (ref 3.5–5.1)
Sodium: 140 mmol/L (ref 135–145)
Total Bilirubin: 0.5 mg/dL (ref 0.3–1.2)
Total Protein: 6.5 g/dL (ref 6.5–8.1)

## 2019-07-01 LAB — GLUCOSE, CAPILLARY
Glucose-Capillary: 182 mg/dL — ABNORMAL HIGH (ref 70–99)
Glucose-Capillary: 269 mg/dL — ABNORMAL HIGH (ref 70–99)
Glucose-Capillary: 210 mg/dL — ABNORMAL HIGH (ref 70–99)
Glucose-Capillary: 164 mg/dL — ABNORMAL HIGH (ref 70–99)

## 2019-07-01 LAB — CBC
HCT: 32.6 % — ABNORMAL LOW (ref 36.0–46.0)
Hemoglobin: 9.2 g/dL — ABNORMAL LOW (ref 12.0–15.0)
MCH: 22.1 pg — ABNORMAL LOW (ref 26.0–34.0)
MCHC: 28.2 g/dL — ABNORMAL LOW (ref 30.0–36.0)
MCV: 78.2 fL — ABNORMAL LOW (ref 80.0–100.0)
Platelets: 408 10*3/uL — ABNORMAL HIGH (ref 150–400)
RBC: 4.17 MIL/uL (ref 3.87–5.11)
RDW: 16.6 % — ABNORMAL HIGH (ref 11.5–15.5)
WBC: 8.4 10*3/uL (ref 4.0–10.5)
nRBC: 0 % (ref 0.0–0.2)

## 2019-07-01 MED ORDER — SODIUM CHLORIDE 0.9 % IV BOLUS
500.0000 mL | Freq: Once | INTRAVENOUS | Status: AC
  Administered 2019-07-01: 500 mL via INTRAVENOUS

## 2019-07-01 NOTE — Progress Notes (Signed)
Met with patient and her mother at bedside. They state that have opted for amputation of 4th and 5th toes with skin graft L foot on 07/03/19 with Dr Sharol Given.  Post op goal is to return home under mother's care once cleared for discharge. She voiced that patient will probably need a rolling walker with a seat due to her weakness and fatigue once she goes home. Discussed that patient will mostly likely require orthotic shoe and will be non-weight bearing after surgery and will need to follow up with Sharol Given in his office 1 week post op. Verbalized understanding. No additional needs or barriers at this time.

## 2019-07-01 NOTE — H&P (View-Only) (Signed)
Patient ID: Taylor [name], female   DOB: March 21, 1981, 39 y.o.   MRN: [id number] Patient is seen in follow-up for gangrenous necrotic ulcers lateral aspect of the left foot status post revascularization.  Examination the area of gangrene involves primarily the little and fourth toe there is some mild ischemic ulceration which actually looks better on the dorsum of her foot.  Feels a patient has demarcated her area of necrosis and that a third and fourth ray amputation with debridement and application of skin graft is the best option.  Patient states she understands wished to proceed at this time we will plan for surgery on Wednesday.

## 2019-07-01 NOTE — Progress Notes (Signed)
PROGRESS NOTE    Taylor [name]  [id number]  DOB: [birth year]  PCP: Monico Blitz, MD Admit date:06/23/2019 Hospital course: 39 y.o.femalewith medical history significantfor type 2 diabetes, previous stroke, seizures, hypertension, and hyperlipidemia.Patient was hospitalized at [facility] facility from 06/12/2019 to1/10/2020forleft foot infection with cellulitis and abscess complicated by left fifth toe osteomyelitis, necrotic ulcer.  I&D was performed and culture grew pan MSSA.  She did have abnormal [name] and she was transferred to [facility] for vascular surgery evaluation.  Initially treated with cefepime and vancomycin. Antibiotics narrowed to cefazolin based on culture results, Flagyl was added by ID for anaerobic coverage.  Subjective: Patient complaining of persistent pain in her toes and requesting hydrocodone increased dosing of frequency.  Noted to be on hydrocodone 2 tabs every 4 hours as needed along with Dilaudid 0.5 mg IV every 4 hours as needed for breakthrough pain.  Patient prefers hydrocodone over Dilaudid.  She does not want to try long-acting oxycodone.  Mother who is at bedside states patient has severe memory issues.  Otherwise patient appears to be overall comfortable.  Rates her pain level currently at 4/10.  Objective: Vitals:   06/30/19 1233 06/30/19 2035 07/01/19 0455 07/01/19 0849  BP: 94/66 106/77 117/71 105/73  Pulse: (!) 105 96 95 (!) 107  Resp: 18     Temp: 98.9 F (37.2 C) 97.7 F (36.5 C) 97.7 F (36.5 C)   TempSrc: Oral Oral Oral   SpO2: 100% 97% 97%   Weight:   91 kg   Height:        Intake/Output Summary (Last 24 hours) at 07/01/2019 1014 Last data filed at 07/01/2019 0455 Gross per 24 hour  Intake 360 ml  Output 950 ml  Net -590 ml   Filed Weights   06/28/19 0542 06/29/19 0644 07/01/19 0455  Weight: 93.9 kg 90.5 kg 91 kg    Physical Examination:  General exam: Appears calm and comfortable  Respiratory system: Clear to  auscultation. Respiratory effort normal. Cardiovascular system: S1 & S2 heard, RRR. No JVD, murmurs. No pedal edema. Gastrointestinal system: Abdomen is nondistended, soft and nontender. Normal bowel sounds heard. Central nervous system: Alert and oriented. No new focal neurological deficits. Extremities: Gangrenous changes noted on left fourth and fifth toes, local dressing along the left foot Skin: No rashes, lesions or ulcers Psychiatry: Judgement and insight appear somewhat impaired, mood & affect appropriate.   Data Reviewed: I have personally reviewed following labs and imaging studies  CBC: Recent Labs  Lab 06/25/19 0214 06/26/19 0957 06/28/19 1018 07/01/19 0538  WBC 9.2 10.3 9.6 8.4  NEUTROABS  --  8.1*  --   --   HGB 8.6* 7.8* 8.9* 9.2*  HCT 30.1* 26.8* 31.5* 32.6*  MCV 78.0* 77.7* 79.3* 78.2*  PLT 353 376 413* 123XX123*   Basic Metabolic Panel: Recent Labs  Lab 06/25/19 0214 06/26/19 0957 07/01/19 0538  NA 140 137 140  K 3.9 3.8 4.8  CL 103 101 102  CO2 27 28 27   GLUCOSE 231* 368* 161*  BUN 10 13 15   CREATININE 0.74 0.82 0.94  CALCIUM 8.6* 8.2* 9.3  MG 2.2  --   --    GFR: Estimated Creatinine Clearance: 87.8 mL/min (by C-G formula based on SCr of 0.94 mg/dL). Liver Function Tests: Recent Labs  Lab 06/26/19 0957 07/01/19 0538  AST 14* 27  ALT 14 14  ALKPHOS 73 74  BILITOT <0.1* 0.5  PROT 5.2* 6.5  ALBUMIN 2.2* 2.9*   No  results for input(s): LIPASE, AMYLASE in the last 168 hours. No results for input(s): AMMONIA in the last 168 hours. Coagulation Profile: No results for input(s): INR, PROTIME in the last 168 hours. Cardiac Enzymes: No results for input(s): CKTOTAL, CKMB, CKMBINDEX, TROPONINI in the last 168 hours. BNP (last 3 results) No results for input(s): PROBNP in the last 8760 hours. HbA1C: No results for input(s): HGBA1C in the last 72 hours. CBG: Recent Labs  Lab 06/30/19 0750 06/30/19 1136 06/30/19 1705 06/30/19 2124 07/01/19 0731    GLUCAP 292* 306* 147* 139* 182*   Lipid Profile: No results for input(s): CHOL, HDL, LDLCALC, TRIG, CHOLHDL, LDLDIRECT in the last 72 hours. Thyroid Function Tests: No results for input(s): TSH, T4TOTAL, FREET4, T3FREE, THYROIDAB in the last 72 hours. Anemia Panel: No results for input(s): VITAMINB12, FOLATE, FERRITIN, TIBC, IRON, RETICCTPCT in the last 72 hours. Sepsis Labs: No results for input(s): PROCALCITON, LATICACIDVEN in the last 168 hours.  Recent Results (from the past 240 hour(s))  Blood Cultures x 2 sites     Status: None   Collection Time: 06/23/19  7:01 PM   Specimen: BLOOD LEFT ARM  Result Value Ref Range Status   Specimen Description BLOOD LEFT ARM  Final   Special Requests   Final    BOTTLES DRAWN AEROBIC ONLY Blood Culture results may not be optimal due to an inadequate volume of blood received in culture bottles Performed at [facility], [place] [address] [place], [place] [address]    Culture NO GROWTH 7 DAYS  Final   Report Status 06/30/2019 FINAL  Final  Blood Cultures x 2 sites     Status: None   Collection Time: 06/23/19  7:11 PM   Specimen: BLOOD RIGHT ARM  Result Value Ref Range Status   Specimen Description BLOOD RIGHT ARM  Final   Special Requests   Final    BOTTLES DRAWN AEROBIC ONLY Blood Culture results may not be optimal due to an inadequate volume of blood received in culture bottles Performed at [facility], [address] [place], [place] [address]    Culture NO GROWTH 7 DAYS  Final   Report Status 06/30/2019 FINAL  Final      Radiology Studies: No results found.      Scheduled Meds: . aspirin EC  81 mg Oral Daily  . atorvastatin  40 mg Oral q1800  . canagliflozin  100 mg Oral QAC breakfast  . Chlorhexidine Gluconate Cloth  6 each Topical Daily  . clopidogrel  75 mg Oral Q breakfast  . diazepam  5 mg Oral BID  . DULoxetine  30 mg Oral Daily  . enoxaparin (LOVENOX) injection  40 mg Subcutaneous Daily  . gabapentin   900 mg Oral TID  . insulin aspart  0-15 Units Subcutaneous TID WC  . insulin aspart  0-5 Units Subcutaneous QHS  . insulin detemir  40 Units Subcutaneous QHS  . levETIRAcetam  500 mg Oral BID  . lisinopril  20 mg Oral Daily  . metoprolol succinate  25 mg Oral Daily  . metroNIDAZOLE  500 mg Oral Q8H  . multivitamin with minerals  1 tablet Oral Daily  . nutrition supplement (JUVEN)  1 packet Oral BID BM  . nystatin cream   Topical BID  . pantoprazole  40 mg Oral Daily  . polyethylene glycol  17 g Oral Daily  . potassium chloride  40 mEq Oral BID WC  . Ensure Max Protein  11 oz  Oral QHS  . risperiDONE  0.5 mg Oral QHS  . sodium chloride flush  10-40 mL Intracatheter Q12H  . sodium chloride flush  3 mL Intravenous Q12H  . traZODone  200 mg Oral QHS   Continuous Infusions: . sodium chloride 100 mL/hr at 06/27/19 2036  . sodium chloride    .  ceFAZolin (ANCEF) IV 2 g (07/01/19 [id number])    Assessment & Plan:   PAD with gangrenous necrotic ulcers along the lateral aspect of  left foot and left fourth/fifth toe: Patient seen by vascular Sx, is now status post revascularization after [name] showed bilateral lower extremity PAD-drug-coated balloon angioplasty was done on left superficial femoral and popliteal artery with good response.Seen by Ortho for f/u regarding the area of gangrene involving the fourth and 5th toe with mild ischemic ulceration. Per Dr Ancil Boozer has demarcated her area of necrosis and a third/ fourth ray amputation with debridement and application of skin graft is the best option, plan for surgery on Wednesday.  Antibiotics narrowed to cefazolin based on her I&D culture results,Flagyl was added by ID to cover for anaerobics. -She will need 6 weeks of antibiotics for osteomyelitis-this duration may be changed after toe amputation, which is planned to be performed on 07/03/2019.Continue aspirin and Plavix for 64-month and follow-up with vascular surgery.PT/OT consult for home health  needs.Supportive pain control  Type 2 diabetes.  Uncontrolled with A1c of 10.Continue Lantus  35 units at bedtime. -Continue moderate SSI.  History of CVA.  No new neurologic deficit.S/P endovascular revascularization of left vertebral artery stenosis.Continue aspirin/Plavix and statin.  Seizure disorder.  No recent seizure-like activity.Continue Keppra  Bipolar with depression. Continue home dose of Cymbalta,risperidone.  Hypertension.  Currently normotensive.Continue lisinopril.  Pain management: Patient does not want long-acting oxycodone, prefers to keep current dose of Norco.  Has Dilaudid 0.5 mg as needed available.  Her blood pressure did drop to systolic 123XX123 this afternoon.  Give IV fluid bolus.  If recurs, may need to reduce hydrocodone dosing.     DVT prophylaxis: Lovenox Code Status: Full code Family / Patient Communication: Discussed with patient and mother at bedside Disposition Plan: Home after surgical intervention and when medically cleared     LOS: 8 days    Time spent: 15 minutes    Guilford Shi, MD [facility] Pager [phone]  If 7PM-7AM, please contact night-coverage [email or web address] Password [email or web address] 07/01/2019, 10:14 AM

## 2019-07-01 NOTE — Progress Notes (Signed)
Patient ID: Taylor [name], female   DOB: Oct 03, 1980, 39 y.o.   MRN: [id number] Patient is seen in follow-up for gangrenous necrotic ulcers lateral aspect of the left foot status post revascularization.  Examination the area of gangrene involves primarily the little and fourth toe there is some mild ischemic ulceration which actually looks better on the dorsum of her foot.  Feels a patient has demarcated her area of necrosis and that a third and fourth ray amputation with debridement and application of skin graft is the best option.  Patient states she understands wished to proceed at this time we will plan for surgery on Wednesday.

## 2019-07-01 NOTE — Progress Notes (Signed)
Nutrition Follow-up  RD working remotely.  DOCUMENTATION CODES:   Obesity unspecified  INTERVENTION:   -D/c Juven due to poor acceptance -Continue MVI with minerals daily -Double protein portions with meals  NUTRITION DIAGNOSIS:   Increased nutrient needs related to wound healing as evidenced by estimated needs.  Ongoing  GOAL:   Patient will meet greater than or equal to 90% of their needs  Progressing   MONITOR:   PO intake, Supplement acceptance, Labs, Weight trends, Skin, I & O's  REASON FOR ASSESSMENT:   Consult Wound healing  ASSESSMENT:   Taylor [name] is a 39 y.o. female with medical history significant for type 2 diabetes, previous stroke, seizures, hypertension, and hyperlipidemia.  1/2- s/p I&D at [facility] 1/12- s/p abdominal aortogram, atherectomy, and angioplasty 1/14- PICC placed  Reviewed I/O's: -100 ml x 24 hours and +8 L since admission  UOP: 950 ml x 24 hours   Per chart review, pt with short term memory loss and often does not remember what was discussed. Pt's mother is very supportive and assists pt with care at home.   Per orthopedics notes, area of gangrene on 4th and 5th toes reveals mild ischemic ulceration, which necrosis has been demarcated. Plan for third and fourth ray amputation with debridement and application of skin graft planned for Wednesday, 07/03/19.  Pt with great appetite. Noted meal completion documented at 100%. Pt is refusing Juven supplements.   Labs reviewed: CBGS: 139-306 (inpatient orders for glycemic control are100 mg canagliflozin daily before breakfast, 0-5 units insulin aspart q HS, and 40 units insulin detemir daily at bedtime).   Diet Order:   Diet Order            Diet heart healthy/carb modified Room service appropriate? Yes; Fluid consistency: Thin  Diet effective now              EDUCATION NEEDS:   No education needs have been identified at this time  Skin:  Skin Assessment: Skin Integrity  Issues: Skin Integrity Issues:: Diabetic Ulcer Diabetic Ulcer: lt heel and 3rd and 4th lt toes Other: MASD lt and rt buttocks  Last BM:  06/30/19  Height:   Ht Readings from Last 1 Encounters:  06/23/19 5\' 4"  (1.626 m)    Weight:   Wt Readings from Last 1 Encounters:  07/01/19 91 kg    Ideal Body Weight:  54.5 kg  BMI:  Body mass index is 34.44 kg/m.  Estimated Nutritional Needs:   Kcal:  1900-2100  Protein:  95-110 grams  Fluid:  > 1.9 L    Kailiana Granquist A. Jimmye Norman, RD, LDN, [place] Registered Dietitian II Certified Diabetes Care and Education Specialist Pager: [phone] After hours Pager: [phone]

## 2019-07-02 ENCOUNTER — Other Ambulatory Visit: Payer: Self-pay

## 2019-07-02 LAB — GLUCOSE, CAPILLARY
Glucose-Capillary: 231 mg/dL — ABNORMAL HIGH (ref 70–99)
Glucose-Capillary: 220 mg/dL — ABNORMAL HIGH (ref 70–99)
Glucose-Capillary: 144 mg/dL — ABNORMAL HIGH (ref 70–99)
Glucose-Capillary: 175 mg/dL — ABNORMAL HIGH (ref 70–99)

## 2019-07-02 MED ORDER — INSULIN DETEMIR 100 UNIT/ML ~~LOC~~ SOLN
20.0000 [IU] | Freq: Every day | SUBCUTANEOUS | Status: DC
  Administered 2019-07-02: 20 [IU] via SUBCUTANEOUS
  Filled 2019-07-02 (×2): qty 0.2

## 2019-07-02 MED ORDER — CHLORHEXIDINE GLUCONATE 4 % EX LIQD
60.0000 mL | Freq: Once | CUTANEOUS | Status: AC
  Administered 2019-07-03: 4 via TOPICAL
  Filled 2019-07-02: qty 60

## 2019-07-02 MED ORDER — PNEUMOCOCCAL VAC POLYVALENT 25 MCG/0.5ML IJ INJ
0.5000 mL | INJECTION | INTRAMUSCULAR | Status: AC | PRN
  Administered 2019-07-05: 0.5 mL via INTRAMUSCULAR
  Filled 2019-07-02: qty 0.5

## 2019-07-02 NOTE — Progress Notes (Addendum)
PROGRESS NOTE    Taylor [name]  [id number]  DOB: 1981-02-04  PCP: Monico Blitz, MD Admit date:06/23/2019 Hospital course: 39 y.o.femalewith medical history significantfor type 2 diabetes, previous stroke, seizures, hypertension, and hyperlipidemia.Patient was hospitalized at [facility] facility from 06/12/2019 to1/10/2020forleft foot infection with cellulitis and abscess complicated by left fifth toe osteomyelitis, necrotic ulcer.  I&D was performed and culture grew pan MSSA.  She did have abnormal [name] and she was transferred to [facility] for vascular surgery evaluation.  Initially treated with cefepime and vancomycin. Antibiotics narrowed to cefazolin based on culture results, Flagyl was added by ID for anaerobic coverage.Hospital course complicated by hypotension and uncontrolled toe pain, on hydrocodone 2 tabs every 4 hours as needed along with Dilaudid 0.5 mg IV every 4 hours as needed for breakthrough pain.  Patient prefers hydrocodone over Dilaudid.  She does not want to try long-acting oxycodone.  Subjective: Sleeping comfortably when I entered the room, patient however complaining of persistent pain in her toes and requesting not to change hydrocodone dose. Blood pressure better today. Remains on IV fluids. Patient states she has decided to quit smoking for good as she wants her wounds to heal well.  Objective: Vitals:   07/01/19 2132 07/02/19 0625 07/02/19 0755 07/02/19 1421  BP: 114/70 120/72 (!) 125/95 (!) 100/53  Pulse: 92 79  88  Resp:      Temp: 98.1 F (36.7 C) 98.2 F (36.8 C)  98 F (36.7 C)  TempSrc: Oral Oral  Oral  SpO2: 100%   99%  Weight:  91.8 kg    Height:        Intake/Output Summary (Last 24 hours) at 07/02/2019 1834 Last data filed at 07/02/2019 1601 Gross per 24 hour  Intake 3208.45 ml  Output --  Net 3208.45 ml   Filed Weights   06/29/19 0644 07/01/19 0455 07/02/19 0625  Weight: 90.5 kg 91 kg 91.8 kg    Physical Examination:  General  exam: Appears calm and comfortable  Respiratory system: Clear to auscultation. Respiratory effort normal. Cardiovascular system: S1 & S2 heard, RRR. No JVD, murmurs. No pedal edema. Gastrointestinal system: Abdomen is nondistended, soft and nontender. Normal bowel sounds heard. Central nervous system: Alert and oriented. No new focal neurological deficits. Extremities: Gangrenous changes noted on left fourth and fifth toes, local dressing along the left foot Skin: No rashes, lesions or ulcers Psychiatry: Judgement and insight appear somewhat impaired, mood & affect appropriate.   Data Reviewed: I have personally reviewed following labs and imaging studies  CBC: Recent Labs  Lab 06/26/19 0957 06/28/19 1018 07/01/19 0538  WBC 10.3 9.6 8.4  NEUTROABS 8.1*  --   --   HGB 7.8* 8.9* 9.2*  HCT 26.8* 31.5* 32.6*  MCV 77.7* 79.3* 78.2*  PLT 376 413* 123XX123*   Basic Metabolic Panel: Recent Labs  Lab 06/26/19 0957 07/01/19 0538  NA 137 140  K 3.8 4.8  CL 101 102  CO2 28 27  GLUCOSE 368* 161*  BUN 13 15  CREATININE 0.82 0.94  CALCIUM 8.2* 9.3   GFR: Estimated Creatinine Clearance: 88.2 mL/min (by C-G formula based on SCr of 0.94 mg/dL). Liver Function Tests: Recent Labs  Lab 06/26/19 0957 07/01/19 0538  AST 14* 27  ALT 14 14  ALKPHOS 73 74  BILITOT <0.1* 0.5  PROT 5.2* 6.5  ALBUMIN 2.2* 2.9*   No results for input(s): LIPASE, AMYLASE in the last 168 hours. No results for input(s): AMMONIA in the last 168 hours.  Coagulation Profile: No results for input(s): INR, PROTIME in the last 168 hours. Cardiac Enzymes: No results for input(s): CKTOTAL, CKMB, CKMBINDEX, TROPONINI in the last 168 hours. BNP (last 3 results) No results for input(s): PROBNP in the last 8760 hours. HbA1C: No results for input(s): HGBA1C in the last 72 hours. CBG: Recent Labs  Lab 07/01/19 1626 07/01/19 2129 07/02/19 0726 07/02/19 1138 07/02/19 1652  GLUCAP 210* 164* 231* 220* 144*   Lipid  Profile: No results for input(s): CHOL, HDL, LDLCALC, TRIG, CHOLHDL, LDLDIRECT in the last 72 hours. Thyroid Function Tests: No results for input(s): TSH, T4TOTAL, FREET4, T3FREE, THYROIDAB in the last 72 hours. Anemia Panel: No results for input(s): VITAMINB12, FOLATE, FERRITIN, TIBC, IRON, RETICCTPCT in the last 72 hours. Sepsis Labs: No results for input(s): PROCALCITON, LATICACIDVEN in the last 168 hours.  Recent Results (from the past 240 hour(s))  Blood Cultures x 2 sites     Status: None   Collection Time: 06/23/19  7:01 PM   Specimen: BLOOD LEFT ARM  Result Value Ref Range Status   Specimen Description BLOOD LEFT ARM  Final   Special Requests   Final    BOTTLES DRAWN AEROBIC ONLY Blood Culture results may not be optimal due to an inadequate volume of blood received in culture bottles Performed at [facility], [place] [address] [place], [place] [address]    Culture NO GROWTH 7 DAYS  Final   Report Status 06/30/2019 FINAL  Final  Blood Cultures x 2 sites     Status: None   Collection Time: 06/23/19  7:11 PM   Specimen: BLOOD RIGHT ARM  Result Value Ref Range Status   Specimen Description BLOOD RIGHT ARM  Final   Special Requests   Final    BOTTLES DRAWN AEROBIC ONLY Blood Culture results may not be optimal due to an inadequate volume of blood received in culture bottles Performed at [facility], [place] [address], [place], [place] [address]    Culture NO GROWTH 7 DAYS  Final   Report Status 06/30/2019 FINAL  Final      Radiology Studies: No results found.      Scheduled Meds: . aspirin EC  81 mg Oral Daily  . atorvastatin  40 mg Oral q1800  . canagliflozin  100 mg Oral QAC breakfast  . [START ON 07/03/2019] chlorhexidine  60 mL Topical Once  . Chlorhexidine Gluconate Cloth  6 each Topical Daily  . clopidogrel  75 mg Oral Q breakfast  . diazepam  5 mg Oral BID  . DULoxetine  30 mg Oral Daily  . enoxaparin (LOVENOX) injection  40 mg Subcutaneous Daily    . gabapentin  900 mg Oral TID  . insulin aspart  0-15 Units Subcutaneous TID WC  . insulin aspart  0-5 Units Subcutaneous QHS  . insulin detemir  40 Units Subcutaneous QHS  . levETIRAcetam  500 mg Oral BID  . lisinopril  20 mg Oral Daily  . metoprolol succinate  25 mg Oral Daily  . metroNIDAZOLE  500 mg Oral Q8H  . multivitamin with minerals  1 tablet Oral Daily  . nystatin cream   Topical BID  . pantoprazole  40 mg Oral Daily  . polyethylene glycol  17 g Oral Daily  . Ensure Max Protein  11 oz Oral QHS  . risperiDONE  0.5 mg Oral QHS  . sodium chloride flush  10-40 mL Intracatheter Q12H  . sodium chloride flush  3 mL Intravenous Q12H  .  traZODone  200 mg Oral QHS   Continuous Infusions: . sodium chloride 100 mL/hr at 07/02/19 1601  . sodium chloride    .  ceFAZolin (ANCEF) IV Stopped (07/02/19 1423)    Assessment & Plan:   PAD with gangrenous necrotic ulcers along the lateral aspect of  left foot and left fourth/fifth toe: Patient seen by vascular Sx, is now status post revascularization after [name] showed bilateral lower extremity PAD-drug-coated balloon angioplasty was done on left superficial femoral and popliteal artery with good response.Seen by Ortho for f/u regarding the area of gangrene involving the fourth and 5th toe with mild ischemic ulceration. Per Dr Ancil Boozer has demarcated her area of necrosis and a third/ fourth ray amputation with debridement and application of skin graft is the best option, plan for surgery on Wednesday.  Antibiotics narrowed to cefazolin based on her I&D culture results,Flagyl was added by ID to cover for anaerobics. -She will need 6 weeks of antibiotics for osteomyelitis-this duration may be changed after toe amputation, which is planned to be performed on 07/03/2019.Continue aspirin and Plavix for 32-month and follow-up with vascular surgery.PT/OT consult for home health needs.Supportive pain control  Type 2 diabetes.  Uncontrolled with A1c of  10. On Lantus 40 units at bedtime-will give half the dose as n.p.o. after midnight for procedure in a.m.Continue moderate SSI.  History of CVA.  No new neurologic deficit.S/P endovascular revascularization of left vertebral artery stenosis.Continue aspirin/Plavix and statin.  Seizure disorder.  No recent seizure-like activity.Continue Keppra  Bipolar with depression. Continue home dose of Cymbalta,risperidone.  Hypertension-hypotensive episode yesterday requiring fluid bolus-could have been related to IV Dilaudid. Improved with IV fluids. Hold lisinopril.   ?Hypokalemia: held potassium supplements in concern for high normal potassium level.  Pain management: Patient does not want long-acting oxycodone, prefers to keep current dose of Norco.  Has Dilaudid 0.5 mg as needed available.  Her blood pressure did drop to systolic 123XX123 this afternoon.  Give IV fluid bolus.  If recurs, may need to reduce hydrocodone dosing.  Tobacco use: Motivated to quit. States she has not had any cigarettes since being here and motivated to continue abstinence. She understands delayed wound healing if continues to smoke.  Moderate protein calorie malnutrition: Albumin level improved from 2.2 to 2.9. Protein supplements ordered.    DVT prophylaxis: Lovenox Code Status: Full code Family / Patient Communication: Discussed with patient and mother at bedside Disposition Plan: Home after surgical intervention and when medically cleared     LOS: 9 days    Time spent: 25 minutes    Guilford Shi, MD [facility] Pager [phone]  If 7PM-7AM, please contact night-coverage [email or web address] Password [facility] 07/02/2019, 6:34 PM

## 2019-07-02 NOTE — Progress Notes (Signed)
Inpatient Diabetes Program Recommendations  AACE/ADA: New Consensus Statement on Inpatient Glycemic Control (2015)  Target Ranges:  Prepandial:   less than 140 mg/dL      Peak postprandial:   less than 180 mg/dL (1-2 hours)      Critically ill patients:  140 - 180 mg/dL   Lab Results  Component Value Date   GLUCAP 220 (H) 07/02/2019   HGBA1C 10.0 (H) 06/23/2019    Review of Glycemic Control Results for Taylor [name], Taylor [name] (MRN [id number]) as of 07/02/2019 13:19  Ref. Range 07/01/2019 11:20 07/01/2019 16:26 07/01/2019 21:29 07/02/2019 07:26 07/02/2019 11:38  Glucose-Capillary Latest Ref Range: 70 - 99 mg/dL 269 (H) 210 (H) 164 (H) 231 (H) 220 (H)   Diabetes history: DM 2 Outpatient Diabetes medications: Farxiga 10 mg Daily, Metformin 500 mg bid Current orders for Inpatient glycemic control:  Levemir 40 units qhs Novolog 0-15 units tid + hs  Inpatient Diabetes Program Recommendations:   -Increase Levemir to 45 units daily Noted patient for procedure tomorrow.  Post-op may need meal coverage to assist with glycemic control.  Thank you, Nani Gasser. Dalyla Chui, RN, MSN, CDE  Diabetes Coordinator Inpatient Glycemic Control Team Team Pager [phone] (8am-5pm) 07/02/2019 1:21 PM

## 2019-07-02 NOTE — TOC Initial Note (Signed)
Transition of Care [facility]) - Initial/Assessment Note    Patient Details  Name: Taylor [name] MRN: [id number] Date of Birth: April 20, 1981  [facility] CM/SW Contact:    Bethena Roys, RN Phone Number: 07/02/2019, 5:16 PM  Clinical Narrative:  Patient presented for  management of left foot osteomyelitis. Plan for surgery on 07-03-19. [facility] has been following the patient regarding the possible need for IV antibiotics in the home. Case Manager will continue to follow for additional transition of care needs.                    Expected Discharge Plan: [place] Barriers to Discharge: Continued Medical Work up(Plan amputation of left 4th and 5th toes 07-03-19)   Expected Discharge Plan and Services Expected Discharge Plan: [place]   Discharge Planning Services: CM Consult   Living arrangements for the past 2 months: Single Family Home                  Prior Living Arrangements/Services Living arrangements for the past 2 months: Single Family Home Lives with:: Relatives Patient language and need for interpreter reviewed:: Yes        Need for Family Participation in Patient Care: Yes (Comment) Care giver support system in place?: Yes (comment)   Criminal Activity/Legal Involvement Pertinent to Current Situation/Hospitalization: No - Comment as needed  Activities of Daily Living Home Assistive Devices/Equipment: None ADL Screening (condition at time of admission) Patient's cognitive ability adequate to safely complete daily activities?: Yes Is the patient deaf or have difficulty hearing?: No Does the patient have difficulty seeing, even when wearing glasses/contacts?: No Does the patient have difficulty concentrating, remembering, or making decisions?: Yes Patient able to express need for assistance with ADLs?: Yes Does the patient have difficulty dressing or bathing?: No Independently performs ADLs?:  Yes (appropriate for developmental age) Does the patient have difficulty walking or climbing stairs?: Yes Weakness of Legs: Both Weakness of Arms/Hands: None  Permission Sought/Granted                  Emotional Assessment Appearance:: Appears stated age     Orientation: : Oriented to Self, Oriented to Place, Oriented to Situation Alcohol / Substance Use: Not Applicable Psych Involvement: No (comment)  Admission diagnosis:  Diabetic foot infection ([place]) [id number], L08.9] Patient Active Problem List   Diagnosis Date Noted  . Diabetic foot infection ([place]) 06/23/2019  . PAD (peripheral artery disease) ([place])   . Osteomyelitis of left foot ([place])   . Vertebral artery stenosis, left 03/25/2019  . VBI (vertebrobasilar insufficiency) 02/20/2019  . Intracranial carotid stenosis, left 01/23/2019  . 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 Tobacco use disorder 01/02/2015  . Cognitive impairment 01/02/2015  . Stroke ([place]) 09/07/2012  . Pain in upper limb 09/07/2012  . Pain in lower limb 09/07/2012  . Bipolar affective disorder ([place]) 09/07/2012  . Depressive disorder 09/07/2012  . Type II diabetes mellitus ([place]) 09/07/2012  . Memory loss 09/07/2012  . 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 Personal history of noncompliance with medical treatment, presenting hazards to health 05/25/2012  . Cerebral artery occlusion with cerebral infarction ([place]) 05/10/2012   PCP:  Monico Blitz, MD Pharmacy:   [place], [place] [id number] [place] [id number] Phone: [phone] Fax: [phone]     Social Determinants of Health (  SDOH) Interventions    Readmission Risk Interventions No flowsheet data found.

## 2019-07-03 ENCOUNTER — Inpatient Hospital Stay (HOSPITAL_COMMUNITY): Payer: Medicaid Other | Admitting: Anesthesiology

## 2019-07-03 ENCOUNTER — Encounter (HOSPITAL_COMMUNITY): Payer: Self-pay | Admitting: Internal Medicine

## 2019-07-03 ENCOUNTER — Encounter (HOSPITAL_COMMUNITY)
Admission: AD | Disposition: A | Discharge status: Home Health | Payer: Self-pay | Source: Other Acute Inpatient Hospital | Attending: Internal Medicine

## 2019-07-03 DIAGNOSIS — M86272 Subacute osteomyelitis, left ankle and foot: Secondary | ICD-10-CM

## 2019-07-03 HISTORY — PX: AMPUTATION: SHX166

## 2019-07-03 HISTORY — PX: SKIN SPLIT GRAFT: SHX444

## 2019-07-03 LAB — GLUCOSE, CAPILLARY
Glucose-Capillary: 157 mg/dL — ABNORMAL HIGH (ref 70–99)
Glucose-Capillary: 144 mg/dL — ABNORMAL HIGH (ref 70–99)
Glucose-Capillary: 127 mg/dL — ABNORMAL HIGH (ref 70–99)
Glucose-Capillary: 271 mg/dL — ABNORMAL HIGH (ref 70–99)
Glucose-Capillary: 159 mg/dL — ABNORMAL HIGH (ref 70–99)

## 2019-07-03 LAB — BASIC METABOLIC PANEL
Anion gap: 8 (ref 5–15)
BUN: 18 mg/dL (ref 6–20)
CO2: 27 mmol/L (ref 22–32)
Calcium: 8.4 mg/dL — ABNORMAL LOW (ref 8.9–10.3)
Chloride: 102 mmol/L (ref 98–111)
Creatinine, Ser: 1.05 mg/dL — ABNORMAL HIGH (ref 0.44–1.00)
GFR calc Af Amer: >60 mL/min (ref >60)
GFR calc non Af Amer: >60 mL/min (ref >60)
Glucose, Bld: 298 mg/dL — ABNORMAL HIGH (ref 70–99)
Potassium: 4.5 mmol/L (ref 3.5–5.1)
Sodium: 137 mmol/L (ref 135–145)

## 2019-07-03 LAB — HEMOGLOBIN AND HEMATOCRIT, BLOOD
HCT: 28.9 % — ABNORMAL LOW (ref 36.0–46.0)
Hemoglobin: 7.9 g/dL — ABNORMAL LOW (ref 12.0–15.0)

## 2019-07-03 SURGERY — AMPUTATION, FOOT, RAY
Anesthesia: General | Site: Foot | Laterality: Left

## 2019-07-03 MED ORDER — INSULIN DETEMIR 100 UNIT/ML ~~LOC~~ SOLN
40.0000 [IU] | Freq: Every day | SUBCUTANEOUS | Status: DC
  Administered 2019-07-03 – 2019-07-04 (×2): 40 [IU] via SUBCUTANEOUS
  Filled 2019-07-03 (×3): qty 0.4

## 2019-07-03 MED ORDER — MIDAZOLAM HCL 2 MG/2ML IJ SOLN
INTRAMUSCULAR | Status: AC
  Administered 2019-07-03: 2 mg via INTRAVENOUS
  Filled 2019-07-03: qty 2

## 2019-07-03 MED ORDER — LACTATED RINGERS IV SOLN: INTRAVENOUS | Status: DC

## 2019-07-03 MED ORDER — MIDAZOLAM HCL 5 MG/5ML IJ SOLN
INTRAMUSCULAR | Status: DC | PRN
  Administered 2019-07-03: 1 mg via INTRAVENOUS

## 2019-07-03 MED ORDER — MIDAZOLAM HCL 2 MG/2ML IJ SOLN
INTRAMUSCULAR | Status: AC
  Filled 2019-07-03: qty 2

## 2019-07-03 MED ORDER — ACETAMINOPHEN 500 MG PO TABS
1000.0000 mg | ORAL_TABLET | Freq: Once | ORAL | Status: DC
  Filled 2019-07-03: qty 2

## 2019-07-03 MED ORDER — FENTANYL CITRATE (PF) 100 MCG/2ML IJ SOLN: 100.0000 ug | Freq: Once | INTRAMUSCULAR | Status: AC

## 2019-07-03 MED ORDER — SODIUM CHLORIDE 0.9 % IV SOLN: INTRAVENOUS | Status: DC

## 2019-07-03 MED ORDER — CEFAZOLIN SODIUM-DEXTROSE 2-4 GM/100ML-% IV SOLN: 2.0000 g | INTRAVENOUS | Status: DC

## 2019-07-03 MED ORDER — CELECOXIB 200 MG PO CAPS
400.0000 mg | ORAL_CAPSULE | Freq: Once | ORAL | Status: AC
  Administered 2019-07-03: 400 mg via ORAL
  Filled 2019-07-03 (×2): qty 2
  Filled 2019-07-03: qty 1

## 2019-07-03 MED ORDER — METOCLOPRAMIDE HCL 5 MG/ML IJ SOLN: 5.0000 mg | Freq: Three times a day (TID) | INTRAMUSCULAR | Status: DC | PRN

## 2019-07-03 MED ORDER — METOCLOPRAMIDE HCL 5 MG PO TABS: 5.0000 mg | ORAL_TABLET | Freq: Three times a day (TID) | ORAL | Status: DC | PRN

## 2019-07-03 MED ORDER — MIDAZOLAM HCL 2 MG/2ML IJ SOLN: 2.0000 mg | Freq: Once | INTRAMUSCULAR | Status: AC

## 2019-07-03 MED ORDER — PROPOFOL 10 MG/ML IV BOLUS
INTRAVENOUS | Status: AC
  Filled 2019-07-03: qty 40

## 2019-07-03 MED ORDER — ONDANSETRON HCL 4 MG/2ML IJ SOLN
INTRAMUSCULAR | Status: AC
  Filled 2019-07-03: qty 4

## 2019-07-03 MED ORDER — 0.9 % SODIUM CHLORIDE (POUR BTL) OPTIME
TOPICAL | Status: DC | PRN
  Administered 2019-07-03: 1000 mL

## 2019-07-03 MED ORDER — FENTANYL CITRATE (PF) 100 MCG/2ML IJ SOLN
INTRAMUSCULAR | Status: AC
  Filled 2019-07-03: qty 2

## 2019-07-03 MED ORDER — FENTANYL CITRATE (PF) 100 MCG/2ML IJ SOLN
25.0000 ug | INTRAMUSCULAR | Status: DC | PRN
  Administered 2019-07-03: 50 ug via INTRAVENOUS

## 2019-07-03 MED ORDER — FENTANYL CITRATE (PF) 250 MCG/5ML IJ SOLN
INTRAMUSCULAR | Status: AC
  Filled 2019-07-03: qty 5

## 2019-07-03 MED ORDER — FENTANYL CITRATE (PF) 100 MCG/2ML IJ SOLN
INTRAMUSCULAR | Status: AC
  Administered 2019-07-03: 10:00:00 100 ug via INTRAVENOUS
  Filled 2019-07-03: qty 2

## 2019-07-03 MED ORDER — PROPOFOL 500 MG/50ML IV EMUL
INTRAVENOUS | Status: DC | PRN
  Administered 2019-07-03: 50 ug/kg/min via INTRAVENOUS

## 2019-07-03 MED ORDER — DEXAMETHASONE SODIUM PHOSPHATE 10 MG/ML IJ SOLN
INTRAMUSCULAR | Status: AC
  Filled 2019-07-03: qty 2

## 2019-07-03 MED ORDER — LIDOCAINE 2% (20 MG/ML) 5 ML SYRINGE
INTRAMUSCULAR | Status: AC
  Filled 2019-07-03: qty 10

## 2019-07-03 MED ORDER — BUPIVACAINE LIPOSOME 1.3 % IJ SUSP
INTRAMUSCULAR | Status: DC | PRN
  Administered 2019-07-03: 10 mg via PERINEURAL

## 2019-07-03 MED ORDER — DOCUSATE SODIUM 100 MG PO CAPS
100.0000 mg | ORAL_CAPSULE | Freq: Two times a day (BID) | ORAL | Status: DC
  Administered 2019-07-03 – 2019-07-04 (×3): 100 mg via ORAL
  Filled 2019-07-03 (×3): qty 1

## 2019-07-03 MED ORDER — BUPIVACAINE HCL (PF) 0.25 % IJ SOLN
INTRAMUSCULAR | Status: DC | PRN
  Administered 2019-07-03: 15 mg via EPIDURAL

## 2019-07-03 MED ORDER — HYDROMORPHONE HCL 1 MG/ML IJ SOLN
0.5000 mg | INTRAMUSCULAR | Status: DC | PRN
  Administered 2019-07-03 – 2019-07-04 (×3): 1 mg via INTRAVENOUS
  Filled 2019-07-03 (×3): qty 1

## 2019-07-03 MED ORDER — ONDANSETRON HCL 4 MG/2ML IJ SOLN
INTRAMUSCULAR | Status: DC | PRN
  Administered 2019-07-03: 4 mg via INTRAVENOUS

## 2019-07-03 SURGICAL SUPPLY — 59 items
ALLOGRAFT SKIN MESHD 125 SQ CM (Graft) ×1 IMPLANT
BLADE SAW SGTL MED 73X18.5 STR (BLADE) IMPLANT
BLADE SURG 21 STRL SS (BLADE) ×3 IMPLANT
BNDG CMPR 9X4 STRL LF SNTH (GAUZE/BANDAGES/DRESSINGS) ×1
BNDG COHESIVE 4X5 TAN STRL (GAUZE/BANDAGES/DRESSINGS) ×3 IMPLANT
BNDG COHESIVE 6X5 TAN STRL LF (GAUZE/BANDAGES/DRESSINGS) ×2 IMPLANT
BNDG ESMARK 4X9 LF (GAUZE/BANDAGES/DRESSINGS) ×3 IMPLANT
BNDG GAUZE ELAST 4 BULKY (GAUZE/BANDAGES/DRESSINGS) ×3 IMPLANT
CANISTER WOUNDNEG PRESSURE 500 (CANNISTER) ×2 IMPLANT
COVER SURGICAL LIGHT HANDLE (MISCELLANEOUS) ×6 IMPLANT
COVER WAND RF STERILE (DRAPES) ×3 IMPLANT
CUFF TOURN SGL QUICK 18X4 (TOURNIQUET CUFF) IMPLANT
CUFF TOURN SGL QUICK 24 (TOURNIQUET CUFF)
CUFF TRNQT CYL 24X4X16.5-23 (TOURNIQUET CUFF) IMPLANT
DERMACARRIERS GRAFT 1 TO 1.5 (DISPOSABLE)
DRAPE INCISE IOBAN 66X45 STRL (DRAPES) ×2 IMPLANT
DRAPE U-SHAPE 47X51 STRL (DRAPES) ×6 IMPLANT
DRESSING VERAFLO CLEANSE CC (GAUZE/BANDAGES/DRESSINGS) IMPLANT
DRSG ADAPTIC 3X8 NADH LF (GAUZE/BANDAGES/DRESSINGS) ×3 IMPLANT
DRSG MEPITEL 4X7.2 (GAUZE/BANDAGES/DRESSINGS) ×3 IMPLANT
DRSG PAD ABDOMINAL 8X10 ST (GAUZE/BANDAGES/DRESSINGS) ×6 IMPLANT
DRSG VERAFLO CLEANSE CC (GAUZE/BANDAGES/DRESSINGS) ×3
DURAPREP 26ML APPLICATOR (WOUND CARE) ×3 IMPLANT
ELECT REM PT RETURN 9FT ADLT (ELECTROSURGICAL) ×3
ELECTRODE REM PT RTRN 9FT ADLT (ELECTROSURGICAL) ×1 IMPLANT
GAUZE SPONGE 4X4 12PLY STRL (GAUZE/BANDAGES/DRESSINGS) ×3 IMPLANT
GLOVE BIOGEL PI IND STRL 9 (GLOVE) ×1 IMPLANT
GLOVE BIOGEL PI INDICATOR 9 (GLOVE) ×2
GLOVE SURG ORTHO 9.0 STRL STRW (GLOVE) ×3 IMPLANT
GOWN STRL REUS W/ TWL XL LVL3 (GOWN DISPOSABLE) ×2 IMPLANT
GOWN STRL REUS W/TWL XL LVL3 (GOWN DISPOSABLE) ×6
GRAFT DERMACARRIERS 1 TO 1.5 (DISPOSABLE) IMPLANT
GRAFT TISS MESH 125 BURN (Graft) IMPLANT
KIT BASIN OR (CUSTOM PROCEDURE TRAY) ×3 IMPLANT
KIT TURNOVER KIT B (KITS) ×3 IMPLANT
MANIFOLD NEPTUNE II (INSTRUMENTS) ×3 IMPLANT
MICROMATRIX 500MG (Tissue) ×3 IMPLANT
NDL HYPO 25GX1X1/2 BEV (NEEDLE) IMPLANT
NEEDLE HYPO 25GX1X1/2 BEV (NEEDLE) IMPLANT
NS IRRIG 1000ML POUR BTL (IV SOLUTION) ×3 IMPLANT
PACK ORTHO EXTREMITY (CUSTOM PROCEDURE TRAY) ×3 IMPLANT
PAD ARMBOARD 7.5X6 YLW CONV (MISCELLANEOUS) ×6 IMPLANT
PAD CAST 4YDX4 CTTN HI CHSV (CAST SUPPLIES) IMPLANT
PAD NEG PRESSURE SENSATRAC (MISCELLANEOUS) ×2 IMPLANT
PADDING CAST COTTON 4X4 STRL (CAST SUPPLIES)
SKIN MESHED 125 SQ CM (Graft) ×3 IMPLANT
SOLUTION PARTIC MCRMTRX 500MG (Tissue) IMPLANT
STOCKINETTE IMPERVIOUS LG (DRAPES) IMPLANT
SUCTION FRAZIER HANDLE 10FR (MISCELLANEOUS)
SUCTION TUBE FRAZIER 10FR DISP (MISCELLANEOUS) IMPLANT
SUT ETHILON 2 0 PSLX (SUTURE) ×7 IMPLANT
SUT ETHILON 4 0 PS 2 18 (SUTURE) ×2 IMPLANT
SYR CONTROL 10ML LL (SYRINGE) IMPLANT
TOWEL GREEN STERILE (TOWEL DISPOSABLE) ×3 IMPLANT
TOWEL GREEN STERILE FF (TOWEL DISPOSABLE) ×3 IMPLANT
TUBE CONNECTING 12'X1/4 (SUCTIONS) ×1
TUBE CONNECTING 12X1/4 (SUCTIONS) ×2 IMPLANT
WATER STERILE IRR 1000ML POUR (IV SOLUTION) ×3 IMPLANT
YANKAUER SUCT BULB TIP NO VENT (SUCTIONS) ×3 IMPLANT

## 2019-07-03 NOTE — Evaluation (Signed)
Physical Therapy Evaluation Patient Details Name: Taylor [name] MRN: [id number] DOB: 1981-04-04 Today's Date: 07/03/2019   History of Present Illness  Pt is a 39 y/o female transferred from [facility] for management of L foot osteomyelitis. Pt is s/p L 4th and 5th ray amputation. PMH includes PAD, HTN, DM, CVA.   Clinical Impression  Pt is s/p surgery above with deficits below. Pt stood using RW and required min A. Was unable to maintain NWB on LLE and pt reports increased pain, therefore returned to sitting. Feel pt will require WC in order to maintain appropriate weightbearing status. Reports her parents will be able to assist upon d/c. Will continue to follow acutely to maximize functional mobility independence and safety.     Follow Up Recommendations Home health PT;Supervision/Assistance - 24 hour    Equipment Recommendations  Wheelchair (measurements PT);Wheelchair cushion (measurements PT);Rolling walker with 5" wheels;3in1 (PT)    Recommendations for Other Services       Precautions / Restrictions Precautions Precautions: Fall Precaution Comments: Wound vac Required Braces or Orthoses: Other Brace Other Brace: L post op shoe Restrictions Weight Bearing Restrictions: Yes LLE Weight Bearing: Non weight bearing      Mobility  Bed Mobility Overal bed mobility: Needs Assistance Bed Mobility: Supine to Sit;Sit to Supine     Supine to sit: Min assist Sit to supine: Supervision   General bed mobility comments: Min A For trunk elevation to come to sitting. Supervision for safety to return to supine.   Transfers Overall transfer level: Needs assistance Equipment used: Rolling walker (2 wheeled) Transfers: Sit to/from Stand Sit to Stand: Min assist         General transfer comment: Min A for steadying assist to stand. Verbal instruction to maintain NWB on LLE, however, pt unable to maintained. Pt also reporting increase in pain, so returned to sitting. Notifed RN  that pt requested pain meds.   Ambulation/Gait                Stairs            Wheelchair Mobility    Modified Rankin (Stroke Patients Only)       Balance Overall balance assessment: Needs assistance Sitting-balance support: No upper extremity supported;Feet supported Sitting balance-Leahy Scale: Good     Standing balance support: Bilateral upper extremity supported;During functional activity Standing balance-Leahy Scale: Poor Standing balance comment: Reliant on BUE support                              Pertinent Vitals/Pain Pain Assessment: Faces Faces Pain Scale: Hurts whole lot Pain Location: L foot  Pain Descriptors / Indicators: Grimacing;Operative site guarding;Aching Pain Intervention(s): Limited activity within patient's tolerance;Monitored during session;Repositioned    Home Living Family/patient expects to be discharged to:: Private residence Living Arrangements: Parent Available Help at Discharge: Family;Available 24 hours/day Type of Home: House Home Access: Stairs to enter Entrance Stairs-Rails: Right Entrance Stairs-Number of Steps: 1 porch step Home Layout: One level Home Equipment: Shower seat      Prior Function Level of Independence: Independent               Hand Dominance        Extremity/Trunk Assessment   Upper Extremity Assessment Upper Extremity Assessment: Defer to OT evaluation    Lower Extremity Assessment Lower Extremity Assessment: LLE deficits/detail LLE Deficits / Details: s/p 4th and 5th toe amputations    Cervical /  Trunk Assessment Cervical / Trunk Assessment: Normal  Communication   Communication: No difficulties  Cognition Arousal/Alertness: Awake/alert Behavior During Therapy: WFL for tasks assessed/performed Overall Cognitive Status: No family/caregiver present to determine baseline cognitive functioning                                        General Comments  General comments (skin integrity, edema, etc.): Educated about equipment recommendations including wheel chair in order to maintain weightbearing precautions.     Exercises     Assessment/Plan    PT Assessment Patient needs continued PT services  PT Problem List Decreased strength;Decreased balance;Decreased activity tolerance;Decreased mobility;Decreased knowledge of precautions;Decreased knowledge of use of DME;Decreased safety awareness;Decreased cognition;Pain       PT Treatment Interventions Gait training;Functional mobility training;Therapeutic activities;DME instruction;Balance training;Therapeutic exercise;Wheelchair mobility training;Patient/family education;Stair training    PT Goals (Current goals can be found in the Care Plan section)  Acute Rehab PT Goals Patient Stated Goal: to get better and get out of the hospital  PT Goal Formulation: With patient Time For Goal Achievement: 07/17/19 Potential to Achieve Goals: Good    Frequency Min 3X/week   Barriers to discharge        Co-evaluation               AM-PAC PT "6 Clicks" Mobility  Outcome Measure Help needed turning from your back to your side while in a flat bed without using bedrails?: A Little Help needed moving from lying on your back to sitting on the side of a flat bed without using bedrails?: A Little Help needed moving to and from a bed to a chair (including a wheelchair)?: A Little Help needed standing up from a chair using your arms (e.g., wheelchair or bedside chair)?: A Little Help needed to walk in hospital room?: A Lot Help needed climbing 3-5 steps with a railing? : Total 6 Click Score: 15    End of Session Equipment Utilized During Treatment: Gait belt Activity Tolerance: Patient limited by pain Patient left: in bed;with call bell/phone within reach;with bed alarm set Nurse Communication: Mobility status;Patient requests pain meds PT Visit Diagnosis: Difficulty in walking, not elsewhere  classified (R26.2);Pain Pain - Right/Left: Left Pain - part of body: Ankle and joints of foot    Time: 1429-1450 PT Time Calculation (min) (ACUTE ONLY): 21 min   Charges:   PT Evaluation $PT Eval Moderate Complexity: 1 Mod          Reuel Derby, PT, DPT  Acute Rehabilitation Services  Pager: [phone] Office: [phone]   Rudean Hitt 07/03/2019, 3:00 PM

## 2019-07-03 NOTE — Interval H&P Note (Signed)
History and Physical Interval Note:  07/03/2019 7:06 AM  Taylor [name]  has presented today for surgery, with the diagnosis of Gangrene Left Foot.  The various methods of treatment have been discussed with the patient and family. After consideration of risks, benefits and other options for treatment, the patient has consented to  Procedure(s): Maurice (Left) APPLY SKIN GRAFT LEFT FOOT (Left) as a surgical intervention.  The patient's history has been reviewed, patient examined, no change in status, stable for surgery.  I have reviewed the patient's chart and labs.  Questions were answered to the patient's satisfaction.     Newt Minion

## 2019-07-03 NOTE — Transfer of Care (Signed)
Immediate Anesthesia Transfer of Care Note  Patient: Taylor [name]  Procedure(s) Performed: LEFT FOOT 4TH AND 5TH RAY AMPUTATION (Left Foot) APPLY SKIN GRAFT LEFT FOOT (Left Foot)  Patient Location: PACU  Anesthesia Type:MAC combined with regional for post-op pain  Level of Consciousness: awake, alert  and oriented  Airway & Oxygen Therapy: Patient Spontanous Breathing and Patient connected to face mask oxygen  Post-op Assessment: Report given to RN and Post -op Vital signs reviewed and stable  Post vital signs: Reviewed and stable  Last Vitals:  Vitals Value Taken Time  BP 106/63 07/03/19 1035  Temp 36.4 C 07/03/19 1035  Pulse 85 07/03/19 1037  Resp 19 07/03/19 1037  SpO2 99 % 07/03/19 1037  Vitals shown include unvalidated device data.  Last Pain:  Vitals:   07/03/19 0935  TempSrc:   PainSc: 0-No pain      Patients Stated Pain Goal: 2 (58/30/94 0768)  Complications: No apparent anesthesia complications

## 2019-07-03 NOTE — Anesthesia Postprocedure Evaluation (Signed)
Anesthesia Post Note  Patient: Taylor [name]  Procedure(s) Performed: LEFT FOOT 4TH AND 5TH RAY AMPUTATION (Left Foot) APPLY SKIN GRAFT LEFT FOOT (Left Foot)     Patient location during evaluation: PACU Anesthesia Type: General Level of consciousness: sedated Pain management: pain level controlled Vital Signs Assessment: post-procedure vital signs reviewed and stable Respiratory status: spontaneous breathing and respiratory function stable Cardiovascular status: stable Postop Assessment: no apparent nausea or vomiting Anesthetic complications: no    Last Vitals:  Vitals:   07/03/19 1050 07/03/19 1105  BP: (!) 88/54 (!) 92/55  Pulse: 76 79  Resp: 20 16  Temp:  (!) 36.4 C  SpO2: 97% 94%    Last Pain:  Vitals:   07/03/19 1042  TempSrc:   PainSc: 6                  Maylon Sailors DANIEL

## 2019-07-03 NOTE — Anesthesia Procedure Notes (Signed)
Anesthesia Regional Block: Popliteal block   Pre-Anesthetic Checklist: ,, timeout performed, Correct Patient, Correct Site, Correct Laterality, Correct Procedure, Correct Position, site marked, Risks and benefits discussed,  Surgical consent,  Pre-op evaluation,  At surgeon's request and post-op pain management  Laterality: Left  Prep: chloraprep       Needles:  Injection technique: Single-shot  Needle Type: Echogenic Stimulator Needle          Additional Needles:   Narrative:  Start time: 07/03/2019 9:22 AM End time: 07/03/2019 9:32 AM Injection made incrementally with aspirations every 5 mL.  Performed by: Personally  Anesthesiologist: Duane Boston, MD  Additional Notes: A functioning IV was confirmed and monitors were applied.  Sterile prep and drape, hand hygiene and sterile gloves were used.  Negative aspiration and test dose prior to incremental administration of local anesthetic. The patient tolerated the procedure well.Ultrasound  guidance: relevant anatomy identified, needle position confirmed, local anesthetic spread visualized around nerve(s), vascular puncture avoided.  Image printed for medical record.

## 2019-07-03 NOTE — Progress Notes (Signed)
PROGRESS NOTE    Taylor [name]  [id number]  DOB: [birth year]  PCP: Monico Blitz, MD Admit date:06/23/2019 Hospital course: 39 y.o.femalewith medical history significantfor type 2 diabetes, previous stroke, seizures, hypertension, and hyperlipidemia.Patient was hospitalized at [facility] facility from 06/12/2019 to1/10/2020forleft foot infection with cellulitis and abscess complicated by left fifth toe osteomyelitis, necrotic ulcer.  I&D was performed and culture grew pan MSSA.  She did have abnormal [name] and she was transferred to [facility] for vascular surgery evaluation.  Initially treated with cefepime and vancomycin. Antibiotics narrowed to cefazolin based on culture results, Flagyl was added by ID for anaerobic coverage.Hospital course complicated by hypotension and uncontrolled toe pain, on hydrocodone 2 tabs every 4 hours as needed along with Dilaudid 0.5 mg IV every 4 hours as needed for breakthrough pain.  Patient prefers hydrocodone over Dilaudid.  She does not want to try long-acting oxycodone.  Subjective: Sleeping comfortably when I entered the room, patient however complaining of persistent pain in her toes and requesting not to change hydrocodone dose. Blood pressure better today. Remains on IV fluids. Objective: Vitals:   07/03/19 1050 07/03/19 1105 07/03/19 1129 07/03/19 1147  BP: (!) 88/54 (!) 92/55 (!) 104/52   Pulse: 76 79 76   Resp: 20 16    Temp:  (!) 97.5 F (36.4 C)  97.7 F (36.5 C)  TempSrc:      SpO2: 97% 94% 96%   Weight:      Height:        Intake/Output Summary (Last 24 hours) at 07/03/2019 1647 Last data filed at 07/03/2019 1035 Gross per 24 hour  Intake 2199.53 ml  Output 25 ml  Net 2174.53 ml   Filed Weights   07/02/19 0625 07/03/19 0606 07/03/19 0847  Weight: 91.8 kg 91.9 kg 91.9 kg    Physical Examination:  General exam: Appears calm and comfortable  Respiratory system: Clear to auscultation. Respiratory effort  normal. Cardiovascular system: S1 & S2 heard, RRR. No JVD, murmurs. No pedal edema. Gastrointestinal system: Abdomen is nondistended, soft and nontender. Normal bowel sounds heard. Central nervous system: Alert and oriented. No new focal neurological deficits. Extremities: Gangrenous changes noted on left fourth and fifth toes, local dressing along the left foot Skin: No rashes, lesions or ulcers Psychiatry: Judgement and insight appear somewhat impaired, mood & affect appropriate.   Data Reviewed: I have personally reviewed following labs and imaging studies  CBC: Recent Labs  Lab 06/28/19 1018 07/01/19 0538 07/03/19 1545  WBC 9.6 8.4  --   HGB 8.9* 9.2* 7.9*  HCT 31.5* 32.6* 28.9*  MCV 79.3* 78.2*  --   PLT 413* 408*  --    Basic Metabolic Panel: Recent Labs  Lab 07/01/19 0538 07/03/19 1545  NA 140 137  K 4.8 4.5  CL 102 102  CO2 27 27  GLUCOSE 161* 298*  BUN 15 18  CREATININE 0.94 1.05*  CALCIUM 9.3 8.4*   GFR: Estimated Creatinine Clearance: 79 mL/min (A) (by C-G formula based on SCr of 1.05 mg/dL (H)). Liver Function Tests: Recent Labs  Lab 07/01/19 0538  AST 27  ALT 14  ALKPHOS 74  BILITOT 0.5  PROT 6.5  ALBUMIN 2.9*   No results for input(s): LIPASE, AMYLASE in the last 168 hours. No results for input(s): AMMONIA in the last 168 hours. Coagulation Profile: No results for input(s): INR, PROTIME in the last 168 hours. Cardiac Enzymes: No results for input(s): CKTOTAL, CKMB, CKMBINDEX, TROPONINI in the last 168 hours. BNP (  last 3 results) No results for input(s): PROBNP in the last 8760 hours. HbA1C: No results for input(s): HGBA1C in the last 72 hours. CBG: Recent Labs  Lab 07/02/19 2057 07/03/19 0742 07/03/19 1035 07/03/19 1145 07/03/19 1640  GLUCAP 175* 157* 144* 127* 271*   Lipid Profile: No results for input(s): CHOL, HDL, LDLCALC, TRIG, CHOLHDL, LDLDIRECT in the last 72 hours. Thyroid Function Tests: No results for input(s): TSH,  T4TOTAL, FREET4, T3FREE, THYROIDAB in the last 72 hours. Anemia Panel: No results for input(s): VITAMINB12, FOLATE, FERRITIN, TIBC, IRON, RETICCTPCT in the last 72 hours. Sepsis Labs: No results for input(s): PROCALCITON, LATICACIDVEN in the last 168 hours.  Recent Results (from the past 240 hour(s))  Blood Cultures x 2 sites     Status: None   Collection Time: 06/23/19  7:01 PM   Specimen: BLOOD LEFT ARM  Result Value Ref Range Status   Specimen Description BLOOD LEFT ARM  Final   Special Requests   Final    BOTTLES DRAWN AEROBIC ONLY Blood Culture results may not be optimal due to an inadequate volume of blood received in culture bottles Performed at [facility], [place] [address] [place], [place] [address]    Culture NO GROWTH 7 DAYS  Final   Report Status 06/30/2019 FINAL  Final  Blood Cultures x 2 sites     Status: None   Collection Time: 06/23/19  7:11 PM   Specimen: BLOOD RIGHT ARM  Result Value Ref Range Status   Specimen Description BLOOD RIGHT ARM  Final   Special Requests   Final    BOTTLES DRAWN AEROBIC ONLY Blood Culture results may not be optimal due to an inadequate volume of blood received in culture bottles Performed at [facility], [place] [address] [place], [place] [address]    Culture NO GROWTH 7 DAYS  Final   Report Status 06/30/2019 FINAL  Final      Radiology Studies: No results found.      Scheduled Meds: . aspirin EC  81 mg Oral Daily  . atorvastatin  40 mg Oral q1800  . canagliflozin  100 mg Oral QAC breakfast  . Chlorhexidine Gluconate Cloth  6 each Topical Daily  . clopidogrel  75 mg Oral Q breakfast  . diazepam  5 mg Oral BID  . docusate sodium  100 mg Oral BID  . DULoxetine  30 mg Oral Daily  . enoxaparin (LOVENOX) injection  40 mg Subcutaneous Daily  . fentaNYL      . gabapentin  900 mg Oral TID  . insulin aspart  0-15 Units Subcutaneous TID WC  . insulin aspart  0-5 Units Subcutaneous QHS  . insulin detemir  20 Units  Subcutaneous QHS  . levETIRAcetam  500 mg Oral BID  . metoprolol succinate  25 mg Oral Daily  . metroNIDAZOLE  500 mg Oral Q8H  . multivitamin with minerals  1 tablet Oral Daily  . nystatin cream   Topical BID  . pantoprazole  40 mg Oral Daily  . polyethylene glycol  17 g Oral Daily  . Ensure Max Protein  11 oz Oral QHS  . risperiDONE  0.5 mg Oral QHS  . sodium chloride flush  10-40 mL Intracatheter Q12H  . sodium chloride flush  3 mL Intravenous Q12H  . traZODone  200 mg Oral QHS   Continuous Infusions: . sodium chloride 100 mL/hr at 07/03/19 0300  . sodium chloride    . sodium chloride    .  ceFAZolin (ANCEF) IV 2 g (07/03/19 1312)  . lactated ringers 10 mL/hr at 07/03/19 0844    Assessment & Plan:   PAD with gangrenous necrotic ulcers along the lateral aspect of  left foot and left fourth/fifth toe: Patient seen by vascular Sx, is now status post revascularization after [name] showed bilateral lower extremity PAD-drug-coated balloon angioplasty was done on left superficial femoral and popliteal artery with good response.Seen by Ortho for f/u regarding the area of gangrene involving the fourth and 5th toe with mild ischemic ulceration. Per Dr Ancil Boozer has demarcated her area of necrosis and a third/ fourth ray amputation with debridement and application of skin graft is the best option, plan for surgery on Wednesday.  Antibiotics narrowed to cefazolin based on her I&D culture results,Flagyl was added by ID to cover for anaerobics. -She will need 6 weeks of antibiotics for osteomyelitis-this duration may be changed after toe amputation, which is performed today 07/03/2019-ID to follow-up and advise.Continue aspirin and Plavix for 2-month and follow-up with vascular surgery.continue wound VAC for 1 week after discharge per Dr. Sharol Given.  Nonweightbearing on the left.  Seen by PT/OT--recommended 24-hour supervision/home PT.  Continue current pain medication regimen, taper off IV meds as  tolerated.  Type 2 diabetes.  Uncontrolled with A1c of 10.  Was n.p.o. for procedure, now eating.  Resume Lantus 40 units at bedtime-Continue moderate SSI.  History of CVA.  No new neurologic deficit.S/P endovascular revascularization of left vertebral artery stenosis.Continue aspirin/Plavix and statin.  Seizure disorder.  No recent seizure-like activity.Continue Keppra  Bipolar with depression. Continue home dose of Cymbalta,risperidone.  Hypertension-hypotensive episode yesterday requiring fluid bolus-could have been related to IV Dilaudid. Improved with IV fluids. Held lisinopril.   Pain management: Patient does not want long-acting oxycodone, prefers to keep current dose of Norco.  Has Dilaudid 0.5 mg as needed available.  Her blood pressure did drop to systolic [id number] on Monday requiring IV fluid bolus. If recurs, may need to reduce hydrocodone dosing.  Tobacco use:  Patient states she has decided to quit smoking for good as she wants her wounds to heal well. States she has not had any cigarettes since being here and motivated to continue abstinence.   Moderate protein calorie malnutrition: Albumin level improved from 2.2 to 2.9. Protein supplements ordered.    DVT prophylaxis: Lovenox Code Status: Full code Family / Patient Communication: Discussed with patient and mother at bedside Disposition Plan: Home when medically cleared-- off IV pain medications, cleared by Ortho/PT     LOS: 10 days    Time spent: 35 minutes    [place]ford Shi, MD [facility] Pager [phone]  If 7PM-7AM, please contact night-coverage [email or web address] Password [facility] 07/03/2019, 4:47 PM

## 2019-07-03 NOTE — Op Note (Signed)
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 Local tissue rearrangement for wound closure 5 x 10 cm.  SURGEON:  Newt Minion, MD  PHYSICIAN ASSISTANT:None ANESTHESIA:   General  PREOPERATIVE INDICATIONS:  Taylor [name] is a  39 y.o. female with a diagnosis of Gangrene Left Foot who failed conservative measures and elected for surgical management.    The risks benefits and alternatives were discussed with the patient preoperatively including but not limited to the risks of infection, bleeding, nerve injury, cardiopulmonary complications, the need for revision surgery, among others, and the patient was willing to proceed.  OPERATIVE IMPLANTS: 500 mg of ACell powder and 125 cm of split-thickness skin graft.  @ENCIMAGES @  OPERATIVE FINDINGS: Minimal petechial bleeding no deep abscess.  OPERATIVE PROCEDURE: Patient was brought the operating room after undergoing a regional block the left lower extremity was prepped using DuraPrep draped into a sterile field a timeout was called.  Elliptical incision was made around the gangrenous tissue this was carried sharply down to bone and the fourth and fifth ray amputations were performed through the base of the bone.  Patient had a large necrotic abscess of the dorsum of her foot a 21 blade knife was used for wound bed preparation with excision of skin soft tissue muscle and tendon.  There was minimal petechial bleeding.  The wound was irrigated with normal saline electrocautery was used for hemostasis.  Local tissue rearrangement was used to close the lateral wound that was 5 x 10 cm.  The dorsal wound was then covered with ACell powder and then the split-thickness skin graft was secured with staples.  This was all covered with ACell reticulated foam followed  by solid foam covered with Ioban and Covan.  This had a good suction fit patient was taken to PACU in stable condition.   DISCHARGE PLANNING:  Antibiotic duration: Continue IV antibiotics  Weightbearing: Nonweightbearing on the left  Pain medication: Opioid pathway  Dressing care/ Wound VAC: Continue wound VAC for 1 week after discharge  Ambulatory devices: Walker   Discharge to: Discharge pending recommendation from physical therapy  Follow-up: In the office 1 week post operative.

## 2019-07-03 NOTE — Progress Notes (Signed)
Orthopedic Tech Progress Note Patient Details:  Taylor [name] [birth year] [id number]  Ortho Devices Type of Ortho Device: Postop shoe/boot Ortho Device/Splint Location: lle Ortho Device/Splint Interventions: Ordered, Application, Adjustment   Post Interventions Patient Tolerated: Well Instructions Provided: Care of device, Adjustment of device   [name] 07/03/2019, 3:26 PM

## 2019-07-03 NOTE — Anesthesia Preprocedure Evaluation (Addendum)
Anesthesia Evaluation  Patient identified by MRN, date of birth, ID band Patient awake    Reviewed: Allergy & Precautions, NPO status , Patient's Chart, lab work & pertinent test results  History of Anesthesia Complications Negative for: history of anesthetic complications  Airway Mallampati: II  TM Distance: >3 FB Neck ROM: Full    Dental no notable dental hx. (+) Dental Advisory Given   Pulmonary neg pulmonary ROS, former smoker,    Pulmonary exam normal breath sounds clear to auscultation       Cardiovascular hypertension, Pt. on home beta blockers + Peripheral Vascular Disease  Normal cardiovascular exam Rhythm:Regular Rate:Normal     Neuro/Psych Seizures -,  PSYCHIATRIC DISORDERS Bipolar Disorder CVA    GI/Hepatic Neg liver ROS, GERD  Medicated,  Endo/Other  diabetes, Type 1  Renal/GU negative Renal ROS  negative genitourinary   Musculoskeletal negative musculoskeletal ROS (+)   Abdominal   Peds negative pediatric ROS (+)  Hematology negative hematology ROS (+)   Anesthesia Other Findings   Reproductive/Obstetrics negative OB ROS                            Anesthesia Physical  Anesthesia Plan  ASA: III  Anesthesia Plan: MAC and Regional   Post-op Pain Management:    Induction: Intravenous  PONV Risk Score and Plan: 2 and Ondansetron, Midazolam and Propofol infusion  Airway Management Planned: Natural Airway  Additional Equipment:   Intra-op Plan:   Post-operative Plan:   Informed Consent: I have reviewed the patients History and Physical, chart, labs and discussed the procedure including the risks, benefits and alternatives for the proposed anesthesia with the patient or authorized representative who has indicated his/her understanding and acceptance.     Dental advisory given  Plan Discussed with: CRNA, Anesthesiologist and Surgeon  Anesthesia Plan Comments:         Anesthesia Quick Evaluation

## 2019-07-03 NOTE — Anesthesia Procedure Notes (Signed)
Procedure Name: MAC Date/Time: 07/03/2019 10:05 AM Performed by: Candis Shine, CRNA Pre-anesthesia Checklist: Patient identified, Emergency Drugs available, Suction available, Patient being monitored and Timeout performed Patient Re-evaluated:Patient Re-evaluated prior to induction Oxygen Delivery Method: Simple face mask Dental Injury: Teeth and Oropharynx as per pre-operative assessment

## 2019-07-04 ENCOUNTER — Encounter: Payer: Self-pay | Admitting: *Deleted

## 2019-07-04 ENCOUNTER — Telehealth (HOSPITAL_COMMUNITY): Payer: Self-pay

## 2019-07-04 LAB — BASIC METABOLIC PANEL
Anion gap: 10 (ref 5–15)
BUN: 24 mg/dL — ABNORMAL HIGH (ref 6–20)
CO2: 27 mmol/L (ref 22–32)
Calcium: 8.5 mg/dL — ABNORMAL LOW (ref 8.9–10.3)
Chloride: 102 mmol/L (ref 98–111)
Creatinine, Ser: 0.8 mg/dL (ref 0.44–1.00)
GFR calc Af Amer: >60 mL/min (ref >60)
GFR calc non Af Amer: >60 mL/min (ref >60)
Glucose, Bld: 186 mg/dL — ABNORMAL HIGH (ref 70–99)
Potassium: 4.7 mmol/L (ref 3.5–5.1)
Sodium: 139 mmol/L (ref 135–145)

## 2019-07-04 LAB — HEMOGLOBIN AND HEMATOCRIT, BLOOD
HCT: 27.8 % — ABNORMAL LOW (ref 36.0–46.0)
Hemoglobin: 7.8 g/dL — ABNORMAL LOW (ref 12.0–15.0)

## 2019-07-04 LAB — GLUCOSE, CAPILLARY
Glucose-Capillary: 161 mg/dL — ABNORMAL HIGH (ref 70–99)
Glucose-Capillary: 192 mg/dL — ABNORMAL HIGH (ref 70–99)
Glucose-Capillary: 267 mg/dL — ABNORMAL HIGH (ref 70–99)
Glucose-Capillary: 143 mg/dL — ABNORMAL HIGH (ref 70–99)
Glucose-Capillary: 167 mg/dL — ABNORMAL HIGH (ref 70–99)

## 2019-07-04 MED ORDER — ACETAMINOPHEN 500 MG PO TABS
500.0000 mg | ORAL_TABLET | Freq: Four times a day (QID) | ORAL | Status: DC
  Administered 2019-07-04: 500 mg via ORAL
  Filled 2019-07-04 (×2): qty 1

## 2019-07-04 MED ORDER — HYDROMORPHONE HCL 1 MG/ML IJ SOLN
0.5000 mg | INTRAMUSCULAR | Status: DC | PRN
  Administered 2019-07-04 – 2019-07-05 (×6): 0.5 mg via INTRAVENOUS
  Filled 2019-07-04 (×6): qty 1

## 2019-07-04 MED ORDER — HYDROCODONE-ACETAMINOPHEN 5-325 MG PO TABS
2.0000 | ORAL_TABLET | ORAL | Status: DC | PRN
  Administered 2019-07-04 – 2019-07-05 (×6): 2 via ORAL
  Filled 2019-07-04 (×6): qty 2

## 2019-07-04 MED ORDER — IBUPROFEN 200 MG PO TABS
400.0000 mg | ORAL_TABLET | Freq: Three times a day (TID) | ORAL | Status: DC
  Administered 2019-07-04 – 2019-07-05 (×4): 400 mg via ORAL
  Filled 2019-07-04 (×4): qty 2

## 2019-07-04 NOTE — TOC Progression Note (Signed)
Transition of Care [facility]) - Progression Note    Patient Details  Name: Taylor [name] MRN: [id number] Date of Birth: 1980-09-26  Transition of Care [facility]) CM/SW Contact  Graves-Bigelow, Ocie Cornfield, RN Phone Number: 07/04/2019, 5:11 PM  Clinical Narrative:   Patient will have IV antibiotics in the home. [facility] to assist with the IV antibiotics. [facility] to work with the mother with antibiotic teaching. [facility] will assist with the PICC line care. Patient still needs home health Physical and Occupational Therapy. Several agencies called- unable to assist the patient due to Medicaid for insurance. Case Manager has call placed to [facility] to see if they can assist with therapy once patient is weight bearing. If the patient can not be set up with a home health agency- Case Manager will discuss with family regarding outpatient therapy- unsure if that is an option. Call placed to mother- left voicemail- awaiting call back. Patient will have [place] connected when she is medically stable to transition home. Case Manager will discuss with mother if the patient needs the durable medical equipment rolling walker and the 3n1. No further needs at this time.    Expected Discharge Plan: [place] Barriers to Discharge: No Barriers Identified  Expected Discharge Plan and Services Expected Discharge Plan: [place] In-house Referral: NA Discharge Planning Services: CM Consult Post Acute Care Choice: Home Health, Durable Medical Equipment Living arrangements for the past 2 months: Single Family Home       Social Determinants of Health (SDOH) Interventions    Readmission Risk Interventions No flowsheet data found.

## 2019-07-04 NOTE — Evaluation (Signed)
Occupational Therapy Evaluation Patient Details Name: Taylor [name] MRN: [id number] DOB: Feb 16, 1981 Today's Date: 07/04/2019    History of Present Illness Pt is a 39 y/o female transferred from [facility] for management of L foot osteomyelitis. Pt is s/p L 4th and 5th ray amputation. PMH includes PAD, HTN, DM, CVA.    Clinical Impression   Patient is a 39 year old female that lives with her parents in a single level home with 1 step to enter. Patient reports prior to hospitalization she was independent with self care. Currently, patient requires min A for functional transfers with max cues for technique. Patient unable to maintain NWB L foot during sit to stand from edge of bed and requires min A for safety when pivoting to bedside commode while attempting to keep L foot off the floor. Recommend continued acute OT services to maximize safety and independence with self care and functional transfer training.     Follow Up Recommendations  Home health OT;Supervision/Assistance - 24 hour    Equipment Recommendations  3 in 1 bedside commode;Other (comment)(rolling walker (father's is too tall))       Precautions / Restrictions Precautions Precautions: Fall Precaution Comments: Wound vac Required Braces or Orthoses: Other Brace Other Brace: L post op shoe Restrictions Weight Bearing Restrictions: Yes LLE Weight Bearing: Non weight bearing      Mobility Bed Mobility Overal bed mobility: Needs Assistance Bed Mobility: Supine to Sit;Sit to Supine     Supine to sit: Supervision Sit to supine: Supervision      Transfers Overall transfer level: Needs assistance Equipment used: None Transfers: Stand Pivot Transfers   Stand pivot transfers: Min assist       General transfer comment: min A for steadying assist and for safety. Provided verbal cues to perform pivot transfer while maintaining NWB however patient unable to maintain precautions.    Balance Overall balance  assessment: Needs assistance Sitting-balance support: No upper extremity supported;Feet supported Sitting balance-Leahy Scale: Good     Standing balance support: During functional activity Standing balance-Leahy Scale: Poor Standing balance comment: min A for safety                           ADL either performed or assessed with clinical judgement   ADL Overall ADL's : Needs assistance/impaired Eating/Feeding: Independent   Grooming: Set up;Sitting   Upper Body Bathing: Set up;Sitting   Lower Body Bathing: Minimal assistance;Sitting/lateral leans;Sit to/from stand   Upper Body Dressing : Set up;Sitting   Lower Body Dressing: Minimal assistance;Sitting/lateral leans   Toilet Transfer: Minimal assistance;Stand-pivot;Cueing for safety;Cueing for sequencing;BSC Toilet Transfer Details (indicate cue type and reason): decreased safety with body mechanics, cannot maintain NWB L foot Toileting- Clothing Manipulation and Hygiene: Min guard;Sit to/from stand;Cueing for safety Toileting - Clothing Manipulation Details (indicate cue type and reason): patient kneels onto edge of bed in order to perform peri care     Functional mobility during ADLs: Minimal assistance;Cueing for safety;Cueing for sequencing General ADL Comments: patient requires increased assistance with functional transfers and LB ADLs due to weight bearing status, decreased safety awareness.                   Pertinent Vitals/Pain Pain Assessment: 0-10 Pain Score: 7  Pain Location: L foot  Pain Descriptors / Indicators: Grimacing;Operative site guarding;Aching Pain Intervention(s): Limited activity within patient's tolerance;Repositioned;Patient requesting pain meds-RN notified     Hand Dominance (patient states left handed but "  I mostly use my right")   Extremity/Trunk Assessment Upper Extremity Assessment Upper Extremity Assessment: Overall WFL for tasks assessed   Lower Extremity  Assessment Lower Extremity Assessment: Defer to PT evaluation   Cervical / Trunk Assessment Cervical / Trunk Assessment: Normal   Communication Communication Communication: No difficulties   Cognition Arousal/Alertness: Awake/alert Behavior During Therapy: Anxious;Impulsive Overall Cognitive Status: No family/caregiver present to determine baseline cognitive functioning                                 General Comments: patient tearful due to pain, decreased direction following               Home Living Family/patient expects to be discharged to:: Private residence Living Arrangements: Parent Available Help at Discharge: Family;Available 24 hours/day Type of Home: House Home Access: Stairs to enter [employer] of Steps: 1 porch step Entrance Stairs-Rails: Right Home Layout: One level     Bathroom Shower/Tub: Teacher, early years/pre: Standard     Home Equipment: Shower seat;Wheelchair - manual          Prior Functioning/Environment Level of Independence: Independent                 OT Problem List: Decreased activity tolerance;Impaired balance (sitting and/or standing);Decreased safety awareness;Pain      OT Treatment/Interventions: Self-care/ADL training;Therapeutic exercise;Energy conservation;DME and/or AE instruction;Therapeutic activities;Patient/family education;Balance training    OT Goals(Current goals can be found in the care plan section) Acute Rehab OT Goals Patient Stated Goal: to get better and get out of the hospital  OT Goal Formulation: With patient Time For Goal Achievement: 07/18/19 Potential to Achieve Goals: Good  OT Frequency: Min 2X/week   Barriers to D/C: Inaccessible home environment  1 step to enter home, patient having difficulty maintaining NWB for transfers          AM-PAC OT "6 Clicks" Daily Activity     Outcome Measure Help from another person eating meals?: None Help from another  person taking care of personal grooming?: A Little Help from another person toileting, which includes using toliet, bedpan, or urinal?: A Little Help from another person bathing (including washing, rinsing, drying)?: A Little Help from another person to put on and taking off regular upper body clothing?: A Little Help from another person to put on and taking off regular lower body clothing?: A Little 6 Click Score: 19   End of Session Nurse Communication: Mobility status;Patient requests pain meds  Activity Tolerance: Patient limited by pain Patient left: in bed;with call bell/phone within reach;with bed alarm set  OT Visit Diagnosis: Other abnormalities of gait and mobility (R26.89);Pain Pain - Right/Left: Left Pain - part of body: Ankle and joints of foot                Time: [id number] OT Time Calculation (min): 19 min Charges:  OT General Charges $OT Visit: 1 Visit OT Evaluation $OT Eval Moderate Complexity: [place] OT OT office: [place] 07/04/2019, 1:18 PM

## 2019-07-04 NOTE — Progress Notes (Signed)
Physical Therapy Treatment Patient Details Name: Taylor [name] MRN: [id number] DOB: 03-Jan-1981 Today's Date: 07/04/2019    History of Present Illness Pt is a 39 y/o female transferred from [facility] for management of L foot osteomyelitis. Pt is s/p L 4th and 5th ray amputation. PMH includes PAD, HTN, DM, CVA.     PT Comments    Pt sitting EOB with RN and NT present upon arrival of PT, pt agreeable to PT session at this time with focus on progression of functional transfers and maintenance of NWB LLE. The pt continues to present with limitations in functional mobility and stability compared to her prior level of function and independence due to above dx and subsequent pain. Pt demos continued difficulty with maintaining NWB in sit-stand transfer despite repetition of verbal and tactile cues, but was able to maintain NWB with lateral hops to transfer from bed to recliner. The pt was tearful and frustrated through PT session due to pain, and demoed multiple lapses in [facility] regarding pain medicine delivery and food ordering (both occurred at beginning of session and pt was unaware by end of session). The pt will continue to benefit from skilled PT to maximize safety with mobility and return to prior level of function.     Follow Up Recommendations  Home health PT;Supervision/Assistance - 24 hour     Equipment Recommendations  Wheelchair (measurements PT);Wheelchair cushion (measurements PT);Rolling walker with 5" wheels;3in1 (PT)    Recommendations for Other Services       Precautions / Restrictions Precautions Precautions: Fall Precaution Comments: Wound vac LLE Required Braces or Orthoses: Other Brace Other Brace: L post op shoe Restrictions Weight Bearing Restrictions: Yes LLE Weight Bearing: Non weight bearing    Mobility  Bed Mobility Overal bed mobility: Needs Assistance Bed Mobility: Supine to Sit;Sit to Supine     Supine to sit: Supervision Sit to supine: Supervision    General bed mobility comments: pt sitting EOB upon PT arrival  Transfers Overall transfer level: Needs assistance Equipment used: Rolling walker (2 wheeled) Transfers: Stand Pivot Transfers;Sit to/from Stand Sit to Stand: Min assist Stand pivot transfers: Min assist       General transfer comment: minA for safety and stability, pt initiatlly unable to maintain NWB despite verbal and tactile cues. Pt initially gave up on transfer stating that she can't do it. After some motivation and encouragement, pt was able to attempt x2 and was able to transfer to recliner using RW and hop, good maintenence of NWB LLE  Ambulation/Gait Ambulation/Gait assistance: Min guard Gait Distance (Feet): 3 Feet Assistive device: Rolling walker (2 wheeled)     Gait velocity interpretation: <1.31 ft/sec, indicative of household ambulator General Gait Details: swing-to pattern using hop with RW.   Stairs             Wheelchair Mobility    Modified Rankin (Stroke Patients Only)       Balance Overall balance assessment: Needs assistance Sitting-balance support: No upper extremity supported;Feet supported Sitting balance-Leahy Scale: Good Sitting balance - Comments: supervision   Standing balance support: During functional activity;Bilateral upper extremity supported Standing balance-Leahy Scale: Poor Standing balance comment: min A for safety                            Cognition Arousal/Alertness: Awake/alert Behavior During Therapy: Anxious;Impulsive Overall Cognitive Status: No family/caregiver present to determine baseline cognitive functioning  General Comments: patient tearful due to pain, decreased direction following. Multiple episodes of STM deficits, for example pt ordered tray and was given pain medications at beginning of PT session, tearful and crying at end of session due to concerns of not being given pain medications  or food. Pt very frustrated through session, fearful of falling.      Exercises      General Comments General comments (skin integrity, edema, etc.): Educated on WB status and importance of maintaining NWB. Pt initially unable to maintain NWB, given VC to kick LLE out in front of her and then she was able to hop laterally to transfer to recliner. The pt had multiple episodes of short term memory deficits and became tearful regarding pain med schedule, food delivery, and PT goals.      Pertinent Vitals/Pain Pain Assessment: Faces Pain Score: 7  Faces Pain Scale: Hurts even more Pain Location: L foot  Pain Descriptors / Indicators: Grimacing;Operative site guarding;Aching Pain Intervention(s): Monitored during session;Limited activity within patient's tolerance;Repositioned;Patient requesting pain meds-RN notified;RN gave pain meds during session    [place] expects to be discharged to:: Private residence Living Arrangements: Parent Available Help at Discharge: Family;Available 24 hours/day Type of Home: House Home Access: Stairs to enter Entrance Stairs-Rails: Right Home Layout: One level Home Equipment: Shower seat;Wheelchair - manual      Prior Function Level of Independence: Independent          PT Goals (current goals can now be found in the care plan section) Acute Rehab PT Goals Patient Stated Goal: to get better and get out of the hospital  PT Goal Formulation: With patient Potential to Achieve Goals: Good Additional Goals Additional Goal #1: Pt will perform WC mobility mod I >125' in order to ensure independence with mobility. Progress towards PT goals: Progressing toward goals    Frequency    Min 3X/week      PT Plan Current plan remains appropriate    Co-evaluation              AM-PAC PT "6 Clicks" Mobility   Outcome Measure  Help needed turning from your back to your side while in a flat bed without using bedrails?: A  Little Help needed moving from lying on your back to sitting on the side of a flat bed without using bedrails?: A Little Help needed moving to and from a bed to a chair (including a wheelchair)?: A Little Help needed standing up from a chair using your arms (e.g., wheelchair or bedside chair)?: A Little Help needed to walk in hospital room?: A Lot Help needed climbing 3-5 steps with a railing? : Total 6 Click Score: 15    End of Session Equipment Utilized During Treatment: Gait belt(wound vac LLE) Activity Tolerance: Patient limited by pain Patient left: with call bell/phone within reach;in chair;with chair alarm set Nurse Communication: Mobility status;Patient requests pain meds PT Visit Diagnosis: Difficulty in walking, not elsewhere classified (R26.2);Pain Pain - Right/Left: Left Pain - part of body: Ankle and joints of foot     Time: [id number] PT Time Calculation (min) (ACUTE ONLY): 36 min  Charges:  $Gait Training: 8-22 mins $Therapeutic Activity: 8-22 mins                     Karma Ganja, PT, DPT   Acute Rehabilitation Department Pager #: [phone]   Otho Bellows 07/04/2019, 2:57 PM

## 2019-07-04 NOTE — Progress Notes (Signed)
PROGRESS NOTE    ERIEL SCHMALL  [id number] DOB: Apr 19, 1981 DOA: 06/23/2019 PCP: Monico Blitz, MD    Brief Narrative:  39 year old female who was transferred from [facility] for further management of left foot osteomyelitis with necrotic ulcer in the setting of peripheral vascular disease.  She does have the significant past medical history of type 2 diabetes mellitus, hypertension, dyslipidemia, seizures and prior CVA. She was hospitalized at [facility] from December 30 to January 10 for left foot infection with cellulitis and abscess, complicated with left fifth toe osteomyelitis and necrotic ulcer.  Patient underwent incision and drainage January 2.  Further work-up with ABIs showed moderate arterial occlusion in the left lower extremity.  Left lower extremity MRI showed osteomyelitis of the fifth toe.  Cultures were positive for Staphylococcus, a collection aureus.  Patient received antibiotic therapy with Zosyn and clindamycin.  Due to peripheral vascular disease worsening foot infection patient was transferred to [facility] for vascular surgery evaluation.  Patient underwent revascularization of her left lower extremity, subsequently followed by third fourth toe amputation along with debridement and application of skin graft.  Her antibiotics were narrowed to cefazolin along with metronidazole.  Plan to continue antibiotic for total of 6 weeks, end date August 06, 2019.  Assessment & Plan:   Principal Problem:   Osteomyelitis of left foot (HCC) Active Problems:   Diabetic foot infection ([place])   PAD (peripheral artery disease) ([place])  1. Left foot osteomyelitis with gangrene, PVD, sp amputation 3 and 4 toe. Patient continue to have pain, will change acetaminophen to schedule dosing will add ibuprofen, continue with oxycodone and increase frequency of IV hydromorphone. Patient not able to do physical therapy due to pain. Will continue antibiotic  therapy for 6 weeks.   2. Uncontrolled T2DM, Will continue glucose control with insulin sliding scale and basal. Patient is tolerating po well.  3. Hx of CVA. No current deficit, will continue neuro checks per unit protocol.  4. HTN. Continue blood pressure control. For now holding on BP meds due to transitory hypotension.   5. Moderate calorie protein malnutrition. Continue with nutritional supplements.     DVT prophylaxis: enoxaparin   Code Status:  full Family Communication: no family at the bedside  Disposition Plan/ discharge barriers: pending pain control.    Nutrition Status: Nutrition Problem: Increased nutrient needs Etiology: wound healing Signs/Symptoms: estimated needs Interventions: MVI, Premier Protein, Juven     Skin Documentation: Pressure Injury 03/25/19 Foot (Active)  03/25/19 1400  Location: Foot  Location Orientation:   Staging:   Wound Description (Comments):   Present on Admission:      Consultants:   Vascular surgery  Orthopedics  Procedures:     Antimicrobials:       Subjective: Patient continue to have post op pain on her left foot, moderate to sever in intensity, worse with ambulation, radiated to the leg, sharp in nature. Mild improvement with analgesics. No nausea or vomiting, no dyspnea or chest pain.   Objective: Vitals:   07/03/19 1147 07/03/19 2230 07/03/19 2232 07/04/19 0516  BP:  (!) 93/57 101/70 90/60  Pulse:  88 84 83  Resp:    17  Temp: 97.7 F (36.5 C)   98 F (36.7 C)  TempSrc:    Oral  SpO2:  90% 93% 93%  Weight:      Height:        Intake/Output Summary (Last 24 hours) at 07/04/2019 1039 Last data filed at 07/03/2019  2200 Gross per 24 hour  Intake --  Output 275 ml  Net -275 ml   Filed Weights   07/02/19 0625 07/03/19 0606 07/03/19 0847  Weight: 91.8 kg 91.9 kg 91.9 kg    Examination:   General: Not in pain or dyspnea. Deconditioned  Neurology: Awake and alert, non focal  E ENT: mild pallor,  no icterus, oral mucosa moist Cardiovascular: No JVD. S1-S2 present, rhythmic, no gallops, rubs, or murmurs. No lower extremity edema. Pulmonary: positive breath sounds bilaterally, adequate air movement, no wheezing, rhonchi or rales. Gastrointestinal. Abdomen with no organomegaly, non tender, no rebound or guarding Skin. No rashes Musculoskeletal: no joint deformities/ left foot with dressing in place, wound vac in place, no local edema beyond dressing.      Data Reviewed: I have personally reviewed following labs and imaging studies  CBC: Recent Labs  Lab 06/28/19 1018 07/01/19 0538 07/03/19 1545 07/04/19 0442  WBC 9.6 8.4  --   --   HGB 8.9* 9.2* 7.9* 7.8*  HCT 31.5* 32.6* 28.9* 27.8*  MCV 79.3* 78.2*  --   --   PLT 413* 408*  --   --    Basic Metabolic Panel: Recent Labs  Lab 07/01/19 0538 07/03/19 1545 07/04/19 0442  NA 140 137 139  K 4.8 4.5 4.7  CL 102 102 102  CO2 27 27 27   GLUCOSE 161* 298* 186*  BUN 15 18 24*  CREATININE 0.94 1.05* 0.80  CALCIUM 9.3 8.4* 8.5*   GFR: Estimated Creatinine Clearance: 103.7 mL/min (by C-G formula based on SCr of 0.8 mg/dL). Liver Function Tests: Recent Labs  Lab 07/01/19 0538  AST 27  ALT 14  ALKPHOS 74  BILITOT 0.5  PROT 6.5  ALBUMIN 2.9*   No results for input(s): LIPASE, AMYLASE in the last 168 hours. No results for input(s): AMMONIA in the last 168 hours. Coagulation Profile: No results for input(s): INR, PROTIME in the last 168 hours. Cardiac Enzymes: No results for input(s): CKTOTAL, CKMB, CKMBINDEX, TROPONINI in the last 168 hours. BNP (last 3 results) No results for input(s): PROBNP in the last 8760 hours. HbA1C: No results for input(s): HGBA1C in the last 72 hours. CBG: Recent Labs  Lab 07/03/19 1145 07/03/19 1640 07/03/19 2134 07/04/19 0510 07/04/19 0734  GLUCAP 127* 271* 159* 161* 192*   Lipid Profile: No results for input(s): CHOL, HDL, LDLCALC, TRIG, CHOLHDL, LDLDIRECT in the last 72  hours. Thyroid Function Tests: No results for input(s): TSH, T4TOTAL, FREET4, T3FREE, THYROIDAB in the last 72 hours. Anemia Panel: No results for input(s): VITAMINB12, FOLATE, FERRITIN, TIBC, IRON, RETICCTPCT in the last 72 hours.    Radiology Studies: I have reviewed all of the imaging during this hospital visit personally     Scheduled Meds: . aspirin EC  81 mg Oral Daily  . atorvastatin  40 mg Oral q1800  . canagliflozin  100 mg Oral QAC breakfast  . Chlorhexidine Gluconate Cloth  6 each Topical Daily  . clopidogrel  75 mg Oral Q breakfast  . diazepam  5 mg Oral BID  . docusate sodium  100 mg Oral BID  . DULoxetine  30 mg Oral Daily  . enoxaparin (LOVENOX) injection  40 mg Subcutaneous Daily  . gabapentin  900 mg Oral TID  . insulin aspart  0-15 Units Subcutaneous TID WC  . insulin aspart  0-5 Units Subcutaneous QHS  . insulin detemir  40 Units Subcutaneous QHS  . levETIRAcetam  500 mg Oral BID  . metoprolol  succinate  25 mg Oral Daily  . metroNIDAZOLE  500 mg Oral Q8H  . multivitamin with minerals  1 tablet Oral Daily  . nystatin cream   Topical BID  . pantoprazole  40 mg Oral Daily  . polyethylene glycol  17 g Oral Daily  . Ensure Max Protein  11 oz Oral QHS  . risperiDONE  0.5 mg Oral QHS  . sodium chloride flush  10-40 mL Intracatheter Q12H  . sodium chloride flush  3 mL Intravenous Q12H  . traZODone  200 mg Oral QHS   Continuous Infusions: . sodium chloride 100 mL/hr at 07/03/19 0300  . sodium chloride    . sodium chloride 75 mL/hr at 07/04/19 0200  .  ceFAZolin (ANCEF) IV 2 g (07/04/19 0524)  . lactated ringers 10 mL/hr at 07/03/19 0844     LOS: 11 days        Dajai Wahlert Gerome Apley, MD

## 2019-07-04 NOTE — Progress Notes (Signed)
POD#1 Lateral ray amputations  VSS.  75 cc output in vac. Alert awake .  Discussed with patient the importance of maintaining NWB to optimize healing as there was not a lot of robust bleeding during surgery. Encouraged that she has had some bloody output in drain

## 2019-07-04 NOTE — Progress Notes (Signed)
Inpatient Diabetes Program Recommendations  AACE/ADA: New Consensus Statement on Inpatient Glycemic Control (2015)  Target Ranges:  Prepandial:   less than 140 mg/dL      Peak postprandial:   less than 180 mg/dL (1-2 hours)      Critically ill patients:  140 - 180 mg/dL   Lab Results  Component Value Date   GLUCAP 267 (H) 07/04/2019   HGBA1C 10.0 (H) 06/23/2019    Review of Glycemic Control Results for Taylor [name], Taylor [name] (MRN [id number]) as of 07/04/2019 13:42  Ref. Range 07/03/2019 16:40 07/03/2019 21:34 07/04/2019 05:10 07/04/2019 07:34 07/04/2019 11:39  Glucose-Capillary Latest Ref Range: 70 - 99 mg/dL 271 (H) 159 (H) 161 (H) 192 (H) 267 (H)   Diabetes history:DM 2 Outpatient Diabetes medications:Farxiga 10 mg Daily, Metformin 500 mg bid Current orders for Inpatient glycemic control: Levemir 40 units qhs Novolog 0-15 units tid + hs  Inpatient Diabetes Program Recommendations:   Most postprandial CBGs elevated. Consider adding: -Novolog 3 units tid meal coverage if eats 50%  Thank you, Bethena Roys E. Malajah Oceguera, RN, MSN, CDE  Diabetes Coordinator Inpatient Glycemic Control Team Team Pager [phone] (8am-5pm) 07/04/2019 1:45 PM

## 2019-07-04 NOTE — Telephone Encounter (Signed)
Received a call from patient's mother this morning. She was upset that she has not received a phone call from the surgeon since patient's surgery yesterday. She said she would like to know how the surgery went and what the current plan of care is in regards to transitioning patient off IV pain meds to PO meds so that she may be eligible to discharge home under her mother's care. Navigator was able to answer questions that mother had about notes she has read in My Chart but mother would still like to speak with surgeon and CM about goals of care and discharge planning.Mother states patient has short term memory issues and is unable to recall information. She asks that staff please call her with information/questions at [phone].  Navigator will notify CM and surgeon regarding her requests.  Cletis Media RN BSN CWS [place] [phone]

## 2019-07-05 DIAGNOSIS — F329 Major depressive disorder, single episode, unspecified: Secondary | ICD-10-CM

## 2019-07-05 DIAGNOSIS — E1100 Type 2 diabetes mellitus with hyperosmolarity without nonketotic hyperglycemic-hyperosmolar coma (NKHHC): Secondary | ICD-10-CM

## 2019-07-05 DIAGNOSIS — F3170 Bipolar disorder, currently in remission, most recent episode unspecified: Secondary | ICD-10-CM

## 2019-07-05 DIAGNOSIS — I6350 Cerebral infarction due to unspecified occlusion or stenosis of unspecified cerebral artery: Secondary | ICD-10-CM

## 2019-07-05 DIAGNOSIS — I639 Cerebral infarction, unspecified: Secondary | ICD-10-CM

## 2019-07-05 LAB — HEMOGLOBIN AND HEMATOCRIT, BLOOD
HCT: 29.8 % — ABNORMAL LOW (ref 36.0–46.0)
Hemoglobin: 8.2 g/dL — ABNORMAL LOW (ref 12.0–15.0)

## 2019-07-05 LAB — GLUCOSE, CAPILLARY
Glucose-Capillary: 257 mg/dL — ABNORMAL HIGH (ref 70–99)
Glucose-Capillary: 209 mg/dL — ABNORMAL HIGH (ref 70–99)

## 2019-07-05 MED ORDER — IBUPROFEN 400 MG PO TABS: 400.0000 mg | ORAL_TABLET | Freq: Three times a day (TID) | ORAL | 0 refills | Status: DC | PRN

## 2019-07-05 MED ORDER — HYDROCODONE-ACETAMINOPHEN 5-325 MG PO TABS: 1.0000 | ORAL_TABLET | Freq: Four times a day (QID) | ORAL | 0 refills | Status: DC | PRN

## 2019-07-05 MED ORDER — CEFAZOLIN IV (FOR PTA / DISCHARGE USE ONLY): 2.0000 g | Freq: Three times a day (TID) | INTRAVENOUS | 0 refills | Status: DC

## 2019-07-05 MED ORDER — METRONIDAZOLE 500 MG PO TABS: 500.0000 mg | ORAL_TABLET | Freq: Three times a day (TID) | ORAL | 0 refills | Status: DC

## 2019-07-05 MED ORDER — BLOOD GLUCOSE METER KIT: PACK | 0 refills | Status: AC

## 2019-07-05 MED ORDER — PANTOPRAZOLE SODIUM 40 MG PO TBEC: 40.0000 mg | DELAYED_RELEASE_TABLET | Freq: Every day | ORAL | 0 refills | Status: DC

## 2019-07-05 MED ORDER — DOCUSATE SODIUM 100 MG PO CAPS: 100.0000 mg | ORAL_CAPSULE | Freq: Two times a day (BID) | ORAL | 0 refills | Status: DC

## 2019-07-05 NOTE — Progress Notes (Signed)
PT Cancellation Note  Patient Details Name: Taylor [name] MRN: [id number] DOB: 1980-09-17   Cancelled Treatment:    Reason Eval/Treat Not Completed: Other (comment). Per discussion with RN patient preparing to discharge at this time.   Zenaida Niece 07/05/2019, 4:25 PM

## 2019-07-05 NOTE — TOC Transition Note (Signed)
Transition of Care [facility]) - CM/SW Discharge Note   Patient Details  Name: Taylor [name] MRN: [id number] Date of Birth: 1981/01/11  [facility] CM/SW Contact:  Bethena Roys, RN Phone Number: 07/05/2019, 1:10 PM   Clinical Narrative:  Case Manager with the help of others was able to get the patient established with [facility] in [place]. Physical and Occupational Therapy will see the patient once she has her follow up with Dr. Jess Barters office. Mother will have antibiotic teaching today in the room before transition home. Durable Medical Equipment will be delivered to the room today. No further needs from Case Manager at this time.   Final next level of care: [place] Barriers to Discharge: No Barriers Identified   Patient Goals and CMS Choice Patient states their goals for this hospitalization and ongoing recovery are:: "to return home"     Discharge Plan and Services In-house Referral: NA Discharge Planning Services: CM Consult Post Acute Care Choice: Home Health, Durable Medical Equipment          DME Arranged: 3-N-1, [place] rolling DME Agency: [employer] Date DME Agency Contacted: 07/05/19 Time DME Agency Contacted: 1000 Representative spoke with at DME Agency: [place]: OT, PT [place] Agency: [place] ([place]) Date [place]: 07/05/19 Time [place]: 55 Representative spoke with at [place]: [place]    Readmission Risk Interventions No flowsheet data found.

## 2019-07-05 NOTE — Progress Notes (Signed)
Patient preparing for discharge home with her mother today. Mother at home at present time preparing for arrival. I was able to connect with the mother via telephone. She says that she has spoken with the CM and was told that [place] has approved PT/OT services. She has an appointment this afternoon with person coming to teach antibiotic administration. She also reports DME will be available to her in patient's room once she arrives. Mother is aware of 1 week follow up appointments with Dr Sharol Given and her PCP. She is keep VAC dressing clean and dry until her follow up appointment. Encouraged mother to contact navigator is she does not receive a call in a few days from the office to set those appointments up. She already has an appointment with vascular on 07/22/19.  She denies additional questions or barriers at this time. She has my contact information should any needs arise.  Cletis Media RN BSN CWS [place] [phone]

## 2019-07-05 NOTE — Discharge Summary (Addendum)
Physician Discharge Summary  Taylor [name] LYY:[id number] DOB: 12-24-1980 DOA: 06/23/2019  PCP: Monico Blitz, MD  Admit date: 06/23/2019 Discharge date: 07/05/2019  Admitted From: Home  Disposition:  Home   Recommendations for Outpatient Follow-up and new medication changes:  1. Follow up with Dr. Manuella Ghazi in 7 days.  2. Follow up with Orthopedics Dr,. Duda in 7 days 3. Continue local wound care as outpatient. 4. Continue IV antibiotic therapy until August 06, 2019. 5. Added ibuprofen and oxycodone for pain control.   Home Health: Yes   Equipment/Devices: Home IV antibiotic therapy    Discharge Condition: stable  CODE STATUS: full  Diet recommendation: heart healthy and diabetic prudent.   Brief/Interim Summary: 39 year old female who was transferred from [facility] for further management of left foot osteomyelitis with necrotic ulcer in the setting of peripheral vascular disease.  She does have the significant past medical history of type 2 diabetes mellitus, hypertension, dyslipidemia, seizures and prior CVA. She was hospitalized at [facility] from December 30 to January 10 for left foot infection with cellulitis and abscess, complicated with left fifth toe osteomyelitis and necrotic ulcer.  Patient underwent incision and drainage January 2.  Further work-up with ABIs showed moderate arterial occlusion in the left lower extremity.  Left lower extremity MRI showed osteomyelitis of the fifth toe.  Cultures were positive for Staphylococcus aureus (MSSA) and streptococcus agalacitia.  Patient received antibiotic therapy with Zosyn and clindamycin.  Due to peripheral vascular disease and worsening foot infection patient was transferred to [facility] for vascular surgery evaluation and further management.  Patient underwent revascularization of her left lower extremity (atherectomy left superficial femoral and popliteal artery, drug-coated balloon angioplasty  left superficial femoral-popliteal artery), subsequently followed by 4th and 5th toe amputation along with debridement and application of skin graft.  Her antibiotics were narrowed to cefazolin along with metronidazole.  Plan to continue antibiotic for total of 6 weeks, end date August 06, 2019.  1.  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  Patient was admitted to the telemetry ward, she received broad-spectrum IV antibiotic, surgical consultation, left lower extremity revascularization and third/fourth toe amputation.  Further cultures remain no growth.  Infectious disease was consulted, decision was made to continue IV antibiotic therapy with cefazolin and metronidazole.  Patient will complete 6 weeks of therapy.  Continue outpatient follow-up of surgical wound.  Continue pain control with acetaminophen, ibuprofen and oxycodone.  Home health services.  2.  Uncontrolled type 2 diabetes mellitus, hemoglobin 10.0.  Patient received insulin therapy while hospitalized, including sliding scale and basal, 40 units Levemir daily.  At discharge patient will resume her home regimen of antihyperglycemic agents.  Continue capillary glucose monitoring, will need close follow-up.  3.  Hypertension.  Her blood pressure remained stable  4.  Moderate calorie protein malnutrition.  Continue nutritional supplements  5.  History of CVA.  Remained stable, no acute or chronic deficit, continue close blood pressure and glucose control.   6.  Obesity.  Calculated BMI 36.3. Will need out patient follow up.   7. Depression. Continue duloxetin, diazepam and trazodone.   Discharge Diagnoses:  Principal Problem:   Osteomyelitis of left foot ([place]) Active Problems:   Stroke ([place])   Bipolar affective disorder ([place])   Cerebral artery occlusion with cerebral infarction ([place])   Depressive disorder   Type II diabetes mellitus ([place])   Diabetic foot  infection ([place])   PAD (  peripheral artery disease) ([facility])   Obesity, Class III, BMI 40-49.9 (morbid obesity) ([place])    Discharge Instructions  Discharge Instructions    Diet - low sodium heart healthy   Complete by: As directed    Discharge instructions   Complete by: As directed    Follow up with home health services, outpatient antibiotic therapy and primary care as scheduled.   Home infusion instructions   Complete by: As directed    Instructions: Flushing of vascular access device: 0.9% NaCl pre/post medication administration and prn patency; Heparin 100 u/ml, 51m for implanted ports and Heparin 10u/ml, 510mfor all other central venous catheters.   Increase activity slowly   Complete by: As directed    Negative Pressure Wound Therapy - Incisional   Complete by: As directed    Show patient how to attach prevena vac     Allergies as of 07/05/2019      Reactions   Contrast Media [iodinated Diagnostic Agents] Other (See Comments)   Went into kidney failure    Amitriptyline Other (See Comments)   Unresponsive    Compazine Other (See Comments)   Alert but unable to move   Morphine And Related Other (See Comments)   Chest pain   Oxycodone Nausea And Vomiting   Severe n/v -  patient will not take oxycodone or percocet.   Prochlorperazine Other (See Comments)   Alert but unable to move   Promethazine Swelling   Lips swell   Sumatriptan Rash      Medication List    STOP taking these medications   omeprazole 20 MG capsule Commonly known as: PRILOSEC   predniSONE 50 MG tablet Commonly known as: DELTASONE     TAKE these medications   acetaminophen 500 MG tablet Commonly known as: TYLENOL Take 500 mg by mouth every 6 (six) hours as needed for headache (pain). pain   aspirin 325 MG tablet Take 325 mg by mouth daily.   atorvastatin 40 MG tablet Commonly known as: LIPITOR Take 40 mg by mouth daily.   blood glucose meter kit and supplies Dispense based on patient and  insurance preference. Use up to four times daily as directed. (FOR ICD-10 E10.9, E11.9).   ceFAZolin  IVPB Commonly known as: ANCEF Inject 2 g into the vein every 8 (eight) hours. Indication:  Osteomyelitis of left foot with MSSA/Group B Strep  Last Day of Therapy:  08/13/19 Labs - Once weekly:  CBC/D and BMP, Labs - Every other week:  ESR and CRP Please remove PIC line after completion of antibiotic therapy. Notes to patient: As directed per [place] clopidogrel 75 MG tablet Commonly known as: PLAVIX TAKE 1 TABLET BY MOUTH DAILY   diazepam 5 MG tablet Commonly known as: VALIUM Take 5 mg by mouth 2 (two) times daily.   diphenhydrAMINE 25 MG tablet Commonly known as: BENADRYL Take 50 mg by mouth See admin instructions. Take 2 tablets (50 mg) by mouth one hour prior to procedure involving contrast dye   docusate sodium 100 MG capsule Commonly known as: COLACE Take 1 capsule (100 mg total) by mouth 2 (two) times daily.   DULoxetine 30 MG capsule Commonly known as: CYMBALTA Take 30 mg by mouth daily.   Farxiga 10 MG Tabs tablet Generic drug: dapagliflozin propanediol Take 10 mg by mouth daily.   gabapentin 300 MG capsule Commonly known as: NEURONTIN TAKE THREE CAPSULES BY MOUTH EVERY MORNING and TAKE THREE CAPSULES BY MOUTH AT NOON and TAKE  FOUR CAPSULES BY MOUTH AT BEDTIME What changed:   how much to take  how to take this  when to take this  additional instructions   HYDROcodone-acetaminophen 5-325 MG tablet Commonly known as: NORCO/VICODIN Take 1 tablet by mouth every 6 (six) hours as needed for moderate pain.   ibuprofen 400 MG tablet Commonly known as: ADVIL Take 1 tablet (400 mg total) by mouth every 8 (eight) hours as needed for moderate pain. What changed:   medication strength  how much to take  when to take this  reasons to take this   labetalol 300 MG tablet Commonly known as: NORMODYNE Take 300 mg by mouth 2 (two) times daily.    levETIRAcetam 500 MG 24 hr tablet Commonly known as: KEPPRA XR Take 500 mg by mouth 2 (two) times daily.   lisinopril 40 MG tablet Commonly known as: ZESTRIL Take 40 mg by mouth daily.   metFORMIN 500 MG tablet Commonly known as: GLUCOPHAGE Take 500 mg by mouth 2 (two) times daily.   metroNIDAZOLE 500 MG tablet Commonly known as: FLAGYL Take 1 tablet (500 mg total) by mouth 3 (three) times daily. Last day of antibiotics: 08/13/19   pantoprazole 40 MG tablet Commonly known as: PROTONIX Take 1 tablet (40 mg total) by mouth daily. Start taking on: July 06, 2019   risperiDONE 0.5 MG tablet Commonly known as: RISPERDAL Take 0.5 mg by mouth at bedtime.   traZODone 100 MG tablet Commonly known as: DESYREL Take 200 mg by mouth at bedtime.            Home Infusion Instuctions  (From admission, onward)         Start     Ordered   07/05/19 0000  Home infusion instructions    Question:  Instructions  Answer:  Flushing of vascular access device: 0.9% NaCl pre/post medication administration and prn patency; Heparin 100 u/ml, 38m for implanted ports and Heparin 10u/ml, 555mfor all other central venous catheters.   07/05/19 10[place](From admission, onward)         Start     Ordered   07/05/19 1104  For home use only DME 3 n 1  Once     07/05/19 1103   07/05/19 1103  For home use only DME wheelchair cushion (seat and back)  Once     07/05/19 1103   07/05/19 1103  For home use only DME Walker  Once    Question:  Patient needs a walker to treat with the following condition  Answer:  Ambulatory dysfunction   07/05/19 1103         Follow-up Information    BrSerafina MitchellMD In 4 weeks.   Specialties: Vascular Surgery, Cardiology Why: Office will call you to arrange your appt (sent) Contact information: [phone]        DuNewt MinionMD In 1 week.   Specialty: Orthopedic  Surgery Contact information: [phone]        ShMonico BlitzMD Follow up in 1 week(s).   Specialty: Internal Medicine Contact information: [address] [place] [phone]        Allergies  Allergen Reactions  . Contrast Media [Iodinated Diagnostic Agents] Other (See Comments)    Went into kidney failure   . Amitriptyline Other (See Comments)  Unresponsive   . Compazine Other (See Comments)    Alert but unable to move  . Morphine And Related Other (See Comments)    Chest pain  . Oxycodone Nausea And Vomiting    Severe n/v -  patient will not take oxycodone or percocet.  . Prochlorperazine Other (See Comments)    Alert but unable to move  . Promethazine Swelling    Lips swell  . Sumatriptan Rash    Consultations:  Vascular surgery   Orthopedics   Procedures/Studies: MR FOOT LEFT W WO CONTRAST  Result Date: 06/26/2019 CLINICAL DATA:  Peripheral arterial disease, left foot infection with cellulitis abscess EXAM: MRI OF THE LEFT FOREFOOT WITHOUT AND WITH CONTRAST TECHNIQUE: Multiplanar, multisequence MR imaging of the left was performed both before and after administration of intravenous contrast. CONTRAST:  28m GADAVIST GADOBUTROL 1 MMOL/ML IV SOLN COMPARISON:  June 14, 2019 FINDINGS: The examination is limited due to patient motion. Bones/Joint/Cartilage Again noted is cortical irregularity with diffuse T1 hypointensity and T2 hyperintensity seen at the fifth metatarsal head, not significantly changed since the prior exam. There is increased T2 hyperintense signal seen throughout the remainder of the fifth metatarsal shaft. The remainder of the osseous structures appear to be grossly unremarkable. The joint spaces appear to be intact. Ligaments Limited due to patient motion, however the Lisfranc ligaments and collateral ligaments appear to be intact. Muscles and Tendons Diffusely increased signal seen throughout  the muscles surrounding the forefoot, likely due to denervation changes versus myositis. The flexor and extensor tendons appear to be intact. Soft tissues Overlying the lateral aspect of the fifth metatarsal head again noted is a focal area of ulceration with exposure of the underlying lateral aspect of the fifth metatarsal head. There is non loculated fluid seen within this region. Diffuse enhancement with skin thickening and subcutaneous edema seen. There is diffuse dorsal soft tissue edema. There is superficial ulceration also noted on the dorsum of the foot at the level of the third metatarsal which appears to be new from the prior exam, series 5, image 19. IMPRESSION: 1.  Limited examination due to patient motion. 2. Findings again suggestive of acute osteomyelitis involving the fifth metatarsal head with area of ulceration overlying the metatarsal head extending to the osseous surface. Non loculated fluid seen within this region likely phlegmon/sinus tract. 3. Reactive marrow seen throughout the remainder of the fifth metatarsal head. 4. Diffuse dorsal subcutaneous edema and cellulitis. 5. New superficial area of ulceration on the dorsum of the foot overlying the third metatarsal. 6. Diffuse signal changes throughout the muscles, likely due to denervation changes versus myositis. Electronically Signed   By: BPrudencio PairM.D.   On: 06/26/2019 23:18   PERIPHERAL VASCULAR CATHETERIZATION  Result Date: 06/25/2019 Patient name: SVIRA CHAPLINMRN: [id number]: [id number]: female 06/25/2019 Pre-operative Diagnosis: Left leg ulcer Post-operative diagnosis:  Same Surgeon:  WAnnamarie MajorProcedure Performed:  1.  Ultrasound-guided access, right femoral artery  2.  Abdominal aortogram  3.  Left lower extremity runoff  4.  Atherectomy, left superficial femoral and popliteal artery  5.  Drug-coated balloon angioplasty, left superficial femoral and popliteal artery  6.  Closure device, Mynx  7.  Conscious sedation,  98 minutes Indications: The patient has osteomyelitis to the left fifth toe and ulcer.  She is here for further evaluation Procedure:  The patient was identified in the holding area and taken to room 8.  The patient was then placed supine on the table and  prepped and draped in the usual sterile fashion.  A time out was called.  Conscious sedation was administered with the use of IV fentanyl and Versed under continuous physician and nurse monitoring.  Heart rate, blood pressure, and oxygen saturation were continuously monitored.  Total sedation time was 62mnutes.  Ultrasound was used to evaluate the right common femoral artery.  It was patent .  A digital ultrasound image was acquired.  A micropuncture needle was used to access the right common femoral artery under ultrasound guidance.  An 018 wire was advanced without resistance and a micropuncture sheath was placed.  The 018 wire was removed and a benson wire was placed.  The micropuncture sheath was exchanged for a 5 french sheath.  An omniflush catheter was advanced over the wire to the level of L-1.  An abdominal angiogram was obtained.  Next, using the omniflush catheter and a benson wire, the aortic bifurcation was crossed and the catheter was placed into theleft external iliac artery and left runoff was obtained.  Findings:  Aortogram: No significant renal artery stenosis.  The infrarenal abdominal aorta is widely patent without stenosis.  Bilateral common and external iliac arteries are widely patent.  Right Lower Extremity: Not evaluated due to contrast utilization  Left Lower Extremity: The left common femoral arteries widely patent.  There are 2 main profunda branches.  1 of which has a 90% stenosis.  The superficial femoral artery is patent however there is a 90% stenosis in its midportion.  The popliteal artery has a short segment occlusion with reconstitution of the popliteal artery at the level patella.  The below-knee popliteal arteries widely  patent with three-vessel runoff.  There does appear to be digital artery disease. Intervention: After the above images were acquired the decision made to proceed with intervention.  Over a 035 wire, a 7 French 45 cm Terumo sheath was advanced in the left external iliac artery.  Patient was fully heparinized.  Next using a 018 quick cross catheter and a V-18 wire, the lesion was successfully crossed.  A contrast injection was performed through the sheath which confirmed successful crossing of the lesion.  A 018 bare wire was then placed.  A large NAV-6 filter was then deployed in the below-knee popliteal artery.  I then used the jetstream atherectomy device and perform atherectomy of the superficial femoral and popliteal artery making 1 pass forward in 1 pass backwards.  Next, I used overlapping 4 mm Ranger balloons (6 x 200, 6 x 100) and performed balloon angioplasty of the treated segment for 3-minute inflations at nominal pressure.  Completion imaging revealed complete resolution of the stenosis with no change in runoff.  The filter was then retrieved and removed.  The long sheath was exchanged out for short 7 and a minx was used for closure. Impression:  #1  90% mid SFA lesion and occluded short segment popliteal artery successfully crossed and treated with jetstream atherectomy and drug-coated balloon angioplasty using a 4 mm balloon.  #2  90% profunda origin stenosis V. WAnnamarie Major M.D., FSugarland Rehab HospitalVascular and Vein Specialists of GNew CastleOffice: [phone]:  [phone] VAS [place]ABI WITH/WO TBI  Result Date: 06/24/2019 LOWER EXTREMITY DOPPLER STUDY Indications: Ulceration. High Risk Factors: Hypertension, Diabetes.  Comparison Study: no prior Performing Technologist: MAbram SanderRVS  Examination Guidelines: A complete evaluation includes at minimum, Doppler waveform signals and systolic blood pressure reading at the level of bilateral brachial, anterior tibial, and posterior tibial arteries, when  vessel segments  are accessible. Bilateral testing is considered an integral part of a complete examination. Photoelectric Plethysmograph (PPG) waveforms and toe systolic pressure readings are included as required and additional duplex testing as needed. Limited examinations for reoccurring indications may be performed as noted.  [name] Findings: +--------+------------------+-----+----------+--------+ Right   Rt Pressure (mmHg)IndexWaveform  Comment  +--------+------------------+-----+----------+--------+ [id number]                    triphasic          +--------+------------------+-----+----------+--------+ PTA     75                0.61 monophasic         +--------+------------------+-----+----------+--------+ DP      75                0.61 biphasic           +--------+------------------+-----+----------+--------+ +--------+------------------+-----+-------------------+-----------+ Left    Lt Pressure (mmHg)IndexWaveform           Comment     +--------+------------------+-----+-------------------+-----------+ [id number]                    triphasic                      +--------+------------------+-----+-------------------+-----------+ PTA                                               not audible +--------+------------------+-----+-------------------+-----------+ DP      56                0.46 dampened monophasic            +--------+------------------+-----+-------------------+-----------+ +-------+-----------+-----------+------------+------------+ [name]/TBIToday's ABIToday's TBIPrevious ABIPrevious TBI +-------+-----------+-----------+------------+------------+ Right  0.61                                           +-------+-----------+-----------+------------+------------+ Left   0.46                                           +-------+-----------+-----------+------------+------------+  Summary: Right: Resting right ankle-brachial index indicates  moderate right lower extremity arterial disease. Left: Resting left ankle-brachial index indicates severe left lower extremity arterial disease.  *See table(s) above for measurements and observations.  Electronically signed by Curt Jews MD on 06/24/2019 at 5:15:15 PM.   Final    [place] EKG SITE RITE  Result Date: 06/26/2019 If Site Rite image not attached, placement could not be confirmed due to current cardiac rhythm.     Procedures:  1. Procedure Performed:             1.  Ultrasound-guided access, right femoral artery             2.  Abdominal aortogram             3.  Left lower extremity runoff             4.  Atherectomy, left superficial femoral and popliteal artery             5.  Drug-coated balloon angioplasty, left superficial femoral and popliteal artery  6.  Closure device, Mynx             7.  Conscious sedation, 98 minutes1.   2. LEFT FOOT 4TH AND 5TH RAY AMPUTATION, APPLY SKIN GRAFT LEFT FOOT Apply cleanse choice wound VAC. Local tissue rearrangement for wound closure 5 x 10 cm.  Subjective: Patient is feeling well, pain is well controlled with analgesics, no nausea or vomiting, has been out of bed. No chest pain.  Discharge Exam: Vitals:   07/04/19 2147 07/05/19 0836  BP:  130/76  Pulse:  81  Resp: 20   Temp:    SpO2:     Vitals:   07/04/19 2113 07/04/19 2147 07/05/19 0300 07/05/19 0836  BP: 104/61   130/76  Pulse: 79   81  Resp:  20    Temp: 97.7 F (36.5 C)     TempSrc: Oral     SpO2: 96%     Weight:   96.1 kg   Height:        General: Not in pain or dyspnea.  Neurology: Awake and alert, non focal  E ENT: no pallor, no icterus, oral mucosa moist Cardiovascular: No JVD. S1-S2 present, rhythmic, no gallops, rubs, or murmurs. No lower extremity edema. Pulmonary: positive breath sounds bilaterally, adequate air movement, no wheezing, rhonchi or rales. Gastrointestinal. Abdomen with, no organomegaly, non tender, no rebound or guarding Skin.  No rashes Musculoskeletal: left foot with dressing and wound vac in place.    The results of significant diagnostics from this hospitalization (including imaging, microbiology, ancillary and laboratory) are listed below for reference.     Microbiology: No results found for this or any previous visit (from the past 240 hour(s)).   Labs: BNP (last 3 results) No results for input(s): BNP in the last 8760 hours. Basic Metabolic Panel: Recent Labs  Lab 07/01/19 0538 07/03/19 1545 07/04/19 0442  NA 140 137 139  K 4.8 4.5 4.7  CL 102 102 102  CO2 '27 27 27  '$ GLUCOSE 161* 298* 186*  BUN 15 18 24*  CREATININE 0.94 1.05* 0.80  CALCIUM 9.3 8.4* 8.5*   Liver Function Tests: Recent Labs  Lab 07/01/19 0538  AST 27  ALT 14  ALKPHOS 74  BILITOT 0.5  PROT 6.5  ALBUMIN 2.9*   No results for input(s): LIPASE, AMYLASE in the last 168 hours. No results for input(s): AMMONIA in the last 168 hours. CBC: Recent Labs  Lab 07/01/19 0538 07/03/19 1545 07/04/19 0442 07/05/19 0426  WBC 8.4  --   --   --   HGB 9.2* 7.9* 7.8* 8.2*  HCT 32.6* 28.9* 27.8* 29.8*  MCV 78.2*  --   --   --   PLT 408*  --   --   --    Cardiac Enzymes: No results for input(s): CKTOTAL, CKMB, CKMBINDEX, TROPONINI in the last 168 hours. BNP: Invalid input(s): POCBNP CBG: Recent Labs  Lab 07/04/19 0734 07/04/19 1139 07/04/19 1742 07/04/19 2109 07/05/19 0738  GLUCAP 192* 267* 143* 167* 257*   D-Dimer No results for input(s): DDIMER in the last 72 hours. Hgb A1c No results for input(s): HGBA1C in the last 72 hours. Lipid Profile No results for input(s): CHOL, HDL, LDLCALC, TRIG, CHOLHDL, LDLDIRECT in the last 72 hours. Thyroid function studies No results for input(s): TSH, T4TOTAL, T3FREE, THYROIDAB in the last 72 hours.  Invalid input(s): FREET3 Anemia work up No results for input(s): VITAMINB12, FOLATE, FERRITIN, TIBC, IRON, RETICCTPCT in the last 72 hours. Urinalysis  Component Value  Date/Time   COLORURINE YELLOW 03/25/2019 0813   APPEARANCEUR CLEAR 03/25/2019 0813   LABSPEC 1.032 (H) 03/25/2019 0813   PHURINE 5.0 03/25/2019 0813   GLUCOSEU >=500 (A) 03/25/2019 0813   HGBUR LARGE (A) 03/25/2019 0813   BILIRUBINUR NEGATIVE 03/25/2019 0813   KETONESUR NEGATIVE 03/25/2019 0813   PROTEINUR NEGATIVE 03/25/2019 0813   UROBILINOGEN 0.2 03/02/2012 1829   NITRITE NEGATIVE 03/25/2019 0813   LEUKOCYTESUR NEGATIVE 03/25/2019 0813   Sepsis Labs Invalid input(s): PROCALCITONIN,  WBC,  LACTICIDVEN Microbiology No results found for this or any previous visit (from the past 240 hour(s)).   Time coordinating discharge: 45 minutes  SIGNED:   Tawni Millers, MD  [facility] 07/05/2019, 10:40 AM

## 2019-07-08 ENCOUNTER — Ambulatory Visit (INDEPENDENT_AMBULATORY_CARE_PROVIDER_SITE_OTHER): Payer: Medicaid Other | Admitting: Orthopedic Surgery

## 2019-07-08 ENCOUNTER — Other Ambulatory Visit: Payer: Self-pay

## 2019-07-08 ENCOUNTER — Encounter: Payer: Self-pay | Admitting: Orthopedic Surgery

## 2019-07-08 VITALS — Ht 64.0 in | Wt 211.9 lb

## 2019-07-08 DIAGNOSIS — M86272 Subacute osteomyelitis, left ankle and foot: Secondary | ICD-10-CM

## 2019-07-08 MED ORDER — HYDROCODONE-ACETAMINOPHEN 5-325 MG PO TABS: 1.0000 | ORAL_TABLET | Freq: Four times a day (QID) | ORAL | 0 refills | Status: DC | PRN

## 2019-07-08 MED ORDER — SILVER SULFADIAZINE 1 % EX CREA: 1.0000 "application " | TOPICAL_CREAM | Freq: Every day | CUTANEOUS | 0 refills | Status: DC

## 2019-07-08 NOTE — Progress Notes (Signed)
Office Visit Note   Patient: Taylor [name]           Date of Birth: 12-01-1980           MRN: [id number] Visit Date: 07/08/2019              Requested by: Monico Blitz, MD [address] [place],  [place] [address] PCP: Monico Blitz, MD  Chief Complaint  Patient presents with  . Left Foot - Routine Post Op    07/03/2019 left foot 4th & 5th amp      HPI: This is a pleasant 39 year old woman who is 5 days status post left fourth and fifth ray amputations with skin grafting.  Her mother is with her.  She states that the wound VAC began beeping prior to them leaving the hospital.  They were told to charge it.  They did this at home but the wound VAC never was functioning.  Overall she is doing well.  Her mother is also requesting a refill of hydrocodone as they were only given 10 tablets she is using them sparingly just 1 every 8 hours  Assessment & Plan: Visit Diagnoses: No diagnosis found.  Plan: They will do daily dressing changes.  I demonstrated this to them today with just a very small amount of Silvadene.  Follow-up in 1 week.  Follow-Up Instructions: No follow-ups on file.   Ortho Exam  Patient is alert, oriented, no adenopathy, well-dressed, normal affect, normal respiratory effort.  Focused examination of her foot skin graft and sponge is in place.  Healthy wound edges.  Swelling is well controlled.  Imaging: No results found. No images are attached to the encounter.  Labs: Lab Results  Component Value Date   HGBA1C 10.0 (H) 06/23/2019   ESRSEDRATE 96 (H) 06/23/2019   CRP 7.9 (H) 06/23/2019   REPTSTATUS 06/30/2019 FINAL 06/23/2019   CULT NO GROWTH 7 DAYS 06/23/2019     Lab Results  Component Value Date   ALBUMIN 2.9 (L) 07/01/2019   ALBUMIN 2.2 (L) 06/26/2019   ALBUMIN 3.6 03/02/2012   PREALBUMIN 17.3 (L) 06/23/2019    Lab Results  Component Value Date   MG 2.2 06/25/2019   MG 1.4 (L) 06/24/2019   No results found for: VD25OH  Lab Results  Component Value  Date   PREALBUMIN 17.3 (L) 06/23/2019   CBC EXTENDED Latest Ref Rng & Units 07/05/2019 07/04/2019 07/03/2019  WBC 4.0 - 10.5 K/uL - - -  RBC 3.87 - 5.11 MIL/uL - - -  HGB 12.0 - 15.0 g/dL 8.2(L) 7.8(L) 7.9(L)  HCT 36.0 - 46.0 % 29.8(L) 27.8(L) 28.9(L)  PLT 150 - 400 K/uL - - -  NEUTROABS 1.7 - 7.7 K/uL - - -  LYMPHSABS 0.7 - 4.0 K/uL - - -     Body mass index is 36.37 kg/m.  Orders:  No orders of the defined types were placed in this encounter.  Meds ordered this encounter  Medications  . HYDROcodone-acetaminophen (NORCO/VICODIN) 5-325 MG tablet    Sig: Take 1 tablet by mouth every 6 (six) hours as needed for moderate pain.    Dispense:  30 tablet    Refill:  0  . silver sulfADIAZINE (SILVADENE) 1 % cream    Sig: Apply 1 application topically daily.    Dispense:  50 g    Refill:  0     Procedures: No procedures performed  Clinical Data: No additional findings.  ROS:  All other systems negative, except as  noted in the HPI. Review of Systems  Objective: Vital Signs: Ht 5\' 4"  (1.626 m)   Wt 211 lb 14.4 oz (96.1 kg)   BMI 36.37 kg/m   Specialty Comments:  No specialty comments available.  PMFS History: Patient Active Problem List   Diagnosis Date Noted  . Obesity, Class III, BMI 40-49.9 (morbid obesity) ([place]) 07/05/2019  . Diabetic foot infection ([place]) 06/23/2019  . PAD (peripheral artery disease) ([place])   . Osteomyelitis of left foot ([place])   . Vertebral artery stenosis, left 03/25/2019  . VBI (vertebrobasilar insufficiency) 02/20/2019  . Intracranial carotid stenosis, left 01/23/2019  . 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 Tobacco use disorder 01/02/2015  . Cognitive impairment 01/02/2015  . Stroke ([place]) 09/07/2012  . Pain in upper limb 09/07/2012  . Pain in lower limb 09/07/2012  . Bipolar affective disorder ([place]) 09/07/2012  . Depressive disorder 09/07/2012  . Type II diabetes mellitus ([place])  09/07/2012  . Memory loss 09/07/2012  . 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 Personal history of noncompliance with medical treatment, presenting hazards to health 05/25/2012  . Cerebral artery occlusion with cerebral infarction ([place]) 05/10/2012   Past Medical History:  Diagnosis Date  . Anxiety   . ARF (acute renal failure) ([place]) 2013  . Bipolar disorder ([place])   . Cerebral artery occlusion with cerebral infarction ([place]) 11/13   due to elevated blood sugar 1700-off insulin 6 months  . Depression   . Diabetes mellitus    off insulin 6 months  . Diverticulitis   . Dyspnea    when walking  . GERD (gastroesophageal reflux disease)   . Headache(784.0)   . Hypertension   . Kidney dialysis status    on dialysis(stroke) off after 4-6 weeks  . Pneumonia 2013, 06/2018  . Respiratory failure ([place]) 11/13   with stroke - in hosp ncb[facility] 12 weeks  . Seizures ([place])    during elvated blood sugsr episode 11/13  . Sepsis ([place])   . Stroke ([place])    11/2018, 12/2018 Weak left side, speech- slurred, Short term memeroy, Gait unsteady   . Vocal cord dysfunction     Family History  Problem Relation Age of Onset  . Lung cancer Maternal Grandmother   . Heart attack Maternal Grandfather   . Diabetes Paternal Grandfather     Past Surgical History:  Procedure Laterality Date  . ABDOMINAL AORTOGRAM W/LOWER EXTREMITY N/A 06/25/2019   Procedure: ABDOMINAL AORTOGRAM W/LOWER EXTREMITY;  Surgeon: Serafina Mitchell, MD;  Location: [facility];  Service: Cardiovascular;  Laterality: N/A;  . AMPUTATION Left 07/03/2019   Procedure: LEFT FOOT 4TH AND 5TH RAY AMPUTATION;  Surgeon: Newt Minion, MD;  Location: [facility];  Service: Orthopedics;  Laterality: Left;  . APPENDECTOMY    . INSERTION OF DIALYSIS CATHETER  11/13   removed in 4-6 weeks  . IR ANGIO INTRA EXTRACRAN SEL COM CAROTID INNOMINATE BILAT MOD SED  02/07/2019  . IR ANGIO VERTEBRAL SEL VERTEBRAL BILAT MOD SED  02/07/2019  . IR ANGIOGRAM EXTREMITY LEFT   02/07/2019  . IR ANGIOGRAM EXTREMITY RIGHT  02/20/2019  . IR TRANSCATH EXCRAN VERT OR CAR A STENT  02/20/2019  . IR TRANSCATH EXCRAN VERT OR CAR A STENT  03/25/2019  . IR US GUIDE VASC ACCESS RIGHT  02/07/2019  . MULTIPLE EXTRACTIONS WITH ALVEOLOPLASTY N/A 02/24/2014   Procedure: MULTIPLE EXTRACTION WITH ALVEOLOPLASTY AND BIOPSY;  Surgeon: Gae Bon, DDS;  Location: [facility]  OR;  Service: Oral Surgery;  Laterality: N/A;  . PERIPHERAL VASCULAR ATHERECTOMY Left 06/25/2019   Procedure: PERIPHERAL VASCULAR ATHERECTOMY;  Surgeon: Serafina Mitchell, MD;  Location: [facility];  Service: Cardiovascular;  Laterality: Left;  superficial femoral  . RADIOLOGY WITH ANESTHESIA N/A 02/20/2019   Procedure: RADIOLOGY WITH ANESTHESIA   STENTING;  Surgeon: Luanne Bras, MD;  Location: [facility];  Service: Radiology;  Laterality: N/A;  . RADIOLOGY WITH ANESTHESIA N/A 03/25/2019   Procedure: RADIOLOGY WITH ANESTHESIA STENTING;  Surgeon: Luanne Bras, MD;  Location: [facility];  Service: Radiology;  Laterality: N/A;  . SKIN SPLIT GRAFT Left 07/03/2019   Procedure: APPLY SKIN GRAFT LEFT FOOT;  Surgeon: Newt Minion, MD;  Location: [facility];  Service: Orthopedics;  Laterality: Left;  . TRACHEOSTOMY  11/13   closed 1/14  . TRANSRECTAL DRAINAGE OF PELVIC ABSCESS  12/11   Social History   Occupational History  . Not on file  Tobacco Use  . Smoking status: Former Smoker    Packs/day: 0.60    Years: 23.00    Pack years: 13.80    Types: Cigarettes    Quit date: 02/05/2019    Years since quitting: 0.4  . Smokeless tobacco: Never Used  Substance and Sexual Activity  . Alcohol use: No    Alcohol/week: 0.0 standard drinks  . Drug use: No  . Sexual activity: Not Currently    Birth control/protection: None

## 2019-07-10 ENCOUNTER — Telehealth: Payer: Self-pay | Admitting: Orthopedic Surgery

## 2019-07-10 NOTE — Telephone Encounter (Signed)
Erline Levine from [facility] called in verbal orders for patient to have PT 2x a week for 4 weeks. Stacey phone number is [phone]. Marinell Blight detailed message if necessary.

## 2019-07-11 NOTE — Telephone Encounter (Signed)
Physical therapy was called and given verbal orders for services for patient.

## 2019-07-12 ENCOUNTER — Encounter: Payer: Self-pay | Admitting: Physician Assistant

## 2019-07-12 ENCOUNTER — Ambulatory Visit (INDEPENDENT_AMBULATORY_CARE_PROVIDER_SITE_OTHER): Payer: Medicaid Other | Admitting: Physician Assistant

## 2019-07-12 ENCOUNTER — Other Ambulatory Visit: Payer: Self-pay

## 2019-07-12 ENCOUNTER — Telehealth: Payer: Self-pay | Admitting: Orthopedic Surgery

## 2019-07-12 VITALS — Ht 64.0 in | Wt 211.9 lb

## 2019-07-12 DIAGNOSIS — M86272 Subacute osteomyelitis, left ankle and foot: Secondary | ICD-10-CM

## 2019-07-12 MED ORDER — HYDROCODONE-ACETAMINOPHEN 10-325 MG PO TABS: 1.0000 | ORAL_TABLET | Freq: Four times a day (QID) | ORAL | 0 refills | Status: DC | PRN

## 2019-07-12 MED ORDER — NITROGLYCERIN 0.2 MG/HR TD PT24: 0.2000 mg | MEDICATED_PATCH | Freq: Every day | TRANSDERMAL | 12 refills | Status: DC

## 2019-07-12 NOTE — Telephone Encounter (Signed)
Taylor [name] from [facility] [employer] called.  She is with patient right now and states that she is complaining of increased pain. Redness and swelling around sutures, along with some drainage. All this is new since her last visit at the home, per [facility]. Her temperature is 99.0. Please call and advise Taylor [name] [phone] or patient at [phone]

## 2019-07-12 NOTE — Progress Notes (Addendum)
Office Visit Note   Patient: Taylor [name]           Date of Birth: 07/20/1980           MRN: [id number] Visit Date: 07/12/2019              Requested by: Monico Blitz, MD [address] [place],  [place] [address] PCP: Monico Blitz, MD  Chief Complaint  Patient presents with  . Left Foot - Routine Post Op    07/03/2019 left foot 4th &5th ray      HPI: The patient resents today 11 days status post skin grafting to her left foot.  Her home health nurse is concerned because she seemed to have more pain today.  She is accompanied by her mother  Assessment & Plan: Visit Diagnoses: No diagnosis found.  Plan: I do have concerns as the graft is quite necrotic.  I did try to debride some of it but it is gray fibrinous tissue beneath.  It does not have a foul odor.  Also beneath her fourth and fifth toes there is some dark discoloration.  I will place her on a nitroglycerin patch.  She is already on IV antibiotics.  She will follow-up on Monday with Dr. Sharol Given.  Follow-Up Instructions: No follow-ups on file.   Ortho Exam  Patient is alert, oriented, no adenopathy, well-dressed, normal affect, normal respiratory effort.  Focused examination of her foot demonstrates some discoloration and necrosis underneath the fourth and fifth toes.  Also graft is necrotic there is mild surrounding erythema no fluctuance.  I did try to debride some tissue and there was gray fibrinous tissue beneath. She sis have a fairly strong biphasic pulse by doppler  Imaging: No results found.    Labs: Lab Results  Component Value Date   HGBA1C 10.0 (H) 06/23/2019   ESRSEDRATE 96 (H) 06/23/2019   CRP 7.9 (H) 06/23/2019   REPTSTATUS 06/30/2019 FINAL 06/23/2019   CULT NO GROWTH 7 DAYS 06/23/2019     Lab Results  Component Value Date   ALBUMIN 2.9 (L) 07/01/2019   ALBUMIN 2.2 (L) 06/26/2019   ALBUMIN 3.6 03/02/2012   PREALBUMIN 17.3 (L) 06/23/2019    Lab Results  Component Value Date   MG 2.2 06/25/2019   MG  1.4 (L) 06/24/2019   No results found for: VD25OH  Lab Results  Component Value Date   PREALBUMIN 17.3 (L) 06/23/2019   CBC EXTENDED Latest Ref Rng & Units 07/05/2019 07/04/2019 07/03/2019  WBC 4.0 - 10.5 K/uL - - -  RBC 3.87 - 5.11 MIL/uL - - -  HGB 12.0 - 15.0 g/dL 8.2(L) 7.8(L) 7.9(L)  HCT 36.0 - 46.0 % 29.8(L) 27.8(L) 28.9(L)  PLT 150 - 400 K/uL - - -  NEUTROABS 1.7 - 7.7 K/uL - - -  LYMPHSABS 0.7 - 4.0 K/uL - - -     Body mass index is 36.37 kg/m.  Orders:  No orders of the defined types were placed in this encounter.  Meds ordered this encounter  Medications  . nitroGLYCERIN (NITRODUR - DOSED IN MG/24 HR) 0.2 mg/hr patch    Sig: Place 1 patch (0.2 mg total) onto the skin daily. Place patch on foot in different spot daily    Dispense:  30 patch    Refill:  12  . HYDROcodone-acetaminophen (NORCO) 10-325 MG tablet    Sig: Take 1 tablet by mouth every 6 (six) hours as needed.    Dispense:  30 tablet  Refill:  0     Procedures: No procedures performed  Clinical Data: No additional findings.  ROS:  All other systems negative, except as noted in the HPI. Review of Systems  Objective: Vital Signs: Ht 5\' 4"  (1.626 m)   Wt 211 lb 14.4 oz (96.1 kg)   BMI 36.37 kg/m   Specialty Comments:  No specialty comments available.  PMFS History: Patient Active Problem List   Diagnosis Date Noted  . Obesity, Class III, BMI 40-49.9 (morbid obesity) ([place]) 07/05/2019  . Diabetic foot infection ([place]) 06/23/2019  . PAD (peripheral artery disease) ([place])   . Osteomyelitis of left foot ([place])   . Vertebral artery stenosis, left 03/25/2019  . VBI (vertebrobasilar insufficiency) 02/20/2019  . Intracranial carotid stenosis, left 01/23/2019  . 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 Tobacco use disorder 01/02/2015  . Cognitive impairment 01/02/2015  . Stroke ([place]) 09/07/2012  . Pain in upper limb 09/07/2012  . Pain in lower  limb 09/07/2012  . Bipolar affective disorder ([place]) 09/07/2012  . Depressive disorder 09/07/2012  . Type II diabetes mellitus ([place]) 09/07/2012  . Memory loss 09/07/2012  . 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 Personal history of noncompliance with medical treatment, presenting hazards to health 05/25/2012  . Cerebral artery occlusion with cerebral infarction ([place]) 05/10/2012   Past Medical History:  Diagnosis Date  . Anxiety   . ARF (acute renal failure) ([place]) 2013  . Bipolar disorder ([place])   . Cerebral artery occlusion with cerebral infarction ([place]) 11/13   due to elevated blood sugar 1700-off insulin 6 months  . Depression   . Diabetes mellitus    off insulin 6 months  . Diverticulitis   . Dyspnea    when walking  . GERD (gastroesophageal reflux disease)   . Headache(784.0)   . Hypertension   . Kidney dialysis status    on dialysis(stroke) off after 4-6 weeks  . Pneumonia 2013, 06/2018  . Respiratory failure ([place]) 11/13   with stroke - in hosp ncbh 12 weeks  . Seizures ([place])    during elvated blood sugsr episode 11/13  . Sepsis ([place])   . Stroke ([place])    11/2018, 12/2018 Weak left side, speech- slurred, Short term memeroy, Gait unsteady   . Vocal cord dysfunction     Family History  Problem Relation Age of Onset  . Lung cancer Maternal Grandmother   . Heart attack Maternal Grandfather   . Diabetes Paternal Grandfather     Past Surgical History:  Procedure Laterality Date  . ABDOMINAL AORTOGRAM W/LOWER EXTREMITY N/A 06/25/2019   Procedure: ABDOMINAL AORTOGRAM W/LOWER EXTREMITY;  Surgeon: Serafina Mitchell, MD;  Location: [facility];  Service: Cardiovascular;  Laterality: N/A;  . AMPUTATION Left 07/03/2019   Procedure: LEFT FOOT 4TH AND 5TH RAY AMPUTATION;  Surgeon: Newt Minion, MD;  Location: [facility];  Service: Orthopedics;  Laterality: Left;  . APPENDECTOMY    . INSERTION OF DIALYSIS CATHETER  11/13   removed in 4-6 weeks  . IR ANGIO INTRA EXTRACRAN SEL COM  CAROTID INNOMINATE BILAT MOD SED  02/07/2019  . IR ANGIO VERTEBRAL SEL VERTEBRAL BILAT MOD SED  02/07/2019  . IR ANGIOGRAM EXTREMITY LEFT  02/07/2019  . IR ANGIOGRAM EXTREMITY RIGHT  02/20/2019  . IR TRANSCATH EXCRAN VERT OR CAR A STENT  02/20/2019  . IR TRANSCATH EXCRAN VERT OR CAR A STENT  03/25/2019  . IR US GUIDE VASC ACCESS RIGHT  02/07/2019  .  MULTIPLE EXTRACTIONS WITH ALVEOLOPLASTY N/A 02/24/2014   Procedure: MULTIPLE EXTRACTION WITH ALVEOLOPLASTY AND BIOPSY;  Surgeon: Gae Bon, DDS;  Location: [facility];  Service: Oral Surgery;  Laterality: N/A;  . PERIPHERAL VASCULAR ATHERECTOMY Left 06/25/2019   Procedure: PERIPHERAL VASCULAR ATHERECTOMY;  Surgeon: Serafina Mitchell, MD;  Location: [facility];  Service: Cardiovascular;  Laterality: Left;  superficial femoral  . RADIOLOGY WITH ANESTHESIA N/A 02/20/2019   Procedure: RADIOLOGY WITH ANESTHESIA   STENTING;  Surgeon: Luanne Bras, MD;  Location: [facility];  Service: Radiology;  Laterality: N/A;  . RADIOLOGY WITH ANESTHESIA N/A 03/25/2019   Procedure: RADIOLOGY WITH ANESTHESIA STENTING;  Surgeon: Luanne Bras, MD;  Location: [facility];  Service: Radiology;  Laterality: N/A;  . SKIN SPLIT GRAFT Left 07/03/2019   Procedure: APPLY SKIN GRAFT LEFT FOOT;  Surgeon: Newt Minion, MD;  Location: [facility];  Service: Orthopedics;  Laterality: Left;  . TRACHEOSTOMY  11/13   closed 1/14  . TRANSRECTAL DRAINAGE OF PELVIC ABSCESS  12/11   Social History   Occupational History  . Not on file  Tobacco Use  . Smoking status: Former Smoker    Packs/day: 0.60    Years: 23.00    Pack years: 13.80    Types: Cigarettes    Quit date: 02/05/2019    Years since quitting: 0.4  . Smokeless tobacco: Never Used  Substance and Sexual Activity  . Alcohol use: No    Alcohol/week: 0.0 standard drinks  . Drug use: No  . Sexual activity: Not Currently    Birth control/protection: None

## 2019-07-12 NOTE — Telephone Encounter (Signed)
Home health was called and patient to advise on coming into the office for a follow up due to latest symptoms that has aroused today per patient's mother states. Patient noted redness, draining, edema and increased pain along her incision site. Patient mother states she will be into the office today.

## 2019-07-15 ENCOUNTER — Encounter: Payer: Self-pay | Admitting: Orthopedic Surgery

## 2019-07-15 ENCOUNTER — Ambulatory Visit (INDEPENDENT_AMBULATORY_CARE_PROVIDER_SITE_OTHER): Payer: Medicaid Other | Admitting: Orthopedic Surgery

## 2019-07-15 ENCOUNTER — Other Ambulatory Visit: Payer: Self-pay

## 2019-07-15 VITALS — Ht 64.0 in | Wt 211.9 lb

## 2019-07-15 DIAGNOSIS — Z945 Skin transplant status: Secondary | ICD-10-CM

## 2019-07-15 MED ORDER — SILVER SULFADIAZINE 1 % EX CREA: 1.0000 "application " | TOPICAL_CREAM | Freq: Every day | CUTANEOUS | 0 refills | Status: DC

## 2019-07-15 NOTE — Progress Notes (Signed)
Office Visit Note   Patient: Taylor [name]           Date of Birth: 17-Aug-1980           MRN: [id number] Visit Date: 07/15/2019              Requested by: Monico Blitz, MD [address] [place],  [place] [address] PCP: Monico Blitz, MD  Chief Complaint  Patient presents with  . Left Foot - Routine Post Op    07/03/2019 left foot 4th &5th ray      HPI: Patient is a 39 year old woman who is status post foot salvage intervention of the left foot she is status post fourth and fifth ray amputations and status post skin graft dorsally.  Patient states that she is doing well has no complaints she is currently receiving antibiotics through a PICC line.  Assessment & Plan: Visit Diagnoses:  1. S/P split thickness skin graft     Plan: Continue with Dial soap cleansing continue with Silvadene dressing changes.  Follow-Up Instructions: Return in about 2 weeks (around 07/29/2019).   Ortho Exam  Patient is alert, oriented, no adenopathy, well-dressed, normal affect, normal respiratory effort. Examination the skin graft is intact there is good granulation tissue around the edges.  The skin graft does have increased pigmentation compared to her normal skin.  There is a few areas of overlapping skin that is sloughing off and this was removed.  No cellulitis no odor no drainage no signs of infection.  Patient did have a blood blister on the plantar aspect of the foot this was removed and there was good healthy epithelial skin beneath the blister.  Imaging: No results found. No images are attached to the encounter.  Labs: Lab Results  Component Value Date   HGBA1C 10.0 (H) 06/23/2019   ESRSEDRATE 96 (H) 06/23/2019   CRP 7.9 (H) 06/23/2019   REPTSTATUS 06/30/2019 FINAL 06/23/2019   CULT NO GROWTH 7 DAYS 06/23/2019     Lab Results  Component Value Date   ALBUMIN 2.9 (L) 07/01/2019   ALBUMIN 2.2 (L) 06/26/2019   ALBUMIN 3.6 03/02/2012   PREALBUMIN 17.3 (L) 06/23/2019    Lab Results    Component Value Date   MG 2.2 06/25/2019   MG 1.4 (L) 06/24/2019   No results found for: VD25OH  Lab Results  Component Value Date   PREALBUMIN 17.3 (L) 06/23/2019   CBC EXTENDED Latest Ref Rng & Units 07/05/2019 07/04/2019 07/03/2019  WBC 4.0 - 10.5 K/uL - - -  RBC 3.87 - 5.11 MIL/uL - - -  HGB 12.0 - 15.0 g/dL 8.2(L) 7.8(L) 7.9(L)  HCT 36.0 - 46.0 % 29.8(L) 27.8(L) 28.9(L)  PLT 150 - 400 K/uL - - -  NEUTROABS 1.7 - 7.7 K/uL - - -  LYMPHSABS 0.7 - 4.0 K/uL - - -     Body mass index is 36.37 kg/m.  Orders:  No orders of the defined types were placed in this encounter.  Meds ordered this encounter  Medications  . silver sulfADIAZINE (SILVADENE) 1 % cream    Sig: Apply 1 application topically daily.    Dispense:  400 g    Refill:  0     Procedures: No procedures performed  Clinical Data: No additional findings.  ROS:  All other systems negative, except as noted in the HPI. Review of Systems  Objective: Vital Signs: Ht 5\' 4"  (1.626 m)   Wt 211 lb 14.4 oz (96.1 kg)   BMI  36.37 kg/m   Specialty Comments:  No specialty comments available.  PMFS History: Patient Active Problem List   Diagnosis Date Noted  . Obesity, Class III, BMI 40-49.9 (morbid obesity) ([place]) 07/05/2019  . Diabetic foot infection ([place]) 06/23/2019  . PAD (peripheral artery disease) ([place])   . Osteomyelitis of left foot ([place])   . Vertebral artery stenosis, left 03/25/2019  . VBI (vertebrobasilar insufficiency) 02/20/2019  . Intracranial carotid stenosis, left 01/23/2019  . 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 Tobacco use disorder 01/02/2015  . Cognitive impairment 01/02/2015  . Stroke ([place]) 09/07/2012  . Pain in upper limb 09/07/2012  . Pain in lower limb 09/07/2012  . Bipolar affective disorder ([place]) 09/07/2012  . Depressive disorder 09/07/2012  . Type II diabetes mellitus ([place]) 09/07/2012  . Memory loss 09/07/2012  . Central  pain syndrome 09/07/2012  . Personal history of noncompliance with medical treatment, presenting hazards to health 05/25/2012  . Cerebral artery occlusion with cerebral infarction ([place]) 05/10/2012   Past Medical History:  Diagnosis Date  . Anxiety   . ARF (acute renal failure) ([place]) 2013  . Bipolar disorder ([place])   . Cerebral artery occlusion with cerebral infarction ([place]) 11/13   due to elevated blood sugar 1700-off insulin 6 months  . Depression   . Diabetes mellitus    off insulin 6 months  . Diverticulitis   . Dyspnea    when walking  . GERD (gastroesophageal reflux disease)   . Headache(784.0)   . Hypertension   . Kidney dialysis status    on dialysis(stroke) off after 4-6 weeks  . Pneumonia 2013, 06/2018  . Respiratory failure ([place]) 11/13   with stroke - in hosp ncbh 12 weeks  . Seizures ([place])    during elvated blood sugsr episode 11/13  . Sepsis ([place])   . Stroke ([place])    11/2018, 12/2018 Weak left side, speech- slurred, Short term memeroy, Gait unsteady   . Vocal cord dysfunction     Family History  Problem Relation Age of Onset  . Lung cancer Maternal Grandmother   . Heart attack Maternal Grandfather   . Diabetes Paternal Grandfather     Past Surgical History:  Procedure Laterality Date  . ABDOMINAL AORTOGRAM W/LOWER EXTREMITY N/A 06/25/2019   Procedure: ABDOMINAL AORTOGRAM W/LOWER EXTREMITY;  Surgeon: Serafina Mitchell, MD;  Location: [facility];  Service: Cardiovascular;  Laterality: N/A;  . AMPUTATION Left 07/03/2019   Procedure: LEFT FOOT 4TH AND 5TH RAY AMPUTATION;  Surgeon: Newt Minion, MD;  Location: [facility];  Service: Orthopedics;  Laterality: Left;  . APPENDECTOMY    . INSERTION OF DIALYSIS CATHETER  11/13   removed in 4-6 weeks  . IR ANGIO INTRA EXTRACRAN SEL COM CAROTID INNOMINATE BILAT MOD SED  02/07/2019  . IR ANGIO VERTEBRAL SEL VERTEBRAL BILAT MOD SED  02/07/2019  . IR ANGIOGRAM EXTREMITY LEFT  02/07/2019  . IR ANGIOGRAM EXTREMITY RIGHT   02/20/2019  . IR TRANSCATH EXCRAN VERT OR CAR A STENT  02/20/2019  . IR TRANSCATH EXCRAN VERT OR CAR A STENT  03/25/2019  . IR US GUIDE VASC ACCESS RIGHT  02/07/2019  . MULTIPLE EXTRACTIONS WITH ALVEOLOPLASTY N/A 02/24/2014   Procedure: MULTIPLE EXTRACTION WITH ALVEOLOPLASTY AND BIOPSY;  Surgeon: Gae Bon, DDS;  Location: [facility];  Service: Oral Surgery;  Laterality: N/A;  . PERIPHERAL VASCULAR ATHERECTOMY Left 06/25/2019   Procedure: PERIPHERAL VASCULAR ATHERECTOMY;  Surgeon: Serafina Mitchell, MD;  Location: [facility];  Service: Cardiovascular;  Laterality: Left;  superficial femoral  . RADIOLOGY WITH ANESTHESIA N/A 02/20/2019   Procedure: RADIOLOGY WITH ANESTHESIA   STENTING;  Surgeon: Luanne Bras, MD;  Location: [facility];  Service: Radiology;  Laterality: N/A;  . RADIOLOGY WITH ANESTHESIA N/A 03/25/2019   Procedure: RADIOLOGY WITH ANESTHESIA STENTING;  Surgeon: Luanne Bras, MD;  Location: [facility];  Service: Radiology;  Laterality: N/A;  . SKIN SPLIT GRAFT Left 07/03/2019   Procedure: APPLY SKIN GRAFT LEFT FOOT;  Surgeon: Newt Minion, MD;  Location: [facility];  Service: Orthopedics;  Laterality: Left;  . TRACHEOSTOMY  11/13   closed 1/14  . TRANSRECTAL DRAINAGE OF PELVIC ABSCESS  12/11   Social History   Occupational History  . Not on file  Tobacco Use  . Smoking status: Former Smoker    Packs/day: 0.60    Years: 23.00    Pack years: 13.80    Types: Cigarettes    Quit date: 02/05/2019    Years since quitting: 0.4  . Smokeless tobacco: Never Used  Substance and Sexual Activity  . Alcohol use: No    Alcohol/week: 0.0 standard drinks  . Drug use: No  . Sexual activity: Not Currently    Birth control/protection: None

## 2019-07-18 ENCOUNTER — Telehealth: Payer: Self-pay

## 2019-07-18 ENCOUNTER — Encounter: Payer: Self-pay | Admitting: Orthopedic Surgery

## 2019-07-18 ENCOUNTER — Telehealth: Payer: Self-pay | Admitting: Orthopedic Surgery

## 2019-07-18 NOTE — Telephone Encounter (Signed)
Patient called.   She didn't disclose why but she is requesting a call from [facility]. She said [facility] will know why.   Call back number: [phone]

## 2019-07-18 NOTE — Telephone Encounter (Signed)
I called and sw pt's mother. She states that there is "something black " came off her foot she described it as dressing material. After review of the chart I see that we advised to wash area with soap and water and apply silvadene dressing. I am not sure what she is trying to describe. I asked for the pt to come into the office tomorrow for eval and due to other appointments pt's mother advised that they can not come in. I advised that she can unwrap the foot and take a picture to upload to mychart and we can look and then make an appt for next Monday. She will send photo tomorrow and we can make appt for follow up.

## 2019-07-18 NOTE — Telephone Encounter (Signed)
Patient's mother Lattie Haw called concerning dressing change for patient.  Would like a call back to discuss?  CB# [phone].  Please advise.  Thank you.

## 2019-07-18 NOTE — Telephone Encounter (Signed)
This has been done duplicate message.  °

## 2019-07-19 ENCOUNTER — Telehealth (HOSPITAL_COMMUNITY): Payer: Self-pay

## 2019-07-19 NOTE — Telephone Encounter (Signed)

## 2019-07-21 ENCOUNTER — Other Ambulatory Visit: Payer: Self-pay

## 2019-07-21 DIAGNOSIS — I739 Peripheral vascular disease, unspecified: Secondary | ICD-10-CM

## 2019-07-22 ENCOUNTER — Ambulatory Visit (INDEPENDENT_AMBULATORY_CARE_PROVIDER_SITE_OTHER): Payer: Medicaid Other | Admitting: Surgery

## 2019-07-22 ENCOUNTER — Ambulatory Visit (INDEPENDENT_AMBULATORY_CARE_PROVIDER_SITE_OTHER)
Admit: 2019-07-22 | Discharge: 2019-07-22 | Disposition: A | Discharge status: Home / Self Care | Payer: Medicaid Other | Attending: Surgery | Admitting: Surgery

## 2019-07-22 ENCOUNTER — Encounter: Payer: Self-pay | Admitting: Orthopedic Surgery

## 2019-07-22 ENCOUNTER — Encounter: Payer: Self-pay | Admitting: Surgery

## 2019-07-22 ENCOUNTER — Other Ambulatory Visit: Payer: Self-pay

## 2019-07-22 ENCOUNTER — Ambulatory Visit (HOSPITAL_COMMUNITY)
Admission: RE | Admit: 2019-07-22 | Discharge: 2019-07-22 | Disposition: A | Discharge status: Home / Self Care | Payer: Medicaid Other | Source: Ambulatory Visit | Attending: Surgery | Admitting: Surgery

## 2019-07-22 ENCOUNTER — Ambulatory Visit (INDEPENDENT_AMBULATORY_CARE_PROVIDER_SITE_OTHER): Payer: Medicaid Other | Admitting: Orthopedic Surgery

## 2019-07-22 VITALS — BP 117/78 | HR 89 | Temp 97.4°F | Resp 20 | Ht 64.0 in | Wt 197.0 lb

## 2019-07-22 VITALS — Ht 64.0 in | Wt 211.0 lb

## 2019-07-22 DIAGNOSIS — I70245 Atherosclerosis of native arteries of left leg with ulceration of other part of foot: Secondary | ICD-10-CM

## 2019-07-22 DIAGNOSIS — Z945 Skin transplant status: Secondary | ICD-10-CM

## 2019-07-22 DIAGNOSIS — I739 Peripheral vascular disease, unspecified: Secondary | ICD-10-CM | POA: Insufficient documentation

## 2019-07-22 MED ORDER — HYDROCODONE-ACETAMINOPHEN 10-325 MG PO TABS: 1.0000 | ORAL_TABLET | Freq: Four times a day (QID) | ORAL | 0 refills | Status: DC | PRN

## 2019-07-22 NOTE — Progress Notes (Signed)
Office Visit Note   Patient: Taylor [name]           Date of Birth: 1981/04/13           MRN: [id number] Visit Date: 07/22/2019              Requested by: Monico Blitz, MD [address] [place],  [place] [address] PCP: Monico Blitz, MD  Chief Complaint  Patient presents with  . Left Foot - Routine Post Op    07/03/19 left foot 4th and 5th ray amputation       HPI: Patient is a 39 year old woman who presents in follow-up status post lateral ray amputation of the left foot and split-thickness skin graft.  Patient is currently on antibiotics through a PICC line she has had some burning sensation from the nitroglycerin patch and has removed it once its been applied.  Currently doing Silvadene dressing changes and nonweightbearing.  Assessment & Plan: Visit Diagnoses:  1. S/P split thickness skin graft     Plan: Recommended elevation as well as range of motion of her ankle to help improve the circulation and decrease swelling.  Recommend that she try using a half a nitroglycerin patch to help with the microcirculation.  Continue Silvadene dressing changes a prescription for Vicodin is called in.  Follow-Up Instructions: Return in about 1 week (around 07/29/2019).   Ortho Exam  Patient is alert, oriented, no adenopathy, well-dressed, normal affect, normal respiratory effort. Examination the split-thickness skin graft is healthy and viable there is good granulation tissue in the wound bed.  The proximal incision is well approximated there is no cellulitis no odor no drainage no signs of infection.  Imaging: No results found. No images are attached to the encounter.  Labs: Lab Results  Component Value Date   HGBA1C 10.0 (H) 06/23/2019   ESRSEDRATE 96 (H) 06/23/2019   CRP 7.9 (H) 06/23/2019   REPTSTATUS 06/30/2019 FINAL 06/23/2019   CULT NO GROWTH 7 DAYS 06/23/2019     Lab Results  Component Value Date   ALBUMIN 2.9 (L) 07/01/2019   ALBUMIN 2.2 (L) 06/26/2019   ALBUMIN 3.6  03/02/2012   PREALBUMIN 17.3 (L) 06/23/2019    Lab Results  Component Value Date   MG 2.2 06/25/2019   MG 1.4 (L) 06/24/2019   No results found for: VD25OH  Lab Results  Component Value Date   PREALBUMIN 17.3 (L) 06/23/2019   CBC EXTENDED Latest Ref Rng & Units 07/05/2019 07/04/2019 07/03/2019  WBC 4.0 - 10.5 K/uL - - -  RBC 3.87 - 5.11 MIL/uL - - -  HGB 12.0 - 15.0 g/dL 8.2(L) 7.8(L) 7.9(L)  HCT 36.0 - 46.0 % 29.8(L) 27.8(L) 28.9(L)  PLT 150 - 400 K/uL - - -  NEUTROABS 1.7 - 7.7 K/uL - - -  LYMPHSABS 0.7 - 4.0 K/uL - - -     Body mass index is 36.22 kg/m.  Orders:  No orders of the defined types were placed in this encounter.  Meds ordered this encounter  Medications  . HYDROcodone-acetaminophen (NORCO) 10-325 MG tablet    Sig: Take 1 tablet by mouth every 6 (six) hours as needed.    Dispense:  30 tablet    Refill:  0     Procedures: No procedures performed  Clinical Data: No additional findings.  ROS:  All other systems negative, except as noted in the HPI. Review of Systems  Objective: Vital Signs: Ht 5\' 4"  (1.626 m)   Wt 211 lb (95.7  kg)   BMI 36.22 kg/m   Specialty Comments:  No specialty comments available.  PMFS History: Patient Active Problem List   Diagnosis Date Noted  . Obesity, Class III, BMI 40-49.9 (morbid obesity) ([place]) 07/05/2019  . Diabetic foot infection ([place]) 06/23/2019  . PAD (peripheral artery disease) ([place])   . Osteomyelitis of left foot ([place])   . Vertebral artery stenosis, left 03/25/2019  . VBI (vertebrobasilar insufficiency) 02/20/2019  . Intracranial carotid stenosis, left 01/23/2019  . 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 Tobacco use disorder 01/02/2015  . Cognitive impairment 01/02/2015  . Stroke ([place]) 09/07/2012  . Pain in upper limb 09/07/2012  . Pain in lower limb 09/07/2012  . Bipolar affective disorder ([place]) 09/07/2012  . Depressive disorder 09/07/2012  .  Type II diabetes mellitus ([place]) 09/07/2012  . Memory loss 09/07/2012  . 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 Personal history of noncompliance with medical treatment, presenting hazards to health 05/25/2012  . Cerebral artery occlusion with cerebral infarction ([place]) 05/10/2012   Past Medical History:  Diagnosis Date  . Anxiety   . ARF (acute renal failure) ([place]) 2013  . Bipolar disorder ([place])   . Cerebral artery occlusion with cerebral infarction ([place]) 11/13   due to elevated blood sugar 1700-off insulin 6 months  . Depression   . Diabetes mellitus    off insulin 6 months  . Diverticulitis   . Dyspnea    when walking  . GERD (gastroesophageal reflux disease)   . Headache(784.0)   . Hypertension   . Kidney dialysis status    on dialysis(stroke) off after 4-6 weeks  . Pneumonia 2013, 06/2018  . Respiratory failure ([place]) 11/13   with stroke - in hosp [facility] 12 weeks  . Seizures ([place])    during elvated blood sugsr episode 11/13  . Sepsis ([place])   . Stroke ([place])    11/2018, 12/2018 Weak left side, speech- slurred, Short term memeroy, Gait unsteady   . Vocal cord dysfunction     Family History  Problem Relation Age of Onset  . Lung cancer Maternal Grandmother   . Heart attack Maternal Grandfather   . Diabetes Paternal Grandfather     Past Surgical History:  Procedure Laterality Date  . ABDOMINAL AORTOGRAM W/LOWER EXTREMITY N/A 06/25/2019   Procedure: ABDOMINAL AORTOGRAM W/LOWER EXTREMITY;  Surgeon: Serafina Mitchell, MD;  Location: [facility];  Service: Cardiovascular;  Laterality: N/A;  . AMPUTATION Left 07/03/2019   Procedure: LEFT FOOT 4TH AND 5TH RAY AMPUTATION;  Surgeon: Newt Minion, MD;  Location: [facility];  Service: Orthopedics;  Laterality: Left;  . APPENDECTOMY    . INSERTION OF DIALYSIS CATHETER  11/13   removed in 4-6 weeks  . IR ANGIO INTRA EXTRACRAN SEL COM CAROTID INNOMINATE BILAT MOD SED  02/07/2019  . IR ANGIO VERTEBRAL SEL VERTEBRAL BILAT MOD SED  02/07/2019    . IR ANGIOGRAM EXTREMITY LEFT  02/07/2019  . IR ANGIOGRAM EXTREMITY RIGHT  02/20/2019  . IR TRANSCATH EXCRAN VERT OR CAR A STENT  02/20/2019  . IR TRANSCATH EXCRAN VERT OR CAR A STENT  03/25/2019  . IR US GUIDE VASC ACCESS RIGHT  02/07/2019  . MULTIPLE EXTRACTIONS WITH ALVEOLOPLASTY N/A 02/24/2014   Procedure: MULTIPLE EXTRACTION WITH ALVEOLOPLASTY AND BIOPSY;  Surgeon: Gae Bon, DDS;  Location: [facility];  Service: Oral Surgery;  Laterality: N/A;  . PERIPHERAL VASCULAR ATHERECTOMY Left 06/25/2019   Procedure: PERIPHERAL VASCULAR ATHERECTOMY;  Surgeon:  Serafina Mitchell, MD;  Location: [facility];  Service: Cardiovascular;  Laterality: Left;  superficial femoral  . RADIOLOGY WITH ANESTHESIA N/A 02/20/2019   Procedure: RADIOLOGY WITH ANESTHESIA   STENTING;  Surgeon: Luanne Bras, MD;  Location: [facility];  Service: Radiology;  Laterality: N/A;  . RADIOLOGY WITH ANESTHESIA N/A 03/25/2019   Procedure: RADIOLOGY WITH ANESTHESIA STENTING;  Surgeon: Luanne Bras, MD;  Location: [facility];  Service: Radiology;  Laterality: N/A;  . SKIN SPLIT GRAFT Left 07/03/2019   Procedure: APPLY SKIN GRAFT LEFT FOOT;  Surgeon: Newt Minion, MD;  Location: [facility];  Service: Orthopedics;  Laterality: Left;  . TRACHEOSTOMY  11/13   closed 1/14  . TRANSRECTAL DRAINAGE OF PELVIC ABSCESS  12/11   Social History   Occupational History  . Not on file  Tobacco Use  . Smoking status: Former Smoker    Packs/day: 0.60    Years: 23.00    Pack years: 13.80    Types: Cigarettes    Quit date: 02/05/2019    Years since quitting: 0.4  . Smokeless tobacco: Never Used  Substance and Sexual Activity  . Alcohol use: No    Alcohol/week: 0.0 standard drinks  . Drug use: No  . Sexual activity: Not Currently    Birth control/protection: None

## 2019-07-22 NOTE — Progress Notes (Signed)
[facility]  Patient name: Taylor [name] MRN: [id number] DOB: [birth year] Sex: female   REASON FOR VISIT:    Follow-up  HISOTRY OF PRESENT ILLNESS:    Taylor [name] is a 39 y.o. female who was transferred to [facility] for vascular evaluation.  She initially had a left foot inspection with cellulitis and abscess complicated by fifth toe osteomyelitis.  While at [facility] she underwent incision and drainage of this area.  ABIs were performed which showed moderate arterial disease in the left and so she was sent to [facility].  On 06/25/2019 she went for angiography.  She was found to have a 90% mid superficial femoral artery lesion and an occluded short segment popliteal artery.  These were both successfully crossed and treated with jetstream atherectomy and drug-coated balloon angioplasty using a 4 mm balloon.  She was also found to have a 90% profunda origin stenosis which was not treated.  On 07/03/2019 Dr. Sharol Given performed a left fourth and fifth ray amputation along with placement of ACell  The patient has history of acute renal failure, but creatinine is normal on admission.  White blood cell count is within normal limits.  She is a diabetic which is poorly controlled.  She does have a history of stroke, status post vertebral artery stenting.  She is a former smoker.  The patient is now on a statin for hypercholesterolemia.  She is bipolar  PAST MEDICAL HISTORY:   Past Medical History:  Diagnosis Date   Anxiety    ARF (acute renal failure) ([place]) 2013   Bipolar disorder ([place])    Cerebral artery occlusion with cerebral infarction ([place]) 11/13   due to elevated blood sugar 1700-off insulin 6 months   Depression    Diabetes mellitus    off insulin 6 months   Diverticulitis    Dyspnea    when walking   GERD (gastroesophageal reflux disease)    Headache(784.0)    Hypertension    Kidney dialysis  status    on dialysis(stroke) off after 4-6 weeks   Pneumonia 2013, 06/2018   Respiratory failure ([place]) 11/13   with stroke - in hosp [facility] 12 weeks   Seizures ([place])    during elvated blood sugsr episode 11/13   Sepsis ([place])    Stroke ([place])    11/2018, 12/2018 Weak left side, speech- slurred, Short term memeroy, Gait unsteady    Vocal cord dysfunction      FAMILY HISTORY:   Family History  Problem Relation Age of Onset   Lung cancer Maternal Grandmother    Heart attack Maternal Grandfather    Diabetes Paternal Grandfather     SOCIAL HISTORY:   Social History   Tobacco Use   Smoking status: Former Smoker    Packs/day: 0.60    Years: 23.00    Pack years: 13.80    Types: Cigarettes    Quit date: 02/05/2019    Years since quitting: 0.4   Smokeless tobacco: Never Used  Substance Use Topics   Alcohol use: No    Alcohol/week: 0.0 standard drinks     ALLERGIES:   Allergies  Allergen Reactions   Contrast Media [Iodinated Diagnostic Agents] Other (See Comments)    Went into kidney failure    Amitriptyline Other (See Comments)    Unresponsive    Compazine Other (See Comments)    Alert but unable to move   Morphine And Related Other (See Comments)  Chest pain   Oxycodone Nausea And Vomiting    Severe n/v -  patient will not take oxycodone or percocet.   Prochlorperazine Other (See Comments)    Alert but unable to move   Promethazine Swelling    Lips swell   Sumatriptan Rash     CURRENT MEDICATIONS:   Current Outpatient Medications  Medication Sig Dispense Refill   acetaminophen (TYLENOL) 500 MG tablet Take 500 mg by mouth every 6 (six) hours as needed for headache (pain). pain      aspirin 325 MG tablet Take 325 mg by mouth daily.     atorvastatin (LIPITOR) 40 MG tablet Take 40 mg by mouth daily.     blood glucose meter kit and supplies Dispense based on patient and insurance preference. Use up to four times daily as directed. (FOR  ICD-10 E10.9, E11.9). 1 each 0   ceFAZolin (ANCEF) IVPB Inject 2 g into the vein every 8 (eight) hours. Indication:  Osteomyelitis of left foot with MSSA/Group B Strep  Last Day of Therapy:  08/13/19 Labs - Once weekly:  CBC/D and BMP, Labs - Every other week:  ESR and CRP Please remove PIC line after completion of antibiotic therapy. 126 Units 0   clopidogrel (PLAVIX) 75 MG tablet TAKE 1 TABLET BY MOUTH DAILY (Patient taking differently: Take 75 mg by mouth daily. ) 30 tablet 5   dapagliflozin propanediol (FARXIGA) 10 MG TABS tablet Take 10 mg by mouth daily.      diazepam (VALIUM) 5 MG tablet Take 5 mg by mouth 2 (two) times daily.      diphenhydrAMINE (BENADRYL) 25 MG tablet Take 50 mg by mouth See admin instructions. Take 2 tablets (50 mg) by mouth one hour prior to procedure involving contrast dye     docusate sodium (COLACE) 100 MG capsule Take 1 capsule (100 mg total) by mouth 2 (two) times daily. 10 capsule 0   DULoxetine (CYMBALTA) 30 MG capsule Take 30 mg by mouth daily.     gabapentin (NEURONTIN) 300 MG capsule TAKE THREE CAPSULES BY MOUTH EVERY MORNING and TAKE THREE CAPSULES BY MOUTH AT NOON and TAKE FOUR CAPSULES BY MOUTH AT BEDTIME (Patient taking differently: Take 900 mg by mouth 3 (three) times daily. ) 300 capsule 5   HYDROcodone-acetaminophen (NORCO) 10-325 MG tablet Take 1 tablet by mouth every 6 (six) hours as needed. 30 tablet 0   ibuprofen (ADVIL) 400 MG tablet Take 1 tablet (400 mg total) by mouth every 8 (eight) hours as needed for moderate pain. 30 tablet 0   labetalol (NORMODYNE) 300 MG tablet Take 300 mg by mouth 2 (two) times daily.     levETIRAcetam (KEPPRA XR) 500 MG 24 hr tablet Take 500 mg by mouth 2 (two) times daily.      lisinopril (ZESTRIL) 40 MG tablet Take 40 mg by mouth daily.      metFORMIN (GLUCOPHAGE) 500 MG tablet Take 500 mg by mouth 2 (two) times daily.     metroNIDAZOLE (FLAGYL) 500 MG tablet Take 1 tablet (500 mg total) by mouth 3  (three) times daily. Last day of antibiotics: 08/13/19 135 tablet 0   nitroGLYCERIN (NITRODUR - DOSED IN MG/24 HR) 0.2 mg/hr patch Place 1 patch (0.2 mg total) onto the skin daily. Place patch on foot in different spot daily 30 patch 12   pantoprazole (PROTONIX) 40 MG tablet Take 1 tablet (40 mg total) by mouth daily. 30 tablet 0   risperiDONE (RISPERDAL) 0.5 MG tablet Take  0.5 mg by mouth at bedtime.     silver sulfADIAZINE (SILVADENE) 1 % cream Apply 1 application topically daily. 400 g 0   traZODone (DESYREL) 100 MG tablet Take 200 mg by mouth at bedtime.      No current facility-administered medications for this visit.    REVIEW OF SYSTEMS:   '[X]'$  denotes positive finding, '[ ]'$  denotes negative finding Cardiac  Comments:  Chest pain or chest pressure:    Shortness of breath upon exertion:    Short of breath when lying flat:    Irregular heart rhythm:        Vascular    Pain in calf, thigh, or hip brought on by ambulation:    Pain in feet at night that wakes you up from your sleep:     Blood clot in your veins:    Leg swelling:         Pulmonary    Oxygen at home:    Productive cough:     Wheezing:         Neurologic    Sudden weakness in arms or legs:     Sudden numbness in arms or legs:     Sudden onset of difficulty speaking or slurred speech:    Temporary loss of vision in one eye:     Problems with dizziness:         Gastrointestinal    Blood in stool:     Vomited blood:         Genitourinary    Burning when urinating:     Blood in urine:        Psychiatric    Major depression:         Hematologic    Bleeding problems:    Problems with blood clotting too easily:        Skin    Rashes or ulcers:        Constitutional    Fever or chills:      PHYSICAL EXAM:   Vitals:   07/22/19 1517  BP: 117/78  Pulse: 89  Resp: 20  Temp: (!) 97.4 F (36.3 C)  SpO2: 96%  Weight: 197 lb (89.4 kg)  Height: '5\' 4"'$  (1.626 m)    GENERAL: The patient is a  well-nourished female, in no acute distress. The vital signs are documented above. CARDIAC: There is a regular rate and rhythm.  VASCULAR: Pedal pulses are nonpalpable PULMONARY: Non-labored respirations ABDOMEN: Soft and non-tender with normal pitched bowel sounds.  MUSCULOSKELETAL: There are no major deformities or cyanosis. NEUROLOGIC: No focal weakness or paresthesias are detected. SKIN: Skin graft intact within the wound.  She appears to have dependent rubor in the foot.  This edges of the wound appear viable. PSYCHIATRIC: The patient has a normal affect.  STUDIES:   I have reviewed the following: +-------+-----------+-----------+------------+------------+   [name]/TBI Today's [name] Today's TBI Previous [name] Previous TBI   +-------+-----------+-----------+------------+------------+   Right  0.67     0.47     0.61              +-------+-----------+-----------+------------+------------+   Left   1.03     0.75     0.46              +-------+-----------+-----------+------------+------------+   Left toe 75 Right toe 47  Left: Patent left lower extremity with stenosis as noted above.  Heterogenous plaque noted throughout.    MEDICAL ISSUES:   Atherosclerotic vascular disease, left leg: Ultrasound today  shows evidence of recurrent stenosis in the left superficial femoral and popliteal artery.  Peak velocities are less than 300.  Her [name] was 1.03.  I plan on reevaluating her with duplex and ABIs in 3 months.  If she has had further progression, I will need to repeat angiography.  I did tell her that she had very small arteries and so I would not be surprised if she had recurrent stenosis.  She will continue to get wound care with Dr. Ermalinda Memos, MD, [facility] [phone] Pager [phone]

## 2019-07-23 ENCOUNTER — Other Ambulatory Visit: Payer: Self-pay | Admitting: Orthopedic Surgery

## 2019-07-23 ENCOUNTER — Other Ambulatory Visit: Payer: Self-pay | Admitting: *Deleted

## 2019-07-23 DIAGNOSIS — I70245 Atherosclerosis of native arteries of left leg with ulceration of other part of foot: Secondary | ICD-10-CM

## 2019-07-23 DIAGNOSIS — I739 Peripheral vascular disease, unspecified: Secondary | ICD-10-CM

## 2019-07-23 NOTE — Telephone Encounter (Signed)
Medical approved through [facility] portal for prior approval. Confirmation number [id number]

## 2019-07-24 NOTE — Telephone Encounter (Signed)
I do not have the ability to close out this request. This was already sent to pharm and the PA through [facility] portal has been obtained. You can refuse as it has already been filled I just do not have authorization to close.

## 2019-07-29 ENCOUNTER — Encounter: Payer: Self-pay | Admitting: Physician Assistant

## 2019-07-29 ENCOUNTER — Other Ambulatory Visit: Payer: Self-pay

## 2019-07-29 ENCOUNTER — Ambulatory Visit (INDEPENDENT_AMBULATORY_CARE_PROVIDER_SITE_OTHER): Payer: Medicaid Other | Admitting: Physician Assistant

## 2019-07-29 VITALS — Ht 64.0 in | Wt 197.0 lb

## 2019-07-29 DIAGNOSIS — M86272 Subacute osteomyelitis, left ankle and foot: Secondary | ICD-10-CM

## 2019-07-29 MED ORDER — HYDROCODONE-ACETAMINOPHEN 10-325 MG PO TABS: 1.0000 | ORAL_TABLET | Freq: Four times a day (QID) | ORAL | 0 refills | Status: DC | PRN

## 2019-07-29 NOTE — Progress Notes (Signed)
Office Visit Note   Patient: Taylor [name]           Date of Birth: March 07, 1981           MRN: [id number] Visit Date: 07/29/2019              Requested by: Monico Blitz, MD [address] [place],   [id number] PCP: Monico Blitz, MD  Chief Complaint  Patient presents with  . 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  She is still has a PICC line in and is receiving IV antibiotics for another couple weeks.  She is with her mother and they both think she continues to improve.  She does have some tenderness and redness over the lateral side of her incision where her sutures are.  Assessment & Plan: Visit Diagnoses: No diagnosis found.  Plan: Sutures and a staple will be harvested today.  I would like her to remain nonweightbearing for 1 more week.  Follow-up at that time  Follow-Up Instructions: No follow-ups on file.   Ortho Exam  Patient is alert, oriented, no adenopathy, well-dressed, normal affect, normal respiratory effort. Focused examination of her left foot demonstrates lateral incision slight superficial dehiscence but sutures have loosened and are no longer effective.  There is no dehiscence deep and much of the wound is well approximated there is no evidence of any necrosis.  There is no foul odor no drainage.  The wound bed over the graft has good granulation tissue centrally there is no foul odor no drainage there is some mild erythema around the lateral foot incision.  There is no fluctuance to palpation.  I could not express any fluid.  Swelling is overall well controlled  Imaging: No results found. No images are attached to the encounter.  Labs: Lab Results  Component Value Date   HGBA1C 10.0 (H) 06/23/2019   ESRSEDRATE 96 (H) 06/23/2019   CRP 7.9 (H) 06/23/2019   REPTSTATUS 06/30/2019 FINAL 06/23/2019   CULT NO GROWTH 7  DAYS 06/23/2019     Lab Results  Component Value Date   ALBUMIN 2.9 (L) 07/01/2019   ALBUMIN 2.2 (L) 06/26/2019   ALBUMIN 3.6 03/02/2012   PREALBUMIN 17.3 (L) 06/23/2019    Lab Results  Component Value Date   MG 2.2 06/25/2019   MG 1.4 (L) 06/24/2019   No results found for: VD25OH  Lab Results  Component Value Date   PREALBUMIN 17.3 (L) 06/23/2019   CBC EXTENDED Latest Ref Rng & Units 07/05/2019 07/04/2019 07/03/2019  WBC 4.0 - 10.5 K/uL - - -  RBC 3.87 - 5.11 MIL/uL - - -  HGB 12.0 - 15.0 g/dL 8.2(L) 7.8(L) 7.9(L)  HCT 36.0 - 46.0 % 29.8(L) 27.8(L) 28.9(L)  PLT 150 - 400 K/uL - - -  NEUTROABS 1.7 - 7.7 K/uL - - -  LYMPHSABS 0.7 - 4.0 K/uL - - -     Body mass index is 33.81 kg/m.  Orders:  No orders of the defined types were placed in this encounter.  Meds ordered this encounter  Medications  . HYDROcodone-acetaminophen (NORCO) 10-325 MG tablet    Sig: Take 1 tablet by mouth every 6 (six) hours as needed.    Dispense:  30 tablet    Refill:  0     Procedures: No  procedures performed  Clinical Data: No additional findings.  ROS:  All other systems negative, except as noted in the HPI. Review of Systems  Objective: Vital Signs: Ht 5\' 4"  (1.626 m)   Wt 197 lb (89.4 kg)   BMI 33.81 kg/m   Specialty Comments:  No specialty comments available.  PMFS History: Patient Active Problem List   Diagnosis Date Noted  . Obesity, Class III, BMI 40-49.9 (morbid obesity) ([place]) 07/05/2019  . Diabetic foot infection ([place]) 06/23/2019  . PAD (peripheral artery disease) ([place])   . Osteomyelitis of left foot ([place])   . Vertebral artery stenosis, left 03/25/2019  . VBI (vertebrobasilar insufficiency) 02/20/2019  . Intracranial carotid stenosis, left 01/23/2019  . 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 Tobacco use disorder 01/02/2015  . Cognitive impairment 01/02/2015  . Stroke ([place]) 09/07/2012  . Pain in upper  limb 09/07/2012  . Pain in lower limb 09/07/2012  . Bipolar affective disorder ([place]) 09/07/2012  . Depressive disorder 09/07/2012  . Type II diabetes mellitus ([place]) 09/07/2012  . Memory loss 09/07/2012  . 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 Personal history of noncompliance with medical treatment, presenting hazards to health 05/25/2012  . Cerebral artery occlusion with cerebral infarction ([place]) 05/10/2012   Past Medical History:  Diagnosis Date  . Anxiety   . ARF (acute renal failure) ([place]) 2013  . Bipolar disorder ([place])   . Cerebral artery occlusion with cerebral infarction ([place]) 11/13   due to elevated blood sugar 1700-off insulin 6 months  . Depression   . Diabetes mellitus    off insulin 6 months  . Diverticulitis   . Dyspnea    when walking  . GERD (gastroesophageal reflux disease)   . Headache(784.0)   . Hypertension   . Kidney dialysis status    on dialysis(stroke) off after 4-6 weeks  . Pneumonia 2013, 06/2018  . Respiratory failure ([place]) 11/13   with stroke - in hosp n[facility] 12 weeks  . Seizures ([place])    during elvated blood sugsr episode 11/13  . Sepsis ([place])   . Stroke ([place])    11/2018, 12/2018 Weak left side, speech- slurred, Short term memeroy, Gait unsteady   . Vocal cord dysfunction     Family History  Problem Relation Age of Onset  . Lung cancer Maternal Grandmother   . Heart attack Maternal Grandfather   . Diabetes Paternal Grandfather     Past Surgical History:  Procedure Laterality Date  . ABDOMINAL AORTOGRAM W/LOWER EXTREMITY N/A 06/25/2019   Procedure: ABDOMINAL AORTOGRAM W/LOWER EXTREMITY;  Surgeon: Serafina Mitchell, MD;  Location: [facility];  Service: Cardiovascular;  Laterality: N/A;  . AMPUTATION Left 07/03/2019   Procedure: LEFT FOOT 4TH AND 5TH RAY AMPUTATION;  Surgeon: Newt Minion, MD;  Location: [facility];  Service: Orthopedics;  Laterality: Left;  . APPENDECTOMY    . INSERTION OF DIALYSIS CATHETER  11/13   removed in 4-6 weeks  . IR  ANGIO INTRA EXTRACRAN SEL COM CAROTID INNOMINATE BILAT MOD SED  02/07/2019  . IR ANGIO VERTEBRAL SEL VERTEBRAL BILAT MOD SED  02/07/2019  . IR ANGIOGRAM EXTREMITY LEFT  02/07/2019  . IR ANGIOGRAM EXTREMITY RIGHT  02/20/2019  . IR TRANSCATH EXCRAN VERT OR CAR A STENT  02/20/2019  . IR TRANSCATH EXCRAN VERT OR CAR A STENT  03/25/2019  . IR US GUIDE VASC ACCESS RIGHT  02/07/2019  . MULTIPLE EXTRACTIONS WITH ALVEOLOPLASTY N/A 02/24/2014   Procedure:  MULTIPLE EXTRACTION WITH ALVEOLOPLASTY AND BIOPSY;  Surgeon: Gae Bon, DDS;  Location: [facility];  Service: Oral Surgery;  Laterality: N/A;  . PERIPHERAL VASCULAR ATHERECTOMY Left 06/25/2019   Procedure: PERIPHERAL VASCULAR ATHERECTOMY;  Surgeon: Serafina Mitchell, MD;  Location: [facility];  Service: Cardiovascular;  Laterality: Left;  superficial femoral  . RADIOLOGY WITH ANESTHESIA N/A 02/20/2019   Procedure: RADIOLOGY WITH ANESTHESIA   STENTING;  Surgeon: Luanne Bras, MD;  Location: [facility];  Service: Radiology;  Laterality: N/A;  . RADIOLOGY WITH ANESTHESIA N/A 03/25/2019   Procedure: RADIOLOGY WITH ANESTHESIA STENTING;  Surgeon: Luanne Bras, MD;  Location: [facility];  Service: Radiology;  Laterality: N/A;  . SKIN SPLIT GRAFT Left 07/03/2019   Procedure: APPLY SKIN GRAFT LEFT FOOT;  Surgeon: Newt Minion, MD;  Location: [facility];  Service: Orthopedics;  Laterality: Left;  . TRACHEOSTOMY  11/13   closed 1/14  . TRANSRECTAL DRAINAGE OF PELVIC ABSCESS  12/11   Social History   Occupational History  . Not on file  Tobacco Use  . Smoking status: Former Smoker    Packs/day: 0.60    Years: 23.00    Pack years: 13.80    Types: Cigarettes    Quit date: 02/05/2019    Years since quitting: 0.4  . Smokeless tobacco: Never Used  Substance and Sexual Activity  . Alcohol use: No    Alcohol/week: 0.0 standard drinks  . Drug use: No  . Sexual activity: Not Currently    Birth control/protection: None

## 2019-07-30 ENCOUNTER — Telehealth: Payer: Self-pay

## 2019-07-30 ENCOUNTER — Encounter (HOSPITAL_COMMUNITY): Payer: Self-pay | Admitting: Emergency Medicine

## 2019-07-30 ENCOUNTER — Emergency Department (HOSPITAL_COMMUNITY)
Admission: EM | Admit: 2019-07-30 | Discharge: 2019-07-30 | Disposition: A | Discharge status: Home / Self Care | Payer: Medicaid Other | Attending: Emergency Medicine | Admitting: Emergency Medicine

## 2019-07-30 ENCOUNTER — Emergency Department (HOSPITAL_COMMUNITY): Payer: Medicaid Other

## 2019-07-30 ENCOUNTER — Other Ambulatory Visit: Payer: Self-pay

## 2019-07-30 ENCOUNTER — Inpatient Hospital Stay: Payer: Medicaid Other | Admitting: Internal Medicine

## 2019-07-30 ENCOUNTER — Other Ambulatory Visit (HOSPITAL_COMMUNITY): Payer: Self-pay

## 2019-07-30 DIAGNOSIS — E1122 Type 2 diabetes mellitus with diabetic chronic kidney disease: Secondary | ICD-10-CM | POA: Insufficient documentation

## 2019-07-30 DIAGNOSIS — Z7982 Long term (current) use of aspirin: Secondary | ICD-10-CM | POA: Insufficient documentation

## 2019-07-30 DIAGNOSIS — Z992 Dependence on renal dialysis: Secondary | ICD-10-CM | POA: Diagnosis not present

## 2019-07-30 DIAGNOSIS — Z87891 Personal history of nicotine dependence: Secondary | ICD-10-CM | POA: Diagnosis not present

## 2019-07-30 DIAGNOSIS — T82598A Other mechanical complication of other cardiac and vascular devices and implants, initial encounter: Secondary | ICD-10-CM | POA: Insufficient documentation

## 2019-07-30 DIAGNOSIS — Z8673 Personal history of transient ischemic attack (TIA), and cerebral infarction without residual deficits: Secondary | ICD-10-CM | POA: Insufficient documentation

## 2019-07-30 DIAGNOSIS — Z95828 Presence of other vascular implants and grafts: Secondary | ICD-10-CM

## 2019-07-30 DIAGNOSIS — Z79899 Other long term (current) drug therapy: Secondary | ICD-10-CM | POA: Insufficient documentation

## 2019-07-30 DIAGNOSIS — N189 Chronic kidney disease, unspecified: Secondary | ICD-10-CM | POA: Diagnosis not present

## 2019-07-30 DIAGNOSIS — Z7984 Long term (current) use of oral hypoglycemic drugs: Secondary | ICD-10-CM | POA: Diagnosis not present

## 2019-07-30 MED ORDER — HYDROCODONE-ACETAMINOPHEN 10-325 MG PO TABS
1.0000 | ORAL_TABLET | Freq: Four times a day (QID) | ORAL | Status: AC | PRN
  Administered 2019-07-30: 1 via ORAL
  Filled 2019-07-30: qty 1

## 2019-07-30 MED ORDER — CEFAZOLIN SODIUM-DEXTROSE 2-4 GM/100ML-% IV SOLN
2.0000 g | Freq: Three times a day (TID) | INTRAVENOUS | Status: AC
  Administered 2019-07-30: 22:00:00 2 g via INTRAVENOUS
  Filled 2019-07-30: qty 100

## 2019-07-30 NOTE — ED Provider Notes (Addendum)
[facility] EMERGENCY DEPARTMENT Provider Note   CSN: [id number] Arrival date & time: 07/30/19  1611     History Chief Complaint  Patient presents with   Vascular Access Problem   Fall    Taylor [name] is a 39 y.o. female with a pertinent past medical history of stroke, DM, HTN, and osteomyelitis s/p 4th and 5th digit amputation  (on IV ancef), who presented to [facility] after having her PICC line cut by a home health nurse today. Patient's mother states that she is not sure if the nurse pull the line out or just cut it. She states that she took her last dose of ancef this morning and has since missed one dose. The patient has a follow up appointment with Dr. Megan Salon tomorrow to re-evaluate her left foot wound and the need for further IV therapy.   HPI     Past Medical History:  Diagnosis Date   Anxiety    ARF (acute renal failure) ([place]) 2013   Bipolar disorder ([place])    Cerebral artery occlusion with cerebral infarction ([place]) 11/13   due to elevated blood sugar 1700-off insulin 6 months   Depression    Diabetes mellitus    off insulin 6 months   Diverticulitis    Dyspnea    when walking   GERD (gastroesophageal reflux disease)    Headache(784.0)    Hypertension    Kidney dialysis status    on dialysis(stroke) off after 4-6 weeks   Pneumonia 2013, 06/2018   Respiratory failure ([place]) 11/13   with stroke - in hosp ncbh 12 weeks   Seizures ([place])    during elvated blood sugsr episode 11/13   Sepsis ([place])    Stroke ([place])    11/2018, 12/2018 Weak left side, speech- slurred, Short term memeroy, Gait unsteady    Vocal cord dysfunction     Patient Active Problem List   Diagnosis Date Noted   Obesity, Class III, BMI 40-49.9 (morbid obesity) ([place]) 07/05/2019   Diabetic foot infection ([place]) 06/23/2019   PAD (peripheral artery disease) (H[facility])    Osteomyelitis of left foot ([facility])    Vertebral artery stenosis, left 03/25/2019   VBI  (vertebrobasilar insufficiency) 02/20/2019   Intracranial carotid stenosis, left 01/23/2019   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   Tobacco use disorder 01/02/2015   Cognitive impairment 01/02/2015   Stroke ([place]) 09/07/2012   Pain in upper limb 09/07/2012   Pain in lower limb 09/07/2012   Bipolar affective disorder ([place]) 09/07/2012   Depressive disorder 09/07/2012   Type II diabetes mellitus ([place]) 09/07/2012   Memory loss 09/07/2012   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   Personal history of noncompliance with medical treatment, presenting hazards to health 05/25/2012   Cerebral artery occlusion with cerebral infarction ([place]) 05/10/2012    Past Surgical History:  Procedure Laterality Date   ABDOMINAL AORTOGRAM W/LOWER EXTREMITY N/A 06/25/2019   Procedure: ABDOMINAL AORTOGRAM W/LOWER EXTREMITY;  Surgeon: Serafina Mitchell, MD;  Location: [facility];  Service: Cardiovascular;  Laterality: N/A;   AMPUTATION Left 07/03/2019   Procedure: LEFT FOOT 4TH AND 5TH RAY AMPUTATION;  Surgeon: Newt Minion, MD;  Location: [facility];  Service: Orthopedics;  Laterality: Left;   APPENDECTOMY     INSERTION OF DIALYSIS CATHETER  11/13   removed in 4-6 weeks   IR ANGIO INTRA EXTRACRAN SEL COM CAROTID INNOMINATE BILAT MOD SED  02/07/2019   IR ANGIO VERTEBRAL SEL  VERTEBRAL BILAT MOD SED  02/07/2019   IR ANGIOGRAM EXTREMITY LEFT  02/07/2019   IR ANGIOGRAM EXTREMITY RIGHT  02/20/2019   IR TRANSCATH [employer] VERT OR CAR A STENT  02/20/2019   IR TRANSCATH EXCRAN VERT OR CAR A STENT  03/25/2019   IR US GUIDE VASC ACCESS RIGHT  02/07/2019   MULTIPLE EXTRACTIONS WITH ALVEOLOPLASTY N/A 02/24/2014   Procedure: MULTIPLE EXTRACTION WITH ALVEOLOPLASTY AND BIOPSY;  Surgeon: Gae Bon, DDS;  Location: [facility];  Service: Oral Surgery;  Laterality: N/A;   PERIPHERAL VASCULAR ATHERECTOMY Left 06/25/2019   Procedure: PERIPHERAL VASCULAR ATHERECTOMY;   Surgeon: Serafina Mitchell, MD;  Location: [facility];  Service: Cardiovascular;  Laterality: Left;  superficial femoral   RADIOLOGY WITH ANESTHESIA N/A 02/20/2019   Procedure: RADIOLOGY WITH ANESTHESIA   STENTING;  Surgeon: Luanne Bras, MD;  Location: [facility];  Service: Radiology;  Laterality: N/A;   RADIOLOGY WITH ANESTHESIA N/A 03/25/2019   Procedure: RADIOLOGY WITH ANESTHESIA STENTING;  Surgeon: Luanne Bras, MD;  Location: [facility];  Service: Radiology;  Laterality: N/A;   SKIN SPLIT GRAFT Left 07/03/2019   Procedure: APPLY SKIN GRAFT LEFT FOOT;  Surgeon: Newt Minion, MD;  Location: [facility];  Service: Orthopedics;  Laterality: Left;   TRACHEOSTOMY  11/13   closed 1/14   TRANSRECTAL DRAINAGE OF PELVIC ABSCESS  12/11     OB History   No obstetric history on file.     Family History  Problem Relation Age of Onset   Lung cancer Maternal Grandmother    Heart attack Maternal Grandfather    Diabetes Paternal Grandfather     Social History   Tobacco Use   Smoking status: Former Smoker    Packs/day: 0.60    Years: 23.00    Pack years: 13.80    Types: Cigarettes    Quit date: 02/05/2019    Years since quitting: 0.4   Smokeless tobacco: Never Used  Substance Use Topics   Alcohol use: No    Alcohol/week: 0.0 standard drinks   Drug use: No    Home Medications Prior to Admission medications   Medication Sig Start Date End Date Taking? Authorizing Provider  acetaminophen (TYLENOL) 500 MG tablet Take 500 mg by mouth every 6 (six) hours as needed for headache (pain). pain     [provider]  aspirin 325 MG tablet Take 325 mg by mouth daily.    [provider]  atorvastatin (LIPITOR) 40 MG tablet Take 40 mg by mouth daily.    [provider]  blood glucose meter kit and supplies Dispense based on patient and insurance preference. Use up to four times daily as directed. (FOR ICD-10 E10.9, E11.9). 07/05/19   Arrien, Jimmy Picket,  MD  ceFAZolin (ANCEF) IVPB Inject 2 g into the vein every 8 (eight) hours. Indication:  Osteomyelitis of left foot with MSSA/Group B Strep  Last Day of Therapy:  08/13/19 Labs - Once weekly:  CBC/D and BMP, Labs - Every other week:  ESR and CRP Please remove PIC line after completion of antibiotic therapy. 07/05/19 08/19/19  Arrien, Jimmy Picket, MD  clopidogrel (PLAVIX) 75 MG tablet TAKE 1 TABLET BY MOUTH DAILY Patient taking differently: Take 75 mg by mouth daily.  06/03/19   Narda Amber K, DO  dapagliflozin propanediol (FARXIGA) 10 MG TABS tablet Take 10 mg by mouth daily.     [provider]  diazepam (VALIUM) 5 MG tablet Take 5 mg by mouth 2 (two) times  daily.     [provider]  diphenhydrAMINE (BENADRYL) 25 MG tablet Take 50 mg by mouth See admin instructions. Take 2 tablets (50 mg) by mouth one hour prior to procedure involving contrast dye    [provider]  docusate sodium (COLACE) 100 MG capsule Take 1 capsule (100 mg total) by mouth 2 (two) times daily. 07/05/19   Arrien, Jimmy Picket, MD  DULoxetine (CYMBALTA) 30 MG capsule Take 30 mg by mouth daily.    [provider]  gabapentin (NEURONTIN) 300 MG capsule TAKE THREE CAPSULES BY MOUTH EVERY MORNING and TAKE THREE CAPSULES BY MOUTH AT NOON and TAKE FOUR CAPSULES BY MOUTH AT BEDTIME Patient taking differently: Take 900 mg by mouth 3 (three) times daily.  05/06/19   Narda Amber K, DO  HYDROcodone-acetaminophen (NORCO) 10-325 MG tablet Take 1 tablet by mouth every 6 (six) hours as needed. 07/29/19   Persons, Bevely Palmer, PA  ibuprofen (ADVIL) 400 MG tablet Take 1 tablet (400 mg total) by mouth every 8 (eight) hours as needed for moderate pain. 07/05/19   Arrien, Jimmy Picket, MD  labetalol (NORMODYNE) 300 MG tablet Take 300 mg by mouth 2 (two) times daily.    [provider]  levETIRAcetam (KEPPRA XR) 500 MG 24 hr tablet Take 500 mg by mouth 2 (two) times daily.     [provider]  lisinopril (ZESTRIL) 40 MG tablet Take 40 mg by mouth daily.     [provider]  metFORMIN (GLUCOPHAGE) 500 MG tablet Take 500 mg by mouth 2 (two) times daily. 11/16/18   [provider]  metroNIDAZOLE (FLAGYL) 500 MG tablet Take 1 tablet (500 mg total) by mouth 3 (three) times daily. Last day of antibiotics: 08/13/19 07/05/19 08/19/19  Arrien, Jimmy Picket, MD  nitroGLYCERIN (NITRODUR - DOSED IN MG/24 HR) 0.2 mg/hr patch Place 1 patch (0.2 mg total) onto the skin daily. Place patch on foot in different spot daily 07/12/19   Persons, Bevely Palmer, PA  pantoprazole (PROTONIX) 40 MG tablet Take 1 tablet (40 mg total) by mouth daily. 07/06/19 08/05/19  Arrien, Jimmy Picket, MD  risperiDONE (RISPERDAL) 0.5 MG tablet Take 0.5 mg by mouth at bedtime.    [provider]  silver sulfADIAZINE (SILVADENE) 1 % cream Apply 1 application topically daily. 07/15/19   Newt Minion, MD  traZODone (DESYREL) 100 MG tablet Take 200 mg by mouth at bedtime.     [provider]    Allergies    Contrast media [iodinated diagnostic agents], Amitriptyline, Compazine, Morphine and related, Oxycodone, Prochlorperazine, Promethazine, and Sumatriptan  Review of Systems   Review of Systems  Constitutional: Negative for chills, diaphoresis, fatigue and fever.  Respiratory: Negative for cough and shortness of breath.   Cardiovascular: Negative for chest pain.  Gastrointestinal: Negative for abdominal pain and constipation.  Endocrine: Negative for polydipsia and polyphagia.  Genitourinary: Negative for dysuria.  Musculoskeletal: Negative for arthralgias and myalgias.  Neurological: Negative for dizziness, syncope and headaches.    Physical Exam Updated Vital Signs BP 123/81    Pulse 76    Temp 98.1 F (36.7 C) (Oral)    Resp (!) 21    Ht '5\' 4"'$  (1.626 m)    Wt 86.2 kg    SpO2 100%    BMI 32.61 kg/m   Physical Exam Constitutional:      Appearance: Normal appearance.  HENT:      Head: Normocephalic and atraumatic.  Eyes:     Extraocular  Movements: Extraocular movements intact.  Cardiovascular:     Rate and Rhythm: Normal rate and regular rhythm.     Heart sounds: No murmur. No friction rub. No gallop.   Pulmonary:     Effort: Pulmonary effort is normal.     Breath sounds: Normal breath sounds.  Abdominal:     General: There is no distension.     Tenderness: There is no abdominal tenderness.  Musculoskeletal:        General: No swelling or tenderness. Normal range of motion.  Skin:    General: Skin is warm and dry.     Comments: See picture in chart for left LE wound.   Neurological:     General: No focal deficit present.     Mental Status: She is alert and oriented to person, place, and time.  Psychiatric:        Mood and Affect: Mood normal.     ED Results / Procedures / Treatments   Labs (all labs ordered are listed, but only abnormal results are displayed) Labs Reviewed - No data to display  EKG None  Radiology DG Ankle Complete Left  Result Date: 07/30/2019 CLINICAL DATA:  Fall, pain EXAM: LEFT ANKLE COMPLETE - 3+ VIEW COMPARISON:  06/11/2019 FINDINGS: No fracture or dislocation of the left ankle. Joint spaces are preserved. There is soft tissue edema about the lateral ankle. Partially imaged transmetatarsal amputation of the fourth and fifth metatarsals. IMPRESSION: No fracture or dislocation of the left ankle. Soft tissue edema about the lateral ankle. Electronically Signed   By: Eddie Candle M.D.   On: 07/30/2019 17:28   DG Chest Portable 1 View  Result Date: 07/30/2019 CLINICAL DATA:  PICC line removal EXAM: PORTABLE CHEST 1 VIEW COMPARISON:  07/24/2019 FINDINGS: There has been interval movable of the left-sided PICC line. The heart size is stable from prior study. There is no pneumothorax. There are likely small bilateral pleural effusions, left greater than right. There is no acute osseous abnormality detected. IMPRESSION: 1. Interval  removal of the left-sided PICC line. No evidence for residual catheter. 2. Probable small bilateral pleural effusions. Otherwise, stable appearance of the chest. Electronically Signed   By: Constance Holster M.D.   On: 07/30/2019 19:39   DG Knee Complete 4 Views Left  Result Date: 07/30/2019 CLINICAL DATA:  Fall, pain EXAM: LEFT KNEE - COMPLETE 4+ VIEW COMPARISON:  None. FINDINGS: No evidence of fracture, dislocation, or joint effusion. No evidence of arthropathy or other focal bone abnormality. Vascular calcinosis. IMPRESSION: No fracture or dislocation of the left knee. Joint spaces are preserved. Electronically Signed   By: Eddie Candle M.D.   On: 07/30/2019 17:26    Procedures Procedures (including critical care time)  Medications Ordered in ED Medications  ceFAZolin (ANCEF) IVPB 2g/100 mL premix (has no administration in time range)  HYDROcodone-acetaminophen (NORCO) 10-325 MG per tablet 1 tablet (1 tablet Oral Given 07/30/19 1957)    ED Course  I have reviewed the triage vital signs and the nursing notes.  Pertinent labs & imaging results that were available during my care of the patient were reviewed by me and considered in my medical decision making (see chart for details).    MDM Rules/Calculators/A&P                      Vascular Access Problem: - CXR for retained PICC line was WNL - Gave another dose of home pain medications - hydo-aceta 10-325 - Gave single  dose of IV ancef. We will send the patient out with IV access to follow up with Dr. Megan Salon.  Final Clinical Impression(s) / ED Diagnoses Final diagnoses:  S/P PICC central line placement    Rx / DC Orders ED Discharge Orders    None       Marianna Payment, MD 07/30/19 2147    Marianna Payment, MD 07/30/19 2150    Deno Etienne, DO 07/30/19 2155

## 2019-07-30 NOTE — Telephone Encounter (Signed)
Patients mom called stating nurse from [facility] came to the home today to do dressing change to PICC line and cut the PICC line. I advised should proceed to ER to have this changed out so that patient could continue with IV antibiotic.

## 2019-07-30 NOTE — Telephone Encounter (Signed)
Please see if she can come see me tomorrow so I can make a decision about replacing the PICC versus completing therapy with an oral antibiotic regimen.

## 2019-07-30 NOTE — ED Triage Notes (Signed)
Pt in POV. Home health nurse accidentally cut pt PICC line while doing a dressing change. Receiving IV antibiotics secondary to diabetic foot infection with osteomyelitis through 2/28. Secondary complaint, while pt was getting into car to come into hospital pt fell, reports L ankle pain and L knee pain. No deformities noted. Pt meets ALONE criteria. Visitor to remain with pt

## 2019-07-30 NOTE — Discharge Instructions (Addendum)
Thank you for letting us take part in your care. You were seen today for vascular access problem.   Summary: - Preformed X-ray of chest. Did not show signs of retained line - Gave another dose of Ancef   Plan: - We will send you home with your IV so you can complete your IV antibiotics  - Please follow up with Dr. Megan Salon tomorrow

## 2019-07-30 NOTE — Telephone Encounter (Signed)
Called Lattie Haw. Unable to leave VM.  Sent patient a MyChart message to call the office in the morning for possible appointment.  Eugenia Mcalpine

## 2019-07-30 NOTE — Telephone Encounter (Signed)
Received call from [facility]. Clinical Office manager calls and states [facility] nurse is currently in the patients home.  States nurse was due to change dressing and attempted to remove tape with scissors and cut the picc line with the scissors. Nurse pulled picc line and states is was intact.  Pateitn currently receiving Cefazolin IV and oral metronidazole for  Diabetic foot infection with osteomyelitis through 08/11/19.  Needs advise for MD if want to replace picc line.  Eugenia Mcalpine

## 2019-07-31 ENCOUNTER — Other Ambulatory Visit: Payer: Self-pay

## 2019-07-31 ENCOUNTER — Telehealth: Payer: Self-pay

## 2019-07-31 ENCOUNTER — Ambulatory Visit (INDEPENDENT_AMBULATORY_CARE_PROVIDER_SITE_OTHER): Payer: Medicaid Other | Admitting: Internal Medicine

## 2019-07-31 ENCOUNTER — Encounter: Payer: Self-pay | Admitting: Internal Medicine

## 2019-07-31 ENCOUNTER — Telehealth: Payer: Self-pay | Admitting: *Deleted

## 2019-07-31 DIAGNOSIS — L089 Local infection of the skin and subcutaneous tissue, unspecified: Secondary | ICD-10-CM

## 2019-07-31 DIAGNOSIS — E11628 Type 2 diabetes mellitus with other skin complications: Secondary | ICD-10-CM

## 2019-07-31 NOTE — Telephone Encounter (Signed)
Placed follow up call with Lattie Haw. She has already scheduled appointment to follow up today with Dr. Megan Salon.   Placed call to [facility] with [facility] and made aware of plan to follow up with patient in the office today to determine if patient needs to transition to oral antibiotics.   Eugenia Mcalpine

## 2019-07-31 NOTE — Progress Notes (Signed)
[facility] for Infectious Disease  Patient Active Problem List   Diagnosis Date Noted  . Diabetic foot infection ([place]) 06/23/2019    Priority: High  . Osteomyelitis of left foot ([place])     Priority: High  . Obesity, Class III, BMI 40-49.9 (morbid obesity) ([place]) 07/05/2019  . PAD (peripheral artery disease) ([place])   . Vertebral artery stenosis, left 03/25/2019  . VBI (vertebrobasilar insufficiency) 02/20/2019  . Intracranial carotid stenosis, left 01/23/2019  . 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 Tobacco use disorder 01/02/2015  . Cognitive impairment 01/02/2015  . Stroke ([place]) 09/07/2012  . Pain in upper limb 09/07/2012  . Pain in lower limb 09/07/2012  . Bipolar affective disorder ([place]) 09/07/2012  . Depressive disorder 09/07/2012  . Type II diabetes mellitus ([place]) 09/07/2012  . Memory loss 09/07/2012  . 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 Personal history of noncompliance with medical treatment, presenting hazards to health 05/25/2012  . Cerebral artery occlusion with cerebral infarction ([place]) 05/10/2012    Patient's Medications  New Prescriptions   No medications on file  Previous Medications   ACETAMINOPHEN (TYLENOL) 500 MG TABLET    Take 500 mg by mouth every 6 (six) hours as needed for headache (pain). pain    ASPIRIN 325 MG TABLET    Take 325 mg by mouth daily.   ATORVASTATIN (LIPITOR) 40 MG TABLET    Take 40 mg by mouth daily.   BLOOD GLUCOSE METER KIT AND SUPPLIES    Dispense based on patient and insurance preference. Use up to four times daily as directed. (FOR ICD-10 E10.9, E11.9).   CEFAZOLIN (ANCEF) IVPB    Inject 2 g into the vein every 8 (eight) hours. Indication:  Osteomyelitis of left foot with MSSA/Group B Strep  Last Day of Therapy:  08/13/19 Labs - Once weekly:  CBC/D and BMP, Labs - Every other week:  ESR and CRP Please remove PIC line after completion of antibiotic therapy.   CLOPIDOGREL (PLAVIX) 75 MG TABLET    TAKE 1 TABLET BY MOUTH DAILY   DAPAGLIFLOZIN PROPANEDIOL (FARXIGA) 10 MG TABS TABLET    Take 10 mg by mouth daily.    DIAZEPAM (VALIUM) 5 MG TABLET    Take 5 mg by mouth 2 (two) times daily.    DIPHENHYDRAMINE (BENADRYL) 25 MG TABLET    Take 50 mg by mouth See admin instructions. Take 2 tablets (50 mg) by mouth one hour prior to procedure involving contrast dye   DOCUSATE SODIUM (COLACE) 100 MG CAPSULE    Take 1 capsule (100 mg total) by mouth 2 (two) times daily.   DULOXETINE (CYMBALTA) 30 MG CAPSULE    Take 30 mg by mouth daily.   GABAPENTIN (NEURONTIN) 300 MG CAPSULE    TAKE THREE CAPSULES BY MOUTH EVERY MORNING and TAKE THREE CAPSULES BY MOUTH AT NOON and TAKE FOUR CAPSULES BY MOUTH AT BEDTIME   HYDROCODONE-ACETAMINOPHEN (NORCO) 10-325 MG TABLET    Take 1 tablet by mouth every 6 (six) hours as needed.   IBUPROFEN (ADVIL) 400 MG TABLET    Take 1 tablet (400 mg total) by mouth every 8 (eight) hours as needed for moderate pain.   LABETALOL (NORMODYNE) 300 MG TABLET    Take 300 mg by mouth 2 (two) times daily.   LEVETIRACETAM (KEPPRA XR) 500 MG 24 HR TABLET    Take 500 mg by mouth 2 (two) times daily.  LISINOPRIL (ZESTRIL) 40 MG TABLET    Take 40 mg by mouth daily.    METFORMIN (GLUCOPHAGE) 500 MG TABLET    Take 500 mg by mouth 2 (two) times daily.   METRONIDAZOLE (FLAGYL) 500 MG TABLET    Take 1 tablet (500 mg total) by mouth 3 (three) times daily. Last day of antibiotics: 08/13/19   NITROGLYCERIN (NITRODUR - DOSED IN MG/24 HR) 0.2 MG/HR PATCH    Place 1 patch (0.2 mg total) onto the skin daily. Place patch on foot in different spot daily   PANTOPRAZOLE (PROTONIX) 40 MG TABLET    Take 1 tablet (40 mg total) by mouth daily.   RISPERIDONE (RISPERDAL) 0.5 MG TABLET    Take 0.5 mg by mouth at bedtime.   SILVER SULFADIAZINE (SILVADENE) 1 % CREAM    Apply 1 application topically daily.   TRAZODONE (DESYREL) 100 MG TABLET    Take 200 mg by mouth at bedtime.     Modified Medications   No medications on file  Discontinued Medications   No medications on file    Subjective: Taylor [name] is in for her hospital follow-up visit.  She has diabetes and peripheral artery disease. She had a fall last September striking her left foot on the curb.  Her shoe came off and she suffered an abrasion on the lateral aspect and dorsum of her foot.  It caused immediate bleeding.  Several weeks later her foot became red, swollen and painful.  According to her mother, her PCP put her on oral antibiotics and her foot improved.  Her mother thinks she was on 3 other courses of oral antibiotics over the next few months.  Each time of the redness and swelling would improve but it never went away completely.  She started having darkening of the skin over the dorsum of her foot and her fifth toe leading to admission to [facility] in late December. MRI revealed osteomyelitis of the fifth metatarsal head.  She underwent I&D by general surgery on 06/12/2019.  There was a sinus tract and a small amount of purulence down to the fifth metatarsal.  They also aspirated a large abscess on the dorsum of her foot.  Cultures grew MSSA and strep agalactiae.  It was recognized that she was having limb threatening ischemia so she was transferred to [facility] on 06/23/2019 and underwent left femoral and popliteal artery atherectomies yesterday with PTA and stenting.  She underwent left fourth and fifth ray amputations with split thickness skin grafting of her dorsal wound on 07/03/2019.  She was discharged on IV cefazolin and oral metronidazole and has now completed 49 days of total antibiotic therapy (4 weeks postoperatively).   She has not had any problems tolerating her antibiotics.  She feels like her foot is looking better other than some new erythema around wound on the dorsum of her foot.  Yesterday her home nurse accidentally cut her PICC line.  The nurse remove the PICC line and placed a temporary  saline lock IV.  [name] went to the emergency department and stayed 8 hours but the ED providers did not replac the PICC.  Review of Systems: Review of Systems  Constitutional: Negative for chills, diaphoresis and fever.  Respiratory: Negative for cough.   Cardiovascular: Negative for chest pain.  Gastrointestinal: Negative for abdominal pain, diarrhea, nausea and vomiting.  Musculoskeletal: Positive for joint pain.  Skin: Negative for rash.  Neurological: Positive for sensory change.    Past Medical History:  Diagnosis Date  . Anxiety   . ARF (acute renal failure) ([place]) 2013  . Bipolar disorder ([place])   . Cerebral artery occlusion with cerebral infarction ([place]) 11/13   due to elevated blood sugar 1700-off insulin 6 months  . Depression   . Diabetes mellitus    off insulin 6 months  . Diverticulitis   . Dyspnea    when walking  . GERD (gastroesophageal reflux disease)   . Headache(784.0)   . Hypertension   . Kidney dialysis status    on dialysis(stroke) off after 4-6 weeks  . Pneumonia 2013, 06/2018  . Respiratory failure ([place]) 11/13   with stroke - in hosp [facility] 12 weeks  . Seizures ([place])    during elvated blood sugsr episode 11/13  . Sepsis ([employer])   . Stroke ([place])    11/2018, 12/2018 Weak left side, speech- slurred, Short term memeroy, Gait unsteady   . Vocal cord dysfunction     Social History   Tobacco Use  . Smoking status: Former Smoker    Packs/day: 0.60    Years: 23.00    Pack years: 13.80    Types: Cigarettes    Quit date: 02/05/2019    Years since quitting: 0.4  . Smokeless tobacco: Never Used  Substance Use Topics  . Alcohol use: No    Alcohol/week: 0.0 standard drinks  . Drug use: No    Family History  Problem Relation Age of Onset  . Lung cancer Maternal Grandmother   . Heart attack Maternal Grandfather   . Diabetes Paternal Grandfather     Allergies  Allergen Reactions  . Contrast Media [Iodinated Diagnostic Agents] Other (See Comments)     Went into kidney failure   . Amitriptyline Other (See Comments)    Unresponsive   . Compazine Other (See Comments)    Alert but unable to move  . Morphine And Related Other (See Comments)    Chest pain  . Oxycodone Nausea And Vomiting    Severe n/v -  patient will not take oxycodone or percocet.  . Prochlorperazine Other (See Comments)    Alert but unable to move  . Promethazine Swelling    Lips swell  . Sumatriptan Rash    Objective: Vitals:   07/31/19 1458  BP: 118/74  Pulse: 96  Temp: 97.6 F (36.4 C)  TempSrc: Oral  SpO2: 100%   There is no height or weight on file to calculate BMI.  Physical Exam Constitutional:      Comments: She is in good spirits and looks like she is feeling much better than when I last saw her in the hospital.  She is accompanied by her mother.  Musculoskeletal:     Comments: Her left foot fourth and fifth metatarsal rays and toes are surgically absent.  She has a large soft tissue defect in the dorsum of her foot with a skin graft.  There is surrounding erythema.  There is a moderate amount of yellow drainage on the gauze dressing.  Her mother stated that the drainage has improved recently.  Skin:    Findings: No rash.     Comments: Saline lock IV in her left antecubital vein.       Lab Results    Problem List Items Addressed This Visit      High   Diabetic foot infection ([place])    She seems to be improving other than the new redness around her dorsal wound.  I will have a new PICC placement  continue cefazolin with oral metronidazole for at least 2 more weeks.  Her routine blood work has not included inflammatory markers.  She will get blood work here today and follow-up in 2 weeks.      Relevant Orders   CBC   Basic metabolic panel   C-reactive protein   Sedimentation rate   IR Fluoro Guide CV Line Right       Michel Bickers, MD [facility] [place] [phone] pager   [phone]  cell 07/31/2019, 3:26 PM

## 2019-07-31 NOTE — Telephone Encounter (Signed)
Melissa with [facility] called office today for orders to maintain IV incase patient is unable to make it to IR appointment due to weather. Per Dr. Megan Salon okay to give orders. [place]

## 2019-07-31 NOTE — Assessment & Plan Note (Signed)
She seems to be improving other than the new redness around her dorsal wound.  I will have a new PICC placement continue cefazolin with oral metronidazole for at least 2 more weeks.  Her routine blood work has not included inflammatory markers.  She will get blood work here today and follow-up in 2 weeks.

## 2019-07-31 NOTE — Telephone Encounter (Signed)
Patient without PICC access, has temporary IV access placed by home health nurse.  Patient with work-in PICC appointment at [facility] 2/18 8:00 (arrive 7:45) - weather dependent.  RN left message with Anderson Malta in Interventional Radiology, asking for back up appointment for PICC placement next week.  RN gave patient's mother Jennifer's phone number to confirm or cancel next week [phone]). [facility] outpatient PICC team will go to [facility] 2/18 for picc placement (their number - [phone]). RN notified [facility] short stay, they will coordinate appointment for patient (phone: [phone], fax [phone]).  RN faxed PICC placement orders to [facility].   Patient's mother provided with all contact numbers. RN updated Melissa at [facility]. Landis Gandy, RN

## 2019-08-01 ENCOUNTER — Ambulatory Visit (HOSPITAL_COMMUNITY)
Admission: RE | Admit: 2019-08-01 | Discharge: 2019-08-01 | Disposition: A | Discharge status: Home / Self Care | Payer: Medicaid Other | Source: Ambulatory Visit | Attending: Internal Medicine | Admitting: Internal Medicine

## 2019-08-01 ENCOUNTER — Ambulatory Visit
Admission: RE | Admit: 2019-08-01 | Discharge: 2019-08-01 | Disposition: A | Discharge status: Home / Self Care | Payer: Self-pay | Source: Ambulatory Visit | Attending: Internal Medicine | Admitting: Internal Medicine

## 2019-08-01 DIAGNOSIS — M869 Osteomyelitis, unspecified: Secondary | ICD-10-CM | POA: Insufficient documentation

## 2019-08-01 LAB — CBC
HCT: 34.1 % — ABNORMAL LOW (ref 35.0–45.0)
Hemoglobin: 9.6 g/dL — ABNORMAL LOW (ref 11.7–15.5)
MCH: 21 pg — ABNORMAL LOW (ref 27.0–33.0)
MCHC: 28.2 g/dL — ABNORMAL LOW (ref 32.0–36.0)
MCV: 74.5 fL — ABNORMAL LOW (ref 80.0–100.0)
MPV: 10.2 fL (ref 7.5–12.5)
Platelets: 265 10*3/uL (ref 140–400)
RBC: 4.58 10*6/uL (ref 3.80–5.10)
RDW: 15.2 % — ABNORMAL HIGH (ref 11.0–15.0)
WBC: 5.2 10*3/uL (ref 3.8–10.8)

## 2019-08-01 LAB — BASIC METABOLIC PANEL
BUN: 13 mg/dL (ref 7–25)
CO2: 26 mmol/L (ref 20–32)
Calcium: 9.5 mg/dL (ref 8.6–10.2)
Chloride: 103 mmol/L (ref 98–110)
Creat: 0.85 mg/dL (ref 0.50–1.10)
Glucose, Bld: 329 mg/dL — ABNORMAL HIGH (ref 65–99)
Potassium: 4.3 mmol/L (ref 3.5–5.3)
Sodium: 141 mmol/L (ref 135–146)

## 2019-08-01 LAB — SEDIMENTATION RATE: Sed Rate: 36 mm/h — ABNORMAL HIGH (ref 0–20)

## 2019-08-01 LAB — C-REACTIVE PROTEIN: CRP: 6.5 mg/L (ref <8.0)

## 2019-08-01 NOTE — Progress Notes (Signed)
Peripherally Inserted Central Catheter/Midline Placement  The IV Nurse has discussed with the patient and/or persons authorized to consent for the patient, the purpose of this procedure and the potential benefits and risks involved with this procedure.  The benefits include less needle sticks, lab draws from the catheter, and the patient may be discharged home with the catheter. Risks include, but not limited to, infection, bleeding, blood clot (thrombus formation), and puncture of an artery; nerve damage and irregular heartbeat and possibility to perform a PICC exchange if needed/ordered by physician.  Alternatives to this procedure were also discussed.  Bard Power PICC patient education guide, fact sheet on infection prevention and patient information card has been provided to patient /or left at bedside.    PICC/Midline Placement Documentation  PICC Single Lumen 08/01/19 PICC Right Brachial 41 cm 1 cm (Active)  Indication for Insertion or Continuance of Line Home intravenous therapies (PICC only) 08/01/19 0900  Exposed Catheter (cm) 1 cm 08/01/19 0900  Site Assessment Clean;Dry;Intact 08/01/19 0900  Line Status Flushed;Saline locked;Blood return noted 08/01/19 0900  Dressing Type Transparent;Securing device 08/01/19 0900  Dressing Status Clean;Dry;Intact;Antimicrobial disc in place 08/01/19 0900  Dressing Change Due 08/08/19 08/01/19 0900       Holley Bouche Renee 08/01/2019, 9:35 AM

## 2019-08-05 ENCOUNTER — Telehealth: Payer: Self-pay | Admitting: *Deleted

## 2019-08-05 ENCOUNTER — Ambulatory Visit: Payer: Medicaid Other | Admitting: Physician Assistant

## 2019-08-05 NOTE — Addendum Note (Signed)
Addended by: Landis Gandy on: 08/05/2019 12:07 PM   Modules accepted: Orders

## 2019-08-05 NOTE — Telephone Encounter (Signed)
Spoke with Lattie Haw, Beth's mother.  Patient did receive a new PICC line on 2/18.  Will cancel PICC placement order at [facility]. Landis Gandy, RN

## 2019-08-06 ENCOUNTER — Encounter: Payer: Self-pay | Admitting: Physician Assistant

## 2019-08-06 ENCOUNTER — Other Ambulatory Visit: Payer: Self-pay

## 2019-08-06 ENCOUNTER — Inpatient Hospital Stay: Payer: Medicaid Other | Admitting: Internal Medicine

## 2019-08-06 ENCOUNTER — Ambulatory Visit (INDEPENDENT_AMBULATORY_CARE_PROVIDER_SITE_OTHER): Payer: Medicaid Other | Admitting: Physician Assistant

## 2019-08-06 VITALS — Ht 64.0 in | Wt 190.0 lb

## 2019-08-06 DIAGNOSIS — Z945 Skin transplant status: Secondary | ICD-10-CM

## 2019-08-06 MED ORDER — HYDROCODONE-ACETAMINOPHEN 10-325 MG PO TABS: 1.0000 | ORAL_TABLET | Freq: Four times a day (QID) | ORAL | 0 refills | Status: DC | PRN

## 2019-08-06 NOTE — Progress Notes (Signed)
Office Visit Note   Patient: Taylor [name]           Date of Birth: 08-05-1980           MRN: [id number] Visit Date: 08/06/2019              Requested by: Monico Blitz, MD [address] [place],  [place] [address] PCP: Monico Blitz, MD  Chief Complaint  Patient presents with  . Left Foot - Routine Post Op    07/03/19 left foot 4th and 5th ray amputation       HPI: Patient is a 39 year old woman who presents in follow-up status post lateral ray amputation of the left foot and split-thickness skin graft.  Patient is currently on antibiotics through a PICC line, ancef and flagyl.  Currently doing Silvadene dressing changes and nonweightbearing.  Continues with burning nerve like pain up lateral column and over lateral malleolus. Taking 2700 mg gabapentin daily with little relief.  Assessment & Plan: Visit Diagnoses:  1. S/P split thickness skin graft     Plan: Recommended elevation as well as range of motion of her ankle to help improve the circulation and decrease swelling.  Continue Silvadene dressing changes a prescription for Vicodin is called in. She will continue to hold on PT, is nonweight bearing.   Follow-Up Instructions: Return in about 2 weeks (around 08/20/2019).   Ortho Exam  Patient is alert, oriented, no adenopathy, well-dressed, normal affect, normal respiratory effort. Examination of the incision, it is well approximated there is no cellulitis no odor no drainage no signs of infection. The graft is in place, dry. There is fibrinous tissue in 90% of wound bed. Has some epithelialization across dorsum of foot wound edge. There is also an island of granulation in center of wound. Nonviable tissue debrided with gauze. Some bloody drainage.   Imaging: No results found. No images are attached to the encounter.  Labs: Lab Results  Component Value Date   HGBA1C 10.0 (H) 06/23/2019   ESRSEDRATE 36 (H) 07/31/2019   ESRSEDRATE 96 (H) 06/23/2019   CRP 6.5 07/31/2019   CRP  7.9 (H) 06/23/2019   REPTSTATUS 06/30/2019 FINAL 06/23/2019   CULT NO GROWTH 7 DAYS 06/23/2019     Lab Results  Component Value Date   ALBUMIN 2.9 (L) 07/01/2019   ALBUMIN 2.2 (L) 06/26/2019   ALBUMIN 3.6 03/02/2012   PREALBUMIN 17.3 (L) 06/23/2019    Lab Results  Component Value Date   MG 2.2 06/25/2019   MG 1.4 (L) 06/24/2019   No results found for: VD25OH  Lab Results  Component Value Date   PREALBUMIN 17.3 (L) 06/23/2019   CBC EXTENDED Latest Ref Rng & Units 07/31/2019 07/05/2019 07/04/2019  WBC 3.8 - 10.8 Thousand/uL 5.2 - -  RBC 3.80 - 5.10 Million/uL 4.58 - -  HGB 11.7 - 15.5 g/dL 9.6(L) 8.2(L) 7.8(L)  HCT 35.0 - 45.0 % 34.1(L) 29.8(L) 27.8(L)  PLT 140 - 400 Thousand/uL 265 - -  NEUTROABS 1.7 - 7.7 K/uL - - -  LYMPHSABS 0.7 - 4.0 K/uL - - -     Body mass index is 32.61 kg/m.  Orders:  No orders of the defined types were placed in this encounter.  No orders of the defined types were placed in this encounter.    Procedures: No procedures performed  Clinical Data: No additional findings.  ROS:  All other systems negative, except as noted in the HPI. Review of Systems  Constitutional: Negative for chills and  fever.  Cardiovascular: Negative for leg swelling.  Skin: Negative for wound.    Objective: Vital Signs: Ht 5\' 4"  (1.626 m)   Wt 190 lb (86.2 kg)   BMI 32.61 kg/m   Specialty Comments:  No specialty comments available.  PMFS History: Patient Active Problem List   Diagnosis Date Noted  . Obesity, Class III, BMI 40-49.9 (morbid obesity) ([place]) 07/05/2019  . Diabetic foot infection ([place]) 06/23/2019  . PAD (peripheral artery disease) ([place])   . Osteomyelitis of left foot ([place])   . Vertebral artery stenosis, left 03/25/2019  . VBI (vertebrobasilar insufficiency) 02/20/2019  . Intracranial carotid stenosis, left 01/23/2019  . 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 Tobacco use disorder  01/02/2015  . Cognitive impairment 01/02/2015  . Stroke ([place]) 09/07/2012  . Pain in upper limb 09/07/2012  . Pain in lower limb 09/07/2012  . Bipolar affective disorder ([place]) 09/07/2012  . Depressive disorder 09/07/2012  . Type II diabetes mellitus ([place]) 09/07/2012  . Memory loss 09/07/2012  . 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 Personal history of noncompliance with medical treatment, presenting hazards to health 05/25/2012  . Cerebral artery occlusion with cerebral infarction ([place]) 05/10/2012   Past Medical History:  Diagnosis Date  . Anxiety   . ARF (acute renal failure) ([place]) 2013  . Bipolar disorder ([place])   . Cerebral artery occlusion with cerebral infarction ([place]) 11/13   due to elevated blood sugar 1700-off insulin 6 months  . Depression   . Diabetes mellitus    off insulin 6 months  . Diverticulitis   . Dyspnea    when walking  . GERD (gastroesophageal reflux disease)   . Headache(784.0)   . Hypertension   . Kidney dialysis status    on dialysis(stroke) off after 4-6 weeks  . Pneumonia 2013, 06/2018  . Respiratory failure ([place]) 11/13   with stroke - in hosp [facility] 12 weeks  . Seizures ([place])    during elvated blood sugsr episode 11/13  . Sepsis ([place])   . Stroke ([place])    11/2018, 12/2018 Weak left side, speech- slurred, Short term memeroy, Gait unsteady   . Vocal cord dysfunction     Family History  Problem Relation Age of Onset  . Lung cancer Maternal Grandmother   . Heart attack Maternal Grandfather   . Diabetes Paternal Grandfather     Past Surgical History:  Procedure Laterality Date  . ABDOMINAL AORTOGRAM W/LOWER EXTREMITY N/A 06/25/2019   Procedure: ABDOMINAL AORTOGRAM W/LOWER EXTREMITY;  Surgeon: Serafina Mitchell, MD;  Location: [facility];  Service: Cardiovascular;  Laterality: N/A;  . AMPUTATION Left 07/03/2019   Procedure: LEFT FOOT 4TH AND 5TH RAY AMPUTATION;  Surgeon: Newt Minion, MD;  Location: [facility];  Service: Orthopedics;  Laterality: Left;   . APPENDECTOMY    . INSERTION OF DIALYSIS CATHETER  11/13   removed in 4-6 weeks  . IR ANGIO INTRA EXTRACRAN SEL COM CAROTID INNOMINATE BILAT MOD SED  02/07/2019  . IR ANGIO VERTEBRAL SEL VERTEBRAL BILAT MOD SED  02/07/2019  . IR ANGIOGRAM EXTREMITY LEFT  02/07/2019  . IR ANGIOGRAM EXTREMITY RIGHT  02/20/2019  . IR TRANSCATH EXCRAN VERT OR CAR A STENT  02/20/2019  . IR TRANSCATH EXCRAN VERT OR CAR A STENT  03/25/2019  . IR US GUIDE VASC ACCESS RIGHT  02/07/2019  . MULTIPLE EXTRACTIONS WITH ALVEOLOPLASTY N/A 02/24/2014   Procedure: MULTIPLE EXTRACTION WITH ALVEOLOPLASTY AND BIOPSY;  Surgeon: Deborra Medina  Hoyt Koch, DDS;  Location: [facility];  Service: Oral Surgery;  Laterality: N/A;  . PERIPHERAL VASCULAR ATHERECTOMY Left 06/25/2019   Procedure: PERIPHERAL VASCULAR ATHERECTOMY;  Surgeon: Serafina Mitchell, MD;  Location: [facility];  Service: Cardiovascular;  Laterality: Left;  superficial femoral  . RADIOLOGY WITH ANESTHESIA N/A 02/20/2019   Procedure: RADIOLOGY WITH ANESTHESIA   STENTING;  Surgeon: Luanne Bras, MD;  Location: [facility];  Service: Radiology;  Laterality: N/A;  . RADIOLOGY WITH ANESTHESIA N/A 03/25/2019   Procedure: RADIOLOGY WITH ANESTHESIA STENTING;  Surgeon: Luanne Bras, MD;  Location: [facility];  Service: Radiology;  Laterality: N/A;  . SKIN SPLIT GRAFT Left 07/03/2019   Procedure: APPLY SKIN GRAFT LEFT FOOT;  Surgeon: Newt Minion, MD;  Location: [facility];  Service: Orthopedics;  Laterality: Left;  . TRACHEOSTOMY  11/13   closed 1/14  . TRANSRECTAL DRAINAGE OF PELVIC ABSCESS  12/11   Social History   Occupational History  . Not on file  Tobacco Use  . Smoking status: Former Smoker    Packs/day: 0.60    Years: 23.00    Pack years: 13.80    Types: Cigarettes    Quit date: 02/05/2019    Years since quitting: 0.4  . Smokeless tobacco: Never Used  Substance and Sexual Activity  . Alcohol use: No    Alcohol/week: 0.0 standard drinks  . Drug use: No  . Sexual activity: Not  Currently    Birth control/protection: None

## 2019-08-12 ENCOUNTER — Other Ambulatory Visit: Payer: Self-pay | Admitting: Orthopedic Surgery

## 2019-08-12 ENCOUNTER — Telehealth: Payer: Self-pay

## 2019-08-12 ENCOUNTER — Telehealth: Payer: Self-pay | Admitting: Family

## 2019-08-12 MED ORDER — HYDROCODONE-ACETAMINOPHEN 10-325 MG PO TABS: 1.0000 | ORAL_TABLET | Freq: Four times a day (QID) | ORAL | 0 refills | Status: DC | PRN

## 2019-08-12 NOTE — Telephone Encounter (Signed)
Voicemail: Patient's daughter is calling to ensure IV antibiotics stop on 3- 07-2019 and office visit is 08-20-2019.  Return call to [place],  According to hospital discharge dates IV antibiotic end on 08-13-2019. Confirmed appointment with Dr Megan Salon on 08-20-2019.   Laverle Patter, RN

## 2019-08-12 NOTE — Telephone Encounter (Signed)
Patient's mother Lattie Haw called for a refill of daughter's hydrocodone. Please send to pharmacy on file. Patient phone number is [phone].

## 2019-08-12 NOTE — Telephone Encounter (Signed)
Pt is s/p a 4th and 5th ray amputation 07/03/19 requesting a refill on her hydrocodone 10/325 last refill on 07/29/19 #30 and 07/22/19 #28 please advise.

## 2019-08-12 NOTE — Telephone Encounter (Signed)
Rx sent 

## 2019-08-13 ENCOUNTER — Telehealth: Payer: Self-pay | Admitting: *Deleted

## 2019-08-13 NOTE — Telephone Encounter (Signed)
Patient's mother Lattie Haw called with a few concerns.  She states that the nurse never came today to draw labs or pull the PICC, that it was rescheduled without her knowledge to 3/4.  Lattie Haw is concerned that the labs drawn Thursday may indicate the need to continue antibiotics, is asking if the PICC can be maintained until the labs come back and she sees Dr Megan Salon 3/9.  Per Lattie Haw, the pain has decreased and the wound looks ok, but has a new greenish discharge and is more wet today.  RN called Coretta at [facility], gave verbal order per Dr Megan Salon to maintain the PICC until 3/9, asked for last labs to be faxed to [phone].  Per Coretta, last ESR drawn 2/24 = 71.  Rest of labs to follow. Landis Gandy, RN

## 2019-08-14 NOTE — Telephone Encounter (Signed)
Per Dr Megan Salon, RN relayed verbal order to [facility] to extend IV antibiotics, labs, and PICC care through her appointment 3/9. Patient's mother updated. Landis Gandy, RN

## 2019-08-19 ENCOUNTER — Telehealth: Payer: Self-pay

## 2019-08-19 NOTE — Telephone Encounter (Signed)
COVID-19 Pre-Screening Questions:08/19/19  Do you currently have a fever (>100 F), chills or unexplained body aches?NO  Are you currently experiencing new cough, shortness of breath, sore throat, runny nose?NO .  Marland Kitchen Have you recently travelled outside the state of [place] in the last 14 days?NO .  Have you been in contact with someone that is currently pending confirmation of Covid19 testing or has been confirmed to have the [place] virus?  NO   **If the patient answers NO to ALL questions -  advise the patient to please call the clinic before coming to the office should any symptoms develop.

## 2019-08-20 ENCOUNTER — Other Ambulatory Visit: Payer: Self-pay

## 2019-08-20 ENCOUNTER — Telehealth: Payer: Self-pay

## 2019-08-20 ENCOUNTER — Encounter: Payer: Self-pay | Admitting: Internal Medicine

## 2019-08-20 ENCOUNTER — Ambulatory Visit (INDEPENDENT_AMBULATORY_CARE_PROVIDER_SITE_OTHER): Payer: Medicaid Other | Admitting: Physician Assistant

## 2019-08-20 ENCOUNTER — Ambulatory Visit (INDEPENDENT_AMBULATORY_CARE_PROVIDER_SITE_OTHER): Payer: Medicaid Other | Admitting: Internal Medicine

## 2019-08-20 ENCOUNTER — Encounter: Payer: Self-pay | Admitting: Physician Assistant

## 2019-08-20 VITALS — Ht 64.0 in | Wt 190.0 lb

## 2019-08-20 DIAGNOSIS — Z945 Skin transplant status: Secondary | ICD-10-CM

## 2019-08-20 DIAGNOSIS — L089 Local infection of the skin and subcutaneous tissue, unspecified: Secondary | ICD-10-CM | POA: Diagnosis not present

## 2019-08-20 DIAGNOSIS — E11628 Type 2 diabetes mellitus with other skin complications: Secondary | ICD-10-CM

## 2019-08-20 MED ORDER — HYDROCODONE-ACETAMINOPHEN 10-325 MG PO TABS: 1.0000 | ORAL_TABLET | Freq: Four times a day (QID) | ORAL | 0 refills | Status: DC | PRN

## 2019-08-20 NOTE — Assessment & Plan Note (Signed)
I am hopeful that her left foot abscess and osteomyelitis have been cured through a combination of surgery and a long course of antibiotics.  She will need continued wound care for her dorsal wound but I am comfortable stopping antibiotics and having her PICC removed now.  She will follow-up in 4 to 6 weeks.

## 2019-08-20 NOTE — Telephone Encounter (Signed)
Called [facility] to inform them picc line was removed in office. Spoke with Jackelyn Poling who will inform home health. [place]

## 2019-08-20 NOTE — Progress Notes (Signed)
[facility] for Infectious Disease  Patient Active Problem List   Diagnosis Date Noted  . Diabetic foot infection ([place]) 06/23/2019    Priority: High  . Osteomyelitis of left foot ([place])     Priority: High  . Obesity, Class III, BMI 40-49.9 (morbid obesity) ([place]) 07/05/2019  . PAD (peripheral artery disease) ([place])   . Vertebral artery stenosis, left 03/25/2019  . VBI (vertebrobasilar insufficiency) 02/20/2019  . Intracranial carotid stenosis, left 01/23/2019  . 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 Tobacco use disorder 01/02/2015  . Cognitive impairment 01/02/2015  . Stroke ([place]) 09/07/2012  . Pain in upper limb 09/07/2012  . Pain in lower limb 09/07/2012  . Bipolar affective disorder ([place]) 09/07/2012  . Depressive disorder 09/07/2012  . Type II diabetes mellitus ([place]) 09/07/2012  . Memory loss 09/07/2012  . 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 Personal history of noncompliance with medical treatment, presenting hazards to health 05/25/2012  . Cerebral artery occlusion with cerebral infarction ([place]) 05/10/2012    Patient's Medications  New Prescriptions   No medications on file  Previous Medications   ACETAMINOPHEN (TYLENOL) 500 MG TABLET    Take 500 mg by mouth every 6 (six) hours as needed for headache (pain). pain    ASPIRIN 325 MG TABLET    Take 325 mg by mouth daily.   ATORVASTATIN (LIPITOR) 40 MG TABLET    Take 40 mg by mouth daily.   BLOOD GLUCOSE METER KIT AND SUPPLIES    Dispense based on patient and insurance preference. Use up to four times daily as directed. (FOR ICD-10 E10.9, E11.9).   CLOPIDOGREL (PLAVIX) 75 MG TABLET    TAKE 1 TABLET BY MOUTH DAILY   DAPAGLIFLOZIN PROPANEDIOL (FARXIGA) 10 MG TABS TABLET    Take 10 mg by mouth daily.    DIAZEPAM (VALIUM) 5 MG TABLET    Take 5 mg by mouth 2 (two) times daily.    DIPHENHYDRAMINE (BENADRYL) 25 MG TABLET    Take 50 mg by mouth See admin instructions.  Take 2 tablets (50 mg) by mouth one hour prior to procedure involving contrast dye   DOCUSATE SODIUM (COLACE) 100 MG CAPSULE    Take 1 capsule (100 mg total) by mouth 2 (two) times daily.   DULOXETINE (CYMBALTA) 30 MG CAPSULE    Take 30 mg by mouth daily.   GABAPENTIN (NEURONTIN) 300 MG CAPSULE    TAKE THREE CAPSULES BY MOUTH EVERY MORNING and TAKE THREE CAPSULES BY MOUTH AT NOON and TAKE FOUR CAPSULES BY MOUTH AT BEDTIME   HYDROCODONE-ACETAMINOPHEN (NORCO) 10-325 MG TABLET    Take 1 tablet by mouth every 6 (six) hours as needed.   IBUPROFEN (ADVIL) 400 MG TABLET    Take 1 tablet (400 mg total) by mouth every 8 (eight) hours as needed for moderate pain.   LABETALOL (NORMODYNE) 300 MG TABLET    Take 300 mg by mouth 2 (two) times daily.   LEVETIRACETAM (KEPPRA XR) 500 MG 24 HR TABLET    Take 500 mg by mouth 2 (two) times daily.    LISINOPRIL (ZESTRIL) 40 MG TABLET    Take 40 mg by mouth daily.    METFORMIN (GLUCOPHAGE) 500 MG TABLET    Take 500 mg by mouth 2 (two) times daily.   NITROGLYCERIN (NITRODUR - DOSED IN MG/24 HR) 0.2 MG/HR PATCH    Place 1 patch (0.2 mg total) onto the skin daily. Place  patch on foot in different spot daily   PANTOPRAZOLE (PROTONIX) 40 MG TABLET    Take 1 tablet (40 mg total) by mouth daily.   RISPERIDONE (RISPERDAL) 0.5 MG TABLET    Take 0.5 mg by mouth at bedtime.   SILVER SULFADIAZINE (SILVADENE) 1 % CREAM    Apply 1 application topically daily.   TRAZODONE (DESYREL) 100 MG TABLET    Take 200 mg by mouth at bedtime.   Modified Medications   No medications on file  Discontinued Medications   CEFAZOLIN (ANCEF) IVPB    Inject 2 g into the vein every 8 (eight) hours. Indication:  Osteomyelitis of left foot with MSSA/Group B Strep  Last Day of Therapy:  08/13/19 Labs - Once weekly:  CBC/D and BMP, Labs - Every other week:  ESR and CRP Please remove PIC line after completion of antibiotic therapy.   METRONIDAZOLE (FLAGYL) 500 MG TABLET    Take 1 tablet (500 mg total) by  mouth 3 (three) times daily. Last day of antibiotics: 08/13/19    Subjective: Taylor [name] is in with her mother for her routine follow-up visit.  She has now completed 69 days of IV cefazolin and oral metronidazole for her severe left diabetic foot infection.  She had a large dorsal abscess drained and fourth and fifth osteomyelitis.  She underwent fourth and fifth ray amputation and a split thickness skin graft over her dorsal wound.  She has not had any problems tolerating her PICC or antibiotics.  Review of Systems: Review of Systems  Constitutional: Negative for chills, diaphoresis and fever.  Respiratory: Negative for cough.   Cardiovascular: Negative for chest pain.  Gastrointestinal: Negative for abdominal pain, diarrhea, nausea and vomiting.  Musculoskeletal: Positive for joint pain.    Past Medical History:  Diagnosis Date  . Anxiety   . ARF (acute renal failure) ([place]) 2013  . Bipolar disorder ([place])   . Cerebral artery occlusion with cerebral infarction ([place]) 11/13   due to elevated blood sugar 1700-off insulin 6 months  . Depression   . Diabetes mellitus    off insulin 6 months  . Diverticulitis   . Dyspnea    when walking  . GERD (gastroesophageal reflux disease)   . Headache(784.0)   . Hypertension   . Kidney dialysis status    on dialysis(stroke) off after 4-6 weeks  . Pneumonia 2013, 06/2018  . Respiratory failure ([place]) 11/13   with stroke - in hosp ncbh 12 weeks  . Seizures ([place])    during elvated blood sugsr episode 11/13  . Sepsis ([place])   . Stroke ([place])    11/2018, 12/2018 Weak left side, speech- slurred, Short term memeroy, Gait unsteady   . Vocal cord dysfunction     Social History   Tobacco Use  . Smoking status: Former Smoker    Packs/day: 0.60    Years: 23.00    Pack years: 13.80    Types: Cigarettes    Quit date: 02/05/2019    Years since quitting: 0.5  . Smokeless tobacco: Never Used  Substance Use Topics  . Alcohol use: No    Alcohol/week: 0.0  standard drinks  . Drug use: No    Family History  Problem Relation Age of Onset  . Lung cancer Maternal Grandmother   . Heart attack Maternal Grandfather   . Diabetes Paternal Grandfather     Allergies  Allergen Reactions  . Contrast Media [Iodinated Diagnostic Agents] Other (See Comments)    Went into kidney failure   .  Amitriptyline Other (See Comments)    Unresponsive   . Compazine Other (See Comments)    Alert but unable to move  . Morphine And Related Other (See Comments)    Chest pain  . Oxycodone Nausea And Vomiting    Severe n/v -  patient will not take oxycodone or percocet.  . Prochlorperazine Other (See Comments)    Alert but unable to move  . Promethazine Swelling    Lips swell  . Sumatriptan Rash    Objective: Vitals:   08/20/19 1046  BP: 104/69  Pulse: 86  Temp: 97.7 F (36.5 C)  TempSrc: Oral   There is no height or weight on file to calculate BMI.  Physical Exam Constitutional:      Comments: Taylor [name] is appropriately worried about in good spirits.  She is seated in a wheelchair.  Musculoskeletal:     Comments: Her left fourth and fifth toes and metatarsal veins are surgically absent.  Her lateral surgical incision has healed nicely.  She continues to have a large soft tissue defect in wound over the dorsum of her foot.  The wound is slowly getting smaller.  There is about 50% granulation tissue and 50% yellow slough.  There is a slight rim of surrounding erythema but no fluctuance.  There is no malodor.  Skin:    Comments: Right arm PICC site looks good.     Problem List Items Addressed This Visit      High   Diabetic foot infection ([place])    I am hopeful that her left foot abscess and osteomyelitis have been cured through a combination of surgery and a long course of antibiotics.  She will need continued wound care for her dorsal wound but I am comfortable stopping antibiotics and having her PICC removed now.  She will follow-up in 4 to 6 weeks.            Michel Bickers, MD [facility] [phone] pager   [phone] cell 08/20/2019, 11:52 AM

## 2019-08-20 NOTE — Progress Notes (Signed)
Per verbal order from Dr Megan Salon, 41 cm Single Lumen Peripherally Inserted Central Catheter removed from right brachial, tip intact. No sutures present. RN confirmed length per chart. Dressing was clean and dry. Petroleum dressing applied. Pt advised no heavy lifting with this arm, leave dressing for 24 hours and call the office or seek emergent care if dressing becomes soaked with blood or sharp pain presents. Patient verbalized understanding and agreement.  Patient and her mother's questions answered to their satisfaction. Patient tolerated procedure well, RN walked patient to check out. [facility] and Cassie Kuppelweiser notified. Landis Gandy, RN

## 2019-08-20 NOTE — Progress Notes (Signed)
Office Visit Note   Patient: Taylor [name]           Date of Birth: 12/11/1980           MRN: [id number] Visit Date: 08/20/2019              Requested by: Monico Blitz, MD [address] [place],  [place] [address] PCP: Monico Blitz, MD  Chief Complaint  Patient presents with  . Left Foot - Routine Post Op    07/03/19 left foot 4th and 5th ray amputation STSG       HPI: Patient is 6 weeks s/p L4th and 5th ray amputation.  She has had a area that has been slow to heal.  She was prescribed a nitro patch but has not been compliant with wearing it because she says it burns.  She does not have any rash after removing it and her mother's been changing it not noticed anything unusual her PICC line was discontinued today  Assessment & Plan: Visit Diagnoses: No diagnosis found.  Plan: She will continue to do dressing changes we need to see her back next week.  I also asked for her to try the nitroglycerin patches once again.  Follow-Up Instructions: No follow-ups on file.   Ortho Exam  Patient is alert, oriented, no adenopathy, well-dressed, normal affect, normal respiratory effort. Focused examination of her left foot demonstrates ulcer today 2 2 cm deep.  There is fibrous tissue but there is some central granulation tissue that appears healthy.  She does have some exposed tendon deep.  There is no drainage there is no foul odor I have reviewed her mother's pictures and this is been consistent with the last couple weeks  Imaging: No results found. No images are attached to the encounter.  Labs: Lab Results  Component Value Date   HGBA1C 10.0 (H) 06/23/2019   ESRSEDRATE 36 (H) 07/31/2019   ESRSEDRATE 96 (H) 06/23/2019   CRP 6.5 07/31/2019   CRP 7.9 (H) 06/23/2019   REPTSTATUS 06/30/2019 FINAL 06/23/2019   CULT NO GROWTH 7 DAYS 06/23/2019     Lab Results  Component Value Date   ALBUMIN 2.9 (L) 07/01/2019   ALBUMIN 2.2 (L) 06/26/2019   ALBUMIN 3.6 03/02/2012   PREALBUMIN 17.3 (L)  06/23/2019    Lab Results  Component Value Date   MG 2.2 06/25/2019   MG 1.4 (L) 06/24/2019   No results found for: VD25OH  Lab Results  Component Value Date   PREALBUMIN 17.3 (L) 06/23/2019   CBC EXTENDED Latest Ref Rng & Units 07/31/2019 07/05/2019 07/04/2019  WBC 3.8 - 10.8 Thousand/uL 5.2 - -  RBC 3.80 - 5.10 Million/uL 4.58 - -  HGB 11.7 - 15.5 g/dL 9.6(L) 8.2(L) 7.8(L)  HCT 35.0 - 45.0 % 34.1(L) 29.8(L) 27.8(L)  PLT 140 - 400 Thousand/uL 265 - -  NEUTROABS 1.7 - 7.7 K/uL - - -  LYMPHSABS 0.7 - 4.0 K/uL - - -     Body mass index is 32.61 kg/m.  Orders:  No orders of the defined types were placed in this encounter.  No orders of the defined types were placed in this encounter.    Procedures: No procedures performed  Clinical Data: No additional findings.  ROS:  All other systems negative, except as noted in the HPI. Review of Systems  Objective: Vital Signs: Ht 5\' 4"  (1.626 m)   Wt 190 lb (86.2 kg)   BMI 32.61 kg/m   Specialty Comments:  No specialty  comments available.  PMFS History: Patient Active Problem List   Diagnosis Date Noted  . Obesity, Class III, BMI 40-49.9 (morbid obesity) ([place]) 07/05/2019  . Diabetic foot infection ([place]) 06/23/2019  . PAD (peripheral artery disease) ([place])   . Osteomyelitis of left foot ([place])   . Vertebral artery stenosis, left 03/25/2019  . VBI (vertebrobasilar insufficiency) 02/20/2019  . Intracranial carotid stenosis, left 01/23/2019  . 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 Tobacco use disorder 01/02/2015  . Cognitive impairment 01/02/2015  . Stroke ([place]) 09/07/2012  . Pain in upper limb 09/07/2012  . Pain in lower limb 09/07/2012  . Bipolar affective disorder ([place]) 09/07/2012  . Depressive disorder 09/07/2012  . Type II diabetes mellitus ([place]) 09/07/2012  . Memory loss 09/07/2012  . 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 Personal history of noncompliance  with medical treatment, presenting hazards to health 05/25/2012  . Cerebral artery occlusion with cerebral infarction ([place]) 05/10/2012   Past Medical History:  Diagnosis Date  . Anxiety   . ARF (acute renal failure) ([place]) 2013  . Bipolar disorder ([place])   . Cerebral artery occlusion with cerebral infarction ([place]) 11/13   due to elevated blood sugar 1700-off insulin 6 months  . Depression   . Diabetes mellitus    off insulin 6 months  . Diverticulitis   . Dyspnea    when walking  . GERD (gastroesophageal reflux disease)   . Headache(784.0)   . Hypertension   . Kidney dialysis status    on dialysis(stroke) off after 4-6 weeks  . Pneumonia 2013, 06/2018  . Respiratory failure ([place]) 11/13   with stroke - in hosp ncbh 12 weeks  . Seizures ([place])    during elvated blood sugsr episode 11/13  . Sepsis ([place])   . Stroke ([place])    11/2018, 12/2018 Weak left side, speech- slurred, Short term memeroy, Gait unsteady   . Vocal cord dysfunction     Family History  Problem Relation Age of Onset  . Lung cancer Maternal Grandmother   . Heart attack Maternal Grandfather   . Diabetes Paternal Grandfather     Past Surgical History:  Procedure Laterality Date  . ABDOMINAL AORTOGRAM W/LOWER EXTREMITY N/A 06/25/2019   Procedure: ABDOMINAL AORTOGRAM W/LOWER EXTREMITY;  Surgeon: Serafina Mitchell, MD;  Location: [facility];  Service: Cardiovascular;  Laterality: N/A;  . AMPUTATION Left 07/03/2019   Procedure: LEFT FOOT 4TH AND 5TH RAY AMPUTATION;  Surgeon: Newt Minion, MD;  Location: [facility];  Service: Orthopedics;  Laterality: Left;  . APPENDECTOMY    . INSERTION OF DIALYSIS CATHETER  11/13   removed in 4-6 weeks  . IR ANGIO INTRA EXTRACRAN SEL COM CAROTID INNOMINATE BILAT MOD SED  02/07/2019  . IR ANGIO VERTEBRAL SEL VERTEBRAL BILAT MOD SED  02/07/2019  . IR ANGIOGRAM EXTREMITY LEFT  02/07/2019  . IR ANGIOGRAM EXTREMITY RIGHT  02/20/2019  . IR TRANSCATH EXCRAN VERT OR CAR A STENT  02/20/2019  . IR  TRANSCATH EXCRAN VERT OR CAR A STENT  03/25/2019  . IR US GUIDE VASC ACCESS RIGHT  02/07/2019  . MULTIPLE EXTRACTIONS WITH ALVEOLOPLASTY N/A 02/24/2014   Procedure: MULTIPLE EXTRACTION WITH ALVEOLOPLASTY AND BIOPSY;  Surgeon: Gae Bon, DDS;  Location: [facility];  Service: Oral Surgery;  Laterality: N/A;  . PERIPHERAL VASCULAR ATHERECTOMY Left 06/25/2019   Procedure: PERIPHERAL VASCULAR ATHERECTOMY;  Surgeon: Serafina Mitchell, MD;  Location: [facility];  Service: Cardiovascular;  Laterality: Left;  superficial femoral  . RADIOLOGY WITH ANESTHESIA N/A 02/20/2019   Procedure: RADIOLOGY WITH ANESTHESIA   STENTING;  Surgeon: Luanne Bras, MD;  Location: [facility];  Service: Radiology;  Laterality: N/A;  . RADIOLOGY WITH ANESTHESIA N/A 03/25/2019   Procedure: RADIOLOGY WITH ANESTHESIA STENTING;  Surgeon: Luanne Bras, MD;  Location: [facility];  Service: Radiology;  Laterality: N/A;  . SKIN SPLIT GRAFT Left 07/03/2019   Procedure: APPLY SKIN GRAFT LEFT FOOT;  Surgeon: Newt Minion, MD;  Location: [facility];  Service: Orthopedics;  Laterality: Left;  . TRACHEOSTOMY  11/13   closed 1/14  . TRANSRECTAL DRAINAGE OF PELVIC ABSCESS  12/11   Social History   Occupational History  . Not on file  Tobacco Use  . Smoking status: Former Smoker    Packs/day: 0.60    Years: 23.00    Pack years: 13.80    Types: Cigarettes    Quit date: 02/05/2019    Years since quitting: 0.5  . Smokeless tobacco: Never Used  Substance and Sexual Activity  . Alcohol use: No    Alcohol/week: 0.0 standard drinks  . Drug use: No  . Sexual activity: Not Currently    Birth control/protection: None

## 2019-08-27 ENCOUNTER — Encounter: Payer: Self-pay | Admitting: Orthopedic Surgery

## 2019-08-27 ENCOUNTER — Other Ambulatory Visit: Payer: Self-pay

## 2019-08-27 ENCOUNTER — Ambulatory Visit (INDEPENDENT_AMBULATORY_CARE_PROVIDER_SITE_OTHER): Payer: Medicaid Other | Admitting: Orthopedic Surgery

## 2019-08-27 VITALS — Ht 64.0 in | Wt 190.0 lb

## 2019-08-27 DIAGNOSIS — Z945 Skin transplant status: Secondary | ICD-10-CM

## 2019-08-27 DIAGNOSIS — M86272 Subacute osteomyelitis, left ankle and foot: Secondary | ICD-10-CM

## 2019-08-27 MED ORDER — HYDROCODONE-ACETAMINOPHEN 10-325 MG PO TABS: 1.0000 | ORAL_TABLET | Freq: Four times a day (QID) | ORAL | 0 refills | Status: DC | PRN

## 2019-08-27 NOTE — Progress Notes (Signed)
Office Visit Note   Patient: Taylor [name]           Date of Birth: 1980/08/14           MRN: [id number] Visit Date: 08/27/2019              Requested by: Monico Blitz, MD [address] [place],  [place] [address] PCP: Monico Blitz, MD  Chief Complaint  Patient presents with  . Left Foot - Routine Post Op    07/03/19 left foot 4th and 5th ray amputation STSG      HPI: Patient is a 39 year old woman who was seen with her mother.  She is 2 months status post left foot fourth and fifth ray amputation as well as skin graft to the dorsum of the left foot.  Patient has been using Silvadene for dressing changes.  She is nonweightbearing in a wheelchair and a postoperative shoe.  Assessment & Plan: Visit Diagnoses:  1. S/P split thickness skin graft   2. Subacute osteomyelitis of left foot ([name])     Plan: We will transition her to medical compression stocking size medium.  She will wear these daily change daily wash with soap and water prescription for Vicodin.  Follow-Up Instructions: Return in about 4 weeks (around 09/24/2019).   Ortho Exam  Patient is alert, oriented, no adenopathy, well-dressed, normal affect, normal respiratory effort. Examination patient has approximately three quarters of the wound has healthy granulation tissue one quarter has fibrinous exudative tissue there is no surrounding cellulitis odor or drainage.  Patient's calf is 34 cm in circumference.  Imaging: No results found. No images are attached to the encounter.  Labs: Lab Results  Component Value Date   HGBA1C 10.0 (H) 06/23/2019   ESRSEDRATE 36 (H) 07/31/2019   ESRSEDRATE 96 (H) 06/23/2019   CRP 6.5 07/31/2019   CRP 7.9 (H) 06/23/2019   REPTSTATUS 06/30/2019 FINAL 06/23/2019   CULT NO GROWTH 7 DAYS 06/23/2019     Lab Results  Component Value Date   ALBUMIN 2.9 (L) 07/01/2019   ALBUMIN 2.2 (L) 06/26/2019   ALBUMIN 3.6 03/02/2012   PREALBUMIN 17.3 (L) 06/23/2019    Lab Results  Component Value  Date   MG 2.2 06/25/2019   MG 1.4 (L) 06/24/2019   No results found for: VD25OH  Lab Results  Component Value Date   PREALBUMIN 17.3 (L) 06/23/2019   CBC EXTENDED Latest Ref Rng & Units 07/31/2019 07/05/2019 07/04/2019  WBC 3.8 - 10.8 Thousand/uL 5.2 - -  RBC 3.80 - 5.10 Million/uL 4.58 - -  HGB 11.7 - 15.5 g/dL 9.6(L) 8.2(L) 7.8(L)  HCT 35.0 - 45.0 % 34.1(L) 29.8(L) 27.8(L)  PLT 140 - 400 Thousand/uL 265 - -  NEUTROABS 1.7 - 7.7 K/uL - - -  LYMPHSABS 0.7 - 4.0 K/uL - - -     Body mass index is 32.61 kg/m.  Orders:  No orders of the defined types were placed in this encounter.  Meds ordered this encounter  Medications  . HYDROcodone-acetaminophen (NORCO) 10-325 MG tablet    Sig: Take 1 tablet by mouth every 6 (six) hours as needed.    Dispense:  20 tablet    Refill:  0     Procedures: No procedures performed  Clinical Data: No additional findings.  ROS:  All other systems negative, except as noted in the HPI. Review of Systems  Objective: Vital Signs: Ht 5\' 4"  (1.626 m)   Wt 190 lb (86.2 kg)   BMI  32.61 kg/m   Specialty Comments:  No specialty comments available.  PMFS History: Patient Active Problem List   Diagnosis Date Noted  . Obesity, Class III, BMI 40-49.9 (morbid obesity) ([place]) 07/05/2019  . Diabetic foot infection ([place]) 06/23/2019  . PAD (peripheral artery disease) ([place])   . Osteomyelitis of left foot ([place])   . Vertebral artery stenosis, left 03/25/2019  . VBI (vertebrobasilar insufficiency) 02/20/2019  . Intracranial carotid stenosis, left 01/23/2019  . 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 Tobacco use disorder 01/02/2015  . Cognitive impairment 01/02/2015  . Stroke ([place]) 09/07/2012  . Pain in upper limb 09/07/2012  . Pain in lower limb 09/07/2012  . Bipolar affective disorder ([place]) 09/07/2012  . Depressive disorder 09/07/2012  . Type II diabetes mellitus ([place]) 09/07/2012  . Memory  loss 09/07/2012  . 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 Personal history of noncompliance with medical treatment, presenting hazards to health 05/25/2012  . Cerebral artery occlusion with cerebral infarction ([place]) 05/10/2012   Past Medical History:  Diagnosis Date  . Anxiety   . ARF (acute renal failure) ([place]) 2013  . Bipolar disorder ([place])   . Cerebral artery occlusion with cerebral infarction ([place]) 11/13   due to elevated blood sugar 1700-off insulin 6 months  . Depression   . Diabetes mellitus    off insulin 6 months  . Diverticulitis   . Dyspnea    when walking  . GERD (gastroesophageal reflux disease)   . Headache(784.0)   . Hypertension   . Kidney dialysis status    on dialysis(stroke) off after 4-6 weeks  . Pneumonia 2013, 06/2018  . Respiratory failure ([place]) 11/13   with stroke - in hosp ncbh 12 weeks  . Seizures ([place])    during elvated blood sugsr episode 11/13  . Sepsis ([place])   . Stroke ([place])    11/2018, 12/2018 Weak left side, speech- slurred, Short term memeroy, Gait unsteady   . Vocal cord dysfunction     Family History  Problem Relation Age of Onset  . Lung cancer Maternal Grandmother   . Heart attack Maternal Grandfather   . Diabetes Paternal Grandfather     Past Surgical History:  Procedure Laterality Date  . ABDOMINAL AORTOGRAM W/LOWER EXTREMITY N/A 06/25/2019   Procedure: ABDOMINAL AORTOGRAM W/LOWER EXTREMITY;  Surgeon: Serafina Mitchell, MD;  Location: [facility];  Service: Cardiovascular;  Laterality: N/A;  . AMPUTATION Left 07/03/2019   Procedure: LEFT FOOT 4TH AND 5TH RAY AMPUTATION;  Surgeon: Newt Minion, MD;  Location: [facility];  Service: Orthopedics;  Laterality: Left;  . APPENDECTOMY    . INSERTION OF DIALYSIS CATHETER  11/13   removed in 4-6 weeks  . IR ANGIO INTRA EXTRACRAN SEL COM CAROTID INNOMINATE BILAT MOD SED  02/07/2019  . IR ANGIO VERTEBRAL SEL VERTEBRAL BILAT MOD SED  02/07/2019  . IR ANGIOGRAM EXTREMITY LEFT  02/07/2019  . IR  ANGIOGRAM EXTREMITY RIGHT  02/20/2019  . IR TRANSCATH EXCRAN VERT OR CAR A STENT  02/20/2019  . IR TRANSCATH EXCRAN VERT OR CAR A STENT  03/25/2019  . IR US GUIDE VASC ACCESS RIGHT  02/07/2019  . MULTIPLE EXTRACTIONS WITH ALVEOLOPLASTY N/A 02/24/2014   Procedure: MULTIPLE EXTRACTION WITH ALVEOLOPLASTY AND BIOPSY;  Surgeon: Gae Bon, DDS;  Location: [facility];  Service: Oral Surgery;  Laterality: N/A;  . PERIPHERAL VASCULAR ATHERECTOMY Left 06/25/2019   Procedure: PERIPHERAL VASCULAR ATHERECTOMY;  Surgeon: Serafina Mitchell, MD;  Location: [facility];  Service: Cardiovascular;  Laterality: Left;  superficial femoral  . RADIOLOGY WITH ANESTHESIA N/A 02/20/2019   Procedure: RADIOLOGY WITH ANESTHESIA   STENTING;  Surgeon: Luanne Bras, MD;  Location: [facility];  Service: Radiology;  Laterality: N/A;  . RADIOLOGY WITH ANESTHESIA N/A 03/25/2019   Procedure: RADIOLOGY WITH ANESTHESIA STENTING;  Surgeon: Luanne Bras, MD;  Location: [facility];  Service: Radiology;  Laterality: N/A;  . SKIN SPLIT GRAFT Left 07/03/2019   Procedure: APPLY SKIN GRAFT LEFT FOOT;  Surgeon: Newt Minion, MD;  Location: [facility];  Service: Orthopedics;  Laterality: Left;  . TRACHEOSTOMY  11/13   closed 1/14  . TRANSRECTAL DRAINAGE OF PELVIC ABSCESS  12/11   Social History   Occupational History  . Not on file  Tobacco Use  . Smoking status: Former Smoker    Packs/day: 0.60    Years: 23.00    Pack years: 13.80    Types: Cigarettes    Quit date: 02/05/2019    Years since quitting: 0.5  . Smokeless tobacco: Never Used  Substance and Sexual Activity  . Alcohol use: No    Alcohol/week: 0.0 standard drinks  . Drug use: No  . Sexual activity: Not Currently    Birth control/protection: None

## 2019-08-29 ENCOUNTER — Other Ambulatory Visit: Payer: Self-pay

## 2019-08-29 ENCOUNTER — Telehealth (INDEPENDENT_AMBULATORY_CARE_PROVIDER_SITE_OTHER): Payer: Medicaid Other | Admitting: Neurology

## 2019-08-29 ENCOUNTER — Encounter: Payer: Self-pay | Admitting: Neurology

## 2019-08-29 DIAGNOSIS — I679 Cerebrovascular disease, unspecified: Secondary | ICD-10-CM

## 2019-08-29 DIAGNOSIS — G890 Central pain syndrome: Secondary | ICD-10-CM | POA: Diagnosis not present

## 2019-08-29 DIAGNOSIS — I639 Cerebral infarction, unspecified: Secondary | ICD-10-CM

## 2019-08-29 MED ORDER — GABAPENTIN 300 MG PO CAPS: 900.0000 mg | ORAL_CAPSULE | Freq: Three times a day (TID) | ORAL | 1 refills | Status: DC

## 2019-08-29 NOTE — Progress Notes (Signed)
Virtual Visit via Video Note The purpose of this virtual visit is to provide medical care while limiting exposure to the novel coronavirus.    Consent was obtained for video visit:  Yes.   Answered questions that patient had about telehealth interaction:  Yes.   I discussed the limitations, risks, security and privacy concerns of performing an evaluation and management service by telemedicine. I also discussed with the patient that there may be a patient responsible charge related to this service. The patient expressed understanding and agreed to proceed.  Pt location: Home Physician Location: office Name of referring provider:  Monico Blitz, MD I connected with Lavona Mound at patients initiation/request on 08/29/2019 at  9:30 AM EDT by video enabled telemedicine application and verified that I am speaking with the correct person using two identifiers. Pt MRN:  [id number] Pt DOB:  02/18/1981 Video Participants:  Lavona Mound   History of Present Illness: This is a 39 y.o. 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 She was evaluated by Dr. Estanislado Pandy and had cerebral angiogram showing proximal severe left vertebral stenosis s/p stent and 60% LICA at the petrous segment and 70% at left cavernous segment. R PCA occluded, 50% left vertebrobasilar stenosis at the junction. She was not contacted for follow-up after this procedure.  She denies any new neurological concerns or events.  Her speech has improved.  In January, she developed left 4th and 5th toe gangrene and underwent amputation and is recovering from this.  Observations/Objective:   There were no vitals filed for this visit. Patient is awake, alert, and appears comfortable.  Oriented x 4.   Extraocular muscles are intact. No ptosis.  Face is symmetric.  Speech is not dysarthric.  Antigravity in all extremities.   No pronator drift. Gait not tested   Lab Results  Component Value Date   HGBA1C 10.0 (H) 06/23/2019   Lab Results  Component Value Date   CHOL 119 01/24/2019   HDL 30 (L) 01/24/2019   LDLCALC 35 01/24/2019   TRIG 270 (H) 01/24/2019   CHOLHDL 4.0 01/24/2019     Assessment and Plan:  Intracranial stenosis with history of multiple strokes (R PCA and bithalamic stroke 2013, left thalamic stroke 12/2018, and left paramedial pontine stroke 11/2018).  Risk factors include poorly controlled diabetes mellitus, hypertriglyceridemia.  BP is well-controlled.  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  She underwent cerebral angiogram in August 2021 which showed severe left vertebral stenosis at the origin which was stented.  She continues to have 63-01% LICA stenosis distally and 50% left vertebrobasilar junction stenosis as well as 50% at the RICA cavernous segment. - Continue maximal medical therapy with aspirin 81mg  + plavix 75mg  daily - Continue lipitor 40mg  daily (LDL 35) - BP well-controlled - Follow-up with Dr. Arlean Hopping office s/p left vertebral stenting, she did not have post-procedure follow-up - Refills provided for gabapentin 900mg  TID  Seizure disorder, stable on Keppra 500mg  BID  Follow Up Instructions:   I discussed the assessment and treatment plan with the patient. The patient was provided an opportunity to ask questions and all were answered. The patient agreed with the plan and demonstrated an understanding of the instructions.   The patient was advised to call back or seek an in-person evaluation if the symptoms worsen or if the condition fails to improve as anticipated.  Follow-up in  8 months   Alda Berthold, DO

## 2019-08-29 NOTE — Progress Notes (Signed)
Hospital from Dec 29th Jan 22nd, vascular surgery, 2 toes amputated and part of her foot

## 2019-09-02 ENCOUNTER — Other Ambulatory Visit: Payer: Self-pay | Admitting: Physician Assistant

## 2019-09-03 ENCOUNTER — Other Ambulatory Visit: Payer: Self-pay | Admitting: Physician Assistant

## 2019-09-03 ENCOUNTER — Encounter: Payer: Self-pay | Admitting: Orthopedic Surgery

## 2019-09-03 MED ORDER — HYDROCODONE-ACETAMINOPHEN 5-325 MG PO TABS: ORAL_TABLET | ORAL | 0 refills | Status: DC

## 2019-09-09 ENCOUNTER — Encounter: Payer: Self-pay | Admitting: Orthopedic Surgery

## 2019-09-09 ENCOUNTER — Other Ambulatory Visit: Payer: Self-pay | Admitting: Physician Assistant

## 2019-09-09 MED ORDER — HYDROCODONE-ACETAMINOPHEN 5-325 MG PO TABS: ORAL_TABLET | ORAL | 0 refills | Status: DC

## 2019-09-20 ENCOUNTER — Other Ambulatory Visit: Payer: Self-pay | Admitting: Physician Assistant

## 2019-09-20 ENCOUNTER — Encounter: Payer: Self-pay | Admitting: Orthopedic Surgery

## 2019-09-20 MED ORDER — HYDROCODONE-ACETAMINOPHEN 5-325 MG PO TABS: ORAL_TABLET | ORAL | 0 refills | Status: DC

## 2019-09-24 ENCOUNTER — Other Ambulatory Visit: Payer: Self-pay

## 2019-09-24 ENCOUNTER — Ambulatory Visit (INDEPENDENT_AMBULATORY_CARE_PROVIDER_SITE_OTHER): Payer: Medicaid Other | Admitting: Physician Assistant

## 2019-09-24 VITALS — Ht 64.0 in | Wt 190.0 lb

## 2019-09-24 DIAGNOSIS — Z945 Skin transplant status: Secondary | ICD-10-CM

## 2019-09-24 MED ORDER — HYDROCODONE-ACETAMINOPHEN 5-325 MG PO TABS: ORAL_TABLET | ORAL | 0 refills | Status: DC

## 2019-09-24 NOTE — Progress Notes (Signed)
Office Visit Note   Patient: Taylor [name]           Date of Birth: 15-Mar-1981           MRN: [id number] Visit Date: 09/24/2019              Requested by: Monico Blitz, MD [address] [place],  [place] [address] PCP: Monico Blitz, MD  Chief Complaint  Patient presents with  . Left Foot - Routine Post Op    07/03/19 left foot 4th and 5th ray amputation ST[facility]      HPI: Patient is now 11 weeks status post left foot fourth and fifth ray amputations with skin grafting.  Her mother has been doing her dressing changes.  She has been nonweightbearing in a wheelchair.  Her biggest complaint is of burning pain to light touch across the side of her foot.  This is present even when she is nonweightbearing and wakes her up sometimes at night.  She is on gabapentin.  She is also complaining of some calf cramping.  Assessment & Plan: Visit Diagnoses: No diagnosis found.  Plan: I will have her mom do some heel cord stretching.  When I demonstrate this and do passive dorsiflexion here in the office she states that makes her legs feel a lot better.  Also some light desensitization over the lateral aspect of the wound Follow-Up Instructions: No follow-ups on file.   Ortho Exam  Patient is alert, oriented, no adenopathy, well-dressed, normal affect, normal respiratory effort. Incision has healed.  Area of skin graft has healthy fibrous tissue.  No surrounding erythema.  Her calf is nontender to deep palpation.  Negative Homans' sign.  In fact this makes her feel better.  She does have a moderate amount of calf atrophy from nonweightbearing  Imaging: No results found. No images are attached to the encounter.  Labs: Lab Results  Component Value Date   HGBA1C 10.0 (H) 06/23/2019   ESRSEDRATE 36 (H) 07/31/2019   ESRSEDRATE 96 (H) 06/23/2019   CRP 6.5 07/31/2019   CRP 7.9 (H) 06/23/2019   REPTSTATUS 06/30/2019 FINAL 06/23/2019   CULT NO GROWTH 7 DAYS 06/23/2019     Lab Results  Component Value Date    ALBUMIN 2.9 (L) 07/01/2019   ALBUMIN 2.2 (L) 06/26/2019   ALBUMIN 3.6 03/02/2012   PREALBUMIN 17.3 (L) 06/23/2019    Lab Results  Component Value Date   MG 2.2 06/25/2019   MG 1.4 (L) 06/24/2019   No results found for: VD25OH  Lab Results  Component Value Date   PREALBUMIN 17.3 (L) 06/23/2019   CBC EXTENDED Latest Ref Rng & Units 07/31/2019 07/05/2019 07/04/2019  WBC 3.8 - 10.8 Thousand/uL 5.2 - -  RBC 3.80 - 5.10 Million/uL 4.58 - -  HGB 11.7 - 15.5 g/dL 9.6(L) 8.2(L) 7.8(L)  HCT 35.0 - 45.0 % 34.1(L) 29.8(L) 27.8(L)  PLT 140 - 400 Thousand/uL 265 - -  NEUTROABS 1.7 - 7.7 K/uL - - -  LYMPHSABS 0.7 - 4.0 K/uL - - -     Body mass index is 32.61 kg/m.  Orders:  No orders of the defined types were placed in this encounter.  Meds ordered this encounter  Medications  . HYDROcodone-acetaminophen (NORCO/VICODIN) 5-325 MG tablet    Sig: TAKE 1 PO Q 8 HOURS AS NEEDED FOR PAIN    Dispense:  30 tablet    Refill:  0     Procedures: No procedures performed  Clinical Data: No additional findings.  ROS:  All other systems negative, except as noted in the HPI. Review of Systems  Objective: Vital Signs: Ht 5\' 4"  (1.626 m)   Wt 190 lb (86.2 kg)   BMI 32.61 kg/m   Specialty Comments:  No specialty comments available.  PMFS History: Patient Active Problem List   Diagnosis Date Noted  . Obesity, Class III, BMI 40-49.9 (morbid obesity) ([place]) 07/05/2019  . Diabetic foot infection ([place]) 06/23/2019  . PAD (peripheral artery disease) ([place])   . Osteomyelitis of left foot ([place])   . Vertebral artery stenosis, left 03/25/2019  . VBI (vertebrobasilar insufficiency) 02/20/2019  . Intracranial carotid stenosis, left 01/23/2019  . 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 Tobacco use disorder 01/02/2015  . Cognitive impairment 01/02/2015  . Stroke ([place]) 09/07/2012  . Pain in upper limb 09/07/2012  . Pain in lower limb  09/07/2012  . Bipolar affective disorder ([place]) 09/07/2012  . Depressive disorder 09/07/2012  . Type II diabetes mellitus ([place]) 09/07/2012  . Memory loss 09/07/2012  . 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 Personal history of noncompliance with medical treatment, presenting hazards to health 05/25/2012  . Cerebral artery occlusion with cerebral infarction ([place]) 05/10/2012   Past Medical History:  Diagnosis Date  . Anxiety   . ARF (acute renal failure) ([place]) 2013  . Bipolar disorder ([place])   . Cerebral artery occlusion with cerebral infarction ([place]) 11/13   due to elevated blood sugar 1700-off insulin 6 months  . Depression   . Diabetes mellitus    off insulin 6 months  . Diverticulitis   . Dyspnea    when walking  . GERD (gastroesophageal reflux disease)   . Headache(784.0)   . Hypertension   . Kidney dialysis status    on dialysis(stroke) off after 4-6 weeks  . Pneumonia 2013, 06/2018  . Respiratory failure ([place]) 11/13   with stroke - in hosp [facility] 12 weeks  . Seizures ([place])    during elvated blood sugsr episode 11/13  . Sepsis ([place])   . Stroke ([place])    11/2018, 12/2018 Weak left side, speech- slurred, Short term memeroy, Gait unsteady   . Vocal cord dysfunction     Family History  Problem Relation Age of Onset  . Lung cancer Maternal Grandmother   . Heart attack Maternal Grandfather   . Diabetes Paternal Grandfather     Past Surgical History:  Procedure Laterality Date  . ABDOMINAL AORTOGRAM W/LOWER EXTREMITY N/A 06/25/2019   Procedure: ABDOMINAL AORTOGRAM W/LOWER EXTREMITY;  Surgeon: Serafina Mitchell, MD;  Location: [facility];  Service: Cardiovascular;  Laterality: N/A;  . AMPUTATION Left 07/03/2019   Procedure: LEFT FOOT 4TH AND 5TH RAY AMPUTATION;  Surgeon: Newt Minion, MD;  Location: [facility];  Service: Orthopedics;  Laterality: Left;  . APPENDECTOMY    . INSERTION OF DIALYSIS CATHETER  11/13   removed in 4-6 weeks  . IR ANGIO INTRA EXTRACRAN SEL COM CAROTID  INNOMINATE BILAT MOD SED  02/07/2019  . IR ANGIO VERTEBRAL SEL VERTEBRAL BILAT MOD SED  02/07/2019  . IR ANGIOGRAM EXTREMITY LEFT  02/07/2019  . IR ANGIOGRAM EXTREMITY RIGHT  02/20/2019  . IR TRANSCATH EXCRAN VERT OR CAR A STENT  02/20/2019  . IR TRANSCATH EXCRAN VERT OR CAR A STENT  03/25/2019  . IR US GUIDE VASC ACCESS RIGHT  02/07/2019  . MULTIPLE EXTRACTIONS WITH ALVEOLOPLASTY N/A 02/24/2014   Procedure: MULTIPLE EXTRACTION WITH ALVEOLOPLASTY AND BIOPSY;  Surgeon: Nicki Reaper  Parthenia Ames, DDS;  Location: [facility];  Service: Oral Surgery;  Laterality: N/A;  . PERIPHERAL VASCULAR ATHERECTOMY Left 06/25/2019   Procedure: PERIPHERAL VASCULAR ATHERECTOMY;  Surgeon: Serafina Mitchell, MD;  Location: [facility];  Service: Cardiovascular;  Laterality: Left;  superficial femoral  . RADIOLOGY WITH ANESTHESIA N/A 02/20/2019   Procedure: RADIOLOGY WITH ANESTHESIA   STENTING;  Surgeon: Luanne Bras, MD;  Location: [facility];  Service: Radiology;  Laterality: N/A;  . RADIOLOGY WITH ANESTHESIA N/A 03/25/2019   Procedure: RADIOLOGY WITH ANESTHESIA STENTING;  Surgeon: Luanne Bras, MD;  Location: [facility];  Service: Radiology;  Laterality: N/A;  . SKIN SPLIT GRAFT Left 07/03/2019   Procedure: APPLY SKIN GRAFT LEFT FOOT;  Surgeon: Newt Minion, MD;  Location: [facility];  Service: Orthopedics;  Laterality: Left;  . TRACHEOSTOMY  11/13   closed 1/14  . TRANSRECTAL DRAINAGE OF PELVIC ABSCESS  12/11   Social History   Occupational History  . Not on file  Tobacco Use  . Smoking status: Former Smoker    Packs/day: 0.60    Years: 23.00    Pack years: 13.80    Types: Cigarettes    Quit date: 02/05/2019    Years since quitting: 0.6  . Smokeless tobacco: Never Used  Substance and Sexual Activity  . Alcohol use: No    Alcohol/week: 0.0 standard drinks  . Drug use: No  . Sexual activity: Not Currently    Birth control/protection: None

## 2019-09-30 ENCOUNTER — Other Ambulatory Visit (HOSPITAL_COMMUNITY): Payer: Self-pay | Admitting: Interventional Radiology

## 2019-09-30 DIAGNOSIS — I6502 Occlusion and stenosis of left vertebral artery: Secondary | ICD-10-CM

## 2019-10-03 ENCOUNTER — Encounter: Payer: Self-pay | Admitting: Orthopedic Surgery

## 2019-10-03 ENCOUNTER — Other Ambulatory Visit: Payer: Self-pay | Admitting: Physician Assistant

## 2019-10-03 MED ORDER — HYDROCODONE-ACETAMINOPHEN 5-325 MG PO TABS: 1.0000 | ORAL_TABLET | Freq: Two times a day (BID) | ORAL | 0 refills | Status: DC

## 2019-10-09 ENCOUNTER — Encounter: Payer: Self-pay | Admitting: Orthopedic Surgery

## 2019-10-09 MED ORDER — HYDROCODONE-ACETAMINOPHEN 5-325 MG PO TABS: 1.0000 | ORAL_TABLET | Freq: Two times a day (BID) | ORAL | 0 refills | Status: DC

## 2019-10-14 ENCOUNTER — Ambulatory Visit (INDEPENDENT_AMBULATORY_CARE_PROVIDER_SITE_OTHER): Payer: Medicaid Other | Admitting: Physician Assistant

## 2019-10-14 ENCOUNTER — Ambulatory Visit: Payer: Self-pay

## 2019-10-14 ENCOUNTER — Ambulatory Visit (HOSPITAL_COMMUNITY)
Admission: RE | Admit: 2019-10-14 | Discharge: 2019-10-14 | Disposition: A | Discharge status: Home / Self Care | Payer: Medicaid Other | Source: Ambulatory Visit | Attending: Interventional Radiology | Admitting: Interventional Radiology

## 2019-10-14 ENCOUNTER — Other Ambulatory Visit: Payer: Self-pay

## 2019-10-14 ENCOUNTER — Encounter: Payer: Self-pay | Admitting: Orthopedic Surgery

## 2019-10-14 ENCOUNTER — Encounter: Payer: Self-pay | Admitting: Surgery

## 2019-10-14 ENCOUNTER — Ambulatory Visit (INDEPENDENT_AMBULATORY_CARE_PROVIDER_SITE_OTHER)
Admission: RE | Admit: 2019-10-14 | Discharge: 2019-10-14 | Disposition: A | Discharge status: Home / Self Care | Payer: Medicaid Other | Source: Ambulatory Visit | Attending: Surgery | Admitting: Surgery

## 2019-10-14 ENCOUNTER — Ambulatory Visit (INDEPENDENT_AMBULATORY_CARE_PROVIDER_SITE_OTHER): Payer: Medicaid Other | Admitting: Surgery

## 2019-10-14 ENCOUNTER — Ambulatory Visit (HOSPITAL_COMMUNITY)
Admission: RE | Admit: 2019-10-14 | Discharge: 2019-10-14 | Disposition: A | Discharge status: Home / Self Care | Payer: Medicaid Other | Source: Ambulatory Visit | Attending: Surgery | Admitting: Surgery

## 2019-10-14 ENCOUNTER — Telehealth: Payer: Self-pay | Admitting: Student

## 2019-10-14 VITALS — BP 165/72 | HR 95 | Temp 97.6°F | Resp 20 | Ht 64.0 in | Wt 200.0 lb

## 2019-10-14 DIAGNOSIS — I6502 Occlusion and stenosis of left vertebral artery: Secondary | ICD-10-CM

## 2019-10-14 DIAGNOSIS — I70245 Atherosclerosis of native arteries of left leg with ulceration of other part of foot: Secondary | ICD-10-CM

## 2019-10-14 DIAGNOSIS — I739 Peripheral vascular disease, unspecified: Secondary | ICD-10-CM | POA: Diagnosis not present

## 2019-10-14 DIAGNOSIS — M86272 Subacute osteomyelitis, left ankle and foot: Secondary | ICD-10-CM

## 2019-10-14 MED ORDER — HYDROCODONE-ACETAMINOPHEN 5-325 MG PO TABS: 1.0000 | ORAL_TABLET | Freq: Two times a day (BID) | ORAL | 0 refills | Status: DC

## 2019-10-14 MED ORDER — HYDROCODONE-ACETAMINOPHEN 5-325 MG PO TABS: 1.0000 | ORAL_TABLET | Freq: Four times a day (QID) | ORAL | 0 refills | Status: DC | PRN

## 2019-10-14 MED ORDER — DOXYCYCLINE HYCLATE 100 MG PO TABS: 100.0000 mg | ORAL_TABLET | Freq: Two times a day (BID) | ORAL | 0 refills | Status: DC

## 2019-10-14 NOTE — H&P (View-Only) (Signed)
[facility]  Patient name: Taylor [name] MRN: [id number] DOB: Dec 31, 1980 Sex: female   REASON FOR VISIT:    Follow up  HISOTRY OF PRESENT ILLNESS:    Taylor [name] is a 39 y.o. female who was transferred to [facility] from [facility] for vascular evaluation.  She initially had a left foot inspection with cellulitis and abscess complicated by fifth toe osteomyelitis.  While at [facility] she underwent incision and drainage of this area.  ABIs were performed which showed moderate arterial disease in the left and so she was sent to [facility].  On 06/25/2019 she went for angiography.  She was found to have a 90% mid superficial femoral artery lesion and an occluded short segment popliteal artery.  These were both successfully crossed and treated with jetstream atherectomy and drug-coated balloon angioplasty using a 4 mm balloon.  She was also found to have a 90% profunda origin stenosis which was not treated.  On 07/03/2019 Dr. Sharol Given performed a left fourth and fifth ray amputation along with placement of ACell  The patient has history of acute renal failure, but creatinine is normal on admission. She is a diabetic which is poorly controlled. She does have a history of stroke, status post vertebral artery stenting. She is a former smoker. The patient is now on a statin for hypercholesterolemia. She is bipolar.  Her wound has yet to heal  PAST MEDICAL HISTORY:   Past Medical History:  Diagnosis Date  . Anxiety   . ARF (acute renal failure) ([place]) 2013  . Bipolar disorder ([place])   . Cerebral artery occlusion with cerebral infarction ([place]) 11/13   due to elevated blood sugar 1700-off insulin 6 months  . Depression   . Diabetes mellitus    off insulin 6 months  . Diverticulitis   . Dyspnea    when walking  . GERD (gastroesophageal reflux disease)   . Headache(784.0)   . Hypertension   . Kidney dialysis status    on  dialysis(stroke) off after 4-6 weeks  . Pneumonia 2013, 06/2018  . Respiratory failure ([place]) 11/13   with stroke - in hosp [facility] 12 weeks  . Seizures ([place])    during elvated blood sugsr episode 11/13  . Sepsis ([place])   . Stroke ([place])    11/2018, 12/2018 Weak left side, speech- slurred, Short term memeroy, Gait unsteady   . Vocal cord dysfunction      FAMILY HISTORY:   Family History  Problem Relation Age of Onset  . Lung cancer Maternal Grandmother   . Heart attack Maternal Grandfather   . Diabetes Paternal Grandfather     SOCIAL HISTORY:   Social History   Tobacco Use  . Smoking status: Former Smoker    Packs/day: 0.60    Years: 23.00    Pack years: 13.80    Types: Cigarettes    Quit date: 02/05/2019    Years since quitting: 0.6  . Smokeless tobacco: Never Used  Substance Use Topics  . Alcohol use: No    Alcohol/week: 0.0 standard drinks     ALLERGIES:   Allergies  Allergen Reactions  . Contrast Media [Iodinated Diagnostic Agents] Other (See Comments)    Went into kidney failure   . Amitriptyline Other (See Comments)    Unresponsive   . Compazine Other (See Comments)    Alert but unable to move  . Morphine And Related Other (See Comments)    Chest pain  . Oxycodone Nausea  And Vomiting    Severe n/v -  patient will not take oxycodone or percocet.  . Prochlorperazine Other (See Comments)    Alert but unable to move  . Promethazine Swelling    Lips swell  . Sumatriptan Rash     CURRENT MEDICATIONS:   Current Outpatient Medications  Medication Sig Dispense Refill  . acetaminophen (TYLENOL) 500 MG tablet Take 500 mg by mouth every 6 (six) hours as needed for headache (pain). pain     . aspirin 81 MG EC tablet Take 81 mg by mouth daily.     Marland Kitchen atorvastatin (LIPITOR) 40 MG tablet Take 40 mg by mouth daily.    . blood glucose meter kit and supplies Dispense based on patient and insurance preference. Use up to four times daily as directed. (FOR ICD-10 E10.9,  E11.9). 1 each 0  . clopidogrel (PLAVIX) 75 MG tablet TAKE 1 TABLET BY MOUTH DAILY (Patient taking differently: Take 75 mg by mouth daily. ) 30 tablet 5  . dapagliflozin propanediol (FARXIGA) 10 MG TABS tablet Take 10 mg by mouth daily.     . diazepam (VALIUM) 5 MG tablet Take 5 mg by mouth 2 (two) times daily.     . diphenhydrAMINE (BENADRYL) 25 MG tablet Take 50 mg by mouth See admin instructions. Take 2 tablets (50 mg) by mouth one hour prior to procedure involving contrast dye    . docusate sodium (COLACE) 100 MG capsule Take 1 capsule (100 mg total) by mouth 2 (two) times daily. 10 capsule 0  . DULoxetine (CYMBALTA) 30 MG capsule Take 30 mg by mouth daily.    Marland Kitchen gabapentin (NEURONTIN) 300 MG capsule Take 3 capsules (900 mg total) by mouth 3 (three) times daily. 410 capsule 1  . HYDROcodone-acetaminophen (NORCO/VICODIN) 5-325 MG tablet TAKE 1 PO Q 8 HOURS AS NEEDED FOR PAIN 30 tablet 0  . HYDROcodone-acetaminophen (NORCO/VICODIN) 5-325 MG tablet Take 1 tablet by mouth 2 (two) times daily. 20 tablet 0  . ibuprofen (ADVIL) 400 MG tablet Take 1 tablet (400 mg total) by mouth every 8 (eight) hours as needed for moderate pain. 30 tablet 0  . labetalol (NORMODYNE) 300 MG tablet Take 300 mg by mouth 2 (two) times daily.    Marland Kitchen levETIRAcetam (KEPPRA XR) 500 MG 24 hr tablet Take 500 mg by mouth 2 (two) times daily.     Marland Kitchen lisinopril (ZESTRIL) 40 MG tablet Take 40 mg by mouth daily.     . metFORMIN (GLUCOPHAGE) 500 MG tablet Take 500 mg by mouth 2 (two) times daily.    . nitroGLYCERIN (NITRODUR - DOSED IN MG/24 HR) 0.2 mg/hr patch Place 1 patch (0.2 mg total) onto the skin daily. Place patch on foot in different spot daily 30 patch 12  . omeprazole (PRILOSEC) 20 MG capsule Take 20 mg by mouth daily.    . risperiDONE (RISPERDAL) 0.5 MG tablet Take 0.5 mg by mouth at bedtime.    . silver sulfADIAZINE (SILVADENE) 1 % cream Apply 1 application topically daily. 400 g 0  . traZODone (DESYREL) 100 MG tablet Take  200 mg by mouth at bedtime.      No current facility-administered medications for this visit.    REVIEW OF SYSTEMS:   _0  denotes positive finding, _1  denotes negative finding Cardiac  Comments:  Chest pain or chest pressure:    Shortness of breath upon exertion:    Short of breath when lying flat:    Irregular heart rhythm:  Vascular    Pain in calf, thigh, or hip brought on by ambulation:    Pain in feet at night that wakes you up from your sleep:     Blood clot in your veins:    Leg swelling:         Pulmonary    Oxygen at home:    Productive cough:     Wheezing:         Neurologic    Sudden weakness in arms or legs:     Sudden numbness in arms or legs:     Sudden onset of difficulty speaking or slurred speech:    Temporary loss of vision in one eye:     Problems with dizziness:         Gastrointestinal    Blood in stool:     Vomited blood:         Genitourinary    Burning when urinating:     Blood in urine:        Psychiatric    Major depression:         Hematologic    Bleeding problems:    Problems with blood clotting too easily:        Skin    Rashes or ulcers:        Constitutional    Fever or chills:      PHYSICAL EXAM:   There were no vitals filed for this visit.  GENERAL: The patient is a well-nourished female, in no acute distress. The vital signs are documented above. CARDIAC: There is a regular rate and rhythm.  VASCULAR: I cannot palpate pedal pulses PULMONARY: Non-labored respirations MUSCULOSKELETAL: There are no major deformities or cyanosis. NEUROLOGIC: No focal weakness or paresthesias are detected. SKIN: Open wound on the dorsum of the left foot with healed fourth and fifth toe amputation sites. PSYCHIATRIC: The patient has a normal affect.  STUDIES:   I have ordered and reviewed the following studies: +-------+-----------+-----------+------------+------------+  [name]/TBIToday's ABIToday's TBIPrevious ABIPrevious  TBI  +-------+-----------+-----------+------------+------------+  Right 0.51    0.43    0.67    0.47      +-------+-----------+-----------+------------+------------+  Left  0.90    0.71    1.03    0.75      +-------+-----------+-----------+------------+------------+   Elevated velocities within the left leg, some greater than 300 MEDICAL ISSUES:   The patient has a wound that is yet to heal on the left leg.  Ultrasound study today suggest a decline in perfusion to the left foot and so I think angiography is warranted.  This will be from a right groin approach.  She will be premedicated for her dye allergy with plans for procedure on Tuesday, May 11 risks and benefits of the procedure discussed with the patient and her mom who was on speaker phone.    Leia Alf, MD, FACS [facility] [phone] Pager [phone]

## 2019-10-14 NOTE — Progress Notes (Signed)
Office Visit Note   Patient: Taylor [name]           Date of Birth: 04-03-1981           MRN: [id number] Visit Date: 10/14/2019              Requested by: Monico Blitz, MD [address] [place],  [place] [address] PCP: Monico Blitz, MD  Chief Complaint  Patient presents with  . Left Foot - Pain, Routine Post Op, Follow-up      HPI: The patient presents today 3 months status post left foot fourth and fifth ray amputation with skin grafting she is over all been doing well but has noticed increased redness in the last few days.  She denies any fever or chills.  She has been doing daily dressing changes.  She is scheduled to have a vascular procedure next week on her lower extremities  Assessment & Plan: Visit Diagnoses:  1. Subacute osteomyelitis of left foot ([place])     Plan: I will put her on a short course of doxycycline.  I asked mother to make sure she is taking a probiotic.  I have refilled her pain medication but I did discuss with her and her mother that she will have to consider chronic pain management Follow-Up Instructions: No follow-ups on file.   Ortho Exam  Patient is alert, oriented, no adenopathy, well-dressed, normal affect, normal respiratory effort. Left foot.  Graft is healing well with good healthy ingrowth she does have some surrounding erythema but no foul odor no purulent drainage she has some tenderness to palpation over the lateral foot  Imaging: VAS [place] [name] WITH/WO TBI  Result Date: 10/14/2019 LOWER EXTREMITY DOPPLER STUDY Indications: Peripheral artery disease, and left foot pain. High Risk Factors: Hypertension, hyperlipidemia.  Vascular Interventions: Left drug coated balloon angioplasty left SFA and                         popliteal artery 06/25/2019. Performing Technologist: Ronal Fear RVS, RCS  Examination Guidelines: A complete evaluation includes at minimum, Doppler waveform signals and systolic blood pressure reading at the level of bilateral brachial,  anterior tibial, and posterior tibial arteries, when vessel segments are accessible. Bilateral testing is considered an integral part of a complete examination. Photoelectric Plethysmograph (PPG) waveforms and toe systolic pressure readings are included as required and additional duplex testing as needed. Limited examinations for reoccurring indications may be performed as noted.  [name] Findings: +---------+------------------+-----+----------+--------+ Right    Rt Pressure (mmHg)IndexWaveform  Comment  +---------+------------------+-----+----------+--------+ Brachial 146                                       +---------+------------------+-----+----------+--------+ PTA      74                0.51 monophasic         +---------+------------------+-----+----------+--------+ DP       66                0.45 monophasic         +---------+------------------+-----+----------+--------+ Great Toe63                0.43                    +---------+------------------+-----+----------+--------+ +---------+------------------+-----+--------+-------+ Left     Lt Pressure (mmHg)IndexWaveformComment +---------+------------------+-----+--------+-------+ Brachial  bandage +---------+------------------+-----+--------+-------+ PTA      122               0.84 biphasic        +---------+------------------+-----+--------+-------+ DP       132               0.90 biphasic        +---------+------------------+-----+--------+-------+ Great Toe103               0.71                 +---------+------------------+-----+--------+-------+ +-------+-----------+-----------+------------+------------+ [name]/TBIToday's ABIToday's TBIPrevious ABIPrevious TBI +-------+-----------+-----------+------------+------------+ Right  0.51       0.43       0.67        0.47         +-------+-----------+-----------+------------+------------+ Left   0.90       0.71        1.03        0.75         +-------+-----------+-----------+------------+------------+ Bilateral ABIs appear decreased compared to prior study on 07/22/2019.  Summary: Right: Resting right ankle-brachial index indicates moderate right lower extremity arterial disease. The right toe-brachial index is abnormal. Left: Resting left ankle-brachial index indicates mild left lower extremity arterial disease. The left toe-brachial index is normal.  *See table(s) above for measurements and observations.  Electronically signed by Harold Barban MD on 10/14/2019 at 2:45:27 PM.    Final    VAS [place] LOWER EXTREMITY ARTERIAL DUPLEX  Result Date: 10/14/2019 LOWER EXTREMITY ARTERIAL DUPLEX STUDY Indications: Gangrene, peripheral artery disease, and left foot pain. High Risk Factors: Hypertension, hyperlipidemia.  Vascular Interventions: Left drug coated balloon angioplasty left SFA and                         popliteal artery 06/25/2019. Current [name]:            R=0.51, L=0.90 Performing Technologist: Ronal Fear RVS, RCS  Examination Guidelines: A complete evaluation includes B-mode imaging, spectral Doppler, color Doppler, and power Doppler as needed of all accessible portions of each vessel. Bilateral testing is considered an integral part of a complete examination. Limited examinations for reoccurring indications may be performed as noted.  +----------+--------+-----+---------------+--------+--------+ LEFT      PSV cm/sRatioStenosis       WaveformComments +----------+--------+-----+---------------+--------+--------+ CFA Distal104                                          +----------+--------+-----+---------------+--------+--------+ DFA       169                                          +----------+--------+-----+---------------+--------+--------+ SFA Prox  310          50-74% stenosis                 +----------+--------+-----+---------------+--------+--------+ SFA Mid   259          50-74%  stenosis                 +----------+--------+-----+---------------+--------+--------+ SFA Distal207          50-74% stenosis                 +----------+--------+-----+---------------+--------+--------+ POP Prox  72                                           +----------+--------+-----+---------------+--------+--------+  POP Distal59                                           +----------+--------+-----+---------------+--------+--------+ ATA Distal55                                           +----------+--------+-----+---------------+--------+--------+ PTA Distal66                                           +----------+--------+-----+---------------+--------+--------+ A focal velocity elevation of 310 cm/s was obtained at proximal SFA with post stenotic turbulence with a VR of 2.4. Findings are characteristic of 50-74% stenosis. A 2nd focal velocity elevation was visualized, measuring 259 cm/s at mid SFA with post stenotic turbulence with a VR of 1.95. Findings are characteristic of 50-74% stenosis. A 3rd focal velocity elevation was visualized, measuring at distal SFA. Findings are characteristic of 50-74% stenosis.  Summary: Left: Serial stenoses observed in the superficial femoral artery; proximal, mid and distal suggesting 50-74% stenosis in each segment.  See table(s) above for measurements and observations. Electronically signed by Harold Barban MD on 10/14/2019 at 2:44:10 PM.    Final    No images are attached to the encounter.  Labs: Lab Results  Component Value Date   HGBA1C 10.0 (H) 06/23/2019   ESRSEDRATE 36 (H) 07/31/2019   ESRSEDRATE 96 (H) 06/23/2019   CRP 6.5 07/31/2019   CRP 7.9 (H) 06/23/2019   REPTSTATUS 06/30/2019 FINAL 06/23/2019   CULT NO GROWTH 7 DAYS 06/23/2019     Lab Results  Component Value Date   ALBUMIN 2.9 (L) 07/01/2019   ALBUMIN 2.2 (L) 06/26/2019   ALBUMIN 3.6 03/02/2012   PREALBUMIN 17.3 (L) 06/23/2019    Lab Results  Component  Value Date   MG 2.2 06/25/2019   MG 1.4 (L) 06/24/2019   No results found for: VD25OH  Lab Results  Component Value Date   PREALBUMIN 17.3 (L) 06/23/2019   CBC EXTENDED Latest Ref Rng & Units 07/31/2019 07/05/2019 07/04/2019  WBC 3.8 - 10.8 Thousand/uL 5.2 - -  RBC 3.80 - 5.10 Million/uL 4.58 - -  HGB 11.7 - 15.5 g/dL 9.6(L) 8.2(L) 7.8(L)  HCT 35.0 - 45.0 % 34.1(L) 29.8(L) 27.8(L)  PLT 140 - 400 Thousand/uL 265 - -  NEUTROABS 1.7 - 7.7 K/uL - - -  LYMPHSABS 0.7 - 4.0 K/uL - - -     There is no height or weight on file to calculate BMI.  Orders:  Orders Placed This Encounter  Procedures  . XR Foot Complete Left   Meds ordered this encounter  Medications  . doxycycline (VIBRA-TABS) 100 MG tablet    Sig: Take 1 tablet (100 mg total) by mouth 2 (two) times daily.    Dispense:  30 tablet    Refill:  0  . HYDROcodone-acetaminophen (NORCO/VICODIN) 5-325 MG tablet    Sig: Take 1 tablet by mouth 2 (two) times daily.    Dispense:  20 tablet    Refill:  0     Procedures: No procedures performed  Clinical Data: No additional findings.  ROS:  All other systems negative, except as noted in the HPI. Review of Systems  Objective:  Vital Signs: There were no vitals taken for this visit.  Specialty Comments:  No specialty comments available.  PMFS History: Patient Active Problem List   Diagnosis Date Noted  . Obesity, Class III, BMI 40-49.9 (morbid obesity) ([place]) 07/05/2019  . Diabetic foot infection ([place]) 06/23/2019  . PAD (peripheral artery disease) ([place])   . Osteomyelitis of left foot ([place])   . Vertebral artery stenosis, left 03/25/2019  . VBI (vertebrobasilar insufficiency) 02/20/2019  . Intracranial carotid stenosis, left 01/23/2019  . 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 Tobacco use disorder 01/02/2015  . Cognitive impairment 01/02/2015  . Stroke ([place]) 09/07/2012  . Pain in upper limb 09/07/2012  . Pain in  lower limb 09/07/2012  . Bipolar affective disorder ([place]) 09/07/2012  . Depressive disorder 09/07/2012  . Type II diabetes mellitus ([place]) 09/07/2012  . Memory loss 09/07/2012  . 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 Personal history of noncompliance with medical treatment, presenting hazards to health 05/25/2012  . Cerebral artery occlusion with cerebral infarction ([place]) 05/10/2012   Past Medical History:  Diagnosis Date  . Anxiety   . ARF (acute renal failure) ([place]) 2013  . Bipolar disorder ([place])   . Cerebral artery occlusion with cerebral infarction ([place]) 11/13   due to elevated blood sugar 1700-off insulin 6 months  . Depression   . Diabetes mellitus    off insulin 6 months  . Diverticulitis   . Dyspnea    when walking  . GERD (gastroesophageal reflux disease)   . Headache(784.0)   . Hypertension   . Kidney dialysis status    on dialysis(stroke) off after 4-6 weeks  . Pneumonia 2013, 06/2018  . Respiratory failure ([place]) 11/13   with stroke - in hosp [facility] 12 weeks  . Seizures ([place])    during elvated blood sugsr episode 11/13  . Sepsis ([place])   . Stroke ([place])    11/2018, 12/2018 Weak left side, speech- slurred, Short term memeroy, Gait unsteady   . Vocal cord dysfunction     Family History  Problem Relation Age of Onset  . Lung cancer Maternal Grandmother   . Heart attack Maternal Grandfather   . Diabetes Paternal Grandfather     Past Surgical History:  Procedure Laterality Date  . ABDOMINAL AORTOGRAM W/LOWER EXTREMITY N/A 06/25/2019   Procedure: ABDOMINAL AORTOGRAM W/LOWER EXTREMITY;  Surgeon: Serafina Mitchell, MD;  Location: [facility];  Service: Cardiovascular;  Laterality: N/A;  . AMPUTATION Left 07/03/2019   Procedure: LEFT FOOT 4TH AND 5TH RAY AMPUTATION;  Surgeon: Newt Minion, MD;  Location: [facility];  Service: Orthopedics;  Laterality: Left;  . APPENDECTOMY    . INSERTION OF DIALYSIS CATHETER  11/13   removed in 4-6 weeks  . IR ANGIO INTRA EXTRACRAN SEL  COM CAROTID INNOMINATE BILAT MOD SED  02/07/2019  . IR ANGIO VERTEBRAL SEL VERTEBRAL BILAT MOD SED  02/07/2019  . IR ANGIOGRAM EXTREMITY LEFT  02/07/2019  . IR ANGIOGRAM EXTREMITY RIGHT  02/20/2019  . IR TRANSCATH EXCRAN VERT OR CAR A STENT  02/20/2019  . IR TRANSCATH EXCRAN VERT OR CAR A STENT  03/25/2019  . IR US GUIDE VASC ACCESS RIGHT  02/07/2019  . MULTIPLE EXTRACTIONS WITH ALVEOLOPLASTY N/A 02/24/2014   Procedure: MULTIPLE EXTRACTION WITH ALVEOLOPLASTY AND BIOPSY;  Surgeon: Gae Bon, DDS;  Location: [facility];  Service: Oral Surgery;  Laterality: N/A;  . PERIPHERAL VASCULAR ATHERECTOMY Left 06/25/2019   Procedure: PERIPHERAL VASCULAR ATHERECTOMY;  Surgeon: Serafina Mitchell, MD;  Location: [facility];  Service: Cardiovascular;  Laterality: Left;  superficial femoral  . RADIOLOGY WITH ANESTHESIA N/A 02/20/2019   Procedure: RADIOLOGY WITH ANESTHESIA   STENTING;  Surgeon: Luanne Bras, MD;  Location: [facility];  Service: Radiology;  Laterality: N/A;  . RADIOLOGY WITH ANESTHESIA N/A 03/25/2019   Procedure: RADIOLOGY WITH ANESTHESIA STENTING;  Surgeon: Luanne Bras, MD;  Location: [facility];  Service: Radiology;  Laterality: N/A;  . SKIN SPLIT GRAFT Left 07/03/2019   Procedure: APPLY SKIN GRAFT LEFT FOOT;  Surgeon: Newt Minion, MD;  Location: [facility];  Service: Orthopedics;  Laterality: Left;  . TRACHEOSTOMY  11/13   closed 1/14  . TRANSRECTAL DRAINAGE OF PELVIC ABSCESS  12/11   Social History   Occupational History  . Not on file  Tobacco Use  . Smoking status: Former Smoker    Packs/day: 0.60    Years: 23.00    Pack years: 13.80    Types: Cigarettes    Quit date: 02/05/2019    Years since quitting: 0.6  . Smokeless tobacco: Never Used  Substance and Sexual Activity  . Alcohol use: No    Alcohol/week: 0.0 standard drinks  . Drug use: No  . Sexual activity: Not Currently    Birth control/protection: None

## 2019-10-14 NOTE — Progress Notes (Signed)
[facility]  Patient name: Taylor [name] MRN: [id number] DOB: [birth year] Sex: female   REASON FOR VISIT:    Follow up  HISOTRY OF PRESENT ILLNESS:    Taylor [name] is a 39 y.o. female who was transferred to [facility] from [facility] for vascular evaluation.  She initially had a left foot inspection with cellulitis and abscess complicated by fifth toe osteomyelitis.  While at [facility] she underwent incision and drainage of this area.  ABIs were performed which showed moderate arterial disease in the left and so she was sent to [facility].  On 06/25/2019 she went for angiography.  She was found to have a 90% mid superficial femoral artery lesion and an occluded short segment popliteal artery.  These were both successfully crossed and treated with jetstream atherectomy and drug-coated balloon angioplasty using a 4 mm balloon.  She was also found to have a 90% profunda origin stenosis which was not treated.  On 07/03/2019 Dr. Sharol Given performed a left fourth and fifth ray amputation along with placement of ACell  The patient has history of acute renal failure, but creatinine is normal on admission. She is a diabetic which is poorly controlled. She does have a history of stroke, status post vertebral artery stenting. She is a former smoker. The patient is now on a statin for hypercholesterolemia. She is bipolar.  Her wound has yet to heal  PAST MEDICAL HISTORY:   Past Medical History:  Diagnosis Date  . Anxiety   . ARF (acute renal failure) ([place]) 2013  . Bipolar disorder ([place])   . Cerebral artery occlusion with cerebral infarction ([place]) 11/13   due to elevated blood sugar 1700-off insulin 6 months  . Depression   . Diabetes mellitus    off insulin 6 months  . Diverticulitis   . Dyspnea    when walking  . GERD (gastroesophageal reflux disease)   . Headache(784.0)   . Hypertension   . Kidney dialysis status    on  dialysis(stroke) off after 4-6 weeks  . Pneumonia 2013, 06/2018  . Respiratory failure ([place]) 11/13   with stroke - in hosp [facility] 12 weeks  . Seizures ([place])    during elvated blood sugsr episode 11/13  . Sepsis ([place])   . Stroke ([place])    11/2018, 12/2018 Weak left side, speech- slurred, Short term memeroy, Gait unsteady   . Vocal cord dysfunction      FAMILY HISTORY:   Family History  Problem Relation Age of Onset  . Lung cancer Maternal Grandmother   . Heart attack Maternal Grandfather   . Diabetes Paternal Grandfather     SOCIAL HISTORY:   Social History   Tobacco Use  . Smoking status: Former Smoker    Packs/day: 0.60    Years: 23.00    Pack years: 13.80    Types: Cigarettes    Quit date: 02/05/2019    Years since quitting: 0.6  . Smokeless tobacco: Never Used  Substance Use Topics  . Alcohol use: No    Alcohol/week: 0.0 standard drinks     ALLERGIES:   Allergies  Allergen Reactions  . Contrast Media [Iodinated Diagnostic Agents] Other (See Comments)    Went into kidney failure   . Amitriptyline Other (See Comments)    Unresponsive   . Compazine Other (See Comments)    Alert but unable to move  . Morphine And Related Other (See Comments)    Chest pain  . Oxycodone Nausea  And Vomiting    Severe n/v -  patient will not take oxycodone or percocet.  . Prochlorperazine Other (See Comments)    Alert but unable to move  . Promethazine Swelling    Lips swell  . Sumatriptan Rash     CURRENT MEDICATIONS:   Current Outpatient Medications  Medication Sig Dispense Refill  . acetaminophen (TYLENOL) 500 MG tablet Take 500 mg by mouth every 6 (six) hours as needed for headache (pain). pain     . aspirin 81 MG EC tablet Take 81 mg by mouth daily.     Marland Kitchen atorvastatin (LIPITOR) 40 MG tablet Take 40 mg by mouth daily.    . blood glucose meter kit and supplies Dispense based on patient and insurance preference. Use up to four times daily as directed. (FOR ICD-10 E10.9,  E11.9). 1 each 0  . clopidogrel (PLAVIX) 75 MG tablet TAKE 1 TABLET BY MOUTH DAILY (Patient taking differently: Take 75 mg by mouth daily. ) 30 tablet 5  . dapagliflozin propanediol (FARXIGA) 10 MG TABS tablet Take 10 mg by mouth daily.     . diazepam (VALIUM) 5 MG tablet Take 5 mg by mouth 2 (two) times daily.     . diphenhydrAMINE (BENADRYL) 25 MG tablet Take 50 mg by mouth See admin instructions. Take 2 tablets (50 mg) by mouth one hour prior to procedure involving contrast dye    . docusate sodium (COLACE) 100 MG capsule Take 1 capsule (100 mg total) by mouth 2 (two) times daily. 10 capsule 0  . DULoxetine (CYMBALTA) 30 MG capsule Take 30 mg by mouth daily.    Marland Kitchen gabapentin (NEURONTIN) 300 MG capsule Take 3 capsules (900 mg total) by mouth 3 (three) times daily. 410 capsule 1  . HYDROcodone-acetaminophen (NORCO/VICODIN) 5-325 MG tablet TAKE 1 PO Q 8 HOURS AS NEEDED FOR PAIN 30 tablet 0  . HYDROcodone-acetaminophen (NORCO/VICODIN) 5-325 MG tablet Take 1 tablet by mouth 2 (two) times daily. 20 tablet 0  . ibuprofen (ADVIL) 400 MG tablet Take 1 tablet (400 mg total) by mouth every 8 (eight) hours as needed for moderate pain. 30 tablet 0  . labetalol (NORMODYNE) 300 MG tablet Take 300 mg by mouth 2 (two) times daily.    Marland Kitchen levETIRAcetam (KEPPRA XR) 500 MG 24 hr tablet Take 500 mg by mouth 2 (two) times daily.     Marland Kitchen lisinopril (ZESTRIL) 40 MG tablet Take 40 mg by mouth daily.     . metFORMIN (GLUCOPHAGE) 500 MG tablet Take 500 mg by mouth 2 (two) times daily.    . nitroGLYCERIN (NITRODUR - DOSED IN MG/24 HR) 0.2 mg/hr patch Place 1 patch (0.2 mg total) onto the skin daily. Place patch on foot in different spot daily 30 patch 12  . omeprazole (PRILOSEC) 20 MG capsule Take 20 mg by mouth daily.    . risperiDONE (RISPERDAL) 0.5 MG tablet Take 0.5 mg by mouth at bedtime.    . silver sulfADIAZINE (SILVADENE) 1 % cream Apply 1 application topically daily. 400 g 0  . traZODone (DESYREL) 100 MG tablet Take  200 mg by mouth at bedtime.      No current facility-administered medications for this visit.    REVIEW OF SYSTEMS:   _0  denotes positive finding, _1  denotes negative finding Cardiac  Comments:  Chest pain or chest pressure:    Shortness of breath upon exertion:    Short of breath when lying flat:    Irregular heart rhythm:  Vascular    Pain in calf, thigh, or hip brought on by ambulation:    Pain in feet at night that wakes you up from your sleep:     Blood clot in your veins:    Leg swelling:         Pulmonary    Oxygen at home:    Productive cough:     Wheezing:         Neurologic    Sudden weakness in arms or legs:     Sudden numbness in arms or legs:     Sudden onset of difficulty speaking or slurred speech:    Temporary loss of vision in one eye:     Problems with dizziness:         Gastrointestinal    Blood in stool:     Vomited blood:         Genitourinary    Burning when urinating:     Blood in urine:        Psychiatric    Major depression:         Hematologic    Bleeding problems:    Problems with blood clotting too easily:        Skin    Rashes or ulcers:        Constitutional    Fever or chills:      PHYSICAL EXAM:   There were no vitals filed for this visit.  GENERAL: The patient is a well-nourished female, in no acute distress. The vital signs are documented above. CARDIAC: There is a regular rate and rhythm.  VASCULAR: I cannot palpate pedal pulses PULMONARY: Non-labored respirations MUSCULOSKELETAL: There are no major deformities or cyanosis. NEUROLOGIC: No focal weakness or paresthesias are detected. SKIN: Open wound on the dorsum of the left foot with healed fourth and fifth toe amputation sites. PSYCHIATRIC: The patient has a normal affect.  STUDIES:   I have ordered and reviewed the following studies: +-------+-----------+-----------+------------+------------+  [name]/TBIToday's ABIToday's TBIPrevious ABIPrevious  TBI  +-------+-----------+-----------+------------+------------+  Right 0.51    0.43    0.67    0.47      +-------+-----------+-----------+------------+------------+  Left  0.90    0.71    1.03    0.75      +-------+-----------+-----------+------------+------------+   Elevated velocities within the left leg, some greater than 300 MEDICAL ISSUES:   The patient has a wound that is yet to heal on the left leg.  Ultrasound study today suggest a decline in perfusion to the left foot and so I think angiography is warranted.  This will be from a right groin approach.  She will be premedicated for her dye allergy with plans for procedure on Tuesday, May 11 risks and benefits of the procedure discussed with the patient and her mom who was on speaker phone.    Leia Alf, MD, FACS [facility] [phone] Pager [phone]

## 2019-10-14 NOTE — Telephone Encounter (Signed)
NIR.  Received request from patient's mother regarding pre-medications for CTA. Patient with known history of iodine allergy with reaction of "went into kidney failure".  Called patient's mother, Lattie Haw [phone]) at [phone] to discuss. Informed Lattie Haw that this reaction is a side effect of using iodine dye, not a true allergy. Explained that treatment for decreased renal function secondary to iodine dye use would be hydration. Explained that pre-medications are not required for CTA, as again this is not a true allergy. Lattie Haw states that she has needed pre-medications for 8 years, and would feel more comfortable if she got them for her daughter's CTA.   Faxed filled prescriptions to [facility] [phone]) at 1314: 1- Prednisone 50 mg tablets; take one tablet by mouth 13 hours, 7 hours, and 1 hour prior to procedure on 11/04/2019; dispense 3 tablets with 0 refills. 2- Benadryl 50 mg tablets; take one tablet by mouth 1 hour prior to procedure on 11/04/2019; dispense 1 tablet with 0 refills.   Bea Graff Revin Corker, PA-C 10/14/2019, 1:28 PM

## 2019-10-15 ENCOUNTER — Ambulatory Visit: Payer: Medicaid Other | Admitting: Internal Medicine

## 2019-10-18 ENCOUNTER — Other Ambulatory Visit: Payer: Self-pay

## 2019-10-18 ENCOUNTER — Other Ambulatory Visit (HOSPITAL_COMMUNITY)
Admission: RE | Admit: 2019-10-18 | Discharge: 2019-10-18 | Disposition: A | Discharge status: Home / Self Care | Payer: Medicaid Other | Source: Ambulatory Visit | Attending: Surgery | Admitting: Surgery

## 2019-10-18 DIAGNOSIS — Z20822 Contact with and (suspected) exposure to covid-19: Secondary | ICD-10-CM | POA: Diagnosis not present

## 2019-10-18 DIAGNOSIS — Z01812 Encounter for preprocedural laboratory examination: Secondary | ICD-10-CM | POA: Diagnosis present

## 2019-10-19 LAB — SARS CORONAVIRUS 2 (TAT 6-24 HRS): SARS Coronavirus 2: NEGATIVE

## 2019-10-21 ENCOUNTER — Other Ambulatory Visit (HOSPITAL_COMMUNITY): Payer: Medicaid Other

## 2019-10-22 ENCOUNTER — Ambulatory Visit (HOSPITAL_COMMUNITY)
Admission: RE | Admit: 2019-10-22 | Discharge: 2019-10-22 | Disposition: A | Discharge status: Home / Self Care | Payer: Medicaid Other | Attending: Surgery | Admitting: Surgery

## 2019-10-22 ENCOUNTER — Other Ambulatory Visit: Payer: Self-pay

## 2019-10-22 ENCOUNTER — Encounter (HOSPITAL_COMMUNITY)
Admission: RE | Disposition: A | Discharge status: Home / Self Care | Payer: Self-pay | Source: Home / Self Care | Attending: Surgery

## 2019-10-22 DIAGNOSIS — Z8673 Personal history of transient ischemic attack (TIA), and cerebral infarction without residual deficits: Secondary | ICD-10-CM | POA: Diagnosis not present

## 2019-10-22 DIAGNOSIS — Z87891 Personal history of nicotine dependence: Secondary | ICD-10-CM | POA: Insufficient documentation

## 2019-10-22 DIAGNOSIS — E1122 Type 2 diabetes mellitus with diabetic chronic kidney disease: Secondary | ICD-10-CM | POA: Insufficient documentation

## 2019-10-22 DIAGNOSIS — F319 Bipolar disorder, unspecified: Secondary | ICD-10-CM | POA: Insufficient documentation

## 2019-10-22 DIAGNOSIS — Z992 Dependence on renal dialysis: Secondary | ICD-10-CM | POA: Diagnosis not present

## 2019-10-22 DIAGNOSIS — Z79899 Other long term (current) drug therapy: Secondary | ICD-10-CM | POA: Diagnosis not present

## 2019-10-22 DIAGNOSIS — Z885 Allergy status to narcotic agent status: Secondary | ICD-10-CM | POA: Diagnosis not present

## 2019-10-22 DIAGNOSIS — N189 Chronic kidney disease, unspecified: Secondary | ICD-10-CM | POA: Diagnosis not present

## 2019-10-22 DIAGNOSIS — Z8679 Personal history of other diseases of the circulatory system: Secondary | ICD-10-CM | POA: Insufficient documentation

## 2019-10-22 DIAGNOSIS — Z8249 Family history of ischemic heart disease and other diseases of the circulatory system: Secondary | ICD-10-CM | POA: Insufficient documentation

## 2019-10-22 DIAGNOSIS — L97529 Non-pressure chronic ulcer of other part of left foot with unspecified severity: Secondary | ICD-10-CM | POA: Insufficient documentation

## 2019-10-22 DIAGNOSIS — Z7984 Long term (current) use of oral hypoglycemic drugs: Secondary | ICD-10-CM | POA: Insufficient documentation

## 2019-10-22 DIAGNOSIS — Z91041 Radiographic dye allergy status: Secondary | ICD-10-CM | POA: Insufficient documentation

## 2019-10-22 DIAGNOSIS — Z833 Family history of diabetes mellitus: Secondary | ICD-10-CM | POA: Diagnosis not present

## 2019-10-22 DIAGNOSIS — F419 Anxiety disorder, unspecified: Secondary | ICD-10-CM | POA: Diagnosis not present

## 2019-10-22 DIAGNOSIS — E1151 Type 2 diabetes mellitus with diabetic peripheral angiopathy without gangrene: Secondary | ICD-10-CM | POA: Insufficient documentation

## 2019-10-22 DIAGNOSIS — Z888 Allergy status to other drugs, medicaments and biological substances status: Secondary | ICD-10-CM | POA: Diagnosis not present

## 2019-10-22 DIAGNOSIS — E11621 Type 2 diabetes mellitus with foot ulcer: Secondary | ICD-10-CM | POA: Insufficient documentation

## 2019-10-22 DIAGNOSIS — N179 Acute kidney failure, unspecified: Secondary | ICD-10-CM | POA: Diagnosis not present

## 2019-10-22 DIAGNOSIS — I70245 Atherosclerosis of native arteries of left leg with ulceration of other part of foot: Secondary | ICD-10-CM | POA: Insufficient documentation

## 2019-10-22 DIAGNOSIS — K219 Gastro-esophageal reflux disease without esophagitis: Secondary | ICD-10-CM | POA: Insufficient documentation

## 2019-10-22 DIAGNOSIS — Z7982 Long term (current) use of aspirin: Secondary | ICD-10-CM | POA: Insufficient documentation

## 2019-10-22 DIAGNOSIS — Z7902 Long term (current) use of antithrombotics/antiplatelets: Secondary | ICD-10-CM | POA: Insufficient documentation

## 2019-10-22 DIAGNOSIS — I70244 Atherosclerosis of native arteries of left leg with ulceration of heel and midfoot: Secondary | ICD-10-CM

## 2019-10-22 DIAGNOSIS — I129 Hypertensive chronic kidney disease with stage 1 through stage 4 chronic kidney disease, or unspecified chronic kidney disease: Secondary | ICD-10-CM | POA: Diagnosis not present

## 2019-10-22 HISTORY — PX: ABDOMINAL AORTOGRAM W/LOWER EXTREMITY: CATH118223

## 2019-10-22 HISTORY — PX: PERIPHERAL VASCULAR BALLOON ANGIOPLASTY: CATH118281

## 2019-10-22 LAB — POCT I-STAT, CHEM 8
BUN: 23 mg/dL — ABNORMAL HIGH (ref 6–20)
Calcium, Ion: 1.24 mmol/L (ref 1.15–1.40)
Chloride: 102 mmol/L (ref 98–111)
Creatinine, Ser: 0.7 mg/dL (ref 0.44–1.00)
Glucose, Bld: 264 mg/dL — ABNORMAL HIGH (ref 70–99)
HCT: 34 % — ABNORMAL LOW (ref 36.0–46.0)
Hemoglobin: 11.6 g/dL — ABNORMAL LOW (ref 12.0–15.0)
Potassium: 5 mmol/L (ref 3.5–5.1)
Sodium: 136 mmol/L (ref 135–145)
TCO2: 26 mmol/L (ref 22–32)

## 2019-10-22 LAB — POCT ACTIVATED CLOTTING TIME: Activated Clotting Time: 257 seconds

## 2019-10-22 LAB — PREGNANCY, URINE: Preg Test, Ur: NEGATIVE

## 2019-10-22 SURGERY — ABDOMINAL AORTOGRAM W/LOWER EXTREMITY
Anesthesia: LOCAL

## 2019-10-22 MED ORDER — MIDAZOLAM HCL 2 MG/2ML IJ SOLN
INTRAMUSCULAR | Status: AC
  Filled 2019-10-22: qty 2

## 2019-10-22 MED ORDER — SODIUM CHLORIDE 0.9 % WEIGHT BASED INFUSION: 1.0000 mL/kg/h | INTRAVENOUS | Status: DC

## 2019-10-22 MED ORDER — HYDROCODONE-ACETAMINOPHEN 5-325 MG PO TABS
1.0000 | ORAL_TABLET | Freq: Once | ORAL | Status: AC
  Administered 2019-10-22: 1 via ORAL
  Filled 2019-10-22: qty 1

## 2019-10-22 MED ORDER — HYDRALAZINE HCL 20 MG/ML IJ SOLN: 5.0000 mg | INTRAMUSCULAR | Status: DC | PRN

## 2019-10-22 MED ORDER — SODIUM CHLORIDE 0.9 % IV SOLN: 250.0000 mL | INTRAVENOUS | Status: DC | PRN

## 2019-10-22 MED ORDER — IODIXANOL 320 MG/ML IV SOLN
INTRAVENOUS | Status: DC | PRN
  Administered 2019-10-22: 130 mL via INTRA_ARTERIAL

## 2019-10-22 MED ORDER — METHYLPREDNISOLONE SODIUM SUCC 125 MG IJ SOLR
125.0000 mg | INTRAMUSCULAR | Status: AC
  Administered 2019-10-22: 125 mg via INTRAVENOUS
  Filled 2019-10-22: qty 2

## 2019-10-22 MED ORDER — OXYCODONE HCL 5 MG PO TABS: 5.0000 mg | ORAL_TABLET | ORAL | Status: DC | PRN

## 2019-10-22 MED ORDER — SODIUM CHLORIDE 0.9 % IV SOLN: INTRAVENOUS | Status: DC

## 2019-10-22 MED ORDER — ASPIRIN EC 81 MG PO TBEC: 81.0000 mg | DELAYED_RELEASE_TABLET | Freq: Every day | ORAL | Status: DC

## 2019-10-22 MED ORDER — DIPHENHYDRAMINE HCL 50 MG/ML IJ SOLN: 25.0000 mg | INTRAMUSCULAR | Status: DC

## 2019-10-22 MED ORDER — FAMOTIDINE IN NACL 20-0.9 MG/50ML-% IV SOLN
20.0000 mg | INTRAVENOUS | Status: DC
  Filled 2019-10-22: qty 50

## 2019-10-22 MED ORDER — CLOPIDOGREL BISULFATE 75 MG PO TABS: 75.0000 mg | ORAL_TABLET | Freq: Every day | ORAL | Status: DC

## 2019-10-22 MED ORDER — SODIUM CHLORIDE 0.9% FLUSH: 3.0000 mL | INTRAVENOUS | Status: DC | PRN

## 2019-10-22 MED ORDER — HEPARIN SODIUM (PORCINE) 1000 UNIT/ML IJ SOLN
INTRAMUSCULAR | Status: DC | PRN
  Administered 2019-10-22: 9000 [IU] via INTRAVENOUS

## 2019-10-22 MED ORDER — ONDANSETRON HCL 4 MG/2ML IJ SOLN: 4.0000 mg | Freq: Four times a day (QID) | INTRAMUSCULAR | Status: DC | PRN

## 2019-10-22 MED ORDER — LIDOCAINE HCL (PF) 1 % IJ SOLN
INTRAMUSCULAR | Status: AC
  Filled 2019-10-22: qty 30

## 2019-10-22 MED ORDER — HEPARIN (PORCINE) IN NACL 1000-0.9 UT/500ML-% IV SOLN
INTRAVENOUS | Status: DC | PRN
  Administered 2019-10-22 (×2): 500 mL

## 2019-10-22 MED ORDER — HEPARIN (PORCINE) IN NACL 1000-0.9 UT/500ML-% IV SOLN
INTRAVENOUS | Status: AC
  Filled 2019-10-22: qty 1000

## 2019-10-22 MED ORDER — HEPARIN SODIUM (PORCINE) 1000 UNIT/ML IJ SOLN
INTRAMUSCULAR | Status: AC
  Filled 2019-10-22: qty 1

## 2019-10-22 MED ORDER — ACETAMINOPHEN 325 MG PO TABS
650.0000 mg | ORAL_TABLET | ORAL | Status: DC | PRN
  Filled 2019-10-22: qty 2

## 2019-10-22 MED ORDER — LIDOCAINE HCL (PF) 1 % IJ SOLN
INTRAMUSCULAR | Status: DC | PRN
  Administered 2019-10-22: 18 mL via INTRADERMAL

## 2019-10-22 MED ORDER — MIDAZOLAM HCL 2 MG/2ML IJ SOLN
INTRAMUSCULAR | Status: DC | PRN
  Administered 2019-10-22: 2 mg via INTRAVENOUS
  Administered 2019-10-22: 1 mg via INTRAVENOUS

## 2019-10-22 MED ORDER — FENTANYL CITRATE (PF) 100 MCG/2ML IJ SOLN
INTRAMUSCULAR | Status: AC
  Filled 2019-10-22: qty 2

## 2019-10-22 MED ORDER — FENTANYL CITRATE (PF) 100 MCG/2ML IJ SOLN
INTRAMUSCULAR | Status: DC | PRN
  Administered 2019-10-22: 50 ug via INTRAVENOUS
  Administered 2019-10-22: 25 ug via INTRAVENOUS

## 2019-10-22 MED ORDER — SODIUM CHLORIDE 0.9% FLUSH: 3.0000 mL | Freq: Two times a day (BID) | INTRAVENOUS | Status: DC

## 2019-10-22 MED ORDER — LABETALOL HCL 5 MG/ML IV SOLN: 10.0000 mg | INTRAVENOUS | Status: DC | PRN

## 2019-10-22 SURGICAL SUPPLY — 19 items
CATH OMNI FLUSH 5F 65CM (CATHETERS) ×1 IMPLANT
CLOSURE MYNX CONTROL 6F/7F (Vascular Products) ×1 IMPLANT
DCB RANGER 4.0X100 135 (BALLOONS) IMPLANT
DCB RANGER 4.0X200 150 (BALLOONS) IMPLANT
DRAPE ZERO GRAVITY STERILE (DRAPES) ×1 IMPLANT
KIT ENCORE 26 ADVANTAGE (KITS) ×1 IMPLANT
KIT MICROPUNCTURE NIT STIFF (SHEATH) ×1 IMPLANT
KIT PV (KITS) ×3 IMPLANT
RANGER DCB 4.0X100 135 (BALLOONS) ×3
RANGER DCB 4.0X200 150 (BALLOONS) ×3
SHEATH PINNACLE 5F 10CM (SHEATH) ×1 IMPLANT
SHEATH PINNACLE 6F 10CM (SHEATH) ×1 IMPLANT
SHEATH PINNACLE ST 6F 45CM (SHEATH) ×1 IMPLANT
SHEATH PROBE COVER 6X72 (BAG) ×1 IMPLANT
SYR MEDRAD MARK V 150ML (SYRINGE) ×1 IMPLANT
TRANSDUCER W/STOPCOCK (MISCELLANEOUS) ×3 IMPLANT
TRAY PV CATH (CUSTOM PROCEDURE TRAY) ×3 IMPLANT
WIRE BENTSON .035X145CM (WIRE) ×1 IMPLANT
WIRE G V18X300CM (WIRE) ×1 IMPLANT

## 2019-10-22 NOTE — Interval H&P Note (Signed)
History and Physical Interval Note:  10/22/2019 10:02 AM  Taylor [name]  has presented today for surgery, with the diagnosis of pad.  The various methods of treatment have been discussed with the patient and family. After consideration of risks, benefits and other options for treatment, the patient has consented to  Procedure(s): ABDOMINAL AORTOGRAM W/LOWER EXTREMITY (N/A) as a surgical intervention.  The patient's history has been reviewed, patient examined, no change in status, stable for surgery.  I have reviewed the patient's chart and labs.  Questions were answered to the patient's satisfaction.     [name]

## 2019-10-22 NOTE — Discharge Instructions (Signed)
Femoral Site Care This sheet gives you information about how to care for yourself after your procedure. Your health care provider may also give you more specific instructions. If you have problems or questions, contact your health care provider. What can I expect after the procedure? After the procedure, it is common to have:  Bruising that usually fades within 1-2 weeks.  Tenderness at the site. Follow these instructions at home: Wound care  Follow instructions from your health care provider about how to take care of your insertion site. Make sure you: ? Wash your hands with soap and water before you change your bandage (dressing). If soap and water are not available, use hand sanitizer. ? Change your dressing as told by your health care provider. ? Leave stitches (sutures), skin glue, or adhesive strips in place. These skin closures may need to stay in place for 2 weeks or longer. If adhesive strip edges start to loosen and curl up, you may trim the loose edges. Do not remove adhesive strips completely unless your health care provider tells you to do that.  Do not take baths, swim, or use a hot tub until your health care provider approves.  You may shower 24-48 hours after the procedure or as told by your health care provider. ? Gently wash the site with plain soap and water. ? Pat the area dry with a clean towel. ? Do not rub the site. This may cause bleeding.  Do not apply powder or lotion to the site. Keep the site clean and dry.  Check your femoral site every day for signs of infection. Check for: ? Redness, swelling, or pain. ? Fluid or blood. ? Warmth. ? Pus or a bad smell. Activity  For the first 2-3 days after your procedure, or as long as directed: ? Avoid climbing stairs as much as possible. ? Do not squat.  Do not lift anything that is heavier than 10 lb (4.5 kg), or the limit that you are told, until your health care provider says that it is safe.  Rest as  directed. ? Avoid sitting for a long time without moving. Get up to take short walks every 1-2 hours.  Do not drive for 24 hours if you were given a medicine to help you relax (sedative). General instructions  Take over-the-counter and prescription medicines only as told by your health care provider.  Keep all follow-up visits as told by your health care provider. This is important. Contact a health care provider if you have:  A fever or chills.  You have redness, swelling, or pain around your insertion site. Get help right away if:  The catheter insertion area swells very fast.  You pass out.  You suddenly start to sweat or your skin gets clammy.  The catheter insertion area is bleeding, and the bleeding does not stop when you hold steady pressure on the area.  The area near or just beyond the catheter insertion site becomes pale, cool, tingly, or numb. These symptoms may represent a serious problem that is an emergency. Do not wait to see if the symptoms will go away. Get medical help right away. Call your local emergency services (911 in the [place] Do not drive yourself to the hospital. Summary  After the procedure, it is common to have bruising that usually fades within 1-2 weeks.  Check your femoral site every day for signs of infection.  Do not lift anything that is heavier than 10 lb (4.5 kg), or the   limit that you are told, until your health care provider says that it is safe. This information is not intended to replace advice given to you by your health care provider. Make sure you discuss any questions you have with your health care provider. Document Revised: 06/12/2017 Document Reviewed: 06/12/2017 Elsevier Patient Education  2020 [employer]  

## 2019-10-22 NOTE — Progress Notes (Signed)
Patient's father will be the one picking her up from the hospital.  Taylor [name] [phone]

## 2019-10-22 NOTE — Progress Notes (Signed)
Called client's mother Lattie Haw and gave her d/c instructions via phone including no metformin for  2 days and she voiced understanding

## 2019-10-22 NOTE — Op Note (Signed)
Patient name: Taylor [name] MRN: [id number] DOB: Apr 17, 1981 Sex: female  10/22/2019 Pre-operative Diagnosis: Left leg ulcer Post-operative diagnosis:  Same Surgeon:  [name] Procedure Performed:  1.  Ultrasound-guided access, right femoral artery  2.  Abdominal aortogram  3.  Bilateral lower extremity runoff  4.  Angioplasty left superficial femoral artery  5.  Conscious sedation 46 minutes  6.  Closure device, Mynx     Indications: This is a 39 year old diabetic was previously undergone atherectomy and angioplasty of a left popliteal occlusion.  She then went on to have toe amputations.  These have not healed and follow-up ultrasound reveals recurrent stenosis.  She is here for further evaluation and possible intervention.  Procedure:  The patient was identified in the holding area and taken to room 8.  The patient was then placed supine on the table and prepped and draped in the usual sterile fashion.  A time out was called.  Conscious sedation was administered with the use of IV fentanyl and Versed under continuous physician and nurse monitoring.  Heart rate, blood pressure, and oxygen saturation were continuously monitored.  Total sedation time was 46 minutes.  Ultrasound was used to evaluate the right common femoral artery.  It was patent .  A digital ultrasound image was acquired.  A micropuncture needle was used to access the right common femoral artery under ultrasound guidance.  An 018 wire was advanced without resistance and a micropuncture sheath was placed.  The 018 wire was removed and a benson wire was placed.  The micropuncture sheath was exchanged for a 5 french sheath.  An omniflush catheter was advanced over the wire to the level of L-1.  An abdominal angiogram was obtained.  Next, using the omniflush catheter and a benson wire, the aortic bifurcation was crossed and the catheter was placed into theleft external iliac artery and left runoff was obtained.  right runoff was  performed via retrograde sheath injections.  Findings:   Aortogram: No significant renal artery stenosis.  The infrarenal abdominal aorta is widely patent.  Bilateral common and external iliac arteries are patent without stenosis.  Right Lower Extremity: Right common femoral artery and profundofemoral artery are patent throughout the course.  There is a high-grade lesion at the origin of the right superficial femoral artery.  There is a subtotal occlusion of the distal superficial femoral artery with reconstitution of the popliteal artery and three-vessel runoff.  Left Lower Extremity: The left common femoral artery is widely patent.  There is a high-grade lesion at the origin of the profundofemoral artery.  The superficial femoral artery has several areas of stenosis greater than 70%.  The popliteal arteries pain throughout his course with three-vessel runoff.  Intervention: After the above images were acquired the decision made to proceed with intervention.  Over an 035 wire, a 6 French 45 cm sheath was advanced into the left external iliac artery.  The patient was fully heparinized.  Next a V-18 wire was advanced down the superficial femoral artery into the popliteal artery.  I elected to perform primary drug-coated balloon angioplasty of the lesions.  A 4 x 200 and a 4 x 100 balloon were used.  The balloons were taken to 6 atm for 3 minutes.  Completion imaging revealed resolution of the stenosis with stenosis less than 10% at completion and no change in runoff.  A minx was used for closure.  Impression:  #1  High-grade left profundofemoral stenosis  #2  Multiple diffuse lesions  within the left superficial femoral artery treated with drug-coated balloon angioplasty using a 4 mm balloon with less than 10% residual stenosis  #3  Subtotal occlusion of the right superficial femoral artery at the adductor canal and high-grade lesion at the origin of the right superficial femoral artery.    Theotis Burrow, M.D., [facility] [place] Office: [phone] Pager:  [phone]

## 2019-10-25 ENCOUNTER — Ambulatory Visit: Payer: Medicaid Other | Admitting: Neurology

## 2019-10-28 ENCOUNTER — Other Ambulatory Visit: Payer: Self-pay | Admitting: Neurology

## 2019-10-28 ENCOUNTER — Other Ambulatory Visit: Payer: Self-pay

## 2019-10-28 ENCOUNTER — Encounter: Payer: Self-pay | Admitting: Orthopedic Surgery

## 2019-10-28 ENCOUNTER — Ambulatory Visit: Payer: Self-pay

## 2019-10-28 ENCOUNTER — Ambulatory Visit (INDEPENDENT_AMBULATORY_CARE_PROVIDER_SITE_OTHER): Payer: Medicaid Other | Admitting: Physician Assistant

## 2019-10-28 VITALS — Ht 65.0 in | Wt 197.0 lb

## 2019-10-28 DIAGNOSIS — M86272 Subacute osteomyelitis, left ankle and foot: Secondary | ICD-10-CM

## 2019-10-28 MED ORDER — HYDROCODONE-ACETAMINOPHEN 5-325 MG PO TABS: 1.0000 | ORAL_TABLET | Freq: Two times a day (BID) | ORAL | 0 refills | Status: DC

## 2019-10-28 NOTE — Progress Notes (Signed)
Office Visit Note   Patient: Taylor [name]           Date of Birth: 1981/04/09           MRN: [id number] Visit Date: 10/28/2019              Requested by: Monico Blitz, MD [address] [place],  [place] [address] PCP: Monico Blitz, MD  Chief Complaint  Patient presents with  . Left Foot - Follow-up    3.19mon s/p left fourth and fifth ray amputation      HPI: Patient presents today for 2-week follow-up.  She is 3 and half months status post left fourth and fifth ray amputation.  She recently had revascularization procedure and her foot is actually doing better.  She has started walking on it and does have a little bit of pain she only takes 1-2 hydrocodone daily which is dispensed by her mother  Assessment & Plan: Visit Diagnoses:  1. Subacute osteomyelitis of left foot ([place])     Plan: She will follow up in 4 weeks.  Continue dressing changes  Follow-Up Instructions: No follow-ups on file.   Ortho Exam  Patient is alert, oriented, no adenopathy, well-dressed, normal affect, normal respiratory effort. Focused examination demonstrates excellent ingrowth of skin into the area healthy wound bed no surrounding cellulitis no fluctuance  Imaging: No results found. No images are attached to the encounter.  Labs: Lab Results  Component Value Date   HGBA1C 10.0 (H) 06/23/2019   ESRSEDRATE 36 (H) 07/31/2019   ESRSEDRATE 96 (H) 06/23/2019   CRP 6.5 07/31/2019   CRP 7.9 (H) 06/23/2019   REPTSTATUS 06/30/2019 FINAL 06/23/2019   CULT NO GROWTH 7 DAYS 06/23/2019     Lab Results  Component Value Date   ALBUMIN 2.9 (L) 07/01/2019   ALBUMIN 2.2 (L) 06/26/2019   ALBUMIN 3.6 03/02/2012   PREALBUMIN 17.3 (L) 06/23/2019    Lab Results  Component Value Date   MG 2.2 06/25/2019   MG 1.4 (L) 06/24/2019   No results found for: VD25OH  Lab Results  Component Value Date   PREALBUMIN 17.3 (L) 06/23/2019   CBC EXTENDED Latest Ref Rng & Units 10/22/2019 07/31/2019 07/05/2019  WBC 3.8 -  10.8 Thousand/uL - 5.2 -  RBC 3.80 - 5.10 Million/uL - 4.58 -  HGB 12.0 - 15.0 g/dL 11.6(L) 9.6(L) 8.2(L)  HCT 36.0 - 46.0 % 34.0(L) 34.1(L) 29.8(L)  PLT 140 - 400 Thousand/uL - 265 -  NEUTROABS 1.7 - 7.7 K/uL - - -  LYMPHSABS 0.7 - 4.0 K/uL - - -     Body mass index is 32.78 kg/m.  Orders:  Orders Placed This Encounter  Procedures  . XR Foot 2 Views Left   Meds ordered this encounter  Medications  . HYDROcodone-acetaminophen (NORCO/VICODIN) 5-325 MG tablet    Sig: Take 1 tablet by mouth 2 (two) times daily.    Dispense:  20 tablet    Refill:  0     Procedures: No procedures performed  Clinical Data: No additional findings.  ROS:  All other systems negative, except as noted in the HPI. Review of Systems  Objective: Vital Signs: Ht 5\' 5"  (1.651 m)   Wt 197 lb (89.4 kg)   BMI 32.78 kg/m   Specialty Comments:  No specialty comments available.  PMFS History: Patient Active Problem List   Diagnosis Date Noted  . Obesity, Class III, BMI 40-49.9 (morbid obesity) ([place]) 07/05/2019  . Diabetic foot infection ([place]) 06/23/2019  .  PAD (peripheral artery disease) ([place])   . Osteomyelitis of left foot ([place])   . Vertebral artery stenosis, left 03/25/2019  . VBI (vertebrobasilar insufficiency) 02/20/2019  . Intracranial carotid stenosis, left 01/23/2019  . 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 Tobacco use disorder 01/02/2015  . Cognitive impairment 01/02/2015  . Stroke ([place]) 09/07/2012  . Pain in upper limb 09/07/2012  . Pain in lower limb 09/07/2012  . Bipolar affective disorder ([place]) 09/07/2012  . Depressive disorder 09/07/2012  . Type II diabetes mellitus ([place]) 09/07/2012  . Memory loss 09/07/2012  . 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 Personal history of noncompliance with medical treatment, presenting hazards to health 05/25/2012  . Cerebral artery occlusion with cerebral infarction ([place]) 05/10/2012   Past  Medical History:  Diagnosis Date  . Anxiety   . ARF (acute renal failure) ([place]) 2013  . Bipolar disorder ([place])   . Cerebral artery occlusion with cerebral infarction ([place]) 11/13   due to elevated blood sugar 1700-off insulin 6 months  . Depression   . Diabetes mellitus    off insulin 6 months  . Diverticulitis   . Dyspnea    when walking  . GERD (gastroesophageal reflux disease)   . Headache(784.0)   . Hypertension   . Kidney dialysis status    on dialysis(stroke) off after 4-6 weeks  . Pneumonia 2013, 06/2018  . Respiratory failure ([place]) 11/13   with stroke - in hosp [facility] 12 weeks  . Seizures ([place])    during elvated blood sugsr episode 11/13  . Sepsis ([place])   . Stroke ([place])    11/2018, 12/2018 Weak left side, speech- slurred, Short term memeroy, Gait unsteady   . Vocal cord dysfunction     Family History  Problem Relation Age of Onset  . Lung cancer Maternal Grandmother   . Heart attack Maternal Grandfather   . Diabetes Paternal Grandfather     Past Surgical History:  Procedure Laterality Date  . ABDOMINAL AORTOGRAM W/LOWER EXTREMITY N/A 06/25/2019   Procedure: ABDOMINAL AORTOGRAM W/LOWER EXTREMITY;  Surgeon: Serafina Mitchell, MD;  Location: [facility];  Service: Cardiovascular;  Laterality: N/A;  . ABDOMINAL AORTOGRAM W/LOWER EXTREMITY N/A 10/22/2019   Procedure: ABDOMINAL AORTOGRAM W/LOWER EXTREMITY;  Surgeon: Serafina Mitchell, MD;  Location: [facility];  Service: Cardiovascular;  Laterality: N/A;  . AMPUTATION Left 07/03/2019   Procedure: LEFT FOOT 4TH AND 5TH RAY AMPUTATION;  Surgeon: Newt Minion, MD;  Location: [facility];  Service: Orthopedics;  Laterality: Left;  . APPENDECTOMY    . INSERTION OF DIALYSIS CATHETER  11/13   removed in 4-6 weeks  . IR ANGIO INTRA EXTRACRAN SEL COM CAROTID INNOMINATE BILAT MOD SED  02/07/2019  . IR ANGIO VERTEBRAL SEL VERTEBRAL BILAT MOD SED  02/07/2019  . IR ANGIOGRAM EXTREMITY LEFT  02/07/2019  . IR ANGIOGRAM EXTREMITY RIGHT   02/20/2019  . IR TRANSCATH EXCRAN VERT OR CAR A STENT  02/20/2019  . IR TRANSCATH EXCRAN VERT OR CAR A STENT  03/25/2019  . IR US GUIDE VASC ACCESS RIGHT  02/07/2019  . MULTIPLE EXTRACTIONS WITH ALVEOLOPLASTY N/A 02/24/2014   Procedure: MULTIPLE EXTRACTION WITH ALVEOLOPLASTY AND BIOPSY;  Surgeon: Gae Bon, DDS;  Location: [facility];  Service: Oral Surgery;  Laterality: N/A;  . PERIPHERAL VASCULAR ATHERECTOMY Left 06/25/2019   Procedure: PERIPHERAL VASCULAR ATHERECTOMY;  Surgeon: Serafina Mitchell, MD;  Location: [facility];  Service: Cardiovascular;  Laterality:  Left;  superficial femoral  . PERIPHERAL VASCULAR BALLOON ANGIOPLASTY Left 10/22/2019   Procedure: PERIPHERAL VASCULAR BALLOON ANGIOPLASTY;  Surgeon: Serafina Mitchell, MD;  Location: [facility];  Service: Cardiovascular;  Laterality: Left;  superficial femoral  . RADIOLOGY WITH ANESTHESIA N/A 02/20/2019   Procedure: RADIOLOGY WITH ANESTHESIA   STENTING;  Surgeon: Luanne Bras, MD;  Location: [facility];  Service: Radiology;  Laterality: N/A;  . RADIOLOGY WITH ANESTHESIA N/A 03/25/2019   Procedure: RADIOLOGY WITH ANESTHESIA STENTING;  Surgeon: Luanne Bras, MD;  Location: [facility];  Service: Radiology;  Laterality: N/A;  . SKIN SPLIT GRAFT Left 07/03/2019   Procedure: APPLY SKIN GRAFT LEFT FOOT;  Surgeon: Newt Minion, MD;  Location: [facility];  Service: Orthopedics;  Laterality: Left;  . TRACHEOSTOMY  11/13   closed 1/14  . TRANSRECTAL DRAINAGE OF PELVIC ABSCESS  12/11   Social History   Occupational History  . Not on file  Tobacco Use  . Smoking status: Former Smoker    Packs/day: 0.60    Years: 23.00    Pack years: 13.80    Types: Cigarettes    Quit date: 02/05/2019    Years since quitting: 0.7  . Smokeless tobacco: Never Used  Substance and Sexual Activity  . Alcohol use: No    Alcohol/week: 0.0 standard drinks  . Drug use: No  . Sexual activity: Not Currently    Birth control/protection: None

## 2019-11-04 ENCOUNTER — Telehealth (HOSPITAL_COMMUNITY): Payer: Self-pay

## 2019-11-04 ENCOUNTER — Ambulatory Visit (HOSPITAL_COMMUNITY)
Admission: RE | Admit: 2019-11-04 | Discharge: 2019-11-04 | Disposition: A | Discharge status: Home / Self Care | Payer: Medicaid Other | Source: Ambulatory Visit | Attending: Interventional Radiology | Admitting: Interventional Radiology

## 2019-11-04 ENCOUNTER — Other Ambulatory Visit: Payer: Self-pay

## 2019-11-04 ENCOUNTER — Other Ambulatory Visit: Payer: Self-pay | Admitting: Physician Assistant

## 2019-11-04 DIAGNOSIS — I6502 Occlusion and stenosis of left vertebral artery: Secondary | ICD-10-CM | POA: Insufficient documentation

## 2019-11-04 MED ORDER — IOHEXOL 350 MG/ML SOLN
75.0000 mL | Freq: Once | INTRAVENOUS | Status: AC | PRN
  Administered 2019-11-04: 75 mL via INTRAVENOUS

## 2019-11-04 NOTE — Telephone Encounter (Signed)
Pt agreed to f/u in 3 months with US carotid. AW

## 2019-11-06 ENCOUNTER — Other Ambulatory Visit: Payer: Self-pay | Admitting: Physician Assistant

## 2019-11-06 ENCOUNTER — Encounter: Payer: Self-pay | Admitting: Orthopedic Surgery

## 2019-11-06 MED ORDER — HYDROCODONE-ACETAMINOPHEN 5-325 MG PO TABS: 1.0000 | ORAL_TABLET | Freq: Two times a day (BID) | ORAL | 0 refills | Status: DC

## 2019-11-22 ENCOUNTER — Other Ambulatory Visit: Payer: Self-pay | Admitting: *Deleted

## 2019-11-22 DIAGNOSIS — I70245 Atherosclerosis of native arteries of left leg with ulceration of other part of foot: Secondary | ICD-10-CM

## 2019-11-22 DIAGNOSIS — I739 Peripheral vascular disease, unspecified: Secondary | ICD-10-CM

## 2019-11-25 ENCOUNTER — Other Ambulatory Visit: Payer: Self-pay | Admitting: Neurology

## 2019-11-25 ENCOUNTER — Ambulatory Visit (INDEPENDENT_AMBULATORY_CARE_PROVIDER_SITE_OTHER): Payer: Medicaid Other | Admitting: Physician Assistant

## 2019-11-25 ENCOUNTER — Other Ambulatory Visit: Payer: Self-pay

## 2019-11-25 ENCOUNTER — Ambulatory Visit (INDEPENDENT_AMBULATORY_CARE_PROVIDER_SITE_OTHER)
Admission: RE | Admit: 2019-11-25 | Discharge: 2019-11-25 | Disposition: A | Discharge status: Home / Self Care | Payer: Medicaid Other | Source: Ambulatory Visit | Attending: Vascular Surgery | Admitting: Vascular Surgery

## 2019-11-25 ENCOUNTER — Ambulatory Visit (HOSPITAL_COMMUNITY)
Admission: RE | Admit: 2019-11-25 | Discharge: 2019-11-25 | Disposition: A | Discharge status: Home / Self Care | Payer: Medicaid Other | Source: Ambulatory Visit | Attending: Vascular Surgery | Admitting: Vascular Surgery

## 2019-11-25 ENCOUNTER — Encounter: Payer: Self-pay | Admitting: Orthopedic Surgery

## 2019-11-25 ENCOUNTER — Ambulatory Visit (INDEPENDENT_AMBULATORY_CARE_PROVIDER_SITE_OTHER): Payer: Medicaid Other | Admitting: Orthopedic Surgery

## 2019-11-25 VITALS — Ht 65.0 in | Wt 197.0 lb

## 2019-11-25 VITALS — BP 119/71 | HR 96 | Temp 97.5°F | Resp 16 | Ht 65.0 in

## 2019-11-25 DIAGNOSIS — M86272 Subacute osteomyelitis, left ankle and foot: Secondary | ICD-10-CM

## 2019-11-25 DIAGNOSIS — I70245 Atherosclerosis of native arteries of left leg with ulceration of other part of foot: Secondary | ICD-10-CM | POA: Insufficient documentation

## 2019-11-25 DIAGNOSIS — I739 Peripheral vascular disease, unspecified: Secondary | ICD-10-CM | POA: Insufficient documentation

## 2019-11-25 NOTE — Progress Notes (Signed)
HISTORY AND PHYSICAL     CC:  follow up. Requesting Provider:  Monico Blitz, MD  HPI: This is a 39 y.o. female who is here today for follow up for angiogram with angioplasty of left SFA on 10/22/2019 by Dr. Trula Slade.    Pt initially had a left foot inspection with cellulitis and abscess complicated by fifth toe osteomyelitis. While at [facility] she underwent incision and drainage of this area. ABIs were performed which showed moderate arterial disease in the left and so she was sent to [facility]. On 06/25/2019 she went for angiography. She was found to have a 90% mid superficial femoral artery lesion and an occluded short segment popliteal artery. These were both successfully crossed and treated with jetstream atherectomy and drug-coated balloon angioplasty using a 4 mm balloon. She was also found to have a 90% profunda origin stenosis which was not treated. On 07/03/2019 Dr. Sharol Given performed a left fourth and fifth ray amputation along with placement of ACell.  She was seen by PA with Dr. Sharol Given this am and she was having some healing but improving.  She did not have any surrounding cellulitis.    The patient has history of acute renal failure, but creatinine is normal on admission. She is a diabetic which is poorly controlled. She does have a history of stroke, status post vertebral artery stenting. She is a former smoker. The patient is now on a statin for hypercholesterolemia. She is bipolar.  The pt returns today for follow up.  She states that her wound is improving but in the past few days starting hurting and is requesting pain medication.  She states that she did not mention pain medication at the visit with Dr. Jess Barters office this morning.  She does not have any pain issues or wounds on the right foot.   The pt is on a statin for cholesterol management.    The pt is on an aspirin.    Other AC:  Plavix The pt is on ACEI for hypertension.  The pt does have diabetes. Tobacco hx:   Former-quit August 2020  Pt does not have family hx of AAA.  Past Medical History:  Diagnosis Date  . Anxiety   . ARF (acute renal failure) ([place]) 2013  . Bipolar disorder ([place])   . Cerebral artery occlusion with cerebral infarction ([place]) 11/13   due to elevated blood sugar 1700-off insulin 6 months  . Depression   . Diabetes mellitus    off insulin 6 months  . Diverticulitis   . Dyspnea    when walking  . GERD (gastroesophageal reflux disease)   . Headache(784.0)   . Hypertension   . Kidney dialysis status    on dialysis(stroke) off after 4-6 weeks  . Pneumonia 2013, 06/2018  . Respiratory failure ([place]) 11/13   with stroke - in hosp [facility] 12 weeks  . Seizures ([place])    during elvated blood sugsr episode 11/13  . Sepsis ([place])   . Stroke ([place])    11/2018, 12/2018 Weak left side, speech- slurred, Short term memeroy, Gait unsteady   . Vocal cord dysfunction     Past Surgical History:  Procedure Laterality Date  . ABDOMINAL AORTOGRAM W/LOWER EXTREMITY N/A 06/25/2019   Procedure: ABDOMINAL AORTOGRAM W/LOWER EXTREMITY;  Surgeon: Serafina Mitchell, MD;  Location: [place] [facility];  Service: Cardiovascular;  Laterality: N/A;  . ABDOMINAL AORTOGRAM W/LOWER EXTREMITY N/A 10/22/2019   Procedure: ABDOMINAL AORTOGRAM W/LOWER EXTREMITY;  Surgeon: Serafina Mitchell, MD;  Location:  [place] IN[facility]AS[facility];  Service: Cardiovascular;  Laterality: N/A;  . AMPUTATION Left 07/03/2019   Procedure: LEFT FOOT 4TH AND 5TH RAY AMPUTATION;  Surgeon: Newt Minion, MD;  Location: [place];  Service: Orthopedics;  Laterality: Left;  . APPENDECTOMY    . INSERTION OF DIALYSIS CATHETER  11/13   removed in 4-6 weeks  . IR ANGIO INTRA EXTRACRAN SEL COM CAROTID INNOMINATE BILAT MOD SED  02/07/2019  . IR ANGIO VERTEBRAL SEL VERTEBRAL BILAT MOD SED  02/07/2019  . IR ANGIOGRAM EXTREMITY LEFT  02/07/2019  . IR ANGIOGRAM EXTREMITY RIGHT  02/20/2019  . IR TRANSCATH EXCRAN VERT OR CAR A STENT  02/20/2019  . IR TRANSCATH EXCRAN VERT  OR CAR A STENT  03/25/2019  . IR US GUIDE VASC ACCESS RIGHT  02/07/2019  . MULTIPLE EXTRACTIONS WITH ALVEOLOPLASTY N/A 02/24/2014   Procedure: MULTIPLE EXTRACTION WITH ALVEOLOPLASTY AND BIOPSY;  Surgeon: Gae Bon, DDS;  Location: [place];  Service: Oral Surgery;  Laterality: N/A;  . PERIPHERAL VASCULAR ATHERECTOMY Left 06/25/2019   Procedure: PERIPHERAL VASCULAR ATHERECTOMY;  Surgeon: Serafina Mitchell, MD;  Location: [place] [facility];  Service: Cardiovascular;  Laterality: Left;  superficial femoral  . PERIPHERAL VASCULAR BALLOON ANGIOPLASTY Left 10/22/2019   Procedure: PERIPHERAL VASCULAR BALLOON ANGIOPLASTY;  Surgeon: Serafina Mitchell, MD;  Location: [place] [facility];  Service: Cardiovascular;  Laterality: Left;  superficial femoral  . RADIOLOGY WITH ANESTHESIA N/A 02/20/2019   Procedure: RADIOLOGY WITH ANESTHESIA   STENTING;  Surgeon: Luanne Bras, MD;  Location: [place];  Service: Radiology;  Laterality: N/A;  . RADIOLOGY WITH ANESTHESIA N/A 03/25/2019   Procedure: RADIOLOGY WITH ANESTHESIA STENTING;  Surgeon: Luanne Bras, MD;  Location: [place];  Service: Radiology;  Laterality: N/A;  . SKIN SPLIT GRAFT Left 07/03/2019   Procedure: APPLY SKIN GRAFT LEFT FOOT;  Surgeon: Newt Minion, MD;  Location: [place];  Service: Orthopedics;  Laterality: Left;  . TRACHEOSTOMY  11/13   closed 1/14  . TRANSRECTAL DRAINAGE OF PELVIC ABSCESS  12/11    Allergies  Allergen Reactions  . Contrast Media [Iodinated Diagnostic Agents] Other (See Comments)    Went into kidney failure   . Amitriptyline Other (See Comments)    Unresponsive   . Compazine Other (See Comments)    Alert but unable to move  . Morphine And Related Other (See Comments)    Chest pain  . Oxycodone Nausea And Vomiting    Severe n/v -  patient will not take oxycodone or percocet.  . Promethazine Swelling    Lips swell  . Adhesive [Tape] Rash    Long term exposure causes skin to tear   . Sumatriptan Rash    Current  Outpatient Medications  Medication Sig Dispense Refill  . aspirin 81 MG EC tablet Take 81 mg by mouth daily.     Marland Kitchen atorvastatin (LIPITOR) 40 MG tablet TAKE 1 TABLET BY MOUTH EVERY DAY 30 tablet 5  . blood glucose meter kit and supplies Dispense based on patient and insurance preference. Use up to four times daily as directed. (FOR ICD-10 E10.9, E11.9). 1 each 0  . clopidogrel (PLAVIX) 75 MG tablet Take 1 tablet (75 mg total) by mouth daily. 30 tablet 3  . dapagliflozin propanediol (FARXIGA) 10 MG TABS tablet Take 10 mg by mouth daily.     . diazepam (VALIUM) 5 MG tablet Take 5 mg by mouth 2 (two) times daily.     . diphenhydrAMINE (BENADRYL) 25 MG  tablet Take 50 mg by mouth See admin instructions. Take 2 tablets (50 mg) by mouth one hour prior to procedure involving contrast dye    . docusate sodium (COLACE) 100 MG capsule Take 1 capsule (100 mg total) by mouth 2 (two) times daily. 10 capsule 0  . doxycycline (VIBRA-TABS) 100 MG tablet TAKE 1 TABLET BY MOUTH TWICE DAILY 30 tablet 0  . DULoxetine (CYMBALTA) 30 MG capsule Take 30 mg by mouth daily.    Marland Kitchen gabapentin (NEURONTIN) 300 MG capsule Take 3 capsules (900 mg total) by mouth 3 (three) times daily. 410 capsule 1  . HYDROcodone-acetaminophen (NORCO/VICODIN) 5-325 MG tablet Take 1 tablet by mouth 2 (two) times daily. 20 tablet 0  . ibuprofen (ADVIL) 600 MG tablet Take 600 mg by mouth every 8 (eight) hours as needed for pain.    Marland Kitchen labetalol (NORMODYNE) 300 MG tablet Take 300 mg by mouth 2 (two) times daily.    Marland Kitchen levETIRAcetam (KEPPRA XR) 500 MG 24 hr tablet Take 500 mg by mouth 2 (two) times daily.     Marland Kitchen lisinopril (ZESTRIL) 40 MG tablet Take 40 mg by mouth daily.     . metFORMIN (GLUCOPHAGE) 500 MG tablet Take 500 mg by mouth 2 (two) times daily.    . nitroGLYCERIN (NITRODUR - DOSED IN MG/24 HR) 0.2 mg/hr patch Place 1 patch (0.2 mg total) onto the skin daily. Place patch on foot in different spot daily 30 patch 12  . omeprazole (PRILOSEC) 20 MG  capsule Take 20 mg by mouth daily.    . risperiDONE (RISPERDAL) 0.5 MG tablet Take 0.5 mg by mouth at bedtime.    . silver sulfADIAZINE (SILVADENE) 1 % cream Apply 1 application topically daily. 400 g 0  . traZODone (DESYREL) 100 MG tablet Take 200 mg by mouth at bedtime.      No current facility-administered medications for this visit.    Family History  Problem Relation Age of Onset  . Lung cancer Maternal Grandmother   . Heart attack Maternal Grandfather   . Diabetes Paternal Grandfather     Social History   Socioeconomic History  . Marital status: Single    Spouse name: Not on file  . Number of children: 0  . Years of education: 49  . Highest education level: Not on file  Occupational History  . Not on file  Tobacco Use  . Smoking status: Former Smoker    Packs/day: 0.60    Years: 23.00    Pack years: 13.80    Types: Cigarettes    Quit date: 02/05/2019    Years since quitting: 0.8  . Smokeless tobacco: Never Used  Vaping Use  . Vaping Use: Never used  Substance and Sexual Activity  . Alcohol use: No    Alcohol/week: 0.0 standard drinks  . Drug use: No  . Sexual activity: Not Currently    Birth control/protection: None  Other Topics Concern  . Not on file  Social History Narrative   She is single and does not have any children.    She has 2 yrs of college level education.   She has 5-6 caffeine drinks daily.    Left handed      Living with parents.   Social Determinants of Health   Financial Resource Strain:   . Difficulty of Paying Living Expenses:   Food Insecurity:   . Worried About Charity fundraiser in the Last Year:   . [place] in the Last  Year:   Transportation Needs:   . Film/video editor (Medical):   Marland Kitchen Lack of Transportation (Non-Medical):   Physical Activity:   . Days of Exercise per Week:   . Minutes of Exercise per Session:   Stress:   . Feeling of Stress :   Social Connections:   . Frequency of Communication with Friends  and Family:   . Frequency of Social Gatherings with Friends and Family:   . Attends Religious Services:   . Active Member of Clubs or Organizations:   . Attends Archivist Meetings:   Marland Kitchen Marital Status:   Intimate Partner Violence:   . Fear of Current or Ex-Partner:   . Emotionally Abused:   Marland Kitchen Physically Abused:   . Sexually Abused:      REVIEW OF SYSTEMS:   [X] denotes positive finding, [ ] denotes negative finding Cardiac  Comments:  Chest pain or chest pressure:    Shortness of breath upon exertion:    Short of breath when lying flat:    Irregular heart rhythm:        Vascular    Pain in calf, thigh, or hip brought on by ambulation:    Pain in feet at night that wakes you up from your sleep:     Blood clot in your veins:    Leg swelling:         Pulmonary    Oxygen at home:    Productive cough:     Wheezing:         Neurologic    Sudden weakness in arms or legs:     Sudden numbness in arms or legs:     Sudden onset of difficulty speaking or slurred speech:    Temporary loss of vision in one eye:     Problems with dizziness:         Gastrointestinal    Blood in stool:     Vomited blood:         Genitourinary    Burning when urinating:     Blood in urine:        Psychiatric    Major depression:         Hematologic    Bleeding problems:    Problems with blood clotting too easily:        Skin    Rashes or ulcers:        Constitutional    Fever or chills:      PHYSICAL EXAMINATION:  Today's Vitals   11/25/19 1309  BP: 119/71  Pulse: 96  Resp: 16  Temp: (!) 97.5 F (36.4 C)  TempSrc: Temporal  SpO2: 98%  Height: 5' 5" (1.651 m)  PainSc: 8    Body mass index is 32.78 kg/m.   General:  WDWN in NAD; vital signs documented above Gait: Not observed HENT: WNL, normocephalic Pulmonary: normal non-labored breathing , without wheezing Cardiac: regular HR, without  Murmur; without carotid bruits Abdomen: soft, NT, no masses Skin:  without rashes Vascular Exam/Pulses:  Right Left  Radial 2+ (normal) 2+ (normal)  Ulnar Unable to palpate  Unable to palpate   Femoral Unable to palpate due to body habitus 2+ (normal)  DP Unable to palpate  Unable to palpate   PT Unable to palpate  2+ (normal)   Extremities: right foot without wounds.  Left foot with surgical wound that appears to be healing  Neurologic: A&O X 3;  No focal weakness or paresthesias are detected Psychiatric:  The pt has Normal affect.   Non-Invasive Vascular Imaging:   [name]'s/TBI's on 11/25/2019: Right:  0.48/0.40 - Great toe pressure: 44 Left:  0.94/0.60 - Great toe pressure: 65  Arterial duplex on 11/25/2019: +-----------+---------+-----+---------------+----------+-------------+  LEFT    PSV cm/s RatioStenosis    Waveform Comments     +-----------+---------+-----+---------------+----------+-------------+  CFA Prox  146              monophasic         +-----------+---------+-----+---------------+----------+-------------+  DFA    63               biphasic          +-----------+---------+-----+---------------+----------+-------------+  SFA Prox  153 / 322   50-74% stenosismonophasic         +-----------+---------+-----+---------------+----------+-------------+  SFA Mid  211 / 298   50-74% stenosismonophasicmid to distal  +-----------+---------+-----+---------------+----------+-------------+  SFA Distal 209              monophasic         +-----------+---------+-----+---------------+----------+-------------+  POP Prox  101              monophasic         +-----------+---------+-----+---------------+----------+-------------+  POP Mid  107              monophasic         +-----------+---------+-----+---------------+----------+-------------+  POP Distal 79                monophasic         +-----------+---------+-----+---------------+----------+-------------+  ATA Distal 82               monophasic         +-----------+---------+-----+---------------+----------+-------------+  PTA Distal 57               monophasic         +-----------+---------+-----+---------------+----------+-------------+  PERO Distal26               monophasic         +-----------+---------+-----+---------------+----------+-------------+   Summary:  Left: 50 - 74% stenosis in the proximal and mid to distal SFA. Cannot rule out a more proximal disease.   Previous [name]'s/TBI's on 10/14/2019: Right:  0.51/0.43 - Great toe pressure: 63 Left:  0.90/0.71 - Great toe pressure:  103  Previous arterial duplex on 10/14/2019: Summary:  Left: Serial stenoses observed in the superficial femoral artery;  proximal, mid and distal suggesting 50-74% stenosis in each segment.     ASSESSMENT/PLAN:: 39 y.o. female here for follow up for angioplasty of the left SFA on 10/22/2019 for non healing wound of her LLE here today for follow up.  -pt with improved [name] and palpable left PT.  Pt's wound on the dorsum of the foot improving per Dr. Sharol Given (today is the first time I am seeing her).   Discussed with Dr. Trula Slade and will have her return in 3 months with repeat study and see Dr. Trula Slade at that time.  She knows to call sooner if her wound starts to worsen.  Hopeful with palpable right PT pulse that she will heal her wound.  -pt requesting narcotics this visit.  Discussed with pt that she would need to contact Dr. Jess Barters office since he operated on her and have most recently prescribed narcotics.  We do not prescribe pain medication for angiogram. -continue statin/asa/plavix   Leontine Locket, [facility] [phone]  Clinic MD:   Trula Slade

## 2019-11-25 NOTE — Progress Notes (Signed)
Office Visit Note   Patient: Taylor [name]           Date of Birth: September 09, 1980           MRN: [id number] Visit Date: 11/25/2019              Requested by: Monico Blitz, MD [address] [place],  [place] [address] PCP: Monico Blitz, MD  Chief Complaint  Patient presents with  . Left Foot - Follow-up    07/03/19 Left foot 4th & 5th ray      HPI: Patient is 5 months status post left fourth and fifth ray amputation.  This had been slow to heal.  However she has undergone revascularization.  And since then things have improved significantly she is no longer taking any Vicodin though she still has pain which she is managing with Tylenol  Assessment & Plan: Visit Diagnoses: No diagnosis found.  Plan: Patient will follow up in 1 month.  Hopefully this will be her last visit.  Follow-Up Instructions: No follow-ups on file.   Ortho Exam  Patient is alert, oriented, no adenopathy, well-dressed, normal affect, normal respiratory effort. Focused examination demonstrates incision overall is well-healed now without any surrounding cellulitis there is a 2 x 2 centimeter area that is at the skin surface that is still healing in but it is good vascular tissue no foul odor  Imaging: No results found. No images are attached to the encounter.  Labs: Lab Results  Component Value Date   HGBA1C 10.0 (H) 06/23/2019   ESRSEDRATE 36 (H) 07/31/2019   ESRSEDRATE 96 (H) 06/23/2019   CRP 6.5 07/31/2019   CRP 7.9 (H) 06/23/2019   REPTSTATUS 06/30/2019 FINAL 06/23/2019   CULT NO GROWTH 7 DAYS 06/23/2019     Lab Results  Component Value Date   ALBUMIN 2.9 (L) 07/01/2019   ALBUMIN 2.2 (L) 06/26/2019   ALBUMIN 3.6 03/02/2012   PREALBUMIN 17.3 (L) 06/23/2019    Lab Results  Component Value Date   MG 2.2 06/25/2019   MG 1.4 (L) 06/24/2019   No results found for: VD25OH  Lab Results  Component Value Date   PREALBUMIN 17.3 (L) 06/23/2019   CBC EXTENDED Latest Ref Rng & Units 10/22/2019 07/31/2019  07/05/2019  WBC 3.8 - 10.8 Thousand/uL - 5.2 -  RBC 3.80 - 5.10 Million/uL - 4.58 -  HGB 12.0 - 15.0 g/dL 11.6(L) 9.6(L) 8.2(L)  HCT 36 - 46 % 34.0(L) 34.1(L) 29.8(L)  PLT 140 - 400 Thousand/uL - 265 -  NEUTROABS 1.7 - 7.7 K/uL - - -  LYMPHSABS 0.7 - 4.0 K/uL - - -     Body mass index is 32.78 kg/m.  Orders:  No orders of the defined types were placed in this encounter.  No orders of the defined types were placed in this encounter.    Procedures: No procedures performed  Clinical Data: No additional findings.  ROS:  All other systems negative, except as noted in the HPI. Review of Systems  Objective: Vital Signs: Ht 5\' 5"  (1.651 m)   Wt 197 lb (89.4 kg)   BMI 32.78 kg/m   Specialty Comments:  No specialty comments available.  PMFS History: Patient Active Problem List   Diagnosis Date Noted  . Obesity, Class III, BMI 40-49.9 (morbid obesity) ([place]) 07/05/2019  . Diabetic foot infection ([place]) 06/23/2019  . PAD (peripheral artery disease) ([place])   . Osteomyelitis of left foot ([place])   . Vertebral artery stenosis, left 03/25/2019  .  VBI (vertebrobasilar insufficiency) 02/20/2019  . Intracranial carotid stenosis, left 01/23/2019  . 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 Tobacco use disorder 01/02/2015  . Cognitive impairment 01/02/2015  . Stroke ([place]) 09/07/2012  . Pain in upper limb 09/07/2012  . Pain in lower limb 09/07/2012  . Bipolar affective disorder ([place]) 09/07/2012  . Depressive disorder 09/07/2012  . Type II diabetes mellitus ([place]) 09/07/2012  . Memory loss 09/07/2012  . 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 Personal history of noncompliance with medical treatment, presenting hazards to health 05/25/2012  . Cerebral artery occlusion with cerebral infarction ([place]) 05/10/2012   Past Medical History:  Diagnosis Date  . Anxiety   . ARF (acute renal failure) ([place]) 2013  . Bipolar disorder ([place])   .  Cerebral artery occlusion with cerebral infarction ([place]) 11/13   due to elevated blood sugar 1700-off insulin 6 months  . Depression   . Diabetes mellitus    off insulin 6 months  . Diverticulitis   . Dyspnea    when walking  . GERD (gastroesophageal reflux disease)   . Headache(784.0)   . Hypertension   . Kidney dialysis status    on dialysis(stroke) off after 4-6 weeks  . Pneumonia 2013, 06/2018  . Respiratory failure ([place]) 11/13   with stroke - in hosp ncb[facility] 12 weeks  . Seizures ([place])    during elvated blood sugsr episode 11/13  . Sepsis ([place])   . Stroke ([place])    11/2018, 12/2018 Weak left side, speech- slurred, Short term memeroy, Gait unsteady   . Vocal cord dysfunction     Family History  Problem Relation Age of Onset  . Lung cancer Maternal Grandmother   . Heart attack Maternal Grandfather   . Diabetes Paternal Grandfather     Past Surgical History:  Procedure Laterality Date  . ABDOMINAL AORTOGRAM W/LOWER EXTREMITY N/A 06/25/2019   Procedure: ABDOMINAL AORTOGRAM W/LOWER EXTREMITY;  Surgeon: Serafina Mitchell, MD;  Location: [place] [facility];  Service: Cardiovascular;  Laterality: N/A;  . ABDOMINAL AORTOGRAM W/LOWER EXTREMITY N/A 10/22/2019   Procedure: ABDOMINAL AORTOGRAM W/LOWER EXTREMITY;  Surgeon: Serafina Mitchell, MD;  Location: [place] [facility];  Service: Cardiovascular;  Laterality: N/A;  . AMPUTATION Left 07/03/2019   Procedure: LEFT FOOT 4TH AND 5TH RAY AMPUTATION;  Surgeon: Newt Minion, MD;  Location: [place];  Service: Orthopedics;  Laterality: Left;  . APPENDECTOMY    . INSERTION OF DIALYSIS CATHETER  11/13   removed in 4-6 weeks  . IR ANGIO INTRA EXTRACRAN SEL COM CAROTID INNOMINATE BILAT MOD SED  02/07/2019  . IR ANGIO VERTEBRAL SEL VERTEBRAL BILAT MOD SED  02/07/2019  . IR ANGIOGRAM EXTREMITY LEFT  02/07/2019  . IR ANGIOGRAM EXTREMITY RIGHT  02/20/2019  . IR TRANSCATH EXCRAN VERT OR CAR A STENT  02/20/2019  . IR TRANSCATH EXCRAN VERT OR CAR A STENT  03/25/2019   . IR US GUIDE VASC ACCESS RIGHT  02/07/2019  . MULTIPLE EXTRACTIONS WITH ALVEOLOPLASTY N/A 02/24/2014   Procedure: MULTIPLE EXTRACTION WITH ALVEOLOPLASTY AND BIOPSY;  Surgeon: Gae Bon, DDS;  Location: [place];  Service: Oral Surgery;  Laterality: N/A;  . PERIPHERAL VASCULAR ATHERECTOMY Left 06/25/2019   Procedure: PERIPHERAL VASCULAR ATHERECTOMY;  Surgeon: Serafina Mitchell, MD;  Location: [place] [facility];  Service: Cardiovascular;  Laterality: Left;  superficial femoral  . PERIPHERAL VASCULAR BALLOON ANGIOPLASTY Left 10/22/2019   Procedure: PERIPHERAL VASCULAR BALLOON ANGIOPLASTY;  Surgeon: Harold Barban  W, MD;  Location: [place] [facility];  Service: Cardiovascular;  Laterality: Left;  superficial femoral  . RADIOLOGY WITH ANESTHESIA N/A 02/20/2019   Procedure: RADIOLOGY WITH ANESTHESIA   STENTING;  Surgeon: Luanne Bras, MD;  Location: [place];  Service: Radiology;  Laterality: N/A;  . RADIOLOGY WITH ANESTHESIA N/A 03/25/2019   Procedure: RADIOLOGY WITH ANESTHESIA STENTING;  Surgeon: Luanne Bras, MD;  Location: [place];  Service: Radiology;  Laterality: N/A;  . SKIN SPLIT GRAFT Left 07/03/2019   Procedure: APPLY SKIN GRAFT LEFT FOOT;  Surgeon: Newt Minion, MD;  Location: [place];  Service: Orthopedics;  Laterality: Left;  . TRACHEOSTOMY  11/13   closed 1/14  . TRANSRECTAL DRAINAGE OF PELVIC ABSCESS  12/11   Social History   Occupational History  . Not on file  Tobacco Use  . Smoking status: Former Smoker    Packs/day: 0.60    Years: 23.00    Pack years: 13.80    Types: Cigarettes    Quit date: 02/05/2019    Years since quitting: 0.8  . Smokeless tobacco: Never Used  Vaping Use  . Vaping Use: Never used  Substance and Sexual Activity  . Alcohol use: No    Alcohol/week: 0.0 standard drinks  . Drug use: No  . Sexual activity: Not Currently    Birth control/protection: None

## 2019-11-27 ENCOUNTER — Other Ambulatory Visit: Payer: Self-pay | Admitting: *Deleted

## 2019-11-27 DIAGNOSIS — I739 Peripheral vascular disease, unspecified: Secondary | ICD-10-CM

## 2019-11-27 DIAGNOSIS — I70245 Atherosclerosis of native arteries of left leg with ulceration of other part of foot: Secondary | ICD-10-CM

## 2019-12-05 ENCOUNTER — Other Ambulatory Visit: Payer: Self-pay | Admitting: Physician Assistant

## 2019-12-23 ENCOUNTER — Ambulatory Visit: Payer: Medicaid Other | Admitting: Orthopedic Surgery

## 2020-01-26 ENCOUNTER — Other Ambulatory Visit: Payer: Self-pay | Admitting: Neurology

## 2020-02-03 ENCOUNTER — Other Ambulatory Visit: Payer: Self-pay | Admitting: Neurology

## 2020-02-24 ENCOUNTER — Encounter (HOSPITAL_COMMUNITY): Payer: Medicaid Other

## 2020-02-24 ENCOUNTER — Ambulatory Visit: Payer: Medicaid Other | Admitting: Surgery

## 2020-02-27 ENCOUNTER — Encounter (HOSPITAL_COMMUNITY): Payer: Medicaid Other

## 2020-02-27 ENCOUNTER — Ambulatory Visit: Payer: Medicaid Other

## 2020-03-26 ENCOUNTER — Other Ambulatory Visit: Payer: Self-pay | Admitting: Neurology

## 2020-03-27 ENCOUNTER — Ambulatory Visit (INDEPENDENT_AMBULATORY_CARE_PROVIDER_SITE_OTHER)
Admission: RE | Admit: 2020-03-27 | Discharge: 2020-03-27 | Disposition: A | Discharge status: Home / Self Care | Payer: Medicaid Other | Source: Ambulatory Visit | Attending: Physician Assistant | Admitting: Physician Assistant

## 2020-03-27 ENCOUNTER — Ambulatory Visit (HOSPITAL_COMMUNITY)
Admission: RE | Admit: 2020-03-27 | Discharge: 2020-03-27 | Disposition: A | Discharge status: Home / Self Care | Payer: Medicaid Other | Source: Ambulatory Visit | Attending: Physician Assistant | Admitting: Physician Assistant

## 2020-03-27 ENCOUNTER — Ambulatory Visit (INDEPENDENT_AMBULATORY_CARE_PROVIDER_SITE_OTHER): Payer: Medicaid Other | Admitting: Physician Assistant

## 2020-03-27 ENCOUNTER — Other Ambulatory Visit: Payer: Self-pay

## 2020-03-27 VITALS — BP 133/84 | HR 92 | Temp 98.7°F | Resp 20 | Ht 65.0 in | Wt 214.8 lb

## 2020-03-27 DIAGNOSIS — I739 Peripheral vascular disease, unspecified: Secondary | ICD-10-CM

## 2020-03-27 DIAGNOSIS — I70245 Atherosclerosis of native arteries of left leg with ulceration of other part of foot: Secondary | ICD-10-CM | POA: Diagnosis present

## 2020-03-27 DIAGNOSIS — I70222 Atherosclerosis of native arteries of extremities with rest pain, left leg: Secondary | ICD-10-CM | POA: Diagnosis not present

## 2020-03-27 MED ORDER — HYDROCODONE-ACETAMINOPHEN 10-325 MG PO TABS: 1.0000 | ORAL_TABLET | ORAL | 0 refills | Status: DC | PRN

## 2020-03-27 NOTE — Progress Notes (Signed)
Office Note     CC:  follow up Requesting Provider:  Monico Blitz, MD  HPI: Taylor [name] is a 39 y.o. (12-29-1980) female who presents for 77-month follow-up peripheral arterial disease.  She underwent arteriogram with angioplasty of the left SFA on Oct 22, 2019 by Dr. Trula Slade.  An arteriogram performed on June 25, 2019 revealed 90% mid to superficial femoral artery lesion and an occluded short segment popliteal artery.  These were treated with atherectomy and drug-coated balloon angioplasty. 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 Sharol Given performed left fourth and fifth ray amputation.  Her amputation site healed completely.  Today the patient reports unrelenting rest pain that is crescendoed over the last month.  She states she is waking up every night now in pain.  She is compliant with Aspirin and Plavix. She stopped smoking in 2020.   Past Medical History:  Diagnosis Date  . Anxiety   . ARF (acute renal failure) ([place]) 2013  . Bipolar disorder ([place])   . Cerebral artery occlusion with cerebral infarction ([place]) 11/13   due to elevated blood sugar 1700-off insulin 6 months  . Depression   . Diabetes mellitus    off insulin 6 months  . Diverticulitis   . Dyspnea    when walking  . GERD (gastroesophageal reflux disease)   . Headache(784.0)   . Hypertension   . Kidney dialysis status    on dialysis(stroke) off after 4-6 weeks  . Pneumonia 2013, 06/2018  . Respiratory failure ([place]) 11/13   with stroke - in hosp [facility] 12 weeks  . Seizures ([place])    during elvated blood sugsr episode 11/13  . Sepsis ([place])   . Stroke ([place])    11/2018, 12/2018 Weak left side, speech- slurred, Short term memeroy, Gait unsteady   . Vocal cord dysfunction     Past Surgical History:  Procedure Laterality Date  . ABDOMINAL AORTOGRAM W/LOWER EXTREMITY N/A 06/25/2019   Procedure: ABDOMINAL AORTOGRAM W/LOWER EXTREMITY;  Surgeon:  Serafina Mitchell, MD;  Location: [facility];  Service: Cardiovascular;  Laterality: N/A;  . ABDOMINAL AORTOGRAM W/LOWER EXTREMITY N/A 10/22/2019   Procedure: ABDOMINAL AORTOGRAM W/LOWER EXTREMITY;  Surgeon: Serafina Mitchell, MD;  Location: [facility];  Service: Cardiovascular;  Laterality: N/A;  . AMPUTATION Left 07/03/2019   Procedure: LEFT FOOT 4TH AND 5TH RAY AMPUTATION;  Surgeon: Newt Minion, MD;  Location: [facility];  Service: Orthopedics;  Laterality: Left;  . APPENDECTOMY    . INSERTION OF DIALYSIS CATHETER  11/13   removed in 4-6 weeks  . IR ANGIO INTRA EXTRACRAN SEL COM CAROTID INNOMINATE BILAT MOD SED  02/07/2019  . IR ANGIO VERTEBRAL SEL VERTEBRAL BILAT MOD SED  02/07/2019  . IR ANGIOGRAM EXTREMITY LEFT  02/07/2019  . IR ANGIOGRAM EXTREMITY RIGHT  02/20/2019  . IR TRANSCATH EXCRAN VERT OR CAR A STENT  02/20/2019  . IR TRANSCATH EXCRAN VERT OR CAR A STENT  03/25/2019  . IR US GUIDE VASC ACCESS RIGHT  02/07/2019  . MULTIPLE EXTRACTIONS WITH ALVEOLOPLASTY N/A 02/24/2014   Procedure: MULTIPLE EXTRACTION WITH ALVEOLOPLASTY AND BIOPSY;  Surgeon: Gae Bon, DDS;  Location: [facility];  Service: Oral Surgery;  Laterality: N/A;  . PERIPHERAL VASCULAR ATHERECTOMY Left 06/25/2019   Procedure: PERIPHERAL VASCULAR ATHERECTOMY;  Surgeon: Serafina Mitchell, MD;  Location: [facility];  Service: Cardiovascular;  Laterality: Left;  superficial femoral  .  PERIPHERAL VASCULAR BALLOON ANGIOPLASTY Left 10/22/2019   Procedure: PERIPHERAL VASCULAR BALLOON ANGIOPLASTY;  Surgeon: Serafina Mitchell, MD;  Location: [facility];  Service: Cardiovascular;  Laterality: Left;  superficial femoral  . RADIOLOGY WITH ANESTHESIA N/A 02/20/2019   Procedure: RADIOLOGY WITH ANESTHESIA   STENTING;  Surgeon: Luanne Bras, MD;  Location: [facility];  Service: Radiology;  Laterality: N/A;  . RADIOLOGY WITH ANESTHESIA N/A 03/25/2019   Procedure: RADIOLOGY WITH ANESTHESIA STENTING;  Surgeon: Luanne Bras,  MD;  Location: [facility];  Service: Radiology;  Laterality: N/A;  . SKIN SPLIT GRAFT Left 07/03/2019   Procedure: APPLY SKIN GRAFT LEFT FOOT;  Surgeon: Newt Minion, MD;  Location: [facility];  Service: Orthopedics;  Laterality: Left;  . TRACHEOSTOMY  11/13   closed 1/14  . TRANSRECTAL DRAINAGE OF PELVIC ABSCESS  12/11    Social History   Socioeconomic History  . Marital status: Single    Spouse name: Not on file  . Number of children: 0  . Years of education: 56  . Highest education level: Not on file  Occupational History  . Not on file  Tobacco Use  . Smoking status: Former Smoker    Packs/day: 0.60    Years: 23.00    Pack years: 13.80    Types: Cigarettes    Quit date: 02/05/2019    Years since quitting: 1.1  . Smokeless tobacco: Never Used  Vaping Use  . Vaping Use: Never used  Substance and Sexual Activity  . Alcohol use: No    Alcohol/week: 0.0 standard drinks  . Drug use: No  . Sexual activity: Not Currently    Birth control/protection: None  Other Topics Concern  . Not on file  Social History Narrative   She is single and does not have any children.    She has 2 yrs of college level education.   She has 5-6 caffeine drinks daily.    Left handed      Living with parents.   Social Determinants of Health   Financial Resource Strain:   . Difficulty of Paying Living Expenses: Not on file  Food Insecurity:   . Worried About Charity fundraiser in the Last Year: Not on file  . Ran Out of Food in the Last Year: Not on file  Transportation Needs:   . Lack of Transportation (Medical): Not on file  . Lack of Transportation (Non-Medical): Not on file  Physical Activity:   . Days of Exercise per Week: Not on file  . Minutes of Exercise per Session: Not on file  Stress:   . Feeling of Stress : Not on file  Social Connections:   . Frequency of Communication with Friends and Family: Not on file  . Frequency of Social Gatherings with Friends and Family: Not on file  .  Attends Religious Services: Not on file  . Active Member of Clubs or Organizations: Not on file  . Attends Archivist Meetings: Not on file  . Marital Status: Not on file  Intimate Partner Violence:   . Fear of Current or Ex-Partner: Not on file  . Emotionally Abused: Not on file  . Physically Abused: Not on file  . Sexually Abused: Not on file   Family History  Problem Relation Age of Onset  . Lung cancer Maternal Grandmother   . Heart attack Maternal Grandfather   . Diabetes Paternal Grandfather     Current Outpatient Medications  Medication Sig Dispense Refill  . aspirin  81 MG EC tablet Take 81 mg by mouth daily.     Marland Kitchen atorvastatin (LIPITOR) 40 MG tablet TAKE 1 TABLET BY MOUTH EVERY DAY 30 tablet 5  . blood glucose meter kit and supplies Dispense based on patient and insurance preference. Use up to four times daily as directed. (FOR ICD-10 E10.9, E11.9). 1 each 0  . clopidogrel (PLAVIX) 75 MG tablet Take 1 tablet (75 mg total) by mouth daily. 30 tablet 3  . dapagliflozin propanediol (FARXIGA) 10 MG TABS tablet Take 10 mg by mouth daily.     . diazepam (VALIUM) 5 MG tablet Take 5 mg by mouth 2 (two) times daily.     Marland Kitchen FLUoxetine (PROZAC) 10 MG capsule Take by mouth.    . gabapentin (NEURONTIN) 300 MG capsule TAKE THREE CAPSULES BY MOUTH THREE TIMES DAILY 410 capsule 3  . HYDROcodone-acetaminophen (NORCO/VICODIN) 5-325 MG tablet Take 1 tablet by mouth 2 (two) times daily. 20 tablet 0  . labetalol (NORMODYNE) 300 MG tablet Take 300 mg by mouth 2 (two) times daily.    Marland Kitchen levETIRAcetam (KEPPRA XR) 500 MG 24 hr tablet Take 500 mg by mouth 2 (two) times daily.     Marland Kitchen lisinopril (ZESTRIL) 40 MG tablet Take 40 mg by mouth daily.     . metFORMIN (GLUCOPHAGE) 500 MG tablet Take 500 mg by mouth 2 (two) times daily.    . nitroGLYCERIN (NITRODUR - DOSED IN MG/24 HR) 0.2 mg/hr patch Place 1 patch (0.2 mg total) onto the skin daily. Place patch on foot in different spot daily 30 patch 12    . omeprazole (PRILOSEC) 20 MG capsule Take 20 mg by mouth daily.    . risperiDONE (RISPERDAL) 0.5 MG tablet Take 0.5 mg by mouth at bedtime.    . silver sulfADIAZINE (SILVADENE) 1 % cream Apply 1 application topically daily. 400 g 0  . traZODone (DESYREL) 100 MG tablet Take 200 mg by mouth at bedtime.      No current facility-administered medications for this visit.    Allergies  Allergen Reactions  . Contrast Media [Iodinated Diagnostic Agents] Other (See Comments)    Went into kidney failure   . Amitriptyline Other (See Comments)    Unresponsive   . Compazine Other (See Comments)    Alert but unable to move  . Morphine And Related Other (See Comments)    Chest pain  . Oxycodone Nausea And Vomiting    Severe n/v -  patient will not take oxycodone or percocet.  . Promethazine Swelling    Lips swell  . Adhesive [Tape] Rash    Long term exposure causes skin to tear   . Sumatriptan Rash     REVIEW OF SYSTEMS:   '[X]'$  denotes positive finding, $RemoveBeforeDEI'[ ]'[id number]$  denotes negative finding Cardiac  Comments:  Chest pain or chest pressure:    Shortness of breath upon exertion:    Short of breath when lying flat:    Irregular heart rhythm:        Vascular    Pain in calf, thigh, or hip brought on by ambulation: x   Pain in feet at night that wakes you up from your sleep:  x   Blood clot in your veins:    Leg swelling:         Pulmonary    Oxygen at home:    Productive cough:     Wheezing:         Neurologic    Sudden weakness in arms  or legs:     Sudden numbness in arms or legs:     Sudden onset of difficulty speaking or slurred speech:    Temporary loss of vision in one eye:     Problems with dizziness:         Gastrointestinal    Blood in stool:     Vomited blood:         Genitourinary    Burning when urinating:     Blood in urine:        Psychiatric    Major depression:         Hematologic    Bleeding problems:    Problems with blood clotting too easily:        Skin     Rashes or ulcers:        Constitutional    Fever or chills:      PHYSICAL EXAMINATION:  Vitals:   03/27/20 1045  BP: 133/84  Pulse: 92  Resp: 20  Temp: 98.7 F (37.1 C)  TempSrc: Temporal  SpO2: 96%  Weight: 214 lb 12.8 oz (97.4 kg)  Height: $Remove'5\' 5"'ATWicpj$  (1.651 m)    General:  WDWN in NAD; vital signs documented above Gait: Unaided, no ataxia HENT: WNL, normocephalic Pulmonary: normal non-labored breathing , without Rales, rhonchi,  wheezing Cardiac: regular HR,  Skin: without rashes Vascular Exam/Pulses: 2+ femoral pulses bilaterally.  I cannot palpate pedal pulses. Extremities: without ischemic changes, without Gangrene , without cellulitis; without open wounds; her left amputation site is well-healed. Both feet are warm with intact sensation and motor function. Musculoskeletal: no muscle wasting or atrophy  Neurologic: A&O X 3;  No focal weakness or paresthesias are detected Psychiatric:  The pt has Normal affect.   Non-Invasive Vascular Imaging:   03/27/2020 [name]/TBIToday's ABIToday's TBIPrevious ABIPrevious TBI  +-------+-----------+-----------+------------+------------+  Right 0.74    0.56    0.48    0.40      +-------+-----------+-----------+------------+------------+  Left  0.59    0.44    0.94    0.60      Left lower extremity arterial duplex: focal velocity elevation of 585 cm/s was obtained at prx SFA with post  stenotic turbulence with a VR of 7.5. A 2nd focal velocity elevation was  visualized, measuring 324 cm/s at dst SFA with a VR of 3.09.    Summary:  Left: Velocities suggest a 75-99% stenosis in the proximal superficial  femoral artery and 50-74% stenosis in the distal superficial femoral  artery.    ASSESSMENT/PLAN:: 39 y.o. female here for follow up for peripheral arterial disease. The patient has left lower extremity rest pain with a decrease in her left [name]. Duplex reveals elevated velocity in the  proximal SFA of 585 cm/s. I discussed the case with Dr. Donzetta Matters who agrees on need for arteriography. I discussed the findings with the patient and her mother. They are in agreement with proceeding. I also discussed increasing the patient's narcotic pain medication prescription. She is currently using hydrocodone 5 mg every 8 hours. I will discontinue this prescription and prescribe oxycodone 10 mg every 4 hours as needed for pain. I asked that they try to reserve this regimen for nighttime as much as possible. Barbie Banner, PA-C [facility] [phone]  Clinic MD:   Donzetta Matters

## 2020-03-27 NOTE — Addendum Note (Signed)
Addended by: Barbie Banner on: 03/27/2020 02:21 PM   Modules accepted: Orders

## 2020-03-31 ENCOUNTER — Telehealth: Payer: Self-pay

## 2020-03-31 ENCOUNTER — Other Ambulatory Visit (HOSPITAL_COMMUNITY)
Admission: RE | Admit: 2020-03-31 | Discharge: 2020-03-31 | Disposition: A | Discharge status: Home / Self Care | Payer: Medicaid Other | Source: Ambulatory Visit | Attending: Vascular Surgery | Admitting: Vascular Surgery

## 2020-03-31 DIAGNOSIS — Z01812 Encounter for preprocedural laboratory examination: Secondary | ICD-10-CM | POA: Insufficient documentation

## 2020-03-31 DIAGNOSIS — Z20822 Contact with and (suspected) exposure to covid-19: Secondary | ICD-10-CM | POA: Insufficient documentation

## 2020-03-31 LAB — SARS CORONAVIRUS 2 (TAT 6-24 HRS): SARS Coronavirus 2: NEGATIVE

## 2020-03-31 MED ORDER — HYDROCODONE-ACETAMINOPHEN 10-325 MG PO TABS
1.0000 | ORAL_TABLET | ORAL | 0 refills | Status: DC | PRN
Start: 2020-03-31 — End: 2021-01-25

## 2020-03-31 NOTE — Telephone Encounter (Signed)
Patient's mother called to report pain meds not at pharmacy. Electronic RX system down on Friday. Confirmed meds were sent in today. She verbalized understanding.

## 2020-03-31 NOTE — Addendum Note (Signed)
Addended by: Barbie Banner on: 03/31/2020 11:20 AM   Modules accepted: Orders

## 2020-04-01 ENCOUNTER — Ambulatory Visit (HOSPITAL_COMMUNITY)
Admission: RE | Admit: 2020-04-01 | Discharge: 2020-04-01 | Disposition: A | Discharge status: Home / Self Care | Payer: Medicaid Other | Attending: Vascular Surgery | Admitting: Vascular Surgery

## 2020-04-01 ENCOUNTER — Other Ambulatory Visit: Payer: Self-pay

## 2020-04-01 ENCOUNTER — Encounter (HOSPITAL_COMMUNITY)
Admission: RE | Disposition: A | Discharge status: Home / Self Care | Payer: Self-pay | Source: Home / Self Care | Attending: Vascular Surgery

## 2020-04-01 DIAGNOSIS — Z87891 Personal history of nicotine dependence: Secondary | ICD-10-CM | POA: Diagnosis not present

## 2020-04-01 DIAGNOSIS — Z8673 Personal history of transient ischemic attack (TIA), and cerebral infarction without residual deficits: Secondary | ICD-10-CM | POA: Insufficient documentation

## 2020-04-01 DIAGNOSIS — I70222 Atherosclerosis of native arteries of extremities with rest pain, left leg: Secondary | ICD-10-CM | POA: Insufficient documentation

## 2020-04-01 DIAGNOSIS — Z7902 Long term (current) use of antithrombotics/antiplatelets: Secondary | ICD-10-CM | POA: Insufficient documentation

## 2020-04-01 DIAGNOSIS — Z885 Allergy status to narcotic agent status: Secondary | ICD-10-CM | POA: Insufficient documentation

## 2020-04-01 DIAGNOSIS — Z7982 Long term (current) use of aspirin: Secondary | ICD-10-CM | POA: Insufficient documentation

## 2020-04-01 DIAGNOSIS — K219 Gastro-esophageal reflux disease without esophagitis: Secondary | ICD-10-CM | POA: Insufficient documentation

## 2020-04-01 DIAGNOSIS — E1151 Type 2 diabetes mellitus with diabetic peripheral angiopathy without gangrene: Secondary | ICD-10-CM | POA: Insufficient documentation

## 2020-04-01 DIAGNOSIS — F419 Anxiety disorder, unspecified: Secondary | ICD-10-CM | POA: Insufficient documentation

## 2020-04-01 DIAGNOSIS — Z79899 Other long term (current) drug therapy: Secondary | ICD-10-CM | POA: Diagnosis not present

## 2020-04-01 DIAGNOSIS — F319 Bipolar disorder, unspecified: Secondary | ICD-10-CM | POA: Diagnosis not present

## 2020-04-01 DIAGNOSIS — Z7984 Long term (current) use of oral hypoglycemic drugs: Secondary | ICD-10-CM | POA: Diagnosis not present

## 2020-04-01 DIAGNOSIS — F329 Major depressive disorder, single episode, unspecified: Secondary | ICD-10-CM | POA: Insufficient documentation

## 2020-04-01 DIAGNOSIS — I10 Essential (primary) hypertension: Secondary | ICD-10-CM | POA: Diagnosis not present

## 2020-04-01 DIAGNOSIS — Z888 Allergy status to other drugs, medicaments and biological substances status: Secondary | ICD-10-CM | POA: Insufficient documentation

## 2020-04-01 HISTORY — PX: VASCULAR SURGERY: SHX849

## 2020-04-01 HISTORY — PX: ABDOMINAL AORTOGRAM W/LOWER EXTREMITY: CATH118223

## 2020-04-01 HISTORY — PX: PERIPHERAL VASCULAR INTERVENTION: CATH118257

## 2020-04-01 LAB — POCT I-STAT, CHEM 8
BUN: 13 mg/dL (ref 6–20)
Calcium, Ion: 1.23 mmol/L (ref 1.15–1.40)
Chloride: 104 mmol/L (ref 98–111)
Creatinine, Ser: 0.7 mg/dL (ref 0.44–1.00)
Glucose, Bld: 142 mg/dL — ABNORMAL HIGH (ref 70–99)
HCT: 30 % — ABNORMAL LOW (ref 36.0–46.0)
Hemoglobin: 10.2 g/dL — ABNORMAL LOW (ref 12.0–15.0)
Potassium: 3.8 mmol/L (ref 3.5–5.1)
Sodium: 140 mmol/L (ref 135–145)
TCO2: 24 mmol/L (ref 22–32)

## 2020-04-01 LAB — PREGNANCY, URINE: Preg Test, Ur: NEGATIVE

## 2020-04-01 LAB — POCT ACTIVATED CLOTTING TIME
Activated Clotting Time: 219 seconds
Activated Clotting Time: 169 seconds

## 2020-04-01 SURGERY — ABDOMINAL AORTOGRAM W/LOWER EXTREMITY
Anesthesia: LOCAL | Laterality: Left

## 2020-04-01 MED ORDER — HEPARIN (PORCINE) IN NACL 1000-0.9 UT/500ML-% IV SOLN
INTRAVENOUS | Status: AC
  Filled 2020-04-01: qty 1000

## 2020-04-01 MED ORDER — HEPARIN SODIUM (PORCINE) 1000 UNIT/ML IJ SOLN
INTRAMUSCULAR | Status: DC | PRN
  Administered 2020-04-01: 10000 [IU] via INTRAVENOUS

## 2020-04-01 MED ORDER — IODIXANOL 320 MG/ML IV SOLN
INTRAVENOUS | Status: DC | PRN
  Administered 2020-04-01: 170 mL via INTRA_ARTERIAL

## 2020-04-01 MED ORDER — LIDOCAINE HCL (PF) 1 % IJ SOLN
INTRAMUSCULAR | Status: AC
  Filled 2020-04-01: qty 30

## 2020-04-01 MED ORDER — HEPARIN (PORCINE) IN NACL 2000-0.9 UNIT/L-% IV SOLN
INTRAVENOUS | Status: AC
  Filled 2020-04-01: qty 1000

## 2020-04-01 MED ORDER — DIPHENHYDRAMINE HCL 50 MG/ML IJ SOLN
25.0000 mg | Freq: Once | INTRAMUSCULAR | Status: AC
  Administered 2020-04-01: 25 mg via INTRAVENOUS

## 2020-04-01 MED ORDER — SODIUM CHLORIDE 0.9% FLUSH: 3.0000 mL | Freq: Two times a day (BID) | INTRAVENOUS | Status: DC

## 2020-04-01 MED ORDER — FENTANYL CITRATE (PF) 100 MCG/2ML IJ SOLN
INTRAMUSCULAR | Status: AC
  Filled 2020-04-01: qty 2

## 2020-04-01 MED ORDER — FENTANYL CITRATE (PF) 100 MCG/2ML IJ SOLN
INTRAMUSCULAR | Status: DC | PRN
  Administered 2020-04-01 (×3): 25 ug via INTRAVENOUS

## 2020-04-01 MED ORDER — HYDROCODONE-ACETAMINOPHEN 5-325 MG PO TABS
ORAL_TABLET | ORAL | Status: AC
  Filled 2020-04-01: qty 1

## 2020-04-01 MED ORDER — SODIUM CHLORIDE 0.9% FLUSH: 3.0000 mL | INTRAVENOUS | Status: DC | PRN

## 2020-04-01 MED ORDER — HYDROCODONE-ACETAMINOPHEN 5-325 MG PO TABS
1.0000 | ORAL_TABLET | Freq: Once | ORAL | Status: AC
  Administered 2020-04-01: 1 via ORAL

## 2020-04-01 MED ORDER — MIDAZOLAM HCL 2 MG/2ML IJ SOLN
INTRAMUSCULAR | Status: AC
  Filled 2020-04-01: qty 2

## 2020-04-01 MED ORDER — SODIUM CHLORIDE 0.9 % IV SOLN: INTRAVENOUS | Status: DC

## 2020-04-01 MED ORDER — HYDROCODONE-ACETAMINOPHEN 10-325 MG PO TABS
1.0000 | ORAL_TABLET | ORAL | Status: DC | PRN
  Administered 2020-04-01: 1 via ORAL
  Filled 2020-04-01: qty 1

## 2020-04-01 MED ORDER — MIDAZOLAM HCL 2 MG/2ML IJ SOLN
INTRAMUSCULAR | Status: DC | PRN
  Administered 2020-04-01 (×3): 1 mg via INTRAVENOUS

## 2020-04-01 MED ORDER — SODIUM CHLORIDE 0.9 % WEIGHT BASED INFUSION: 1.0000 mL/kg/h | INTRAVENOUS | Status: DC

## 2020-04-01 MED ORDER — HYDRALAZINE HCL 20 MG/ML IJ SOLN: 5.0000 mg | INTRAMUSCULAR | Status: DC | PRN

## 2020-04-01 MED ORDER — ACETAMINOPHEN 325 MG PO TABS: 650.0000 mg | ORAL_TABLET | ORAL | Status: DC | PRN

## 2020-04-01 MED ORDER — HEPARIN (PORCINE) IN NACL 1000-0.9 UT/500ML-% IV SOLN
INTRAVENOUS | Status: DC | PRN
  Administered 2020-04-01 (×2): 500 mL

## 2020-04-01 MED ORDER — HEPARIN (PORCINE) IN NACL 1000-0.9 UT/500ML-% IV SOLN
INTRAVENOUS | Status: AC
  Filled 2020-04-01: qty 500

## 2020-04-01 MED ORDER — METHYLPREDNISOLONE SODIUM SUCC 125 MG IJ SOLR
INTRAMUSCULAR | Status: AC
  Administered 2020-04-01: 125 mg via INTRAVENOUS
  Filled 2020-04-01: qty 2

## 2020-04-01 MED ORDER — LABETALOL HCL 5 MG/ML IV SOLN: 10.0000 mg | INTRAVENOUS | Status: DC | PRN

## 2020-04-01 MED ORDER — HEPARIN SODIUM (PORCINE) 1000 UNIT/ML IJ SOLN
INTRAMUSCULAR | Status: AC
  Filled 2020-04-01: qty 1

## 2020-04-01 MED ORDER — SODIUM CHLORIDE 0.9 % IV SOLN: 250.0000 mL | INTRAVENOUS | Status: DC | PRN

## 2020-04-01 MED ORDER — DIPHENHYDRAMINE HCL 50 MG/ML IJ SOLN
INTRAMUSCULAR | Status: AC
  Filled 2020-04-01: qty 1

## 2020-04-01 MED ORDER — METHYLPREDNISOLONE SODIUM SUCC 125 MG IJ SOLR: 125.0000 mg | Freq: Once | INTRAMUSCULAR | Status: AC

## 2020-04-01 MED ORDER — LIDOCAINE HCL (PF) 1 % IJ SOLN
INTRAMUSCULAR | Status: DC | PRN
  Administered 2020-04-01: 12 mL

## 2020-04-01 MED ORDER — ONDANSETRON HCL 4 MG/2ML IJ SOLN: 4.0000 mg | Freq: Four times a day (QID) | INTRAMUSCULAR | Status: DC | PRN

## 2020-04-01 SURGICAL SUPPLY — 25 items
BALLN MUSTANG 5X120X135 (BALLOONS) ×3
BALLN STERLING OTW 4X220X150 (BALLOONS) ×3
BALLOON MUSTANG 5X120X135 (BALLOONS) IMPLANT
BALLOON STERLING OTW 4X220X150 (BALLOONS) IMPLANT
CATH OMNI FLUSH 5F 65CM (CATHETERS) ×1 IMPLANT
CATH QUICKCROSS SUPP .018X90CM (MICROCATHETER) ×1 IMPLANT
CATH TEMPO AQUA 5F 100CM (CATHETERS) ×1 IMPLANT
DCB RANGER 5.0X200 150 (BALLOONS) IMPLANT
DCB RANGER 5.0X60 135 (BALLOONS) IMPLANT
DEVICE CONTINUOUS FLUSH (MISCELLANEOUS) ×1 IMPLANT
GLIDEWIRE ADV .035X180CM (WIRE) ×1 IMPLANT
KIT ENCORE 26 ADVANTAGE (KITS) ×1 IMPLANT
KIT MICROPUNCTURE NIT STIFF (SHEATH) ×1 IMPLANT
KIT PV (KITS) ×3 IMPLANT
RANGER DCB 5.0X200 150 (BALLOONS) ×3
RANGER DCB 5.0X60 135 (BALLOONS) ×3
SHEATH FLEX ANSEL ST 6FR 45CM (SHEATH) ×1 IMPLANT
SHEATH PINNACLE 5F 10CM (SHEATH) ×1 IMPLANT
SHEATH PROBE COVER 6X72 (BAG) ×1 IMPLANT
STENT ELUVIA 6X120X130 (Permanent Stent) ×2 IMPLANT
SYR MEDRAD MARK V 150ML (SYRINGE) ×1 IMPLANT
TRANSDUCER W/STOPCOCK (MISCELLANEOUS) ×3 IMPLANT
TRAY PV CATH (CUSTOM PROCEDURE TRAY) ×3 IMPLANT
WIRE BENTSON .035X145CM (WIRE) ×1 IMPLANT
WIRE G V18X300CM (WIRE) ×1 IMPLANT

## 2020-04-01 NOTE — Progress Notes (Signed)
Up and walked and tolerated well; right groin stable, no bleeding or hematoma 

## 2020-04-01 NOTE — Progress Notes (Signed)
Site area: Right groin a 6 french arterial sheath was removed  Site Prior to Removal:  Level 0  Pressure Applied For 20 MINUTES    Bedrest Beginning at 1600p  Manual:   Yes.    Patient Status During Pull:  stable  Post Pull Groin Site:  Level 0  Post Pull Instructions Given:  Yes.    Post Pull Pulses Present:  Yes.    Dressing Applied:  Yes.    Comments:

## 2020-04-01 NOTE — Progress Notes (Addendum)
Client c/o 8/10 back pain and bilat leg pain and c/o headache; states " I do not want to stay in bed, I need to use the bathroom and I want to go home" client spoke to mother on the phone and her mother will be here at 47; Dr Trula Slade notified of client c/o pain and orders noted

## 2020-04-01 NOTE — Progress Notes (Signed)
Client refuses to lie still in bed; sitting up in bed and sitting on side of bed; right groin stable, no bleeding or hematoma

## 2020-04-01 NOTE — H&P (Signed)
History and Physical Interval Note:  04/01/2020 12:06 PM  Taylor [name]  has presented today for surgery, with the diagnosis of atherosclerosis  of native artery.  The various methods of treatment have been discussed with the patient and family. After consideration of risks, benefits and other options for treatment, the patient has consented to  Procedure(s): ABDOMINAL AORTOGRAM W/LOWER EXTREMITY (Left) as a surgical intervention.  The patient's history has been reviewed, patient examined, no change in status, stable for surgery.  I have reviewed the patient's chart and labs.  Questions were answered to the patient's satisfaction.    Left leg arteriogram.  Previous left SFA intervention with Dr. Trula Slade.  Recurrent SFA stenosis and rest pain.  [name]  Office Note     CC:  follow up Requesting Provider:  Monico Blitz, MD  HPI: Taylor [name] is a 39 y.o. ([birth year]) female who presents for 36-month follow-up peripheral arterial disease.  She underwent arteriogram with angioplasty of the left SFA on Oct 22, 2019 by Dr. Trula Slade.  An arteriogram performed on June 25, 2019 revealed 90% mid to superficial femoral artery lesion and an occluded short segment popliteal artery.  These were treated with atherectomy and drug-coated balloon angioplasty. 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 Sharol Given performed left fourth and fifth ray amputation.  Her amputation site healed completely.  Today the patient reports unrelenting rest pain that is crescendoed over the last month.  She states she is waking up every night now in pain.  She is compliant with Aspirin and Plavix. She stopped smoking in 2020.       Past Medical History:  Diagnosis Date  . Anxiety   . ARF (acute renal failure) ([place]) 2013  . Bipolar disorder ([place])   . Cerebral artery occlusion with cerebral infarction ([place]) 11/13   due to  elevated blood sugar 1700-off insulin 6 months  . Depression   . Diabetes mellitus    off insulin 6 months  . Diverticulitis   . Dyspnea    when walking  . GERD (gastroesophageal reflux disease)   . Headache(784.0)   . Hypertension   . Kidney dialysis status    on dialysis(stroke) off after 4-6 weeks  . Pneumonia 2013, 06/2018  . Respiratory failure ([place]) 11/13   with stroke - in hosp [facility] 12 weeks  . Seizures ([place])    during elvated blood sugsr episode 11/13  . Sepsis ([place])   . Stroke ([place])    11/2018, 12/2018 Weak left side, speech- slurred, Short term memeroy, Gait unsteady   . Vocal cord dysfunction          Past Surgical History:  Procedure Laterality Date  . ABDOMINAL AORTOGRAM W/LOWER EXTREMITY N/A 06/25/2019   Procedure: ABDOMINAL AORTOGRAM W/LOWER EXTREMITY;  Surgeon: Serafina Mitchell, MD;  Location: [facility];  Service: Cardiovascular;  Laterality: N/A;  . ABDOMINAL AORTOGRAM W/LOWER EXTREMITY N/A 10/22/2019   Procedure: ABDOMINAL AORTOGRAM W/LOWER EXTREMITY;  Surgeon: Serafina Mitchell, MD;  Location: [place] [facility];  Service: Cardiovascular;  Laterality: N/A;  . AMPUTATION Left 07/03/2019   Procedure: LEFT FOOT 4TH AND 5TH RAY AMPUTATION;  Surgeon: Newt Minion, MD;  Location: [facility];  Service: Orthopedics;  Laterality: Left;  . APPENDECTOMY    . INSERTION OF DIALYSIS CATHETER  11/13   removed in 4-6 weeks  . IR ANGIO INTRA EXTRACRAN SEL COM CAROTID INNOMINATE  BILAT MOD SED  02/07/2019  . IR ANGIO VERTEBRAL SEL VERTEBRAL BILAT MOD SED  02/07/2019  . IR ANGIOGRAM EXTREMITY LEFT  02/07/2019  . IR ANGIOGRAM EXTREMITY RIGHT  02/20/2019  . IR TRANSCATH EXCRAN VERT OR CAR A STENT  02/20/2019  . IR TRANSCATH EXCRAN VERT OR CAR A STENT  03/25/2019  . IR US GUIDE VASC ACCESS RIGHT  02/07/2019  . MULTIPLE EXTRACTIONS WITH ALVEOLOPLASTY N/A 02/24/2014   Procedure: MULTIPLE EXTRACTION WITH ALVEOLOPLASTY AND BIOPSY;  Surgeon: Georgia Lopes,  DDS;  Location: [facility];  Service: Oral Surgery;  Laterality: N/A;  . PERIPHERAL VASCULAR ATHERECTOMY Left 06/25/2019   Procedure: PERIPHERAL VASCULAR ATHERECTOMY;  Surgeon: Nada Libman, MD;  Location: [facility];  Service: Cardiovascular;  Laterality: Left;  superficial femoral  . PERIPHERAL VASCULAR BALLOON ANGIOPLASTY Left 10/22/2019   Procedure: PERIPHERAL VASCULAR BALLOON ANGIOPLASTY;  Surgeon: Nada Libman, MD;  [facility];  Service: Cardiovascular;  Laterality: Left;  superficial femoral  . RADIOLOGY WITH ANESTHESIA N/A 02/20/2019   Procedure: RADIOLOGY WITH ANESTHESIA   STENTING;  Surgeon: Julieanne Cotton, MD;  Location: [facility];  Service: Radiology;  Laterality: N/A;  . RADIOLOGY WITH ANESTHESIA N/A 03/25/2019   Procedure: RADIOLOGY WITH ANESTHESIA STENTING;  Surgeon: Julieanne Cotton, MD;  Location: [facility];  Service: Radiology;  Laterality: N/A;  . SKIN SPLIT GRAFT Left 07/03/2019   Procedure: APPLY SKIN GRAFT LEFT FOOT;  Surgeon: Nadara Mustard, MD;  Location: [facility];  Service: Orthopedics;  Laterality: Left;  . TRACHEOSTOMY  11/13   closed 1/14  . TRANSRECTAL DRAINAGE OF PELVIC ABSCESS  12/11    Social History        Socioeconomic History  . Marital status: Single    Spouse name: Not on file  . Number of children: 0  . Years of education: 58  . Highest education level: Not on file  Occupational History  . Not on file  Tobacco Use  . Smoking status: Former Smoker    Packs/day: 0.60    Years: 23.00    Pack years: 13.80    Types: Cigarettes    Quit date: 02/05/2019    Years since quitting: 1.1  . Smokeless tobacco: Never Used  Vaping Use  . Vaping Use: Never used  Substance and Sexual Activity  . Alcohol use: No    Alcohol/week: 0.0 standard drinks  . Drug use: No  . Sexual activity: Not Currently    Birth control/protection: None  Other Topics Concern  . Not on file  Social History Narrative   She is  single and does not have any children.    She has 2 yrs of college level education.   She has 5-6 caffeine drinks daily.    Left handed      Living with parents.   Social Determinants of Health      Financial Resource Strain:   . Difficulty of Paying Living Expenses: Not on file  Food Insecurity:   . Worried About Programme researcher, broadcasting/film/video in the Last Year: Not on file  . Ran Out of Food in the Last Year: Not on file  Transportation Needs:   . Lack of Transportation (Medical): Not on file  . Lack of Transportation (Non-Medical): Not on file  Physical Activity:   . Days of Exercise per Week: Not on file  . Minutes of Exercise per Session: Not on file  Stress:   . Feeling of Stress : Not on file  Social Connections:   . Frequency of Communication with Friends and Family: Not on file  . Frequency of Social Gatherings with Friends and Family: Not on file  . Attends Religious Services: Not on file  . Active Member of Clubs or Organizations: Not on file  . Attends Archivist Meetings: Not on file  . Marital Status: Not on file  Intimate Partner Violence:   . Fear of Current or Ex-Partner: Not on file  . Emotionally Abused: Not on file  . Physically Abused: Not on file  . Sexually Abused: Not on file        Family History  Problem Relation Age of Onset  . Lung cancer Maternal Grandmother   . Heart attack Maternal Grandfather   . Diabetes Paternal Grandfather           Current Outpatient Medications  Medication Sig Dispense Refill  . aspirin 81 MG EC tablet Take 81 mg by mouth daily.     Marland Kitchen atorvastatin (LIPITOR) 40 MG tablet TAKE 1 TABLET BY MOUTH EVERY DAY 30 tablet 5  . blood glucose meter kit and supplies Dispense based on patient and insurance preference. Use up to four times daily as directed. (FOR ICD-10 E10.9, E11.9). 1 each 0  . clopidogrel (PLAVIX) 75 MG tablet Take 1 tablet (75 mg total) by mouth daily. 30 tablet 3  . dapagliflozin propanediol  (FARXIGA) 10 MG TABS tablet Take 10 mg by mouth daily.     . diazepam (VALIUM) 5 MG tablet Take 5 mg by mouth 2 (two) times daily.     Marland Kitchen FLUoxetine (PROZAC) 10 MG capsule Take by mouth.    . gabapentin (NEURONTIN) 300 MG capsule TAKE THREE CAPSULES BY MOUTH THREE TIMES DAILY 410 capsule 3  . HYDROcodone-acetaminophen (NORCO/VICODIN) 5-325 MG tablet Take 1 tablet by mouth 2 (two) times daily. 20 tablet 0  . labetalol (NORMODYNE) 300 MG tablet Take 300 mg by mouth 2 (two) times daily.    Marland Kitchen levETIRAcetam (KEPPRA XR) 500 MG 24 hr tablet Take 500 mg by mouth 2 (two) times daily.     Marland Kitchen lisinopril (ZESTRIL) 40 MG tablet Take 40 mg by mouth daily.     . metFORMIN (GLUCOPHAGE) 500 MG tablet Take 500 mg by mouth 2 (two) times daily.    . nitroGLYCERIN (NITRODUR - DOSED IN MG/24 HR) 0.2 mg/hr patch Place 1 patch (0.2 mg total) onto the skin daily. Place patch on foot in different spot daily 30 patch 12  . omeprazole (PRILOSEC) 20 MG capsule Take 20 mg by mouth daily.    . risperiDONE (RISPERDAL) 0.5 MG tablet Take 0.5 mg by mouth at bedtime.    . silver sulfADIAZINE (SILVADENE) 1 % cream Apply 1 application topically daily. 400 g 0  . traZODone (DESYREL) 100 MG tablet Take 200 mg by mouth at bedtime.      No current facility-administered medications for this visit.         Allergies  Allergen Reactions  . Contrast Media [Iodinated Diagnostic Agents] Other (See Comments)    Went into kidney failure   . Amitriptyline Other (See Comments)    Unresponsive   . Compazine Other (See Comments)    Alert but unable to move  . Morphine And Related Other (See Comments)    Chest pain  . Oxycodone Nausea And Vomiting    Severe n/v -  patient will not take oxycodone or percocet.  . Promethazine Swelling    Lips swell  .  Adhesive [Tape] Rash    Long term exposure causes skin to tear   . Sumatriptan Rash     REVIEW OF SYSTEMS:   '[X]'$  denotes positive finding, $RemoveBeforeDEI'[ ]'[id number]$   denotes negative finding Cardiac  Comments:  Chest pain or chest pressure:    Shortness of breath upon exertion:    Short of breath when lying flat:    Irregular heart rhythm:        Vascular    Pain in calf, thigh, or hip brought on by ambulation: x   Pain in feet at night that wakes you up from your sleep:  x   Blood clot in your veins:    Leg swelling:         Pulmonary    Oxygen at home:    Productive cough:     Wheezing:         Neurologic    Sudden weakness in arms or legs:     Sudden numbness in arms or legs:     Sudden onset of difficulty speaking or slurred speech:    Temporary loss of vision in one eye:     Problems with dizziness:         Gastrointestinal    Blood in stool:     Vomited blood:         Genitourinary    Burning when urinating:     Blood in urine:        Psychiatric    Major depression:         Hematologic    Bleeding problems:    Problems with blood clotting too easily:        Skin    Rashes or ulcers:        Constitutional    Fever or chills:      PHYSICAL EXAMINATION:     Vitals:   03/27/20 1045  BP: 133/84  Pulse: 92  Resp: 20  Temp: 98.7 F (37.1 C)  TempSrc: Temporal  SpO2: 96%  Weight: 214 lb 12.8 oz (97.4 kg)  Height: $Remove'5\' 5"'YHdEmWg$  (1.651 m)    General:  WDWN in NAD; vital signs documented above Gait: Unaided, no ataxia HENT: WNL, normocephalic Pulmonary: normal non-labored breathing , without Rales, rhonchi,  wheezing Cardiac: regular HR,  Skin: without rashes Vascular Exam/Pulses: 2+ femoral pulses bilaterally.  I cannot palpate pedal pulses. Extremities: without ischemic changes, without Gangrene , without cellulitis; without open wounds; her left amputation site is well-healed. Both feet are warm with intact sensation and motor function. Musculoskeletal: no muscle wasting or atrophy       Neurologic: A&O X 3;  No focal  weakness or paresthesias are detected Psychiatric:  The pt has Normal affect.   Non-Invasive Vascular Imaging:   03/27/2020 [name]/TBIToday's ABIToday's TBIPrevious ABIPrevious TBI  +-------+-----------+-----------+------------+------------+  Right 0.74    0.56    0.48    0.40      +-------+-----------+-----------+------------+------------+  Left  0.59    0.44    0.94    0.60      Left lower extremity arterial duplex: focal velocity elevation of 585 cm/s was obtained at prx SFA with post  stenotic turbulence with a VR of 7.5. A 2nd focal velocity elevation was  visualized, measuring 324 cm/s at dst SFA with a VR of 3.09.    Summary:  Left: Velocities suggest a 75-99% stenosis in the proximal superficial  femoral artery and 50-74% stenosis in the distal superficial femoral  artery.  ASSESSMENT/PLAN:: 39 y.o. female here for follow up for peripheral arterial disease. The patient has left lower extremity rest pain with a decrease in her left [name]. Duplex reveals elevated velocity in the proximal SFA of 585 cm/s. I discussed the case with Dr. Donzetta Matters who agrees on need for arteriography. I discussed the findings with the patient and her mother. They are in agreement with proceeding. I also discussed increasing the patient's narcotic pain medication prescription. She is currently using hydrocodone 5 mg every 8 hours. I will discontinue this prescription and prescribe oxycodone 10 mg every 4 hours as needed for pain. I asked that they try to reserve this regimen for nighttime as much as possible. Barbie Banner, PA-C [facility] [phone]  Clinic MD:   Donzetta Matters

## 2020-04-01 NOTE — Discharge Instructions (Signed)
NO METFORMIN/ GLUCOPHAGE FOR 2 DAYS  Femoral Site Care This sheet gives you information about how to care for yourself after your procedure. Your health care provider may also give you more specific instructions. If you have problems or questions, contact your health care provider. What can I expect after the procedure? After the procedure, it is common to have:  Bruising that usually fades within 1-2 weeks.  Tenderness at the site. Follow these instructions at home: Wound care  Follow instructions from your health care provider about how to take care of your insertion site. Make sure you: ? Wash your hands with soap and water before you change your bandage (dressing). If soap and water are not available, use hand sanitizer. ? Change your dressing as told by your health care provider. ? Leave stitches (sutures), skin glue, or adhesive strips in place. These skin closures may need to stay in place for 2 weeks or longer. If adhesive strip edges start to loosen and curl up, you may trim the loose edges. Do not remove adhesive strips completely unless your health care provider tells you to do that.  Do not take baths, swim, or use a hot tub until your health care provider approves.  You may shower 24-48 hours after the procedure or as told by your health care provider. ? Gently wash the site with plain soap and water. ? Pat the area dry with a clean towel. ? Do not rub the site. This may cause bleeding.  Do not apply powder or lotion to the site. Keep the site clean and dry.  Check your femoral site every day for signs of infection. Check for: ? Redness, swelling, or pain. ? Fluid or blood. ? Warmth. ? Pus or a bad smell. Activity  For the first 2-3 days after your procedure, or as long as directed: ? Avoid climbing stairs as much as possible. ? Do not squat.  Do not lift anything that is heavier than 10 lb (4.5 kg), or the limit that you are told, until your health care provider  says that it is safe.  Rest as directed. ? Avoid sitting for a long time without moving. Get up to take short walks every 1-2 hours.  Do not drive for 24 hours if you were given a medicine to help you relax (sedative). General instructions  Take over-the-counter and prescription medicines only as told by your health care provider.  Keep all follow-up visits as told by your health care provider. This is important. Contact a health care provider if you have:  A fever or chills.  You have redness, swelling, or pain around your insertion site. Get help right away if:  The catheter insertion area swells very fast.  You pass out.  You suddenly start to sweat or your skin gets clammy.  The catheter insertion area is bleeding, and the bleeding does not stop when you hold steady pressure on the area.  The area near or just beyond the catheter insertion site becomes pale, cool, tingly, or numb. These symptoms may represent a serious problem that is an emergency. Do not wait to see if the symptoms will go away. Get medical help right away. Call your local emergency services (911 in the [place] Do not drive yourself to the hospital. Summary  After the procedure, it is common to have bruising that usually fades within 1-2 weeks.  Check your femoral site every day for signs of infection.  Do not lift anything that is heavier   than 10 lb (4.5 kg), or the limit that you are told, until your health care provider says that it is safe. This information is not intended to replace advice given to you by your health care provider. Make sure you discuss any questions you have with your health care provider. Document Revised: 06/12/2017 Document Reviewed: 06/12/2017 Elsevier Patient Education  2020 Elsevier Inc[employer]  

## 2020-04-01 NOTE — Op Note (Signed)
Patient name: Taylor [name] MRN: [id number] DOB: 1981-04-14 Sex: female  04/01/2020 Pre-operative Diagnosis: Critical limb ischemia of the left lower extremity with rest pain Post-operative diagnosis:  Same Surgeon:  [name], MD Procedure Performed: 1.  Ultrasound-guided access of right common femoral artery 2.  Aortogram including catheter selection of aorta 3.  Bilateral lower extremity arteriogram with selection of third order branches in the left lower extremity 4.  Left SFA and above-knee popliteal angioplasty including drug-coated balloon (4 mm Sterling and 5 mm drug-coated Ranger) 5.  Left SFA and above-knee popliteal stent placement (6 mm x 120 mm drug-coated Eluvia x2) 6.  75 minutes of monitored moderate conscious sedation time  Indications: 39 year old female who has undergone multiple left leg interventions by Dr. Trula [name] for critical limb ischemia with rest pain.  She was recently seen in clinic with evidence of recurrent stenosis in the left SFA after previous atherectomy as well as drug-coated balloon angioplasty earlier this year.  [name]'s were 0.59 and monophasic at the ankle.  She presents after risk benefits discussed.  Findings:   Aortogram showed no flow-limiting stenosis in the aortoiliac segment.  Left lower extremity arteriogram showed diffusely diseased SFA throughout the mid distal segment into the above-knee popliteal artery with multiple high-grade stenosis greater than 90% over approximate 200 mm segment.  This entire segment was ultimately crossed and then initially treated with a 4 mm Sterling and then 5 mm drug-coated Ranger.  Unfortunately there was dissection after we tried to use a larger balloon and elected to place two 6 mm drug-coated Olivia stents in the SFA above-knee popliteal artery.  She has no evidence residual stenosis with three-vessel runoff in the left lower extremity.  Right lower extremity arteriogram shows diffusely diseased SFA  as well with patent popliteal and three-vessel runoff.   Procedure:  The patient was identified in the holding area and taken to room 8.  The patient was then placed supine on the table and prepped and draped in the usual sterile fashion.  A time out was called.  Ultrasound was used to evaluate the right common femoral artery.  It was patent .  A digital ultrasound image was acquired.  A micropuncture needle was used to access the right common femoral artery under ultrasound guidance.  An 018 wire was advanced without resistance and a micropuncture sheath was placed.  The 018 wire was removed and a benson wire was placed.  The micropuncture sheath was exchanged for a 5 french sheath.  An omniflush catheter was advanced over the wire to the level of L-1.  An abdominal angiogram was obtained.  Next the Omni Flush catheter was pulled down and bilateral lower extremity runoff was obtained.  After evaluating imaging elected to intervene on the multifocal high-grade long segment recurrent stenosis in the left SFA above-knee popliteal artery.  Used a Glidewire advantage to select the left SFA and ultimately placed along 6 [place] Ansell sheath in the right groin over the aortic bifurcation.  Patient was given 100 units per kilogram heparin.  Ultimately I initially tried to use a V 18 to get down the left SFA but I kept cannulating branches and so used the Glidewire advantage with a straight support catheter to cross the left SFA lesions.  Once we crossed the lesions, confirmed on hand injection in  the popliteal artery that we were in the true lumen.  I then put a V 18 wire back down.  Initially treateded the mid to  distal SFA above-knee popliteal artery with a long 4 mm x 220 mm Sterling to nominal pressure for 2 minutes.  There was no dissection but the vessel looked very small compared to the native popliteal that was less diseased.  We elected to upsize and use a long 5 mm x 200 mm and then 5 mm x 60 mm drug-coated  Ranger to treat this entire segment for 3 minutes after inflation.  Unfortunately after this there was a dissection in the mid segment into the distal segment of the SFA.  I then treated this with a 6 mm x 120 mm drug coated Eluvia x2 and post dilated this with a 5 mm balloon.  No evidence of residual stenosis in the SFA with brisk flow down the left leg and three-vessel runoff.  That point in time wires and catheters were removed.  I did get a right femoral access shot and elected to take her to holding for manual pressure.  She remained stable throughout the case.  Plan: Patient will continue dual antiplatelet therapy.  Will arrange follow-up in 1 month with left leg arterial duplex and ABIs.   [name], MD [facility] [place] Office: [phone]

## 2020-04-02 ENCOUNTER — Encounter (HOSPITAL_COMMUNITY): Payer: Self-pay | Admitting: Vascular Surgery

## 2020-04-17 ENCOUNTER — Other Ambulatory Visit: Payer: Self-pay

## 2020-04-17 DIAGNOSIS — I739 Peripheral vascular disease, unspecified: Secondary | ICD-10-CM

## 2020-04-17 DIAGNOSIS — I70245 Atherosclerosis of native arteries of left leg with ulceration of other part of foot: Secondary | ICD-10-CM

## 2020-04-23 ENCOUNTER — Other Ambulatory Visit: Payer: Self-pay | Admitting: Neurology

## 2020-04-24 ENCOUNTER — Other Ambulatory Visit: Payer: Self-pay

## 2020-04-24 ENCOUNTER — Telehealth (INDEPENDENT_AMBULATORY_CARE_PROVIDER_SITE_OTHER): Payer: Medicaid Other | Admitting: Neurology

## 2020-04-24 ENCOUNTER — Encounter: Payer: Self-pay | Admitting: Neurology

## 2020-04-24 VITALS — Wt 217.0 lb

## 2020-04-24 DIAGNOSIS — I6522 Occlusion and stenosis of left carotid artery: Secondary | ICD-10-CM | POA: Diagnosis not present

## 2020-04-24 DIAGNOSIS — G890 Central pain syndrome: Secondary | ICD-10-CM | POA: Diagnosis not present

## 2020-04-24 DIAGNOSIS — I639 Cerebral infarction, unspecified: Secondary | ICD-10-CM | POA: Diagnosis not present

## 2020-04-24 MED ORDER — GABAPENTIN 300 MG PO CAPS
1200.0000 mg | ORAL_CAPSULE | Freq: Three times a day (TID) | ORAL | 3 refills | Status: DC
Start: 2020-04-24 — End: 2021-04-20

## 2020-04-24 MED ORDER — ATORVASTATIN CALCIUM 40 MG PO TABS
40.0000 mg | ORAL_TABLET | Freq: Every day | ORAL | 5 refills | Status: DC
Start: 2020-04-24 — End: 2020-10-20

## 2020-04-24 NOTE — Progress Notes (Signed)
   Virtual Visit via Video Note The purpose of this virtual visit is to provide medical care while limiting exposure to the novel coronavirus.    Consent was obtained for video visit:  Yes.   Answered questions that patient had about telehealth interaction:  Yes.   I discussed the limitations, risks, security and privacy concerns of performing an evaluation and management service by telemedicine. I also discussed with the patient that there may be a patient responsible charge related to this service. The patient expressed understanding and agreed to proceed.  Pt location: Home Physician Location: office Name of referring provider:  Monico Blitz, MD I connected with Taylor [name] at patients initiation/request on 04/24/2020 at  1:30 PM EST by video enabled telemedicine application and verified that I am speaking with the correct person using two identifiers. Pt MRN:  [id number] Pt DOB:  03-25-1981 Video Participants:  Taylor [name]   History of Present Illness: This is a 39 y.o. 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 Since her last visit, she underwent left SFA and popliteal angioplasty for critical limb ischemia.  Fortunately, she has not had any new neurological symptoms.  She continues to have severe tingling on the left side of her body and would like to increase gabapentin to 1200mg  TID.  Observations/Objective:   Vitals:   04/24/20 1259  Weight: 217 lb (98.4 kg)   Patient is awake, alert, and appears comfortable.  Oriented x 4.   Extraocular muscles are intact. No ptosis.  Face is symmetric.  Speech is not dysarthric.  Antigravity in all extremities.  No pronator drift. Gait not tested    Lab Results  Component Value Date   HGBA1C 10.0 (H) 06/23/2019   Lab Results  Component Value Date   CHOL 119 01/24/2019   HDL 30 (L) 01/24/2019   LDLCALC 35  01/24/2019   TRIG 270 (H) 01/24/2019   CHOLHDL 4.0 01/24/2019     Assessment and Plan:  Intracranial stenosis with history of multiple strokes (R PCA and bithalamic stroke 2013, left thalamic stroke 12/2018, and left paramedial pontine stroke 11/2018).  She is s/p left vertebral artery stenting. She has known LICA stenosis, left vertebrobasilar stneosis, and cavernous segment of RICA stenosis.  Risk factors include poorly controlled diabetes mellitus, hypertriglyceridemia.  BP is well-controlled.  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    - Continue aspirin 81mg  + plavix 75mg  daily  - Continue lipitor 40mg  daily  - increase gabapentin to 1200mg  TID  Seizure disorder, stable on Keppra 500mg  BID  Follow Up Instructions:   I discussed the assessment and treatment plan with the patient. The patient was provided an opportunity to ask questions and all were answered. The patient agreed with the plan and demonstrated an understanding of the instructions.   The patient was advised to call back or seek an in-person evaluation if the symptoms worsen or if the condition fails to improve as anticipated.  Follow-up in 9 months   [place], DO

## 2020-04-28 ENCOUNTER — Ambulatory Visit (INDEPENDENT_AMBULATORY_CARE_PROVIDER_SITE_OTHER)
Admission: RE | Admit: 2020-04-28 | Discharge: 2020-04-28 | Disposition: A | Discharge status: Home / Self Care | Payer: Medicaid Other | Source: Ambulatory Visit | Attending: Physician Assistant | Admitting: Physician Assistant

## 2020-04-28 ENCOUNTER — Ambulatory Visit (INDEPENDENT_AMBULATORY_CARE_PROVIDER_SITE_OTHER): Payer: Medicaid Other | Admitting: Physician Assistant

## 2020-04-28 ENCOUNTER — Ambulatory Visit (HOSPITAL_COMMUNITY)
Admission: RE | Admit: 2020-04-28 | Discharge: 2020-04-28 | Disposition: A | Discharge status: Home / Self Care | Payer: Medicaid Other | Source: Ambulatory Visit | Attending: Physician Assistant | Admitting: Physician Assistant

## 2020-04-28 ENCOUNTER — Other Ambulatory Visit: Payer: Self-pay

## 2020-04-28 VITALS — BP 103/63 | HR 91 | Temp 98.3°F | Resp 20 | Ht 65.0 in | Wt 212.9 lb

## 2020-04-28 DIAGNOSIS — I739 Peripheral vascular disease, unspecified: Secondary | ICD-10-CM

## 2020-04-28 DIAGNOSIS — I70245 Atherosclerosis of native arteries of left leg with ulceration of other part of foot: Secondary | ICD-10-CM | POA: Insufficient documentation

## 2020-04-28 DIAGNOSIS — I70222 Atherosclerosis of native arteries of extremities with rest pain, left leg: Secondary | ICD-10-CM | POA: Diagnosis not present

## 2020-04-28 NOTE — Progress Notes (Signed)
Office Note     CC:  follow up Requesting Provider:  Monico Blitz, MD  HPI: Taylor [name] is a 39 y.o. (1981-02-15) female who presents for follow up of peripheral artery disease. She has undergone multiple left lower extremity interventions this year. She most recently had Abdominal Aortogram with bilateral lower extremity runoff with left SFA and AK pop DCB angioplasty and stenting by Dr. Carlis Abbott on 04/01/20 due to rest pain.   Patient is a poor historian secondary to CVA, but she states that following the intervention her left leg improved for a couple weeks but now she is having recurrent rest pain. She says it feels the same as it did prior to the procedure. She describes pain waking her up from sleep and hurting a lot in the left distal leg.Her mother who is present says that she wakes up in middle of the night crying due to the pain. She says she does have some discomfort in the right foot as well but not as severe as left leg. She does not describe any claudication and she does not have any tissue loss or non healing wounds. She does report some swelling in her legs and says that her calfs often are tender and that she can see her veins "bulging". She elevates her legs and this helps regarding the swelling.  She does have mid-lower back pain and was just seen by Ortho 2-3 weeks ago and was diagnosed with a bulging disc. She is currently taking Gabapentin 1200 mg daily as well as Norco every 4 hours PRN. She says this is minimally helping   The pt is on a statin for cholesterol management.  The pt is on a daily aspirin.   Other AC:  Plavix The pt is on ACE and BB for hypertension.   The pt is diabetic.  Tobacco hx: former, quit 2020  Past Medical History:  Diagnosis Date  . Anxiety   . ARF (acute renal failure) ([place]) 2013  . Bipolar disorder ([place])   . Cerebral artery occlusion with cerebral infarction ([place]) 11/13   due to elevated blood sugar 1700-off insulin 6 months  . Depression     . Diabetes mellitus    off insulin 6 months  . Diverticulitis   . Dyspnea    when walking  . GERD (gastroesophageal reflux disease)   . Headache(784.0)   . Hypertension   . Kidney dialysis status    on dialysis(stroke) off after 4-6 weeks  . Pneumonia 2013, 06/2018  . Respiratory failure ([place]) 11/13   with stroke - in hosp [facility] 12 weeks  . Seizures ([place])    during elvated blood sugsr episode 11/13  . Sepsis ([place])   . Stroke ([place])    11/2018, 12/2018 Weak left side, speech- slurred, Short term memeroy, Gait unsteady   . Vocal cord dysfunction     Past Surgical History:  Procedure Laterality Date  . ABDOMINAL AORTOGRAM W/LOWER EXTREMITY N/A 06/25/2019   Procedure: ABDOMINAL AORTOGRAM W/LOWER EXTREMITY;  Surgeon: Serafina Mitchell, MD;  Location: [facility];  Service: Cardiovascular;  Laterality: N/A;  . ABDOMINAL AORTOGRAM W/LOWER EXTREMITY N/A 10/22/2019   Procedure: ABDOMINAL AORTOGRAM W/LOWER EXTREMITY;  Surgeon: Serafina Mitchell, MD;  Location: [facility];  Service: Cardiovascular;  Laterality: N/A;  . ABDOMINAL AORTOGRAM W/LOWER EXTREMITY Left 04/01/2020   Procedure: ABDOMINAL AORTOGRAM W/LOWER EXTREMITY;  Surgeon: [name], MD;  Location: [facility];  Service: Cardiovascular;  Laterality: Left;  .  AMPUTATION Left 07/03/2019   Procedure: LEFT FOOT 4TH AND 5TH RAY AMPUTATION;  Surgeon: Nadara Mustard, MD;  Location[facility] OR;  Service: Orthopedics;  Laterality: Left;  . APPENDECTOMY    . INSERTION OF DIALYSIS CATHETER  11/13   removed in 4-6 weeks  . IR ANGIO INTRA EXTRACRAN SEL COM CAROTID INNOMINATE BILAT MOD SED  02/07/2019  . IR ANGIO VERTEBRAL SEL VERTEBRAL BILAT MOD SED  02/07/2019  . IR ANGIOGRAM EXTREMITY LEFT  02/07/2019  . IR ANGIOGRAM EXTREMITY RIGHT  02/20/2019  . IR TRANSCATH EXCRAN VERT OR CAR A STENT  02/20/2019  . IR TRANSCATH EXCRAN VERT OR CAR A STENT  03/25/2019  . IR US GUIDE VASC ACCESS RIGHT  02/07/2019  . MULTIPLE EXTRACTIONS WITH  ALVEOLOPLASTY N/A 02/24/2014   Procedure: MULTIPLE EXTRACTION WITH ALVEOLOPLASTY AND BIOPSY;  Surgeon: Georgia Lopes, DDS;  Location: [facility] OR;  Service: Oral Surgery;  Laterality: N/A;  . PERIPHERAL VASCULAR ATHERECTOMY Left 06/25/2019   Procedure: PERIPHERAL VASCULAR ATHERECTOMY;  Surgeon: Nada Libman, MD;  Location: [facility];  Service: Cardiovascular;  Laterality: Left;  superficial femoral  . PERIPHERAL VASCULAR BALLOON ANGIOPLASTY Left 10/22/2019   Procedure: PERIPHERAL VASCULAR BALLOON ANGIOPLASTY;  Surgeon: Nada Libman, MD;  Location: [facility];  Service: Cardiovascular;  Laterality: Left;  superficial femoral  . PERIPHERAL VASCULAR INTERVENTION  04/01/2020   Procedure: PERIPHERAL VASCULAR INTERVENTION;  Surgeon: Cephus Shelling, MD;  [facility]: [facility];  Service: Cardiovascular;;  left SFA stent   . RADIOLOGY WITH ANESTHESIA N/A 02/20/2019   Procedure: RADIOLOGY WITH ANESTHESIA   STENTING;  Surgeon: Julieanne Cotton, MD;  Location: [facility];  Service: Radiology;  Laterality: N/A;  . RADIOLOGY WITH ANESTHESIA N/A 03/25/2019   Procedure: RADIOLOGY WITH ANESTHESIA STENTING;  Surgeon: Julieanne Cotton, MD;  Location: [facility];  Service: Radiology;  Laterality: N/A;  . SKIN SPLIT GRAFT Left 07/03/2019   Procedure: APPLY SKIN GRAFT LEFT FOOT;  Surgeon: Nadara Mustard, MD;  Location: [facility] OR;  Service: Orthopedics;  Laterality: Left;  . TRACHEOSTOMY  11/13   closed 1/14  . TRANSRECTAL DRAINAGE OF PELVIC ABSCESS  12/11  . VASCULAR SURGERY  04/01/2020   two stents placed in left leg    Social History   Socioeconomic History  . Marital status: Single    Spouse name: Not on file  . Number of children: 0  . Years of education: 39  . Highest education level: Not on file  Occupational History  . Not on file  Tobacco Use  . Smoking status: Former Smoker    Packs/day: 0.60    Years: 23.00    Pack years: 13.80    Types: Cigarettes    Quit date: 02/05/2019      Years since quitting: 1.2  . Smokeless tobacco: Never Used  Vaping Use  . Vaping Use: Never used  Substance and Sexual Activity  . Alcohol use: No    Alcohol/week: 0.0 standard drinks  . Drug use: No  . Sexual activity: Not Currently    Birth control/protection: None  Other Topics Concern  . Not on file  Social History Narrative   She is single and does not have any children.    She has 2 yrs of college level education.   She has 5-6 caffeine drinks daily.    Left handed      Living with parents.   Social Determinants of Health   Financial Resource Strain:   .  Difficulty of Paying Living Expenses: Not on file  Food Insecurity:   . Worried About Charity fundraiser in the Last Year: Not on file  . Ran Out of Food in the Last Year: Not on file  Transportation Needs:   . Lack of Transportation (Medical): Not on file  . Lack of Transportation (Non-Medical): Not on file  Physical Activity:   . Days of Exercise per Week: Not on file  . Minutes of Exercise per Session: Not on file  Stress:   . Feeling of Stress : Not on file  Social Connections:   . Frequency of Communication with Friends and Family: Not on file  . Frequency of Social Gatherings with Friends and Family: Not on file  . Attends Religious Services: Not on file  . Active Member of Clubs or Organizations: Not on file  . Attends Archivist Meetings: Not on file  . Marital Status: Not on file  Intimate Partner Violence:   . Fear of Current or Ex-Partner: Not on file  . Emotionally Abused: Not on file  . Physically Abused: Not on file  . Sexually Abused: Not on file    Family History  Problem Relation Age of Onset  . Lung cancer Maternal Grandmother   . Heart attack Maternal Grandfather   . Diabetes Paternal Grandfather     Current Outpatient Medications  Medication Sig Dispense Refill  . aspirin 81 MG EC tablet Take 81 mg by mouth daily.     Marland Kitchen atorvastatin (LIPITOR) 40 MG tablet Take 1  tablet (40 mg total) by mouth daily. 30 tablet 5  . blood glucose meter kit and supplies Dispense based on patient and insurance preference. Use up to four times daily as directed. (FOR ICD-10 E10.9, E11.9). 1 each 0  . clopidogrel (PLAVIX) 75 MG tablet TAKE 1 TABLET BY MOUTH DAILY (Patient taking differently: Take 75 mg by mouth daily. ) 90 tablet 3  . dapagliflozin propanediol (FARXIGA) 10 MG TABS tablet Take 10 mg by mouth daily.     . diazepam (VALIUM) 5 MG tablet Take 5 mg by mouth 2 (two) times daily.     Marland Kitchen FLUoxetine (PROZAC) 10 MG capsule Take 10 mg by mouth daily.     Marland Kitchen gabapentin (NEURONTIN) 300 MG capsule Take 4 capsules (1,200 mg total) by mouth 3 (three) times daily. 1080 capsule 3  . HYDROcodone-acetaminophen (NORCO) 10-325 MG tablet Take 1 tablet by mouth every 4 (four) hours as needed. 20 tablet 0  . labetalol (NORMODYNE) 300 MG tablet Take 300 mg by mouth 2 (two) times daily.    Marland Kitchen levETIRAcetam (KEPPRA XR) 500 MG 24 hr tablet Take 500 mg by mouth 2 (two) times daily.     Marland Kitchen lisinopril (ZESTRIL) 40 MG tablet Take 40 mg by mouth daily.     . metFORMIN (GLUCOPHAGE) 500 MG tablet Take 500 mg by mouth 2 (two) times daily.    . nitroGLYCERIN (NITRODUR - DOSED IN MG/24 HR) 0.2 mg/hr patch Place 1 patch (0.2 mg total) onto the skin daily. Place patch on foot in different spot daily 30 patch 12  . omeprazole (PRILOSEC) 20 MG capsule Take 20 mg by mouth daily.    . risperiDONE (RISPERDAL) 0.5 MG tablet Take 0.5 mg by mouth at bedtime.    . silver sulfADIAZINE (SILVADENE) 1 % cream Apply 1 application topically daily. 400 g 0  . traZODone (DESYREL) 100 MG tablet Take 200 mg by mouth at bedtime.  No current facility-administered medications for this visit.    Allergies  Allergen Reactions  . Contrast Media [Iodinated Diagnostic Agents] Other (See Comments)    2013-had CVA, went into AFR-combination ABT, contrast, low BP  . Amitriptyline Other (See Comments)    Unresponsive   .  Compazine Other (See Comments)    Alert but unable to move  . Morphine And Related Other (See Comments)    Chest pain  . Oxycodone Nausea And Vomiting    Severe n/v -  patient will not take oxycodone or percocet.  . Promethazine Swelling    Lips swell  . Adhesive [Tape] Rash    Long term exposure causes skin to tear   . Sumatriptan Rash     REVIEW OF SYSTEMS:  $RemoveB'[X]'sSbyrfTp$  denotes positive finding, $RemoveBeforeDEI'[ ]'AeypRptEZUFYAxpu$  denotes negative finding Cardiac  Comments:  Chest pain or chest pressure:    Shortness of breath upon exertion:    Short of breath when lying flat:    Irregular heart rhythm:        Vascular    Pain in calf, thigh, or hip brought on by ambulation:    Pain in feet at night that wakes you up from your sleep:     Blood clot in your veins:    Leg swelling:         Pulmonary    Oxygen at home:    Productive cough:     Wheezing:         Neurologic    Sudden weakness in arms or legs:     Sudden numbness in arms or legs:     Sudden onset of difficulty speaking or slurred speech:    Temporary loss of vision in one eye:     Problems with dizziness:         Gastrointestinal    Blood in stool:     Vomited blood:         Genitourinary    Burning when urinating:     Blood in urine:        Psychiatric    Major depression:         Hematologic    Bleeding problems:    Problems with blood clotting too easily:        Skin    Rashes or ulcers:        Constitutional    Fever or chills:      PHYSICAL EXAMINATION:  Vitals:   04/28/20 1111  BP: 103/63  Pulse: 91  Resp: 20  Temp: 98.3 F (36.8 C)  TempSrc: Temporal  SpO2: 97%  Weight: 212 lb 14.4 oz (96.6 kg)  Height: $Remove'5\' 5"'qYviXTW$  (1.651 m)    General:  WDWN in NAD; vital signs documented above Gait: Normal HENT: WNL, normocephalic Pulmonary: normal non-labored breathing , without wheezing Cardiac: regular HR, without  Murmurs without carotid bruit Abdomen: obese, soft, NT, no masses Vascular Exam/Pulses:  Right Left   Radial 2+ (normal) 2+ (normal)  Ulnar 2+ (normal) 2+ (normal)  Femoral 2+ (normal) 2+ (normal)  Popliteal 2+ (normal) 2+ (normal)  DP Not palpable 2+ (normal)  PT 1+ (weak) 2+ (normal)   Extremities: without ischemic changes, without Gangrene , without cellulitis; without open wounds; feel warm and well perfused. Left 4th and 5th toe amputation site well healed Musculoskeletal: no muscle wasting or atrophy  Neurologic: A&O X 3;  No focal weakness or paresthesias are detected Psychiatric:  The pt has Normal affect.   Non-Invasive Vascular Imaging:   +-------+-----------+-----------+------------+------------+  [name]/TBIToday's ABIToday's TBIPrevious ABIPrevious TBI  +-------+-----------+-----------+------------+------------+  Right 0.36    0.25    0.74    0.56      +-------+-----------+-----------+------------+------------+  Left  1.05    0.87    0.59    0.44      +-------+-----------+-----------+------------+------------+      +----------+--------+-----+--------+--------+--------+  LEFT   PSV cm/sRatioStenosisWaveformComments  +----------+--------+-----+--------+--------+--------+  CFA Distal134                    +----------+--------+-----+--------+--------+--------+  DFA    92                    +----------+--------+-----+--------+--------+--------+  SFA Prox 161                    +----------+--------+-----+--------+--------+--------+     Left Stent(s):SFA to above-knee popliteal  +---------------+---++---------++  Prox to Stent 164triphasic  +---------------+---++---------++  Proximal Stent 92 triphasic  +---------------+---++---------++  Mid Stent   76 biphasic   +---------------+---++---------++  Distal Stent  54 triphasic  +---------------+---++---------++  Distal to Stent117triphasic   +---------------+---++---------++     ASSESSMENT/PLAN:: 39 y.o. female here for follow up for peripheral artery disease. She is status post recent Angiogram with [facility] angioplasty and stenting by Dr. Carlis Abbott on 04/01/20. She however continues to have left greater than right lower extremity pain. Her pain does sound like rest pain but she is not a great historian so it is somewhat hard to evaluate. Clinically she has palpable Dp and PT pulses in left leg and it appears well perfused.  She does have significant decrease in right [name]/ TBI from prior study but the right leg is not the symptomatic leg. The left lower extremity [name]/ TBI is improved post intervention. Left lower extremity arterial duplex showing patient patent inflow and triphasic flow throughout left SFA and popliteal stents. Her symptoms do not seem to correlate with the clinical findings or duplex findings from today. I have discussed these findings with the patient and her mother and explained that likely her symptoms are not due to her arterial disease, but more musculoskeletal in nature.  She does have follow up ortho evaluation for a bulging disc in her back which may be source of her discomfort - She will continue her Aspirin, statin and Plavix - Based on duplex and clinical findings there is no indication for any intervention at this time - I have recommended that if she has new or worsening symptoms and if her ortho evaluation.management does not improve her symptoms that she should follow up earlier - I have scheduled her for 3 month follow up with left lower extremity Arterial duplex and ABIs  Karoline Caldwell, PA-C [facility] [phone]  Clinic MD:   Dr. Stanford Breed

## 2020-04-29 ENCOUNTER — Other Ambulatory Visit: Payer: Self-pay

## 2020-04-29 DIAGNOSIS — I739 Peripheral vascular disease, unspecified: Secondary | ICD-10-CM

## 2020-07-14 ENCOUNTER — Telehealth: Payer: Self-pay

## 2020-07-14 NOTE — Telephone Encounter (Signed)
Daughter called, patient is crying in pain - mostly when she walks, but some pain at rest when she is watching TV. Been occurring over the past few days. Denies cool/cold discoloration. Moved patient appt up 2 weeks to Friday with LE duplex & [name]

## 2020-07-17 ENCOUNTER — Encounter (HOSPITAL_COMMUNITY): Payer: Medicaid Other

## 2020-07-17 ENCOUNTER — Ambulatory Visit: Payer: Medicaid Other

## 2020-07-17 ENCOUNTER — Inpatient Hospital Stay (HOSPITAL_COMMUNITY): Admission: RE | Admit: 2020-07-17 | Payer: Medicaid Other | Source: Ambulatory Visit

## 2020-07-22 ENCOUNTER — Encounter: Payer: Self-pay | Admitting: Orthopaedic Surgery

## 2020-07-22 ENCOUNTER — Ambulatory Visit (INDEPENDENT_AMBULATORY_CARE_PROVIDER_SITE_OTHER): Payer: Medicaid Other | Admitting: Orthopaedic Surgery

## 2020-07-22 ENCOUNTER — Other Ambulatory Visit: Payer: Self-pay

## 2020-07-22 DIAGNOSIS — M79671 Pain in right foot: Secondary | ICD-10-CM | POA: Diagnosis not present

## 2020-07-22 NOTE — Progress Notes (Signed)
Office Visit Note   Patient: Taylor [name]           Date of Birth: 06/22/1980           MRN: [id number] Visit Date: 07/22/2020              Requested by: Monico Blitz, MD [address] [place],  [place] [address] PCP: Monico Blitz, MD   Assessment & Plan: Visit Diagnoses:  1. Pain in right foot     Plan: Probable nondisplaced fracture of metatarsal heads right third and fourth metatarsals.  Applied a wooden shoe with good comfort.  May bear weight to tolerance.  Office 2 weeks repeat films  Follow-Up Instructions: Return in about 2 weeks (around 08/05/2020).   Orders:  No orders of the defined types were placed in this encounter.  No orders of the defined types were placed in this encounter.     Procedures: No procedures performed   Clinical Data: No additional findings.   Subjective: Chief Complaint  Patient presents with  . Right Foot - Pain  Patient presents today for right foot pain. She states that she was walking down steps on 07/18/2020 and developed a sudden onset of right foot pain. No known injury. She spoke with her PCP Dr.Shaw and he ordered x-rays. She states that after the x-rays her doctor called and said she had fractured two of her metatarsals. She has been experiencing pain across her foot and swelling. The pain is constant, but worse with weightbearing. She is taking hydrocodone.  I reviewed the films on the PACS system and there was a questionable fracture of the metatarsal heads third and fourth toes.  Has seen Dr. Sharol Given in the past with ray amputations left foot  HPI  Review of Systems   Objective: Vital Signs: Ht '5\' 5"'$  (1.651 m)   Wt 212 lb (96.2 kg)   BMI 35.28 kg/m   Physical Exam  Ortho Exam right foot with forefoot edema.  There is pain directly over the third and fourth and to some extent the fifth metatarsal heads.  No deformity.  Does have altered sensibility of her foot related to her diabetes.  No mid or hindfoot pain  Specialty Comments:   No specialty comments available.  Imaging: No results found.   PMFS History: Patient Active Problem List   Diagnosis Date Noted  . Pain in right foot 07/22/2020  . Obesity, Class III, BMI 40-49.9 (morbid obesity) ([place]) 07/05/2019  . Diabetic foot infection ([employer]) 06/23/2019  . PAD (peripheral artery disease) ([place])   . Osteomyelitis of left foot ([place])   . Vertebral artery stenosis, left 03/25/2019  . VBI (vertebrobasilar insufficiency) 02/20/2019  . Intracranial carotid stenosis, left 01/23/2019  . 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 Tobacco use disorder 01/02/2015  . Cognitive impairment 01/02/2015  . Stroke ([place]) 09/07/2012  . Pain in upper limb 09/07/2012  . Pain in lower limb 09/07/2012  . Bipolar affective disorder ([place]) 09/07/2012  . Depressive disorder 09/07/2012  . Type II diabetes mellitus ([place]) 09/07/2012  . Memory loss 09/07/2012  . 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 Personal history of noncompliance with medical treatment, presenting hazards to health 05/25/2012  . Cerebral artery occlusion with cerebral infarction ([place]) 05/10/2012   Past Medical History:  Diagnosis Date  . Anxiety   . ARF (acute renal failure) ([place]) 2013  . Bipolar disorder ([place])   . Cerebral artery occlusion with cerebral infarction ([place])  11/13   due to elevated blood sugar 1700-off insulin 6 months  . Depression   . Diabetes mellitus    off insulin 6 months  . Diverticulitis   . Dyspnea    when walking  . GERD (gastroesophageal reflux disease)   . Headache(784.0)   . Hypertension   . Kidney dialysis status    on dialysis(stroke) off after 4-6 weeks  . Pneumonia 2013, 06/2018  . Respiratory failure ([place]) 11/13   with stroke - in hosp [facility] 12 weeks  . Seizures ([place])    during elvated blood sugsr episode 11/13  . Sepsis ([place])   . Stroke ([place])    11/2018, 12/2018 Weak left side, speech- slurred, Short term memeroy, Gait unsteady    . Vocal cord dysfunction     Family History  Problem Relation Age of Onset  . Lung cancer Maternal Grandmother   . Heart attack Maternal Grandfather   . Diabetes Paternal Grandfather     Past Surgical History:  Procedure Laterality Date  . ABDOMINAL AORTOGRAM W/LOWER EXTREMITY N/A 06/25/2019   Procedure: ABDOMINAL AORTOGRAM W/LOWER EXTREMITY;  Surgeon: Serafina Mitchell, MD;  Location: [facility];  Service: Cardiovascular;  Laterality: N/A;  . ABDOMINAL AORTOGRAM W/LOWER EXTREMITY N/A 10/22/2019   Procedure: ABDOMINAL AORTOGRAM W/LOWER EXTREMITY;  Surgeon: Serafina Mitchell, MD;  Location: [facility];  Service: Cardiovascular;  Laterality: N/A;  . ABDOMINAL AORTOGRAM W/LOWER EXTREMITY Left 04/01/2020   Procedure: ABDOMINAL AORTOGRAM W/LOWER EXTREMITY;  Surgeon: [name], MD;  Location: [facility];  Service: Cardiovascular;  Laterality: Left;  . AMPUTATION Left 07/03/2019   Procedure: LEFT FOOT 4TH AND 5TH RAY AMPUTATION;  Surgeon: Newt Minion, MD;  Location: [facility];  Service: Orthopedics;  Laterality: Left;  . APPENDECTOMY    . INSERTION OF DIALYSIS CATHETER  11/13   removed in 4-6 weeks  . IR ANGIO INTRA EXTRACRAN SEL COM CAROTID INNOMINATE BILAT MOD SED  02/07/2019  . IR ANGIO VERTEBRAL SEL VERTEBRAL BILAT MOD SED  02/07/2019  . IR ANGIOGRAM EXTREMITY LEFT  02/07/2019  . IR ANGIOGRAM EXTREMITY RIGHT  02/20/2019  . IR TRANSCATH EXCRAN VERT OR CAR A STENT  02/20/2019  . IR TRANSCATH EXCRAN VERT OR CAR A STENT  03/25/2019  . IR US GUIDE VASC ACCESS RIGHT  02/07/2019  . MULTIPLE EXTRACTIONS WITH ALVEOLOPLASTY N/A 02/24/2014   Procedure: MULTIPLE EXTRACTION WITH ALVEOLOPLASTY AND BIOPSY;  Surgeon: Gae Bon, DDS;  Location: [facility];  Service: Oral Surgery;  Laterality: N/A;  . PERIPHERAL VASCULAR ATHERECTOMY Left 06/25/2019   Procedure: PERIPHERAL VASCULAR ATHERECTOMY;  Surgeon: Serafina Mitchell, MD;  Location: [facility];  Service: Cardiovascular;   Laterality: Left;  superficial femoral  . PERIPHERAL VASCULAR BALLOON ANGIOPLASTY Left 10/22/2019   Procedure: PERIPHERAL VASCULAR BALLOON ANGIOPLASTY;  Surgeon: Serafina Mitchell, MD;  Location: [facility];  Service: Cardiovascular;  Laterality: Left;  superficial femoral  . PERIPHERAL VASCULAR INTERVENTION  04/01/2020   Procedure: PERIPHERAL VASCULAR INTERVENTION;  Surgeon: [name], MD;  Location: [facility];  Service: Cardiovascular;;  left SFA stent   . RADIOLOGY WITH ANESTHESIA N/A 02/20/2019   Procedure: RADIOLOGY WITH ANESTHESIA   STENTING;  Surgeon: Luanne Bras, MD;  Location: [facility];  Service: Radiology;  Laterality: N/A;  . RADIOLOGY WITH ANESTHESIA N/A 03/25/2019   Procedure: RADIOLOGY WITH ANESTHESIA STENTING;  Surgeon: Luanne Bras, MD;  Location: [facility];  Service: Radiology;  Laterality: N/A;  .  SKIN SPLIT GRAFT Left 07/03/2019   Procedure: APPLY SKIN GRAFT LEFT FOOT;  Surgeon: Newt Minion, MD;  Location: [facility];  Service: Orthopedics;  Laterality: Left;  . TRACHEOSTOMY  11/13   closed 1/14  . TRANSRECTAL DRAINAGE OF PELVIC ABSCESS  12/11  . VASCULAR SURGERY  04/01/2020   two stents placed in left leg   Social History   Occupational History  . Not on file  Tobacco Use  . Smoking status: Former Smoker    Packs/day: 0.60    Years: 23.00    Pack years: 13.80    Types: Cigarettes    Quit date: 02/05/2019    Years since quitting: 1.4  . Smokeless tobacco: Never Used  Vaping Use  . Vaping Use: Never used  Substance and Sexual Activity  . Alcohol use: No    Alcohol/week: 0.0 standard drinks  . Drug use: No  . Sexual activity: Not Currently    Birth control/protection: None

## 2020-07-29 ENCOUNTER — Encounter (HOSPITAL_COMMUNITY): Payer: Medicaid Other

## 2020-07-29 ENCOUNTER — Ambulatory Visit: Payer: Medicaid Other

## 2020-08-18 ENCOUNTER — Telehealth (HOSPITAL_COMMUNITY): Payer: Self-pay

## 2020-08-18 NOTE — Telephone Encounter (Signed)
Called to schedule us carotid, no answer, left vm. AW  

## 2020-08-19 ENCOUNTER — Ambulatory Visit: Payer: Medicaid Other | Admitting: Orthopaedic Surgery

## 2020-08-19 ENCOUNTER — Other Ambulatory Visit: Payer: Self-pay

## 2020-10-20 ENCOUNTER — Other Ambulatory Visit: Payer: Self-pay | Admitting: Neurology

## 2020-10-22 ENCOUNTER — Other Ambulatory Visit: Payer: Self-pay | Admitting: Physician Assistant

## 2020-10-22 DIAGNOSIS — I739 Peripheral vascular disease, unspecified: Secondary | ICD-10-CM

## 2020-12-09 IMAGING — XA IR ANGIO INTRA EXTRACRAN SEL COM CAROTID INNOMINATE BILAT MOD SE
1 series · 11 of 24 positions shown · IV contrast (IODINE)
Comparison: MRI MRA of the brain of 01/08/2019.

CLINICAL DATA: History of vertebrobasilar ischemic strokes.
Abnormal MRA MRI of the brain.

EXAM:
IR ANGIO VERTEBRAL SEL VERTEBRAL BILAT MOD SED; BILATERAL COMMON
CAROTID AND INNOMINATE ANGIOGRAPHY; IR ULTRASOUND GUIDANCE VASC
ACCESS RIGHT; LEFT EXTREMITY ARTERIOGRAPHY
TECHNIQUE: Informed written consent was obtained from the patient after a
thorough discussion of the procedural risks, benefits and
alternatives. All questions were addressed. Maximal Sterile Barrier
Technique was utilized including caps, mask, sterile gowns, sterile
gloves, sterile drape, hand hygiene and skin antiseptic. A timeout
was performed prior to the initiation of the procedure.

[Series 300: dr. (person_name) · 11 of 202 slices shown]
[im 9/202]
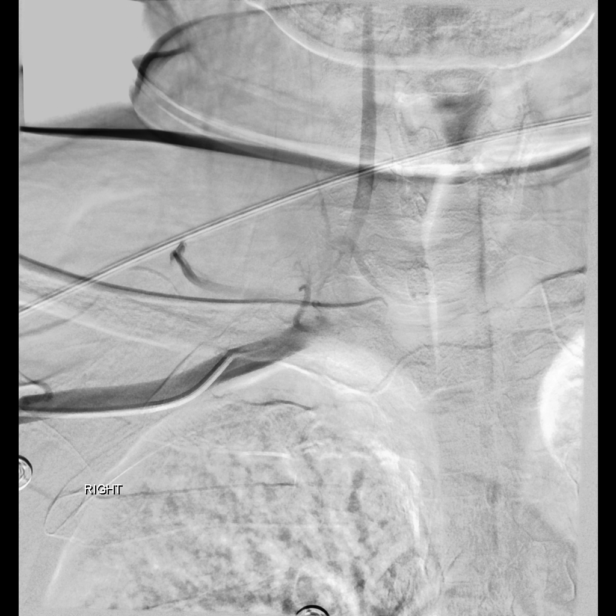
[im 27/202]
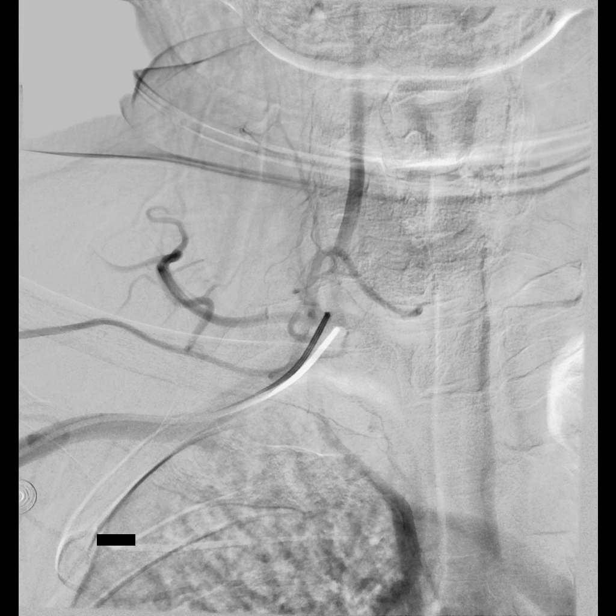
[im 44/202]
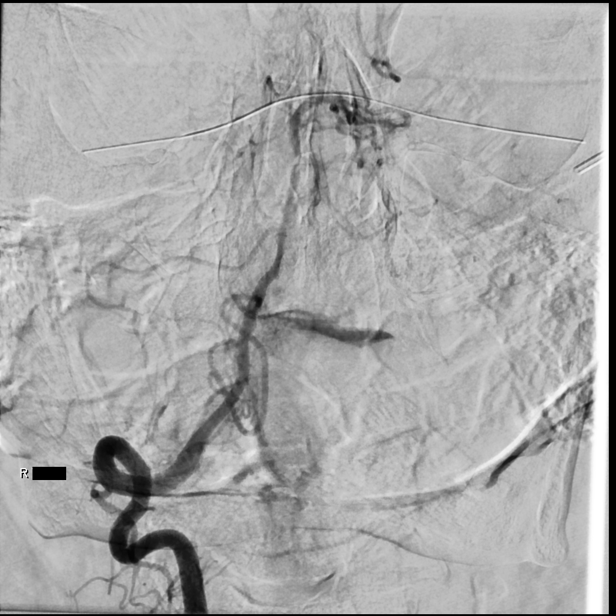
[im 62/202]
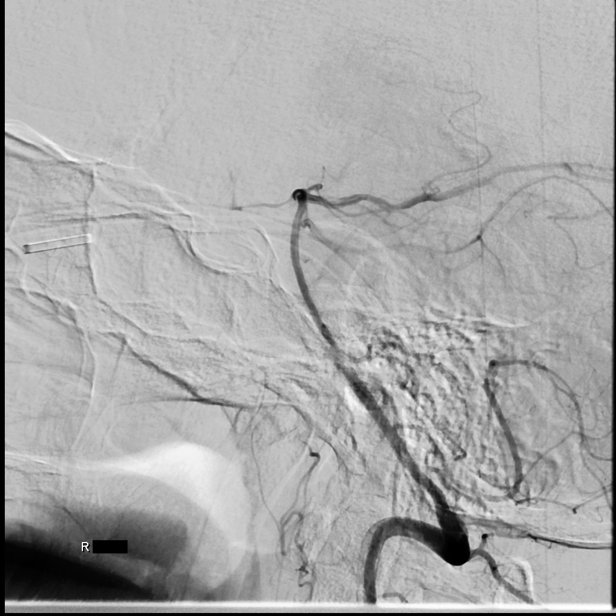
[im 79/202]
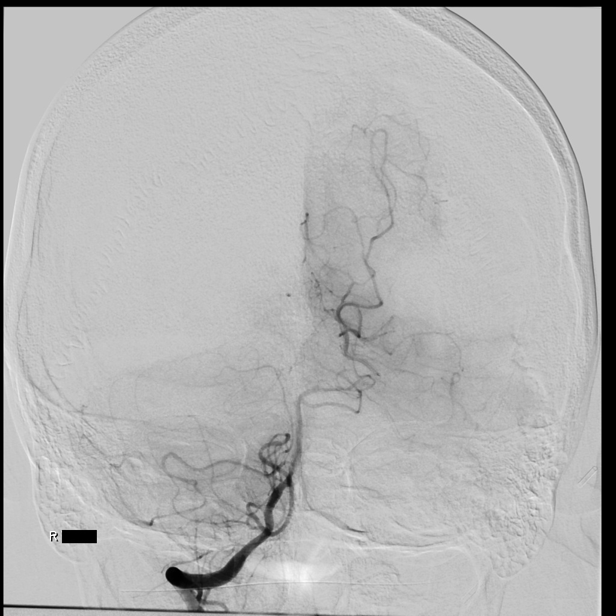
[im 105/202]
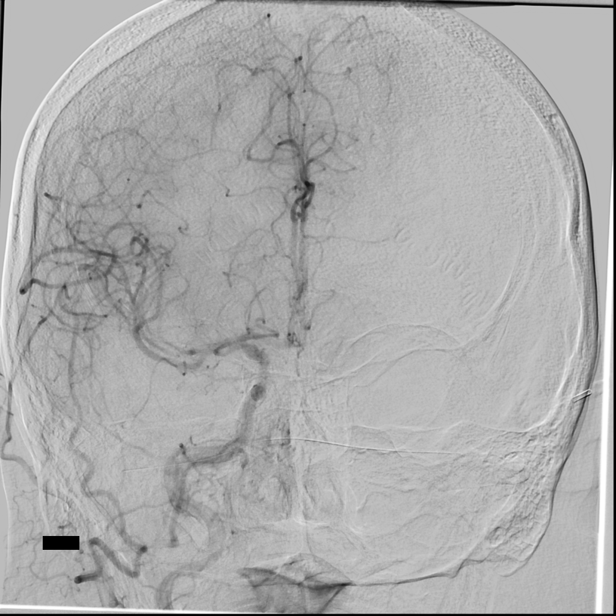
[im 123/202]
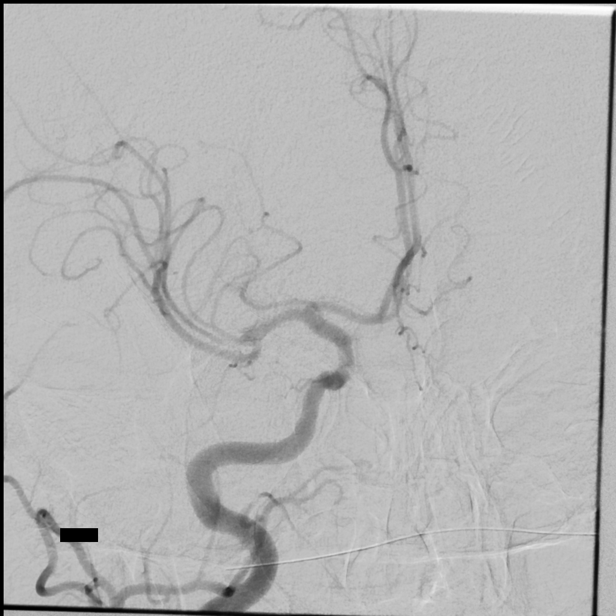
[im 140/202]
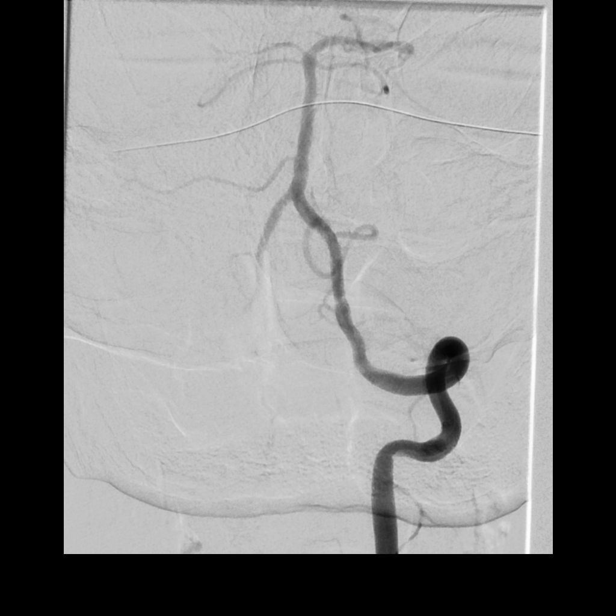
[im 158/202]
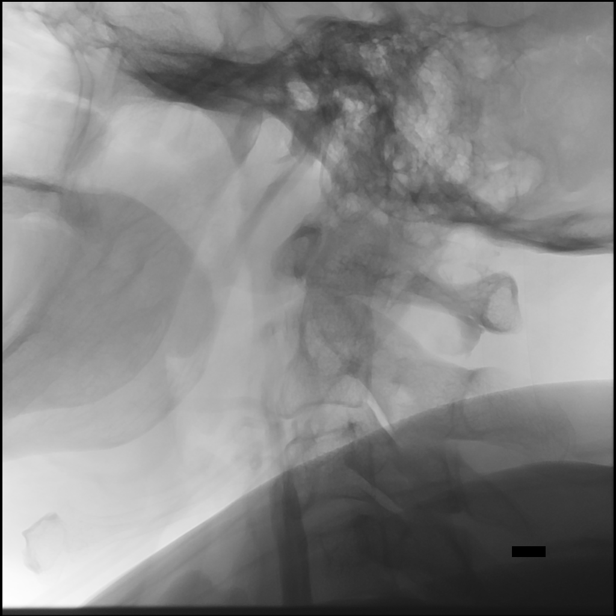
[im 175/202]
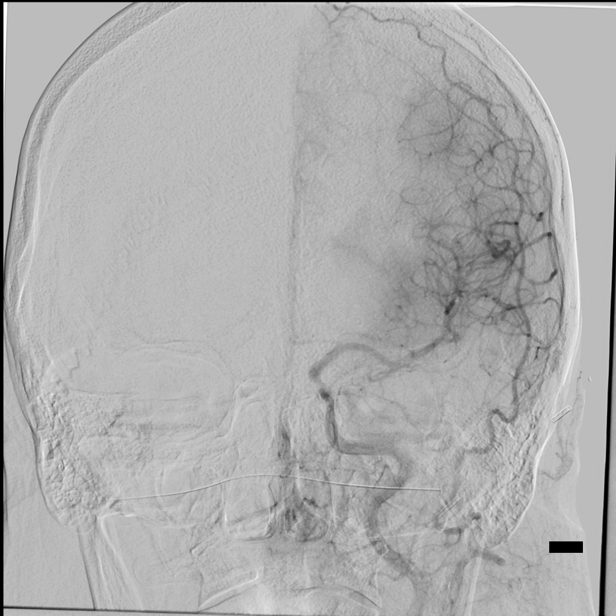
[im 193/202]
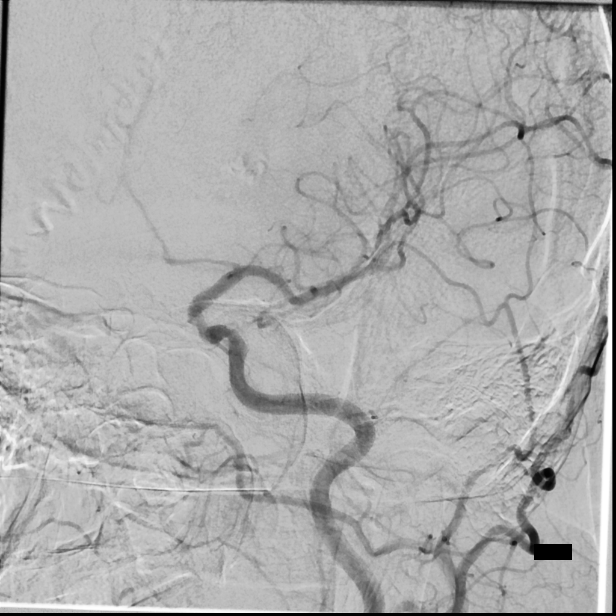

[11 of 24 positions shown; findings below may reference images not displayed]

MEDICATIONS:
Heparin 1777 units IA;. No antibiotic was administered within 1 hour
of the procedure.

ANESTHESIA/SEDATION:
Versed 1 mg IV; Fentanyl 25 mcg IV.  Benadryl 25 mg IV.

Moderate Sedation Time:  50 minutes

The patient was continuously monitored during the procedure by the
interventional radiology nurse under my direct supervision.

CONTRAST:  Isovue 300 approximately 60 mL.

FLUOROSCOPY TIME:  Fluoroscopy Time: 17 minutes 6 seconds (5515
mGy).

COMPLICATIONS:
None immediate.
The right forearm to the wrist was then prepped and draped in the
usual sterile fashion.

The radial artery was palpated and evaluated with ultrasound in
terms of morphology. This was then documented by ultrasound imaging.

Thereafter, using ultrasound guidance, a micropuncture needle was
advanced into the right radial artery without difficulty. This was
then exchanged over a 0.018 inch micro guidewire for a [DATE] French
radial sheath. The obturator and the wire were then removed. Good
aspiration was obtained from the side port of the sheath. A cocktail
of 2.5 mg of verapamil, 1777 units of heparin, and 200 mcg of
nitroglycerin was then injected in a diluted form without any
difficulty.

A right radial artery angiogram was then obtained through the radial
sheath.

Over a 0.035 inch Roadrunner guidewire, a 5 Melinde Depasupil 2
diagnostic catheter was then advanced to the right subclavian artery
and then the aortic arch. This was then performed in the usual
manner. Selective cannulation was then performed of the right
vertebral artery, the right common carotid artery, the left common
carotid artery and the left vertebral artery, and the subclavian
artery. Following the procedure, the diagnostic catheter was
removed. The sheath was also removed with the successful application
of a right radial wrist band for hemostasis. The distal radial pulse
was verified.
FINDINGS: The origin of the right vertebral artery is widely patent.

The vessel is seen to opacify to the cranial skull base. The right
vertebrobasilar junction and the right posterior-inferior cerebellar
artery demonstrate wide patency. The opacified basilar artery, the
left posterior cerebral artery, the superior cerebellar arteries and
the anterior-inferior cerebellar arteries demonstrate wide patency
into the capillary and venous phases.

Mild arteriosclerotic changes are seen involving the mid to distal
basilar artery.

There appears to be tapered narrowing with occlusion of the right
posterior cerebral artery P1 segment.

Unopacified blood is seen in the basilar artery from the
contralateral vertebral artery.

The right common carotid arteriogram demonstrates the right external
carotid artery and its major branches to be widely patent.

The right internal carotid artery at the bulb to the cranial skull
base demonstrates wide patency. The petrous and the cavernous
segments are widely patent.

There is approximately 50% stenosis of the caval cavernous segment
of the right internal carotid artery and to a lesser degree the
supraclinoid right ICA.

The right middle cerebral artery and the right anterior cerebral
artery demonstrate opacification into the capillary and venous
phases.

There is mild tapered stenosis of the proximal superior division of
the right middle cerebral artery.

Transient cross-filling via the anterior communicating artery of the
left anterior cerebral A2 segment and distally is noted.

The dominant left vertebral artery origin is from the aortic arch
between the origins of the left common carotid artery and the left
subclavian arteries. There is a severe high-grade stenosis at the
origin of the left vertebral artery.

Antegrade flow is seen in the distal left vertebral artery to the
cranial skull base. Approximately 50% stenosis is seen of the
vertebrobasilar junction proximal to the origin of the left
posteroinferior cerebellar artery which itself demonstrates a
moderate stenosis at its origin.

Distal to this the opacified portion of the basilar artery, the left
posterior cerebral arteries, the superior cerebellar arteries and
the anterior-inferior cerebellar arteries is seen into the capillary
and venous phases. Unopacified blood is seen in the basilar artery
from the contralateral vertebral artery.

Again demonstrated is a tapered narrowing of the right posterior
cerebral P1 segment to complete occlusion.

The left common carotid arteriogram demonstrates the left external
carotid artery and its major branches to be widely patent.

The left internal carotid artery at the bulb has approximately 50%
stenosis associated with an ulcerated plaque. No intraluminal
filling defects are seen.

More distally the left internal carotid artery is seen to opacify to
the cranial skull base.

There is an approximate 50% stenosis of the left internal carotid
artery in the petrous cavernous junction, and of approximately 70%
in the caval cavernous segment.

The supraclinoid left ICA is widely patent.

The left middle cerebral artery opacifies into the capillary and
venous phases. The left anterior cerebral artery A1 segment is
hypoplastic developmentally.

More distal opacification is seen in the left anterior cerebral
artery A2 segment and distally.
IMPRESSION: Severe high-grade stenosis of the dominant left vertebral artery at
its origin from the aortic arch between the origins of the left
subclavian artery and the left common carotid artery.

Approximately 50% stenosis of the left internal carotid artery
petrous cavernous junction, and of 70% in the left ICA cavernous
segment.

Approximately 50% stenosis of the left internal carotid artery at
the bulb associated with ulceration. Angiographically occluded right
posterior cerebral artery. Approximately 50% stenosis of the left
vertebrobasilar junction proximal to the left posterior-inferior
cerebellar artery.

Approximately 50% stenosis of the right internal carotid artery
caval cavernous segment.

PLAN:
These findings were reviewed with the patient and the patient's
father. Optional management regarding dual antiplatelets versus
consideration of endovascular revascularization of the dominant left
vertebral artery at its origin were reviewed. The father reportedly
will discuss further with spouse regarding options and get back with
us regarding the decision.

## 2021-01-12 ENCOUNTER — Other Ambulatory Visit: Payer: Self-pay

## 2021-01-12 ENCOUNTER — Telehealth: Payer: Self-pay

## 2021-01-12 DIAGNOSIS — I739 Peripheral vascular disease, unspecified: Secondary | ICD-10-CM

## 2021-01-12 NOTE — Telephone Encounter (Signed)
Patient's mother calls today to report that patient is having rest pain in her left leg. Patient states, "it feels like no blood is getting to the bottom of my leg." Patient has some deficits due to multiple strokes and is unclear on how long the pain has been occurring. Says it feels like it is shooting down the leg. It is not cold or discolored. She has had multiple interventions to this leg in the past. They are hoping to go out of town this weekend/next week. Placed on [facility] schedule Friday for LLE duplex, bil [name] and follow up.

## 2021-01-15 ENCOUNTER — Ambulatory Visit: Payer: Medicaid Other | Admitting: Vascular Surgery

## 2021-01-15 ENCOUNTER — Encounter (HOSPITAL_COMMUNITY): Payer: Medicaid Other

## 2021-01-18 ENCOUNTER — Other Ambulatory Visit: Payer: Self-pay | Admitting: Neurology

## 2021-01-25 ENCOUNTER — Telehealth (INDEPENDENT_AMBULATORY_CARE_PROVIDER_SITE_OTHER): Payer: Medicaid Other | Admitting: Neurology

## 2021-01-25 ENCOUNTER — Other Ambulatory Visit: Payer: Self-pay

## 2021-01-25 VITALS — Wt 215.0 lb

## 2021-01-25 DIAGNOSIS — G40909 Epilepsy, unspecified, not intractable, without status epilepticus: Secondary | ICD-10-CM | POA: Diagnosis not present

## 2021-01-25 DIAGNOSIS — I639 Cerebral infarction, unspecified: Secondary | ICD-10-CM

## 2021-01-25 DIAGNOSIS — G890 Central pain syndrome: Secondary | ICD-10-CM | POA: Diagnosis not present

## 2021-01-25 DIAGNOSIS — R252 Cramp and spasm: Secondary | ICD-10-CM

## 2021-01-25 MED ORDER — TIZANIDINE HCL 2 MG PO TABS: 2.0000 mg | ORAL_TABLET | Freq: Every evening | ORAL | 5 refills | Status: DC | PRN

## 2021-01-25 MED ORDER — CLOPIDOGREL BISULFATE 75 MG PO TABS: 75.0000 mg | ORAL_TABLET | Freq: Every day | ORAL | 3 refills | Status: DC

## 2021-01-25 NOTE — Progress Notes (Signed)
   Virtual Visit via Video Note The purpose of this virtual visit is to provide medical care while limiting exposure to the novel coronavirus.    Consent was obtained for video visit:  Yes.   Answered questions that patient had about telehealth interaction:  Yes.   I discussed the limitations, risks, security and privacy concerns of performing an evaluation and management service by telemedicine. I also discussed with the patient that there may be a patient responsible charge related to this service. The patient expressed understanding and agreed to proceed.  Pt location: Home Physician Location: office Name of referring provider:  Monico Blitz, MD I connected with Taylor [name] at patients initiation/request on 01/25/2021 at 10:30 AM EDT by video enabled telemedicine application and verified that I am speaking with the correct person using two identifiers. Pt MRN:  [id number] Pt DOB:  June 02, 1981 Video Participants:  Taylor [name]   History of Present Illness: This is a 40 y.o. female returning for follow-up of cerebrovascular disease with left thalamic infarct (12/2018) and left paramedial pontine stroke (11/2018) due to intracranial stenosis and new complaints of muscle cramps.  The past two nights, she has been having severe bilateral leg cramps.  No changes to her medication and she is staying well-hydrated.  She remains complaint with her medications.  No interval seizures or stroke.  She continues to have tingling over the left side of her body.  Observations/Objective:   Vitals:   01/25/21 0957  Weight: 215 lb (97.5 kg)   Patient is awake, alert, and appears comfortable.  Oriented x 4.   Extraocular muscles are intact. No ptosis.  Face is symmetric.  Speech is not dysarthric.  Antigravity in all extremities.  No pronator drift. Gait appears mildly wide-based, stable, unassisted.    Lab Results  Component Value Date   HGBA1C 10.0 (H) 06/23/2019   Lab Results  Component Value Date    CHOL 119 01/24/2019   HDL 30 (L) 01/24/2019   LDLCALC 35 01/24/2019   TRIG 270 (H) 01/24/2019   CHOLHDL 4.0 01/24/2019     Assessment and Plan:  Muscle cramps - start tizanidine '2mg'$  at bedtime  Intracranial stenosis with history of multiple strokes (R PCA and bithalamic stroke 2013, left thalamic stroke 12/2018, and left paramedial pontine stroke 11/2018).  She is s/p left vertebral artery stenting. She has known LICA stenosis, left vertebrobasilar stneosis, and cavernous segment of RICA stenosis.  Risk factors include poorly controlled diabetes mellitus, hypertriglyceridemia.  BP is well-controlled.  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   - Continue ASA '81mg'$  + plavix '75mg'$  - PCP managing hypertension, diabetes, and hyperlipidemia  Dejerine Roussy syndrome  - Continue aspirin '81mg'$  + plavix '75mg'$  daily  - Continue lipitor '40mg'$  daily  - Continue gabapentin to '1200mg'$  TID  4.   Seizure disorder, stable on Keppra '500mg'$  BID  Follow Up Instructions:   I discussed the assessment and treatment plan with the patient. The patient was provided an opportunity to ask questions and all were answered. The patient agreed with the plan and demonstrated an understanding of the instructions.   The patient was advised to call back or seek an in-person evaluation if the symptoms worsen or if the condition fails to improve as anticipated.  Follow-up in 9 months   [place], DO

## 2021-01-28 ENCOUNTER — Other Ambulatory Visit: Payer: Self-pay | Admitting: Neurology

## 2021-02-25 ENCOUNTER — Telehealth (HOSPITAL_COMMUNITY): Payer: Self-pay

## 2021-02-25 NOTE — Telephone Encounter (Signed)
Called to schedule us carotid, no answer, no vm. AW 

## 2021-04-19 ENCOUNTER — Other Ambulatory Visit: Payer: Self-pay | Admitting: Neurology

## 2021-07-14 ENCOUNTER — Other Ambulatory Visit: Payer: Self-pay | Admitting: Neurology

## 2021-10-14 ENCOUNTER — Other Ambulatory Visit: Payer: Self-pay | Admitting: Neurology

## 2021-10-25 ENCOUNTER — Other Ambulatory Visit: Payer: Self-pay | Admitting: Neurology

## 2022-01-12 ENCOUNTER — Other Ambulatory Visit: Payer: Self-pay | Admitting: Neurology

## 2022-01-19 ENCOUNTER — Telehealth: Payer: Self-pay

## 2022-01-19 DIAGNOSIS — I739 Peripheral vascular disease, unspecified: Secondary | ICD-10-CM

## 2022-01-19 DIAGNOSIS — I70222 Atherosclerosis of native arteries of extremities with rest pain, left leg: Secondary | ICD-10-CM

## 2022-01-19 NOTE — Telephone Encounter (Unsigned)
Pt's mother, Lattie Haw, called stating that the pt's legs are getting worse and she doesn't know what to do.  Reviewed pt's chart, returned call for clarification, two identifiers used. Pt's calf muscles feel like they are "jumping" and they feel tight all the time. She has significant pain, to the point of tears, for which she is taking Tramadol. Appts scheduled for 8/14 for [place] and Dr Trula Slade. Confirmed understanding.

## 2022-01-24 ENCOUNTER — Other Ambulatory Visit: Payer: Self-pay | Admitting: Neurology

## 2022-01-24 ENCOUNTER — Ambulatory Visit (INDEPENDENT_AMBULATORY_CARE_PROVIDER_SITE_OTHER)
Admission: RE | Admit: 2022-01-24 | Discharge: 2022-01-24 | Disposition: A | Discharge status: Home / Self Care | Payer: Medicaid Other | Source: Ambulatory Visit | Attending: Physician Assistant | Admitting: Physician Assistant

## 2022-01-24 ENCOUNTER — Encounter: Payer: Self-pay | Admitting: Surgery

## 2022-01-24 ENCOUNTER — Ambulatory Visit (INDEPENDENT_AMBULATORY_CARE_PROVIDER_SITE_OTHER): Payer: Medicaid Other | Admitting: Surgery

## 2022-01-24 ENCOUNTER — Ambulatory Visit (HOSPITAL_COMMUNITY)
Admission: RE | Admit: 2022-01-24 | Discharge: 2022-01-24 | Disposition: A | Discharge status: Home / Self Care | Payer: Medicaid Other | Source: Ambulatory Visit | Attending: Surgery | Admitting: Surgery

## 2022-01-24 VITALS — BP 104/68 | HR 93 | Temp 97.9°F | Resp 20 | Ht 65.0 in | Wt 201.0 lb

## 2022-01-24 DIAGNOSIS — I70222 Atherosclerosis of native arteries of extremities with rest pain, left leg: Secondary | ICD-10-CM

## 2022-01-24 DIAGNOSIS — I739 Peripheral vascular disease, unspecified: Secondary | ICD-10-CM | POA: Diagnosis present

## 2022-01-24 DIAGNOSIS — I70223 Atherosclerosis of native arteries of extremities with rest pain, bilateral legs: Secondary | ICD-10-CM

## 2022-01-24 NOTE — H&P (View-Only) (Signed)
[facility]  Patient name: Taylor [name] MRN: [id number] DOB: 1981/05/18 Sex: female   REASON FOR VISIT:    Follow-up  HISOTRY OF PRESENT ILLNESS:    Taylor [name] is a 41 y.o. female who has undergone the following procedures:  06/25/2019: Left superficial femoral and popliteal artery atherectomy with drug-coated balloon angioplasty (ulcer) Rozlyn Yerby  07/03/2019: Left fourth and fifth ray amputation Sharol Given)  10/22/2019: DCB, left SFA (ulcer), Daxon Kyne  04/01/2020: Left SFA DCB, left SFA stent (ulcer), Clark   She is back today complaining of rest pain and short distance claudication.  She states that this has been going on for several months.  She does not have any open wounds.  The patient is a diabetic.  She does have a history of stroke.  She is a former smoker.  She is on dual antiplatelet therapy and a statin for hypercholesterolemia  PAST MEDICAL HISTORY:   Past Medical History:  Diagnosis Date   Anxiety    ARF (acute renal failure) ([place]) 2013   Bipolar disorder ([place])    Cerebral artery occlusion with cerebral infarction ([place]) 11/13   due to elevated blood sugar 1700-off insulin 6 months   Depression    Diabetes mellitus    off insulin 6 months   Diverticulitis    Dyspnea    when walking   GERD (gastroesophageal reflux disease)    Headache(784.0)    Hypertension    Kidney dialysis status    on dialysis(stroke) off after 4-6 weeks   Pneumonia 2013, 06/2018   Respiratory failure ([place]) 11/13   with stroke - in hosp [facility] 12 weeks   Seizures ([place])    during elvated blood sugsr episode 11/13   Sepsis ([place])    Stroke ([place])    11/2018, 12/2018 Weak left side, speech- slurred, Short term memeroy, Gait unsteady    Vocal cord dysfunction      FAMILY HISTORY:   Family History  Problem Relation Age of Onset   Lung cancer Maternal Grandmother    Heart attack Maternal Grandfather    Diabetes Paternal  Grandfather     SOCIAL HISTORY:   Social History   Tobacco Use   Smoking status: Former    Packs/day: 0.60    Years: 23.00    Total pack years: 13.80    Types: Cigarettes    Quit date: 02/05/2019    Years since quitting: 2.9   Smokeless tobacco: Never  Substance Use Topics   Alcohol use: No    Alcohol/week: 0.0 standard drinks of alcohol     ALLERGIES:   Allergies  Allergen Reactions   Contrast Media [Iodinated Contrast Media] Other (See Comments)    2013-had CVA, went into AFR-combination ABT, contrast, low BP   Amitriptyline Other (See Comments)    Unresponsive    Compazine Other (See Comments)    Alert but unable to move   Morphine And Related Other (See Comments)    Chest pain   Oxycodone Nausea And Vomiting    Severe n/v -  patient will not take oxycodone or percocet.   Promethazine Swelling    Lips swell   Adhesive [Tape] Rash    Long term exposure causes skin to tear    Sumatriptan Rash     CURRENT MEDICATIONS:   Current Outpatient Medications  Medication Sig Dispense Refill   aspirin 81 MG EC tablet Take 81 mg by mouth daily.      atorvastatin (LIPITOR) 40 MG  tablet TAKE 1 TABLET BY MOUTH DAILY 90 tablet 2   blood glucose meter kit and supplies Dispense based on patient and insurance preference. Use up to four times daily as directed. (FOR ICD-10 E10.9, E11.9). 1 each 0   clopidogrel (PLAVIX) 75 MG tablet Take 1 tablet (75 mg total) by mouth daily. 90 tablet 3   dapagliflozin propanediol (FARXIGA) 10 MG TABS tablet Take 10 mg by mouth daily.      diazepam (VALIUM) 5 MG tablet Take 5 mg by mouth 2 (two) times daily.      FLUoxetine (PROZAC) 10 MG capsule Take 10 mg by mouth daily.      gabapentin (NEURONTIN) 300 MG capsule TAKE FOUR CAPSULES BY MOUTH THREE TIMES DAILY 1080 capsule 3   labetalol (NORMODYNE) 300 MG tablet Take 300 mg by mouth 2 (two) times daily.     levETIRAcetam (KEPPRA XR) 500 MG 24 hr tablet Take 500 mg by mouth 2 (two) times daily.       lisinopril (ZESTRIL) 40 MG tablet Take 40 mg by mouth daily.      metFORMIN (GLUCOPHAGE) 500 MG tablet Take 500 mg by mouth 2 (two) times daily.     omeprazole (PRILOSEC) 20 MG capsule Take 20 mg by mouth daily.     risperiDONE (RISPERDAL) 0.5 MG tablet Take 0.5 mg by mouth at bedtime.     tiZANidine (ZANAFLEX) 2 MG tablet TAKE 1 TABLET BY MOUTH AT BEDTIME AS NEEDED FOR MUSCLE SPASMS 30 tablet 2   traMADol (ULTRAM) 50 MG tablet Take 50 mg by mouth 2 (two) times daily as needed.     traZODone (DESYREL) 100 MG tablet Take 200 mg by mouth at bedtime.      No current facility-administered medications for this visit.    REVIEW OF SYSTEMS:   _0  denotes positive finding, _1  denotes negative finding Cardiac  Comments:  Chest pain or chest pressure:    Shortness of breath upon exertion:    Short of breath when lying flat:    Irregular heart rhythm:        Vascular    Pain in calf, thigh, or hip brought on by ambulation: x   Pain in feet at night that wakes you up from your sleep:  x   Blood clot in your veins:    Leg swelling:         Pulmonary    Oxygen at home:    Productive cough:     Wheezing:         Neurologic    Sudden weakness in arms or legs:     Sudden numbness in arms or legs:     Sudden onset of difficulty speaking or slurred speech:    Temporary loss of vision in one eye:     Problems with dizziness:         Gastrointestinal    Blood in stool:     Vomited blood:         Genitourinary    Burning when urinating:     Blood in urine:        Psychiatric    Major depression:         Hematologic    Bleeding problems:    Problems with blood clotting too easily:        Skin    Rashes or ulcers:        Constitutional    Fever or chills:      PHYSICAL EXAM:  Vitals:   01/24/22 1353  BP: 104/68  Pulse: 93  Resp: 20  Temp: 97.9 F (36.6 C)  SpO2: 94%  Weight: 201 lb (91.2 kg)  Height: _0  (1.651 m)    GENERAL: The patient is a well-nourished  female, in no acute distress. The vital signs are documented above. CARDIAC: There is a regular rate and rhythm.  VASCULAR: Nonpalpable pedal pulses PULMONARY: Non-labored respirations MUSCULOSKELETAL: There are no major deformities or cyanosis. NEUROLOGIC: No focal weakness or paresthesias are detected. SKIN: There are no ulcers or rashes noted. PSYCHIATRIC: The patient has a normal affect.  STUDIES:   I have reviewed the following:  [name]/TBIToday's ABIToday's TBIPrevious ABIPrevious TBI  +-------+-----------+-----------+------------+------------+  Right  0.70       0.34                                 +-------+-----------+-----------+------------+------------+  Left   0.34                                            +-------+-----------+-----------+------------+------------+  Left: Occluded superficial femoral artery to above-knee popliteal stent in  the prox-mid segments with monophasic flow in the distal stent.  Monophasic flow noted in the popliteal artery, PTA, Pero A and ATA.  MEDICAL ISSUES:   Left leg rest pain: The patient's previously placed stent has occluded.  The patient is having severe pain.  She is taking tramadol with minimal relief.  She does not have any active wounds.  I have recommended that we proceed with diagnostic angiography via right femoral approach, imaging both legs next Tuesday, August 22.  I have then scheduled her for left leg bypass on Friday.  I discussed with the patient that I am concerned about the longevity of a bypass given her track record and age.  Regardless, because of her symptoms I feel that we need to proceed with revascularization.  I am stopping her Plavix today.    Leia Alf, MD, FACS [facility] [phone] Pager [phone]

## 2022-01-24 NOTE — Progress Notes (Signed)
[facility] [place]  Patient name: Taylor [name] MRN: [id number] DOB: 1981/05/18 Sex: female   REASON FOR VISIT:    Follow-up  HISOTRY OF PRESENT ILLNESS:    Taylor [name] is a 41 y.o. female who has undergone the following procedures:  06/25/2019: Left superficial femoral and popliteal artery atherectomy with drug-coated balloon angioplasty (ulcer) Taylor [name]  07/03/2019: Left fourth and fifth ray amputation Taylor [name])  10/22/2019: DCB, left SFA (ulcer), Taylor [name]  04/01/2020: Left SFA DCB, left SFA stent (ulcer), [place]   She is back today complaining of rest pain and short distance claudication.  She states that this has been going on for several months.  She does not have any open wounds.  The patient is a diabetic.  She does have a history of stroke.  She is a former smoker.  She is on dual antiplatelet therapy and a statin for hypercholesterolemia  PAST MEDICAL HISTORY:   Past Medical History:  Diagnosis Date   Anxiety    ARF (acute renal failure) ([place]) 2013   Bipolar disorder ([place])    Cerebral artery occlusion with cerebral infarction ([place]) 11/13   due to elevated blood sugar 1700-off insulin 6 months   Depression    Diabetes mellitus    off insulin 6 months   Diverticulitis    Dyspnea    when walking   GERD (gastroesophageal reflux disease)    Headache(784.0)    Hypertension    Kidney dialysis status    on dialysis(stroke) off after 4-6 weeks   Pneumonia 2013, 06/2018   Respiratory failure ([place]) 11/13   with stroke - in hosp [facility] 12 weeks   Seizures ([place])    during elvated blood sugsr episode 11/13   Sepsis ([place])    Stroke ([place])    11/2018, 12/2018 Weak left side, speech- slurred, Short term memeroy, Gait unsteady    Vocal cord dysfunction      FAMILY HISTORY:   Family History  Problem Relation Age of Onset   Lung cancer Maternal Grandmother    Heart attack Maternal Grandfather    Diabetes Paternal  Grandfather     SOCIAL HISTORY:   Social History   Tobacco Use   Smoking status: Former    Packs/day: 0.60    Years: 23.00    Total pack years: 13.80    Types: Cigarettes    Quit date: 02/05/2019    Years since quitting: 2.9   Smokeless tobacco: Never  Substance Use Topics   Alcohol use: No    Alcohol/week: 0.0 standard drinks of alcohol     ALLERGIES:   Allergies  Allergen Reactions   Contrast Media [Iodinated Contrast Media] Other (See Comments)    2013-had CVA, went into AFR-combination ABT, contrast, low BP   Amitriptyline Other (See Comments)    Unresponsive    Compazine Other (See Comments)    Alert but unable to move   Morphine And Related Other (See Comments)    Chest pain   Oxycodone Nausea And Vomiting    Severe n/v -  patient will not take oxycodone or percocet.   Promethazine Swelling    Lips swell   Adhesive [Tape] Rash    Long term exposure causes skin to tear    Sumatriptan Rash     CURRENT MEDICATIONS:   Current Outpatient Medications  Medication Sig Dispense Refill   aspirin 81 MG EC tablet Take 81 mg by mouth daily.      atorvastatin (LIPITOR) 40 MG  tablet TAKE 1 TABLET BY MOUTH DAILY 90 tablet 2   blood glucose meter kit and supplies Dispense based on patient and insurance preference. Use up to four times daily as directed. (FOR ICD-10 E10.9, E11.9). 1 each 0   clopidogrel (PLAVIX) 75 MG tablet Take 1 tablet (75 mg total) by mouth daily. 90 tablet 3   dapagliflozin propanediol (FARXIGA) 10 MG TABS tablet Take 10 mg by mouth daily.      diazepam (VALIUM) 5 MG tablet Take 5 mg by mouth 2 (two) times daily.      FLUoxetine (PROZAC) 10 MG capsule Take 10 mg by mouth daily.      gabapentin (NEURONTIN) 300 MG capsule TAKE FOUR CAPSULES BY MOUTH THREE TIMES DAILY 1080 capsule 3   labetalol (NORMODYNE) 300 MG tablet Take 300 mg by mouth 2 (two) times daily.     levETIRAcetam (KEPPRA XR) 500 MG 24 hr tablet Take 500 mg by mouth 2 (two) times daily.       lisinopril (ZESTRIL) 40 MG tablet Take 40 mg by mouth daily.      metFORMIN (GLUCOPHAGE) 500 MG tablet Take 500 mg by mouth 2 (two) times daily.     omeprazole (PRILOSEC) 20 MG capsule Take 20 mg by mouth daily.     risperiDONE (RISPERDAL) 0.5 MG tablet Take 0.5 mg by mouth at bedtime.     tiZANidine (ZANAFLEX) 2 MG tablet TAKE 1 TABLET BY MOUTH AT BEDTIME AS NEEDED FOR MUSCLE SPASMS 30 tablet 2   traMADol (ULTRAM) 50 MG tablet Take 50 mg by mouth 2 (two) times daily as needed.     traZODone (DESYREL) 100 MG tablet Take 200 mg by mouth at bedtime.      No current facility-administered medications for this visit.    REVIEW OF SYSTEMS:   _0  denotes positive finding, _1  denotes negative finding Cardiac  Comments:  Chest pain or chest pressure:    Shortness of breath upon exertion:    Short of breath when lying flat:    Irregular heart rhythm:        Vascular    Pain in calf, thigh, or hip brought on by ambulation: x   Pain in feet at night that wakes you up from your sleep:  x   Blood clot in your veins:    Leg swelling:         Pulmonary    Oxygen at home:    Productive cough:     Wheezing:         Neurologic    Sudden weakness in arms or legs:     Sudden numbness in arms or legs:     Sudden onset of difficulty speaking or slurred speech:    Temporary loss of vision in one eye:     Problems with dizziness:         Gastrointestinal    Blood in stool:     Vomited blood:         Genitourinary    Burning when urinating:     Blood in urine:        Psychiatric    Major depression:         Hematologic    Bleeding problems:    Problems with blood clotting too easily:        Skin    Rashes or ulcers:        Constitutional    Fever or chills:      PHYSICAL EXAM:  Vitals:   01/24/22 1353  BP: 104/68  Pulse: 93  Resp: 20  Temp: 97.9 F (36.6 C)  SpO2: 94%  Weight: 201 lb (91.2 kg)  Height: _0  (1.651 m)    GENERAL: The patient is a well-nourished  female, in no acute distress. The vital signs are documented above. CARDIAC: There is a regular rate and rhythm.  VASCULAR: Nonpalpable pedal pulses PULMONARY: Non-labored respirations MUSCULOSKELETAL: There are no major deformities or cyanosis. NEUROLOGIC: No focal weakness or paresthesias are detected. SKIN: There are no ulcers or rashes noted. PSYCHIATRIC: The patient has a normal affect.  STUDIES:   I have reviewed the following:  [name]/TBIToday's ABIToday's TBIPrevious ABIPrevious TBI  +-------+-----------+-----------+------------+------------+  Right  0.70       0.34                                 +-------+-----------+-----------+------------+------------+  Left   0.34                                            +-------+-----------+-----------+------------+------------+  Left: Occluded superficial femoral artery to above-knee popliteal stent in  the prox-mid segments with monophasic flow in the distal stent.  Monophasic flow noted in the popliteal artery, PTA, Pero A and ATA.  MEDICAL ISSUES:   Left leg rest pain: The patient's previously placed stent has occluded.  The patient is having severe pain.  She is taking tramadol with minimal relief.  She does not have any active wounds.  I have recommended that we proceed with diagnostic angiography via right femoral approach, imaging both legs next Tuesday, August 22.  I have then scheduled her for left leg bypass on Friday.  I discussed with the patient that I am concerned about the longevity of a bypass [name] her track record and age.  Regardless, because of her symptoms I feel that we need to proceed with revascularization.  I am stopping her Plavix today.    Leia Alf, MD, FACS [facility] [phone] Pager [phone]

## 2022-01-24 NOTE — H&P (View-Only) (Signed)
[facility] [place]  Patient name: Taylor [name] MRN: [id number] DOB: 1981/05/18 Sex: female   REASON FOR VISIT:    Follow-up  HISOTRY OF PRESENT ILLNESS:    Taylor [name] is a 41 y.o. female who has undergone the following procedures:  06/25/2019: Left superficial femoral and popliteal artery atherectomy with drug-coated balloon angioplasty (ulcer) [place]  07/03/2019: Left fourth and fifth ray amputation Sharol Given)  10/22/2019: DCB, left SFA (ulcer), Takashi Korol  04/01/2020: Left SFA DCB, left SFA stent (ulcer), [place]   She is back today complaining of rest pain and short distance claudication.  She states that this has been going on for several months.  She does not have any open wounds.  The patient is a diabetic.  She does have a history of stroke.  She is a former smoker.  She is on dual antiplatelet therapy and a statin for hypercholesterolemia  PAST MEDICAL HISTORY:   Past Medical History:  Diagnosis Date   Anxiety    ARF (acute renal failure) ([place]) 2013   Bipolar disorder ([place])    Cerebral artery occlusion with cerebral infarction ([place]) 11/13   due to elevated blood sugar 1700-off insulin 6 months   Depression    Diabetes mellitus    off insulin 6 months   Diverticulitis    Dyspnea    when walking   GERD (gastroesophageal reflux disease)    Headache(784.0)    Hypertension    Kidney dialysis status    on dialysis(stroke) off after 4-6 weeks   Pneumonia 2013, 06/2018   Respiratory failure ([place]) 11/13   with stroke - in hosp [facility] 12 weeks   Seizures ([place])    during elvated blood sugsr episode 11/13   Sepsis ([place])    Stroke ([place])    11/2018, 12/2018 Weak left side, speech- slurred, Short term memeroy, Gait unsteady    Vocal cord dysfunction      FAMILY HISTORY:   Family History  Problem Relation Age of Onset   Lung cancer Maternal Grandmother    Heart attack Maternal Grandfather    Diabetes Paternal  Grandfather     SOCIAL HISTORY:   Social History   Tobacco Use   Smoking status: Former    Packs/day: 0.60    Years: 23.00    Total pack years: 13.80    Types: Cigarettes    Quit date: 02/05/2019    Years since quitting: 2.9   Smokeless tobacco: Never  Substance Use Topics   Alcohol use: No    Alcohol/week: 0.0 standard drinks of alcohol     ALLERGIES:   Allergies  Allergen Reactions   Contrast Media [Iodinated Contrast Media] Other (See Comments)    2013-had CVA, went into AFR-combination ABT, contrast, low BP   Amitriptyline Other (See Comments)    Unresponsive    Compazine Other (See Comments)    Alert but unable to move   Morphine And Related Other (See Comments)    Chest pain   Oxycodone Nausea And Vomiting    Severe n/v -  patient will not take oxycodone or percocet.   Promethazine Swelling    Lips swell   Adhesive [Tape] Rash    Long term exposure causes skin to tear    Sumatriptan Rash     CURRENT MEDICATIONS:   Current Outpatient Medications  Medication Sig Dispense Refill   aspirin 81 MG EC tablet Take 81 mg by mouth daily.      atorvastatin (LIPITOR) 40 MG  tablet TAKE 1 TABLET BY MOUTH DAILY 90 tablet 2   blood glucose meter kit and supplies Dispense based on patient and insurance preference. Use up to four times daily as directed. (FOR ICD-10 E10.9, E11.9). 1 each 0   clopidogrel (PLAVIX) 75 MG tablet Take 1 tablet (75 mg total) by mouth daily. 90 tablet 3   dapagliflozin propanediol (FARXIGA) 10 MG TABS tablet Take 10 mg by mouth daily.      diazepam (VALIUM) 5 MG tablet Take 5 mg by mouth 2 (two) times daily.      FLUoxetine (PROZAC) 10 MG capsule Take 10 mg by mouth daily.      gabapentin (NEURONTIN) 300 MG capsule TAKE FOUR CAPSULES BY MOUTH THREE TIMES DAILY 1080 capsule 3   labetalol (NORMODYNE) 300 MG tablet Take 300 mg by mouth 2 (two) times daily.     levETIRAcetam (KEPPRA XR) 500 MG 24 hr tablet Take 500 mg by mouth 2 (two) times daily.       lisinopril (ZESTRIL) 40 MG tablet Take 40 mg by mouth daily.      metFORMIN (GLUCOPHAGE) 500 MG tablet Take 500 mg by mouth 2 (two) times daily.     omeprazole (PRILOSEC) 20 MG capsule Take 20 mg by mouth daily.     risperiDONE (RISPERDAL) 0.5 MG tablet Take 0.5 mg by mouth at bedtime.     tiZANidine (ZANAFLEX) 2 MG tablet TAKE 1 TABLET BY MOUTH AT BEDTIME AS NEEDED FOR MUSCLE SPASMS 30 tablet 2   traMADol (ULTRAM) 50 MG tablet Take 50 mg by mouth 2 (two) times daily as needed.     traZODone (DESYREL) 100 MG tablet Take 200 mg by mouth at bedtime.      No current facility-administered medications for this visit.    REVIEW OF SYSTEMS:   _0  denotes positive finding, _1  denotes negative finding Cardiac  Comments:  Chest pain or chest pressure:    Shortness of breath upon exertion:    Short of breath when lying flat:    Irregular heart rhythm:        Vascular    Pain in calf, thigh, or hip brought on by ambulation: x   Pain in feet at night that wakes you up from your sleep:  x   Blood clot in your veins:    Leg swelling:         Pulmonary    Oxygen at home:    Productive cough:     Wheezing:         Neurologic    Sudden weakness in arms or legs:     Sudden numbness in arms or legs:     Sudden onset of difficulty speaking or slurred speech:    Temporary loss of vision in one eye:     Problems with dizziness:         Gastrointestinal    Blood in stool:     Vomited blood:         Genitourinary    Burning when urinating:     Blood in urine:        Psychiatric    Major depression:         Hematologic    Bleeding problems:    Problems with blood clotting too easily:        Skin    Rashes or ulcers:        Constitutional    Fever or chills:      PHYSICAL EXAM:  Vitals:   01/24/22 1353  BP: 104/68  Pulse: 93  Resp: 20  Temp: 97.9 F (36.6 C)  SpO2: 94%  Weight: 201 lb (91.2 kg)  Height: _0  (1.651 m)    GENERAL: The patient is a well-nourished  female, in no acute distress. The vital signs are documented above. CARDIAC: There is a regular rate and rhythm.  VASCULAR: Nonpalpable pedal pulses PULMONARY: Non-labored respirations MUSCULOSKELETAL: There are no major deformities or cyanosis. NEUROLOGIC: No focal weakness or paresthesias are detected. SKIN: There are no ulcers or rashes noted. PSYCHIATRIC: The patient has a normal affect.  STUDIES:   I have reviewed the following:  [name]/TBIToday's ABIToday's TBIPrevious ABIPrevious TBI  +-------+-----------+-----------+------------+------------+  Right  0.70       0.34                                 +-------+-----------+-----------+------------+------------+  Left   0.34                                            +-------+-----------+-----------+------------+------------+  Left: Occluded superficial femoral artery to above-knee popliteal stent in  the prox-mid segments with monophasic flow in the distal stent.  Monophasic flow noted in the popliteal artery, PTA, Pero A and ATA.  MEDICAL ISSUES:   Left leg rest pain: The patient's previously placed stent has occluded.  The patient is having severe pain.  She is taking tramadol with minimal relief.  She does not have any active wounds.  I have recommended that we proceed with diagnostic angiography via right femoral approach, imaging both legs next Tuesday, August 22.  I have then scheduled her for left leg bypass on Friday.  I discussed with the patient that I am concerned about the longevity of a bypass given her track record and age.  Regardless, because of her symptoms I feel that we need to proceed with revascularization.  I am stopping her Plavix today.    Leia Alf, MD, FACS [facility] [phone] Pager [phone]

## 2022-01-25 ENCOUNTER — Other Ambulatory Visit: Payer: Self-pay

## 2022-01-25 DIAGNOSIS — I70223 Atherosclerosis of native arteries of extremities with rest pain, bilateral legs: Secondary | ICD-10-CM

## 2022-02-01 ENCOUNTER — Encounter (HOSPITAL_COMMUNITY): Payer: Self-pay | Admitting: Surgery

## 2022-02-01 ENCOUNTER — Ambulatory Visit (HOSPITAL_BASED_OUTPATIENT_CLINIC_OR_DEPARTMENT_OTHER): Payer: Medicaid Other

## 2022-02-01 ENCOUNTER — Encounter (HOSPITAL_COMMUNITY)
Admission: RE | Disposition: A | Discharge status: Home / Self Care | Payer: Self-pay | Source: Home / Self Care | Attending: Surgery

## 2022-02-01 ENCOUNTER — Other Ambulatory Visit: Payer: Self-pay

## 2022-02-01 ENCOUNTER — Ambulatory Visit (HOSPITAL_COMMUNITY)
Admission: RE | Admit: 2022-02-01 | Discharge: 2022-02-01 | Disposition: A | Discharge status: Home / Self Care | Payer: Medicaid Other | Attending: Surgery | Admitting: Surgery

## 2022-02-01 DIAGNOSIS — Z01810 Encounter for preprocedural cardiovascular examination: Secondary | ICD-10-CM | POA: Diagnosis not present

## 2022-02-01 DIAGNOSIS — I70223 Atherosclerosis of native arteries of extremities with rest pain, bilateral legs: Secondary | ICD-10-CM

## 2022-02-01 DIAGNOSIS — I70222 Atherosclerosis of native arteries of extremities with rest pain, left leg: Secondary | ICD-10-CM

## 2022-02-01 DIAGNOSIS — E1151 Type 2 diabetes mellitus with diabetic peripheral angiopathy without gangrene: Secondary | ICD-10-CM | POA: Diagnosis not present

## 2022-02-01 DIAGNOSIS — Z7902 Long term (current) use of antithrombotics/antiplatelets: Secondary | ICD-10-CM | POA: Insufficient documentation

## 2022-02-01 DIAGNOSIS — Z8673 Personal history of transient ischemic attack (TIA), and cerebral infarction without residual deficits: Secondary | ICD-10-CM | POA: Insufficient documentation

## 2022-02-01 DIAGNOSIS — Z87891 Personal history of nicotine dependence: Secondary | ICD-10-CM | POA: Diagnosis not present

## 2022-02-01 DIAGNOSIS — Z7984 Long term (current) use of oral hypoglycemic drugs: Secondary | ICD-10-CM | POA: Insufficient documentation

## 2022-02-01 DIAGNOSIS — E7800 Pure hypercholesterolemia, unspecified: Secondary | ICD-10-CM | POA: Diagnosis not present

## 2022-02-01 DIAGNOSIS — I739 Peripheral vascular disease, unspecified: Secondary | ICD-10-CM

## 2022-02-01 HISTORY — PX: ABDOMINAL AORTOGRAM W/LOWER EXTREMITY: CATH118223

## 2022-02-01 LAB — GLUCOSE, CAPILLARY: Glucose-Capillary: 150 mg/dL — ABNORMAL HIGH (ref 70–99)

## 2022-02-01 LAB — POCT I-STAT, CHEM 8
BUN: 10 mg/dL (ref 6–20)
Calcium, Ion: 1.18 mmol/L (ref 1.15–1.40)
Chloride: 105 mmol/L (ref 98–111)
Creatinine, Ser: 0.8 mg/dL (ref 0.44–1.00)
Glucose, Bld: 140 mg/dL — ABNORMAL HIGH (ref 70–99)
HCT: 34 % — ABNORMAL LOW (ref 36.0–46.0)
Hemoglobin: 11.6 g/dL — ABNORMAL LOW (ref 12.0–15.0)
Potassium: 3.6 mmol/L (ref 3.5–5.1)
Sodium: 142 mmol/L (ref 135–145)
TCO2: 26 mmol/L (ref 22–32)

## 2022-02-01 LAB — PREGNANCY, URINE: Preg Test, Ur: NEGATIVE

## 2022-02-01 SURGERY — ABDOMINAL AORTOGRAM W/LOWER EXTREMITY
Anesthesia: LOCAL

## 2022-02-01 MED ORDER — METHYLPREDNISOLONE SODIUM SUCC 125 MG IJ SOLR
INTRAMUSCULAR | Status: AC
  Filled 2022-02-01: qty 2

## 2022-02-01 MED ORDER — DIPHENHYDRAMINE HCL 50 MG/ML IJ SOLN
25.0000 mg | INTRAMUSCULAR | Status: AC
  Administered 2022-02-01: 25 mg via INTRAVENOUS

## 2022-02-01 MED ORDER — HEPARIN (PORCINE) IN NACL 1000-0.9 UT/500ML-% IV SOLN
INTRAVENOUS | Status: DC | PRN
  Administered 2022-02-01 (×2): 500 mL

## 2022-02-01 MED ORDER — HYDRALAZINE HCL 20 MG/ML IJ SOLN: 5.0000 mg | INTRAMUSCULAR | Status: DC | PRN

## 2022-02-01 MED ORDER — ACETAMINOPHEN 325 MG PO TABS: 650.0000 mg | ORAL_TABLET | ORAL | Status: DC | PRN

## 2022-02-01 MED ORDER — SODIUM CHLORIDE 0.9 % IV SOLN: INTRAVENOUS | Status: DC

## 2022-02-01 MED ORDER — LABETALOL HCL 5 MG/ML IV SOLN: 10.0000 mg | INTRAVENOUS | Status: DC | PRN

## 2022-02-01 MED ORDER — HYDROCODONE-ACETAMINOPHEN 5-325 MG PO TABS
2.0000 | ORAL_TABLET | Freq: Once | ORAL | Status: AC
  Administered 2022-02-01: 2 via ORAL

## 2022-02-01 MED ORDER — SODIUM CHLORIDE 0.9% FLUSH
3.0000 mL | INTRAVENOUS | Status: DC | PRN
Start: 2022-02-01 — End: 2022-02-01

## 2022-02-01 MED ORDER — HYDROMORPHONE HCL 1 MG/ML IJ SOLN
INTRAMUSCULAR | Status: AC
  Filled 2022-02-01: qty 1

## 2022-02-01 MED ORDER — MIDAZOLAM HCL 2 MG/2ML IJ SOLN
INTRAMUSCULAR | Status: AC
  Filled 2022-02-01: qty 2

## 2022-02-01 MED ORDER — MIDAZOLAM HCL 2 MG/2ML IJ SOLN
INTRAMUSCULAR | Status: DC | PRN
  Administered 2022-02-01: 2 mg via INTRAVENOUS

## 2022-02-01 MED ORDER — FENTANYL CITRATE (PF) 100 MCG/2ML IJ SOLN
INTRAMUSCULAR | Status: AC
  Filled 2022-02-01: qty 2

## 2022-02-01 MED ORDER — HYDROCODONE-ACETAMINOPHEN 5-325 MG PO TABS
ORAL_TABLET | ORAL | Status: AC
  Filled 2022-02-01: qty 2

## 2022-02-01 MED ORDER — IODIXANOL 320 MG/ML IV SOLN
INTRAVENOUS | Status: DC | PRN
  Administered 2022-02-01: 100 mL via INTRA_ARTERIAL

## 2022-02-01 MED ORDER — FENTANYL CITRATE (PF) 100 MCG/2ML IJ SOLN
INTRAMUSCULAR | Status: DC | PRN
  Administered 2022-02-01: 50 ug via INTRAVENOUS

## 2022-02-01 MED ORDER — HYDROCODONE-ACETAMINOPHEN 5-325 MG PO TABS: 1.0000 | ORAL_TABLET | ORAL | 0 refills | Status: DC | PRN

## 2022-02-01 MED ORDER — LIDOCAINE HCL (PF) 1 % IJ SOLN
INTRAMUSCULAR | Status: DC | PRN
  Administered 2022-02-01: 15 mL via INTRADERMAL

## 2022-02-01 MED ORDER — SODIUM CHLORIDE 0.9% FLUSH: 3.0000 mL | Freq: Two times a day (BID) | INTRAVENOUS | Status: DC

## 2022-02-01 MED ORDER — HYDROMORPHONE HCL 1 MG/ML IJ SOLN
1.0000 mg | INTRAMUSCULAR | Status: DC | PRN
  Administered 2022-02-01: 1 mg via INTRAVENOUS

## 2022-02-01 MED ORDER — METHYLPREDNISOLONE SODIUM SUCC 125 MG IJ SOLR
125.0000 mg | INTRAMUSCULAR | Status: AC
  Administered 2022-02-01: 125 mg via INTRAVENOUS

## 2022-02-01 MED ORDER — ONDANSETRON HCL 4 MG/2ML IJ SOLN: 4.0000 mg | Freq: Four times a day (QID) | INTRAMUSCULAR | Status: DC | PRN

## 2022-02-01 MED ORDER — DIPHENHYDRAMINE HCL 50 MG/ML IJ SOLN
INTRAMUSCULAR | Status: AC
  Filled 2022-02-01: qty 1

## 2022-02-01 MED ORDER — SODIUM CHLORIDE 0.9 % IV SOLN: 250.0000 mL | INTRAVENOUS | Status: DC | PRN

## 2022-02-01 MED ORDER — SODIUM CHLORIDE 0.9 % WEIGHT BASED INFUSION: 1.0000 mL/kg/h | INTRAVENOUS | Status: DC

## 2022-02-01 MED ORDER — LIDOCAINE HCL (PF) 1 % IJ SOLN
INTRAMUSCULAR | Status: AC
  Filled 2022-02-01: qty 30

## 2022-02-01 MED ORDER — HEPARIN (PORCINE) IN NACL 1000-0.9 UT/500ML-% IV SOLN
INTRAVENOUS | Status: AC
  Filled 2022-02-01: qty 1000

## 2022-02-01 SURGICAL SUPPLY — 9 items
CATH OMNI FLUSH 5F 65CM (CATHETERS) IMPLANT
KIT MICROPUNCTURE NIT STIFF (SHEATH) IMPLANT
KIT PV (KITS) ×2 IMPLANT
SHEATH PINNACLE 5F 10CM (SHEATH) IMPLANT
SHEATH PROBE COVER 6X72 (BAG) IMPLANT
SYR MEDRAD MARK V 150ML (SYRINGE) IMPLANT
TRANSDUCER W/STOPCOCK (MISCELLANEOUS) ×2 IMPLANT
TRAY PV CATH (CUSTOM PROCEDURE TRAY) ×2 IMPLANT
WIRE BENTSON .035X145CM (WIRE) IMPLANT

## 2022-02-01 NOTE — Op Note (Signed)
    Patient name: Taylor [name] MRN: [id number] DOB: May 29, 1981 Sex: female  02/01/2022 Pre-operative Diagnosis: Left leg rest pain Post-operative diagnosis:  Same Surgeon:  [name] Procedure Performed:  1.  Ultrasound-guided access, right femoral artery  2.  Abdominal aortogram  3.  Bilateral lower extremity runoff  4.  Second-order catheterization  5.  Conscious sedation, 32 minutes    Indications: This is a 41 year old female who has previously undergone percutaneous revascularization which has subsequently occluded and she now has rest pain.  She is here for angiography for surgical planning  Procedure:  The patient was identified in the holding area and taken to room 8.  The patient was then placed supine on the table and prepped and draped in the usual sterile fashion.  A time out was called.  Conscious sedation was administered with the use of IV fentanyl and Versed under continuous physician and nurse monitoring.  Heart rate, blood pressure, and oxygen saturation were continuously monitored.  Total sedation time was 32 minutes.  Ultrasound was used to evaluate the right common femoral artery.  It was patent .  A digital ultrasound image was acquired.  A micropuncture needle was used to access the right common femoral artery under ultrasound guidance.  An 018 wire was advanced without resistance and a micropuncture sheath was placed.  The 018 wire was removed and a benson wire was placed.  The micropuncture sheath was exchanged for a 5 french sheath.  An omniflush catheter was advanced over the wire to the level of L-1.  An abdominal angiogram was obtained.  Next, using the omniflush catheter and a benson wire, the aortic bifurcation was crossed and the catheter was placed into theleft external iliac artery and left runoff was obtained.  right runoff was performed via retrograde sheath injections.  Findings:   Aortogram: No significant renal artery stenosis was identified.  The  infrarenal abdominal aorta is small in caliber patent without stenosis.  Bilateral common and external neck arteries are patent without significant stenosis  Right Lower Extremity: The right common femoral, profundofemoral artery are widely patent.  The superficial femoral artery is diffusely diseased with several areas of high-grade stenosis, greater than 80%.  The popliteal artery is widely patent with three-vessel runoff  Left Lower Extremity: The left common femoral profundofemoral artery are widely patent without stenosis.  The superficial femoral artery is occluded.  There is reconstitution of the popliteal artery at the level of the patella.  The below-knee popliteal artery is patent without stenosis but small in caliber with three-vessel runoff  Intervention:  none  Impression:  #1  No significant aortoiliac occlusive disease  #2  Occluded left superficial femoral artery and associated stents with reconstitution of the left popliteal artery at the level of the patella with three-vessel runoff  #3  The right superficial femoral artery has multiple areas of stenosis, some greater than 80%.  There is three-vessel runoff  #4  Patient be scheduled for left femoral-popliteal bypass    V. [name], M.D., [facility] [place] Office: [phone] Pager:  [phone]

## 2022-02-01 NOTE — Progress Notes (Signed)
Bilateral lower extremity vein mapping completed. Refer to "CV Proc" under chart review to view preliminary results.  02/01/2022 1:12 PM Kelby Aline., MHA, RVT, RDCS, RDMS

## 2022-02-01 NOTE — Progress Notes (Signed)
Notified Anderson Malta that patient had stopped plavix on 8/14 but by accident took 1 plavix last evening.  Mother of patient stated she called Dr. Aleen Campi office and they said it was okay. Per Anderson Malta we are okay to proceed.

## 2022-02-01 NOTE — Interval H&P Note (Signed)
History and Physical Interval Note:  02/01/2022 7:31 AM  Taylor [name]  has presented today for surgery, with the diagnosis of rest pain.  The various methods of treatment have been discussed with the patient and family. After consideration of risks, benefits and other options for treatment, the patient has consented to  Procedure(s): ABDOMINAL AORTOGRAM W/LOWER EXTREMITY (N/A) as a surgical intervention.  The patient's history has been reviewed, patient examined, no change in status, stable for surgery.  I have reviewed the patient's chart and labs.  Questions were answered to the patient's satisfaction.     [name]

## 2022-02-01 NOTE — Progress Notes (Signed)
Site area: Right groin a 5 french arterial sheath was removed by Gerarda Gunther RN 2H  Site Prior to Removal:  Level 0  Pressure Applied For 20 MINUTES    Bedrest Beginning at 0920am X 4 hours  Manual:   Yes.    Patient Status During Pull:  stable  Post Pull Groin Site:  Level 0  Post Pull Instructions Given:  Yes.    Post Pull Pulses Present:  Yes.    Dressing Applied:  Yes.    Comments:

## 2022-02-03 ENCOUNTER — Other Ambulatory Visit: Payer: Self-pay

## 2022-02-03 ENCOUNTER — Encounter (HOSPITAL_COMMUNITY): Payer: Self-pay | Admitting: Surgery

## 2022-02-03 NOTE — Anesthesia Preprocedure Evaluation (Addendum)
Anesthesia Evaluation  Patient identified by MRN, date of birth, ID band Patient awake    Reviewed: Allergy & Precautions, NPO status , Patient's Chart, lab work & pertinent test results, reviewed documented beta blocker date and time   Airway Mallampati: III  TM Distance: >3 FB Neck ROM: Full    Dental  (+) Edentulous Upper, Edentulous Lower   Pulmonary former smoker,  resp failure requiring temporary trach, vocal cord dysfunction 2013 s/p CVA  Quit smoking 2020, 15 pack year history    Pulmonary exam normal breath sounds clear to auscultation       Cardiovascular hypertension, Pt. on medications and Pt. on home beta blockers + Peripheral Vascular Disease and +CHF (diastolic dysfunction on echo 2020)  Normal cardiovascular exam Rhythm:Regular Rate:Normal  Echo with Bubble study 12/03/18: IMPRESSIONS    1. The left ventricle has a visually estimated ejection fraction of 55%.  The cavity size was normal. There is mild concentric left ventricular  hypertrophy. Diastolic dysfunction, grade indeterminate. Elevated left  ventricular end-diastolic pressure.  2. The right ventricle has normal systolic function. The cavity was  normal. There is no increase in right ventricular wall thickness.  3. The mitral valve is grossly normal. There is mild mitral annular  calcification present.  4. The tricuspid valve is grossly normal.  5. The aortic valve is tricuspid.  6. The aortic root is normal in size and structure.  7. No atrial level shunt detected by color flow Doppler. Agitated saline  contrast was given intravenously to evaluate for intracardiac shunting.  Saline contrast bubble study was negative, with no evidence of any  interatrial shunt.    Neuro/Psych  Headaches, PSYCHIATRIC DISORDERS Anxiety Depression Bipolar Disorder CVA (2013, 2020), Residual Symptoms    GI/Hepatic Neg liver ROS, GERD  Medicated and Controlled,   Endo/Other  diabetes, Well Controlled, Type 2, Oral Hypoglycemic AgentsObesity BMI 34  Renal/GU negative Renal ROS  negative genitourinary   Musculoskeletal negative musculoskeletal ROS (+)   Abdominal (+) + obese,   Peds  Hematology  (+) Blood dyscrasia, anemia , Hb 11.6   Anesthesia Other Findings   Reproductive/Obstetrics negative OB ROS urine preg neg                           Anesthesia Physical Anesthesia Plan  ASA: 3  Anesthesia Plan: General   Post-op Pain Management: Ofirmev IV (intra-op)*   Induction: Intravenous  PONV Risk Score and Plan: 3 and Ondansetron, Midazolam and Treatment may vary due to age or medical condition  Airway Management Planned: Oral ETT  Additional Equipment: Arterial line  Intra-op Plan:   Post-operative Plan: Extubation in OR  Informed Consent: I have reviewed the patients History and Physical, chart, labs and discussed the procedure including the risks, benefits and alternatives for the proposed anesthesia with the patient or authorized representative who has indicated his/her understanding and acceptance.     Dental advisory given  Plan Discussed with: CRNA  Anesthesia Plan Comments: ( )      Anesthesia Quick Evaluation

## 2022-02-03 NOTE — Progress Notes (Signed)
Anesthesia Chart Review: Taylor [name]   Case: [id number] Date/Time: 02/04/22 0715   Procedure: LEFT FEMORAL-POPLITEAL ARTERY BYPASS (Left)   Anesthesia type: General   Pre-op diagnosis: Atherosclerosis of native artery of both lower extremities with rest pain   Location: [facility] OR ROOM 12 / [place] [facility]   Surgeons: Serafina Mitchell, MD       DISCUSSION: Patient is a 41 year old female scheduled for the above procedure. She has had multiple LLE percutaneous procedures for PAD, last with left SFA & above knee popliteal artery stents in 03/2020 that have since occluded. She is now having rest pain with minimal relief with tramadol. Above procedure recommended following arteriogram results from 02/01/22.   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 She had a temporary tracheostomy and underwent short term hemodialysis for acute renal failure during a prolonged hospitalization in 04/2012 in setting of retroperitoneal abscess and acute right PCA infarction.   Currently, no A1c available. Reportedly home fasting CBGs ~ 112-116.   Last Plavix reported as 01/24/22. Continue ASA per [facility]. No reported chest pain or SOB.   Anesthesia team to evaluate on the day of surgery. Labs on arrival as indicated. Mother said she was the [facility] and will bring documents on the day of surgery--patient reportedly with memory issues given her stroke  history.    VS: LMP 01/28/2022 Comment: "irregular" per mom- BP Readings from Last 3 Encounters:  02/01/22 103/60  01/24/22 104/68  04/28/20 103/63   Pulse Readings from Last 3 Encounters:  02/01/22 88  01/24/22 93  04/28/20 91     PROVIDERS: Monico Blitz, MD is PCP  Narda Amber, DO is neurologist   LABS: For day of surgery. Last results in [facility] include: Lab Results  Component Value Date   HGB 11.6 (L) 02/01/2022   HCT 34.0 (L) 02/01/2022   GLUCOSE 140 (H) 02/01/2022   NA 142 02/01/2022   K 3.6 02/01/2022   CL 105 02/01/2022   CREATININE 0.80 02/01/2022   BUN 10 02/01/2022     IMAGES: Xray left ribs w/ PA Chest 08/02/21 [facility]): Impression: Negative radiographs of the chest and left ribs.    CTA Head/Neck 11/04/19: IMPRESSION: 1. No acute intracranial abnormality.  Chronic right PCA infarct. 2. Stenting of the proximal left vertebral artery which appears patent. Moderate stenosis distal left vertebral artery due to atherosclerotic disease. Right vertebral artery widely patent 3. Atherosclerotic disease right carotid bifurcation without significant stenosis 4. Atherosclerotic calcification left carotid bifurcation with 75% diameter stenosis proximal left internal carotid artery 5. Moderate stenosis in the cavernous carotid bilaterally due to atherosclerotic disease 6. Small caliber right PCA due to chronic infarction. Moderate stenosis left P2 segment.    EKG: 02/01/22: Sinus rhythm with short PR [PR 110 ms] Low voltage QRS Inferior infarct , age undetermined Abnormal ECG No significant change since last tracing Confirmed by Camnitz, Will [phone]) on 02/01/2022 8:18:36 PM   CV: Aortogram with BLE runoff 02/01/22: Impression: #1  No significant aortoiliac occlusive disease #2  Occluded left superficial femoral artery and  associated stents with reconstitution of the left popliteal artery at the level of the patella with three-vessel runoff #3  The right  superficial femoral artery has multiple areas of stenosis, some greater than 80%.  There is three-vessel runoff #4  Patient be scheduled for left femoral-popliteal bypass    Event monitor 12/12/18-01/10/19: Predominantly sinus rhythm. No arrhythmias.   Echo with Bubble study 12/03/18: IMPRESSIONS     1. The left ventricle has a visually estimated ejection fraction of 55%.  The cavity size was normal. There is mild concentric left ventricular  hypertrophy. Diastolic dysfunction, grade indeterminate. Elevated left  ventricular end-diastolic pressure.   2. The right ventricle has normal systolic function. The cavity was  normal. There is no increase in right ventricular wall thickness.   3. The mitral valve is grossly normal. There is mild mitral annular  calcification present.   4. The tricuspid valve is grossly normal.   5. The aortic valve is tricuspid.   6. The aortic root is normal in size and structure.   7. No atrial level shunt detected by color flow Doppler. Agitated saline  contrast was given intravenously to evaluate for intracardiac shunting.  Saline contrast bubble study was negative, with no evidence of any  interatrial shunt.    Past Medical History:  Diagnosis Date   Anxiety    ARF (acute renal failure) ([place]) 2013   Bipolar disorder ([place])    Cerebral artery occlusion with cerebral infarction ([place]) 11/13   due to elevated blood sugar 1700-off insulin 6 months   Depression    Diabetes mellitus    off insulin 6 months   Diverticulitis    Dyspnea    when walking   GERD (gastroesophageal reflux disease)    Headache(784.0)    Hypertension    Kidney dialysis status    on dialysis(stroke) off after 4-6 weeks   Pneumonia 2013, 06/2018   Respiratory failure ([place]) 11/13   with stroke - in hosp [facility] 12 weeks   Seizures ([place])    during elvated blood sugsr episode 11/13   Sepsis ([place])    Stroke ([place])    11/2018, 12/2018 Weak left side, speech- slurred, Short term memeroy,  Gait unsteady    Vocal cord dysfunction     Past Surgical History:  Procedure Laterality Date   ABDOMINAL AORTOGRAM W/LOWER EXTREMITY N/A 06/25/2019   Procedure: ABDOMINAL AORTOGRAM W/LOWER EXTREMITY;  Surgeon: Serafina Mitchell, MD;  Location: [place] [facility];  Service: Cardiovascular;  Laterality: N/A;   ABDOMINAL AORTOGRAM W/LOWER EXTREMITY N/A 10/22/2019   Procedure: ABDOMINAL AORTOGRAM W/LOWER EXTREMITY;  Surgeon: Serafina Mitchell, MD;  Location: [place] [facility];  Service: Cardiovascular;  Laterality: N/A;   ABDOMINAL AORTOGRAM W/LOWER EXTREMITY Left 04/01/2020   Procedure: ABDOMINAL AORTOGRAM W/LOWER EXTREMITY;  Surgeon: [name], MD;  Location: [place] [facility];  Service: Cardiovascular;  Laterality: Left;   ABDOMINAL AORTOGRAM W/LOWER EXTREMITY N/A 02/01/2022   Procedure: ABDOMINAL AORTOGRAM W/LOWER EXTREMITY;  Surgeon: Serafina Mitchell, MD;  Location: [place] [facility];  Service: Cardiovascular;  Laterality: N/A;   AMPUTATION Left 07/03/2019   Procedure: LEFT FOOT 4TH AND 5TH RAY AMPUTATION;  Surgeon: Newt Minion, MD;  Location: [place];  Service: Orthopedics;  Laterality: Left;   APPENDECTOMY     INSERTION OF DIALYSIS CATHETER  11/13   removed in 4-6 weeks   IR ANGIO INTRA EXTRACRAN SEL COM CAROTID INNOMINATE BILAT MOD SED  02/07/2019   IR ANGIO VERTEBRAL SEL  VERTEBRAL BILAT MOD SED  02/07/2019   IR ANGIOGRAM EXTREMITY LEFT  02/07/2019   IR ANGIOGRAM EXTREMITY RIGHT  02/20/2019   IR TRANSCATH [employer] VERT OR CAR A STENT  02/20/2019   IR TRANSCATH EXCRAN VERT OR CAR A STENT  03/25/2019   IR US GUIDE VASC ACCESS RIGHT  02/07/2019   MULTIPLE EXTRACTIONS WITH ALVEOLOPLASTY N/A 02/24/2014   Procedure: MULTIPLE EXTRACTION WITH ALVEOLOPLASTY AND BIOPSY;  Surgeon: Gae Bon, DDS;  Location: [place];  Service: Oral Surgery;  Laterality: N/A;   PERIPHERAL VASCULAR ATHERECTOMY Left 06/25/2019   Procedure: PERIPHERAL VASCULAR ATHERECTOMY;  Surgeon: Serafina Mitchell, MD;  Location:  [place] [facility];  Service: Cardiovascular;  Laterality: Left;  superficial femoral   PERIPHERAL VASCULAR BALLOON ANGIOPLASTY Left 10/22/2019   Procedure: PERIPHERAL VASCULAR BALLOON ANGIOPLASTY;  Surgeon: Serafina Mitchell, MD;  Location: [place] CV [facility];  Service: Cardiovascular;  Laterality: Left;  superficial femoral   PERIPHERAL VASCULAR INTERVENTION  04/01/2020   Procedure: PERIPHERAL VASCULAR INTERVENTION;  Surgeon: [name], MD;  Location: [place] [facility];  Service: Cardiovascular;;  left SFA stent    RADIOLOGY WITH ANESTHESIA N/A 02/20/2019   Procedure: RADIOLOGY WITH ANESTHESIA   STENTING;  Surgeon: Luanne Bras, MD;  Location: [place];  Service: Radiology;  Laterality: N/A;   RADIOLOGY WITH ANESTHESIA N/A 03/25/2019   Procedure: RADIOLOGY WITH ANESTHESIA STENTING;  Surgeon: Luanne Bras, MD;  Location: [place];  Service: Radiology;  Laterality: N/A;   SKIN SPLIT GRAFT Left 07/03/2019   Procedure: APPLY SKIN GRAFT LEFT FOOT;  Surgeon: Newt Minion, MD;  Location: [place];  Service: Orthopedics;  Laterality: Left;   TRACHEOSTOMY  11/13   closed 1/14   TRANSRECTAL DRAINAGE OF PELVIC ABSCESS  12/11   VASCULAR SURGERY  04/01/2020   two stents placed in left leg    MEDICATIONS: No current facility-administered medications for this encounter.    aspirin 81 MG EC tablet   atorvastatin (LIPITOR) 40 MG tablet   blood glucose meter kit and supplies   dapagliflozin propanediol (FARXIGA) 10 MG TABS tablet   diazepam (VALIUM) 5 MG tablet   ferrous gluconate (FERGON) 324 MG tablet   FLUoxetine (PROZAC) 10 MG capsule   gabapentin (NEURONTIN) 300 MG capsule   HYDROcodone-acetaminophen (NORCO/VICODIN) 5-325 MG tablet   labetalol (NORMODYNE) 300 MG tablet   levETIRAcetam (KEPPRA XR) 500 MG 24 hr tablet   lisinopril (ZESTRIL) 40 MG tablet   metFORMIN (GLUCOPHAGE) 500 MG tablet   methocarbamol (ROBAXIN) 500 MG tablet   omeprazole (PRILOSEC) 20 MG capsule    risperiDONE (RISPERDAL) 0.5 MG tablet   tiZANidine (ZANAFLEX) 2 MG tablet   traMADol (ULTRAM) 50 MG tablet   traZODone (DESYREL) 100 MG tablet    Myra Gianotti, PA-C Surgical Short Stay/Anesthesiology [facility] Phone [phone] [facility] Phone [phone] 02/03/2022 11:35 AM

## 2022-02-03 NOTE — Pre-Procedure Instructions (Signed)
SDW CALL  Spoke with patient's mother for pre-op call. Per mother she is HCPOA and will try to bring documentation with her tomorrow. Mother states that patient has memory issues and does not sign documents for herself. Patient's mother was given pre-op instructions over the phone. The opportunity was given for the patient's mother to ask questions. No further questions asked. Patient's mother verbalized understanding of instructions given.   PCP - Monico Blitz Cardiologist - denies  PPM/ICD - denies  Chest x-ray - N/A EKG - 12/02/21 Stress Test - denies ECHO - 8/22/2 Cardiac Cath - denies  Sleep Study - denies  Fasting Blood Sugar - typically 112-116  Blood Thinner Instructions: last dose Plavix 8/14 per mother Aspirin Instructions: continue ASA  ERAS Protcol - NPO order   COVID TEST- N/A   Anesthesia review: yes, medical hx   Mother denies that patient is having any shortness of breath, fever, cough and chest pain over the phone call    Surgical Instructions    Your procedure is scheduled on Friday, August 25  Report to [facility] Main Entrance "A" at 5:30 A.M., then check in with the Admitting office.  Call this number if you have problems the morning of surgery:  [phone]    Remember:  Do not or drink eat after midnight the night before your surgery    Take these medicines the morning of surgery with A SIP OF WATER: Aspirin, atorvastatin, diazepam, prozac, gabapentin, labetalol, keppra, prilosec, Robaxin, tramadol  Follow your surgeon's instructions on when to stop Plavix.  If no instructions were given by your surgeon then you will need to call the office to get those instructions.    As of today, STOP taking any Aleve, Naproxen, Ibuprofen, Motrin, Advil, Goody's, BC's, all herbal medications, fish oil, and all vitamins.  Do not take Metformin the day of surgery.  Do not take Farxiga on 02/03/22 or 02/04/22   Check your blood sugar the morning of your  surgery when you wake up and every 2 hours until you get to the Short Stay unit.  If your blood sugar is less than 70 mg/dL, you will need to treat for low blood sugar: Do not take insulin. Treat a low blood sugar (less than 70 mg/dL) with  cup of clear juice (cranberry or apple), 4 glucose tablets, OR glucose gel. Recheck blood sugar in 15 minutes after treatment (to make sure it is greater than 70 mg/dL). If your blood sugar is not greater than 70 mg/dL on recheck, call [phone] for further instructions.   [facility] is not responsible for any belongings or valuables.    Contacts, glasses, hearing aids, dentures or partials may not be worn into surgery, please bring cases for these belongings   Patients discharged the day of surgery will not be allowed to drive home, and someone needs to stay with them for 24 hours.   SURGICAL WAITING ROOM VISITATION You may have 1 visitor in the pre-op area at a time determined by the pre-op nurse. (Visitor may not switch out) Patients having surgery or a procedure in a hospital may have two support people in the waiting room. Children under the age of 36 must have an adult with them who is not the patient. They may stay in the waiting area during the procedure and may switch out with other visitors. If the patient needs to stay at the hospital during part of their recovery, the visitor guidelines for inpatient rooms apply.  Please  refer to the [facility] website for the visitor guidelines for Inpatients (after your surgery is over and you are in a regular room).     Special instructions:    Oral Hygiene is also important to reduce your risk of infection.  Remember - BRUSH YOUR TEETH THE MORNING OF SURGERY WITH YOUR REGULAR TOOTHPASTE   Day of Surgery:  Take a shower the day of or night before with antibacterial soap. Wear Clean/Comfortable clothing the morning of surgery Do not apply any deodorants/lotions.   Do not wear jewelry or  makeup Do not wear lotions, powders, perfumes/colognes, or deodorant. Do not shave 48 hours prior to surgery.   Do not bring valuables to the hospital. Do not wear nail polish, gel polish, artificial nails, or any other type of covering on natural nails (fingers and toes) If you have artificial nails or gel coating that need to be removed by a nail salon, please have this removed prior to surgery. Artificial nails or gel coating may interfere with anesthesia's ability to adequately monitor your vital signs. Remember to brush your teeth WITH YOUR REGULAR TOOTHPASTE.

## 2022-02-04 ENCOUNTER — Other Ambulatory Visit: Payer: Self-pay

## 2022-02-04 ENCOUNTER — Encounter (HOSPITAL_COMMUNITY): Payer: Self-pay | Admitting: Surgery

## 2022-02-04 ENCOUNTER — Encounter (HOSPITAL_COMMUNITY)
Admission: RE | Disposition: A | Discharge status: Home / Self Care | Payer: Self-pay | Source: Home / Self Care | Attending: Surgery

## 2022-02-04 ENCOUNTER — Inpatient Hospital Stay (HOSPITAL_COMMUNITY)
Admission: RE | Admit: 2022-02-04 | Discharge: 2022-02-09 | DRG: 254 | Disposition: A | Discharge status: Home / Self Care | Payer: Medicaid Other | Attending: Surgery | Admitting: Surgery

## 2022-02-04 ENCOUNTER — Inpatient Hospital Stay (HOSPITAL_COMMUNITY): Payer: Medicaid Other | Admitting: Vascular Surgery

## 2022-02-04 DIAGNOSIS — Z888 Allergy status to other drugs, medicaments and biological substances status: Secondary | ICD-10-CM | POA: Diagnosis not present

## 2022-02-04 DIAGNOSIS — Z833 Family history of diabetes mellitus: Secondary | ICD-10-CM

## 2022-02-04 DIAGNOSIS — Z79899 Other long term (current) drug therapy: Secondary | ICD-10-CM | POA: Diagnosis not present

## 2022-02-04 DIAGNOSIS — I739 Peripheral vascular disease, unspecified: Secondary | ICD-10-CM | POA: Diagnosis present

## 2022-02-04 DIAGNOSIS — K219 Gastro-esophageal reflux disease without esophagitis: Secondary | ICD-10-CM | POA: Diagnosis present

## 2022-02-04 DIAGNOSIS — Z7902 Long term (current) use of antithrombotics/antiplatelets: Secondary | ICD-10-CM

## 2022-02-04 DIAGNOSIS — I110 Hypertensive heart disease with heart failure: Secondary | ICD-10-CM | POA: Diagnosis not present

## 2022-02-04 DIAGNOSIS — Y718 Miscellaneous cardiovascular devices associated with adverse incidents, not elsewhere classified: Secondary | ICD-10-CM | POA: Diagnosis present

## 2022-02-04 DIAGNOSIS — E7800 Pure hypercholesterolemia, unspecified: Secondary | ICD-10-CM | POA: Diagnosis present

## 2022-02-04 DIAGNOSIS — Z87891 Personal history of nicotine dependence: Secondary | ICD-10-CM

## 2022-02-04 DIAGNOSIS — Z801 Family history of malignant neoplasm of trachea, bronchus and lung: Secondary | ICD-10-CM | POA: Diagnosis not present

## 2022-02-04 DIAGNOSIS — Z885 Allergy status to narcotic agent status: Secondary | ICD-10-CM | POA: Diagnosis not present

## 2022-02-04 DIAGNOSIS — I70223 Atherosclerosis of native arteries of extremities with rest pain, bilateral legs: Secondary | ICD-10-CM | POA: Diagnosis not present

## 2022-02-04 DIAGNOSIS — Z8249 Family history of ischemic heart disease and other diseases of the circulatory system: Secondary | ICD-10-CM | POA: Diagnosis not present

## 2022-02-04 DIAGNOSIS — Z89422 Acquired absence of other left toe(s): Secondary | ICD-10-CM

## 2022-02-04 DIAGNOSIS — E1151 Type 2 diabetes mellitus with diabetic peripheral angiopathy without gangrene: Secondary | ICD-10-CM

## 2022-02-04 DIAGNOSIS — I70222 Atherosclerosis of native arteries of extremities with rest pain, left leg: Secondary | ICD-10-CM | POA: Diagnosis not present

## 2022-02-04 DIAGNOSIS — Z8673 Personal history of transient ischemic attack (TIA), and cerebral infarction without residual deficits: Secondary | ICD-10-CM | POA: Diagnosis not present

## 2022-02-04 DIAGNOSIS — I70213 Atherosclerosis of native arteries of extremities with intermittent claudication, bilateral legs: Secondary | ICD-10-CM | POA: Diagnosis present

## 2022-02-04 DIAGNOSIS — I509 Heart failure, unspecified: Secondary | ICD-10-CM | POA: Diagnosis not present

## 2022-02-04 DIAGNOSIS — F319 Bipolar disorder, unspecified: Secondary | ICD-10-CM | POA: Diagnosis present

## 2022-02-04 DIAGNOSIS — Z91041 Radiographic dye allergy status: Secondary | ICD-10-CM | POA: Diagnosis not present

## 2022-02-04 DIAGNOSIS — Z7984 Long term (current) use of oral hypoglycemic drugs: Secondary | ICD-10-CM | POA: Diagnosis not present

## 2022-02-04 DIAGNOSIS — Z7982 Long term (current) use of aspirin: Secondary | ICD-10-CM | POA: Diagnosis not present

## 2022-02-04 DIAGNOSIS — T82856A Stenosis of peripheral vascular stent, initial encounter: Principal | ICD-10-CM | POA: Diagnosis present

## 2022-02-04 HISTORY — PX: FEMORAL-POPLITEAL BYPASS GRAFT: SHX937

## 2022-02-04 LAB — URINALYSIS, ROUTINE W REFLEX MICROSCOPIC
Bilirubin Urine: NEGATIVE
Glucose, UA: >=500 mg/dL — AB
Hgb urine dipstick: NEGATIVE
Ketones, ur: NEGATIVE mg/dL
Leukocytes,Ua: NEGATIVE
Nitrite: NEGATIVE
Protein, ur: NEGATIVE mg/dL
Specific Gravity, Urine: 1.02 (ref 1.005–1.030)
pH: 6 (ref 5.0–8.0)

## 2022-02-04 LAB — GLUCOSE, CAPILLARY
Glucose-Capillary: 172 mg/dL — ABNORMAL HIGH (ref 70–99)
Glucose-Capillary: 209 mg/dL — ABNORMAL HIGH (ref 70–99)
Glucose-Capillary: 291 mg/dL — ABNORMAL HIGH (ref 70–99)
Glucose-Capillary: 221 mg/dL — ABNORMAL HIGH (ref 70–99)

## 2022-02-04 LAB — CBC
HCT: 37.9 % (ref 36.0–46.0)
Hemoglobin: 11.4 g/dL — ABNORMAL LOW (ref 12.0–15.0)
MCH: 23.9 pg — ABNORMAL LOW (ref 26.0–34.0)
MCHC: 30.1 g/dL (ref 30.0–36.0)
MCV: 79.5 fL — ABNORMAL LOW (ref 80.0–100.0)
Platelets: 196 10*3/uL (ref 150–400)
RBC: 4.77 MIL/uL (ref 3.87–5.11)
RDW: 16.1 % — ABNORMAL HIGH (ref 11.5–15.5)
WBC: 9.5 10*3/uL (ref 4.0–10.5)
nRBC: 0 % (ref 0.0–0.2)

## 2022-02-04 LAB — COMPREHENSIVE METABOLIC PANEL
ALT: 19 U/L (ref 0–44)
AST: 20 U/L (ref 15–41)
Albumin: 3.5 g/dL (ref 3.5–5.0)
Alkaline Phosphatase: 64 U/L (ref 38–126)
Anion gap: 7 (ref 5–15)
BUN: 11 mg/dL (ref 6–20)
CO2: 25 mmol/L (ref 22–32)
Calcium: 9.1 mg/dL (ref 8.9–10.3)
Chloride: 107 mmol/L (ref 98–111)
Creatinine, Ser: 0.81 mg/dL (ref 0.44–1.00)
GFR, Estimated: >60 mL/min (ref >60)
Glucose, Bld: 168 mg/dL — ABNORMAL HIGH (ref 70–99)
Potassium: 4.2 mmol/L (ref 3.5–5.1)
Sodium: 139 mmol/L (ref 135–145)
Total Bilirubin: 0.4 mg/dL (ref 0.3–1.2)
Total Protein: 6.2 g/dL — ABNORMAL LOW (ref 6.5–8.1)

## 2022-02-04 LAB — SURGICAL PCR SCREEN
MRSA, PCR: NEGATIVE
Staphylococcus aureus: POSITIVE — AB

## 2022-02-04 LAB — TYPE AND SCREEN
ABO/RH(D): A POS
Antibody Screen: NEGATIVE

## 2022-02-04 LAB — URINALYSIS, MICROSCOPIC (REFLEX)
Bacteria, UA: NONE SEEN
RBC / HPF: NONE SEEN RBC/hpf (ref 0–5)
WBC, UA: NONE SEEN WBC/hpf (ref 0–5)

## 2022-02-04 LAB — APTT: aPTT: 25 seconds (ref 24–36)

## 2022-02-04 LAB — HEMOGLOBIN A1C
Hgb A1c MFr Bld: 6.9 % — ABNORMAL HIGH (ref 4.8–5.6)
Mean Plasma Glucose: 151.33 mg/dL

## 2022-02-04 LAB — PROTIME-INR
INR: 1 (ref 0.8–1.2)
Prothrombin Time: 13.4 seconds (ref 11.4–15.2)

## 2022-02-04 LAB — POCT ACTIVATED CLOTTING TIME: Activated Clotting Time: 233 seconds

## 2022-02-04 LAB — ABO/RH: ABO/RH(D): A POS

## 2022-02-04 LAB — POCT PREGNANCY, URINE: Preg Test, Ur: NEGATIVE

## 2022-02-04 SURGERY — BYPASS GRAFT FEMORAL-POPLITEAL ARTERY
Anesthesia: General | Site: Leg Upper | Laterality: Left

## 2022-02-04 MED ORDER — FENTANYL CITRATE (PF) 250 MCG/5ML IJ SOLN
INTRAMUSCULAR | Status: DC | PRN
Start: 2022-02-04 — End: 2022-02-04
  Administered 2022-02-04 (×2): 50 ug via INTRAVENOUS
  Administered 2022-02-04: 100 ug via INTRAVENOUS

## 2022-02-04 MED ORDER — HYDRALAZINE HCL 20 MG/ML IJ SOLN: 5.0000 mg | INTRAMUSCULAR | Status: DC | PRN

## 2022-02-04 MED ORDER — AMISULPRIDE (ANTIEMETIC) 5 MG/2ML IV SOLN: 10.0000 mg | Freq: Once | INTRAVENOUS | Status: DC | PRN

## 2022-02-04 MED ORDER — PHENYLEPHRINE 80 MCG/ML (10ML) SYRINGE FOR IV PUSH (FOR BLOOD PRESSURE SUPPORT)
PREFILLED_SYRINGE | INTRAVENOUS | Status: AC
  Filled 2022-02-04: qty 10

## 2022-02-04 MED ORDER — DEXAMETHASONE SODIUM PHOSPHATE 10 MG/ML IJ SOLN
INTRAMUSCULAR | Status: AC
  Filled 2022-02-04: qty 1

## 2022-02-04 MED ORDER — ATORVASTATIN CALCIUM 40 MG PO TABS: 40.0000 mg | ORAL_TABLET | Freq: Every day | ORAL | Status: DC

## 2022-02-04 MED ORDER — TIZANIDINE HCL 4 MG PO TABS
2.0000 mg | ORAL_TABLET | Freq: Every evening | ORAL | Status: DC | PRN
  Administered 2022-02-05 – 2022-02-06 (×3): 2 mg via ORAL
  Filled 2022-02-04 (×3): qty 1

## 2022-02-04 MED ORDER — ONDANSETRON HCL 4 MG/2ML IJ SOLN: 4.0000 mg | Freq: Once | INTRAMUSCULAR | Status: DC | PRN

## 2022-02-04 MED ORDER — PROPOFOL 10 MG/ML IV BOLUS
INTRAVENOUS | Status: DC | PRN
  Administered 2022-02-04: 150 mg via INTRAVENOUS

## 2022-02-04 MED ORDER — RISPERIDONE 0.5 MG PO TABS
0.5000 mg | ORAL_TABLET | Freq: Every day | ORAL | Status: DC
  Administered 2022-02-04 – 2022-02-08 (×5): 0.5 mg via ORAL
  Filled 2022-02-04 (×5): qty 1

## 2022-02-04 MED ORDER — LEVETIRACETAM ER 500 MG PO TB24
1000.0000 mg | ORAL_TABLET | Freq: Every day | ORAL | Status: DC
  Administered 2022-02-04 – 2022-02-09 (×6): 1000 mg via ORAL
  Filled 2022-02-04 (×6): qty 2

## 2022-02-04 MED ORDER — HEPARIN 6000 UNIT IRRIGATION SOLUTION
Status: AC
  Filled 2022-02-04: qty 500

## 2022-02-04 MED ORDER — ROCURONIUM BROMIDE 10 MG/ML (PF) SYRINGE
PREFILLED_SYRINGE | INTRAVENOUS | Status: DC | PRN
  Administered 2022-02-04: 100 mg via INTRAVENOUS

## 2022-02-04 MED ORDER — TRAMADOL HCL 50 MG PO TABS
50.0000 mg | ORAL_TABLET | Freq: Four times a day (QID) | ORAL | Status: DC | PRN
  Administered 2022-02-04 – 2022-02-08 (×5): 50 mg via ORAL
  Filled 2022-02-04 (×5): qty 1

## 2022-02-04 MED ORDER — CHLORHEXIDINE GLUCONATE CLOTH 2 % EX PADS: 6.0000 | MEDICATED_PAD | Freq: Once | CUTANEOUS | Status: DC

## 2022-02-04 MED ORDER — HYDROMORPHONE HCL 1 MG/ML IJ SOLN
INTRAMUSCULAR | Status: AC
  Filled 2022-02-04: qty 1

## 2022-02-04 MED ORDER — ORAL CARE MOUTH RINSE: 15.0000 mL | Freq: Once | OROMUCOSAL | Status: AC

## 2022-02-04 MED ORDER — SURGIFLO WITH THROMBIN (HEMOSTATIC MATRIX KIT) OPTIME
TOPICAL | Status: DC | PRN
  Administered 2022-02-04: 1 via TOPICAL

## 2022-02-04 MED ORDER — METFORMIN HCL 500 MG PO TABS
500.0000 mg | ORAL_TABLET | Freq: Three times a day (TID) | ORAL | Status: DC
  Administered 2022-02-05 – 2022-02-09 (×13): 500 mg via ORAL
  Filled 2022-02-04 (×13): qty 1

## 2022-02-04 MED ORDER — BISACODYL 10 MG RE SUPP: 10.0000 mg | Freq: Every day | RECTAL | Status: DC | PRN

## 2022-02-04 MED ORDER — METHOCARBAMOL 500 MG PO TABS
500.0000 mg | ORAL_TABLET | Freq: Two times a day (BID) | ORAL | Status: DC
  Administered 2022-02-04 – 2022-02-09 (×11): 500 mg via ORAL
  Filled 2022-02-04 (×11): qty 1

## 2022-02-04 MED ORDER — ONDANSETRON HCL 4 MG/2ML IJ SOLN
INTRAMUSCULAR | Status: DC | PRN
  Administered 2022-02-04: 4 mg via INTRAVENOUS

## 2022-02-04 MED ORDER — DOCUSATE SODIUM 100 MG PO CAPS
100.0000 mg | ORAL_CAPSULE | Freq: Every day | ORAL | Status: DC
  Administered 2022-02-05 – 2022-02-08 (×4): 100 mg via ORAL
  Filled 2022-02-04 (×4): qty 1

## 2022-02-04 MED ORDER — FERROUS GLUCONATE 324 (38 FE) MG PO TABS
324.0000 mg | ORAL_TABLET | Freq: Every day | ORAL | Status: DC
  Administered 2022-02-05 – 2022-02-09 (×5): 324 mg via ORAL
  Filled 2022-02-04 (×5): qty 1

## 2022-02-04 MED ORDER — HEPARIN 6000 UNIT IRRIGATION SOLUTION
Status: DC | PRN
  Administered 2022-02-04: 1

## 2022-02-04 MED ORDER — ROCURONIUM BROMIDE 10 MG/ML (PF) SYRINGE
PREFILLED_SYRINGE | INTRAVENOUS | Status: AC
  Filled 2022-02-04: qty 10

## 2022-02-04 MED ORDER — HYDROMORPHONE HCL 1 MG/ML IJ SOLN
0.2500 mg | INTRAMUSCULAR | Status: DC | PRN
  Administered 2022-02-04: 0.5 mg via INTRAVENOUS

## 2022-02-04 MED ORDER — GABAPENTIN 300 MG PO CAPS
300.0000 mg | ORAL_CAPSULE | Freq: Three times a day (TID) | ORAL | Status: DC
  Administered 2022-02-04 – 2022-02-09 (×15): 300 mg via ORAL
  Filled 2022-02-04 (×15): qty 1

## 2022-02-04 MED ORDER — SODIUM CHLORIDE 0.9 % IV SOLN
INTRAVENOUS | Status: DC
  Administered 2022-02-04: 500 mL via INTRAVENOUS

## 2022-02-04 MED ORDER — ALUM & MAG HYDROXIDE-SIMETH 200-200-20 MG/5ML PO SUSP: 15.0000 mL | ORAL | Status: DC | PRN

## 2022-02-04 MED ORDER — ONDANSETRON HCL 4 MG/2ML IJ SOLN
INTRAMUSCULAR | Status: AC
  Filled 2022-02-04: qty 2

## 2022-02-04 MED ORDER — PANTOPRAZOLE SODIUM 40 MG PO TBEC
40.0000 mg | DELAYED_RELEASE_TABLET | Freq: Every day | ORAL | Status: DC
  Administered 2022-02-04 – 2022-02-09 (×6): 40 mg via ORAL
  Filled 2022-02-04 (×6): qty 1

## 2022-02-04 MED ORDER — ACETAMINOPHEN 10 MG/ML IV SOLN
INTRAVENOUS | Status: DC | PRN
  Administered 2022-02-04: 1000 mg via INTRAVENOUS

## 2022-02-04 MED ORDER — HYDROCODONE-ACETAMINOPHEN 7.5-325 MG PO TABS: 1.0000 | ORAL_TABLET | Freq: Once | ORAL | Status: DC | PRN

## 2022-02-04 MED ORDER — PROTAMINE SULFATE 10 MG/ML IV SOLN
INTRAVENOUS | Status: DC | PRN
  Administered 2022-02-04: 50 mg via INTRAVENOUS

## 2022-02-04 MED ORDER — CEFAZOLIN SODIUM-DEXTROSE 2-4 GM/100ML-% IV SOLN
2.0000 g | INTRAVENOUS | Status: AC
  Administered 2022-02-04 (×2): 2 g via INTRAVENOUS
  Filled 2022-02-04: qty 100

## 2022-02-04 MED ORDER — TRAZODONE HCL 100 MG PO TABS
200.0000 mg | ORAL_TABLET | Freq: Every day | ORAL | Status: DC
  Administered 2022-02-04 – 2022-02-07 (×4): 200 mg via ORAL
  Filled 2022-02-04 (×4): qty 2

## 2022-02-04 MED ORDER — HEPARIN SODIUM (PORCINE) 1000 UNIT/ML IJ SOLN
INTRAMUSCULAR | Status: DC | PRN
  Administered 2022-02-04: 9000 [IU] via INTRAVENOUS

## 2022-02-04 MED ORDER — INSULIN ASPART 100 UNIT/ML IJ SOLN
0.0000 [IU] | Freq: Three times a day (TID) | INTRAMUSCULAR | Status: DC
  Administered 2022-02-04: 8 [IU] via SUBCUTANEOUS
  Administered 2022-02-05: 5 [IU] via SUBCUTANEOUS
  Administered 2022-02-05 – 2022-02-06 (×3): 3 [IU] via SUBCUTANEOUS
  Administered 2022-02-06 (×2): 2 [IU] via SUBCUTANEOUS
  Administered 2022-02-07 – 2022-02-08 (×4): 3 [IU] via SUBCUTANEOUS
  Administered 2022-02-09: 2 [IU] via SUBCUTANEOUS

## 2022-02-04 MED ORDER — GUAIFENESIN-DM 100-10 MG/5ML PO SYRP: 15.0000 mL | ORAL_SOLUTION | ORAL | Status: DC | PRN

## 2022-02-04 MED ORDER — LIDOCAINE 2% (20 MG/ML) 5 ML SYRINGE
INTRAMUSCULAR | Status: AC
  Filled 2022-02-04: qty 5

## 2022-02-04 MED ORDER — PHENOL 1.4 % MT LIQD: 1.0000 | OROMUCOSAL | Status: DC | PRN

## 2022-02-04 MED ORDER — CHLORHEXIDINE GLUCONATE 0.12 % MT SOLN
15.0000 mL | Freq: Once | OROMUCOSAL | Status: AC
  Administered 2022-02-04: 15 mL via OROMUCOSAL
  Filled 2022-02-04: qty 15

## 2022-02-04 MED ORDER — LISINOPRIL 20 MG PO TABS
40.0000 mg | ORAL_TABLET | Freq: Every day | ORAL | Status: DC
  Administered 2022-02-04 – 2022-02-09 (×3): 40 mg via ORAL
  Filled 2022-02-04 (×6): qty 2

## 2022-02-04 MED ORDER — SODIUM CHLORIDE 0.9 % IV SOLN: INTRAVENOUS | Status: DC

## 2022-02-04 MED ORDER — SUGAMMADEX SODIUM 200 MG/2ML IV SOLN
INTRAVENOUS | Status: DC | PRN
  Administered 2022-02-04 (×2): 100 mg via INTRAVENOUS

## 2022-02-04 MED ORDER — HYDROMORPHONE HCL 1 MG/ML IJ SOLN
0.5000 mg | INTRAMUSCULAR | Status: DC | PRN
  Administered 2022-02-04 (×3): 0.5 mg via INTRAVENOUS
  Administered 2022-02-04 – 2022-02-06 (×8): 1 mg via INTRAVENOUS
  Administered 2022-02-07 (×2): 0.5 mg via INTRAVENOUS
  Administered 2022-02-07: 1 mg via INTRAVENOUS
  Administered 2022-02-07: 0.5 mg via INTRAVENOUS
  Filled 2022-02-04: qty 1
  Filled 2022-02-04: qty 0.5
  Filled 2022-02-04 (×5): qty 1
  Filled 2022-02-04 (×3): qty 0.5
  Filled 2022-02-04 (×2): qty 1
  Filled 2022-02-04 (×2): qty 0.5
  Filled 2022-02-04: qty 1

## 2022-02-04 MED ORDER — DEXAMETHASONE SODIUM PHOSPHATE 10 MG/ML IJ SOLN
INTRAMUSCULAR | Status: DC | PRN
  Administered 2022-02-04: 5 mg via INTRAVENOUS

## 2022-02-04 MED ORDER — SODIUM CHLORIDE 0.9 % IV SOLN
500.0000 mL | Freq: Once | INTRAVENOUS | Status: AC | PRN
Start: 2022-02-04 — End: 2022-02-05
  Administered 2022-02-05: 500 mL via INTRAVENOUS

## 2022-02-04 MED ORDER — ACETAMINOPHEN 650 MG RE SUPP: 325.0000 mg | RECTAL | Status: DC | PRN

## 2022-02-04 MED ORDER — CEFAZOLIN SODIUM-DEXTROSE 2-4 GM/100ML-% IV SOLN
2.0000 g | Freq: Three times a day (TID) | INTRAVENOUS | Status: AC
  Administered 2022-02-04 (×2): 2 g via INTRAVENOUS
  Filled 2022-02-04 (×2): qty 100

## 2022-02-04 MED ORDER — METOPROLOL TARTRATE 5 MG/5ML IV SOLN: 2.0000 mg | INTRAVENOUS | Status: DC | PRN

## 2022-02-04 MED ORDER — LACTATED RINGERS IV SOLN: INTRAVENOUS | Status: DC

## 2022-02-04 MED ORDER — FENTANYL CITRATE (PF) 250 MCG/5ML IJ SOLN
INTRAMUSCULAR | Status: AC
  Filled 2022-02-04: qty 5

## 2022-02-04 MED ORDER — POTASSIUM CHLORIDE CRYS ER 20 MEQ PO TBCR: 20.0000 meq | EXTENDED_RELEASE_TABLET | Freq: Every day | ORAL | Status: DC | PRN

## 2022-02-04 MED ORDER — POLYETHYLENE GLYCOL 3350 17 G PO PACK: 17.0000 g | PACK | Freq: Every day | ORAL | Status: DC | PRN

## 2022-02-04 MED ORDER — DIAZEPAM 5 MG PO TABS
5.0000 mg | ORAL_TABLET | Freq: Two times a day (BID) | ORAL | Status: DC
  Administered 2022-02-04 – 2022-02-09 (×11): 5 mg via ORAL
  Filled 2022-02-04 (×11): qty 1

## 2022-02-04 MED ORDER — ONDANSETRON HCL 4 MG/2ML IJ SOLN
4.0000 mg | Freq: Four times a day (QID) | INTRAMUSCULAR | Status: DC | PRN
  Administered 2022-02-06: 4 mg via INTRAVENOUS
  Filled 2022-02-04: qty 2

## 2022-02-04 MED ORDER — ACETAMINOPHEN 10 MG/ML IV SOLN
INTRAVENOUS | Status: AC
Start: 2022-02-04 — End: ?
  Filled 2022-02-04: qty 100

## 2022-02-04 MED ORDER — PHENYLEPHRINE 80 MCG/ML (10ML) SYRINGE FOR IV PUSH (FOR BLOOD PRESSURE SUPPORT)
PREFILLED_SYRINGE | INTRAVENOUS | Status: DC | PRN
  Administered 2022-02-04 (×6): 80 ug via INTRAVENOUS

## 2022-02-04 MED ORDER — TRAMADOL HCL 50 MG PO TABS
50.0000 mg | ORAL_TABLET | Freq: Four times a day (QID) | ORAL | Status: DC
  Administered 2022-02-04 – 2022-02-08 (×14): 50 mg via ORAL
  Filled 2022-02-04 (×14): qty 1

## 2022-02-04 MED ORDER — MIDAZOLAM HCL 2 MG/2ML IJ SOLN
INTRAMUSCULAR | Status: AC
  Filled 2022-02-04: qty 2

## 2022-02-04 MED ORDER — INSULIN ASPART 100 UNIT/ML IJ SOLN
0.0000 [IU] | INTRAMUSCULAR | Status: DC | PRN
  Administered 2022-02-04: 2 [IU] via SUBCUTANEOUS
  Filled 2022-02-04: qty 1

## 2022-02-04 MED ORDER — PROPOFOL 10 MG/ML IV BOLUS
INTRAVENOUS | Status: AC
  Filled 2022-02-04: qty 20

## 2022-02-04 MED ORDER — 0.9 % SODIUM CHLORIDE (POUR BTL) OPTIME
TOPICAL | Status: DC | PRN
  Administered 2022-02-04: 1000 mL

## 2022-02-04 MED ORDER — LABETALOL HCL 200 MG PO TABS
300.0000 mg | ORAL_TABLET | Freq: Two times a day (BID) | ORAL | Status: DC
  Administered 2022-02-04 – 2022-02-09 (×6): 300 mg via ORAL
  Filled 2022-02-04 (×11): qty 2

## 2022-02-04 MED ORDER — DAPAGLIFLOZIN PROPANEDIOL 10 MG PO TABS
10.0000 mg | ORAL_TABLET | Freq: Every day | ORAL | Status: DC
  Administered 2022-02-04 – 2022-02-09 (×6): 10 mg via ORAL
  Filled 2022-02-04 (×6): qty 1

## 2022-02-04 MED ORDER — MAGNESIUM SULFATE 2 GM/50ML IV SOLN: 2.0000 g | Freq: Every day | INTRAVENOUS | Status: DC | PRN

## 2022-02-04 MED ORDER — HEPARIN SODIUM (PORCINE) 5000 UNIT/ML IJ SOLN
5000.0000 [IU] | Freq: Three times a day (TID) | INTRAMUSCULAR | Status: DC
  Administered 2022-02-05 – 2022-02-09 (×13): 5000 [IU] via SUBCUTANEOUS
  Filled 2022-02-04 (×12): qty 1

## 2022-02-04 MED ORDER — FLUOXETINE HCL 10 MG PO CAPS
10.0000 mg | ORAL_CAPSULE | Freq: Every day | ORAL | Status: DC
  Administered 2022-02-04 – 2022-02-09 (×6): 10 mg via ORAL
  Filled 2022-02-04 (×6): qty 1

## 2022-02-04 MED ORDER — MIDAZOLAM HCL 2 MG/2ML IJ SOLN
INTRAMUSCULAR | Status: DC | PRN
  Administered 2022-02-04: 2 mg via INTRAVENOUS

## 2022-02-04 MED ORDER — ACETAMINOPHEN 325 MG PO TABS
325.0000 mg | ORAL_TABLET | ORAL | Status: DC | PRN
  Administered 2022-02-04 – 2022-02-05 (×3): 650 mg via ORAL
  Filled 2022-02-04 (×3): qty 2

## 2022-02-04 MED ORDER — ASPIRIN 81 MG PO TBEC
81.0000 mg | DELAYED_RELEASE_TABLET | Freq: Every day | ORAL | Status: DC
  Administered 2022-02-04 – 2022-02-09 (×6): 81 mg via ORAL
  Filled 2022-02-04 (×6): qty 1

## 2022-02-04 MED ORDER — KETOROLAC TROMETHAMINE 30 MG/ML IJ SOLN: 30.0000 mg | Freq: Once | INTRAMUSCULAR | Status: DC | PRN

## 2022-02-04 MED ORDER — LIDOCAINE 2% (20 MG/ML) 5 ML SYRINGE
INTRAMUSCULAR | Status: DC | PRN
  Administered 2022-02-04: 60 mg via INTRAVENOUS

## 2022-02-04 MED ORDER — ATORVASTATIN CALCIUM 40 MG PO TABS
40.0000 mg | ORAL_TABLET | Freq: Every day | ORAL | Status: DC
  Administered 2022-02-05: 40 mg via ORAL
  Filled 2022-02-04: qty 1

## 2022-02-04 MED ORDER — LABETALOL HCL 5 MG/ML IV SOLN: 10.0000 mg | INTRAVENOUS | Status: DC | PRN

## 2022-02-04 SURGICAL SUPPLY — 84 items
ADH SKN CLS APL DERMABOND .7 (GAUZE/BANDAGES/DRESSINGS) ×1
AGENT HMST KT MTR STRL THRMB (HEMOSTASIS) ×1
BAG COUNTER SPONGE SURGICOUNT (BAG) ×2 IMPLANT
BAG SPNG CNTER NS LX DISP (BAG) ×1
BANDAGE ESMARK 6X9 LF (GAUZE/BANDAGES/DRESSINGS) IMPLANT
BNDG CMPR 9X6 STRL LF SNTH (GAUZE/BANDAGES/DRESSINGS) ×1
BNDG ESMARK 6X9 LF (GAUZE/BANDAGES/DRESSINGS) ×1
CANISTER SUCT 3000ML PPV (MISCELLANEOUS) ×2 IMPLANT
CANISTER WOUND CARE 500ML ATS (WOUND CARE) IMPLANT
CANNULA VESSEL 3MM 2 BLNT TIP (CANNULA) ×2 IMPLANT
CLIP TI WIDE RED SMALL 6 (CLIP) IMPLANT
CLIP VESOCCLUDE MED 24/CT (CLIP) ×2 IMPLANT
CLIP VESOCCLUDE SM WIDE 24/CT (CLIP) ×2 IMPLANT
CUFF TOURN SGL QUICK 24 (TOURNIQUET CUFF) ×1
CUFF TOURN SGL QUICK 34 (TOURNIQUET CUFF)
CUFF TOURN SGL QUICK 42 (TOURNIQUET CUFF) IMPLANT
CUFF TRNQT CYL 24X4X16.5-23 (TOURNIQUET CUFF) IMPLANT
CUFF TRNQT CYL 24X4X40X1 (TOURNIQUET CUFF) IMPLANT
CUFF TRNQT CYL 34X4.125X (TOURNIQUET CUFF) IMPLANT
DERMABOND ADVANCED (GAUZE/BANDAGES/DRESSINGS) ×1
DERMABOND ADVANCED .7 DNX12 (GAUZE/BANDAGES/DRESSINGS) ×2 IMPLANT
DRAIN CHANNEL 15F RND FF W/TCR (WOUND CARE) IMPLANT
DRAPE CAMERA VIDEO/LASER (DRAPES) IMPLANT
DRAPE HALF SHEET 40X57 (DRAPES) IMPLANT
DRAPE X-RAY CASS 24X20 (DRAPES) IMPLANT
DRESSING PEEL AND PLC PRVNA 13 (GAUZE/BANDAGES/DRESSINGS) IMPLANT
DRSG PEEL AND PLACE PREVENA 13 (GAUZE/BANDAGES/DRESSINGS) ×1
ELECT REM PT RETURN 9FT ADLT (ELECTROSURGICAL) ×1
ELECTRODE REM PT RTRN 9FT ADLT (ELECTROSURGICAL) ×2 IMPLANT
EVACUATOR SILICONE 100CC (DRAIN) IMPLANT
GAUZE 4X4 16PLY ~~LOC~~+RFID DBL (SPONGE) IMPLANT
GLOVE BIO SURGEON STRL SZ7 (GLOVE) IMPLANT
GLOVE BIO SURGEON STRL SZ7.5 (GLOVE) IMPLANT
GLOVE BIOGEL PI IND STRL 7.5 (GLOVE) IMPLANT
GLOVE BIOGEL PI IND STRL 8 (GLOVE) IMPLANT
GLOVE BIOGEL PI INDICATOR 7.5 (GLOVE) ×1
GLOVE BIOGEL PI INDICATOR 8 (GLOVE) ×1
GLOVE ECLIPSE 6.5 STRL STRAW (GLOVE) IMPLANT
GLOVE ECLIPSE 7.0 STRL STRAW (GLOVE) IMPLANT
GLOVE ECLIPSE 8.0 STRL XLNG CF (GLOVE) IMPLANT
GLOVE SURG SS PI 7.5 STRL IVOR (GLOVE) ×6 IMPLANT
GOWN STRL REUS W/ TWL LRG LVL3 (GOWN DISPOSABLE) ×4 IMPLANT
GOWN STRL REUS W/ TWL XL LVL3 (GOWN DISPOSABLE) ×2 IMPLANT
GOWN STRL REUS W/TWL LRG LVL3 (GOWN DISPOSABLE) ×2
GOWN STRL REUS W/TWL XL LVL3 (GOWN DISPOSABLE) ×1
HEMOSTAT SNOW SURGICEL 2X4 (HEMOSTASIS) IMPLANT
INSERT FOGARTY SM (MISCELLANEOUS) IMPLANT
KIT BASIN OR (CUSTOM PROCEDURE TRAY) ×2 IMPLANT
KIT TURNOVER KIT B (KITS) ×2 IMPLANT
MARKER GRAFT CORONARY BYPASS (MISCELLANEOUS) IMPLANT
NS IRRIG 1000ML POUR BTL (IV SOLUTION) ×4 IMPLANT
PACK PERIPHERAL VASCULAR (CUSTOM PROCEDURE TRAY) ×2 IMPLANT
PAD ARMBOARD 7.5X6 YLW CONV (MISCELLANEOUS) ×4 IMPLANT
SET COLLECT BLD 21X3/4 12 (NEEDLE) IMPLANT
SET WALTER ACTIVATION W/DRAPE (SET/KITS/TRAYS/PACK) IMPLANT
SHEATH PROBE COVER 6X72 (BAG) IMPLANT
SPONGE T-LAP 18X18 ~~LOC~~+RFID (SPONGE) IMPLANT
STOPCOCK 4 WAY LG BORE MALE ST (IV SETS) IMPLANT
SURGIFLO W/THROMBIN 8M KIT (HEMOSTASIS) IMPLANT
SUT ETHILON 3 0 PS 1 (SUTURE) IMPLANT
SUT GORETEX 6.0 TT13 (SUTURE) IMPLANT
SUT GORETEX 6.0 TT9 (SUTURE) IMPLANT
SUT PROLENE 5 0 C 1 24 (SUTURE) ×2 IMPLANT
SUT PROLENE 6 0 BV (SUTURE) ×2 IMPLANT
SUT PROLENE 7 0 BV 1 (SUTURE) IMPLANT
SUT SILK 2 0 SH (SUTURE) ×2 IMPLANT
SUT SILK 2 0 SH CR/8 (SUTURE) IMPLANT
SUT SILK 3 0 (SUTURE) ×1
SUT SILK 3-0 18XBRD TIE 12 (SUTURE) IMPLANT
SUT SILK 4 0 (SUTURE) ×1
SUT SILK 4-0 18XBRD TIE 12 (SUTURE) IMPLANT
SUT VIC AB 2-0 CT1 27 (SUTURE) ×3
SUT VIC AB 2-0 CT1 TAPERPNT 27 (SUTURE) ×4 IMPLANT
SUT VIC AB 3-0 SH 27 (SUTURE) ×5
SUT VIC AB 3-0 SH 27X BRD (SUTURE) ×4 IMPLANT
SUT VIC AB 4-0 PS2 18 (SUTURE) IMPLANT
SUT VICRYL 4-0 PS2 18IN ABS (SUTURE) ×4 IMPLANT
TOWEL GREEN STERILE (TOWEL DISPOSABLE) ×2 IMPLANT
TRAY FOLEY MTR SLVR 16FR STAT (SET/KITS/TRAYS/PACK) ×2 IMPLANT
TUBE CONNECTING 12X1/4 (SUCTIONS) IMPLANT
TUBE CONNECTING 20X1/4 (TUBING) IMPLANT
TUBING EXTENTION W/L.L. (IV SETS) IMPLANT
UNDERPAD 30X36 HEAVY ABSORB (UNDERPADS AND DIAPERS) ×2 IMPLANT
WATER STERILE IRR 1000ML POUR (IV SOLUTION) ×2 IMPLANT

## 2022-02-04 NOTE — Op Note (Signed)
Patient name: Taylor [name] MRN: [id number] DOB: 10-23-1980 Sex: female  02/04/2022 Pre-operative Diagnosis: Left leg rest pain Post-operative diagnosis:  Same Surgeon:  [name] Assistants:  Rosser Bing Procedure:   #1: left femoral to above knee popliteal bypass with ipsilateral vein   #2: left groin Prevena wound vac Anesthesia:  General Blood Loss:  100 cc Specimens:  none  Findings:  proximal anastomosis is to the common femoral artery which was soft, at the level of the inguinal ligament. The distal anastomosis is to the above-knee popliteal artery, as distal as I could get.  The vein dilated to 3.5 mm.  Indications: This is a 41 year old female with left leg rest pain.  She has a history of fourth and fifth toe amputations as well as percutaneous interventions which have failed.  She is now here for surgical revascularization  Procedure:  The patient was identified in the holding area and taken to [place] [facility]  The patient was then placed supine on the table. general anesthesia was administered.  The patient was prepped and draped in the usual sterile fashion.  A time out was called and antibiotics were administered.  A PA was necessary to expedite the procedure and assist with technical details.  The saphenous vein was marked out with ultrasound.  It was approximately a 3 mm vein throughout its length.  I began by making an oblique incision in the left groin.  Cautery was used divide subcutaneous tissue.  The femoral artery was then identified and sharply dissected out.  The common femoral artery was somewhat calcified below the circumflex vessels however above this it was very soft.  The profunda and superficial femoral artery were individually isolated as well as the distal external iliac artery.  I then exposed the femoral vein in the saphenofemoral junction.  The saphenous vein was then harvested through skip incisions down the leg.  Side branches were ligated between silk ties.   The distal medial incision was also used to expose the above-knee popliteal artery.  This was done behind the patella.  At this level the artery was soft.  I then made a distal stab incision to get extra vein.  The vein was then ligated proximally and distally with silk ties.  It was prepared on the back table.  It distended nicely to about 3-1/2 to 4 mm.  Next, a subsartorial tunnel was created with a curved Gore tunneler.  The patient was then fully heparinized.  Heparin levels were monitored throughout the case with ACT's.  I then placed a Walter retractor in the groin so that I could get good visualization of the proximal common femoral artery.  Because the artery was soft at this level and there was no stenosis on angiography in the common femoral artery, I elected to perform the anastomosis in the proximal common femoral artery.  The femoral vessels were then occluded with vascular clamps.  I used a #11 blade to make an arteriotomy which was extended longitudinally with Potts scissors.  The vein was placed in a nonreversed fashion and spatulated but the size the arteriotomy.  A running anastomosis was created with 5-0 Prolene.  Once this was completed the clamps were released.  I then used a valvulotome to lyse the valves.  2 passes were performed and there was excellent pulsatile bleeding through the graft.  The vein graft was then brought through the previously created tunnel.  I then placed a Webril in the mid thigh followed  by tourniquet.  The leg was exsanguinated with an Esmarch.  The tourniquet was taken to 250 mm of pressure.  A arteriotomy was then made in the above-knee popliteal artery as low as I can get, behind the patella.  The artery was very healthy at this level.  The vein was then cut the appropriate length and spatulated but the size the arteriotomy.  A running anastomosis was created with 6-0 Prolene.  Prior to completion, the appropriate flushing maneuvers were performed.  The  tourniquet was let down.  The anastomosis was then completed.  At this point there was an excellent pulse within the vein graft and a brisk posterior tibial Doppler signal.  I was very satisfied with these results.  The heparin was then reversed with 50 mg of protamine and hemostasis was achieved.  The wounds were then irrigated.  The vein harvest incisions were closed with 2 layers of Vicryl followed by Dermabond.  The below-knee incision was closed by reapproximating the fascia with 2-0 Vicryl.  The vein harvest tract was reapproximated with 3-0 Vicryl and the skin was closed with 4-0 Vicryl.  The groin incision was closed by reapproximating the femoral sheath with 2-0 Vicryl.  Additional subcutaneous layers were closed with 3-0 Vicryl followed by subcuticular closure.  A Prevena wound VAC was placed in the groin.  The PA was necessary for this procedure to help with exposure by providing suction and retraction as well as by following the sutures and helping with wound closure.  The patient was then successfully extubated and taken recovery in stable condition.   Disposition: To PACU stable.   Theotis Burrow, M.D., [facility] [place] Office: [phone] Pager:  [phone]

## 2022-02-04 NOTE — Progress Notes (Signed)
Patient did not take Labetalol 300 mg this morning. Dr. Doroteo Glassman made aware BP 103/60 and pulse 78. Hold Labetalol per Dr. Doroteo Glassman.

## 2022-02-04 NOTE — Progress Notes (Signed)
Pt arrived to 4E from PACU. A&Ox4. VSS. CHG bath given. Tele applied; CCMD notified. Incisions c/d/I. Pain 7/10. Pt oriented to room, call light in reach.  Raelyn Number, RN

## 2022-02-04 NOTE — Plan of Care (Signed)
  Problem: Education: Goal: Understanding of CV disease, CV risk reduction, and recovery process will improve Outcome: Progressing   Problem: Cardiovascular: Goal: Vascular access site(s) Level 0-1 will be maintained Outcome: Progressing   Problem: Metabolic: Goal: Ability to maintain appropriate glucose levels will improve Outcome: Progressing   Problem: Skin Integrity: Goal: Risk for impaired skin integrity will decrease Outcome: Progressing   Problem: Tissue Perfusion: Goal: Adequacy of tissue perfusion will improve Outcome: Progressing

## 2022-02-04 NOTE — Anesthesia Postprocedure Evaluation (Signed)
Anesthesia Post Note  Patient: Taylor [name]  Procedure(s) Performed: LEFT FEMORAL-POPLITEAL ARTERY BYPASS (Left: Leg Upper)     Patient location during evaluation: PACU Anesthesia Type: General Level of consciousness: awake and alert, oriented and patient cooperative Pain management: pain level controlled Vital Signs Assessment: post-procedure vital signs reviewed and stable Respiratory status: spontaneous breathing, nonlabored ventilation and respiratory function stable Cardiovascular status: blood pressure returned to baseline and stable Postop Assessment: no apparent nausea or vomiting Anesthetic complications: no   No notable events documented.  Last Vitals:  Vitals:   02/04/22 1335 02/04/22 1350  BP: 101/63 94/63  Pulse: 80 74  Resp: 14 14  Temp:  36.8 C  SpO2: 95% 92%    Last Pain:  Vitals:   02/04/22 1350  TempSrc:   PainSc: 0-No pain                 Pervis Hocking

## 2022-02-04 NOTE — Anesthesia Procedure Notes (Signed)
Procedure Name: Intubation Date/Time: 02/04/2022 7:46 AM  Performed by: Trinna Post., CRNAPre-anesthesia Checklist: Patient identified, Emergency Drugs available, Suction available, Patient being monitored and Timeout performed Patient Re-evaluated:Patient Re-evaluated prior to induction Oxygen Delivery Method: Circle system utilized Preoxygenation: Pre-oxygenation with 100% oxygen Induction Type: IV induction Ventilation: Mask ventilation without difficulty Laryngoscope Size: Mac and 3 Grade View: Grade I Tube type: Oral Tube size: 7.0 mm Number of attempts: 1 Airway Equipment and Method: Stylet Placement Confirmation: ETT inserted through vocal cords under direct vision, positive ETCO2 and breath sounds checked- equal and bilateral Secured at: 22 cm Tube secured with: Tape Dental Injury: Teeth and Oropharynx as per pre-operative assessment

## 2022-02-04 NOTE — Anesthesia Procedure Notes (Signed)
Arterial Line Insertion Start/End8/25/2023 7:15 AM Performed by: Trinna Post., CRNA, CRNA  Patient location: Pre-op. Preanesthetic checklist: patient identified, IV checked, site marked, risks and benefits discussed, surgical consent, monitors and equipment checked, pre-op evaluation, timeout performed and anesthesia consent Lidocaine 1% used for infiltration Right, radial was placed Catheter size: 20 G Hand hygiene performed  and maximum sterile barriers used   Attempts: 1 Procedure performed without using ultrasound guided technique. Following insertion, dressing applied and Biopatch. Post procedure assessment: normal  Patient tolerated the procedure well with no immediate complications.

## 2022-02-04 NOTE — Interval H&P Note (Signed)
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  The various methods of treatment have been discussed with the patient and family. After consideration of risks, benefits and other options for treatment, the patient has consented to  Procedure(s): LEFT FEMORAL-POPLITEAL ARTERY BYPASS (Left) as a surgical intervention.  The patient's history has been reviewed, patient examined, no change in status, stable for surgery.  I have reviewed the patient's chart and labs.  Questions were answered to the patient's satisfaction.     [name]

## 2022-02-04 NOTE — Transfer of Care (Signed)
Immediate Anesthesia Transfer of Care Note  Patient: Taylor [name]  Procedure(s) Performed: LEFT FEMORAL-POPLITEAL ARTERY BYPASS (Left: Leg Upper)  Patient Location: PACU  Anesthesia Type:General  Level of Consciousness: drowsy  Airway & Oxygen Therapy: Patient Spontanous Breathing  Post-op Assessment: Report given to RN and Post -op Vital signs reviewed and stable  Post vital signs: Reviewed and stable  Last Vitals:  Vitals Value Taken Time  BP 101/59 02/04/22 1320  Temp 36.8 C 02/04/22 1320  Pulse 79 02/04/22 1321  Resp 14 02/04/22 1321  SpO2 93 % 02/04/22 1321  Vitals shown include unvalidated device data.  Last Pain:  Vitals:   02/04/22 0606  TempSrc:   PainSc: 5       Patients Stated Pain Goal: 2 (95/36/92 2300)  Complications: No notable events documented.

## 2022-02-05 ENCOUNTER — Encounter (HOSPITAL_COMMUNITY): Payer: Self-pay | Admitting: Surgery

## 2022-02-05 LAB — CBC
HCT: 30.5 % — ABNORMAL LOW (ref 36.0–46.0)
Hemoglobin: 9.3 g/dL — ABNORMAL LOW (ref 12.0–15.0)
MCH: 24 pg — ABNORMAL LOW (ref 26.0–34.0)
MCHC: 30.5 g/dL (ref 30.0–36.0)
MCV: 78.6 fL — ABNORMAL LOW (ref 80.0–100.0)
Platelets: 207 10*3/uL (ref 150–400)
RBC: 3.88 MIL/uL (ref 3.87–5.11)
RDW: 16 % — ABNORMAL HIGH (ref 11.5–15.5)
WBC: 15.6 10*3/uL — ABNORMAL HIGH (ref 4.0–10.5)
nRBC: 0 % (ref 0.0–0.2)

## 2022-02-05 LAB — BASIC METABOLIC PANEL
Anion gap: 9 (ref 5–15)
BUN: 12 mg/dL (ref 6–20)
CO2: 24 mmol/L (ref 22–32)
Calcium: 8.6 mg/dL — ABNORMAL LOW (ref 8.9–10.3)
Chloride: 102 mmol/L (ref 98–111)
Creatinine, Ser: 0.82 mg/dL (ref 0.44–1.00)
GFR, Estimated: >60 mL/min (ref >60)
Glucose, Bld: 237 mg/dL — ABNORMAL HIGH (ref 70–99)
Potassium: 4.5 mmol/L (ref 3.5–5.1)
Sodium: 135 mmol/L (ref 135–145)

## 2022-02-05 LAB — LIPID PANEL
Cholesterol: 119 mg/dL (ref 0–200)
HDL: 39 mg/dL — ABNORMAL LOW (ref >40)
LDL Cholesterol: 28 mg/dL (ref 0–99)
Total CHOL/HDL Ratio: 3.1 RATIO
Triglycerides: 261 mg/dL — ABNORMAL HIGH (ref <150)
VLDL: 52 mg/dL — ABNORMAL HIGH (ref 0–40)

## 2022-02-05 LAB — GLUCOSE, CAPILLARY
Glucose-Capillary: 213 mg/dL — ABNORMAL HIGH (ref 70–99)
Glucose-Capillary: 168 mg/dL — ABNORMAL HIGH (ref 70–99)
Glucose-Capillary: 166 mg/dL — ABNORMAL HIGH (ref 70–99)
Glucose-Capillary: 172 mg/dL — ABNORMAL HIGH (ref 70–99)

## 2022-02-05 MED ORDER — KETOROLAC TROMETHAMINE 30 MG/ML IJ SOLN
30.0000 mg | Freq: Four times a day (QID) | INTRAMUSCULAR | Status: AC
  Administered 2022-02-05 – 2022-02-08 (×10): 30 mg via INTRAVENOUS
  Filled 2022-02-05 (×10): qty 1

## 2022-02-05 MED ORDER — ATORVASTATIN CALCIUM 80 MG PO TABS
80.0000 mg | ORAL_TABLET | Freq: Every day | ORAL | Status: DC
  Administered 2022-02-06 – 2022-02-09 (×4): 80 mg via ORAL
  Filled 2022-02-05 (×4): qty 1

## 2022-02-05 NOTE — Progress Notes (Signed)
Patient calling out frequently. Patient stating pain is 11-12. Pain medicine given every 2 hours at best. Patient tearful. States that she didn't expect it to hurt this bad. Explained to the patient that it will get better.   Patient voiced understanding.   Will continue to monitor

## 2022-02-05 NOTE — Evaluation (Addendum)
Physical Therapy Evaluation Patient Details Name: Taylor [name] MRN: [id number] DOB: 10-09-1980 Today's Date: 02/05/2022  History of Present Illness  41 y.o. female presents to [facility] hospital on 02/04/2022 with rest pain and claudication in LLE. Pt underwent L femoral to above knee popliteal bypass on 8/25. PMH includes anxiety, ARF, bipolar disorder, cerebral infarction, depression, DM, HTN, seizures.  Clinical Impression  Pt presents to PT with deficits in gait, balance, endurance, power. Pt is able to ambulate for household distances with support of RW at this time, demonstrating instability when standing without UE support. Pt will benefit from aggressive mobilization in an effort to reduce falls risk and restore her prior level of function. PT anticipates no need for PT services at the time of discharge. Pt will benefit from receiving a BSC to reduce falls risk associated with toileting needs.     Recommendations for follow up therapy are one component of a multi-disciplinary discharge planning process, led by the attending physician.  Recommendations may be updated based on patient status, additional functional criteria and insurance authorization.  Follow Up Recommendations No PT follow up      Assistance Recommended at Discharge PRN  Patient can return home with the following  A little help with walking and/or transfers;A little help with bathing/dressing/bathroom;Assistance with cooking/housework;Direct supervision/assist for medications management;Direct supervision/assist for financial management;Assist for transportation;Help with stairs or ramp for entrance    Equipment Recommendations BSC/3in1  Recommendations for Other Services       Functional Status Assessment Patient has had a recent decline in their functional status and demonstrates the ability to make significant improvements in function in a reasonable and predictable amount of time.     Precautions / Restrictions  Precautions Precautions: Fall Precaution Comments: L groin wound vac Restrictions Weight Bearing Restrictions: No      Mobility  Bed Mobility Overal bed mobility: Modified Independent             General bed mobility comments: increased time, HOB elevated    Transfers Overall transfer level: Needs assistance Equipment used: None Transfers: Sit to/from Stand Sit to Stand: Supervision                Ambulation/Gait Ambulation/Gait assistance: Supervision, Min assist Gait Distance (Feet): 150 Feet (initial 5' without device requiring minA) Assistive device: None, Rolling walker (2 wheels) Gait Pattern/deviations: Step-through pattern Gait velocity: reduced Gait velocity interpretation: <1.31 ft/sec, indicative of household ambulator   General Gait Details: pt with slowed step-through gait, reduced stride length  Stairs            Wheelchair Mobility    Modified Rankin (Stroke Patients Only)       Balance Overall balance assessment: Needs assistance Sitting-balance support: No upper extremity supported, Feet supported Sitting balance-Leahy Scale: Fair     Standing balance support: Bilateral upper extremity supported, Reliant on assistive device for balance Standing balance-Leahy Scale: Poor                               Pertinent Vitals/Pain Pain Assessment Pain Assessment: 0-10 Pain Score: 7  Pain Location: LLE and groin Pain Descriptors / Indicators: Burning Pain Intervention(s): Limited activity within patient's tolerance    Home Living Family/patient expects to be discharged to:: Private residence Living Arrangements: Parent Available Help at Discharge: Family;Available 24 hours/day Type of Home: House Home Access: Stairs to enter Entrance Stairs-Rails: None Entrance Stairs-Number of Steps: 2   Home  Layout: One level Home Equipment: Conservation officer, nature (2 wheels)      Prior Function Prior Level of Function : Needs  assist             Mobility Comments: independent with all mobility ADLs Comments: pt requires assistance with IADLs     Hand Dominance        Extremity/Trunk Assessment   Upper Extremity Assessment Upper Extremity Assessment: LUE deficits/detail LUE Deficits / Details: grossly 4-/5, ROM WFL    Lower Extremity Assessment Lower Extremity Assessment: LLE deficits/detail LLE Deficits / Details: grossly 4-/5, ROM WFL       Communication   Communication: Other (comment) (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)  Cognition Arousal/Alertness: Awake/alert Behavior During Therapy: WFL for tasks assessed/performed Overall Cognitive Status: History of cognitive impairments - at baseline                                 General Comments: pt with memory deficits since CVA, follows commands well during session        General Comments General comments (skin integrity, edema, etc.): VSS on RA    Exercises     Assessment/Plan    PT Assessment Patient needs continued PT services  PT Problem List Decreased balance;Decreased mobility;Decreased activity tolerance;Decreased strength;Decreased knowledge of use of DME;Pain       PT Treatment Interventions DME instruction;Gait training;Stair training;Functional mobility training;Therapeutic activities;Therapeutic exercise;Balance training;Neuromuscular re-education;Patient/family education    PT Goals (Current goals can be found in the Care Plan section)  Acute Rehab PT Goals Patient Stated Goal: to return to prior level of function, reduce pain PT Goal Formulation: With patient Time For Goal Achievement: 02/19/22 Potential to Achieve Goals: Good    Frequency Min 3X/week     Co-evaluation               AM-PAC PT "6 Clicks" Mobility  Outcome Measure Help needed turning from your back to your side while in a flat bed without using bedrails?: None Help needed moving from lying on your back to sitting on the side of a  flat bed without using bedrails?: None Help needed moving to and from a bed to a chair (including a wheelchair)?: A Little Help needed standing up from a chair using your arms (e.g., wheelchair or bedside chair)?: A Little Help needed to walk in hospital room?: A Little Help needed climbing 3-5 steps with a railing? : Total 6 Click Score: 18    End of Session   Activity Tolerance: Patient tolerated treatment well Patient left: in bed;with call bell/phone within reach;with family/visitor present Nurse Communication: Mobility status PT Visit Diagnosis: Other abnormalities of gait and mobility (R26.89);Pain Pain - Right/Left: Left Pain - part of body: Leg    Time: 5631-4970 PT Time Calculation (min) (ACUTE ONLY): 23 min   Charges:   PT Evaluation $PT Eval Low Complexity: 1 Low          Zenaida Niece, PT, DPT Acute Rehabilitation Office [phone]   Zenaida Niece 02/05/2022, 9:47 AM

## 2022-02-05 NOTE — Evaluation (Signed)
Occupational Therapy Evaluation Patient Details Name: Taylor [name] MRN: [id number] DOB: May 29, 1981 Today's Date: 02/05/2022   History of Present Illness 41 y.o. female presents to [facility] hospital on 02/04/2022 with rest pain and claudication in LLE. Pt underwent L femoral to above knee popliteal bypass on 8/25. PMH includes anxiety, ARF, bipolar disorder, cerebral infarction, depression, DM, HTN, seizures.   Clinical Impression   Pt is at Sup - Mod I with ADLs/selfcare and Sup with mobility HHA and with RW. Pt is limited by pain in L LE. Overall pt doing well from OT standpoint. All education completed and no further acute OT services are indicated at this time. Pt lives with family that can assist her as needed. OT will sign off      Recommendations for follow up therapy are one component of a multi-disciplinary discharge planning process, led by the attending physician.  Recommendations may be updated based on patient status, additional functional criteria and insurance authorization.   Follow Up Recommendations  No OT follow up    Assistance Recommended at Discharge PRN  Patient can return home with the following Assistance with cooking/housework;Help with stairs or ramp for entrance;Assist for transportation    Functional Status Assessment  Patient has not had a recent decline in their functional status  Equipment Recommendations  BSC/3in1    Recommendations for Other Services       Precautions / Restrictions Precautions Precautions: Fall Precaution Comments: L groin wound vac Restrictions Weight Bearing Restrictions: No      Mobility Bed Mobility               General bed mobility comments: pt seated at sink applying makeup upon arrival    Transfers Overall transfer level: Needs assistance Equipment used: None   Sit to Stand: Supervision                  Balance Overall balance assessment: Needs assistance Sitting-balance support: No upper extremity  supported, Feet supported Sitting balance-Leahy Scale: Good     Standing balance support: Bilateral upper extremity supported, During functional activity, Reliant on assistive device for balance Standing balance-Leahy Scale: Poor                             ADL either performed or assessed with clinical judgement   ADL Overall ADL's : At baseline;Modified independent                                       General ADL Comments: pt seated at sink applying makeup upon arrival. ADLs Sup - Mod I     Vision Patient Visual Report: No change from baseline       Perception     Praxis      Pertinent Vitals/Pain Pain Assessment Pain Assessment: 0-10 Pain Score: 8  Pain Location: L LE and groin Pain Descriptors / Indicators: Burning Pain Intervention(s): Monitored during session, Limited activity within patient's tolerance, Repositioned, Patient requesting pain meds-RN notified     Hand Dominance Right   Extremity/Trunk Assessment Upper Extremity Assessment Upper Extremity Assessment: Overall WFL for tasks assessed LUE Deficits / Details: grossly 4-/5, ROM WFL   Lower Extremity Assessment Lower Extremity Assessment: Defer to PT evaluation LLE Deficits / Details: grossly 4-/5, ROM WFL   Cervical / Trunk Assessment Cervical / Trunk Assessment: Normal   Communication Communication Communication:  Other (comment) (speech impairments prior to CVA)   Cognition Arousal/Alertness: Awake/alert Behavior During Therapy: WFL for tasks assessed/performed Overall Cognitive Status: History of cognitive impairments - at baseline                                 General Comments: pt with memory deficits since CVA, follows commands well during session     General Comments  VSS on RA    Exercises     Shoulder Instructions      Home Living Family/patient expects to be discharged to:: Private residence Living Arrangements: Parent Available  Help at Discharge: Family;Available 24 hours/day Type of Home: House Home Access: Stairs to enter [employer] of Steps: 2 Entrance Stairs-Rails: None Home Layout: One level               Home Equipment: Conservation officer, nature (2 wheels)          Prior Functioning/Environment Prior Level of Function : Needs assist             Mobility Comments: independent with all mobility ADLs Comments: pt requires assistance with IADLs        OT Problem List: Pain;Decreased activity tolerance      OT Treatment/Interventions:      OT Goals(Current goals can be found in the care plan section) Acute Rehab OT Goals Patient Stated Goal: go home  OT Frequency:      Co-evaluation              AM-PAC OT "6 Clicks" Daily Activity     Outcome Measure Help from another person eating meals?: None Help from another person taking care of personal grooming?: None Help from another person toileting, which includes using toliet, bedpan, or urinal?: None Help from another person bathing (including washing, rinsing, drying)?: None Help from another person to put on and taking off regular upper body clothing?: None Help from another person to put on and taking off regular lower body clothing?: None 6 Click Score: 24   End of Session    Activity Tolerance: Patient tolerated treatment well Patient left: in chair;with call bell/phone within reach;with nursing/sitter in room  OT Visit Diagnosis: Pain;Other abnormalities of gait and mobility (R26.89) Pain - Right/Left: Left Pain - part of body: Leg                Time: 1034-1101 OT Time Calculation (min): 27 min Charges:  OT General Charges $OT Visit: 1 Visit OT Evaluation $OT Eval Low Complexity: 1 Low OT Treatments $Self Care/Home Management : 8-22 mins   Britt Bottom 02/05/2022, 1:17 PM

## 2022-02-05 NOTE — Progress Notes (Signed)
Patient c/o headache posteriorly. Informed patient that I was unable to give her IV pain med d/t low b/p. Gave tylenol and tramadol.   Will continue to monitor

## 2022-02-05 NOTE — Progress Notes (Signed)
   02/05/22 1254  Assess: MEWS Score  Temp 98.6 F (37 C)  BP (!) 91/51  MAP (mmHg) (!) 64  ECG Heart Rate 82  Resp 13  Level of Consciousness Alert  SpO2 98 %  O2 Device Room Air  Assess: MEWS Score  MEWS Temp 0  MEWS Systolic 1  MEWS Pulse 0  MEWS RR 1  MEWS LOC 0  MEWS Score 2  MEWS Score Color Yellow  Assess: if the MEWS score is Yellow or Red  Were vital signs taken at a resting state? Yes  Focused Assessment No change from prior assessment  MEWS guidelines implemented *See Row Information* No, vital signs rechecked  Treat  Pain Scale 0-10  Pain Score 7  Pain Intervention(s) Medication (See eMAR);Repositioned  Take Vital Signs  Increase Vital Sign Frequency  Yellow: Q 2hr X 2 then Q 4hr X 2, if remains yellow, continue Q 4hrs  Escalate  MEWS: Escalate Yellow: discuss with charge nurse/RN and consider discussing with provider and RRT  Notify: Charge Nurse/RN  Name of Charge Nurse/RN Notified Erasmo Downer  Date Charge Nurse/RN Notified 02/05/22  Time Charge Nurse/RN Notified 1300  Assess: SIRS CRITERIA  SIRS Temperature  0  SIRS Pulse 0  SIRS Respirations  0  SIRS WBC 1  SIRS Score Sum  1

## 2022-02-05 NOTE — Progress Notes (Signed)
    Subjective  - POD #1, s/p left fem pop with vein  C/o pain   Physical Exam:  Palpable left PT  Incisions c/d/i       Assessment/Plan:  POD #1  Pain:  will ad toradol OOB walking Continue ASA and plavix  Wells Armonte Tortorella 02/05/2022 8:37 AM --  Vitals:   02/05/22 0617 02/05/22 0800  BP: (!) 96/50 107/74  Pulse:  74  Resp:  12  Temp:    SpO2:  98%    Intake/Output Summary (Last 24 hours) at 02/05/2022 0837 Last data filed at 02/05/2022 0700 Gross per 24 hour  Intake 2483.61 ml  Output 4825 ml  Net -2341.39 ml     Laboratory CBC    Component Value Date/Time   WBC 15.6 (H) 02/05/2022 0445   HGB 9.3 (L) 02/05/2022 0445   HCT 30.5 (L) 02/05/2022 0445   PLT 207 02/05/2022 0445    BMET    Component Value Date/Time   NA 135 02/05/2022 0445   K 4.5 02/05/2022 0445   CL 102 02/05/2022 0445   CO2 24 02/05/2022 0445   GLUCOSE 237 (H) 02/05/2022 0445   BUN 12 02/05/2022 0445   CREATININE 0.82 02/05/2022 0445   CREATININE 0.85 07/31/2019 1607   CALCIUM 8.6 (L) 02/05/2022 0445   GFRNONAA >60 02/05/2022 0445   GFRAA >60 07/04/2019 0442    COAG Lab Results  Component Value Date   INR 1.0 02/04/2022   INR 1.0 06/24/2019   INR 1.1 03/25/2019   No results found for: "PTT"  Antibiotics Anti-infectives (From admission, onward)    Start     Dose/Rate Route Frequency Ordered Stop   02/04/22 1515  ceFAZolin (ANCEF) IVPB 2g/100 mL premix        2 g 200 mL/hr over 30 Minutes Intravenous Every 8 hours 02/04/22 1429 02/05/22 0014   02/04/22 0547  ceFAZolin (ANCEF) IVPB 2g/100 mL premix        2 g 200 mL/hr over 30 Minutes Intravenous 30 min pre-op 02/04/22 0547 02/04/22 1158        V. Leia Alf, M.D., [facility] [place] Office: [phone] Pager:  [phone]

## 2022-02-05 NOTE — Progress Notes (Signed)
PHARMACIST LIPID MONITORING   QUANTA ROBERTSHAW is a 41 y.o. female admitted on 02/04/2022 with rest pain and short distance claudication. Pharmacy has been consulted to optimize lipid-lowering therapy with the indication of secondary prevention for clinical ASCVD.  Recent Labs:  Lipid Panel (last 6 months):   Lab Results  Component Value Date   CHOL 119 02/05/2022   TRIG 261 (H) 02/05/2022   HDL 39 (L) 02/05/2022   CHOLHDL 3.1 02/05/2022   VLDL 52 (H) 02/05/2022   LDLCALC 28 02/05/2022    Hepatic function panel (last 6 months):   Lab Results  Component Value Date   AST 20 02/04/2022   ALT 19 02/04/2022   ALKPHOS 64 02/04/2022   BILITOT 0.4 02/04/2022    SCr (since admission):   Serum creatinine: 0.82 mg/dL 02/05/22 0445 Estimated creatinine clearance: 99 mL/min  Current therapy and lipid therapy tolerance Current lipid-lowering therapy: atorvastatin 40 mg  Previous lipid-lowering therapies (if applicable): n/a Documented or reported allergies or intolerances to lipid-lowering therapies (if applicable): n/a  Assessment:   Patient agrees with changes to lipid-lowering therapy  Plan:    1.Statin intensity (high intensity recommended for all patients regardless of the LDL):  Add or increase statin to high intensity. Atorvastatin 80 mg PO daily.   2.Add ezetimibe (if any one of the following):   Not indicated at this time.  3.Refer to lipid clinic:   No  4.Follow-up with:  Primary care provider - Monico Blitz, MD  5.Follow-up labs after discharge:  Changes in lipid therapy were made. Check a lipid panel in 8-12 weeks then annually.      Gena Fray, PharmD PGY1 Pharmacy Resident   02/05/2022 11:59 AM

## 2022-02-05 NOTE — Plan of Care (Signed)
  Problem: Activity: Goal: Ability to return to baseline activity level will improve Outcome: Progressing   Problem: Fluid Volume: Goal: Ability to maintain a balanced intake and output will improve Outcome: Progressing   Problem: Nutritional: Goal: Maintenance of adequate nutrition will improve Outcome: Progressing

## 2022-02-05 NOTE — Plan of Care (Signed)
  Problem: Nutritional: Goal: Maintenance of adequate nutrition will improve Outcome: Progressing   Problem: Tissue Perfusion: Goal: Adequacy of tissue perfusion will improve Outcome: Progressing   

## 2022-02-06 LAB — GLUCOSE, CAPILLARY
Glucose-Capillary: 149 mg/dL — ABNORMAL HIGH (ref 70–99)
Glucose-Capillary: 193 mg/dL — ABNORMAL HIGH (ref 70–99)
Glucose-Capillary: 123 mg/dL — ABNORMAL HIGH (ref 70–99)
Glucose-Capillary: 180 mg/dL — ABNORMAL HIGH (ref 70–99)

## 2022-02-06 NOTE — Progress Notes (Signed)
    Subjective  - POD #2 2, status post left femoral-popliteal bypass graft with vein  Complaining of pain   Physical Exam:  Palpable left DP pulse Wound VAC with good seal in the groin       Assessment/Plan:  POD #2  -We will continue to monitor pain control.  Toradol was started yesterday -Continue statin therapy -Needs to get out of bed and ambulate  Wells Marshall Roehrich 02/06/2022 8:52 AM --  Vitals:   02/06/22 0400 02/06/22 0811  BP: 108/66 115/72  Pulse: 92 98  Resp: 16   Temp: 97.9 F (36.6 C) 97.9 F (36.6 C)  SpO2: 98%     Intake/Output Summary (Last 24 hours) at 02/06/2022 0852 Last data filed at 02/05/2022 1700 Gross per 24 hour  Intake 1100 ml  Output 0 ml  Net 1100 ml     Laboratory CBC    Component Value Date/Time   WBC 15.6 (H) 02/05/2022 0445   HGB 9.3 (L) 02/05/2022 0445   HCT 30.5 (L) 02/05/2022 0445   PLT 207 02/05/2022 0445    BMET    Component Value Date/Time   NA 135 02/05/2022 0445   K 4.5 02/05/2022 0445   CL 102 02/05/2022 0445   CO2 24 02/05/2022 0445   GLUCOSE 237 (H) 02/05/2022 0445   BUN 12 02/05/2022 0445   CREATININE 0.82 02/05/2022 0445   CREATININE 0.85 07/31/2019 1607   CALCIUM 8.6 (L) 02/05/2022 0445   GFRNONAA >60 02/05/2022 0445   GFRAA >60 07/04/2019 0442    COAG Lab Results  Component Value Date   INR 1.0 02/04/2022   INR 1.0 06/24/2019   INR 1.1 03/25/2019   No results found for: "PTT"  Antibiotics Anti-infectives (From admission, onward)    Start     Dose/Rate Route Frequency Ordered Stop   02/04/22 1515  ceFAZolin (ANCEF) IVPB 2g/100 mL premix        2 g 200 mL/hr over 30 Minutes Intravenous Every 8 hours 02/04/22 1429 02/05/22 0014   02/04/22 0547  ceFAZolin (ANCEF) IVPB 2g/100 mL premix        2 g 200 mL/hr over 30 Minutes Intravenous 30 min pre-op 02/04/22 0547 02/04/22 1158        V. Leia Alf, M.D., [facility] [place] Office:  [phone] Pager:  [phone]

## 2022-02-07 LAB — GLUCOSE, CAPILLARY
Glucose-Capillary: 171 mg/dL — ABNORMAL HIGH (ref 70–99)
Glucose-Capillary: 151 mg/dL — ABNORMAL HIGH (ref 70–99)
Glucose-Capillary: 154 mg/dL — ABNORMAL HIGH (ref 70–99)
Glucose-Capillary: 157 mg/dL — ABNORMAL HIGH (ref 70–99)

## 2022-02-07 LAB — POCT ACTIVATED CLOTTING TIME: Activated Clotting Time: 317 seconds

## 2022-02-07 NOTE — Progress Notes (Signed)
Mobility Specialist: Progress Note   02/07/22 1142  Mobility  Activity Ambulated with assistance in room  Level of Assistance Contact guard assist, steadying assist  Assistive Device Front wheel walker  Distance Ambulated (ft) 64 ft  Activity Response Tolerated well  $Mobility charge 1 Mobility   Pre-Mobility: 96 HR Post-Mobility: 93 HR, 85/52 (62) BP, 100% SpO2  Pt received in the chair and agreeable to mobility. C/o 10/10 pain at wound VAC site, otherwise asymptomatic. Pt to bed after session per request, call bell and phone at her side. RN notified of low BP.   [facility] Satcha Storlie Mobility Specialist Mobility Specialist [address]: [phone]

## 2022-02-07 NOTE — Progress Notes (Signed)
Physical Therapy Treatment Patient Details Name: ERMAGENE SAIDI MRN: [id number] DOB: 1981-02-05 Today's Date: 02/07/2022   History of Present Illness 41 y.o. female presents to [facility] hospital on 02/04/2022 with rest pain and claudication in LLE. Pt underwent L femoral to above knee popliteal bypass on 8/25. PMH includes anxiety, ARF, bipolar disorder, cerebral infarction, depression, DM, HTN, seizures.    PT Comments    Pt received supine and willing to attempt OOB mobility despite increased c/o pain. Pt demonstrating ambulation in room with RW and up to min guard for safety, without LOB, however distance limited by pain and fatigue with pt tearful at end of session, pt requesting pain meds, RN notified. Further ambulation and therex deferred per pt request. Pt agreeable to time up in recliner at end of session. Pt educated re; benefits and importance of continued OOB mobility and activity recommendations with pt verbalizing understanding. Pt continues to benefit from skilled PT services to progress toward functional mobility goals.     Recommendations for follow up therapy are one component of a multi-disciplinary discharge planning process, led by the attending physician.  Recommendations may be updated based on patient status, additional functional criteria and insurance authorization.  Follow Up Recommendations  No PT follow up     Assistance Recommended at Discharge PRN  Patient can return home with the following A little help with walking and/or transfers;A little help with bathing/dressing/bathroom;Assistance with cooking/housework;Direct supervision/assist for medications management;Direct supervision/assist for financial management;Assist for transportation;Help with stairs or ramp for entrance   Equipment Recommendations  BSC/3in1    Recommendations for Other Services       Precautions / Restrictions Precautions Precautions: Fall Precaution Comments: L groin wound  vac Restrictions Weight Bearing Restrictions: No     Mobility  Bed Mobility Overal bed mobility: Modified Independent             General bed mobility comments: increased time and use of bedrail    Transfers Overall transfer level: Needs assistance Equipment used: Rolling walker (2 wheels) Transfers: Sit to/from Stand Sit to Stand: Supervision           General transfer comment: supervision for safety from EOB and low toilet    Ambulation/Gait Ambulation/Gait assistance: Supervision, Min guard Gait Distance (Feet): 45 Feet Assistive device: Rolling walker (2 wheels) Gait Pattern/deviations: Step-through pattern Gait velocity: reduced     General Gait Details: pt with slowed step-through gait, reduced stride length, distance limited by fatigue and pain, pt tearful   Stairs             Wheelchair Mobility    Modified Rankin (Stroke Patients Only)       Balance Overall balance assessment: Needs assistance Sitting-balance support: No upper extremity supported, Feet supported Sitting balance-Leahy Scale: Good     Standing balance support: Bilateral upper extremity supported, During functional activity, Reliant on assistive device for balance Standing balance-Leahy Scale: Poor                              Cognition Arousal/Alertness: Awake/alert Behavior During Therapy: WFL for tasks assessed/performed Overall Cognitive Status: History of cognitive impairments - at baseline                                 General Comments: pt with memory deficits since CVA, follows commands well during session  Exercises Other Exercises Other Exercises: further ambulation and exercise deferred secondary to pt increased pain    General Comments General comments (skin integrity, edema, etc.): VSS on RA      Pertinent Vitals/Pain Pain Assessment Pain Assessment: Faces Faces Pain Scale: Hurts even more Pain Location: L LE  and groin Pain Descriptors / Indicators: Grimacing, Guarding Pain Intervention(s): Monitored during session, Limited activity within patient's tolerance, Patient requesting pain meds-RN notified    Home Living                          Prior Function            PT Goals (current goals can now be found in the care plan section) Acute Rehab PT Goals Patient Stated Goal: reduce pain PT Goal Formulation: With patient Time For Goal Achievement: 02/19/22    Frequency    Min 3X/week      PT Plan      Co-evaluation              AM-PAC PT "6 Clicks" Mobility   Outcome Measure  Help needed turning from your back to your side while in a flat bed without using bedrails?: None Help needed moving from lying on your back to sitting on the side of a flat bed without using bedrails?: None Help needed moving to and from a bed to a chair (including a wheelchair)?: A Little Help needed standing up from a chair using your arms (e.g., wheelchair or bedside chair)?: A Little Help needed to walk in hospital room?: A Little Help needed climbing 3-5 steps with a railing? : Total 6 Click Score: 18    End of Session Equipment Utilized During Treatment: Gait belt Activity Tolerance: Patient tolerated treatment well Patient left: in bed;with call bell/phone within reach;with family/visitor present Nurse Communication: Mobility status PT Visit Diagnosis: Other abnormalities of gait and mobility (R26.89);Pain Pain - Right/Left: Left Pain - part of body: Leg     Time: 0071-2197 PT Time Calculation (min) (ACUTE ONLY): 21 min  Charges:  $Therapeutic Activity: 8-22 mins                     Irisha Grandmaison R. PTA Acute Rehabilitation Services Office: [place] 02/07/2022, 9:14 AM

## 2022-02-07 NOTE — Progress Notes (Signed)
VVS PA removed VAC and dry gauze placed to incision. Incisions oozing serous fluid. Pt rating pain 9/10. Dressing changes PRN.  Raelyn Number, RN

## 2022-02-07 NOTE — Progress Notes (Addendum)
  Progress Note    02/07/2022 7:57 AM 3 Days Post-Op  Subjective:  Patient is sleepy. States that her pain meds help a little and make her tired.    Vitals:   02/07/22 0600 02/07/22 0739  BP: (!) 90/46 (!) 91/43  Pulse:  88  Resp:  20  Temp: 98.3 F (36.8 C) 97.7 F (36.5 C)  SpO2:  99%    Physical Exam: Lungs:  nonlabored Incisions:  LLE incisions c/d/l. Groin incision with prevena vac, good seal.  Extremities:  Palpable left DP pulse   CBC    Component Value Date/Time   WBC 15.6 (H) 02/05/2022 0445   RBC 3.88 02/05/2022 0445   HGB 9.3 (L) 02/05/2022 0445   HCT 30.5 (L) 02/05/2022 0445   PLT 207 02/05/2022 0445   MCV 78.6 (L) 02/05/2022 0445   MCH 24.0 (L) 02/05/2022 0445   MCHC 30.5 02/05/2022 0445   RDW 16.0 (H) 02/05/2022 0445   LYMPHSABS 1.6 06/26/2019 0957   MONOABS 0.4 06/26/2019 0957   EOSABS 0.0 06/26/2019 0957   BASOSABS 0.0 06/26/2019 0957    BMET    Component Value Date/Time   NA 135 02/05/2022 0445   K 4.5 02/05/2022 0445   CL 102 02/05/2022 0445   CO2 24 02/05/2022 0445   GLUCOSE 237 (H) 02/05/2022 0445   BUN 12 02/05/2022 0445   CREATININE 0.82 02/05/2022 0445   CREATININE 0.85 07/31/2019 1607   CALCIUM 8.6 (L) 02/05/2022 0445   GFRNONAA >60 02/05/2022 0445   GFRAA >60 07/04/2019 0442    INR    Component Value Date/Time   INR 1.0 02/04/2022 0537     Intake/Output Summary (Last 24 hours) at 02/07/2022 0757 Last data filed at 02/06/2022 1800 Gross per 24 hour  Intake 800 ml  Output 0 ml  Net 800 ml     Assessment/Plan:  41 y.o. female is 3 days post op, s/p: left femoral-popliteal bypass graft with vein   -Palpable left DP pulse -Wound vac to left groin with good seal -Incisions are c/d/l with no hematoma or drainage -Ambulating more to the bathroom with some assist -Pain doing okay with toradol -Home today or tomorrow  Vicente Serene, PA-C [facility] [phone] 02/07/2022 7:57 AM

## 2022-02-07 NOTE — Progress Notes (Signed)
Repeatedly removing purewick--replaced x 3 throughout shift. Around 0330 patient called out stating that bed was saturated and she needed to have linen changed. Bed and floor beside bed wet. While providing care patient asked if she could have pain medication. This Probation officer informed her that once care was completed it would be administered. Patient became agitated stating no one has the right to be bossy and demanding to her and that I better wipe her ass and get her her "God d*mn" pain medication right now. Continued yelling at nursing assistant and nurse stating that we better get done and do what she says. This Probation officer informed patient that she did not have a right to speak with staff that way as we were attempting to assist her and that once care was completed, she could have her medication. Patient continued yelling and cursing at staff. At this time, staff exited room and reported incident to charge nurse. 2 separate nurses at bedside to assist patient in completion of care.

## 2022-02-07 NOTE — Progress Notes (Signed)
The nurse and I entered the room to clean the pt and change her bed linen as she had removed her purewick and soaked the bed with urine. As the nurse and I were assisting the pt, the pt asks for pain medicine. Her nurse explained to her that after we washed her up and got her cleaned she will, then, provide her with her medication. The pt then begins to get irate because the pt wanted the nurse and I to stop everything and give her the medicine. She then proceeded to say that she doesn't have to listen to the nurses damn mouth and also said "just wash my damn ass and we have to do what she says." The nurse and I were being verbally abused while trying to assist the pt. The nurse was not okay with the verbal abuse and I was not as well. The nurse exited the room as I stayed to make sure the pt was safe and would not hurt herself. The pt continued with the verbal abuse to me as I was reassuring her we were trying to help. Two other available nurses on the floor came in to finish cleaning the pt.

## 2022-02-07 NOTE — Progress Notes (Signed)
This nurse noted increase of serous drainage from LLE incisions and groin VAC dressing has come loose. Notified VVS PA who came to bedside to assess. Wound VAC dressing to be changed.  Raelyn Number, RN

## 2022-02-08 LAB — GLUCOSE, CAPILLARY
Glucose-Capillary: 107 mg/dL — ABNORMAL HIGH (ref 70–99)
Glucose-Capillary: 184 mg/dL — ABNORMAL HIGH (ref 70–99)
Glucose-Capillary: 116 mg/dL — ABNORMAL HIGH (ref 70–99)
Glucose-Capillary: 187 mg/dL — ABNORMAL HIGH (ref 70–99)

## 2022-02-08 MED ORDER — HYDROCODONE-ACETAMINOPHEN 5-325 MG PO TABS
2.0000 | ORAL_TABLET | Freq: Four times a day (QID) | ORAL | Status: DC | PRN
  Administered 2022-02-08 – 2022-02-09 (×4): 2 via ORAL
  Filled 2022-02-08 (×4): qty 2

## 2022-02-08 NOTE — Progress Notes (Signed)
   02/08/22 1641  Mobility  Activity Ambulated with assistance in hallway  Level of Assistance Contact guard assist, steadying assist  Assistive Device Front wheel walker  Distance Ambulated (ft) 256 ft  Activity Response Tolerated well  $Mobility charge 1 Mobility   Mobility Specialist Progress Note  Received pt in bed having no complaints and agreeable to mobility. Pt was asymptomatic throughout ambulation and returned to room w/o fault. Left in bed w/ call bell in reach and alarm back on.   Lucious Groves Mobility Specialist

## 2022-02-08 NOTE — Progress Notes (Addendum)
  Progress Note    02/08/2022 7:40 AM 4 Days Post-Op  Subjective:  c/o pain.  Says if she would've known it would hurt like this, she would not have had surgery.  Afebrile HR 90's-110's ST 61'W-431'V systolic 40% RA  Vitals:   02/07/22 2337 02/08/22 0421  BP: (!) 83/56 (!) 85/51  Pulse: 92 91  Resp: 17 20  Temp: 98.1 F (36.7 C) 98.2 F (36.8 C)  SpO2: 96% 94%    Physical Exam: Cardiac:  regular Lungs:  non labored Incisions:  serous drainage on thigh bandages; left groin looks ok-there is dry gauze in groin crease Extremities:  palpable left DP pulse; left calf is soft Abdomen:  soft  CBC    Component Value Date/Time   WBC 15.6 (H) 02/05/2022 0445   RBC 3.88 02/05/2022 0445   HGB 9.3 (L) 02/05/2022 0445   HCT 30.5 (L) 02/05/2022 0445   PLT 207 02/05/2022 0445   MCV 78.6 (L) 02/05/2022 0445   MCH 24.0 (L) 02/05/2022 0445   MCHC 30.5 02/05/2022 0445   RDW 16.0 (H) 02/05/2022 0445   LYMPHSABS 1.6 06/26/2019 0957   MONOABS 0.4 06/26/2019 0957   EOSABS 0.0 06/26/2019 0957   BASOSABS 0.0 06/26/2019 0957    BMET    Component Value Date/Time   NA 135 02/05/2022 0445   K 4.5 02/05/2022 0445   CL 102 02/05/2022 0445   CO2 24 02/05/2022 0445   GLUCOSE 237 (H) 02/05/2022 0445   BUN 12 02/05/2022 0445   CREATININE 0.82 02/05/2022 0445   CREATININE 0.85 07/31/2019 1607   CALCIUM 8.6 (L) 02/05/2022 0445   GFRNONAA >60 02/05/2022 0445   GFRAA >60 07/04/2019 0442    INR    Component Value Date/Time   INR 1.0 02/04/2022 0537     Intake/Output Summary (Last 24 hours) at 02/08/2022 0740 Last data filed at 02/07/2022 2000 Gross per 24 hour  Intake 240 ml  Output --  Net 240 ml     Assessment/Plan:  41 y.o. female is s/p:  Procedure:    #1: left femoral to above knee popliteal bypass with ipsilateral vein #2: left groin Prevena wound vac  4 Days Post-Op   -pt with palpable left DP pulse -left groin with dry gauze-she is at high risk for groin  infection due to body habitus and therefore meticulous wound care is needed.   -serous drainage from thigh incisions.  Incisions look fine and no evidence of infection -mobilize -DVT prophylaxis:  sq heparin   Leontine Locket, PA-C [facility] [phone] 02/08/2022 7:40 AM  Stopped Ultram and changed to [place].  ABD to left groin to keep moisture out.  Anticipate d./c tomorrow  [name]

## 2022-02-08 NOTE — Plan of Care (Signed)
  Problem: Cardiovascular: Goal: Ability to achieve and maintain adequate cardiovascular perfusion will improve Outcome: Progressing Goal: Vascular access site(s) Level 0-1 will be maintained Outcome: Progressing   Problem: Health Behavior/Discharge Planning: Goal: Ability to safely manage health-related needs after discharge will improve Outcome: Progressing   Problem: Coping: Goal: Ability to adjust to condition or change in health will improve Outcome: Not Progressing

## 2022-02-09 LAB — GLUCOSE, CAPILLARY
Glucose-Capillary: 129 mg/dL — ABNORMAL HIGH (ref 70–99)
Glucose-Capillary: 140 mg/dL — ABNORMAL HIGH (ref 70–99)

## 2022-02-09 MED ORDER — HYDROCODONE-ACETAMINOPHEN 5-325 MG PO TABS: 1.0000 | ORAL_TABLET | Freq: Four times a day (QID) | ORAL | 0 refills | Status: DC | PRN

## 2022-02-09 MED ORDER — ATORVASTATIN CALCIUM 80 MG PO TABS: 80.0000 mg | ORAL_TABLET | Freq: Every day | ORAL | 2 refills | Status: DC

## 2022-02-09 NOTE — Progress Notes (Signed)
  Progress Note    02/09/2022 8:04 AM 5 Days Post-Op  Subjective:  Patient says she feels much better and pain is going down. Ready to go home   Vitals:   02/09/22 0414 02/09/22 0755  BP: 104/72 123/79  Pulse: 90 88  Resp: 20 (!) 21  Temp: 97.7 F (36.5 C) 97.8 F (36.6 C)  SpO2: 97% 99%    Physical Exam: Lungs: nonlabored Incisions:  L groin incision c/d/l with dry ABD overtop. LLE incisions c/d/l Extremities:  Palpable L DP pulse   CBC    Component Value Date/Time   WBC 15.6 (H) 02/05/2022 0445   RBC 3.88 02/05/2022 0445   HGB 9.3 (L) 02/05/2022 0445   HCT 30.5 (L) 02/05/2022 0445   PLT 207 02/05/2022 0445   MCV 78.6 (L) 02/05/2022 0445   MCH 24.0 (L) 02/05/2022 0445   MCHC 30.5 02/05/2022 0445   RDW 16.0 (H) 02/05/2022 0445   LYMPHSABS 1.6 06/26/2019 0957   MONOABS 0.4 06/26/2019 0957   EOSABS 0.0 06/26/2019 0957   BASOSABS 0.0 06/26/2019 0957    BMET    Component Value Date/Time   NA 135 02/05/2022 0445   K 4.5 02/05/2022 0445   CL 102 02/05/2022 0445   CO2 24 02/05/2022 0445   GLUCOSE 237 (H) 02/05/2022 0445   BUN 12 02/05/2022 0445   CREATININE 0.82 02/05/2022 0445   CREATININE 0.85 07/31/2019 1607   CALCIUM 8.6 (L) 02/05/2022 0445   GFRNONAA >60 02/05/2022 0445   GFRAA >60 07/04/2019 0442    INR    Component Value Date/Time   INR 1.0 02/04/2022 0537    No intake or output data in the 24 hours ending 02/09/22 0804   Assessment/Plan:  41 y.o. female is 5 days post op, s/p: left femoral to above knee popliteal bypass with ipsilateral vein  -Palpable left DP pulse -L groin incision intact. Keep dry ABD pad in place to keep moisture out and reduce risk of infection -Some serous drainage from LLE incisions, no signs of infection. Incisions are intact. -Patient's pain is well controlled with oral pain meds -Will d/c home today   Vicente Serene, PA-C [facility] [phone] 02/09/2022 8:04 AM

## 2022-02-09 NOTE — Plan of Care (Signed)
  Problem: Education: Goal: Understanding of CV disease, CV risk reduction, and recovery process will improve Outcome: Adequate for Discharge Goal: Individualized Educational Video(s) Outcome: Adequate for Discharge   Problem: Activity: Goal: Ability to return to baseline activity level will improve Outcome: Adequate for Discharge   Problem: Cardiovascular: Goal: Ability to achieve and maintain adequate cardiovascular perfusion will improve Outcome: Adequate for Discharge Goal: Vascular access site(s) Level 0-1 will be maintained Outcome: Adequate for Discharge   Problem: Health Behavior/Discharge Planning: Goal: Ability to safely manage health-related needs after discharge will improve Outcome: Adequate for Discharge   Problem: Education: Goal: Ability to describe self-care measures that may prevent or decrease complications (Diabetes Survival Skills Education) will improve Outcome: Adequate for Discharge Goal: Individualized Educational Video(s) Outcome: Adequate for Discharge   Problem: Coping: Goal: Ability to adjust to condition or change in health will improve Outcome: Adequate for Discharge   Problem: Fluid Volume: Goal: Ability to maintain a balanced intake and output will improve Outcome: Adequate for Discharge   Problem: Health Behavior/Discharge Planning: Goal: Ability to identify and utilize available resources and services will improve Outcome: Adequate for Discharge Goal: Ability to manage health-related needs will improve Outcome: Adequate for Discharge   Problem: Metabolic: Goal: Ability to maintain appropriate glucose levels will improve Outcome: Adequate for Discharge   Problem: Nutritional: Goal: Maintenance of adequate nutrition will improve Outcome: Adequate for Discharge Goal: Progress toward achieving an optimal weight will improve Outcome: Adequate for Discharge   Problem: Skin Integrity: Goal: Risk for impaired skin integrity will  decrease Outcome: Adequate for Discharge   Problem: Tissue Perfusion: Goal: Adequacy of tissue perfusion will improve Outcome: Adequate for Discharge   Problem: Education: Goal: Knowledge of General Education information will improve Description: Including pain rating scale, medication(s)/side effects and non-pharmacologic comfort measures Outcome: Adequate for Discharge   Problem: Health Behavior/Discharge Planning: Goal: Ability to manage health-related needs will improve Outcome: Adequate for Discharge   Problem: Clinical Measurements: Goal: Ability to maintain clinical measurements within normal limits will improve Outcome: Adequate for Discharge Goal: Will remain free from infection Outcome: Adequate for Discharge Goal: Diagnostic test results will improve Outcome: Adequate for Discharge Goal: Respiratory complications will improve Outcome: Adequate for Discharge Goal: Cardiovascular complication will be avoided Outcome: Adequate for Discharge   Problem: Activity: Goal: Risk for activity intolerance will decrease Outcome: Adequate for Discharge   Problem: Nutrition: Goal: Adequate nutrition will be maintained Outcome: Adequate for Discharge   Problem: Coping: Goal: Level of anxiety will decrease Outcome: Adequate for Discharge   Problem: Elimination: Goal: Will not experience complications related to bowel motility Outcome: Adequate for Discharge Goal: Will not experience complications related to urinary retention Outcome: Adequate for Discharge   Problem: Safety: Goal: Ability to remain free from injury will improve Outcome: Adequate for Discharge   Problem: Skin Integrity: Goal: Risk for impaired skin integrity will decrease Outcome: Adequate for Discharge

## 2022-02-09 NOTE — TOC Transition Note (Signed)
Transition of Care (TOC) - CM/SW Discharge Note [name] RN, BSN Transitions of Care Unit 4E- RN Case Manager See Treatment Team for direct phone #    Patient Details  Name: Taylor [name] MRN: [id number] Date of Birth: [birth year]  Transition of Care [facility]) CM/SW Contact:  [name], RN Phone Number: 02/09/2022, 11:35 AM   Clinical Narrative:    Pt stable for transition home today. Noted DME- 3n1 order, per bedside RN pt is requesting 3n1 for  home.  No f/u for [facility] needs noted.  Call made to in house provider - [facility]- for DME- 3n1- to be delivered to room prior to discharge.  Mom coming to provide transportation home.   1040- received msg from [employer]- pt received BSC in 2021- insurance will not cover 3n1 at this time. CM went to speak with pt at bedside- per pt she does not have BSC at home any longer, thinks her mom donated it. Pt states we will need to speak with her mom when she gets here. Explained to pt that insurance will not cover another one at this time- (cover once every 5 yrs). Explained out of pocket cost- pt again voiced she would like the matter discussed with her mom when she gets here. Have requested bedside RN alert CM when mom arrives.        Final next level of care: Home/Self Care Barriers to Discharge: No Barriers Identified   Patient Goals and CMS Choice Patient states their goals for this hospitalization and ongoing recovery are:: return home CMS Medicare.gov Compare Post Acute Care list provided to:: Patient Choice offered to / list presented to : Patient  Discharge Placement               Home        Discharge Plan and Services   Discharge Planning Services: CM Consult Post Acute Care Choice: Durable Medical Equipment          DME Arranged: 3-N-1 DME Agency: [facility] Date DME Agency Contacted: 02/09/22 Time DME Agency Contacted: 0930 Representative spoke with at DME Agency: Jodell Cipro HH Arranged: NA [place] Agency: NA         Social Determinants of Health ([place]) Interventions     Readmission Risk Interventions    02/09/2022   11:34 AM  Readmission Risk Prevention Plan  Post Dischage Appt Complete  Medication Screening Complete  Transportation Screening Complete

## 2022-02-09 NOTE — Discharge Instructions (Signed)
 [facility]  Discharge instructions  Lower Extremity Bypass Surgery  Please refer to the following instruction for your post-procedure care. Your surgeon or physician assistant will discuss any changes with you.  Activity  You are encouraged to walk as much as you can. You can slowly return to normal activities during the month after your surgery. Avoid strenuous activity and heavy lifting until your doctor tells you it's OK. Avoid activities such as vacuuming or swinging a golf club. Do not drive until your doctor give the OK and you are no longer taking prescription pain medications. It is also normal to have difficulty with sleep habits, eating and bowel movement after surgery. These will go away with time.  Bathing/Showering  Shower daily after you go home. Do not soak in a bathtub, hot tub, or swim until the incision heals completely.  Incision Care  Clean your incision with mild soap and water. Shower every day. Pat the area dry with a clean towel. You do not need a bandage unless otherwise instructed. Do not apply any ointments or creams to your incision. If you have open wounds you will be instructed how to care for them or a visiting nurse may be arranged for you. If you have staples or sutures along your incision they will be removed at your post-op appointment. You may have skin glue on your incision. Do not peel it off. It will come off on its own in about one week.  Wash the groin wound with soap and water daily and pat dry. (No tub bath-only shower)  Then put a dry gauze or washcloth in the groin to keep this area dry to help prevent wound infection.  Do this daily and as needed.  Do not use Vaseline or neosporin on your incisions.  Only use soap and water on your incisions and then protect and keep dry.  Diet  Resume your normal diet. There are no special food restrictions following this procedure. A low fat/ low cholesterol diet is  recommended for all patients with vascular disease. In order to heal from your surgery, it is CRITICAL to get adequate nutrition. Your body requires vitamins, minerals, and protein. Vegetables are the best source of vitamins and minerals. Vegetables also provide the perfect balance of protein. Processed food has little nutritional value, so try to avoid this.  Medications  Resume taking all your medications unless your doctor or physician assistant tells you not to. If your incision is causing pain, you may take over-the-counter pain relievers such as acetaminophen (Tylenol). If you were prescribed a stronger pain medication, please aware these medication can cause nausea and constipation. Prevent nausea by taking the medication with a snack or meal. Avoid constipation by drinking plenty of fluids and eating foods with high amount of fiber, such as fruits, vegetables, and grains. Take Colace 100 mg (an over-the-counter stool softener) twice a day as needed for constipation.  Do not take Tylenol if you are taking prescription pain medications.  Follow Up  Our office will schedule a follow up appointment 2-3 weeks following discharge.  Please call us immediately for any of the following conditions  Severe or worsening pain in your legs or feet while at rest or while walking Increase pain, redness, warmth, or drainage (pus) from your incision site(s) Fever of 101 degree or higher The swelling in your leg with the bypass suddenly worsens and becomes more painful than when you were in the hospital If you have   been instructed to feel your graft pulse then you should do so every day. If you can no longer feel this pulse, call the office immediately. Not all patients are given this instruction.  Leg swelling is common after leg bypass surgery.  The swelling should improve over a few months following surgery. To improve the swelling, you may elevate your legs above the level of your heart while you are  sitting or resting. Your surgeon or physician assistant may ask you to apply an ACE wrap or wear compression (TED) stockings to help to reduce swelling.  Reduce your risk of vascular disease  Stop smoking. If you would like help call [facility] at 1-800-QUIT-NOW (1-800-784-8669) or [place] at 336-586-4000.  Manage your cholesterol Maintain a desired weight Control your diabetes weight Control your diabetes Keep your blood pressure down  If you have any questions, please call the office at [phone]  

## 2022-02-09 NOTE — Progress Notes (Signed)
Pt to be d/c from 4E to home with Mother. D/c instructions and medication education provided. Tele and IV removed.  Raelyn Number, RN

## 2022-02-09 NOTE — Progress Notes (Signed)
Physical Therapy Treatment Patient Details Name: Taylor [name] MRN: [id number] DOB: 1981/03/18 Today's Date: 02/09/2022   History of Present Illness 41 y.o. female presents to [facility] hospital on 02/04/2022 with rest pain and claudication in LLE. Pt underwent L femoral to above knee popliteal bypass on 8/25. PMH includes anxiety, ARF, bipolar disorder, cerebral infarction, depression, DM, HTN, seizures.    PT Comments    Pt received supine and agreeable to session with continued progress towards acute goals. Session focused on gait and stair training for increased activity tolerance and safety with pt demonstrating increased tolerance for ambulation with RW and min guard for safety. Pt able to ascend/descend 2 steps with min guard with cues needed at start for sequencing with pt able to recall/teach back at end of session. Continued education re; importance of continued mobility and activity recommendations with pt verbalizing understanding. Anticipate safe discharge once medically cleared, will follow acutely. Pt continues to benefit from skilled PT services to progress toward functional mobility goals.     Recommendations for follow up therapy are one component of a multi-disciplinary discharge planning process, led by the attending physician.  Recommendations may be updated based on patient status, additional functional criteria and insurance authorization.  Follow Up Recommendations  No PT follow up     Assistance Recommended at Discharge PRN  Patient can return home with the following A little help with walking and/or transfers;A little help with bathing/dressing/bathroom;Assistance with cooking/housework;Direct supervision/assist for medications management;Direct supervision/assist for financial management;Assist for transportation;Help with stairs or ramp for entrance   Equipment Recommendations  BSC/3in1    Recommendations for Other Services       Precautions / Restrictions  Precautions Precautions: Fall Restrictions Weight Bearing Restrictions: No     Mobility  Bed Mobility Overal bed mobility: Modified Independent             General bed mobility comments: increased time and use of bedrail    Transfers Overall transfer level: Needs assistance Equipment used: Rolling walker (2 wheels) Transfers: Sit to/from Stand Sit to Stand: Supervision           General transfer comment: supervision for safety from EOB and low toilet    Ambulation/Gait Ambulation/Gait assistance: Supervision, Min guard Gait Distance (Feet): 240 Feet Assistive device: Rolling walker (2 wheels) Gait Pattern/deviations: Step-through pattern Gait velocity: reduced     General Gait Details: pt with slowed step-through gait, reduced stride length, distance limited by fatigue and pain, pt tearful   Stairs Stairs: Yes Stairs assistance: Min guard Stair Management: One rail Left, Step to pattern, Forwards Number of Stairs: 2 General stair comments: no LOB, cues needed at start for sequencing with good recall   Wheelchair Mobility    Modified Rankin (Stroke Patients Only)       Balance Overall balance assessment: Needs assistance Sitting-balance support: No upper extremity supported, Feet supported Sitting balance-Leahy Scale: Good     Standing balance support: Bilateral upper extremity supported, During functional activity, Reliant on assistive device for balance Standing balance-Leahy Scale: Poor                              Cognition Arousal/Alertness: Awake/alert Behavior During Therapy: WFL for tasks assessed/performed Overall Cognitive Status: History of cognitive impairments - at baseline  General Comments: pt with memory deficits since CVA, follows commands well during session        Exercises      General Comments General comments (skin integrity, edema, etc.): VSS on RA       Pertinent Vitals/Pain Pain Assessment Pain Assessment: Faces Faces Pain Scale: Hurts even more Pain Location: L LE and groin Pain Descriptors / Indicators: Grimacing, Guarding Pain Intervention(s): Monitored during session, Limited activity within patient's tolerance, Repositioned    Home Living                          Prior Function            PT Goals (current goals can now be found in the care plan section) Acute Rehab PT Goals Patient Stated Goal: reduce pain PT Goal Formulation: With patient Time For Goal Achievement: 02/19/22    Frequency    Min 3X/week      PT Plan      Co-evaluation              AM-PAC PT "6 Clicks" Mobility   Outcome Measure  Help needed turning from your back to your side while in a flat bed without using bedrails?: None Help needed moving from lying on your back to sitting on the side of a flat bed without using bedrails?: None Help needed moving to and from a bed to a chair (including a wheelchair)?: A Little Help needed standing up from a chair using your arms (e.g., wheelchair or bedside chair)?: A Little Help needed to walk in hospital room?: A Little Help needed climbing 3-5 steps with a railing? : A Little 6 Click Score: 20    End of Session   Activity Tolerance: Patient tolerated treatment well Patient left: in bed;with call bell/phone within reach Nurse Communication: Mobility status PT Visit Diagnosis: Other abnormalities of gait and mobility (R26.89);Pain Pain - Right/Left: Left Pain - part of body: Leg     Time: 7672-0947 PT Time Calculation (min) (ACUTE ONLY): 21 min  Charges:  $Gait Training: 8-22 mins                    Taylor [name] R. PTA Acute Rehabilitation Services Office: [place] 02/09/2022, 9:31 AM

## 2022-02-09 NOTE — Progress Notes (Signed)
Explained discharge summary to patient's mother Taylor [name] due to patient's memory impairment. Reviewed post op incisional care, follow up appointments, next medication administration times with mom. Patient's nurse also explained that the patient's insurance company will not cover her 3 n1 due to her having one in 2022. Taylor [name] verbalized having a full understanding of the instructions. All belongings were in the patient's care at the time of discharge. No other needs were noted.

## 2022-02-11 NOTE — Discharge Summary (Signed)
Discharge Summary     Taylor [name] [birth year] 41 y.o. female  [id number]  Admission Date: 02/04/2022  Discharge Date: 02/09/2022  Physician: No att. providers found  Admission Diagnosis: Claudication [employer]) [I73.9] PAD (peripheral artery disease) ([place]) [I73.9]  HPI:   This is a 41 y.o. female who was undergone the following procedures: Left superficial to the greater atherectomy with drug-coated balloon angioplasty 07/03/2019.  Left fourth and fifth ray amputation on 07/03/2019.  Left SFA drug-coated balloon on 10/22/2019.  Left SFA stent and drug-coated balloon on 04/01/2020.  The patient has reported several months of rest pain and short distance claudication in her left leg as of 02/04/2022.  Hospital Course:  The patient was admitted to the hospital and taken to the operating room on 02/04/2022 and underwent: Left femoral to above-knee popliteal bypass with ipsilateral vein.  A prevena wound VAC was placed to the left groin incision and and surgery.  The pt tolerated the procedure well and was transported to the PACU in good condition.   By POD 1, the patient was having issues with pain control.  She was requiring IV Dilaudid injections as needed for pain control.  Toradol was started on 02/05/2022.  She began to increase in ambulation by POD 2.  POD 3, the Prevena VAC no longer had a good seal in the left groin so it was removed.  She was initiated on ABD dressings to the left groin to keep moisture out and reduce the risk of infection.  She continued to increase in ambulation and tolerated a normal diet without difficulty throughout hospitalization.  She was discharged home on POD 5, 02/09/2022.   CBC    Component Value Date/Time   WBC 15.6 (H) 02/05/2022 0445   RBC 3.88 02/05/2022 0445   HGB 9.3 (L) 02/05/2022 0445   HCT 30.5 (L) 02/05/2022 0445   PLT 207 02/05/2022 0445   MCV 78.6 (L) 02/05/2022 0445   MCH 24.0 (L) 02/05/2022 0445   MCHC 30.5 02/05/2022 0445   RDW 16.0  (H) 02/05/2022 0445   LYMPHSABS 1.6 06/26/2019 0957   MONOABS 0.4 06/26/2019 0957   EOSABS 0.0 06/26/2019 0957   BASOSABS 0.0 06/26/2019 0957    BMET    Component Value Date/Time   NA 135 02/05/2022 0445   K 4.5 02/05/2022 0445   CL 102 02/05/2022 0445   CO2 24 02/05/2022 0445   GLUCOSE 237 (H) 02/05/2022 0445   BUN 12 02/05/2022 0445   CREATININE 0.82 02/05/2022 0445   CREATININE 0.85 07/31/2019 1607   CALCIUM 8.6 (L) 02/05/2022 0445   GFRNONAA >60 02/05/2022 0445   GFRAA >60 07/04/2019 0442     Discharge Instructions     Call MD for:  redness, tenderness, or signs of infection (pain, swelling, redness, odor or green/yellow discharge around incision site)   Complete by: As directed    Call MD for:  severe uncontrolled pain   Complete by: As directed    Call MD for:  temperature >100.4   Complete by: As directed    Diet - low sodium heart healthy   Complete by: As directed    Discharge wound care:   Complete by: As directed    Keep clean and dry. Keep ABD pad in groin incision and change if needed   Increase activity slowly   Complete by: As directed        Discharge Diagnosis:  Claudication ([place]) [I73.9] PAD (peripheral artery disease) ([place]) [I73.9]  Secondary Diagnosis: Patient Active  Problem List   Diagnosis Date Noted   Claudication ([place]) 02/04/2022   Pain in right foot 07/22/2020   Obesity, Class III, BMI 40-49.9 (morbid obesity) ([place]) 07/05/2019   Diabetic foot infection ([place]) 06/23/2019   PAD (peripheral artery disease) (H[facility])    Osteomyelitis of left foot (H[facility])    Vertebral artery stenosis, left 03/25/2019   VBI (vertebrobasilar insufficiency) 02/20/2019   Intracranial carotid stenosis, left 01/23/2019   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   Tobacco use disorder 01/02/2015   Cognitive impairment 01/02/2015   Stroke ([place]) 09/07/2012   Pain in upper limb 09/07/2012   Pain in lower limb 09/07/2012    Bipolar affective disorder ([place]) 09/07/2012   Depressive disorder 09/07/2012   Type II diabetes mellitus ([place]) 09/07/2012   Memory loss 09/07/2012   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   Personal history of noncompliance with medical treatment, presenting hazards to health 05/25/2012   Cerebral artery occlusion with cerebral infarction ([place]) 05/10/2012   Past Medical History:  Diagnosis Date   Anxiety    ARF (acute renal failure) ([place]) 2013   Bipolar disorder ([place])    Cerebral artery occlusion with cerebral infarction ([place]) 11/13   due to elevated blood sugar 1700-off insulin 6 months   Depression    Diabetes mellitus    off insulin 6 months   Diverticulitis    Dyspnea    when walking   GERD (gastroesophageal reflux disease)    Headache(784.0)    Hypertension    Kidney dialysis status    on dialysis(stroke) off after 4-6 weeks   Pneumonia 2013, 06/2018   Respiratory failure ([place]) 11/13   with stroke - in hosp [facility] 12 weeks   Seizures ([place])    during elvated blood sugsr episode 11/13   Sepsis ([place])    Stroke ([place])    11/2018, 12/2018 Weak left side, speech- slurred, Short term memeroy, Gait unsteady    Vocal cord dysfunction      Allergies as of 02/09/2022       Reactions   Contrast Media [iodinated Contrast Media] Other (See Comments)   2013-had CVA, went into AFR-combination ABT, contrast, low BP   Amitriptyline Other (See Comments)   Unresponsive    Compazine Other (See Comments)   Alert but unable to move   Morphine And Related Other (See Comments)   Chest pain   Oxycodone Nausea And Vomiting   Severe n/v -  patient will not take oxycodone or percocet.   Promethazine Swelling   Lips swell   Adhesive [tape] Rash   Long term exposure causes skin to tear    Sumatriptan Rash        Medication List     TAKE these medications    aspirin EC 81 MG tablet Take 81 mg by mouth daily.   atorvastatin 80 MG tablet Commonly known as: LIPITOR Take 1 tablet (80 mg  total) by mouth daily. What changed:  medication strength how much to take   blood glucose meter kit and supplies Dispense based on patient and insurance preference. Use up to four times daily as directed. (FOR ICD-10 E10.9, E11.9).   diazepam 5 MG tablet Commonly known as: VALIUM Take 5 mg by mouth 2 (two) times daily.   Farxiga 10 MG Tabs tablet Generic drug: dapagliflozin propanediol Take 10 mg by mouth daily.   ferrous gluconate 324 MG tablet Commonly known as: FERGON Take 324 mg by mouth daily.   FLUoxetine 10  MG capsule Commonly known as: PROZAC Take 10 mg by mouth daily.   gabapentin 300 MG capsule Commonly known as: NEURONTIN TAKE FOUR CAPSULES BY MOUTH THREE TIMES DAILY   HYDROcodone-acetaminophen 5-325 MG tablet Commonly known as: NORCO/VICODIN Take 1 tablet by mouth every 6 (six) hours as needed for severe pain.   labetalol 300 MG tablet Commonly known as: NORMODYNE Take 300 mg by mouth 2 (two) times daily.   levETIRAcetam 500 MG 24 hr tablet Commonly known as: KEPPRA XR Take 1,000 mg by mouth daily.   lisinopril 40 MG tablet Commonly known as: ZESTRIL Take 40 mg by mouth daily.   metFORMIN 500 MG tablet Commonly known as: GLUCOPHAGE Take 500 mg by mouth 3 (three) times daily.   methocarbamol 500 MG tablet Commonly known as: ROBAXIN Take 500 mg by mouth 2 (two) times daily.   omeprazole 20 MG capsule Commonly known as: PRILOSEC Take 20 mg by mouth daily.   risperiDONE 0.5 MG tablet Commonly known as: RISPERDAL Take 0.5 mg by mouth at bedtime.   tiZANidine 2 MG tablet Commonly known as: ZANAFLEX TAKE 1 TABLET BY MOUTH AT BEDTIME AS NEEDED FOR MUSCLE SPASMS   traMADol 50 MG tablet Commonly known as: ULTRAM Take 50 mg by mouth 4 (four) times daily.   traZODone 100 MG tablet Commonly known as: DESYREL Take 200 mg by mouth at bedtime.               Discharge Care Instructions  (From admission, onward)           Start      Ordered   02/09/22 0000  Discharge wound care:       Comments: Keep clean and dry. Keep ABD pad in groin incision and change if needed   02/09/22 0810            Discharge Instructions: [facility] Discharge instructions Lower Extremity Bypass Surgery  Please refer to the following instruction for your post-procedure care. Your surgeon or physician assistant will discuss any changes with you.  Activity  You are encouraged to walk as much as you can. You can slowly return to normal activities during the month after your surgery. Avoid strenuous activity and heavy lifting until your doctor tells you it's OK. Avoid activities such as vacuuming or swinging a golf club. Do not drive until your doctor give the OK and you are no longer taking prescription pain medications. It is also normal to have difficulty with sleep habits, eating and bowel movement after surgery. These will go away with time.  Bathing/Showering  You may shower after you go home. Do not soak in a bathtub, hot tub, or swim until the incision heals completely.  Incision Care  Clean your incision with mild soap and water. Shower every day. Pat the area dry with a clean towel. You do not need a bandage unless otherwise instructed. Do not apply any ointments or creams to your incision. If you have open wounds you will be instructed how to care for them or a visiting nurse may be arranged for you. If you have staples or sutures along your incision they will be removed at your post-op appointment. You may have skin glue on your incision. Do not peel it off. It will come off on its own in about one week.  Wash the groin wound with soap and water daily and pat dry. (No tub bath-only shower)  Then put a dry gauze or washcloth  in the groin to keep this area dry to help prevent wound infection.  Do this daily and as needed.  Do not use Vaseline or neosporin on your incisions.  Only use soap and water on  your incisions and then protect and keep dry.  Diet  Resume your normal diet. There are no special food restrictions following this procedure. A low fat/ low cholesterol diet is recommended for all patients with vascular disease. In order to heal from your surgery, it is CRITICAL to get adequate nutrition. Your body requires vitamins, minerals, and protein. Vegetables are the best source of vitamins and minerals. Vegetables also provide the perfect balance of protein. Processed food has little nutritional value, so try to avoid this.  Medications  Resume taking all your medications unless your doctor or Physician Assistant tells you not to. If your incision is causing pain, you may take over-the-counter pain relievers such as acetaminophen (Tylenol). If you were prescribed a stronger pain medication, please aware these medication can cause nausea and constipation. Prevent nausea by taking the medication with a snack or meal. Avoid constipation by drinking plenty of fluids and eating foods with high amount of fiber, such as fruits, vegetables, and grains. Take Colace 100 mg (an over-the-counter stool softener) twice a day as needed for constipation.  Do not take Tylenol if you are taking prescription pain medications.  Follow Up  Our office will schedule a follow up appointment 2-3 weeks following discharge.  Please call us immediately for any of the following conditions  Severe or worsening pain in your legs or feet while at rest or while walking Increase pain, redness, warmth, or drainage (pus) from your incision site(s) Fever of 101 degree or higher The swelling in your leg with the bypass suddenly worsens and becomes more painful than when you were in the hospital If you have been instructed to feel your graft pulse then you should do so every day. If you can no longer feel this pulse, call the office immediately. Not all patients are given this instruction.  Leg swelling is common after leg  bypass surgery.  The swelling should improve over a few months following surgery. To improve the swelling, you may elevate your legs above the level of your heart while you are sitting or resting. Your surgeon or physician assistant may ask you to apply an ACE wrap or wear compression (TED) stockings to help to reduce swelling.  Reduce your risk of vascular disease  Stop smoking. If you would like help call [facility] at 1-800-QUIT-NOW [phone]) or [place] at [phone].  Manage your cholesterol Maintain a desired weight Control your diabetes weight Control your diabetes Keep your blood pressure down  If you have any questions, please call the office at [phone]  Disposition: Home   Patient's condition: is Good  Follow up: 1. [facility] in 2-3 weeks   Vicente Serene, [place] [facility] [phone] 02/11/2022  9:18 AM  - For VQI Registry use ---   Post-op:  Wound infection: No  Graft infection: No  Transfusion: No    If yes,  units given New Arrhythmia: No Ipsilateral amputation: No, _0  Minor, _1  BKA, _2  AKA Discharge patency: [x ] Primary, _3  Primary assisted, _4  Secondary, _5  Occluded Patency judged by: [x ] Dopper only, _6  Palpable graft pulse, _7  Palpable distal pulse, _8  [name] inc. > 0.15, _9  Duplex D/C Ambulatory Status: Ambulatory  Complications: MI: No, <[id number]>_9<\/[id number]>_40  Troponin  only, _0  EKG or Clinical CHF: No Resp failure:No, _1  Pneumonia, _2  Ventilator Chg in renal function: No, _3  Inc. Cr > 0.5, _4  Temp. Dialysis,  _5  Permanent dialysis Stroke: No, _6  Minor, _7  Major Return to OR: No  Reason for return to OR: _8  Bleeding, _9  Infection, _10  Thrombosis, _11  Revision  Discharge medications: Statin use:  yes ASA use:  yes Plavix use:  no Beta blocker use: yes CCB use:  No ACEI use:   yes ARB use:  no Coumadin use: no

## 2022-02-13 ENCOUNTER — Other Ambulatory Visit: Payer: Self-pay | Admitting: Neurology

## 2022-02-22 ENCOUNTER — Other Ambulatory Visit: Payer: Self-pay | Admitting: Neurology

## 2022-02-28 ENCOUNTER — Ambulatory Visit (INDEPENDENT_AMBULATORY_CARE_PROVIDER_SITE_OTHER): Payer: Medicaid Other | Admitting: Physician Assistant

## 2022-02-28 VITALS — BP 109/68 | HR 80 | Temp 97.7°F | Resp 20 | Ht 64.5 in | Wt 196.5 lb

## 2022-02-28 DIAGNOSIS — I739 Peripheral vascular disease, unspecified: Secondary | ICD-10-CM

## 2022-02-28 MED ORDER — HYDROCODONE-ACETAMINOPHEN 5-325 MG PO TABS: 1.0000 | ORAL_TABLET | Freq: Four times a day (QID) | ORAL | 0 refills | Status: DC | PRN

## 2022-02-28 MED ORDER — SULFAMETHOXAZOLE-TRIMETHOPRIM 400-80 MG PO TABS: 1.0000 | ORAL_TABLET | Freq: Two times a day (BID) | ORAL | 0 refills | Status: AC

## 2022-02-28 NOTE — Progress Notes (Signed)
POST OPERATIVE OFFICE NOTE    CC:  F/u for surgery  HPI:  This is a 41 y.o. female who is s/p left common femoral to above-the-knee popliteal bypass with vein by Dr. Trula Slade on 02/04/2022 due to ischemic rest pain of the left foot.  Patient states rest pain has resolved in her foot however she is complaining of pain at the distalmost incision of her left leg.  She is able to ambulate without difficulty.  She has noticed some thin yellowish drainage from this distalmost incision which has been requiring dry dressing changes.  She denies fevers, chills, nausea/vomiting.  She believes the left groin incision has completely healed.  She is on aspirin and statin daily.  She denies tobacco use.  Allergies  Allergen Reactions   Contrast Media [Iodinated Contrast Media] Other (See Comments)    2013-had CVA, went into AFR-combination ABT, contrast, low BP   Amitriptyline Other (See Comments)    Unresponsive    Compazine Other (See Comments)    Alert but unable to move   Morphine And Related Other (See Comments)    Chest pain   Oxycodone Nausea And Vomiting    Severe n/v -  patient will not take oxycodone or percocet.   Prochlorperazine Maleate    Promethazine Swelling    Lips swell   Adhesive [Tape] Rash    Long term exposure causes skin to tear    Sumatriptan Rash    Current Outpatient Medications  Medication Sig Dispense Refill   aspirin 81 MG EC tablet Take 81 mg by mouth daily.      atorvastatin (LIPITOR) 80 MG tablet Take 1 tablet (80 mg total) by mouth daily. 30 tablet 2   blood glucose meter kit and supplies Dispense based on patient and insurance preference. Use up to four times daily as directed. (FOR ICD-10 E10.9, E11.9). 1 each 0   dapagliflozin propanediol (FARXIGA) 10 MG TABS tablet Take 10 mg by mouth daily.      diazepam (VALIUM) 5 MG tablet Take 5 mg by mouth 2 (two) times daily.      ferrous gluconate (FERGON) 324 MG tablet Take 324 mg by mouth daily.     FLUoxetine  (PROZAC) 10 MG capsule Take 10 mg by mouth daily.      gabapentin (NEURONTIN) 300 MG capsule TAKE FOUR CAPSULES BY MOUTH THREE TIMES DAILY 1080 capsule 3   labetalol (NORMODYNE) 300 MG tablet Take 300 mg by mouth 2 (two) times daily.     levETIRAcetam (KEPPRA XR) 500 MG 24 hr tablet Take 1,000 mg by mouth daily.     lisinopril (ZESTRIL) 40 MG tablet Take 40 mg by mouth daily.      metFORMIN (GLUCOPHAGE) 500 MG tablet Take 500 mg by mouth 3 (three) times daily.     methocarbamol (ROBAXIN) 500 MG tablet Take 500 mg by mouth 2 (two) times daily.     omeprazole (PRILOSEC) 20 MG capsule Take 20 mg by mouth daily.     risperiDONE (RISPERDAL) 0.5 MG tablet Take 0.5 mg by mouth at bedtime.     sulfamethoxazole-trimethoprim (BACTRIM) 400-80 MG tablet Take 1 tablet by mouth 2 (two) times daily for 10 days. 20 tablet 0   tiZANidine (ZANAFLEX) 2 MG tablet TAKE 1 TABLET BY MOUTH AT BEDTIME AS NEEDED FOR MUSCLE SPASMS 30 tablet 2   traMADol (ULTRAM) 50 MG tablet Take 50 mg by mouth 4 (four) times daily.     traZODone (DESYREL) 100 MG tablet Take 200  mg by mouth at bedtime.      HYDROcodone-acetaminophen (NORCO/VICODIN) 5-325 MG tablet Take 1 tablet by mouth every 6 (six) hours as needed for severe pain. 20 tablet 0   No current facility-administered medications for this visit.     ROS:  See HPI  Physical Exam:  Vitals:   02/28/22 1235  BP: 109/68  Pulse: 80  Resp: 20  Temp: 97.7 F (36.5 C)  TempSrc: Temporal  SpO2: 99%  Weight: 196 lb 8 oz (89.1 kg)  Height: 5' 4.5" (1.638 m)    Incision: Left groin incision healed; distalmost incision of the left leg with some dehiscence and somewhat firm to palpation.  No areas of fluctuance.  No surrounding erythema. Extremities: Palpable left DP pulse Neuro: A&O Abdomen:  soft     Assessment/Plan:  This is a 41 y.o. female who is s/p: Left common femoral to above-the-knee popliteal bypass with vein due to ischemic rest pain  -Well-perfused left  foot with palpable DP pulse.  Subjectively, rest pain of the left foot has resolved since surgery -She has a fluid collection at the above-the-knee popliteal incision with fullness to palpation.  She has had some thin yellowish drainage as well over the past several days.  We will proceed conservatively with a course of antibiotics.  She will also cleanse the wound with soap and water at least daily and pat to dry.  She will return to office in 1 week.  Patient was made aware if this incision worsens, she may require return to the operating room for washout/debridement and likely VAC placement. -She was prescribed 2-week course of Bactrim; patient was also prescribed narcotic pain medication for ongoing postoperative pain control   Dagoberto Ligas, PA-C [facility] [phone]  Clinic MD:  Trula Slade

## 2022-03-07 ENCOUNTER — Ambulatory Visit (INDEPENDENT_AMBULATORY_CARE_PROVIDER_SITE_OTHER): Payer: Medicaid Other | Admitting: Physician Assistant

## 2022-03-07 VITALS — BP 98/61 | HR 97 | Temp 98.0°F | Resp 14 | Ht 65.0 in | Wt 198.0 lb

## 2022-03-07 DIAGNOSIS — L7634 Postprocedural seroma of skin and subcutaneous tissue following other procedure: Secondary | ICD-10-CM

## 2022-03-07 DIAGNOSIS — I739 Peripheral vascular disease, unspecified: Secondary | ICD-10-CM

## 2022-03-07 MED ORDER — HYDROCODONE-ACETAMINOPHEN 5-325 MG PO TABS: 1.0000 | ORAL_TABLET | Freq: Four times a day (QID) | ORAL | 0 refills | Status: DC | PRN

## 2022-03-07 NOTE — Progress Notes (Signed)
POST OPERATIVE OFFICE NOTE    CC:  F/u for surgery  HPI:  This is a 41 y.o. female who is s/p left common femoral to above-the-knee popliteal bypass with vein by Dr. Trula Slade on 02/04/2022 due to ischemic rest pain of the left foot.  Her rest pain remains resolved post operatively. She was seen 2 weeks ago and was concerned about some drainage from her distal most incision and continued pain. She was placed prophylactically on antibiotics. She reports she has 3 more days of her antibiotic course to complete. She has a new area in the mid medial thigh incision that has a "knot". No further drainage from her incision. The two "knots" are tender and she complains of burning in the area. She is able to ambulate without difficulty. She denies fevers, chills, nausea/vomiting.   Allergies  Allergen Reactions   Contrast Media [Iodinated Contrast Media] Other (See Comments)    2013-had CVA, went into AFR-combination ABT, contrast, low BP   Amitriptyline Other (See Comments)    Unresponsive    Compazine Other (See Comments)    Alert but unable to move   Morphine And Related Other (See Comments)    Chest pain   Oxycodone Nausea And Vomiting    Severe n/v -  patient will not take oxycodone or percocet.   Prochlorperazine Maleate    Promethazine Swelling    Lips swell   Adhesive [Tape] Rash    Long term exposure causes skin to tear    Sumatriptan Rash    Current Outpatient Medications  Medication Sig Dispense Refill   aspirin 81 MG EC tablet Take 81 mg by mouth daily.      atorvastatin (LIPITOR) 80 MG tablet Take 1 tablet (80 mg total) by mouth daily. 30 tablet 2   blood glucose meter kit and supplies Dispense based on patient and insurance preference. Use up to four times daily as directed. (FOR ICD-10 E10.9, E11.9). 1 each 0   dapagliflozin propanediol (FARXIGA) 10 MG TABS tablet Take 10 mg by mouth daily.      diazepam (VALIUM) 5 MG tablet Take 5 mg by mouth 2 (two) times daily.      ferrous  gluconate (FERGON) 324 MG tablet Take 324 mg by mouth daily.     FLUoxetine (PROZAC) 10 MG capsule Take 10 mg by mouth daily.      gabapentin (NEURONTIN) 300 MG capsule TAKE FOUR CAPSULES BY MOUTH THREE TIMES DAILY 1080 capsule 3   HYDROcodone-acetaminophen (NORCO/VICODIN) 5-325 MG tablet Take 1 tablet by mouth every 6 (six) hours as needed for severe pain. 20 tablet 0   labetalol (NORMODYNE) 300 MG tablet Take 300 mg by mouth 2 (two) times daily.     levETIRAcetam (KEPPRA XR) 500 MG 24 hr tablet Take 1,000 mg by mouth daily.     lisinopril (ZESTRIL) 40 MG tablet Take 40 mg by mouth daily.      metFORMIN (GLUCOPHAGE) 500 MG tablet Take 500 mg by mouth 3 (three) times daily.     methocarbamol (ROBAXIN) 500 MG tablet Take 500 mg by mouth 2 (two) times daily.     omeprazole (PRILOSEC) 20 MG capsule Take 20 mg by mouth daily.     risperiDONE (RISPERDAL) 0.5 MG tablet Take 0.5 mg by mouth at bedtime.     sulfamethoxazole-trimethoprim (BACTRIM) 400-80 MG tablet Take 1 tablet by mouth 2 (two) times daily for 10 days. 20 tablet 0   tiZANidine (ZANAFLEX) 2 MG tablet TAKE 1 TABLET BY  MOUTH AT BEDTIME AS NEEDED FOR MUSCLE SPASMS 30 tablet 2   traMADol (ULTRAM) 50 MG tablet Take 50 mg by mouth 4 (four) times daily.     traZODone (DESYREL) 100 MG tablet Take 200 mg by mouth at bedtime.      No current facility-administered medications for this visit.     ROS:  See HPI  Physical Exam:  Vitals:   03/07/22 1354  BP: 98/61  Pulse: 97  Resp: 14  Temp: 98 F (36.7 C)  TempSrc: Temporal  SpO2: 99%  Weight: 198 lb (89.8 kg)  Height: _0  (1.651 m)   General: Well appearing, well nourished, in no acute distress Lungs: non labored Cardiac: regular Incision: left groin left upper thigh incisions well appearing, left mid thigh, left distal thigh have palpable areas of fullness likely small seromas. No signs of infection. No surrounding erythema. No warmth. Incisions are otherwise well appearing and  healing nicely Extremities:  well perfused and warm with easily palpable left DP Neuro: alert and oriented Abdomen:  obese, soft non distended  Assessment/Plan:  This is a 41 y.o. female who is s/p:Left common femoral to above-the-knee popliteal bypass with vein due to ischemic rest pain  - Her left leg remains very well perfused and warm with palpable DP - Her rest pain remains resolved - She has two areas of fluid collection in the mid medial thigh and distal most thigh incisions. There is no signs of infection. She is still taking Bactrim. Instructed her to continue and complete the full course - Will give her one last refill of her narcotic pain medication to get her to her next follow up. I have sent prescription to her pharmacy -will have her follow up in 2 weeks for wound check. I will have her follow up with Dr. Trula Slade so that if she needs a washout/ debridement in OR he can discuss this with her    Karoline Caldwell, PA-C [facility] [phone]  Clinic MD:  Trula Slade

## 2022-03-14 ENCOUNTER — Telehealth: Payer: Self-pay

## 2022-03-15 ENCOUNTER — Other Ambulatory Visit: Payer: Self-pay | Admitting: Physician Assistant

## 2022-03-15 DIAGNOSIS — L7634 Postprocedural seroma of skin and subcutaneous tissue following other procedure: Secondary | ICD-10-CM

## 2022-03-15 MED ORDER — TRAMADOL HCL 50 MG PO TABS: 50.0000 mg | ORAL_TABLET | Freq: Four times a day (QID) | ORAL | 0 refills | Status: AC | PRN

## 2022-03-17 NOTE — Progress Notes (Signed)
POST OPERATIVE OFFICE NOTE    CC:  F/u for surgery  HPI:  This is a 41 y.o. female who is s/p left common femoral to above-the-knee popliteal bypass with vein by Dr. Trula Slade on 02/04/2022 due to ischemic rest pain of the left foot.  Her rest pain remains resolved post operatively. She was initially seen post operatively with concerns about some drainage from her distal most incision and continued pain. She was placed prophylactically on antibiotics. She then was seen again two weeks later for follow up with concerns of a new area in the mid medial thigh incision that had a "knot". No further drainage from her incision. The two "knots" were tender and she complained of burning in the area. On exam there was no evidence of infection. Her left leg remained well perfused with palpable pulses. No surgical debridement indicated. She has been able to ambulate without difficulty. She was given refill of narcotic pain medication.  Patient recently called our office requesting more pain medication. She was advised that she would need to be seen first before she could have a refill. She presents with her mom. She reports today she is still having burning and discomfort in mid-proximal left thigh in area of "knot". Says this mostly bothers her on sitting certain ways. She denies any pain on ambulation. No pain in her feet. She denies fevers, chills, nausea/vomiting. She reports also that her back has been really hurting her. Says pain is daily and worse then pain in her legs but often occurs at same time.     Allergies  Allergen Reactions   Contrast Media [Iodinated Contrast Media] Other (See Comments)    2013-had CVA, went into AFR-combination ABT, contrast, low BP   Amitriptyline Other (See Comments)    Unresponsive    Compazine Other (See Comments)    Alert but unable to move   Morphine And Related Other (See Comments)    Chest pain   Oxycodone Nausea And Vomiting    Severe n/v -  patient will not take  oxycodone or percocet.   Prochlorperazine Maleate    Promethazine Swelling    Lips swell   Adhesive [Tape] Rash    Long term exposure causes skin to tear    Sumatriptan Rash    Current Outpatient Medications  Medication Sig Dispense Refill   aspirin 81 MG EC tablet Take 81 mg by mouth daily.      atorvastatin (LIPITOR) 80 MG tablet Take 1 tablet (80 mg total) by mouth daily. 30 tablet 2   blood glucose meter kit and supplies Dispense based on patient and insurance preference. Use up to four times daily as directed. (FOR ICD-10 E10.9, E11.9). 1 each 0   dapagliflozin propanediol (FARXIGA) 10 MG TABS tablet Take 10 mg by mouth daily.      diazepam (VALIUM) 5 MG tablet Take 5 mg by mouth 2 (two) times daily.      ferrous gluconate (FERGON) 324 MG tablet Take 324 mg by mouth daily.     FLUoxetine (PROZAC) 10 MG capsule Take 10 mg by mouth daily.      gabapentin (NEURONTIN) 300 MG capsule TAKE FOUR CAPSULES BY MOUTH THREE TIMES DAILY 1080 capsule 3   HYDROcodone-acetaminophen (NORCO/VICODIN) 5-325 MG tablet Take 1 tablet by mouth every 6 (six) hours as needed for severe pain. 20 tablet 0   labetalol (NORMODYNE) 300 MG tablet Take 300 mg by mouth 2 (two) times daily.     levETIRAcetam (KEPPRA XR) 500  MG 24 hr tablet Take 1,000 mg by mouth daily.     lisinopril (ZESTRIL) 40 MG tablet Take 40 mg by mouth daily.      metFORMIN (GLUCOPHAGE) 500 MG tablet Take 500 mg by mouth 3 (three) times daily.     methocarbamol (ROBAXIN) 500 MG tablet Take 500 mg by mouth 2 (two) times daily.     omeprazole (PRILOSEC) 20 MG capsule Take 20 mg by mouth daily.     risperiDONE (RISPERDAL) 0.5 MG tablet Take 0.5 mg by mouth at bedtime.     tiZANidine (ZANAFLEX) 2 MG tablet TAKE 1 TABLET BY MOUTH AT BEDTIME AS NEEDED FOR MUSCLE SPASMS 30 tablet 2   traMADol (ULTRAM) 50 MG tablet Take 50 mg by mouth 4 (four) times daily.     traMADol (ULTRAM) 50 MG tablet Take 1 tablet (50 mg total) by mouth every 6 (six) hours as  needed. 20 tablet 0   traZODone (DESYREL) 100 MG tablet Take 200 mg by mouth at bedtime.      No current facility-administered medications for this visit.     ROS:  See HPI  Physical Exam:  Vitals:   03/21/22 1056  BP: 126/78  Pulse: 83  Resp: 16  Temp: (!) 97.3 F (36.3 C)  TempSrc: Temporal  SpO2: 99%  Weight: 197 lb (89.4 kg)  Height: 5' 4.5" (1.638 m)    Incision:  incisions are all nicely healed. She has small seroma in medial upper thigh incision. No signs of infection. Non tender to palpation Extremities:  well perfused and warm with palpable DP Neuro: alert and oriented   Assessment/Plan:  This is a 41 y.o. female who is s/p: left femoral to above knee popliteal bypass with ipsilateral vein with application of left groin Prevena wound vac by Dr. Trula Slade on 02/04/22. This was performed secondary to rest pain in left leg.Left leg remains well perfused with palpable DP. Her rest pain remains resolved. She has some incisional discomfort in med medial thigh in area of seroma. The other seroma in distal thigh is now resolved. There is no signs of infect. - Discussed that her pain is really out of proportion to her exam.I discussed that we could make referral to pain clinic if most of her pain is back more so then her leg. She would like to follow up with her PCP regarding this - I will send her last Rx Norco #12 no refills. Told her that we cannot fill any further pain medication prescriptions. She can take Tylenol or Ibuprofen as needed - She is overdue for her 6 week post [name] and bypass graft duplex so I will bring her back for these in next 2-3 weeks   Karoline Caldwell, PA-C [facility] MD:  Trula Slade

## 2022-03-21 ENCOUNTER — Other Ambulatory Visit: Payer: Self-pay

## 2022-03-21 ENCOUNTER — Ambulatory Visit (INDEPENDENT_AMBULATORY_CARE_PROVIDER_SITE_OTHER): Payer: Medicaid Other | Admitting: Physician Assistant

## 2022-03-21 VITALS — BP 126/78 | HR 83 | Temp 97.3°F | Resp 16 | Ht 64.5 in | Wt 197.0 lb

## 2022-03-21 DIAGNOSIS — I70222 Atherosclerosis of native arteries of extremities with rest pain, left leg: Secondary | ICD-10-CM

## 2022-03-21 DIAGNOSIS — I739 Peripheral vascular disease, unspecified: Secondary | ICD-10-CM

## 2022-03-21 MED ORDER — HYDROCODONE-ACETAMINOPHEN 5-325 MG PO TABS
1.0000 | ORAL_TABLET | Freq: Four times a day (QID) | ORAL | 0 refills | Status: DC | PRN
Start: 2022-03-21 — End: 2023-10-17

## 2022-04-10 NOTE — Progress Notes (Unsigned)
POST OPERATIVE OFFICE NOTE    CC:  F/u for surgery  HPI:  This is a 41 y.o. female who is s/p left common femoral to above-the-knee popliteal bypass with vein by Dr. Trula Slade on 02/04/2022 due to ischemic rest pain of the left foot.    She was seen on 03/21/2022 and at that time, her rest pain was resolved.  When she was initially seen post op, there was some concerns for drainage from the distal most incision and continued pain. She was placed on prophylactic abx.  There was also some concerns for two "knots" that were tender and burning.  On exam, there was no evidence of infection.  She had palpable pulse.  She was  most recently seen due to requesting more pain medication.  She was still having some burning and discomfort in the mid proximal left thigh with a knot and it mostly bothered her while sitting certain ways.  She was not having pain with walking or pain in her feet.  She was not having any fever or chills or N/V.  It was felt that her pain was out of proportion to exam and felt a referral could be made to pain clinic if needed and we would not be able to prescribe more pain medication.    Pt returns today for 6 week follow up with vascular studies.  Pt states ***   Allergies  Allergen Reactions   Contrast Media [Iodinated Contrast Media] Other (See Comments)    2013-had CVA, went into AFR-combination ABT, contrast, low BP   Amitriptyline Other (See Comments)    Unresponsive    Compazine Other (See Comments)    Alert but unable to move   Morphine And Related Other (See Comments)    Chest pain   Oxycodone Nausea And Vomiting    Severe n/v -  patient will not take oxycodone or percocet.   Prochlorperazine Maleate    Promethazine Swelling    Lips swell   Adhesive [Tape] Rash    Long term exposure causes skin to tear    Sumatriptan Rash    Current Outpatient Medications  Medication Sig Dispense Refill   aspirin 81 MG EC tablet Take 81 mg by mouth daily.      atorvastatin  (LIPITOR) 80 MG tablet Take 1 tablet (80 mg total) by mouth daily. 30 tablet 2   blood glucose meter kit and supplies Dispense based on patient and insurance preference. Use up to four times daily as directed. (FOR ICD-10 E10.9, E11.9). 1 each 0   dapagliflozin propanediol (FARXIGA) 10 MG TABS tablet Take 10 mg by mouth daily.      diazepam (VALIUM) 5 MG tablet Take 5 mg by mouth 2 (two) times daily.      ferrous gluconate (FERGON) 324 MG tablet Take 324 mg by mouth daily.     FLUoxetine (PROZAC) 10 MG capsule Take 10 mg by mouth daily.      gabapentin (NEURONTIN) 300 MG capsule TAKE FOUR CAPSULES BY MOUTH THREE TIMES DAILY 1080 capsule 3   HYDROcodone-acetaminophen (NORCO) 5-325 MG tablet Take 1 tablet by mouth every 6 (six) hours as needed for severe pain. 12 tablet 0   HYDROcodone-acetaminophen (NORCO/VICODIN) 5-325 MG tablet Take 1 tablet by mouth every 6 (six) hours as needed for severe pain. 20 tablet 0   labetalol (NORMODYNE) 300 MG tablet Take 300 mg by mouth 2 (two) times daily.     levETIRAcetam (KEPPRA XR) 500 MG 24 hr tablet Take 1,000  mg by mouth daily.     lisinopril (ZESTRIL) 40 MG tablet Take 40 mg by mouth daily.      metFORMIN (GLUCOPHAGE) 500 MG tablet Take 500 mg by mouth 3 (three) times daily.     methocarbamol (ROBAXIN) 500 MG tablet Take 500 mg by mouth 2 (two) times daily.     omeprazole (PRILOSEC) 20 MG capsule Take 20 mg by mouth daily.     risperiDONE (RISPERDAL) 0.5 MG tablet Take 0.5 mg by mouth at bedtime.     tiZANidine (ZANAFLEX) 2 MG tablet TAKE 1 TABLET BY MOUTH AT BEDTIME AS NEEDED FOR MUSCLE SPASMS 30 tablet 2   traMADol (ULTRAM) 50 MG tablet Take 50 mg by mouth 4 (four) times daily.     traMADol (ULTRAM) 50 MG tablet Take 1 tablet (50 mg total) by mouth every 6 (six) hours as needed. 20 tablet 0   traZODone (DESYREL) 100 MG tablet Take 200 mg by mouth at bedtime.      No current facility-administered medications for this visit.     ROS:  See  HPI  Physical Exam:  ***  Incision:  *** Extremities:  *** Neuro: *** Abdomen:  ***  [name]/TBI 04/11/2022: Right:  *** Great toe pressure:  *** Left:  *** Great toe pressure***  Arterial duplex 04/11/2022: ***   Assessment/Plan:  This is a 41 y.o. female who is s/p: left common femoral to above-the-knee popliteal bypass with vein by Dr. Trula Slade on 02/04/2022 due to ischemic rest pain of the left foot.    -***   Leontine Locket, [facility] [phone]   Clinic MD:  Trula Slade

## 2022-04-11 ENCOUNTER — Ambulatory Visit (INDEPENDENT_AMBULATORY_CARE_PROVIDER_SITE_OTHER)
Admission: RE | Admit: 2022-04-11 | Discharge: 2022-04-11 | Disposition: A | Discharge status: Home / Self Care | Payer: Medicaid Other | Source: Ambulatory Visit | Attending: Surgery | Admitting: Surgery

## 2022-04-11 ENCOUNTER — Ambulatory Visit (INDEPENDENT_AMBULATORY_CARE_PROVIDER_SITE_OTHER): Payer: Medicaid Other | Admitting: Physician Assistant

## 2022-04-11 ENCOUNTER — Ambulatory Visit (HOSPITAL_COMMUNITY)
Admission: RE | Admit: 2022-04-11 | Discharge: 2022-04-11 | Disposition: A | Discharge status: Home / Self Care | Payer: Medicaid Other | Source: Ambulatory Visit | Attending: Surgery | Admitting: Surgery

## 2022-04-11 VITALS — BP 133/76 | HR 83 | Temp 98.0°F | Resp 20 | Ht 64.5 in | Wt 197.0 lb

## 2022-04-11 DIAGNOSIS — I739 Peripheral vascular disease, unspecified: Secondary | ICD-10-CM

## 2022-04-13 ENCOUNTER — Other Ambulatory Visit: Payer: Self-pay

## 2022-04-13 DIAGNOSIS — I739 Peripheral vascular disease, unspecified: Secondary | ICD-10-CM

## 2022-04-13 DIAGNOSIS — I70222 Atherosclerosis of native arteries of extremities with rest pain, left leg: Secondary | ICD-10-CM

## 2022-04-14 ENCOUNTER — Other Ambulatory Visit: Payer: Self-pay | Admitting: Surgery

## 2022-04-14 ENCOUNTER — Other Ambulatory Visit: Payer: Self-pay | Admitting: Neurology

## 2022-04-14 NOTE — Telephone Encounter (Signed)
PCP must take over prescribing this med due to blood work to check cholesterol levels

## 2022-04-18 ENCOUNTER — Other Ambulatory Visit: Payer: Self-pay | Admitting: Physician Assistant

## 2022-04-18 DIAGNOSIS — L7634 Postprocedural seroma of skin and subcutaneous tissue following other procedure: Secondary | ICD-10-CM

## 2022-04-25 ENCOUNTER — Other Ambulatory Visit: Payer: Self-pay | Admitting: Surgery

## 2022-04-26 ENCOUNTER — Telehealth: Payer: Self-pay | Admitting: Anesthesiology

## 2022-04-26 NOTE — Telephone Encounter (Signed)
Patient's mother called to schedule an appointment with Dr Posey Pronto. She would like to schedule a video visit. Last two appointments were video visits. Ok to schedule?

## 2022-04-27 NOTE — Telephone Encounter (Signed)
OK to offer virtual visit.

## 2022-05-02 ENCOUNTER — Encounter: Payer: Self-pay | Admitting: Neurology

## 2022-05-02 ENCOUNTER — Telehealth (INDEPENDENT_AMBULATORY_CARE_PROVIDER_SITE_OTHER): Payer: Medicaid Other | Admitting: Neurology

## 2022-05-02 VITALS — Wt 180.0 lb

## 2022-05-02 DIAGNOSIS — Z8673 Personal history of transient ischemic attack (TIA), and cerebral infarction without residual deficits: Secondary | ICD-10-CM

## 2022-05-02 DIAGNOSIS — G890 Central pain syndrome: Secondary | ICD-10-CM | POA: Diagnosis not present

## 2022-05-02 DIAGNOSIS — I639 Cerebral infarction, unspecified: Secondary | ICD-10-CM | POA: Diagnosis not present

## 2022-05-02 DIAGNOSIS — G40909 Epilepsy, unspecified, not intractable, without status epilepticus: Secondary | ICD-10-CM

## 2022-05-02 MED ORDER — GABAPENTIN 300 MG PO CAPS: ORAL_CAPSULE | ORAL | 3 refills | Status: DC

## 2022-05-02 MED ORDER — CLOPIDOGREL BISULFATE 75 MG PO TABS: 75.0000 mg | ORAL_TABLET | Freq: Every day | ORAL | 3 refills | Status: DC

## 2022-05-02 MED ORDER — LEVETIRACETAM ER 500 MG PO TB24
1000.0000 mg | ORAL_TABLET | Freq: Every day | ORAL | 3 refills | Status: DC
Start: 2022-05-02 — End: 2023-05-08

## 2022-05-02 NOTE — Progress Notes (Signed)
   Virtual Visit via Video Note The purpose of this virtual visit is to provide medical care while limiting exposure to the novel coronavirus.    Consent was obtained for video visit:  Yes.   Answered questions that patient had about telehealth interaction:  Yes.   I discussed the limitations, risks, security and privacy concerns of performing an evaluation and management service by telemedicine. I also discussed with the patient that there may be a patient responsible charge related to this service. The patient expressed understanding and agreed to proceed.  Pt location: Home Physician Location: office Name of referring provider:  Monico Blitz, MD I connected with Taylor [name] at patients initiation/request on 05/02/2022 at 10:30 AM EST by video enabled telemedicine application and verified that I am speaking with the correct person using two identifiers. Pt MRN:  [id number] Pt DOB:  1981-03-25 Video Participants:  Taylor [name]   History of Present Illness: This is a 41 y.o. 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  From a neurological standpoint, she has been doing well.  No new TIA, stroke, or seizures. In August, she underwent left femoral to above-the-knee popliteal bypass due to resting left foot pain and cramps.  Preoperatively, her plavix was stopped.  She believe that she is still taking the medication. Her cramps have improved.  She continues to have tingling over the left side of her body.  Gabapentin 1200mg  TID does help.    Observations/Objective:   Vitals:   05/02/22 1021  Weight: 180 lb (81.6 kg)   Patient is awake, alert, and appears comfortable.  Oriented x 4.   Extraocular muscles are intact. No ptosis.  Face is symmetric.  Speech is not dysarthric.  Antigravity in all extremities.  No pronator drift. Gait appears mildly wide-based, stable,  unassisted.    Lab Results  Component Value Date   HGBA1C 6.9 (H) 02/04/2022   Lab Results  Component Value Date   CHOL 119 02/05/2022   HDL 39 (L) 02/05/2022   LDLCALC 28 02/05/2022   TRIG 261 (H) 02/05/2022   CHOLHDL 3.1 02/05/2022     Assessment and Plan:  Intracranial stenosis with history of multiple strokes (R PCA and bithalamic stroke 2013, left thalamic stroke 12/2018, and left paramedial pontine stroke 11/2018).  She is s/p left vertebral stenting. She has known severe intracranial stenosis - LICA stenosis, left vertebrobasilar stneosis, and cavernous segment of RICA stenosis.  Risk factors include poorly controlled diabetes mellitus, hypertriglyceridemia.  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    - Restart plavix 75mg  (stopped for left femoral to above-the-knee popliteal bypass in August 2023)  - Continue aspirin 81mg   - Apprecaite PCP managing vascular risk factors (hypertension, diabetes, and hyperlipidemia)  Dejerine Roussy syndrome  - Continue gabapentin 1200mg  TID  3.  Seizure disorder, stable on Keppra XL 1000mg  daily   Follow Up Instructions:   I discussed the assessment and treatment plan with the patient. The patient was provided an opportunity to ask questions and all were answered. The patient agreed with the plan and demonstrated an understanding of the instructions.   The patient was advised to call back or seek an in-person evaluation if the symptoms worsen or if the condition fails to improve as anticipated.  Follow-up in 1 year   Alda Berthold, DO

## 2022-06-14 NOTE — Telephone Encounter (Signed)
No longer required.

## 2022-06-22 NOTE — Telephone Encounter (Signed)
Encounter opened in error

## 2022-07-14 ENCOUNTER — Other Ambulatory Visit: Payer: Self-pay | Admitting: Vascular Surgery

## 2022-09-08 ENCOUNTER — Telehealth: Payer: Self-pay

## 2022-09-08 DIAGNOSIS — I70223 Atherosclerosis of native arteries of extremities with rest pain, bilateral legs: Secondary | ICD-10-CM

## 2022-09-08 DIAGNOSIS — I739 Peripheral vascular disease, unspecified: Secondary | ICD-10-CM

## 2022-09-08 NOTE — Telephone Encounter (Signed)
Pt called c/o pain in the back of her legs, the same as what she had before her TMA.  Reviewed pt's chart, returned call for clarification, no answer, unable to leave vm.  Pt returned call, two identifiers used.   Triage Caller: Patient  Concern: with symptoms of cramping at HS  Location: left leg (worst), right leg  Pain Onset: associated with rest and ambulation, x 2 mo  Aggravating Factors: movement, walking, lying down  Description: cramping  Pain Relief Measures: none  Treatment Plan: Appointment scheduled 10/03/22, Instructed to call back if symptoms perist, and go to ED if symptoms become severe  Instructed to use OTC meds for pain relief, stay hydrated, and increase electrolyte consumption.  Next Appt: Appointment scheduled from recall for 10/03/22 for first available. Attempts made to sch [place] in [place], but no openings.

## 2022-10-03 ENCOUNTER — Ambulatory Visit (INDEPENDENT_AMBULATORY_CARE_PROVIDER_SITE_OTHER)
Admission: RE | Admit: 2022-10-03 | Discharge: 2022-10-03 | Disposition: A | Discharge status: Home / Self Care | Payer: Medicaid Other | Source: Ambulatory Visit | Attending: Surgery | Admitting: Surgery

## 2022-10-03 ENCOUNTER — Ambulatory Visit (HOSPITAL_COMMUNITY)
Admission: RE | Admit: 2022-10-03 | Discharge: 2022-10-03 | Disposition: A | Discharge status: Home / Self Care | Payer: Medicaid Other | Source: Ambulatory Visit | Attending: Surgery | Admitting: Surgery

## 2022-10-03 ENCOUNTER — Ambulatory Visit (INDEPENDENT_AMBULATORY_CARE_PROVIDER_SITE_OTHER): Payer: Medicaid Other | Admitting: Physician Assistant

## 2022-10-03 VITALS — BP 130/84 | HR 79 | Temp 97.2°F | Wt 202.0 lb

## 2022-10-03 DIAGNOSIS — I739 Peripheral vascular disease, unspecified: Secondary | ICD-10-CM | POA: Insufficient documentation

## 2022-10-03 DIAGNOSIS — I70222 Atherosclerosis of native arteries of extremities with rest pain, left leg: Secondary | ICD-10-CM | POA: Diagnosis present

## 2022-10-03 LAB — VAS US ABI WITH/WO TBI
Left ABI: 1.07
Right ABI: 0.94

## 2022-10-03 NOTE — Progress Notes (Signed)
Office Note     CC:  follow up Requesting Provider:  Kirstie Peri, MD  HPI: Taylor [name] is a 42 y.o. ([birth year]) female who presents for follow up of PAD. She is s/p left femoral to above knee popliteal bypass with ipsilateral vein by Dr. Myra Gianotti on 02/04/22 due to ischemic rest pain. She has had prior endovascular interventions on her LLE.   She was last seen in October of 2023. At that time she was continuing to have burning and aching pain in her legs. No foot pain. No tissue loss. Her non invasive studies were normal and her bypass graft duplex showed patency of her LLE bypass. She was encouraged to exercise, continue her smoking cessation and return for follow up in 6 months.   Today she complains of bilateral pain in her calves. Left worse than right. She says it hurts mostly at rest. Waking her up from sleep. Pains radiating down her legs from her back. She says the pain is not worse on ambulation. She describes it as something squeezing very tightly around her legs like " the blood flow is being cut off". She is taking Tramadol and Gabapentin for this and says it is not helping her pain. She says she is trying to walk more to help her circulation.   Past Medical History:  Diagnosis Date   Anxiety    ARF (acute renal failure) 2013   Bipolar disorder    Cerebral artery occlusion with cerebral infarction 11/13   due to elevated blood sugar 1700-off insulin 6 months   Depression    Diabetes mellitus    off insulin 6 months   Diverticulitis    Dyspnea    when walking   GERD (gastroesophageal reflux disease)    Headache(784.0)    Hypertension    Kidney dialysis status    on dialysis(stroke) off after 4-6 weeks   Pneumonia 2013, 06/2018   Respiratory failure 11/13   with stroke - in hosp [facility] 12 weeks   Seizures    during elvated blood sugsr episode 11/13   Sepsis    Stroke    11/2018, 12/2018 Weak left side, speech- slurred, Short term memeroy, Gait unsteady    Vocal cord  dysfunction     Past Surgical History:  Procedure Laterality Date   ABDOMINAL AORTOGRAM W/LOWER EXTREMITY N/A 06/25/2019   Procedure: ABDOMINAL AORTOGRAM W/LOWER EXTREMITY;  Surgeon: Nada Libman, MD;  Location: [facility];  Service: Cardiovascular;  Laterality: N/A;   ABDOMINAL AORTOGRAM W/LOWER EXTREMITY N/A 10/22/2019   Procedure: ABDOMINAL AORTOGRAM W/LOWER EXTREMITY;  Surgeon: Nada Libman, MD;  Location: [facility];  Service: Cardiovascular;  Laterality: N/A;   ABDOMINAL AORTOGRAM W/LOWER EXTREMITY Left 04/01/2020   Procedure: ABDOMINAL AORTOGRAM W/LOWER EXTREMITY;  Surgeon: Cephus Shelling, MD;  Location: [facility];  Service: Cardiovascular;  Laterality: Left;   ABDOMINAL AORTOGRAM W/LOWER EXTREMITY N/A 02/01/2022   Procedure: ABDOMINAL AORTOGRAM W/LOWER EXTREMITY;  Surgeon: Nada Libman, MD;  Location: [facility];  Service: Cardiovascular;  Laterality: N/A;   AMPUTATION Left 07/03/2019   Procedure: LEFT FOOT 4TH AND 5TH RAY AMPUTATION;  Surgeon: Nadara Mustard, MD;  Location: [facility] OR;  Service: Orthopedics;  Laterality: Left;   APPENDECTOMY     FEMORAL-POPLITEAL BYPASS GRAFT Left 02/04/2022   Procedure: LEFT FEMORAL-POPLITEAL ARTERY BYPASS;  Surgeon: Nada Libman, MD;  Location: [facility];  Service: Vascular;  Laterality: Left;   INSERTION OF DIALYSIS CATHETER  11/13   removed in 4-6 weeks   IR ANGIO INTRA EXTRACRAN SEL COM CAROTID INNOMINATE BILAT MOD SED  02/07/2019   IR ANGIO VERTEBRAL SEL VERTEBRAL BILAT MOD SED  02/07/2019   IR ANGIOGRAM EXTREMITY LEFT  02/07/2019   IR ANGIOGRAM EXTREMITY RIGHT  02/20/2019   IR TRANSCATH EXCRAN VERT OR CAR A STENT  02/20/2019   IR TRANSCATH EXCRAN VERT OR CAR A STENT  03/25/2019   IR US GUIDE VASC ACCESS RIGHT  02/07/2019   MULTIPLE EXTRACTIONS WITH ALVEOLOPLASTY N/A 02/24/2014   Procedure: MULTIPLE EXTRACTION WITH ALVEOLOPLASTY AND BIOPSY;  Surgeon: Georgia Lopes, DDS;  Location: [facility];  Service: Oral  Surgery;  Laterality: N/A;   PERIPHERAL VASCULAR ATHERECTOMY Left 06/25/2019   Procedure: PERIPHERAL VASCULAR ATHERECTOMY;  Surgeon: Nada Libman, MD;  Location: [facility];  Service: Cardiovascular;  Laterality: Left;  superficial femoral   PERIPHERAL VASCULAR BALLOON ANGIOPLASTY Left 10/22/2019   Procedure: PERIPHERAL VASCULAR BALLOON ANGIOPLASTY;  Surgeon: Nada Libman, MD;  Location: [facility];  Service: Cardiovascular;  Laterality: Left;  superficial femoral   PERIPHERAL VASCULAR INTERVENTION  04/01/2020   Procedure: PERIPHERAL VASCULAR INTERVENTION;  Surgeon: Cephus Shelling, MD;  Location: [facility];  Service: Cardiovascular;;  left SFA stent    RADIOLOGY WITH ANESTHESIA N/A 02/20/2019   Procedure: RADIOLOGY WITH ANESTHESIA   STENTING;  Surgeon: Julieanne Cotton, MD;  Location: [facility];  Service: Radiology;  Laterality: N/A;   RADIOLOGY WITH ANESTHESIA N/A 03/25/2019   Procedure: RADIOLOGY WITH ANESTHESIA STENTING;  Surgeon: Julieanne Cotton, MD;  Location: [facility];  Service: Radiology;  Laterality: N/A;   SKIN SPLIT GRAFT Left 07/03/2019   Procedure: APPLY SKIN GRAFT LEFT FOOT;  Surgeon: Nadara Mustard, MD;  Location[facility];  Service: Orthopedics;  Laterality: Left;   TRACHEOSTOMY  11/13   closed 1/14   TRANSRECTAL DRAINAGE OF PELVIC ABSCESS  12/11   VASCULAR SURGERY  04/01/2020   two stents placed in left leg    Social History   Socioeconomic History   Marital status: Single    Spouse name: Not on file   Number of children: 0   Years of education: 14   Highest education level: Not on file  Occupational History   Not on file  Tobacco Use   Smoking status: Former    Packs/day: 0.60    Years: 23.00    Additional pack years: 0.00    Total pack years: 13.80    Types: Cigarettes    Quit date: 02/05/2019    Years since quitting: 3.6    Passive exposure: Never   Smokeless tobacco: Never  Vaping Use   Vaping Use: Never used  Substance and  Sexual Activity   Alcohol use: No    Alcohol/week: 0.0 standard drinks of alcohol   Drug use: No   Sexual activity: Not Currently    Birth control/protection: None  Other Topics Concern   Not on file  Social History Narrative   She is single and does not have any children.    She has 2 yrs of college level education.   She has 5-6 caffeine drinks daily.    Left handed      Living with parents.   Social Determinants of Health   Financial Resource Strain: Not on file  Food Insecurity: Not on file  Transportation Needs: Not on file  Physical Activity: Not on file  Stress: Not on file  Social Connections: Not on  file  Intimate Partner Violence: Not on file    Family History  Problem Relation Age of Onset   Lung cancer Maternal Grandmother    Heart attack Maternal Grandfather    Diabetes Paternal Grandfather     Current Outpatient Medications  Medication Sig Dispense Refill   aspirin 81 MG EC tablet Take 81 mg by mouth daily.      atorvastatin (LIPITOR) 80 MG tablet TAKE 1 TABLET BY MOUTH DAILY 30 tablet 2   blood glucose meter kit and supplies Dispense based on patient and insurance preference. Use up to four times daily as directed. (FOR ICD-10 E10.9, E11.9). 1 each 0   clopidogrel (PLAVIX) 75 MG tablet Take 1 tablet (75 mg total) by mouth daily. 90 tablet 3   dapagliflozin propanediol (FARXIGA) 10 MG TABS tablet Take 10 mg by mouth daily.      diazepam (VALIUM) 5 MG tablet Take 5 mg by mouth 2 (two) times daily.      ferrous gluconate (FERGON) 324 MG tablet Take 324 mg by mouth daily.     FLUoxetine (PROZAC) 10 MG capsule Take 10 mg by mouth daily.      gabapentin (NEURONTIN) 300 MG capsule TAKE FOUR CAPSULES BY MOUTH THREE TIMES DAILY 1080 capsule 3   HYDROcodone-acetaminophen (NORCO) 5-325 MG tablet Take 1 tablet by mouth every 6 (six) hours as needed for severe pain. 12 tablet 0   HYDROcodone-acetaminophen (NORCO/VICODIN) 5-325 MG tablet Take 1 tablet by mouth every 6  (six) hours as needed for severe pain. 20 tablet 0   labetalol (NORMODYNE) 300 MG tablet Take 300 mg by mouth 2 (two) times daily.     levETIRAcetam (KEPPRA XR) 500 MG 24 hr tablet Take 2 tablets (1,000 mg total) by mouth daily. 180 tablet 3   lisinopril (ZESTRIL) 40 MG tablet Take 40 mg by mouth daily.      metFORMIN (GLUCOPHAGE) 500 MG tablet Take 500 mg by mouth 3 (three) times daily.     methocarbamol (ROBAXIN) 500 MG tablet Take 500 mg by mouth 2 (two) times daily.     omeprazole (PRILOSEC) 20 MG capsule Take 20 mg by mouth daily.     risperiDONE (RISPERDAL) 0.5 MG tablet Take 0.5 mg by mouth at bedtime.     tiZANidine (ZANAFLEX) 2 MG tablet TAKE 1 TABLET BY MOUTH AT BEDTIME AS NEEDED FOR MUSCLE SPASMS 30 tablet 2   traMADol (ULTRAM) 50 MG tablet Take 50 mg by mouth 4 (four) times daily.     traMADol (ULTRAM) 50 MG tablet Take 1 tablet (50 mg total) by mouth every 6 (six) hours as needed. 20 tablet 0   traZODone (DESYREL) 100 MG tablet Take 200 mg by mouth at bedtime.      No current facility-administered medications for this visit.    Allergies  Allergen Reactions   Contrast Media [Iodinated Contrast Media] Other (See Comments)    2013-had CVA, went into AFR-combination ABT, contrast, low BP   Amitriptyline Other (See Comments)    Unresponsive    Compazine Other (See Comments)    Alert but unable to move   Morphine And Related Other (See Comments)    Chest pain   Oxycodone Nausea And Vomiting    Severe n/v -  patient will not take oxycodone or percocet.   Prochlorperazine Maleate    Promethazine Swelling    Lips swell   Adhesive [Tape] Rash    Long term exposure causes skin to tear    Sumatriptan  Rash     REVIEW OF SYSTEMS:  [X]  denotes positive finding, [ ]  denotes negative finding Cardiac  Comments:  Chest pain or chest pressure:    Shortness of breath upon exertion:    Short of breath when lying flat:    Irregular heart rhythm:        Vascular    Pain in calf,  thigh, or hip brought on by ambulation:    Pain in feet at night that wakes you up from your sleep:     Blood clot in your veins:    Leg swelling:         Pulmonary    Oxygen at home:    Productive cough:     Wheezing:         Neurologic    Sudden weakness in arms or legs:     Sudden numbness in arms or legs:     Sudden onset of difficulty speaking or slurred speech:    Temporary loss of vision in one eye:     Problems with dizziness:         Gastrointestinal    Blood in stool:     Vomited blood:         Genitourinary    Burning when urinating:     Blood in urine:        Psychiatric    Major depression:         Hematologic    Bleeding problems:    Problems with blood clotting too easily:        Skin    Rashes or ulcers:        Constitutional    Fever or chills:      PHYSICAL EXAMINATION:  Vitals:   10/03/22 1124  BP: 130/84  Pulse: 79  Temp: (!) 97.2 F (36.2 C)  TempSrc: Temporal  SpO2: 97%  Weight: 202 lb (91.6 kg)    General:  WDWN in NAD; vital signs documented above Gait: Normal HENT: WNL, normocephalic Pulmonary: normal non-labored breathing , without  wheezing Cardiac: regular HR Abdomen: soft, NT, no masses Vascular Exam/Pulses: 2+ femoral, Doppler popliteal signals. Brisk triphasic DP/PT/Peroneal signals. DP palpable bilaterally. Feet warm and well perfused Extremities: without ischemic changes, without Gangrene , without cellulitis; without open wounds;  Musculoskeletal: no muscle wasting or atrophy  Neurologic: A&O X 3 Psychiatric:  The pt has normal affect.   Non-Invasive Vascular Imaging:   +-------+-----------+-----------+------------+------------+  [name]/TBIToday's ABIToday's TBIPrevious ABIPrevious TBI  +-------+-----------+-----------+------------+------------+  Right 0.94       0.82       0.90        0.57          +-------+-----------+-----------+------------+------------+  Left  1.07       0.93       1.10         1.01          +-------+-----------+-----------+------------+------------+    VAS [place] Lower extremity bypass graft:  Left Graft #1: Left femoral-popliteal artery bypass  +--------------------+--------+--------+---------+--------+                     PSV cm/sStenosisWaveform Comments  +--------------------+--------+--------+---------+--------+  Inflow             85              biphasic           +--------------------+--------+--------+---------+--------+  Proximal Anastomosis63              biphasic           +--------------------+--------+--------+---------+--------+  Proximal Graft      65              biphasic           +--------------------+--------+--------+---------+--------+  Mid Graft           80              biphasic           +--------------------+--------+--------+---------+--------+  Distal Graft        96              biphasic           +--------------------+--------+--------+---------+--------+  Distal Anastomosis  78              biphasic           +--------------------+--------+--------+---------+--------+  Outflow            64              triphasic          +--------------------+--------+--------+---------+--------+    Summary:  Left: Patent femoral-popliteal artery bypass graft.    ASSESSMENT/PLAN:: 42 y.o. female here for follow up for PAD. She is s/p left femoral to above knee popliteal bypass with ipsilateral vein by Dr. Myra Gianotti on 02/04/22 due to ischemic rest pain. She has had prior endovascular interventions on her LLE. She is currently having lower extremity pain however I do not feel this is vascular in nature. She has palpable pulses in both her feet. He non invasive studies are also normal today. I have suggested that she may need to be referred to Ortho Spine for evaluation and also pain management.  - her [name] today is actually improved on the RLE and essentially unchanged on the left - Her duplex today  shows patent left femoral to popliteal bypass - Had a long discussion with her that we are unable to provide her with any narcotic pain medications. She has not had any recent surgery by [place]. She has healed well from her prior bypass surgery. Her studies today and also clinical exam do not suggest that her blood flow is causing her symptoms - She stated that she did not want to follow up with [place] any longer if we were not willing to give her any pain medication - I recommended a 6 months follow up with repeat [name] and LLE bypass graft duplex but patient did not want to make a follow up appointment   Graceann Congress, PA-C [facility] [phone]  Clinic MD:   Myra Gianotti

## 2022-10-10 ENCOUNTER — Other Ambulatory Visit: Payer: Self-pay | Admitting: Vascular Surgery

## 2023-01-08 ENCOUNTER — Other Ambulatory Visit: Payer: Self-pay | Admitting: Vascular Surgery

## 2023-02-06 ENCOUNTER — Other Ambulatory Visit: Payer: Self-pay | Admitting: Vascular Surgery

## 2023-05-08 ENCOUNTER — Encounter: Payer: Self-pay | Admitting: Neurology

## 2023-05-08 ENCOUNTER — Telehealth: Payer: Medicaid Other | Admitting: Neurology

## 2023-05-08 ENCOUNTER — Other Ambulatory Visit: Payer: Self-pay | Admitting: Neurology

## 2023-05-08 VITALS — Ht 64.5 in

## 2023-05-08 DIAGNOSIS — G40909 Epilepsy, unspecified, not intractable, without status epilepticus: Secondary | ICD-10-CM | POA: Diagnosis not present

## 2023-05-08 DIAGNOSIS — I639 Cerebral infarction, unspecified: Secondary | ICD-10-CM

## 2023-05-08 DIAGNOSIS — G890 Central pain syndrome: Secondary | ICD-10-CM

## 2023-05-08 MED ORDER — PREGABALIN 150 MG PO CAPS
150.0000 mg | ORAL_CAPSULE | Freq: Three times a day (TID) | ORAL | 5 refills | Status: DC
Start: 2023-05-08 — End: 2023-10-09

## 2023-05-08 MED ORDER — CLOPIDOGREL BISULFATE 75 MG PO TABS: 75.0000 mg | ORAL_TABLET | Freq: Every day | ORAL | 3 refills | Status: DC

## 2023-05-08 MED ORDER — LEVETIRACETAM ER 500 MG PO TB24: 1000.0000 mg | ORAL_TABLET | Freq: Every day | ORAL | 3 refills | Status: DC

## 2023-05-08 NOTE — Progress Notes (Signed)
   Virtual Visit via Video Note The purpose of this virtual visit is to provide medical care while limiting exposure to the novel coronavirus.    Consent was obtained for video visit:  Yes.   Answered questions that patient had about telehealth interaction:  Yes.   I discussed the limitations, risks, security and privacy concerns of performing an evaluation and management service by telemedicine. I also discussed with the patient that there may be a patient responsible charge related to this service. The patient expressed understanding and agreed to proceed.  Pt location: Home Physician Location: office Name of referring provider:  Kirstie Peri, MD I connected with Rodena Piety at patients initiation/request on 05/08/2023 at 10:30 AM EST by video enabled telemedicine application and verified that I am speaking with the correct person using two identifiers. Pt MRN:  [id number] Pt DOB:  Jan 03, 1981 Video Participants:  Rodena Piety   History of Present Illness: This is a 42 y.o. 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  She reports doing well over the past year with no new hospitalizations or seizures.  She has been compliant with her plavix and aspirin.  She saw her PCP in September and asked about trying Lyrica, so her gabapentin was stopped.  She is taking Lyrica 150mg  BID but feels that gabapentin controlled her pain better. She was taking gabapentin 1200mg  TID.   Observations/Objective:   Vitals:   05/08/23 0930  Height: 5' 4.5" (1.638 m)   Patient is awake, alert, and appears comfortable.  Oriented x 4.   Extraocular muscles are intact. No ptosis.  Face is symmetric.  Speech is not dysarthric.  Antigravity in all extremities.  No pronator drift. Gait appears mildly wide-based, stable, unassisted.    Lab Results  Component  Value Date   HGBA1C 6.9 (H) 02/04/2022   Lab Results  Component Value Date   CHOL 119 02/05/2022   HDL 39 (L) 02/05/2022   LDLCALC 28 02/05/2022   TRIG 261 (H) 02/05/2022   CHOLHDL 3.1 02/05/2022     Assessment and Plan:  Intracranial stenosis with history of multiple strokes (R PCA and bithalamic stroke 2013, left thalamic stroke 12/2018, and left paramedial pontine stroke 11/2018).  She is s/p left vertebral stenting. She has known severe intracranial stenosis - LICA stenosis, left vertebrobasilar stenosis, and cavernous segment of RICA stenosis.  Risk factors include poorly controlled diabetes mellitus, hypertriglyceridemia.  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    - Continue plavix 75mg  and aspirin 81mg  due to severe vascular disease  - PCP managing hypertension, diabetes, and hyperlipidemia  Dejerine Roussy syndrome with refractory pain. - She has been on gabapentin (3600mg /d), cymbalta, opioids - Increase Lyrica to 150mg  TID  3.  Seizure disorder, stable on Keppra XL 1000mg  daily   Follow Up Instructions:   I discussed the assessment and treatment plan with the patient. The patient was provided an opportunity to ask questions and all were answered. The patient agreed with the plan and demonstrated an understanding of the instructions.    The patient was advised to call back or seek an in-person evaluation if the symptoms worsen or if the condition fails to improve as anticipated.  Follow-up in 1 year   Glendale Chard, DO

## 2023-08-23 DIAGNOSIS — E66812 Obesity, class 2: Secondary | ICD-10-CM | POA: Insufficient documentation

## 2023-08-23 DIAGNOSIS — Z9181 History of falling: Secondary | ICD-10-CM

## 2023-10-08 ENCOUNTER — Other Ambulatory Visit: Payer: Self-pay | Admitting: Neurology

## 2023-10-17 ENCOUNTER — Inpatient Hospital Stay (HOSPITAL_COMMUNITY)
Admission: AD | Admit: 2023-10-17 | Discharge: 2023-10-26 | DRG: 981 | Disposition: A | Discharge status: Home / Self Care | Source: Other Acute Inpatient Hospital | Attending: Student | Admitting: Student

## 2023-10-17 ENCOUNTER — Other Ambulatory Visit: Payer: Self-pay

## 2023-10-17 ENCOUNTER — Encounter (HOSPITAL_COMMUNITY): Payer: Self-pay

## 2023-10-17 ENCOUNTER — Encounter (HOSPITAL_COMMUNITY): Payer: Self-pay | Admitting: Cardiology

## 2023-10-17 DIAGNOSIS — I200 Unstable angina: Secondary | ICD-10-CM

## 2023-10-17 DIAGNOSIS — E1151 Type 2 diabetes mellitus with diabetic peripheral angiopathy without gangrene: Secondary | ICD-10-CM | POA: Diagnosis not present

## 2023-10-17 DIAGNOSIS — Z9181 History of falling: Secondary | ICD-10-CM

## 2023-10-17 DIAGNOSIS — I739 Peripheral vascular disease, unspecified: Secondary | ICD-10-CM | POA: Diagnosis present

## 2023-10-17 DIAGNOSIS — R569 Unspecified convulsions: Secondary | ICD-10-CM

## 2023-10-17 DIAGNOSIS — D509 Iron deficiency anemia, unspecified: Secondary | ICD-10-CM | POA: Diagnosis not present

## 2023-10-17 DIAGNOSIS — Z79899 Other long term (current) drug therapy: Secondary | ICD-10-CM | POA: Diagnosis not present

## 2023-10-17 DIAGNOSIS — J440 Chronic obstructive pulmonary disease with acute lower respiratory infection: Secondary | ICD-10-CM | POA: Diagnosis present

## 2023-10-17 DIAGNOSIS — T502X5A Adverse effect of carbonic-anhydrase inhibitors, benzothiadiazides and other diuretics, initial encounter: Secondary | ICD-10-CM | POA: Diagnosis present

## 2023-10-17 DIAGNOSIS — G40909 Epilepsy, unspecified, not intractable, without status epilepticus: Secondary | ICD-10-CM | POA: Diagnosis not present

## 2023-10-17 DIAGNOSIS — I700 Atherosclerosis of aorta: Secondary | ICD-10-CM | POA: Diagnosis not present

## 2023-10-17 DIAGNOSIS — F410 Panic disorder [episodic paroxysmal anxiety] without agoraphobia: Secondary | ICD-10-CM | POA: Diagnosis present

## 2023-10-17 DIAGNOSIS — D649 Anemia, unspecified: Secondary | ICD-10-CM | POA: Diagnosis present

## 2023-10-17 DIAGNOSIS — I110 Hypertensive heart disease with heart failure: Secondary | ICD-10-CM | POA: Diagnosis not present

## 2023-10-17 DIAGNOSIS — Z91048 Other nonmedicinal substance allergy status: Secondary | ICD-10-CM

## 2023-10-17 DIAGNOSIS — E875 Hyperkalemia: Secondary | ICD-10-CM | POA: Diagnosis not present

## 2023-10-17 DIAGNOSIS — Z888 Allergy status to other drugs, medicaments and biological substances status: Secondary | ICD-10-CM

## 2023-10-17 DIAGNOSIS — I25110 Atherosclerotic heart disease of native coronary artery with unstable angina pectoris: Secondary | ICD-10-CM

## 2023-10-17 DIAGNOSIS — E1165 Type 2 diabetes mellitus with hyperglycemia: Secondary | ICD-10-CM | POA: Diagnosis not present

## 2023-10-17 DIAGNOSIS — J9601 Acute respiratory failure with hypoxia: Secondary | ICD-10-CM | POA: Diagnosis not present

## 2023-10-17 DIAGNOSIS — Z6833 Body mass index (BMI) 33.0-33.9, adult: Secondary | ICD-10-CM

## 2023-10-17 DIAGNOSIS — N179 Acute kidney failure, unspecified: Secondary | ICD-10-CM | POA: Diagnosis not present

## 2023-10-17 DIAGNOSIS — Z7901 Long term (current) use of anticoagulants: Secondary | ICD-10-CM

## 2023-10-17 DIAGNOSIS — Z794 Long term (current) use of insulin: Secondary | ICD-10-CM

## 2023-10-17 DIAGNOSIS — I2510 Atherosclerotic heart disease of native coronary artery without angina pectoris: Secondary | ICD-10-CM

## 2023-10-17 DIAGNOSIS — Z7902 Long term (current) use of antithrombotics/antiplatelets: Secondary | ICD-10-CM

## 2023-10-17 DIAGNOSIS — K219 Gastro-esophageal reflux disease without esophagitis: Secondary | ICD-10-CM | POA: Diagnosis present

## 2023-10-17 DIAGNOSIS — E785 Hyperlipidemia, unspecified: Secondary | ICD-10-CM | POA: Diagnosis not present

## 2023-10-17 DIAGNOSIS — R072 Precordial pain: Secondary | ICD-10-CM

## 2023-10-17 DIAGNOSIS — I509 Heart failure, unspecified: Secondary | ICD-10-CM | POA: Diagnosis not present

## 2023-10-17 DIAGNOSIS — I255 Ischemic cardiomyopathy: Secondary | ICD-10-CM | POA: Diagnosis not present

## 2023-10-17 DIAGNOSIS — I69398 Other sequelae of cerebral infarction: Secondary | ICD-10-CM

## 2023-10-17 DIAGNOSIS — Z87891 Personal history of nicotine dependence: Secondary | ICD-10-CM

## 2023-10-17 DIAGNOSIS — R079 Chest pain, unspecified: Principal | ICD-10-CM | POA: Diagnosis present

## 2023-10-17 DIAGNOSIS — G890 Central pain syndrome: Secondary | ICD-10-CM | POA: Diagnosis present

## 2023-10-17 DIAGNOSIS — J9690 Respiratory failure, unspecified, unspecified whether with hypoxia or hypercapnia: Secondary | ICD-10-CM | POA: Diagnosis present

## 2023-10-17 DIAGNOSIS — F32A Depression, unspecified: Secondary | ICD-10-CM | POA: Diagnosis present

## 2023-10-17 DIAGNOSIS — I959 Hypotension, unspecified: Secondary | ICD-10-CM | POA: Diagnosis not present

## 2023-10-17 DIAGNOSIS — Z992 Dependence on renal dialysis: Secondary | ICD-10-CM | POA: Diagnosis not present

## 2023-10-17 DIAGNOSIS — M545 Low back pain, unspecified: Secondary | ICD-10-CM | POA: Diagnosis present

## 2023-10-17 DIAGNOSIS — Z8249 Family history of ischemic heart disease and other diseases of the circulatory system: Secondary | ICD-10-CM

## 2023-10-17 DIAGNOSIS — J181 Lobar pneumonia, unspecified organism: Secondary | ICD-10-CM | POA: Diagnosis not present

## 2023-10-17 DIAGNOSIS — I5022 Chronic systolic (congestive) heart failure: Secondary | ICD-10-CM | POA: Diagnosis not present

## 2023-10-17 DIAGNOSIS — Z833 Family history of diabetes mellitus: Secondary | ICD-10-CM

## 2023-10-17 DIAGNOSIS — Z7984 Long term (current) use of oral hypoglycemic drugs: Secondary | ICD-10-CM | POA: Diagnosis not present

## 2023-10-17 DIAGNOSIS — Z955 Presence of coronary angioplasty implant and graft: Principal | ICD-10-CM

## 2023-10-17 DIAGNOSIS — E1100 Type 2 diabetes mellitus with hyperosmolarity without nonketotic hyperglycemic-hyperosmolar coma (NKHHC): Secondary | ICD-10-CM | POA: Diagnosis not present

## 2023-10-17 DIAGNOSIS — E66811 Obesity, class 1: Secondary | ICD-10-CM | POA: Diagnosis not present

## 2023-10-17 DIAGNOSIS — R413 Other amnesia: Secondary | ICD-10-CM | POA: Diagnosis present

## 2023-10-17 DIAGNOSIS — Z7982 Long term (current) use of aspirin: Secondary | ICD-10-CM

## 2023-10-17 DIAGNOSIS — F319 Bipolar disorder, unspecified: Secondary | ICD-10-CM | POA: Diagnosis present

## 2023-10-17 DIAGNOSIS — J189 Pneumonia, unspecified organism: Secondary | ICD-10-CM | POA: Diagnosis present

## 2023-10-17 DIAGNOSIS — Z91041 Radiographic dye allergy status: Secondary | ICD-10-CM

## 2023-10-17 DIAGNOSIS — Z885 Allergy status to narcotic agent status: Secondary | ICD-10-CM

## 2023-10-17 DIAGNOSIS — E119 Type 2 diabetes mellitus without complications: Secondary | ICD-10-CM

## 2023-10-17 DIAGNOSIS — Z801 Family history of malignant neoplasm of trachea, bronchus and lung: Secondary | ICD-10-CM

## 2023-10-17 LAB — RESPIRATORY PANEL BY PCR

## 2023-10-17 LAB — CBC
HCT: 32.5 % — ABNORMAL LOW (ref 36.0–46.0)
Hemoglobin: 9.6 g/dL — ABNORMAL LOW (ref 12.0–15.0)
MCH: 20.2 pg — ABNORMAL LOW (ref 26.0–34.0)
MCHC: 29.5 g/dL — ABNORMAL LOW (ref 30.0–36.0)
MCV: 68.3 fL — ABNORMAL LOW (ref 80.0–100.0)
Platelets: 262 10*3/uL (ref 150–400)
RBC: 4.76 MIL/uL (ref 3.87–5.11)
RDW: 18.2 % — ABNORMAL HIGH (ref 11.5–15.5)
WBC: 19.5 10*3/uL — ABNORMAL HIGH (ref 4.0–10.5)
nRBC: 0 % (ref 0.0–0.2)

## 2023-10-17 LAB — TSH: TSH: 0.725 u[IU]/mL (ref 0.350–4.500)

## 2023-10-17 LAB — HEMOGLOBIN A1C
Hgb A1c MFr Bld: 8 % — ABNORMAL HIGH (ref 4.8–5.6)
Mean Plasma Glucose: 182.9 mg/dL

## 2023-10-17 LAB — MRSA NEXT GEN BY PCR, NASAL: MRSA by PCR Next Gen: NOT DETECTED

## 2023-10-17 LAB — BASIC METABOLIC PANEL WITH GFR
Anion gap: 14 (ref 5–15)
BUN: 23 mg/dL — ABNORMAL HIGH (ref 6–20)
CO2: 26 mmol/L (ref 22–32)
Calcium: 9.6 mg/dL (ref 8.9–10.3)
Chloride: 93 mmol/L — ABNORMAL LOW (ref 98–111)
Creatinine, Ser: 1.23 mg/dL — ABNORMAL HIGH (ref 0.44–1.00)
GFR, Estimated: 56 mL/min — ABNORMAL LOW (ref >60)
Glucose, Bld: 422 mg/dL — ABNORMAL HIGH (ref 70–99)
Potassium: 5.5 mmol/L — ABNORMAL HIGH (ref 3.5–5.1)
Sodium: 133 mmol/L — ABNORMAL LOW (ref 135–145)

## 2023-10-17 LAB — HEPATIC FUNCTION PANEL
ALT: 26 U/L (ref 0–44)
AST: 36 U/L (ref 15–41)
Albumin: 3.6 g/dL (ref 3.5–5.0)
Alkaline Phosphatase: 76 U/L (ref 38–126)
Bilirubin, Direct: 0.1 mg/dL (ref 0.0–0.2)
Indirect Bilirubin: 0.5 mg/dL (ref 0.3–0.9)
Total Bilirubin: 0.6 mg/dL (ref 0.0–1.2)
Total Protein: 7.1 g/dL (ref 6.5–8.1)

## 2023-10-17 LAB — HIV ANTIBODY (ROUTINE TESTING W REFLEX): HIV Screen 4th Generation wRfx: NONREACTIVE

## 2023-10-17 LAB — GLUCOSE, CAPILLARY
Glucose-Capillary: 453 mg/dL — ABNORMAL HIGH (ref 70–99)
Glucose-Capillary: 377 mg/dL — ABNORMAL HIGH (ref 70–99)
Glucose-Capillary: 464 mg/dL — ABNORMAL HIGH (ref 70–99)
Glucose-Capillary: 388 mg/dL — ABNORMAL HIGH (ref 70–99)

## 2023-10-17 LAB — BRAIN NATRIURETIC PEPTIDE: B Natriuretic Peptide: 353.2 pg/mL — ABNORMAL HIGH (ref 0.0–100.0)

## 2023-10-17 MED ORDER — INSULIN GLARGINE-YFGN 100 UNIT/ML ~~LOC~~ SOLN
10.0000 [IU] | Freq: Once | SUBCUTANEOUS | Status: DC
  Filled 2023-10-17: qty 0.1

## 2023-10-17 MED ORDER — DAPAGLIFLOZIN PROPANEDIOL 10 MG PO TABS: 10.0000 mg | ORAL_TABLET | Freq: Every day | ORAL | Status: DC

## 2023-10-17 MED ORDER — INSULIN ASPART 100 UNIT/ML IJ SOLN
0.0000 [IU] | Freq: Three times a day (TID) | INTRAMUSCULAR | Status: DC
  Administered 2023-10-18: 11 [IU] via SUBCUTANEOUS
  Administered 2023-10-18: 20 [IU] via SUBCUTANEOUS
  Administered 2023-10-18: 7 [IU] via SUBCUTANEOUS
  Administered 2023-10-19: 11 [IU] via SUBCUTANEOUS
  Administered 2023-10-19: 7 [IU] via SUBCUTANEOUS
  Administered 2023-10-20: 11 [IU] via SUBCUTANEOUS
  Administered 2023-10-20: 7 [IU] via SUBCUTANEOUS
  Administered 2023-10-20: 11 [IU] via SUBCUTANEOUS
  Administered 2023-10-21: 4 [IU] via SUBCUTANEOUS
  Administered 2023-10-21: 15 [IU] via SUBCUTANEOUS
  Administered 2023-10-21: 11 [IU] via SUBCUTANEOUS
  Administered 2023-10-22 (×2): 7 [IU] via SUBCUTANEOUS
  Administered 2023-10-23: 4 [IU] via SUBCUTANEOUS
  Administered 2023-10-23 (×2): 11 [IU] via SUBCUTANEOUS
  Administered 2023-10-24 (×2): 4 [IU] via SUBCUTANEOUS
  Administered 2023-10-25 (×2): 11 [IU] via SUBCUTANEOUS
  Administered 2023-10-25: 4 [IU] via SUBCUTANEOUS
  Administered 2023-10-26: 3 [IU] via SUBCUTANEOUS
  Administered 2023-10-26: 4 [IU] via SUBCUTANEOUS

## 2023-10-17 MED ORDER — FLUOXETINE HCL 10 MG PO CAPS
10.0000 mg | ORAL_CAPSULE | Freq: Every day | ORAL | Status: DC
  Administered 2023-10-17 – 2023-10-26 (×10): 10 mg via ORAL
  Filled 2023-10-17 (×10): qty 1

## 2023-10-17 MED ORDER — LABETALOL HCL 200 MG PO TABS: 300.0000 mg | ORAL_TABLET | Freq: Two times a day (BID) | ORAL | Status: DC

## 2023-10-17 MED ORDER — ALBUTEROL SULFATE (2.5 MG/3ML) 0.083% IN NEBU
2.5000 mg | INHALATION_SOLUTION | Freq: Three times a day (TID) | RESPIRATORY_TRACT | Status: DC
  Administered 2023-10-17 – 2023-10-18 (×2): 2.5 mg via RESPIRATORY_TRACT
  Filled 2023-10-17 (×2): qty 3

## 2023-10-17 MED ORDER — INSULIN GLARGINE-YFGN 100 UNIT/ML ~~LOC~~ SOLN
5.0000 [IU] | Freq: Two times a day (BID) | SUBCUTANEOUS | Status: DC
  Administered 2023-10-17: 5 [IU] via SUBCUTANEOUS
  Filled 2023-10-17 (×3): qty 0.05

## 2023-10-17 MED ORDER — TRAMADOL HCL 50 MG PO TABS
50.0000 mg | ORAL_TABLET | Freq: Four times a day (QID) | ORAL | Status: DC
  Administered 2023-10-17 – 2023-10-26 (×33): 50 mg via ORAL
  Filled 2023-10-17 (×33): qty 1

## 2023-10-17 MED ORDER — HYDROCODONE-ACETAMINOPHEN 5-325 MG PO TABS
1.0000 | ORAL_TABLET | Freq: Four times a day (QID) | ORAL | Status: DC | PRN
  Administered 2023-10-17 – 2023-10-18 (×2): 1 via ORAL
  Filled 2023-10-17 (×2): qty 1

## 2023-10-17 MED ORDER — INSULIN ASPART 100 UNIT/ML IJ SOLN
0.0000 [IU] | INTRAMUSCULAR | Status: DC
  Administered 2023-10-17: 6 [IU] via SUBCUTANEOUS

## 2023-10-17 MED ORDER — LEVETIRACETAM ER 500 MG PO TB24
1000.0000 mg | ORAL_TABLET | Freq: Every day | ORAL | Status: DC
  Administered 2023-10-17 – 2023-10-26 (×10): 1000 mg via ORAL
  Filled 2023-10-17 (×10): qty 2

## 2023-10-17 MED ORDER — INSULIN ASPART 100 UNIT/ML IJ SOLN: 0.0000 [IU] | INTRAMUSCULAR | Status: DC

## 2023-10-17 MED ORDER — FERROUS GLUCONATE 324 (38 FE) MG PO TABS
324.0000 mg | ORAL_TABLET | Freq: Every day | ORAL | Status: DC
  Administered 2023-10-18 – 2023-10-26 (×9): 324 mg via ORAL
  Filled 2023-10-17 (×9): qty 1

## 2023-10-17 MED ORDER — ASPIRIN 81 MG PO TBEC
81.0000 mg | DELAYED_RELEASE_TABLET | Freq: Every day | ORAL | Status: DC
  Administered 2023-10-17 – 2023-10-26 (×10): 81 mg via ORAL
  Filled 2023-10-17 (×11): qty 1

## 2023-10-17 MED ORDER — PREGABALIN 75 MG PO CAPS
150.0000 mg | ORAL_CAPSULE | Freq: Three times a day (TID) | ORAL | Status: DC
  Administered 2023-10-17 – 2023-10-26 (×26): 150 mg via ORAL
  Filled 2023-10-17 (×26): qty 2

## 2023-10-17 MED ORDER — RISPERIDONE 0.5 MG PO TABS
0.5000 mg | ORAL_TABLET | Freq: Every day | ORAL | Status: DC
  Administered 2023-10-17 – 2023-10-25 (×9): 0.5 mg via ORAL
  Filled 2023-10-17 (×10): qty 1

## 2023-10-17 MED ORDER — CLOPIDOGREL BISULFATE 75 MG PO TABS
75.0000 mg | ORAL_TABLET | Freq: Every day | ORAL | Status: DC
  Administered 2023-10-17 – 2023-10-20 (×4): 75 mg via ORAL
  Filled 2023-10-17 (×5): qty 1

## 2023-10-17 MED ORDER — ATORVASTATIN CALCIUM 80 MG PO TABS
80.0000 mg | ORAL_TABLET | Freq: Every day | ORAL | Status: DC
  Administered 2023-10-17 – 2023-10-26 (×10): 80 mg via ORAL
  Filled 2023-10-17 (×10): qty 1

## 2023-10-17 MED ORDER — PANTOPRAZOLE SODIUM 40 MG PO TBEC
40.0000 mg | DELAYED_RELEASE_TABLET | Freq: Every day | ORAL | Status: DC
  Administered 2023-10-17 – 2023-10-26 (×10): 40 mg via ORAL
  Filled 2023-10-17 (×10): qty 1

## 2023-10-17 MED ORDER — NITROGLYCERIN 0.4 MG SL SUBL: 0.4000 mg | SUBLINGUAL_TABLET | SUBLINGUAL | Status: DC | PRN

## 2023-10-17 MED ORDER — DIPHENHYDRAMINE HCL 25 MG PO CAPS: 50.0000 mg | ORAL_CAPSULE | Freq: Once | ORAL | Status: DC

## 2023-10-17 MED ORDER — INSULIN ASPART 100 UNIT/ML IJ SOLN
0.0000 [IU] | Freq: Every day | INTRAMUSCULAR | Status: DC
  Administered 2023-10-17: 5 [IU] via SUBCUTANEOUS
  Administered 2023-10-18: 3 [IU] via SUBCUTANEOUS
  Administered 2023-10-19: 5 [IU] via SUBCUTANEOUS
  Administered 2023-10-21 – 2023-10-22 (×2): 2 [IU] via SUBCUTANEOUS
  Administered 2023-10-24: 5 [IU] via SUBCUTANEOUS
  Administered 2023-10-25: 2 [IU] via SUBCUTANEOUS

## 2023-10-17 MED ORDER — TRAZODONE HCL 50 MG PO TABS
200.0000 mg | ORAL_TABLET | Freq: Every day | ORAL | Status: DC
  Administered 2023-10-17 – 2023-10-25 (×9): 200 mg via ORAL
  Filled 2023-10-17 (×9): qty 4

## 2023-10-17 MED ORDER — ONDANSETRON HCL 4 MG/2ML IJ SOLN: 4.0000 mg | Freq: Four times a day (QID) | INTRAMUSCULAR | Status: DC | PRN

## 2023-10-17 MED ORDER — LISINOPRIL 20 MG PO TABS: 40.0000 mg | ORAL_TABLET | Freq: Every day | ORAL | Status: DC

## 2023-10-17 MED ORDER — DIAZEPAM 5 MG PO TABS
5.0000 mg | ORAL_TABLET | Freq: Two times a day (BID) | ORAL | Status: DC
  Administered 2023-10-17 – 2023-10-26 (×17): 5 mg via ORAL
  Filled 2023-10-17 (×17): qty 1

## 2023-10-17 MED ORDER — METHOCARBAMOL 500 MG PO TABS
500.0000 mg | ORAL_TABLET | Freq: Two times a day (BID) | ORAL | Status: DC
  Administered 2023-10-17 – 2023-10-25 (×17): 500 mg via ORAL
  Filled 2023-10-17 (×17): qty 1

## 2023-10-17 MED ORDER — ACETAMINOPHEN 325 MG PO TABS
650.0000 mg | ORAL_TABLET | ORAL | Status: DC | PRN
  Administered 2023-10-19 (×2): 650 mg via ORAL
  Filled 2023-10-17 (×2): qty 2

## 2023-10-17 MED ORDER — PREDNISONE 20 MG PO TABS: 50.0000 mg | ORAL_TABLET | Freq: Four times a day (QID) | ORAL | Status: DC

## 2023-10-17 MED ORDER — HEPARIN SODIUM (PORCINE) 5000 UNIT/ML IJ SOLN
5000.0000 [IU] | Freq: Three times a day (TID) | INTRAMUSCULAR | Status: DC
  Administered 2023-10-17 – 2023-10-20 (×9): 5000 [IU] via SUBCUTANEOUS
  Filled 2023-10-17 (×9): qty 1

## 2023-10-17 NOTE — Assessment & Plan Note (Addendum)
 Patient is currently hypoxic with O2 sats of 95% on 2 L nasal cannula. At [facility] ABG showed hypoxia without acidosis:    Differentials include  : -PNA- cont iv abx and supplemental oxygen / IS/ PRN albuterol/ procalcitonin and swallow eval. Prednisone d/c. -CHF-BNP is elevated will defer to cards for diuresis.strict I/O. Chest xray at [facility] showed increased interstitial markings consistent with CHF. IV Lasix was given.   -H/O tobacco abuse/ ? COPD : PRN albuterol. - GERD variant asthma aspiration precaution and PRN albuterol MDI.

## 2023-10-17 NOTE — Assessment & Plan Note (Addendum)
 We will cont pt on asa 81/ statin / labetalol  once med rec done the patient's BP ai 122 systolic.  We will resume if room.

## 2023-10-17 NOTE — H&P (Addendum)
 Cardiology Admission History and Physical   Patient ID: [name] MRN: [id number]; DOB: 1980/11/13   Admission date: 10/17/2023  PCP:  Theoplis Fix, MD    [facility]Care Providers Cardiologist:  [name], MD   {  Chief Complaint:  chest pain  Patient Profile:   Taylor [name] is a 43 y.o. 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  Transferred from [facility]  History of Present Illness:   Taylor [name] has cardiac history significant for mildly reduced EF 45 to 50% noted on echocardiogram in April 2025.  Started on Jardiance, spironolactone, beta-blockers.  Had palpitations so ZIO monitor was ordered.  I do not see this was ever completed.  Does not seem like Taylor [name] followed up with cardio. Taylor [name] has been on Plavix  for history of CVAs.  No documented history of atrial fibrillation.  Currently patient has been transferred from [facility] for complaints of chest pain. Also has had hyperglycemia with glucose as high as 600 but in the setting of Solu-Medrol  use, given IV insulin .  Also with pneumonia with CTA findings indicating diffuse infiltrates throughout the left lung, atypical infection versus pneumonitis.  Started on azithromycin and ceftriaxone.  X-ray with increased interstitial markings felt to be component of CHF so IV Lasix 40 mg given.  Additionally has had severe anxiety requiring IV Ativan .  Patient reports that Taylor [name] has had some memory loss since Taylor [name] strokes and forgets some details however reports that for the last 2 to 3 days ago Taylor [name] had a panic attack when Taylor [name] woke up and started becoming short of breath and started having sharp stabbing chest pain, felt like somebody was  grabbing Taylor [name] chest.  Reported radiating pain down Taylor [name] left arm.  Generally has not ever had any chest complaints.  Now has intermittent complaints.  No exertional chest pain.  Currently with very mild chest pain.  Somewhat reproducible to palpation.  Reports mild edema in Taylor [name] ankles occasionally but no significant shortness of breath or orthopnea.  During our conversation I continued to ask further questions regarding Taylor [name] complaints and Taylor [name] suddenly became very tearful and emotional.  Reports that Taylor [name] is disabled and Taylor [name] mother and father look after Taylor [name] and take care of Taylor [name] medical issues.  Troponins [id number].  Negative UDS.  WBC 14.6.  Hemoglobin 8.9.  Hematocrit 30.  Elevated neutrophil count.  Negative pregnancy test.  Lipase 91.  proBNP 336.  Potassium at 1 point was 6.1 but most recent shows 4.5.  Creatinine 1.49.  CTA negative for PE.  Diffuse infiltrates noted throughout the left lung, atypical infection versus pneumonitis.    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   due to elevated blood sugar 1700-off insulin  6 months   Depression    Diabetes mellitus    off insulin  6 months   Diverticulitis    Dyspnea    when walking   GERD (gastroesophageal reflux disease)    Hypertension    Kidney dialysis status    on dialysis(stroke) off after 4-6 weeks   Pneumonia 2013, 06/2018   Respiratory failure (HCC)  04/2012   with stroke - in hosp [facility] 12 weeks   Seizures (HCC)    during elvated blood sugsr episode 11/13   Sepsis (HCC)    Stroke (HCC)    11/2018, 12/2018 Weak left side, speech- slurred, Short term memeroy, Gait unsteady    Vocal cord dysfunction     Past Surgical History:  Procedure Laterality Date   ABDOMINAL AORTOGRAM W/LOWER EXTREMITY N/A 06/25/2019   Procedure: ABDOMINAL AORTOGRAM W/LOWER EXTREMITY;  Surgeon: Margherita Shell,  MD;  [facility]: [facility];  Service: Cardiovascular;  Laterality: N/A;   ABDOMINAL AORTOGRAM W/LOWER EXTREMITY N/A 10/22/2019   Procedure: ABDOMINAL AORTOGRAM W/LOWER EXTREMITY;  Surgeon: Margherita Shell, MD;  Location: [facility];  Service: Cardiovascular;  Laterality: N/A;   ABDOMINAL AORTOGRAM W/LOWER EXTREMITY Left 04/01/2020   Procedure: ABDOMINAL AORTOGRAM W/LOWER EXTREMITY;  Surgeon: Young Hensen, MD;  Location: [facility];  Service: Cardiovascular;  Laterality: Left;   ABDOMINAL AORTOGRAM W/LOWER EXTREMITY N/A 02/01/2022   Procedure: ABDOMINAL AORTOGRAM W/LOWER EXTREMITY;  Surgeon: Margherita Shell, MD;  [facility]: [facility];  Service: Cardiovascular;  Laterality: N/A;   AMPUTATION Left 07/03/2019   Procedure: LEFT FOOT 4TH AND 5TH RAY AMPUTATION;  Surgeon: Timothy Ford, MD;  Location: [facility] OR;  Service: Orthopedics;  Laterality: Left;   APPENDECTOMY     FEMORAL-POPLITEAL BYPASS GRAFT Left 02/04/2022   Procedure: LEFT FEMORAL-POPLITEAL ARTERY BYPASS;  Surgeon: Margherita Shell, MD;  Location: [facility];  Service: Vascular;  Laterality: Left;   INSERTION OF DIALYSIS CATHETER  11/13   removed in 4-6 weeks   IR ANGIO INTRA EXTRACRAN SEL COM CAROTID INNOMINATE BILAT MOD SED  02/07/2019   IR ANGIO VERTEBRAL SEL VERTEBRAL BILAT MOD SED  02/07/2019   IR ANGIOGRAM EXTREMITY LEFT  02/07/2019   IR ANGIOGRAM EXTREMITY RIGHT  02/20/2019   IR TRANSCATH EXCRAN VERT OR CAR A STENT  02/20/2019   IR TRANSCATH EXCRAN VERT OR CAR A STENT  03/25/2019   IR US  GUIDE VASC ACCESS RIGHT  02/07/2019   MULTIPLE EXTRACTIONS WITH ALVEOLOPLASTY N/A 02/24/2014   Procedure: MULTIPLE EXTRACTION WITH ALVEOLOPLASTY AND BIOPSY;  Surgeon: Cornelia Dieter, DDS;  Location: [facility];  Service: Oral Surgery;  Laterality: N/A;   PERIPHERAL VASCULAR ATHERECTOMY Left 06/25/2019   Procedure: PERIPHERAL VASCULAR ATHERECTOMY;  Surgeon: Margherita Shell, MD;  [facility];  Service:  Cardiovascular;  Laterality: Left;  superficial femoral   PERIPHERAL VASCULAR BALLOON ANGIOPLASTY Left 10/22/2019   Procedure: PERIPHERAL VASCULAR BALLOON ANGIOPLASTY;  Surgeon: Margherita Shell, MD;  [facility];  Service: Cardiovascular;  Laterality: Left;  superficial femoral   PERIPHERAL VASCULAR INTERVENTION  04/01/2020   Procedure: PERIPHERAL VASCULAR INTERVENTION;  Surgeon: Young Hensen, MD;  [facility]: [facility];  Service: Cardiovascular;;  left SFA stent    RADIOLOGY WITH ANESTHESIA N/A 02/20/2019   Procedure: RADIOLOGY WITH ANESTHESIA   STENTING;  Surgeon: Luellen Sages, MD;  Location: [facility];  Service: Radiology;  Laterality: N/A;   RADIOLOGY WITH ANESTHESIA N/A 03/25/2019   Procedure: RADIOLOGY WITH ANESTHESIA STENTING;  Surgeon: Luellen Sages, MD;  Location: [facility];  Service: Radiology;  Laterality: N/A;   SKIN SPLIT GRAFT Left 07/03/2019   Procedure: APPLY SKIN GRAFT LEFT FOOT;  Surgeon: Timothy Ford, MD;  Location: [facility] OR;  Service: Orthopedics;  Laterality: Left;   TRACHEOSTOMY  11/13   closed 1/14   TRANSRECTAL DRAINAGE OF PELVIC ABSCESS  12/11   VASCULAR SURGERY  04/01/2020   two stents placed in left leg     Medications Prior to Admission: Prior to Admission medications   Medication Sig Start Date End Date Taking? Authorizing Provider  aspirin  81 MG EC tablet Take 81 mg by mouth daily.     [provider]  atorvastatin  (LIPITOR ) 80 MG tablet TAKE 1 TABLET BY MOUTH DAILY 01/10/23   Adine Hoof, MD  blood glucose meter kit and supplies Dispense based on patient and insurance preference. Use up to four times daily as directed. (FOR ICD-10 E10.9, E11.9). 07/05/19   Arrien, Mauricio Daniel, MD  clopidogrel  (PLAVIX ) 75 MG tablet Take 1 tablet (75 mg total) by mouth daily. 05/08/23   Patel, Donika K, DO  dapagliflozin  propanediol (FARXIGA ) 10 MG TABS tablet Take 10 mg by mouth daily.     [provider]   diazepam  (VALIUM ) 5 MG tablet Take 5 mg by mouth 2 (two) times daily.     [provider]  ferrous gluconate  (FERGON) 324 MG tablet Take 324 mg by mouth daily.    [provider]  FLUoxetine  (PROZAC ) 10 MG capsule Take 10 mg by mouth daily.  11/27/19   [provider]  HYDROcodone -acetaminophen  (NORCO) 5-325 MG tablet Take 1 tablet by mouth every 6 (six) hours as needed for severe pain. 03/21/22   Baglia, Corrina, PA-C  HYDROcodone -acetaminophen  (NORCO/VICODIN) 5-325 MG tablet Take 1 tablet by mouth every 6 (six) hours as needed for severe pain. 03/07/22   Baglia, Corrina, PA-C  labetalol  (NORMODYNE ) 300 MG tablet Take 300 mg by mouth 2 (two) times daily.    [provider]  levETIRAcetam  (KEPPRA  XR) 500 MG 24 hr tablet Take 2 tablets (1,000 mg total) by mouth daily. 05/08/23   Patel, Donika K, DO  lisinopril  (ZESTRIL ) 40 MG tablet Take 40 mg by mouth daily.     [provider]  metFORMIN  (GLUCOPHAGE ) 500 MG tablet Take 500 mg by mouth 3 (three) times daily. 11/16/18   [provider]  methocarbamol  (ROBAXIN ) 500 MG tablet Take 500 mg by mouth 2 (two) times daily. 01/12/22   [provider]  omeprazole (PRILOSEC) 20 MG capsule Take 20 mg by mouth daily.    [provider]  pregabalin  (LYRICA ) 150 MG capsule TAKE 1 CAPSULE BY MOUTH THREE TIMES DAILY 10/09/23   Patel, Donika K, DO  risperiDONE  (RISPERDAL ) 0.5 MG tablet Take 0.5 mg by mouth at bedtime.    [provider]  tiZANidine  (ZANAFLEX ) 2 MG tablet TAKE 1 TABLET BY MOUTH AT BEDTIME AS NEEDED FOR MUSCLE SPASMS 07/15/21   Patel, Donika K, DO  traMADol  (ULTRAM ) 50 MG tablet Take 50 mg by mouth 4 (four) times daily. 01/13/21   [provider]  traZODone  (DESYREL ) 100 MG tablet Take 200 mg by mouth at bedtime.     [provider]     Allergies:    Allergies  Allergen Reactions   Contrast Media [Iodinated Contrast Media] Other (See Comments)    2013-had CVA,  went into AFR-combination ABT, contrast, low BP   Amitriptyline Other (See Comments)    Unresponsive    Compazine Other (See Comments)    Alert but unable to move   Morphine And Codeine Other (See Comments)    Chest pain   Oxycodone  Nausea And Vomiting    Severe n/v -  patient will not take oxycodone  or percocet.   Prochlorperazine Maleate    Promethazine  Swelling  Lips swell   Adhesive [Tape] Rash    Long term exposure causes skin to tear    Sumatriptan Rash    Social History:   Social History   Socioeconomic History   Marital status: Single    Spouse name: Not on file   Number of children: 0   Years of education: 14   Highest education level: Not on file  Occupational History   Not on file  Tobacco Use   Smoking status: Former    Current packs/day: 0.00    Average packs/day: 0.6 packs/day for 23.0 years (13.8 ttl pk-yrs)    Types: Cigarettes    Start date: 02/05/1996    Quit date: 02/05/2019    Years since quitting: 4.6    Passive exposure: Never   Smokeless tobacco: Never  Vaping Use   Vaping status: Never Used  Substance and Sexual Activity   Alcohol use: No    Alcohol/week: 0.0 standard drinks of alcohol   Drug use: No   Sexual activity: Not Currently    Birth control/protection: None  Other Topics Concern   Not on file  Social History Narrative   Taylor [name] is single and does not have any children.    Taylor [name] has 2 yrs of college level education.   Taylor [name] has 5-6 caffeine drinks daily.    Left handed      Living with parents.   Social Drivers of Corporate investment banker Strain: Low Risk  (06/30/2023)   Received from [facility]   Overall Financial Resource Strain (CARDIA)    Difficulty of Paying Living Expenses: Not hard at all  Food Insecurity: No Food Insecurity (06/30/2023)   Received from [facility]   Hunger Vital Sign    Worried About Running Out of Food in the Last Year: Never true    Ran Out of Food in the Last Year: Never true   Transportation Needs: No Transportation Needs (06/30/2023)   Received from [facility] - Transportation    Lack of Transportation (Medical): No    Lack of Transportation (Non-Medical): No  Physical Activity: Insufficiently Active (06/30/2023)   Received from [facility]   Exercise Vital Sign    Days of Exercise per Week: 2 days    Minutes of Exercise per Session: 30 min  Stress: No Stress Concern Present (06/30/2023)   Received from [facility] - Occupational Stress Questionnaire    Feeling of Stress : Not at all  Social Connections: Unknown (10/13/2021)   Received from [facility], [facility]   Social Network    Social Network: Not on file  Intimate Partner Violence: Unknown (09/16/2021)   Received from [facility], [facility]   HITS    Physically Hurt: Not on file    Insult or Talk Down To: Not on file    Threaten Physical Harm: Not on file    Scream or Curse: Not on file    Family History:  The patient's family history includes Diabetes in Taylor [name] paternal grandfather; Heart attack in Taylor [name] maternal grandfather; Lung cancer in Taylor [name] maternal grandmother.    ROS:  Please see the history of present illness. All other ROS reviewed and negative.     Physical Exam/Data:   Vitals:   10/17/23 1457 10/17/23 1500  BP: 129/79   Pulse: (!) 104   Resp: 17   Temp: 98.9 F (37.2 C)   TempSrc: Oral  SpO2: 95%   Weight: 95 kg 96.4 kg  Height: 5\' 5"  (1.651 m)    No intake or output data in the 24 hours ending 10/17/23 1602    10/17/2023    3:00 PM 10/17/2023    2:57 PM 10/03/2022   11:24 AM  Last 3 Weights  Weight (lbs) 212 lb 9.6 oz 209 lb 7 oz 202 lb  Weight (kg) 96.435 kg 95 kg 91.627 kg     Body mass index is 35.38 kg/m.  General:  Well nourished, well developed, in no acute distress HEENT: normal Neck: no JVD Vascular: No carotid bruits; Distal pulses 2+ bilaterally   Cardiac:  normal S1, S2; RRR; no  murmur Lungs:  clear to auscultation bilaterally, no wheezing, rhonchi or rales  Abd: soft, nontender, no hepatomegaly  Ext: no edema Musculoskeletal:  No deformities, BUE and BLE strength normal and equal Skin: warm and dry  Neuro:  CNs 2-12 intact, no focal abnormalities noted Psych:  Normal affect    EKG: Sinus rhythm heart rate 93.  No acute ST-T wave changes.  Relevant CV Studies: Echocardiogram 09/27/2023  1. The left ventricle is mildly dilated in size with normal wall thickness.    2. The left ventricular systolic function is mildly decreased, LVEF is  visually estimated at 45-50%.    3. There is mild mitral valve regurgitation.    4. The right ventricle is normal in size, with normal systolic function.    Laboratory Data:  High Sensitivity Troponin:  No results for input(s): "TROPONINIHS" in the last 720 hours.    ChemistryNo results for input(s): "NA", "K", "CL", "CO2", "GLUCOSE", "BUN", "CREATININE", "CALCIUM ", "MG", "GFRNONAA", "GFRAA", "ANIONGAP" in the last 168 hours.  No results for input(s): "PROT", "ALBUMIN", "AST", "ALT", "ALKPHOS", "BILITOT" in the last 168 hours. Lipids No results for input(s): "CHOL", "TRIG", "HDL", "LABVLDL", "LDLCALC", "CHOLHDL" in the last 168 hours. HematologyNo results for input(s): "WBC", "RBC", "HGB", "HCT", "MCV", "MCH", "MCHC", "RDW", "PLT" in the last 168 hours. Thyroid No results for input(s): "TSH", "FREET4" in the last 168 hours. BNPNo results for input(s): "BNP", "PROBNP" in the last 168 hours.  DDimer No results for input(s): "DDIMER" in the last 168 hours.   Radiology/Studies:  No results found.   Assessment and Plan:   Chest pain Reporting intermittent nonexertional chest pain with radiation to Taylor [name] left arm associated with panic attack and shortness of breath.  EKG with no acute ST-T wave changes, Taylor [name] has mild elevation of troponins with no real trend.  They have fluctuated between 30 to as high as 80. Symptoms sound  atypical, I think proceeding with coronary CT is likely most appropriate. Will put in contrast allergy protocol.  Taylor [name]'s anxious here and tachy, but maybe by tomorrow HR will be better controlled and able to proceed. Get EKG for our documentation. Will make Taylor [name] n.p.o. for tomorrow's procedure, will message diabetic coordinator to adjust insulin  accordingly.   Heart failure with mildly reduced EF Previous echocardiogram April 2025 reporting EF 45 to 50%.  Today Taylor [name] looks euvolemic without any significant complaints. GDMT: Farxiga  10 mg, lisinopril  40 mg On labetalol  300mg  BID, will discuss with pharmacy equivalent dosing for coreg or toprol  XL.  Hyperlipidemia LDL is 20 on most recent lipid panel  On atorvastatin  80 mg ________________________________________  Will consult hospitalist for the following issues:  Diabetes BGL as high as 600 but also has gotten Solu-Medrol  recently.  Glucose here on arrival in the 400s.  Discussed with diabetic coordinator  who will also help manage insulin  while Taylor [name] is inpatient. A1c 7.8% in April, will repeat here.  Pneumonia Diffuse infiltrates noted throughout left lung on CT.   Started on ceftriaxone and azithromycin  History of CVAs Memory loss Left thalamic infarct 12/31/2018 and left paramedial pontine stroke 12/01/2018 due to intracranial stenosis Chronically on aspirin  81 mg, Plavix  75 mg  PAD Status post left femoral popliteal artery bypass 02/04/2022 with Dr. Charlotte [name]. Repeat arterial duplex 10/03/2022 with patent graft.  No claudication symptoms. On DAPT.  Anxiety Pain management/chronic back pain Seizure disorder Anemia   Risk Assessment/Risk Scores:   [place]  Heart Association (NYHA) Functional Class NYHA Class II    Code Status: Full Code  Severity of Illness: The appropriate patient status for this patient is OBSERVATION. Observation status is judged to be reasonable and necessary in order to provide the required intensity of  service to ensure the patient's safety. The patient's presenting symptoms, physical exam findings, and initial radiographic and laboratory data in the context of their medical condition is felt to place them at decreased risk for further clinical deterioration. Furthermore, it is anticipated that the patient will be medically stable for discharge from the hospital within 2 midnights of admission.    For questions or updates, please contact [facility] Please consult [email or web address] for contact info under     Signed, Burnetta Cart, PA-C  10/17/2023 4:02 PM    History and all data above reviewed.  Patient examined.  I agree with the findings as above.  Patient presented with chest discomfort.  Says Taylor [name] has some short-term memory problems since Taylor [name] had a stroke so Taylor [name] is really not very good getting history.  Taylor [name] has chronic back pain which is really bothering Taylor [name] and Taylor [name] says it was worse than previous.  Taylor [name] has been having some intermittent chest discomfort but Taylor [name] thought this was more persistent.  In the emergency room Taylor [name] was not found to have any acute EKG changes.  Troponin was mildly elevated and repeated many times.  Taylor [name] has not had a history of coronary disease but Taylor [name] has a recent diagnosis of reduced ejection fraction and has not had evaluation of this.  I do see that Taylor [name] had coronary calcium  on CT.  Taylor [name] was to see cardiology.  Taylor [name] has a complicated medical history with poorly controlled diabetes.  Taylor [name] gets around at home but gets short of breath with activities.  Taylor [name] does do some activities at home but is disabled.  Taylor [name] parents take care of Taylor [name].  Taylor [name] has not been describing new PND or orthopnea.  Taylor [name] was not describing new palpitations, presyncope or syncope.  Taylor [name] did have infiltrates on chest CT consistent with pneumonia.  Blood sugars were not controlled.  The patient exam reveals COR: Regular rate and rhythm, no murmurs,  Lungs: Decreased breath sounds without wheezing or crackles,  Abd:  Positive bowel sounds normal frequency pitch, bruits, rebound, guarding, Ext 2+ pulses.   All available labs, radiology testing, previous records reviewed. Agree with documented assessment and plan.  Chest pain: Chest pain is somewhat atypical but Taylor [name] does have significant cardiovascular risk factors.  The enzymes are not really diagnostic.  I am not strongly suspecting an acute coronary syndrome but will evaluate Taylor [name] with coronary CTA given reduced ejection fraction.  Taylor [name] is also been to be treated for probable pneumonia.  We are consulted the hospitalist to help us  with Taylor [name] diabetes and pneumonia management.  Taylor [name] will also need  continued pain management.  Taylor [name] hydrocodone  at home.  [name]  6:36 PM  10/17/2023

## 2023-10-17 NOTE — Consult Note (Signed)
 Consult Note   Patient: Taylor [name] JYN:[id number] DOB: September 02, 1980 DOA: 10/17/2023 DOS: the patient was seen and examined on 10/17/2023 PCP: Theoplis Fix, MD  Patient coming from: Home  Requesting physician: Burnetta Cart , PA-C / Dr. Lavonne Prairie  Reason for consultation: med management and DM II.   HPI:  Taylor [name] is a 43 y.o. female with past medical history  of diabetes mellitus type 2, essential hypertension, GERD,History of seizures history of stroke coming from [facility] for chest pains that started last night.  Chest is on the left side in the epigastric area.  Patient also reports shortness of breath.  Patient does not have a primary care states that Dr. Bernetta Brilliant is one of the providers who has recently changed her from gabapentin  to Lyrica .  Asking for pain meds for her low back pain.  At bedside she is alert awake oriented anxious appearing female no reports of chest pain currently. Inform patient that she is currently on antibiotics for pneumonia on the left side.  Review of Systems  Respiratory:  Positive for shortness of breath.   Cardiovascular:  Positive for chest pain.  Musculoskeletal:  Positive for back pain.   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   due to elevated blood sugar 1700-off insulin  6 months   Depression    Diabetes mellitus    off insulin  6 months   Diverticulitis    Dyspnea    when walking   GERD (gastroesophageal reflux disease)    Hypertension    Kidney dialysis status    on dialysis(stroke) off after 4-6 weeks   Pneumonia 2013, 06/2018   Respiratory failure (HCC) 04/2012   with stroke - in hosp [facility] 12 weeks   Seizures (HCC)    during elvated blood sugsr episode 11/13   Sepsis (HCC)    Stroke (HCC)    11/2018, 12/2018 Weak left side, speech- slurred, Short term memeroy, Gait unsteady    Vocal cord  dysfunction    Past Surgical History:  Procedure Laterality Date   ABDOMINAL AORTOGRAM W/LOWER EXTREMITY N/A 06/25/2019   Procedure: ABDOMINAL AORTOGRAM W/LOWER EXTREMITY;  Surgeon: Margherita Shell, MD;  [facility]: [facility];  Service: Cardiovascular;  Laterality: N/A;   ABDOMINAL AORTOGRAM W/LOWER EXTREMITY N/A 10/22/2019   Procedure: ABDOMINAL AORTOGRAM W/LOWER EXTREMITY;  Surgeon: Margherita Shell, MD;  Location: [facility];  Service: Cardiovascular;  Laterality: N/A;   ABDOMINAL AORTOGRAM W/LOWER EXTREMITY Left 04/01/2020   Procedure: ABDOMINAL AORTOGRAM W/LOWER EXTREMITY;  Surgeon: Young Hensen, MD;  [facility]: [facility];  Service: Cardiovascular;  Laterality: Left;   ABDOMINAL AORTOGRAM W/LOWER EXTREMITY N/A 02/01/2022   Procedure: ABDOMINAL AORTOGRAM W/LOWER EXTREMITY;  Surgeon: Margherita Shell, MD;  [facility]: [facility];  Service: Cardiovascular;  Laterality: N/A;   AMPUTATION Left 07/03/2019   Procedure: LEFT FOOT 4TH AND 5TH RAY AMPUTATION;  Surgeon: Timothy Ford, MD;  Location[facility] OR;  Service: Orthopedics;  Laterality: Left;   APPENDECTOMY     FEMORAL-POPLITEAL BYPASS GRAFT Left 02/04/2022   Procedure: LEFT FEMORAL-POPLITEAL ARTERY BYPASS;  Surgeon: Margherita Shell, MD;  Location: [facility];  Service: Vascular;  Laterality: Left;   INSERTION OF DIALYSIS CATHETER  11/13   removed in 4-6 weeks   IR ANGIO INTRA EXTRACRAN SEL COM CAROTID INNOMINATE  BILAT MOD SED  02/07/2019   IR ANGIO VERTEBRAL SEL VERTEBRAL BILAT MOD SED  02/07/2019   IR ANGIOGRAM EXTREMITY LEFT  02/07/2019   IR ANGIOGRAM EXTREMITY RIGHT  02/20/2019   IR TRANSCATH EXCRAN VERT OR CAR A STENT  02/20/2019   IR TRANSCATH EXCRAN VERT OR CAR A STENT  03/25/2019   IR US  GUIDE VASC ACCESS RIGHT  02/07/2019   MULTIPLE EXTRACTIONS WITH ALVEOLOPLASTY N/A 02/24/2014   Procedure: MULTIPLE EXTRACTION WITH ALVEOLOPLASTY AND BIOPSY;  Surgeon: Cornelia Dieter, DDS;  Location: [facility];  Service: Oral  Surgery;  Laterality: N/A;   PERIPHERAL VASCULAR ATHERECTOMY Left 06/25/2019   Procedure: PERIPHERAL VASCULAR ATHERECTOMY;  Surgeon: Margherita Shell, MD;  [facility];  Service: Cardiovascular;  Laterality: Left;  superficial femoral   PERIPHERAL VASCULAR BALLOON ANGIOPLASTY Left 10/22/2019   Procedure: PERIPHERAL VASCULAR BALLOON ANGIOPLASTY;  Surgeon: Margherita Shell, MD;  [facility];  Service: Cardiovascular;  Laterality: Left;  superficial femoral   PERIPHERAL VASCULAR INTERVENTION  04/01/2020   Procedure: PERIPHERAL VASCULAR INTERVENTION;  Surgeon: Young Hensen, MD;  [facility];  Service: Cardiovascular;;  left SFA stent    RADIOLOGY WITH ANESTHESIA N/A 02/20/2019   Procedure: RADIOLOGY WITH ANESTHESIA   STENTING;  Surgeon: Luellen Sages, MD;  Location: [facility];  Service: Radiology;  Laterality: N/A;   RADIOLOGY WITH ANESTHESIA N/A 03/25/2019   Procedure: RADIOLOGY WITH ANESTHESIA STENTING;  Surgeon: Luellen Sages, MD;  Location: [facility];  Service: Radiology;  Laterality: N/A;   SKIN SPLIT GRAFT Left 07/03/2019   Procedure: APPLY SKIN GRAFT LEFT FOOT;  Surgeon: Timothy Ford, MD;  Location: [facility] OR;  Service: Orthopedics;  Laterality: Left;   TRACHEOSTOMY  11/13   closed 1/14   TRANSRECTAL DRAINAGE OF PELVIC ABSCESS  12/11   VASCULAR SURGERY  04/01/2020   two stents placed in left leg    reports that she quit smoking about 4 years ago. Her smoking use included cigarettes. She started smoking about 27 years ago. She has a 13.8 pack-year smoking history. She has never been exposed to tobacco smoke. She has never used smokeless tobacco. She reports that she does not drink alcohol and does not use drugs. Allergies  Allergen Reactions   Amitriptyline Other (See Comments)    Unresponsive    Contrast Media [Iodinated Contrast Media] Other (See Comments)    2013-had CVA, went into AFR-combination ABT, contrast, low BP   Morphine  And Codeine Other (See Comments)    Chest pain   Promethazine  Swelling    Lips swell   Compazine Other (See Comments)    -Alert but unable to move   Prochlorperazine Maleate Other (See Comments)    -Alert, but unable to move   Adhesive [Tape] Rash    Long term exposure causes skin to tear    Oxycodone  Nausea And Vomiting    Severe n/v -  patient will not take oxycodone  or percocet.   Sumatriptan Rash   Family History  Problem Relation Age of Onset   Lung cancer Maternal Grandmother    Heart attack Maternal Grandfather    Diabetes Paternal Grandfather    Prior to Admission medications   Medication Sig Start Date End Date Taking? Authorizing Provider  aspirin  81 MG EC tablet Take 81 mg by mouth daily.     [provider]  atorvastatin  (LIPITOR ) 80 MG tablet TAKE 1 TABLET BY MOUTH DAILY 01/10/23   Adine Hoof, MD  blood glucose meter kit and supplies Dispense based on patient and insurance preference. Use up to four times daily as directed. (FOR ICD-10 E10.9, E11.9). 07/05/19   Arrien, Mauricio Daniel, MD  clopidogrel  (PLAVIX ) 75 MG tablet Take 1 tablet (75 mg total) by mouth daily. 05/08/23   Demetric Dunnaway, Donika K, DO  dapagliflozin  propanediol (FARXIGA ) 10 MG TABS tablet Take 10 mg by mouth daily.     [provider]  diazepam  (VALIUM ) 5 MG tablet Take 5 mg by mouth 2 (two) times daily.     [provider]  ferrous gluconate  (FERGON) 324 MG tablet Take 324 mg by mouth daily.    [provider]  FLUoxetine  (PROZAC ) 10 MG capsule Take 10 mg by mouth daily.  11/27/19   [provider]  HYDROcodone -acetaminophen  (NORCO) 5-325 MG tablet Take 1 tablet by mouth every 6 (six) hours as needed for severe pain. 03/21/22   Baglia, Corrina, PA-C  HYDROcodone -acetaminophen  (NORCO/VICODIN) 5-325 MG tablet Take 1 tablet by mouth every 6 (six) hours as needed for severe pain. 03/07/22   Baglia, Corrina, PA-C  labetalol  (NORMODYNE ) 300 MG tablet Take 300  mg by mouth 2 (two) times daily.    [provider]  levETIRAcetam  (KEPPRA  XR) 500 MG 24 hr tablet Take 2 tablets (1,000 mg total) by mouth daily. 05/08/23   Joud Pettinato, Donika K, DO  lisinopril  (ZESTRIL ) 40 MG tablet Take 40 mg by mouth daily.     [provider]  metFORMIN  (GLUCOPHAGE ) 500 MG tablet Take 500 mg by mouth 3 (three) times daily. 11/16/18   [provider]  methocarbamol  (ROBAXIN ) 500 MG tablet Take 500 mg by mouth 2 (two) times daily. 01/12/22   [provider]  omeprazole (PRILOSEC) 20 MG capsule Take 20 mg by mouth daily.    [provider]  pregabalin  (LYRICA ) 150 MG capsule TAKE 1 CAPSULE BY MOUTH THREE TIMES DAILY 10/09/23   Zuzu Befort, Donika K, DO  risperiDONE  (RISPERDAL ) 0.5 MG tablet Take 0.5 mg by mouth at bedtime.    [provider]  tiZANidine  (ZANAFLEX ) 2 MG tablet TAKE 1 TABLET BY MOUTH AT BEDTIME AS NEEDED FOR MUSCLE SPASMS 07/15/21   Kentarius Partington, Donika K, DO  traMADol  (ULTRAM ) 50 MG tablet Take 50 mg by mouth 4 (four) times daily. 01/13/21   [provider]  traZODone  (DESYREL ) 100 MG tablet Take 200 mg by mouth at bedtime.     [provider]                                                                                 Vitals:   10/17/23 1457 10/17/23 1500  BP: 129/79   Pulse: (!) 104   Resp: 17   Temp: 98.9 F (37.2 C)   TempSrc: Oral   SpO2: 95%   Weight: 95 kg 96.4 kg  Height: 5\' 5"  (1.651 m)    Physical Exam Vitals and nursing note reviewed.  Constitutional:      General: She is awake. She is not in acute distress.    Appearance: Normal appearance. She is obese.     Interventions: Nasal cannula in place.  HENT:     Head: Normocephalic and  atraumatic.     Right Ear: Hearing normal.     Left Ear: Hearing normal.     Nose: No nasal deformity.     Mouth/Throat:     Lips: Pink.  Eyes:     General: Lids are normal.     Extraocular Movements: Extraocular movements intact.  Cardiovascular:      Rate and Rhythm: Normal rate and regular rhythm.     Heart sounds: Normal heart sounds.  Pulmonary:     Effort: Pulmonary effort is normal.     Breath sounds: Normal breath sounds.  Abdominal:     General: Bowel sounds are normal. There is no distension.     Palpations: Abdomen is soft. There is no mass.     Tenderness: There is no abdominal tenderness.  Musculoskeletal:     Right lower leg: No edema.     Left lower leg: No edema.  Skin:    General: Skin is warm.  Neurological:     General: No focal deficit present.     Mental Status: She is alert and oriented to person, place, and time.     Cranial Nerves: Cranial nerves 2-12 are intact.  Psychiatric:        Mood and Affect: Mood is anxious.        Speech: Speech normal.        Behavior: Behavior is cooperative.      Results for orders placed or performed during the hospital encounter of 10/17/23 (from the past 48 hours)  Glucose, capillary     Status: Abnormal   Collection Time: 10/17/23  3:01 PM  Result Value Ref Range   Glucose-Capillary 453 (H) 70 - 99 mg/dL    Comment: Glucose reference range applies only to samples taken after fasting for at least 8 hours.  Hemoglobin A1c     Status: Abnormal   Collection Time: 10/17/23  3:48 PM  Result Value Ref Range   Hgb A1c MFr Bld 8.0 (H) 4.8 - 5.6 %    Comment: (NOTE) Pre diabetes:          5.7%-6.4%  Diabetes:              >6.4%  Glycemic control for   <7.0% adults with diabetes    Mean Plasma Glucose 182.9 mg/dL    Comment: Performed at [facility], [address], [place], [place] [address]  CBC     Status: Abnormal   Collection Time: 10/17/23  4:25 PM  Result Value Ref Range   WBC 19.5 (H) 4.0 - 10.5 K/uL   RBC 4.76 3.87 - 5.11 MIL/uL   Hemoglobin 9.6 (L) 12.0 - 15.0 g/dL   HCT 60.4 (L) 54.0 - 98.1 %   MCV 68.3 (L) 80.0 - 100.0 fL   MCH 20.2 (L) 26.0 - 34.0 pg   MCHC 29.5 (L) 30.0 - 36.0 g/dL   RDW 19.1 (H) 47.8 - 29.5 %   Platelets 262 150 - 400 K/uL    nRBC 0.0 0.0 - 0.2 %    Comment: Performed at [facility], [address] [place], [place] [address]    Data Reviewed: Relevant notes from primary care and specialist visits, past discharge summaries as available in EHR, including Care Everywhere. Prior diagnostic testing as pertinent to current admission diagnoses, Updated medications and problem lists for reconciliation ED course, including vitals, labs, imaging, treatment and response to treatment,Triage notes, nursing and pharmacy notes and ED provider's notes Notable results  as noted in HPI.Discussed case with EDMD/ ED APP/ or Specialty MD on call and as needed.  Assessment & Plan Chest pain  EKG shows : Sinus rhythm 93, PR interval 156, QTc C of 447, nonspecific ST-T wave abnormality. Last echo was 09/27/2023:1. The left ventricle is mildly dilated in size with normal wall thickness.    2. The left ventricular systolic function is mildly decreased, LVEF is  visually estimated at 45-50%.    3. There is mild mitral valve regurgitation.    4. The right ventricle is normal in size, with normal systolic function.  May benefit from sleep study.  Type II diabetes mellitus (HCC) Controlled :   Glycemic protocol with Sliding scale coverage. Hold metformin . Will hold farxiga  because of low BP and will hold home insulin  regimen.  Pt to resume home regimen at home.  Anemia Microcytic anemia with elevated RDW c/w IDA. Pt is on Eliquis and we will follow with stool occult if pos pt needs GI eval.   Depressive disorder Will continue patient's Prozac  and trazodone . PAD (peripheral artery disease) (HCC) We will cont pt on asa 81/ statin / labetalol  once med rec done the patient's BP ai 122 systolic.  We will resume if room.  At high risk for falls Fall precaution. PT consult prior to discharge.  Pneumonia CTA chest done for elevated dimer  at [facility] shows: Diffuse micronodular infiltrates throughout the left lower  lobe and to lesser extent within the right lung base, compatible with atypical infection versus pneumonitis.CBC shows wbc of 14.6 and normal differentials.  Patient started on Rocephin azithromycin.  Will obtain a procalcitonin and continue IV antibiotics.  Swallow study. Seizures (HCC) Seizures precaution. Aspiration precaution. Fall precaution.  Cont keppra .  Respiratory failure (HCC) Patient is currently hypoxic with O2 sats of 95% on 2 L nasal cannula. At [facility] ABG showed hypoxia without acidosis:    Differentials include  : -PNA- cont iv abx and supplemental oxygen / IS/ PRN albuterol/ procalcitonin and swallow eval. Prednisone d/c. -CHF-BNP is elevated will defer to cards for diuresis.strict I/O. Chest xray at [facility] showed increased interstitial markings consistent with CHF. IV Lasix was given.   -H/O tobacco abuse/ ? COPD : PRN albuterol. - GERD variant asthma aspiration precaution and PRN albuterol MDI.  AKI (acute kidney injury) (HCC)  Hold farxiga . Hold lisinopril .  Avoid nsaids. Avoid contrast. Renally dose meds.  Strict I/O.  Thank you for this consultation.  We will follow the patient with you.  Time Spent on Consult: 50 minutes.   Author: Lavanda Porter, MD 10/17/2023 5:19 PM

## 2023-10-17 NOTE — Assessment & Plan Note (Addendum)
  Hold farxiga . Hold lisinopril .  Avoid nsaids. Avoid contrast. Renally dose meds.  Strict I/O.

## 2023-10-17 NOTE — Assessment & Plan Note (Addendum)
 Fall precaution. PT consult prior to discharge.

## 2023-10-17 NOTE — Assessment & Plan Note (Addendum)
  EKG shows : Sinus rhythm 93, PR interval 156, QTc C of 447, nonspecific ST-T wave abnormality. Last echo was 09/27/2023:1. The left ventricle is mildly dilated in size with normal wall thickness.    2. The left ventricular systolic function is mildly decreased, LVEF is  visually estimated at 45-50%.    3. There is mild mitral valve regurgitation.    4. The right ventricle is normal in size, with normal systolic function.  May benefit from sleep study.

## 2023-10-17 NOTE — Plan of Care (Signed)
  Problem: Education: Goal: Knowledge of General Education information will improve Description: Including pain rating scale, medication(s)/side effects and non-pharmacologic comfort measures Outcome: Progressing   Problem: Health Behavior/Discharge Planning: Goal: Ability to manage health-related needs will improve Outcome: Progressing   Problem: Clinical Measurements: Goal: Ability to maintain clinical measurements within normal limits will improve Outcome: Progressing Goal: Will remain free from infection Outcome: Progressing Goal: Diagnostic test results will improve Outcome: Progressing Goal: Respiratory complications will improve Outcome: Progressing Goal: Cardiovascular complication will be avoided Outcome: Progressing   Problem: Activity: Goal: Risk for activity intolerance will decrease Outcome: Progressing   Problem: Nutrition: Goal: Adequate nutrition will be maintained Outcome: Progressing   Problem: Coping: Goal: Level of anxiety will decrease Outcome: Progressing   Problem: Elimination: Goal: Will not experience complications related to bowel motility Outcome: Progressing Goal: Will not experience complications related to urinary retention Outcome: Progressing   Problem: Pain Managment: Goal: General experience of comfort will improve and/or be controlled Outcome: Progressing   Problem: Safety: Goal: Ability to remain free from injury will improve Outcome: Progressing   Problem: Skin Integrity: Goal: Risk for impaired skin integrity will decrease Outcome: Progressing   Problem: Education: Goal: Ability to describe self-care measures that may prevent or decrease complications (Diabetes Survival Skills Education) will improve Outcome: Progressing Goal: Individualized Educational Video(s) Outcome: Progressing   Problem: Coping: Goal: Ability to adjust to condition or change in health will improve Outcome: Progressing   Problem: Fluid  Volume: Goal: Ability to maintain a balanced intake and output will improve Outcome: Progressing   Problem: Health Behavior/Discharge Planning: Goal: Ability to identify and utilize available resources and services will improve Outcome: Progressing Goal: Ability to manage health-related needs will improve Outcome: Progressing   Problem: Metabolic: Goal: Ability to maintain appropriate glucose levels will improve Outcome: Progressing   Problem: Nutritional: Goal: Maintenance of adequate nutrition will improve Outcome: Progressing Goal: Progress toward achieving an optimal weight will improve Outcome: Progressing   Problem: Skin Integrity: Goal: Risk for impaired skin integrity will decrease Outcome: Progressing   Problem: Tissue Perfusion: Goal: Adequacy of tissue perfusion will improve Outcome: Progressing   Problem: Education: Goal: Understanding of cardiac disease, CV risk reduction, and recovery process will improve Outcome: Progressing Goal: Individualized Educational Video(s) Outcome: Progressing   Problem: Activity: Goal: Ability to tolerate increased activity will improve Outcome: Progressing   Problem: Cardiac: Goal: Ability to achieve and maintain adequate cardiovascular perfusion will improve Outcome: Progressing   Problem: Health Behavior/Discharge Planning: Goal: Ability to safely manage health-related needs after discharge will improve Outcome: Progressing

## 2023-10-17 NOTE — Assessment & Plan Note (Addendum)
 Microcytic anemia with elevated RDW c/w IDA. Pt is on Eliquis and we will follow with stool occult if pos pt needs GI eval.

## 2023-10-17 NOTE — Assessment & Plan Note (Addendum)
 Will continue patient's Prozac  and trazodone .

## 2023-10-17 NOTE — Assessment & Plan Note (Addendum)
 Seizures precaution. Aspiration precaution. Fall precaution.  Cont keppra .

## 2023-10-17 NOTE — Assessment & Plan Note (Addendum)
 Controlled :   Glycemic protocol with Sliding scale coverage. Hold metformin . Will hold farxiga  because of low BP and will hold home insulin  regimen.  Pt to resume home regimen at home.

## 2023-10-17 NOTE — Inpatient Diabetes Management (Addendum)
 Inpatient Diabetes Program Recommendations  AACE/ADA: New Consensus Statement on Inpatient Glycemic Control (2015)  Target Ranges:  Prepandial:   less than 140 mg/dL      Peak postprandial:   less than 180 mg/dL (1-2 hours)      Critically ill patients:  140 - 180 mg/dL   Lab Results  Component Value Date   GLUCAP 453 (H) 10/17/2023   HGBA1C 6.9 (H) 02/04/2022    Review of Glycemic Control  Diabetes history: DM 2 Outpatient Diabetes medications: metformin  1000 mg Daily, Farxiga  10 mg Daily Current orders for Inpatient glycemic control:  Farxiga  10 mg Daily Transfer  Last A1c on file was 6.9 from 8/25  Pt received IV Solumedrol 40 mg at 0130 am 5/6 10 units IV insulin  administered 1130 this am 5/6  Glucose on arrival 453  Inpatient Diabetes Program Recommendations:    -   Semglee 10 units x1 dose now -   Novolog  0-6 units Q4 hours (elevated renal function creat 1.49)  May need additional Novolog  for meal coverage due to solumedrol. No plans to give additional steroids at the moment waiting for Hospitalist evaluation.  Spoke with admitting provider regarding pt plan of care.  Will follow while inpatient  Addendum: Pt to receive PO prednisone 50 mg x3 doses overnight for contrast allergy. Increasing Novolog  sliding scale to 0-15 units Q4 hours. Follow first thing in the am.  Thanks,  Eloise Hake RN, MSN, BC-ADM Inpatient Diabetes Coordinator Team Pager [phone] (8a-5p)

## 2023-10-17 NOTE — Assessment & Plan Note (Addendum)
 CTA chest done for elevated dimer  at [facility] shows: Diffuse micronodular infiltrates throughout the left lower lobe and to lesser extent within the right lung base, compatible with atypical infection versus pneumonitis.CBC shows wbc of 14.6 and normal differentials.  Patient started on Rocephin azithromycin.  Will obtain a procalcitonin and continue IV antibiotics.  Swallow study.

## 2023-10-18 DIAGNOSIS — F319 Bipolar disorder, unspecified: Secondary | ICD-10-CM | POA: Diagnosis present

## 2023-10-18 DIAGNOSIS — J181 Lobar pneumonia, unspecified organism: Secondary | ICD-10-CM | POA: Diagnosis present

## 2023-10-18 DIAGNOSIS — I259 Chronic ischemic heart disease, unspecified: Secondary | ICD-10-CM | POA: Diagnosis not present

## 2023-10-18 DIAGNOSIS — E1100 Type 2 diabetes mellitus with hyperosmolarity without nonketotic hyperglycemic-hyperosmolar coma (NKHHC): Secondary | ICD-10-CM | POA: Diagnosis not present

## 2023-10-18 DIAGNOSIS — E785 Hyperlipidemia, unspecified: Secondary | ICD-10-CM | POA: Diagnosis present

## 2023-10-18 DIAGNOSIS — I739 Peripheral vascular disease, unspecified: Secondary | ICD-10-CM | POA: Diagnosis not present

## 2023-10-18 DIAGNOSIS — R072 Precordial pain: Secondary | ICD-10-CM | POA: Diagnosis not present

## 2023-10-18 DIAGNOSIS — N179 Acute kidney failure, unspecified: Secondary | ICD-10-CM | POA: Diagnosis present

## 2023-10-18 DIAGNOSIS — J440 Chronic obstructive pulmonary disease with acute lower respiratory infection: Secondary | ICD-10-CM | POA: Diagnosis present

## 2023-10-18 DIAGNOSIS — R931 Abnormal findings on diagnostic imaging of heart and coronary circulation: Secondary | ICD-10-CM | POA: Diagnosis not present

## 2023-10-18 DIAGNOSIS — I25119 Atherosclerotic heart disease of native coronary artery with unspecified angina pectoris: Secondary | ICD-10-CM | POA: Diagnosis not present

## 2023-10-18 DIAGNOSIS — E875 Hyperkalemia: Secondary | ICD-10-CM | POA: Diagnosis present

## 2023-10-18 DIAGNOSIS — J189 Pneumonia, unspecified organism: Secondary | ICD-10-CM | POA: Diagnosis present

## 2023-10-18 DIAGNOSIS — Z7984 Long term (current) use of oral hypoglycemic drugs: Secondary | ICD-10-CM | POA: Diagnosis not present

## 2023-10-18 DIAGNOSIS — D509 Iron deficiency anemia, unspecified: Secondary | ICD-10-CM | POA: Diagnosis present

## 2023-10-18 DIAGNOSIS — E66811 Obesity, class 1: Secondary | ICD-10-CM | POA: Diagnosis present

## 2023-10-18 DIAGNOSIS — G890 Central pain syndrome: Secondary | ICD-10-CM | POA: Diagnosis present

## 2023-10-18 DIAGNOSIS — I110 Hypertensive heart disease with heart failure: Secondary | ICD-10-CM | POA: Diagnosis present

## 2023-10-18 DIAGNOSIS — I509 Heart failure, unspecified: Secondary | ICD-10-CM | POA: Diagnosis not present

## 2023-10-18 DIAGNOSIS — I5022 Chronic systolic (congestive) heart failure: Secondary | ICD-10-CM | POA: Diagnosis present

## 2023-10-18 DIAGNOSIS — I2510 Atherosclerotic heart disease of native coronary artery without angina pectoris: Secondary | ICD-10-CM | POA: Diagnosis not present

## 2023-10-18 DIAGNOSIS — I69398 Other sequelae of cerebral infarction: Secondary | ICD-10-CM | POA: Diagnosis not present

## 2023-10-18 DIAGNOSIS — E1165 Type 2 diabetes mellitus with hyperglycemia: Secondary | ICD-10-CM | POA: Diagnosis present

## 2023-10-18 DIAGNOSIS — G40909 Epilepsy, unspecified, not intractable, without status epilepticus: Secondary | ICD-10-CM | POA: Diagnosis present

## 2023-10-18 DIAGNOSIS — J9601 Acute respiratory failure with hypoxia: Secondary | ICD-10-CM | POA: Diagnosis present

## 2023-10-18 DIAGNOSIS — E1151 Type 2 diabetes mellitus with diabetic peripheral angiopathy without gangrene: Secondary | ICD-10-CM | POA: Diagnosis present

## 2023-10-18 DIAGNOSIS — Z9181 History of falling: Secondary | ICD-10-CM | POA: Diagnosis not present

## 2023-10-18 DIAGNOSIS — Z7901 Long term (current) use of anticoagulants: Secondary | ICD-10-CM | POA: Diagnosis not present

## 2023-10-18 DIAGNOSIS — Z79899 Other long term (current) drug therapy: Secondary | ICD-10-CM | POA: Diagnosis not present

## 2023-10-18 DIAGNOSIS — I200 Unstable angina: Secondary | ICD-10-CM | POA: Diagnosis not present

## 2023-10-18 DIAGNOSIS — I25110 Atherosclerotic heart disease of native coronary artery with unstable angina pectoris: Secondary | ICD-10-CM | POA: Diagnosis present

## 2023-10-18 DIAGNOSIS — Z794 Long term (current) use of insulin: Secondary | ICD-10-CM | POA: Diagnosis not present

## 2023-10-18 DIAGNOSIS — Z992 Dependence on renal dialysis: Secondary | ICD-10-CM | POA: Diagnosis not present

## 2023-10-18 DIAGNOSIS — I700 Atherosclerosis of aorta: Secondary | ICD-10-CM | POA: Diagnosis present

## 2023-10-18 DIAGNOSIS — I255 Ischemic cardiomyopathy: Secondary | ICD-10-CM | POA: Diagnosis present

## 2023-10-18 LAB — GLUCOSE, CAPILLARY
Glucose-Capillary: 252 mg/dL — ABNORMAL HIGH (ref 70–99)
Glucose-Capillary: 353 mg/dL — ABNORMAL HIGH (ref 70–99)
Glucose-Capillary: 218 mg/dL — ABNORMAL HIGH (ref 70–99)
Glucose-Capillary: 255 mg/dL — ABNORMAL HIGH (ref 70–99)

## 2023-10-18 LAB — CBC
HCT: 30.5 % — ABNORMAL LOW (ref 36.0–46.0)
Hemoglobin: 8.9 g/dL — ABNORMAL LOW (ref 12.0–15.0)
MCH: 20.1 pg — ABNORMAL LOW (ref 26.0–34.0)
MCHC: 29.2 g/dL — ABNORMAL LOW (ref 30.0–36.0)
MCV: 68.8 fL — ABNORMAL LOW (ref 80.0–100.0)
Platelets: 246 10*3/uL (ref 150–400)
RBC: 4.43 MIL/uL (ref 3.87–5.11)
RDW: 18.4 % — ABNORMAL HIGH (ref 11.5–15.5)
WBC: 16.1 10*3/uL — ABNORMAL HIGH (ref 4.0–10.5)
nRBC: 0 % (ref 0.0–0.2)

## 2023-10-18 LAB — BASIC METABOLIC PANEL WITH GFR
Anion gap: 11 (ref 5–15)
BUN: 27 mg/dL — ABNORMAL HIGH (ref 6–20)
CO2: 29 mmol/L (ref 22–32)
Calcium: 9.9 mg/dL (ref 8.9–10.3)
Chloride: 97 mmol/L — ABNORMAL LOW (ref 98–111)
Creatinine, Ser: 1.1 mg/dL — ABNORMAL HIGH (ref 0.44–1.00)
GFR, Estimated: >60 mL/min (ref >60)
Glucose, Bld: 248 mg/dL — ABNORMAL HIGH (ref 70–99)
Potassium: 4.8 mmol/L (ref 3.5–5.1)
Sodium: 137 mmol/L (ref 135–145)

## 2023-10-18 LAB — FOLATE: Folate: 9.2 ng/mL (ref >5.9)

## 2023-10-18 LAB — VITAMIN B12: Vitamin B-12: 643 pg/mL (ref 180–914)

## 2023-10-18 LAB — LIPID PANEL
Cholesterol: 136 mg/dL (ref 0–200)
HDL: 59 mg/dL (ref >40)
LDL Cholesterol: 47 mg/dL (ref 0–99)
Total CHOL/HDL Ratio: 2.3 ratio
Triglycerides: 150 mg/dL — ABNORMAL HIGH (ref <150)
VLDL: 30 mg/dL (ref 0–40)

## 2023-10-18 LAB — IRON AND TIBC
Iron: 24 ug/dL — ABNORMAL LOW (ref 28–170)
Saturation Ratios: 6 % — ABNORMAL LOW (ref 10.4–31.8)
TIBC: 427 ug/dL (ref 250–450)
UIBC: 403 ug/dL

## 2023-10-18 LAB — RETICULOCYTES
Immature Retic Fract: 22.4 % — ABNORMAL HIGH (ref 2.3–15.9)
RBC.: 4.38 MIL/uL (ref 3.87–5.11)
Retic Count, Absolute: 53 10*3/uL (ref 19.0–186.0)
Retic Ct Pct: 1.2 % (ref 0.4–3.1)

## 2023-10-18 LAB — FERRITIN: Ferritin: 25 ng/mL (ref 11–307)

## 2023-10-18 MED ORDER — DIPHENHYDRAMINE HCL 25 MG PO CAPS
50.0000 mg | ORAL_CAPSULE | Freq: Once | ORAL | Status: AC
Start: 2023-10-19 — End: 2023-10-19
  Administered 2023-10-19: 50 mg via ORAL
  Filled 2023-10-18: qty 2

## 2023-10-18 MED ORDER — INSULIN ASPART 100 UNIT/ML IJ SOLN: 4.0000 [IU] | Freq: Three times a day (TID) | INTRAMUSCULAR | Status: DC

## 2023-10-18 MED ORDER — MORPHINE SULFATE (PF) 2 MG/ML IV SOLN
1.0000 mg | INTRAVENOUS | Status: DC | PRN
  Administered 2023-10-20 – 2023-10-26 (×7): 1 mg via INTRAVENOUS
  Filled 2023-10-18 (×7): qty 1

## 2023-10-18 MED ORDER — MAGNESIUM OXIDE -MG SUPPLEMENT 400 (240 MG) MG PO TABS
400.0000 mg | ORAL_TABLET | Freq: Every day | ORAL | Status: DC
  Administered 2023-10-18 – 2023-10-26 (×9): 400 mg via ORAL
  Filled 2023-10-18 (×9): qty 1

## 2023-10-18 MED ORDER — PREDNISONE 20 MG PO TABS
50.0000 mg | ORAL_TABLET | Freq: Four times a day (QID) | ORAL | Status: AC
  Administered 2023-10-18 – 2023-10-19 (×3): 50 mg via ORAL
  Filled 2023-10-18 (×3): qty 1

## 2023-10-18 MED ORDER — PREDNISONE 10 MG PO TABS: 50.0000 mg | ORAL_TABLET | Freq: Four times a day (QID) | ORAL | Status: DC

## 2023-10-18 MED ORDER — SODIUM CHLORIDE 0.9 % IV SOLN: INTRAVENOUS | Status: DC

## 2023-10-18 MED ORDER — ALBUTEROL SULFATE (2.5 MG/3ML) 0.083% IN NEBU: 2.5000 mg | INHALATION_SOLUTION | RESPIRATORY_TRACT | Status: DC | PRN

## 2023-10-18 MED ORDER — DIPHENHYDRAMINE HCL 25 MG PO CAPS: 50.0000 mg | ORAL_CAPSULE | Freq: Once | ORAL | Status: DC

## 2023-10-18 MED ORDER — TIZANIDINE HCL 4 MG PO TABS
2.0000 mg | ORAL_TABLET | Freq: Two times a day (BID) | ORAL | Status: DC | PRN
  Administered 2023-10-19 – 2023-10-26 (×3): 2 mg via ORAL
  Filled 2023-10-18 (×4): qty 1

## 2023-10-18 MED ORDER — SODIUM CHLORIDE 0.9 % IV SOLN
1.0000 g | Freq: Every day | INTRAVENOUS | Status: DC
  Administered 2023-10-18 – 2023-10-21 (×4): 1 g via INTRAVENOUS
  Filled 2023-10-18 (×4): qty 10

## 2023-10-18 MED ORDER — SODIUM CHLORIDE 0.9 % IV SOLN
500.0000 mg | Freq: Every day | INTRAVENOUS | Status: DC
  Administered 2023-10-18: 500 mg via INTRAVENOUS
  Filled 2023-10-18 (×2): qty 5

## 2023-10-18 MED ORDER — INSULIN GLARGINE-YFGN 100 UNIT/ML ~~LOC~~ SOLN
10.0000 [IU] | Freq: Two times a day (BID) | SUBCUTANEOUS | Status: DC
  Administered 2023-10-18 – 2023-10-19 (×4): 10 [IU] via SUBCUTANEOUS
  Filled 2023-10-18 (×6): qty 0.1

## 2023-10-18 MED ORDER — METOPROLOL SUCCINATE ER 50 MG PO TB24
50.0000 mg | ORAL_TABLET | Freq: Every day | ORAL | Status: DC
  Administered 2023-10-18 – 2023-10-26 (×9): 50 mg via ORAL
  Filled 2023-10-18 (×9): qty 1

## 2023-10-18 MED ORDER — HYDROCODONE-ACETAMINOPHEN 5-325 MG PO TABS
1.0000 | ORAL_TABLET | ORAL | Status: DC | PRN
  Administered 2023-10-18 – 2023-10-19 (×4): 1 via ORAL
  Filled 2023-10-18 (×5): qty 1

## 2023-10-18 MED ORDER — SODIUM CHLORIDE 0.9 % IV BOLUS
500.0000 mL | Freq: Once | INTRAVENOUS | Status: AC
  Administered 2023-10-18: 500 mL via INTRAVENOUS

## 2023-10-18 NOTE — Inpatient Diabetes Management (Signed)
 Inpatient Diabetes Program Recommendations  AACE/ADA: New Consensus Statement on Inpatient Glycemic Control (2015)  Target Ranges:  Prepandial:   less than 140 mg/dL      Peak postprandial:   less than 180 mg/dL (1-2 hours)      Critically ill patients:  140 - 180 mg/dL   Lab Results  Component Value Date   GLUCAP 353 (H) 10/18/2023   HGBA1C 8.0 (H) 10/17/2023    Review of Glycemic Control  Latest Reference Range & Units 10/17/23 15:01 10/17/23 18:24 10/17/23 19:55 10/17/23 22:18 10/18/23 06:08 10/18/23 11:00  Glucose-Capillary 70 - 99 mg/dL 540 (H) 981 (H) 191 (H) 388 (H) 252 (H) 353 (H)  (H): Data is abnormally high  Diabetes history: DM 2 Outpatient Diabetes medications: metformin  1000 mg Daily, Farxiga  10 mg Daily Current orders for Inpatient glycemic control:  Semglee 10 units BID, Novolog  0-20 units TID and 0-5 units at bedtime, Prednisone 50 mg Q6H  Inpatient Diabetes Program Recommendations:    Might consider:  Novolog  3 units TID with meals if she consumes at least 50%.   Will continue to follow while inpatient.  Thank you, Hays Lipschutz, MSN, CDCES Diabetes Coordinator Inpatient Diabetes Program [phone] (team pager from 8a-5p)

## 2023-10-18 NOTE — Plan of Care (Signed)
  Problem: Education: Goal: Knowledge of General Education information will improve Description: Including pain rating scale, medication(s)/side effects and non-pharmacologic comfort measures Outcome: Progressing   Problem: Health Behavior/Discharge Planning: Goal: Ability to manage health-related needs will improve Outcome: Progressing   Problem: Clinical Measurements: Goal: Ability to maintain clinical measurements within normal limits will improve Outcome: Progressing Goal: Will remain free from infection Outcome: Progressing Goal: Diagnostic test results will improve Outcome: Progressing Goal: Respiratory complications will improve Outcome: Progressing Goal: Cardiovascular complication will be avoided Outcome: Progressing   Problem: Activity: Goal: Risk for activity intolerance will decrease Outcome: Progressing   Problem: Nutrition: Goal: Adequate nutrition will be maintained Outcome: Progressing   Problem: Coping: Goal: Level of anxiety will decrease Outcome: Progressing   Problem: Elimination: Goal: Will not experience complications related to bowel motility Outcome: Progressing Goal: Will not experience complications related to urinary retention Outcome: Progressing   Problem: Pain Managment: Goal: General experience of comfort will improve and/or be controlled Outcome: Progressing   Problem: Safety: Goal: Ability to remain free from injury will improve Outcome: Progressing   Problem: Skin Integrity: Goal: Risk for impaired skin integrity will decrease Outcome: Progressing   Problem: Education: Goal: Ability to describe self-care measures that may prevent or decrease complications (Diabetes Survival Skills Education) will improve Outcome: Progressing Goal: Individualized Educational Video(s) Outcome: Progressing   Problem: Coping: Goal: Ability to adjust to condition or change in health will improve Outcome: Progressing   Problem: Fluid  Volume: Goal: Ability to maintain a balanced intake and output will improve Outcome: Progressing   Problem: Health Behavior/Discharge Planning: Goal: Ability to identify and utilize available resources and services will improve Outcome: Progressing Goal: Ability to manage health-related needs will improve Outcome: Progressing   Problem: Metabolic: Goal: Ability to maintain appropriate glucose levels will improve Outcome: Progressing   Problem: Nutritional: Goal: Maintenance of adequate nutrition will improve Outcome: Progressing Goal: Progress toward achieving an optimal weight will improve Outcome: Progressing   Problem: Skin Integrity: Goal: Risk for impaired skin integrity will decrease Outcome: Progressing   Problem: Tissue Perfusion: Goal: Adequacy of tissue perfusion will improve Outcome: Progressing   Problem: Education: Goal: Understanding of cardiac disease, CV risk reduction, and recovery process will improve Outcome: Progressing Goal: Individualized Educational Video(s) Outcome: Progressing   Problem: Activity: Goal: Ability to tolerate increased activity will improve Outcome: Progressing   Problem: Cardiac: Goal: Ability to achieve and maintain adequate cardiovascular perfusion will improve Outcome: Progressing   Problem: Health Behavior/Discharge Planning: Goal: Ability to safely manage health-related needs after discharge will improve Outcome: Progressing

## 2023-10-18 NOTE — Progress Notes (Signed)
 [facility] CONSULT PROGRESS NOTE    Progress Note  Taylor [name]  ZOX:[id number] DOB: 1980-11-05 DOA: 10/17/2023 PCP: Theoplis Fix, MD   Brief Narrative:   Taylor [name] is an 43 y.o. female past medical history of diabetes mellitus type 2, essential hypertension, history of CVA with short-term memory problems, history of seizure and strokes comes in for [facility] for intermittent chest pain that started the day prior to admission   Assessment/Plan:   Acute respiratory failure probably with hypoxia 2/2 Pneumonia CTA done at [facility] for elevated D-dimer showed left lower lobe and right lung base infiltrate with a white count of 19. Continue IV Rocephin and azithromycin. She has remained afebrile leukocytosis is improving.  Atypical chest pain Her cardiac biomarkers from [facility] are basically been flat in the 80s. Twelve-lead EKG there is morning showing normal sinus rhythm nonspecific T wave changes no ST segment depression normal axis. Further management per cardiology they recommended a CTA On admission she was anxious and tachycardia which now has resolved. N.p.o. for procedure.  Acute kidney injury: Hold Farxiga  ACE inhibitor and NSAIDs, she was just given contrast. With a baseline creatinine of 0.8, and [place] it was 1.2. She was started on IV fluids. Creatinine is slowly trending down.  Chronic systolic heart failure: Echo in April 2025 showed an EF of 45%. Farxiga  and ACE inhibitor were held due to acute kidney injury. At home she was on labetalol  which was also held due to borderline blood pressure.  Type II diabetes mellitus (HCC) Last A1c of 7.8. She was started on long-acting insulin  plus sliding scale blood glucose continues to be elevated.  Microcytic anemia Patient is on Eliquis, hemoglobin has remained relatively stable at 8.9, MCV is low likely iron  deficient. Check an anemia panel. FOBT is pending.  Depressive  disorder Continue Prozac  and trazodone .  PAD (peripheral artery disease) (HCC) Continue aspirin , Plavix  statins and beta-blockers. Labetalol  was held due to borderline blood pressure.  Essential hypertension: ACE inhibitor and labetalol  were held due to borderline blood pressure. Blood pressure has been ranging anywhere from 99 systolic to 209.    At high risk for falls PT OT has been consulted.  Seizures (HCC) Continue aspiration precaution and Keppra .   DVT prophylaxis: lovenox  Family Communication:none Status is: Observation The patient will require care spanning > 2 midnights and should be moved to inpatient because: Acute respiratory failure with hypoxia possibly due to acute community-acquired pneumonia    Code Status:     Code Status Orders  (From admission, onward)           Start     Ordered   10/17/23 1600  Full code  Continuous       Question:  By:  Answer:  Consent: discussion documented in EHR   10/17/23 1602           Code Status History     Date Active Date Inactive Code Status Order ID Comments User Context   02/04/2022 1429 02/09/2022 1653 Full Code [id number]  Ron Cobbs, PA-C Inpatient   02/01/2022 0920 02/01/2022 1836 Full Code [id number]  Margherita Shell, MD Inpatient   04/01/2020 1830 04/02/2020 0117 Full Code [id number]  Young Hensen, MD Inpatient   10/22/2019 1324 10/22/2019 2114 Full Code [id number]  Margherita Shell, MD Inpatient   06/23/2019 1852 07/05/2019 2138 Full Code [id number]  Gattis Kass, MD Inpatient   02/20/2019 1842 02/21/2019 2014 Full Code [id number]  Luellen Sages,  MD Inpatient      Advance Directive Documentation    Flowsheet Row Most Recent Value  Type of Advance Directive Healthcare Power of Attorney  Pre-existing out of facility DNR order (yellow form or pink MOST form) --  "MOST" Form in Place? --         IV Access:   Peripheral IV   Procedures and diagnostic studies:   No results  found.   Medical Consultants:   None.   Subjective:    Taylor [name] she relates she still short of breath continues to have intermittent chest pain  Objective:    Vitals:   10/17/23 2000 10/17/23 2259 10/18/23 0253 10/18/23 0626  BP: 127/81 (!) 99/54 109/70   Pulse: (!) 104 100    Resp: 20 20 13    Temp:  97.7 F (36.5 C) 97.7 F (36.5 C)   TempSrc:  Oral Oral   SpO2: 93% 94% 95%   Weight:    95.3 kg  Height:       SpO2: 95 % O2 Flow Rate (L/min): 2 L/min   Intake/Output Summary (Last 24 hours) at 10/18/2023 0636 Last data filed at 10/18/2023 8119 Gross per 24 hour  Intake 1080 ml  Output 300 ml  Net 780 ml   Filed Weights   10/17/23 1457 10/17/23 1500 10/18/23 0626  Weight: 95 kg 96.4 kg 95.3 kg    Exam: General exam: In no acute distress. Respiratory system: Good air movement and clear to auscultation. Cardiovascular system: S1 & S2 heard, RRR. No JVD. Gastrointestinal system: Abdomen is nondistended, soft and nontender.  Extremities: No pedal edema. Skin: No rashes, lesions or ulcers Psychiatry: Judgement and insight appear normal. Mood & affect appropriate.    Data Reviewed:    Labs: Basic Metabolic Panel: Recent Labs  Lab 10/17/23 1625 10/18/23 0250  NA 133* 137  K 5.5* 4.8  CL 93* 97*  CO2 26 29  GLUCOSE 422* 248*  BUN 23* 27*  CREATININE 1.23* 1.10*  CALCIUM  9.6 9.9   GFR Estimated Creatinine Clearance: 74.5 mL/min (A) (by C-G formula based on SCr of 1.1 mg/dL (H)). Liver Function Tests: Recent Labs  Lab 10/17/23 1625  AST 36  ALT 26  ALKPHOS 76  BILITOT 0.6  PROT 7.1  ALBUMIN 3.6   No results for input(s): "LIPASE", "AMYLASE" in the last 168 hours. No results for input(s): "AMMONIA" in the last 168 hours. Coagulation profile No results for input(s): "INR", "PROTIME" in the last 168 hours. COVID-19 Labs  No results for input(s): "DDIMER", "FERRITIN", "LDH", "CRP" in the last 72 hours.  Lab Results  Component Value Date    SARSCOV2NAA NEGATIVE 03/31/2020   SARSCOV2NAA NEGATIVE 10/18/2019   SARSCOV2NAA Not Detected 05/14/2019   SARSCOV2NAA NEGATIVE 03/21/2019    CBC: Recent Labs  Lab 10/17/23 1625 10/18/23 0250  WBC 19.5* 16.1*  HGB 9.6* 8.9*  HCT 32.5* 30.5*  MCV 68.3* 68.8*  PLT 262 246   Cardiac Enzymes: No results for input(s): "CKTOTAL", "CKMB", "CKMBINDEX", "TROPONINI" in the last 168 hours. BNP (last 3 results) No results for input(s): "PROBNP" in the last 8760 hours. CBG: Recent Labs  Lab 10/17/23 1501 10/17/23 1824 10/17/23 1955 10/17/23 2218 10/18/23 0608  GLUCAP 453* 377* 464* 388* 252*   D-Dimer: No results for input(s): "DDIMER" in the last 72 hours. Hgb A1c: Recent Labs    10/17/23 1548  HGBA1C 8.0*   Lipid Profile: Recent Labs    10/18/23 0250  CHOL 136  HDL  59  LDLCALC 47  TRIG 150*  CHOLHDL 2.3   Thyroid function studies: Recent Labs    10/17/23 1625  TSH 0.725   Anemia work up: No results for input(s): "VITAMINB12", "FOLATE", "FERRITIN", "TIBC", "IRON ", "RETICCTPCT" in the last 72 hours. Sepsis Labs: Recent Labs  Lab 10/17/23 1625 10/18/23 0250  WBC 19.5* 16.1*   Microbiology Recent Results (from the past 240 hours)  Respiratory (~20 pathogens) panel by PCR     Status: None   Collection Time: 10/17/23  7:55 PM   Specimen: Nasopharyngeal Swab; Respiratory  Result Value Ref Range Status   Adenovirus NOT DETECTED NOT DETECTED Final   Coronavirus 229E NOT DETECTED NOT DETECTED Final    Comment: (NOTE) The Coronavirus on the Respiratory Panel, DOES NOT test for the novel  Coronavirus (2019 nCoV)    Coronavirus HKU1 NOT DETECTED NOT DETECTED Final   Coronavirus NL63 NOT DETECTED NOT DETECTED Final   Coronavirus OC43 NOT DETECTED NOT DETECTED Final   Metapneumovirus NOT DETECTED NOT DETECTED Final   Rhinovirus / Enterovirus NOT DETECTED NOT DETECTED Final   Influenza A NOT DETECTED NOT DETECTED Final   Influenza B NOT DETECTED NOT DETECTED  Final   Parainfluenza Virus 1 NOT DETECTED NOT DETECTED Final   Parainfluenza Virus 2 NOT DETECTED NOT DETECTED Final   Parainfluenza Virus 3 NOT DETECTED NOT DETECTED Final   Parainfluenza Virus 4 NOT DETECTED NOT DETECTED Final   Respiratory Syncytial Virus NOT DETECTED NOT DETECTED Final   Bordetella pertussis NOT DETECTED NOT DETECTED Final   Bordetella Parapertussis NOT DETECTED NOT DETECTED Final   Chlamydophila pneumoniae NOT DETECTED NOT DETECTED Final   Mycoplasma pneumoniae NOT DETECTED NOT DETECTED Final    Comment: Performed at [facility], [address], [place], [place] [address]  MRSA Next Gen by PCR, Nasal     Status: None   Collection Time: 10/17/23  7:55 PM   Specimen: Nasal Mucosa; Nasal Swab  Result Value Ref Range Status   MRSA by PCR Next Gen NOT DETECTED NOT DETECTED Final    Comment: (NOTE) The GeneXpert MRSA Assay (FDA approved for NASAL specimens only), is one component of a comprehensive MRSA colonization surveillance program. It is not intended to diagnose MRSA infection nor to guide or monitor treatment for MRSA infections. Test performance is not FDA approved in patients less than 40 years old. Performed at [facility], [address] [place], [place] [address]      Medications:    albuterol  2.5 mg Nebulization Q8H   aspirin  EC  81 mg Oral Daily   atorvastatin   80 mg Oral Daily   clopidogrel   75 mg Oral Daily   diazepam   5 mg Oral BID   diphenhydrAMINE   50 mg Oral Once   ferrous gluconate   324 mg Oral Daily   FLUoxetine   10 mg Oral Daily   heparin   5,000 Units Subcutaneous Q8H   insulin  aspart  0-20 Units Subcutaneous TID WC   insulin  aspart  0-5 Units Subcutaneous QHS   insulin  aspart  4 Units Subcutaneous TID WC   insulin  glargine-yfgn  5 Units Subcutaneous BID   levETIRAcetam   1,000 mg Oral Daily   methocarbamol   500 mg Oral BID   pantoprazole   40 mg Oral Daily   pregabalin   150 mg Oral TID   risperiDONE   0.5 mg Oral QHS    traMADol   50 mg Oral QID   traZODone   200 mg Oral QHS  Continuous Infusions:    LOS: 1 day   Macdonald Savoy  [facility]  10/18/2023, 6:36 AM

## 2023-10-18 NOTE — TOC Initial Note (Signed)
 Transition of Care [facility]) - Initial/Assessment Note    Patient Details  Name: Taylor [name] MRN: [id number] Date of Birth: 06-03-1981  Transition [facility] Care [facility] CM/SW Contact:    Jeani Mill, RN Phone Number: 10/18/2023, 1:35 PM  Clinical Narrative:                 Spoke to patient's Mom, [name], regarding transition needs.  Patient lives with her parents and has history of short term memory loss.  Patient has a walker. Patient has a new PCP with an apt 10/26/23 but mother can't remember the name. Parents transports patient to apts.  No TOC needs at this time.  Expected Discharge Plan: Home/Self Care Barriers to Discharge: Continued Medical Work up   Patient Goals and CMS Choice Patient states their goals for this hospitalization and ongoing recovery are:: mother want patient to return home          Expected Discharge Plan and Services   Discharge Planning Services: CM Consult   Living arrangements for the past 2 months: Single Family Home                                      Prior Living Arrangements/Services Living arrangements for the past 2 months: Single Family Home Lives with:: Parents Patient language and need for interpreter reviewed:: Yes Do you feel safe going back to the place where you live?: Yes      Need for Family Participation in Patient Care: Yes (Comment) Care giver support system in place?: Yes (comment) Current home services: DME (walker) Criminal Activity/Legal Involvement Pertinent to Current Situation/Hospitalization: No - Comment as needed  Activities of Daily Living   ADL Screening (condition at time of admission) Independently performs ADLs?: Yes (appropriate for developmental age) Is the patient deaf or have difficulty hearing?: No Does the patient have difficulty seeing, even when wearing glasses/contacts?: No Does the patient have difficulty concentrating, remembering, or making decisions?: No  Permission Sought/Granted                   Emotional Assessment         Alcohol / Substance Use: Not Applicable Psych Involvement: No (comment)  Admission diagnosis:  Chest pain [R07.9] Pneumonia [J18.9] Patient Active Problem List   Diagnosis Date Noted   Substernal chest pain 10/18/2023   Chest pain 10/17/2023   Pneumonia 10/17/2023   AKI (acute kidney injury) (HCC) 10/17/2023   Anemia 10/17/2023   Seizures (HCC)    Class 2 severe obesity due to excess calories with serious comorbidity and body mass index (BMI) of 38.0 to 38.9 in adult (HCC) 08/23/2023   At high risk for falls 08/23/2023   Claudication (HCC) 02/04/2022   Pain in right foot 07/22/2020   Diabetic foot infection (HCC) 06/23/2019   PAD (peripheral artery disease) (HCC)    Osteomyelitis of left foot (HCC)    Vertebral artery stenosis, left 03/25/2019   VBI (vertebrobasilar insufficiency) 02/20/2019   Intracranial carotid stenosis, left 01/23/2019   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   Tobacco use disorder 01/02/2015   Cognitive impairment 01/02/2015   Stroke (HCC) 09/07/2012   Pain in upper limb 09/07/2012   Pain in lower limb 09/07/2012   Bipolar affective disorder (HCC) 09/07/2012   Depressive disorder 09/07/2012   Type II diabetes mellitus (HCC) 09/07/2012   Memory loss 09/07/2012  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 - [place], [place] - [address] [place] [address] Phone: [phone] Fax: [phone]     Social Drivers of Health (SDOH) Social History: SDOH Screenings   Food Insecurity: No Food Insecurity (06/30/2023)   Received from [facility]  Transportation Needs: No  Transportation Needs (06/30/2023)   Received from [facility]  Utilities: Low Risk  (06/30/2023)   Received from [facility]  Financial Resource Strain: Low Risk  (06/30/2023)   Received from [facility]  Physical Activity: Insufficiently Active (06/30/2023)   Received from [facility]  Social Connections: Patient Unable To Answer (10/18/2023)  Stress: No Stress Concern Present (06/30/2023)   Received from [facility]  Tobacco Use: Medium Risk (10/17/2023)  Health Literacy: Medium Risk (06/30/2023)   Received from [facility]   SDOH Interventions:     Readmission Risk Interventions    02/09/2022   11:34 AM  Readmission Risk Prevention Plan  Post Dischage Appt Complete  Medication Screening Complete  Transportation Screening Complete

## 2023-10-18 NOTE — TOC CM/SW Note (Signed)
 Left Voicemail with Mother, [name], requesting a return call.

## 2023-10-18 NOTE — Progress Notes (Addendum)
 Patient Name: Taylor [name] Date of Encounter: 10/18/2023 [facility] Cardiologist: [name], MD   Interval Summary  .    She reports that she has chest pain but some mild to the point that she would not even mention it if not asked.  Reports still being extremely anxious and thinks that she might die but cannot explain why.  Reports mild shortness of breath but looks comfortable and on room air  Vital Signs .    Vitals:   10/17/23 2259 10/18/23 0253 10/18/23 0626 10/18/23 0736  BP: (!) 99/54 109/70  (!) 98/50  Pulse: 100   87  Resp: 20 13  13   Temp: 97.7 F (36.5 C) 97.7 F (36.5 C)  97.7 F (36.5 C)  TempSrc: Oral Oral  Oral  SpO2: 94% 95%  96%  Weight:   95.3 kg   Height:        Intake/Output Summary (Last 24 hours) at 10/18/2023 0744 Last data filed at 10/18/2023 1610 Gross per 24 hour  Intake 1080 ml  Output 300 ml  Net 780 ml      10/18/2023    6:26 AM 10/17/2023    3:00 PM 10/17/2023    2:57 PM  Last 3 Weights  Weight (lbs) 210 lb 1.6 oz 212 lb 9.6 oz 209 lb 7 oz  Weight (kg) 95.3 kg 96.435 kg 95 kg      Telemetry/ECG    Sinus rhythm heart rates in the 80s- Personally Reviewed  CV Studies    Echocardiogram 09/27/2023 Care Everywhere  1. The left ventricle is mildly dilated in size with normal wall thickness.    2. The left ventricular systolic function is mildly decreased, LVEF is  visually estimated at 45-50%.    3. There is mild mitral valve regurgitation.    4. The right ventricle is normal in size, with normal systolic function.  Physical Exam .   GEN: No acute distress.   Neck: No JVD Cardiac: RRR, no murmurs, rubs, or gallops.  Respiratory: Clear to auscultation bilaterally.  Limited exam, poor respiratory effort GI: Soft, nontender, non-distended  MS: No edema  Patient Profile    Taylor [name] is a 43 y.o. 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  Admitted for atypical chest pain elevated troponins.  Assessment & Plan .     Chest pain Reporting intermittent nonexertional chest pain with radiation to her left arm associated with panic attack and shortness of breath.  EKG with no acute ST-T wave changes, she has mild elevation of troponins with no real trend.  They have fluctuated between 30 to as high as 80.  Plan was for coronary CT today but medicine team had cancelled due to AKI.  Creatinine 1.2-1.1 today.  Will try again tomorrow if they are acceptable with current renal function.  Will repeat contrast allergy protocol again today and work with diabetic coordinator to adjust insulin  accordingly.  Heart rates in the 80s today, may need as needed beta-blocker prior to imaging tomorrow.   N.p.o. again tomorrow with the above adjustments.     Heart failure with mildly reduced EF Previous echocardiogram April 2025 reporting EF 45 to 50%.  Today she looks euvolemic without any significant complaints. PTA GDMT: Farxiga  10 mg, lisinopril  40 mg, spironolactone 12.5 mg.  These have all been held due to mild AKI/hyperkalemia, BP a little soft today so okay to hold but would resume Farxiga  at discharge as this can be continued even if GFR less than 25. Will transition labetalol  300 mg twice daily to Toprol -XL 50 mg, soft BP today.  She is on a substantial dose of beta-blocker, do not want to stop this abruptly.   Hyperlipidemia LDL is 47 on most recent lipid panel this admission On atorvastatin  80 mg  Hyperkalemia Mild, potassium 5.5 yesterday but now normalized.  Watch this carefully may need to stop spironolactone altogether. ________________________________________   Hospitalist managing:  Diabetes Diabetic coordinator following  A1c 8% this admission.  Pneumonia Diffuse infiltrates noted throughout  left lung on CT.   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 Charlotte Cookey. Repeat arterial duplex 10/03/2022 with patent graft.  No claudication symptoms. On DAPT, statin.  Anxiety Pain management/chronic back pain Seizure disorder Anemia  For questions or updates, please contact [facility] Please consult [email or web address] for contact info under        Signed, Burnetta Cart, PA-C    History and all data above reviewed.  Patient examined.  I agree with the findings as above.  She continues to complain about back pain.  Still with some SOB The patient exam reveals COR:RRR  ,  Lungs: Decreased breath sounds  ,  Abd: Positive bowel sounds, no rebound no guarding, Ext No edema   .  All available labs, radiology testing, previous records reviewed. Agree with documented assessment and plan. Chest pain:  Mostly non anginal but with decreased EF and significant risk factors we will plan coronary CT tomorrow.  Back pain:  We have giving scheduled pain meds.  Nursing reports that she is quite somnolent at times and I explained to her that we cannot give her more pain meds if her mental status is altered.  DM:  We greatly appreciate our hospitalist service and their help.  Pneumonia:  Continue antibiotics.   [name]  8:37 AM  10/18/2023

## 2023-10-19 ENCOUNTER — Other Ambulatory Visit: Payer: Self-pay | Admitting: Cardiology

## 2023-10-19 ENCOUNTER — Inpatient Hospital Stay (HOSPITAL_COMMUNITY)

## 2023-10-19 ENCOUNTER — Inpatient Hospital Stay (HOSPITAL_BASED_OUTPATIENT_CLINIC_OR_DEPARTMENT_OTHER)
Admission: AD | Admit: 2023-10-19 | Discharge: 2023-10-19 | Disposition: A | Discharge status: Home / Self Care | Source: Other Acute Inpatient Hospital | Attending: Cardiology | Admitting: Cardiology

## 2023-10-19 DIAGNOSIS — I259 Chronic ischemic heart disease, unspecified: Secondary | ICD-10-CM | POA: Diagnosis not present

## 2023-10-19 DIAGNOSIS — R931 Abnormal findings on diagnostic imaging of heart and coronary circulation: Secondary | ICD-10-CM

## 2023-10-19 DIAGNOSIS — I200 Unstable angina: Secondary | ICD-10-CM

## 2023-10-19 DIAGNOSIS — J189 Pneumonia, unspecified organism: Secondary | ICD-10-CM

## 2023-10-19 DIAGNOSIS — I2510 Atherosclerotic heart disease of native coronary artery without angina pectoris: Secondary | ICD-10-CM | POA: Diagnosis not present

## 2023-10-19 DIAGNOSIS — E1100 Type 2 diabetes mellitus with hyperosmolarity without nonketotic hyperglycemic-hyperosmolar coma (NKHHC): Secondary | ICD-10-CM | POA: Diagnosis not present

## 2023-10-19 DIAGNOSIS — N179 Acute kidney failure, unspecified: Secondary | ICD-10-CM | POA: Diagnosis not present

## 2023-10-19 LAB — BASIC METABOLIC PANEL WITH GFR
Anion gap: 9 (ref 5–15)
Anion gap: 12 (ref 5–15)
BUN: 26 mg/dL — ABNORMAL HIGH (ref 6–20)
BUN: 26 mg/dL — ABNORMAL HIGH (ref 6–20)
CO2: 26 mmol/L (ref 22–32)
CO2: 25 mmol/L (ref 22–32)
Calcium: 9 mg/dL (ref 8.9–10.3)
Calcium: 9 mg/dL (ref 8.9–10.3)
Chloride: 101 mmol/L (ref 98–111)
Chloride: 94 mmol/L — ABNORMAL LOW (ref 98–111)
Creatinine, Ser: 0.96 mg/dL (ref 0.44–1.00)
Creatinine, Ser: 1.37 mg/dL — ABNORMAL HIGH (ref 0.44–1.00)
GFR, Estimated: >60 mL/min (ref >60)
GFR, Estimated: 49 mL/min — ABNORMAL LOW (ref >60)
Glucose, Bld: 236 mg/dL — ABNORMAL HIGH (ref 70–99)
Glucose, Bld: 461 mg/dL — ABNORMAL HIGH (ref 70–99)
Potassium: 5 mmol/L (ref 3.5–5.1)
Potassium: 5.3 mmol/L — ABNORMAL HIGH (ref 3.5–5.1)
Sodium: 136 mmol/L (ref 135–145)
Sodium: 131 mmol/L — ABNORMAL LOW (ref 135–145)

## 2023-10-19 LAB — CBC
HCT: 30.4 % — ABNORMAL LOW (ref 36.0–46.0)
Hemoglobin: 8.7 g/dL — ABNORMAL LOW (ref 12.0–15.0)
MCH: 19.8 pg — ABNORMAL LOW (ref 26.0–34.0)
MCHC: 28.6 g/dL — ABNORMAL LOW (ref 30.0–36.0)
MCV: 69.2 fL — ABNORMAL LOW (ref 80.0–100.0)
Platelets: 215 10*3/uL (ref 150–400)
RBC: 4.39 MIL/uL (ref 3.87–5.11)
RDW: 18.4 % — ABNORMAL HIGH (ref 11.5–15.5)
WBC: 11.2 10*3/uL — ABNORMAL HIGH (ref 4.0–10.5)
nRBC: 0 % (ref 0.0–0.2)

## 2023-10-19 LAB — GLUCOSE, CAPILLARY
Glucose-Capillary: 250 mg/dL — ABNORMAL HIGH (ref 70–99)
Glucose-Capillary: 275 mg/dL — ABNORMAL HIGH (ref 70–99)

## 2023-10-19 LAB — LIPOPROTEIN A (LPA): Lipoprotein (a): 30.5 nmol/L — ABNORMAL HIGH (ref <75.0)

## 2023-10-19 MED ORDER — METOPROLOL TARTRATE 5 MG/5ML IV SOLN
INTRAVENOUS | Status: AC
  Filled 2023-10-19: qty 10

## 2023-10-19 MED ORDER — IVABRADINE HCL 5 MG PO TABS
5.0000 mg | ORAL_TABLET | Freq: Once | ORAL | Status: AC
  Administered 2023-10-19: 5 mg via ORAL
  Filled 2023-10-19 (×2): qty 1

## 2023-10-19 MED ORDER — NITROGLYCERIN 0.4 MG SL SUBL
SUBLINGUAL_TABLET | SUBLINGUAL | Status: AC
  Filled 2023-10-19: qty 2

## 2023-10-19 MED ORDER — INSULIN ASPART 100 UNIT/ML IJ SOLN
4.0000 [IU] | Freq: Three times a day (TID) | INTRAMUSCULAR | Status: DC
  Administered 2023-10-19 – 2023-10-20 (×3): 4 [IU] via SUBCUTANEOUS

## 2023-10-19 MED ORDER — ASPIRIN 81 MG PO CHEW: 81.0000 mg | CHEWABLE_TABLET | ORAL | Status: DC

## 2023-10-19 MED ORDER — DIPHENHYDRAMINE HCL 25 MG PO CAPS
50.0000 mg | ORAL_CAPSULE | Freq: Once | ORAL | Status: AC
Start: 2023-10-20 — End: 2023-10-20
  Administered 2023-10-20: 50 mg via ORAL
  Filled 2023-10-19: qty 2

## 2023-10-19 MED ORDER — AZITHROMYCIN 500 MG PO TABS
500.0000 mg | ORAL_TABLET | Freq: Every day | ORAL | Status: DC
  Administered 2023-10-19 – 2023-10-24 (×6): 500 mg via ORAL
  Filled 2023-10-19 (×6): qty 1

## 2023-10-19 MED ORDER — IVABRADINE HCL 5 MG PO TABS: 7.5000 mg | ORAL_TABLET | Freq: Once | ORAL | Status: DC

## 2023-10-19 MED ORDER — INSULIN ASPART 100 UNIT/ML IJ SOLN
20.0000 [IU] | Freq: Once | INTRAMUSCULAR | Status: AC
  Administered 2023-10-19: 20 [IU] via SUBCUTANEOUS

## 2023-10-19 MED ORDER — HYDROCODONE-ACETAMINOPHEN 5-325 MG PO TABS
1.0000 | ORAL_TABLET | ORAL | Status: DC | PRN
  Administered 2023-10-19: 1 via ORAL
  Administered 2023-10-20 (×2): 2 via ORAL
  Administered 2023-10-20: 1 via ORAL
  Administered 2023-10-20 – 2023-10-21 (×4): 2 via ORAL
  Administered 2023-10-21: 1 via ORAL
  Administered 2023-10-22 (×3): 2 via ORAL
  Administered 2023-10-22: 1 via ORAL
  Administered 2023-10-23 (×2): 2 via ORAL
  Administered 2023-10-23: 1 via ORAL
  Administered 2023-10-23 – 2023-10-26 (×12): 2 via ORAL
  Filled 2023-10-19 (×7): qty 2
  Filled 2023-10-19: qty 1
  Filled 2023-10-19 (×17): qty 2
  Filled 2023-10-19 (×4): qty 1

## 2023-10-19 MED ORDER — POLYETHYLENE GLYCOL 3350 17 G PO PACK: 17.0000 g | PACK | Freq: Two times a day (BID) | ORAL | Status: DC

## 2023-10-19 MED ORDER — SODIUM CHLORIDE 0.9 % IV SOLN: INTRAVENOUS | Status: DC

## 2023-10-19 MED ORDER — PREDNISONE 20 MG PO TABS
50.0000 mg | ORAL_TABLET | Freq: Four times a day (QID) | ORAL | Status: AC
  Administered 2023-10-20 (×3): 50 mg via ORAL
  Filled 2023-10-19 (×3): qty 1

## 2023-10-19 MED ORDER — IOHEXOL 350 MG/ML SOLN
95.0000 mL | Freq: Once | INTRAVENOUS | Status: AC | PRN
  Administered 2023-10-19: 95 mL via INTRAVENOUS

## 2023-10-19 NOTE — Progress Notes (Addendum)
 Patient Name: Taylor [name] Date of Encounter: 10/19/2023 [facility] Cardiologist: [name], MD   Interval Summary  .    Plan for coronary CT today.  She has been premedicated.  Reporting mild shortness of breath but looks comfortable.  Vitals:   10/19/23 0545 10/19/23 0746 10/19/23 0800 10/19/23 1013  BP:  106/66 113/72 111/68  Pulse: 90 83 78 85  Resp: (!) 21 13 15 16   Temp:  97.8 F (36.6 C)  97.8 F (36.6 C)  TempSrc:  Oral  Oral  SpO2: 97% 99% 99% 97%  Weight: 98.4 kg     Height:        Intake/Output Summary (Last 24 hours) at 10/19/2023 1051 Last data filed at 10/19/2023 0900 Gross per 24 hour  Intake 3176.52 ml  Output 1850 ml  Net 1326.52 ml      10/19/2023    5:45 AM 10/18/2023    6:26 AM 10/17/2023    3:00 PM  Last 3 Weights  Weight (lbs) 216 lb 14.9 oz 210 lb 1.6 oz 212 lb 9.6 oz  Weight (kg) 98.4 kg 95.3 kg 96.435 kg      Telemetry/ECG    Sinus rhythm heart rates in the 80s- Personally Reviewed  CV Studies    Echocardiogram 09/27/2023 Care Everywhere  1. The left ventricle is mildly dilated in size with normal wall thickness.    2. The left ventricular systolic function is mildly decreased, LVEF is  visually estimated at 45-50%.    3. There is mild mitral valve regurgitation.    4. The right ventricle is normal in size, with normal systolic function.  Physical Exam .   GEN: No acute distress.   Neck: No JVD Cardiac: RRR, no murmurs, rubs, or gallops.  Respiratory: Clear to auscultation bilaterally.  Limited exam, poor respiratory effort GI: Soft, nontender, non-distended  MS: No edema  Patient Profile    Taylor [name] is a 43 y.o. 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  Admitted for atypical chest pain elevated  troponins.  Assessment & Plan .     Chest pain Reporting intermittent nonexertional chest pain with radiation to her left arm associated with panic attack and shortness of breath.  EKG with no acute ST-T wave changes, she has mild elevation of troponins with no real trend.  They have fluctuated between 30 to as high as 80.  Pending coronary CT today.  She has been premedicated for contrast allergy. Heart rates in the 80s today, will discuss with nurse coordinator if they want to give any as needed right now.  She is on a lot of anxiety medications and narcotics though.   Heart failure with mildly reduced EF Previous echocardiogram April 2025 reporting EF 45 to 50%.  Today she looks euvolemic without any significant complaints. PTA GDMT: Farxiga  10 mg, lisinopril  40 mg, spironolactone 12.5 mg.  These have all been held due to mild AKI/hyperkalemia.  Will plan to resume these at discharge, may be not spironolactone. Stopped labetalol  and on Toprol -XL 50 mg now.  Hyperlipidemia LDL is 47 on most recent lipid panel this admission On atorvastatin  80 mg  Hyperkalemia/AKI Resolved.   ________________________________________   Hospitalist managing:  Diabetes Diabetic coordinator following  A1c 8% this admission.  Pneumonia Diffuse infiltrates noted throughout left  lung on CT.   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 Charlotte Cookey. Repeat arterial duplex 10/03/2022 with patent graft.  No claudication symptoms. On DAPT, statin.  Anxiety Pain management/chronic back pain Seizure disorder Anemia  For questions or updates, please contact [facility] Please consult [email or web address] for contact info under        Signed, Burnetta Cart, PA-C    History and all data above reviewed.  Patient examined.  I agree with the findings as above.  The patient continues to complain about headache and back pain.  Breathing is better than before.   The patient exam reveals COR:RRR  ,  Lungs: Clear  ,  Abd: Positive bowel sounds, no rebound no guarding, Ext No edema  .  All available labs, radiology testing, previous records reviewed. Agree with documented assessment and plan.   Chest pain:  CT with diffuse severe disease.  High grade proximal LAD disease.  Given this and the continued diffuse pain which does include chest pain and the mild enzyme elevation with elevated though non diagnostic enzymes.  Cath is indicated.  The patient understands that risks included but are not limited to stroke (1 in 1000), death (1 in 1000), kidney failure [usually temporary] (1 in 500), bleeding (1 in 200), allergic reaction [possibly serious] (1 in 200).  The patient understands and agrees to proceed.     Royston Cornea Chandi Nicklin  4:16 PM  10/19/2023

## 2023-10-19 NOTE — H&P (View-Only) (Signed)
 Patient Name: Taylor [name] Date of Encounter: 10/19/2023 [facility] Cardiologist: [name], MD   Interval Summary  .    Plan for coronary CT today.  She has been premedicated.  Reporting mild shortness of breath but looks comfortable.  Vitals:   10/19/23 0545 10/19/23 0746 10/19/23 0800 10/19/23 1013  BP:  106/66 113/72 111/68  Pulse: 90 83 78 85  Resp: (!) 21 13 15 16   Temp:  97.8 F (36.6 C)  97.8 F (36.6 C)  TempSrc:  Oral  Oral  SpO2: 97% 99% 99% 97%  Weight: 98.4 kg     Height:        Intake/Output Summary (Last 24 hours) at 10/19/2023 1051 Last data filed at 10/19/2023 0900 Gross per 24 hour  Intake 3176.52 ml  Output 1850 ml  Net 1326.52 ml      10/19/2023    5:45 AM 10/18/2023    6:26 AM 10/17/2023    3:00 PM  Last 3 Weights  Weight (lbs) 216 lb 14.9 oz 210 lb 1.6 oz 212 lb 9.6 oz  Weight (kg) 98.4 kg 95.3 kg 96.435 kg      Telemetry/ECG    Sinus rhythm heart rates in the 80s- Personally Reviewed  CV Studies    Echocardiogram 09/27/2023 Care Everywhere  1. The left ventricle is mildly dilated in size with normal wall thickness.    2. The left ventricular systolic function is mildly decreased, LVEF is  visually estimated at 45-50%.    3. There is mild mitral valve regurgitation.    4. The right ventricle is normal in size, with normal systolic function.  Physical Exam .   GEN: No acute distress.   Neck: No JVD Cardiac: RRR, no murmurs, rubs, or gallops.  Respiratory: Clear to auscultation bilaterally.  Limited exam, poor respiratory effort GI: Soft, nontender, non-distended  MS: No edema  Patient Profile    Taylor [name] is a 43 y.o. 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  Admitted for atypical chest pain elevated  troponins.  Assessment & Plan .     Chest pain Reporting intermittent nonexertional chest pain with radiation to her left arm associated with panic attack and shortness of breath.  EKG with no acute ST-T wave changes, she has mild elevation of troponins with no real trend.  They have fluctuated between 30 to as high as 80.  Pending coronary CT today.  She has been premedicated for contrast allergy. Heart rates in the 80s today, will discuss with nurse coordinator if they want to give any as needed right now.  She is on a lot of anxiety medications and narcotics though.   Heart failure with mildly reduced EF Previous echocardiogram April 2025 reporting EF 45 to 50%.  Today she looks euvolemic without any significant complaints. PTA GDMT: Farxiga  10 mg, lisinopril  40 mg, spironolactone 12.5 mg.  These have all been held due to mild AKI/hyperkalemia.  Will plan to resume these at discharge, may be not spironolactone. Stopped labetalol  and on Toprol -XL 50 mg now.  Hyperlipidemia LDL is 47 on most recent lipid panel this admission On atorvastatin  80 mg  Hyperkalemia/AKI Resolved.   ________________________________________   Hospitalist managing:  Diabetes Diabetic coordinator following  A1c 8% this admission.  Pneumonia Diffuse infiltrates noted throughout left  lung on CT.   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 Charlotte Cookey. Repeat arterial duplex 10/03/2022 with patent graft.  No claudication symptoms. On DAPT, statin.  Anxiety Pain management/chronic back pain Seizure disorder Anemia  For questions or updates, please contact [facility] Please consult [email or web address] for contact info under        Signed, Burnetta Cart, PA-C    History and all data above reviewed.  Patient examined.  I agree with the findings as above.  The patient continues to complain about headache and back pain.  Breathing is better than before.   The patient exam reveals COR:RRR  ,  Lungs: Clear  ,  Abd: Positive bowel sounds, no rebound no guarding, Ext No edema  .  All available labs, radiology testing, previous records reviewed. Agree with documented assessment and plan.   Chest pain:  CT with diffuse severe disease.  High grade proximal LAD disease.  Given this and the continued diffuse pain which does include chest pain and the mild enzyme elevation with elevated though non diagnostic enzymes.  Cath is indicated.  The patient understands that risks included but are not limited to stroke (1 in 1000), death (1 in 1000), kidney failure [usually temporary] (1 in 500), bleeding (1 in 200), allergic reaction [possibly serious] (1 in 200).  The patient understands and agrees to proceed.     Taylor [name]  4:16 PM  10/19/2023

## 2023-10-19 NOTE — Plan of Care (Signed)
  Problem: Clinical Measurements: Goal: Ability to maintain clinical measurements within normal limits will improve Outcome: Progressing Goal: Will remain free from infection Outcome: Progressing Goal: Respiratory complications will improve Outcome: Progressing   Problem: Activity: Goal: Risk for activity intolerance will decrease Outcome: Progressing   Problem: Nutrition: Goal: Adequate nutrition will be maintained Outcome: Progressing   Problem: Elimination: Goal: Will not experience complications related to bowel motility Outcome: Progressing Goal: Will not experience complications related to urinary retention Outcome: Progressing   Problem: Safety: Goal: Ability to remain free from injury will improve Outcome: Progressing   Problem: Skin Integrity: Goal: Risk for impaired skin integrity will decrease Outcome: Progressing   Problem: Education: Goal: Ability to describe self-care measures that may prevent or decrease complications (Diabetes Survival Skills Education) will improve Outcome: Progressing Goal: Individualized Educational Video(s) Outcome: Progressing   Problem: Fluid Volume: Goal: Ability to maintain a balanced intake and output will improve Outcome: Progressing   Problem: Nutritional: Goal: Maintenance of adequate nutrition will improve Outcome: Progressing   Problem: Skin Integrity: Goal: Risk for impaired skin integrity will decrease Outcome: Progressing   Problem: Tissue Perfusion: Goal: Adequacy of tissue perfusion will improve Outcome: Progressing   Problem: Education: Goal: Individualized Educational Video(s) Outcome: Progressing

## 2023-10-19 NOTE — Progress Notes (Signed)
 [facility] CONSULT PROGRESS NOTE    Progress Note  Taylor [name]  WUJ:[id number] DOB: 03-12-1981 DOA: 10/17/2023 PCP: Theoplis Fix, MD   Brief Narrative:   Taylor [name] is an 43 y.o. female past medical history of diabetes mellitus type 2, essential hypertension, history of CVA with short-term memory problems, history of seizure and strokes comes in for [facility] for intermittent chest pain that started the day prior to admission   Assessment/Plan:   Acute respiratory failure probably with hypoxia 2/2 Pneumonia: CTA done at [facility] for elevated D-dimer showed left lower lobe and right lung base infiltrate with a white count of 11.2. As remain afebrile leukocytosis continues to improve. Continue current antibiotic regimen  Atypical chest pain Her cardiac biomarkers from [facility] are basically been flat in the 80s. CT coronary was delayed due to acute kidney injury hopefully she will get it today.   Further management per cardiology. She is being premedicated for contrast allergy.  Acute kidney injury: Hold Farxiga , ACE inhibitor and NSAIDs, she was just given contrast. With a baseline creatinine of 0.8, and [place] it was 1.2. KVO IV fluids creatinine has improved to baseline.  Chronic systolic heart failure: Echo in April 2025 showed an EF of 45%. Farxiga  and ACE inhibitor were held due to acute kidney injury. Continue metoprolol   Type II diabetes mellitus (HCC) Last A1c of 7.8.  Started on prednisone Continue long-acting insulin  plus sliding scale blood glucose elevated, she is on prednisone for her allergy to contrast.  Hopefully today will be the last day of prednisone.  Microcytic anemia Patient is on Eliquis, hemoglobin has remained relatively stable at 8.9, MCV is low likely iron  deficient. Ferritin of 25, iron  of 24 TIBC of 427 saturation of 6%, started on oral iron . FOBT is pending.  Depressive disorder Continue Prozac  and  trazodone .  PAD (peripheral artery disease) (HCC) Continue aspirin , Plavix  statins and beta-blockers.  Essential hypertension: ACE inhibitor  were held due to borderline blood pressure. Blood pressure is stable.    At high risk for falls PT OT has been consulted.  Seizures (HCC) Continue aspiration precaution and Keppra .   DVT prophylaxis: lovenox  Family Communication:none Status is: Observation The patient will require care spanning > 2 midnights and should be moved to inpatient because: Acute respiratory failure with hypoxia possibly due to acute community-acquired pneumonia    Code Status:     Code Status Orders  (From admission, onward)           Start     Ordered   10/17/23 1600  Full code  Continuous       Question:  By:  Answer:  Consent: discussion documented in EHR   10/17/23 1602           Code Status History     Date Active Date Inactive Code Status Order ID Comments User Context   02/04/2022 1429 02/09/2022 1653 Full Code [id number]  Ron Cobbs, PA-C Inpatient   02/01/2022 0920 02/01/2022 1836 Full Code [id number]  Margherita Shell, MD Inpatient   04/01/2020 1830 04/02/2020 0117 Full Code [id number]  Young Hensen, MD Inpatient   10/22/2019 1324 10/22/2019 2114 Full Code [id number]  Margherita Shell, MD Inpatient   06/23/2019 1852 07/05/2019 2138 Full Code [id number]  Gattis Kass, MD Inpatient   02/20/2019 1842 02/21/2019 2014 Full Code [id number]  Luellen Sages, MD Inpatient      Advance Directive Documentation    Flowsheet Row Most Recent  Value  Type of Advance Directive Healthcare Power of Attorney  Pre-existing out of facility DNR order (yellow form or pink MOST form) --  "MOST" Form in Place? --         IV Access:   Peripheral IV   Procedures and diagnostic studies:   No results found.   Medical Consultants:   None.   Subjective:    Taylor [name] continues to have pain relates her shortness of breath is about  the same.  Objective:    Vitals:   10/18/23 2354 10/19/23 0328 10/19/23 0400 10/19/23 0545  BP: 95/73 (!) 101/57 102/65   Pulse: 91  81 90  Resp: 20 16 16  (!) 21  Temp:  97.7 F (36.5 C)    TempSrc:  Oral    SpO2: 96% 95% 99% 97%  Weight:    98.4 kg  Height:       SpO2: 97 % O2 Flow Rate (L/min): 2 L/min   Intake/Output Summary (Last 24 hours) at 10/19/2023 0652 Last data filed at 10/19/2023 0300 Gross per 24 hour  Intake 3175.52 ml  Output 1250 ml  Net 1925.52 ml   Filed Weights   10/17/23 1500 10/18/23 0626 10/19/23 0545  Weight: 96.4 kg 95.3 kg 98.4 kg    Exam: General exam: In no acute distress. Respiratory system: Good air movement and clear to auscultation. Cardiovascular system: S1 & S2 heard, RRR. No JVD. Gastrointestinal system: Abdomen is nondistended, soft and nontender.  Extremities: No pedal edema. Skin: No rashes, lesions or ulcers Psychiatry: Judgement and insight appear normal. Mood & affect appropriate.  Data Reviewed:    Labs: Basic Metabolic Panel: Recent Labs  Lab 10/17/23 1625 10/18/23 0250 10/19/23 0257  NA 133* 137 136  K 5.5* 4.8 5.0  CL 93* 97* 101  CO2 26 29 26   GLUCOSE 422* 248* 236*  BUN 23* 27* 26*  CREATININE 1.23* 1.10* 0.96  CALCIUM  9.6 9.9 9.0   GFR Estimated Creatinine Clearance: 86.9 mL/min (by C-G formula based on SCr of 0.96 mg/dL). Liver Function Tests: Recent Labs  Lab 10/17/23 1625  AST 36  ALT 26  ALKPHOS 76  BILITOT 0.6  PROT 7.1  ALBUMIN 3.6   No results for input(s): "LIPASE", "AMYLASE" in the last 168 hours. No results for input(s): "AMMONIA" in the last 168 hours. Coagulation profile No results for input(s): "INR", "PROTIME" in the last 168 hours. COVID-19 Labs  Recent Labs    10/18/23 0827  FERRITIN 25    Lab Results  Component Value Date   SARSCOV2NAA NEGATIVE 03/31/2020   SARSCOV2NAA NEGATIVE 10/18/2019   SARSCOV2NAA Not Detected 05/14/2019   SARSCOV2NAA NEGATIVE 03/21/2019     CBC: Recent Labs  Lab 10/17/23 1625 10/18/23 0250 10/19/23 0257  WBC 19.5* 16.1* 11.2*  HGB 9.6* 8.9* 8.7*  HCT 32.5* 30.5* 30.4*  MCV 68.3* 68.8* 69.2*  PLT 262 246 215   Cardiac Enzymes: No results for input(s): "CKTOTAL", "CKMB", "CKMBINDEX", "TROPONINI" in the last 168 hours. BNP (last 3 results) No results for input(s): "PROBNP" in the last 8760 hours. CBG: Recent Labs  Lab 10/18/23 0608 10/18/23 1100 10/18/23 1631 10/18/23 2130 10/19/23 0552  GLUCAP 252* 353* 218* 255* 250*   D-Dimer: No results for input(s): "DDIMER" in the last 72 hours. Hgb A1c: Recent Labs    10/17/23 1548  HGBA1C 8.0*   Lipid Profile: Recent Labs    10/18/23 0250  CHOL 136  HDL 59  LDLCALC 47  TRIG 150*  CHOLHDL 2.3   Thyroid function studies: Recent Labs    10/17/23 1625  TSH 0.725   Anemia work up: Recent Labs    10/18/23 0827  VITAMINB12 643  FOLATE 9.2  FERRITIN 25  TIBC 427  IRON  24*  RETICCTPCT 1.2   Sepsis Labs: Recent Labs  Lab 10/17/23 1625 10/18/23 0250 10/19/23 0257  WBC 19.5* 16.1* 11.2*   Microbiology Recent Results (from the past 240 hours)  Respiratory (~20 pathogens) panel by PCR     Status: None   Collection Time: 10/17/23  7:55 PM   Specimen: Nasopharyngeal Swab; Respiratory  Result Value Ref Range Status   Adenovirus NOT DETECTED NOT DETECTED Final   Coronavirus 229E NOT DETECTED NOT DETECTED Final    Comment: (NOTE) The Coronavirus on the Respiratory Panel, DOES NOT test for the novel  Coronavirus (2019 nCoV)    Coronavirus HKU1 NOT DETECTED NOT DETECTED Final   Coronavirus NL63 NOT DETECTED NOT DETECTED Final   Coronavirus OC43 NOT DETECTED NOT DETECTED Final   Metapneumovirus NOT DETECTED NOT DETECTED Final   Rhinovirus / Enterovirus NOT DETECTED NOT DETECTED Final   Influenza A NOT DETECTED NOT DETECTED Final   Influenza B NOT DETECTED NOT DETECTED Final   Parainfluenza Virus 1 NOT DETECTED NOT DETECTED Final    Parainfluenza Virus 2 NOT DETECTED NOT DETECTED Final   Parainfluenza Virus 3 NOT DETECTED NOT DETECTED Final   Parainfluenza Virus 4 NOT DETECTED NOT DETECTED Final   Respiratory Syncytial Virus NOT DETECTED NOT DETECTED Final   Bordetella pertussis NOT DETECTED NOT DETECTED Final   Bordetella Parapertussis NOT DETECTED NOT DETECTED Final   Chlamydophila pneumoniae NOT DETECTED NOT DETECTED Final   Mycoplasma pneumoniae NOT DETECTED NOT DETECTED Final    Comment: Performed at [facility], [address] [place], [place] [address]  MRSA Next Gen by PCR, Nasal     Status: None   Collection Time: 10/17/23  7:55 PM   Specimen: Nasal Mucosa; Nasal Swab  Result Value Ref Range Status   MRSA by PCR Next Gen NOT DETECTED NOT DETECTED Final    Comment: (NOTE) The GeneXpert MRSA Assay (FDA approved for NASAL specimens only), is one component of a comprehensive MRSA colonization surveillance program. It is not intended to diagnose MRSA infection nor to guide or monitor treatment for MRSA infections. Test performance is not FDA approved in patients less than 50 years old. Performed at [facility], [address] [place], [place] [address]      Medications:    aspirin  EC  81 mg Oral Daily   atorvastatin   80 mg Oral Daily   clopidogrel   75 mg Oral Daily   diazepam   5 mg Oral BID   diphenhydrAMINE   50 mg Oral Once   ferrous gluconate   324 mg Oral Daily   FLUoxetine   10 mg Oral Daily   heparin   5,000 Units Subcutaneous Q8H   insulin  aspart  0-20 Units Subcutaneous TID WC   insulin  aspart  0-5 Units Subcutaneous QHS   insulin  glargine-yfgn  10 Units Subcutaneous BID   levETIRAcetam   1,000 mg Oral Daily   magnesium  oxide  400 mg Oral Daily   methocarbamol   500 mg Oral BID   metoprolol  succinate  50 mg Oral Daily   pantoprazole   40 mg Oral Daily   predniSONE  50 mg Oral Q6H   pregabalin   150 mg Oral TID   risperiDONE   0.5 mg Oral QHS   traMADol   50 mg Oral QID    traZODone   200 mg Oral QHS   Continuous Infusions:  sodium chloride  75 mL/hr at 10/19/23 0300   azithromycin Stopped (10/18/23 1000)   cefTRIAXone (ROCEPHIN)  IV Stopped (10/18/23 1120)      LOS: 2 days   Macdonald Savoy  [facility]  10/19/2023, 6:52 AM

## 2023-10-20 ENCOUNTER — Inpatient Hospital Stay (HOSPITAL_COMMUNITY)

## 2023-10-20 ENCOUNTER — Encounter (HOSPITAL_COMMUNITY)
Admission: AD | Disposition: A | Discharge status: Home / Self Care | Payer: Self-pay | Source: Other Acute Inpatient Hospital | Attending: Internal Medicine

## 2023-10-20 DIAGNOSIS — I2510 Atherosclerotic heart disease of native coronary artery without angina pectoris: Secondary | ICD-10-CM | POA: Diagnosis not present

## 2023-10-20 DIAGNOSIS — I259 Chronic ischemic heart disease, unspecified: Secondary | ICD-10-CM | POA: Diagnosis not present

## 2023-10-20 DIAGNOSIS — J189 Pneumonia, unspecified organism: Secondary | ICD-10-CM | POA: Diagnosis not present

## 2023-10-20 DIAGNOSIS — I200 Unstable angina: Secondary | ICD-10-CM | POA: Diagnosis not present

## 2023-10-20 DIAGNOSIS — I509 Heart failure, unspecified: Secondary | ICD-10-CM | POA: Diagnosis not present

## 2023-10-20 DIAGNOSIS — E785 Hyperlipidemia, unspecified: Secondary | ICD-10-CM | POA: Diagnosis not present

## 2023-10-20 DIAGNOSIS — R072 Precordial pain: Secondary | ICD-10-CM | POA: Diagnosis not present

## 2023-10-20 DIAGNOSIS — E1100 Type 2 diabetes mellitus with hyperosmolarity without nonketotic hyperglycemic-hyperosmolar coma (NKHHC): Secondary | ICD-10-CM | POA: Diagnosis not present

## 2023-10-20 DIAGNOSIS — N179 Acute kidney failure, unspecified: Secondary | ICD-10-CM | POA: Diagnosis not present

## 2023-10-20 HISTORY — PX: LEFT HEART CATH AND CORONARY ANGIOGRAPHY: CATH118249

## 2023-10-20 LAB — CBC
HCT: 31.6 % — ABNORMAL LOW (ref 36.0–46.0)
Hemoglobin: 9.1 g/dL — ABNORMAL LOW (ref 12.0–15.0)
MCH: 19.9 pg — ABNORMAL LOW (ref 26.0–34.0)
MCHC: 28.8 g/dL — ABNORMAL LOW (ref 30.0–36.0)
MCV: 69 fL — ABNORMAL LOW (ref 80.0–100.0)
Platelets: 252 10*3/uL (ref 150–400)
RBC: 4.58 MIL/uL (ref 3.87–5.11)
RDW: 18.1 % — ABNORMAL HIGH (ref 11.5–15.5)
WBC: 18.4 10*3/uL — ABNORMAL HIGH (ref 4.0–10.5)
nRBC: 0 % (ref 0.0–0.2)

## 2023-10-20 LAB — BASIC METABOLIC PANEL WITH GFR
Anion gap: 8 (ref 5–15)
BUN: 23 mg/dL — ABNORMAL HIGH (ref 6–20)
CO2: 27 mmol/L (ref 22–32)
Calcium: 9.8 mg/dL (ref 8.9–10.3)
Chloride: 101 mmol/L (ref 98–111)
Creatinine, Ser: 0.99 mg/dL (ref 0.44–1.00)
GFR, Estimated: >60 mL/min (ref >60)
Glucose, Bld: 254 mg/dL — ABNORMAL HIGH (ref 70–99)
Potassium: 4.7 mmol/L (ref 3.5–5.1)
Sodium: 136 mmol/L (ref 135–145)

## 2023-10-20 LAB — GLUCOSE, CAPILLARY
Glucose-Capillary: 460 mg/dL — ABNORMAL HIGH (ref 70–99)
Glucose-Capillary: 499 mg/dL — ABNORMAL HIGH (ref 70–99)
Glucose-Capillary: 498 mg/dL — ABNORMAL HIGH (ref 70–99)
Glucose-Capillary: 390 mg/dL — ABNORMAL HIGH (ref 70–99)
Glucose-Capillary: 262 mg/dL — ABNORMAL HIGH (ref 70–99)
Glucose-Capillary: 272 mg/dL — ABNORMAL HIGH (ref 70–99)
Glucose-Capillary: 212 mg/dL — ABNORMAL HIGH (ref 70–99)
Glucose-Capillary: 578 mg/dL (ref 70–99)

## 2023-10-20 LAB — ECHOCARDIOGRAM COMPLETE
Area-P 1/2: 4.39 cm2
Height: 65 in
S' Lateral: 4.5 cm
Single Plane A4C EF: 35 %
Weight: 3414.48 [oz_av]

## 2023-10-20 SURGERY — LEFT HEART CATH AND CORONARY ANGIOGRAPHY
Anesthesia: LOCAL

## 2023-10-20 MED ORDER — MORPHINE SULFATE (PF) 2 MG/ML IV SOLN
2.0000 mg | Freq: Once | INTRAVENOUS | Status: AC
  Administered 2023-10-20: 2 mg via INTRAVENOUS
  Filled 2023-10-20: qty 1

## 2023-10-20 MED ORDER — VERAPAMIL HCL 2.5 MG/ML IV SOLN
INTRAVENOUS | Status: AC
  Filled 2023-10-20: qty 2

## 2023-10-20 MED ORDER — FENTANYL CITRATE (PF) 100 MCG/2ML IJ SOLN
INTRAMUSCULAR | Status: AC
  Filled 2023-10-20: qty 2

## 2023-10-20 MED ORDER — INSULIN GLARGINE-YFGN 100 UNIT/ML ~~LOC~~ SOLN
15.0000 [IU] | Freq: Two times a day (BID) | SUBCUTANEOUS | Status: DC
  Administered 2023-10-20 – 2023-10-21 (×2): 15 [IU] via SUBCUTANEOUS
  Filled 2023-10-20 (×3): qty 0.15

## 2023-10-20 MED ORDER — VERAPAMIL HCL 2.5 MG/ML IV SOLN
INTRAVENOUS | Status: DC | PRN
  Administered 2023-10-20: 10 mL via INTRA_ARTERIAL

## 2023-10-20 MED ORDER — HEPARIN SODIUM (PORCINE) 1000 UNIT/ML IJ SOLN
INTRAMUSCULAR | Status: AC
  Filled 2023-10-20: qty 10

## 2023-10-20 MED ORDER — HEPARIN SODIUM (PORCINE) 1000 UNIT/ML IJ SOLN
INTRAMUSCULAR | Status: DC | PRN
  Administered 2023-10-20: 5000 [IU] via INTRAVENOUS

## 2023-10-20 MED ORDER — HEPARIN (PORCINE) 25000 UT/250ML-% IV SOLN
1200.0000 [IU]/h | INTRAVENOUS | Status: DC
  Administered 2023-10-20 – 2023-10-22 (×3): 1100 [IU]/h via INTRAVENOUS
  Filled 2023-10-20 (×4): qty 250

## 2023-10-20 MED ORDER — HEPARIN (PORCINE) IN NACL 2000-0.9 UNIT/L-% IV SOLN
INTRAVENOUS | Status: DC | PRN
  Administered 2023-10-20: 1000 mL

## 2023-10-20 MED ORDER — MIDAZOLAM HCL 2 MG/2ML IJ SOLN
INTRAMUSCULAR | Status: DC | PRN
  Administered 2023-10-20: 1 mg via INTRAVENOUS

## 2023-10-20 MED ORDER — LIDOCAINE HCL (PF) 1 % IJ SOLN
INTRAMUSCULAR | Status: AC
  Filled 2023-10-20: qty 30

## 2023-10-20 MED ORDER — LIDOCAINE HCL (PF) 1 % IJ SOLN
INTRAMUSCULAR | Status: DC | PRN
  Administered 2023-10-20: 2 mL

## 2023-10-20 MED ORDER — MIDAZOLAM HCL 2 MG/2ML IJ SOLN
INTRAMUSCULAR | Status: AC
  Filled 2023-10-20: qty 2

## 2023-10-20 MED ORDER — IOHEXOL 350 MG/ML SOLN
INTRAVENOUS | Status: DC | PRN
  Administered 2023-10-20: 40 mL

## 2023-10-20 MED ORDER — LABETALOL HCL 5 MG/ML IV SOLN: 10.0000 mg | INTRAVENOUS | Status: AC | PRN

## 2023-10-20 MED ORDER — SODIUM CHLORIDE 0.9 % IV SOLN: 250.0000 mL | INTRAVENOUS | Status: AC | PRN

## 2023-10-20 MED ORDER — FUROSEMIDE 10 MG/ML IJ SOLN
40.0000 mg | Freq: Two times a day (BID) | INTRAMUSCULAR | Status: DC
  Administered 2023-10-20 – 2023-10-24 (×8): 40 mg via INTRAVENOUS
  Filled 2023-10-20 (×10): qty 4

## 2023-10-20 MED ORDER — SODIUM CHLORIDE 0.9% FLUSH
3.0000 mL | Freq: Two times a day (BID) | INTRAVENOUS | Status: DC
  Administered 2023-10-20 – 2023-10-26 (×10): 3 mL via INTRAVENOUS

## 2023-10-20 MED ORDER — SODIUM CHLORIDE 0.9% FLUSH: 3.0000 mL | INTRAVENOUS | Status: DC | PRN

## 2023-10-20 MED ORDER — INSULIN GLARGINE-YFGN 100 UNIT/ML ~~LOC~~ SOLN
20.0000 [IU] | Freq: Two times a day (BID) | SUBCUTANEOUS | Status: DC
  Administered 2023-10-20: 20 [IU] via SUBCUTANEOUS
  Filled 2023-10-20 (×2): qty 0.2

## 2023-10-20 MED ORDER — INSULIN ASPART 100 UNIT/ML IJ SOLN
6.0000 [IU] | Freq: Three times a day (TID) | INTRAMUSCULAR | Status: DC
  Administered 2023-10-20 – 2023-10-21 (×2): 6 [IU] via SUBCUTANEOUS

## 2023-10-20 MED ORDER — INSULIN ASPART 100 UNIT/ML IJ SOLN
7.0000 [IU] | Freq: Once | INTRAMUSCULAR | Status: AC
Start: 2023-10-20 — End: 2023-10-20
  Administered 2023-10-20: 7 [IU] via SUBCUTANEOUS

## 2023-10-20 MED ORDER — HYDRALAZINE HCL 20 MG/ML IJ SOLN: 10.0000 mg | INTRAMUSCULAR | Status: AC | PRN

## 2023-10-20 MED ORDER — FENTANYL CITRATE (PF) 100 MCG/2ML IJ SOLN
INTRAMUSCULAR | Status: DC | PRN
  Administered 2023-10-20: 25 ug via INTRAVENOUS

## 2023-10-20 SURGICAL SUPPLY — 7 items
CATH INFINITI 5FR ANG PIGTAIL (CATHETERS) IMPLANT
CATH INFINITI AMBI 6FR TG (CATHETERS) IMPLANT
DEVICE RAD COMP TR BAND LRG (VASCULAR PRODUCTS) IMPLANT
GLIDESHEATH SLEND SS 6F .021 (SHEATH) IMPLANT
PACK CARDIAC CATHETERIZATION (CUSTOM PROCEDURE TRAY) ×2 IMPLANT
SET ATX-X65L (MISCELLANEOUS) IMPLANT
WIRE EMERALD 3MM-J .035X260CM (WIRE) IMPLANT

## 2023-10-20 NOTE — Plan of Care (Signed)
°  Problem: Education: °Goal: Knowledge of General Education information will improve °Description: Including pain rating scale, medication(s)/side effects and non-pharmacologic comfort measures °Outcome: Not Progressing °  °Problem: Health Behavior/Discharge Planning: °Goal: Ability to manage health-related needs will improve °Outcome: Not Progressing °  °Problem: Clinical Measurements: °Goal: Ability to maintain clinical measurements within normal limits will improve °Outcome: Not Progressing ° PATIENT IS CONFUSED °

## 2023-10-20 NOTE — Progress Notes (Signed)
 Patients family stated that with patients mental status, that they would like to be present or contacted via phone for discussion of surgical consult. Tucker Gails: [phone]

## 2023-10-20 NOTE — Plan of Care (Signed)
°  Problem: Clinical Measurements: Goal: Will remain free from infection Outcome: Progressing   Problem: Activity: Goal: Risk for activity intolerance will decrease Outcome: Progressing   Problem: Nutrition: Goal: Adequate nutrition will be maintained Outcome: Progressing

## 2023-10-20 NOTE — Progress Notes (Signed)
 [facility] CONSULT PROGRESS NOTE    Progress Note  Taylor [name]  ZHY:[id number] DOB: July 27, 1980 DOA: 10/17/2023 PCP: Theoplis Fix, MD   Brief Narrative:   Taylor [name] is an 43 y.o. female past medical history of diabetes mellitus type 2, essential hypertension, history of CVA with short-term memory problems, history of seizure and strokes comes in for [facility] for intermittent chest pain that started the day prior to admission   Assessment/Plan:   Acute respiratory failure probably with hypoxia 2/2 Pneumonia: CTA done at [facility] for elevated D-dimer showed left lower lobe and right lung base infiltrate with a elevated white blood cell count admission Continue current antibiotic regimen complete 7-day course of antibiotics.  Atypical chest pain Her cardiac biomarkers from [facility] are basically been flat in the 80s. The of her coronary showed diffuse plaque throughout LAD circumflex and RCA. Further management per cardiology.  Etiology recommended left heart cath on 10/20/2023.  Acute kidney injury: Hold Farxiga , ACE inhibitor and NSAIDs, she was just given contrast. With a baseline creatinine of 0.8, and [place] it was 1.2. Her creatinine improved to baseline.  Chronic systolic heart failure: Echo in April 2025 showed an EF of 45%. Farxiga  and ACE inhibitor were held due to acute kidney injury. Continue metoprolol   Type II diabetes mellitus (HCC) Last A1c of 7.8.  Started on prednisone  Continue long-acting insulin  plus sliding scale blood glucose elevated, she is on prednisone  for her allergy to contrast.   Increase long-acting insulin  continue sliding scale CBGs AC and at bedtime  Microcytic anemia Patient is on Eliquis, hemoglobin has remained relatively stable at 8.9, MCV is low likely iron  deficient. Ferritin of 25, iron  of 24 TIBC of 427 saturation of 6%, started on oral iron . FOBT is pending.  Depressive disorder Continue Prozac  and  trazodone .  PAD (peripheral artery disease) (HCC) Continue aspirin , Plavix  statins and beta-blockers.  Essential hypertension: ACE inhibitor  were held due to borderline blood pressure. Blood pressure is stable.    At high risk for falls PT OT has been consulted.  Seizures (HCC) Continue aspiration precaution and Keppra .   DVT prophylaxis: lovenox  Family Communication:none Status is: Observation The patient will require care spanning > 2 midnights and should be moved to inpatient because: Acute respiratory failure with hypoxia possibly due to acute community-acquired pneumonia    Code Status:     Code Status Orders  (From admission, onward)           Start     Ordered   10/17/23 1600  Full code  Continuous       Question:  By:  Answer:  Consent: discussion documented in EHR   10/17/23 1602           Code Status History     Date Active Date Inactive Code Status Order ID Comments User Context   02/04/2022 1429 02/09/2022 1653 Full Code [id number]  Ron Cobbs, PA-C Inpatient   02/01/2022 0920 02/01/2022 1836 Full Code [id number]  Margherita Shell, MD Inpatient   04/01/2020 1830 04/02/2020 0117 Full Code [id number]  Young Hensen, MD Inpatient   10/22/2019 1324 10/22/2019 2114 Full Code [id number]  Margherita Shell, MD Inpatient   06/23/2019 1852 07/05/2019 2138 Full Code [id number]  Gattis Kass, MD Inpatient   02/20/2019 1842 02/21/2019 2014 Full Code [id number]  Luellen Sages, MD Inpatient      Advance Directive Documentation    Flowsheet Row Most Recent Value  Type of  Diplomatic Services operational officer  Pre-existing out of facility DNR order (yellow form or pink MOST form) --  "MOST" Form in Place? --         IV Access:   Peripheral IV   Procedures and diagnostic studies:   CT CORONARY FRACTIONAL FLOW RESERVE FLUID ANALYSIS Result Date: 10/20/2023 EXAM: CT FFR ANALYSIS CLINICAL DATA:  abnormal imaging of coronary circulation  FINDINGS: FFRct analysis was performed on the original cardiac CT angiogram dataset. Diagrammatic representation of the FFRct analysis is provided in a separate PDF document in PACS. This dictation was created using the PDF document and an interactive 3D model of the results. 3D model is not available in the EMR/PACS. Normal FFR range is >0.80. 1. Left Main:  No significant stenosis. FFR = 1.00 2. LAD: Significant flow limitation in mid and distal vessel. Proximal FFR = 0.97, Mid FFR = 0.71, Distal FFR = <0.5 3. LCX: Significant flow limitation in distal vessel. Proximal FFR = 0.98, mid = 0.86, Distal FFR = 0.63 4. RCA: Significant flow limitation in mid and distal vessel. Proximal FFR = 0.99, Mid FFR = 0.78, Distal FFR = 0.67 IMPRESSION: 1. Severe diffuse plaque throughout proximal LAD, LCx, and RCA. This leads to downstream flow limitations in all three vessels as noted above. 2. Total plaque volume 1666 mm3 which is 100th percentile for age- and sex-matched controls (calcified plaque 546 mm3; non-calcified plaque 1120 mm3). TPV is extensive. Electronically Signed   By: Sheryle Donning M.D.   On: 10/20/2023 07:24   CT CORONARY MORPH W/CTA COR W/SCORE W/CA W/CM &/OR WO/CM Addendum Date: 10/19/2023 ADDENDUM REPORT: 10/19/2023 14:46 EXAM: OVER-READ INTERPRETATION  CT CHEST The following report is an over-read performed by radiologist Dr. Kriss Peter [facility] on 10/19/2023. This over-read does not include interpretation of cardiac or coronary anatomy or pathology. The coronary CTA interpretation by the cardiologist is attached. COMPARISON:  Chest radiograph dated 07/30/2019. FINDINGS: The visualized thoracic aorta and pulmonary arteries appear unremarkable. No hilar or mediastinal adenopathy identified. The visualized esophagus is unremarkable. The visualized lungs are clear. No acute findings in the visualized upper abdomen. No acute osseous pathology. IMPRESSION: No acute extracardiac  findings. Electronically Signed   By: Angus Bark M.D.   On: 10/19/2023 14:46   Result Date: 10/19/2023 HISTORY: Chest pain, nonspecific EXAM: Cardiac/Coronary CT TECHNIQUE: The patient was scanned on a [employer]. PROTOCOL: A 120 kV prospective scan was triggered in the descending thoracic aorta at 111 HU's. Axial non-contrast 3 mm slices were carried out through the heart. The data set was analyzed on a dedicated work station and scored using the Agatston method. Gantry rotation speed was 250 msecs and collimation was 0.6 mm. Heart rate was optimized medically and sl NTG was given. The 3D data set was reconstructed in 5% intervals of the 35-75 % of the R-R cycle. Systolic and diastolic phases were analyzed on a dedicated work station using MPR, MIP and VRT modes. The patient received 95mL OMNIPAQUE  IOHEXOL  350 MG/ML SOLN of contrast. FINDINGS: Coronary calcium  score: The patient's coronary artery calcium  score is 2837, which places the patient in the 99th percentile. Coronary arteries: Normal coronary origins.  Right dominance. Right Coronary Artery: Normal caliber vessel, gives rise to PDA. Diffuse mixed calcified and noncalcified plaque throughout vessel. Several areas of focal stenosis in proximal and mid portions of vessel of at least 50-69%, may be more severe, limited by interference from calcification. Left Main Coronary Artery: Normal caliber  vessel. Mixed calcified and noncalcified plaque with 1-24% stenosis. Left Anterior Descending Coronary Artery: Normal caliber vessel. Diffused mixed calcified and noncalcified plaque throughout vessel, most prominent in proximal and mid portions. Several areas in proximal LAD concerning for 70-99% stenosis. Mid portion with at least 50-69% stenosis, cannot excluded higher grade stenosis due to artifact from calcium . Gives rise to large first diagonal branch with calcified and noncalcified plaque, stenosis not well visualized due to calcification. Left  Circumflex Artery: Normal caliber vessel. Diffused mixed calcified and noncalcified plaque throughout vessel, most prominent in proximal and mid portions. Gives rise to 2 OM branches, both with significant mixed calcified and noncalcified plaque, stenosis not well visualized. Aorta: Normal size. Aortic atherosclerosis. No dissection seen in visualized portions of the aorta. Aortic Valve: No calcifications. Trileaflet. Other findings: Normal pulmonary vein drainage into the left atrium. Normal left atrial appendage without a thrombus. Normal size of the pulmonary artery. Normal appearance of the pericardium. Mild mitral annular calcification IMPRESSION: 1. Diffuse severe CAD, CADRADS = 4. CT FFR will be performed and reported separately. 70-99% proximal LAD, with significant diffuse stenosis in RCA of at least 50-69% and possibly more severe. Calcification causes significant limitations to determination of stenosis. 2. Coronary calcium  score of 2837. This was 99th percentile for age-, sex-, and race- matched controls. 3. Total plaque volume information pending, will be added to FFR report once complete. 4. Normal coronary origin with right dominance. Findings communicated with Dr. Lavonne Prairie. INTERPRETATION: CAD-RADS 4: Severe stenosis. (70-99% or > 50% left main). Cardiac catheterization or CT FFR is recommended. Consider symptom-guided anti-ischemic pharmacotherapy as well as risk factor modification per guideline directed care. Invasive coronary angiography recommended with revascularization per published guideline statements. Electronically Signed: By: Sheryle Donning M.D. On: 10/19/2023 14:33     Medical Consultants:   None.   Subjective:    [name] complaining of back pain shortness of breath is unchanged.  Objective:    Vitals:   10/20/23 0042 10/20/23 0535 10/20/23 0608 10/20/23 0733  BP: 110/86 (!) 143/76  104/66  Pulse: 81 83  86  Resp: 16 16  18   Temp: 98 F (36.7 C) 97.6  F (36.4 C)  97.7 F (36.5 C)  TempSrc: Oral Oral  Oral  SpO2: 95% 99%  99%  Weight:   96.8 kg   Height:       SpO2: 99 % O2 Flow Rate (L/min): 2 L/min   Intake/Output Summary (Last 24 hours) at 10/20/2023 1032 Last data filed at 10/20/2023 0800 Gross per 24 hour  Intake 240 ml  Output 700 ml  Net -460 ml   Filed Weights   10/18/23 0626 10/19/23 0545 10/20/23 1610  Weight: 95.3 kg 98.4 kg 96.8 kg    Exam: General exam: In no acute distress. Respiratory system: Good air movement and clear to auscultation. Cardiovascular system: S1 & S2 heard, RRR. No JVD. Gastrointestinal system: Abdomen is nondistended, soft and nontender.  Extremities: No pedal edema. Skin: No rashes, lesions or ulcers Psychiatry: Judgement and insight appear normal. Mood & affect appropriate.  Data Reviewed:    Labs: Basic Metabolic Panel: Recent Labs  Lab 10/17/23 1625 10/18/23 0250 10/19/23 0257 10/19/23 1642 10/20/23 0309  NA 133* 137 136 131* 136  K 5.5* 4.8 5.0 5.3* 4.7  CL 93* 97* 101 94* 101  CO2 26 29 26 25 27   GLUCOSE 422* 248* 236* 461* 254*  BUN 23* 27* 26* 26* 23*  CREATININE 1.23* 1.10* 0.96 1.37* 0.99  CALCIUM  9.6 9.9 9.0 9.0 9.8   GFR Estimated Creatinine Clearance: 83.5 mL/min (by C-G formula based on SCr of 0.99 mg/dL). Liver Function Tests: Recent Labs  Lab 10/17/23 1625  AST 36  ALT 26  ALKPHOS 76  BILITOT 0.6  PROT 7.1  ALBUMIN 3.6   No results for input(s): "LIPASE", "AMYLASE" in the last 168 hours. No results for input(s): "AMMONIA" in the last 168 hours. Coagulation profile No results for input(s): "INR", "PROTIME" in the last 168 hours. COVID-19 Labs  Recent Labs    10/18/23 0827  FERRITIN 25    Lab Results  Component Value Date   SARSCOV2NAA NEGATIVE 03/31/2020   SARSCOV2NAA NEGATIVE 10/18/2019   SARSCOV2NAA Not Detected 05/14/2019   SARSCOV2NAA NEGATIVE 03/21/2019    CBC: Recent Labs  Lab 10/17/23 1625 10/18/23 0250 10/19/23 0257  10/20/23 0309  WBC 19.5* 16.1* 11.2* 18.4*  HGB 9.6* 8.9* 8.7* 9.1*  HCT 32.5* 30.5* 30.4* 31.6*  MCV 68.3* 68.8* 69.2* 69.0*  PLT 262 246 215 252   Cardiac Enzymes: No results for input(s): "CKTOTAL", "CKMB", "CKMBINDEX", "TROPONINI" in the last 168 hours. BNP (last 3 results) No results for input(s): "PROBNP" in the last 8760 hours. CBG: Recent Labs  Lab 10/19/23 1611 10/19/23 1802 10/19/23 1836 10/19/23 1958 10/20/23 0606  GLUCAP 460* 499* 498* 390* 262*   D-Dimer: No results for input(s): "DDIMER" in the last 72 hours. Hgb A1c: Recent Labs    10/17/23 1548  HGBA1C 8.0*   Lipid Profile: Recent Labs    10/18/23 0250  CHOL 136  HDL 59  LDLCALC 47  TRIG 150*  CHOLHDL 2.3   Thyroid function studies: Recent Labs    10/17/23 1625  TSH 0.725   Anemia work up: Recent Labs    10/18/23 0827  VITAMINB12 643  FOLATE 9.2  FERRITIN 25  TIBC 427  IRON  24*  RETICCTPCT 1.2   Sepsis Labs: Recent Labs  Lab 10/17/23 1625 10/18/23 0250 10/19/23 0257 10/20/23 0309  WBC 19.5* 16.1* 11.2* 18.4*   Microbiology Recent Results (from the past 240 hours)  Respiratory (~20 pathogens) panel by PCR     Status: None   Collection Time: 10/17/23  7:55 PM   Specimen: Nasopharyngeal Swab; Respiratory  Result Value Ref Range Status   Adenovirus NOT DETECTED NOT DETECTED Final   Coronavirus 229E NOT DETECTED NOT DETECTED Final    Comment: (NOTE) The Coronavirus on the Respiratory Panel, DOES NOT test for the novel  Coronavirus (2019 nCoV)    Coronavirus HKU1 NOT DETECTED NOT DETECTED Final   Coronavirus NL63 NOT DETECTED NOT DETECTED Final   Coronavirus OC43 NOT DETECTED NOT DETECTED Final   Metapneumovirus NOT DETECTED NOT DETECTED Final   Rhinovirus / Enterovirus NOT DETECTED NOT DETECTED Final   Influenza A NOT DETECTED NOT DETECTED Final   Influenza B NOT DETECTED NOT DETECTED Final   Parainfluenza Virus 1 NOT DETECTED NOT DETECTED Final   Parainfluenza Virus 2  NOT DETECTED NOT DETECTED Final   Parainfluenza Virus 3 NOT DETECTED NOT DETECTED Final   Parainfluenza Virus 4 NOT DETECTED NOT DETECTED Final   Respiratory Syncytial Virus NOT DETECTED NOT DETECTED Final   Bordetella pertussis NOT DETECTED NOT DETECTED Final   Bordetella Parapertussis NOT DETECTED NOT DETECTED Final   Chlamydophila pneumoniae NOT DETECTED NOT DETECTED Final   Mycoplasma pneumoniae NOT DETECTED NOT DETECTED Final    Comment: Performed at [facility], [address] [place], [place] [address]  MRSA Next Gen  by PCR, Nasal     Status: None   Collection Time: 10/17/23  7:55 PM   Specimen: Nasal Mucosa; Nasal Swab  Result Value Ref Range Status   MRSA by PCR Next Gen NOT DETECTED NOT DETECTED Final    Comment: (NOTE) The GeneXpert MRSA Assay (FDA approved for NASAL specimens only), is one component of a comprehensive MRSA colonization surveillance program. It is not intended to diagnose MRSA infection nor to guide or monitor treatment for MRSA infections. Test performance is not FDA approved in patients less than 34 years old. Performed at [facility], [address] [place], [place] [address]      Medications:    aspirin  EC  81 mg Oral Daily   atorvastatin   80 mg Oral Daily   azithromycin   500 mg Oral Daily   clopidogrel   75 mg Oral Daily   diazepam   5 mg Oral BID   diphenhydrAMINE   50 mg Oral Once   ferrous gluconate   324 mg Oral Daily   FLUoxetine   10 mg Oral Daily   heparin   5,000 Units Subcutaneous Q8H   insulin  aspart  0-20 Units Subcutaneous TID WC   insulin  aspart  0-5 Units Subcutaneous QHS   insulin  aspart  4 Units Subcutaneous TID WC   insulin  glargine-yfgn  10 Units Subcutaneous BID   levETIRAcetam   1,000 mg Oral Daily   magnesium  oxide  400 mg Oral Daily   methocarbamol   500 mg Oral BID   metoprolol  succinate  50 mg Oral Daily   pantoprazole   40 mg Oral Daily   predniSONE   50 mg Oral Q6H   pregabalin   150 mg Oral TID    risperiDONE   0.5 mg Oral QHS   traMADol   50 mg Oral QID   traZODone   200 mg Oral QHS   Continuous Infusions:  sodium chloride  10 mL/hr at 10/20/23 0556   cefTRIAXone  (ROCEPHIN )  IV 1 g (10/19/23 1009)      LOS: 3 days   Macdonald Savoy  [facility]  10/20/2023, 10:32 AM

## 2023-10-20 NOTE — Progress Notes (Signed)
  Echocardiogram 2D Echocardiogram has been performed.  Taylor [name] 10/20/2023, 4:42 PM

## 2023-10-20 NOTE — Progress Notes (Signed)
 TR band removed with no complications, transparent dressing placed

## 2023-10-20 NOTE — Progress Notes (Signed)
 PHARMACY - ANTICOAGULATION CONSULT NOTE  Pharmacy Consult for heparin  Indication: chest pain/ACS  Allergies  Allergen Reactions   Amitriptyline Other (See Comments)    Unresponsive    Contrast Media [Iodinated Contrast Media] Other (See Comments)    2013-had CVA, went into AFR-combination ABT, contrast, low BP   Morphine  And Codeine Other (See Comments)    Chest pain   Promethazine  Swelling    Lips swell   Compazine Other (See Comments)    -Alert but unable to move   Prochlorperazine Maleate Other (See Comments)    -Alert, but unable to move   Adhesive [Tape] Rash    Long term exposure causes skin to tear    Oxycodone  Nausea And Vomiting    Severe n/v -  patient will not take oxycodone  or percocet.   Sumatriptan Rash    Patient Measurements: Height: 5\' 5"  (165.1 cm) Weight: 96.8 kg (213 lb 6.5 oz) IBW/kg (Calculated) : 57 HEPARIN  DW (KG): 78.4  Vital Signs: Temp: 98.3 F (36.8 C) (05/09 1647) Temp Source: Axillary (05/09 1647) BP: 119/63 (05/09 1647) Pulse Rate: 79 (05/09 1647)  Labs: Recent Labs    10/18/23 0250 10/19/23 0257 10/19/23 1642 10/20/23 0309  HGB 8.9* 8.7*  --  9.1*  HCT 30.5* 30.4*  --  31.6*  PLT 246 215  --  252  CREATININE 1.10* 0.96 1.37* 0.99    Estimated Creatinine Clearance: 83.5 mL/min (by C-G formula based on SCr of 0.99 mg/dL).   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   due to elevated blood sugar 1700-off insulin  6 months   Depression    Diabetes mellitus    off insulin  6 months   Diverticulitis    Dyspnea    when walking   GERD (gastroesophageal reflux disease)    Hypertension    Kidney dialysis status    on dialysis(stroke) off after 4-6 weeks   Pneumonia 2013, 06/2018   Respiratory failure (HCC) 04/2012   with stroke - in hosp [facility] 12 weeks   Seizures (HCC)     during elvated blood sugsr episode 11/13   Sepsis (HCC)    Stroke (HCC)    11/2018, 12/2018 Weak left side, speech- slurred, Short term memeroy, Gait unsteady    Vocal cord dysfunction     Assessment: 43 yo W with multivessel CAD pending CT Surgery evaluation. No anticoagulation prior to admission. Pharmacy consulted for heparin .    Received 10,000 units heparin  in Cath, last 15:30. TR band off at 18:50.   Goal of Therapy:  Heparin  level 0.3-0.7 units/ml Monitor platelets by anticoagulation protocol: Yes   Plan:  Start Heparin  1100 units/hr at 2100, no bolus Monitor daily heparin  level, CBC, signs/symptoms of bleeding   Dorene Gang, PharmD, BCPS, [facility] Clinical Pharmacist  Please check AMION for all [facility] Pharmacy phone numbers After 10:00 PM, call Main Pharmacy [phone]

## 2023-10-20 NOTE — Plan of Care (Signed)
  Problem: Education: Goal: Knowledge of General Education information will improve Description: Including pain rating scale, medication(s)/side effects and non-pharmacologic comfort measures Outcome: Progressing   Problem: Clinical Measurements: Goal: Ability to maintain clinical measurements within normal limits will improve Outcome: Progressing Goal: Will remain free from infection Outcome: Progressing Goal: Diagnostic test results will improve Outcome: Progressing Goal: Respiratory complications will improve Outcome: Progressing Goal: Cardiovascular complication will be avoided Outcome: Progressing   Problem: Activity: Goal: Risk for activity intolerance will decrease Outcome: Progressing   Problem: Elimination: Goal: Will not experience complications related to bowel motility Outcome: Progressing Goal: Will not experience complications related to urinary retention Outcome: Progressing   Problem: Pain Managment: Goal: General experience of comfort will improve and/or be controlled Outcome: Progressing   Problem: Safety: Goal: Ability to remain free from injury will improve Outcome: Progressing   Problem: Skin Integrity: Goal: Risk for impaired skin integrity will decrease Outcome: Progressing   Problem: Health Behavior/Discharge Planning: Goal: Ability to identify and utilize available resources and services will improve Outcome: Progressing Goal: Ability to manage health-related needs will improve Outcome: Progressing   Problem: Skin Integrity: Goal: Risk for impaired skin integrity will decrease Outcome: Progressing   Problem: Tissue Perfusion: Goal: Adequacy of tissue perfusion will improve Outcome: Progressing   Problem: Cardiac: Goal: Ability to achieve and maintain adequate cardiovascular perfusion will improve Outcome: Progressing   Problem: Activity: Goal: Ability to return to baseline activity level will improve Outcome: Progressing   Problem:  Cardiovascular: Goal: Ability to achieve and maintain adequate cardiovascular perfusion will improve Outcome: Progressing Goal: Vascular access site(s) Level 0-1 will be maintained Outcome: Progressing

## 2023-10-20 NOTE — Interval H&P Note (Signed)
 History and Physical Interval Note:  10/20/2023 6:55 AM  Taylor [name]  has presented today for surgery, with the diagnosis of chest pain.  The various methods of treatment have been discussed with the patient and family. After consideration of risks, benefits and other options for treatment, the patient has consented to  Procedure(s): LEFT HEART CATH AND CORONARY ANGIOGRAPHY (N/A) as a surgical intervention.  The patient's history has been reviewed, patient examined, no change in status, stable for surgery.  I have reviewed the patient's chart and labs.  Questions were answered to the patient's satisfaction.     [name] K [name]

## 2023-10-20 NOTE — Progress Notes (Signed)
 Progress Note  Patient Name: Taylor [name] Date of Encounter: 10/20/2023  Primary Cardiologist:   [name], MD   Subjective   She complains of back pain and has a "little chest pain" in a focal left upper spot.  Breathing is better.  Only mild cough  Inpatient Medications    Scheduled Meds:  aspirin  EC  81 mg Oral Daily   atorvastatin   80 mg Oral Daily   azithromycin   500 mg Oral Daily   clopidogrel   75 mg Oral Daily   diazepam   5 mg Oral BID   diphenhydrAMINE   50 mg Oral Once   ferrous gluconate   324 mg Oral Daily   FLUoxetine   10 mg Oral Daily   heparin   5,000 Units Subcutaneous Q8H   insulin  aspart  0-20 Units Subcutaneous TID WC   insulin  aspart  0-5 Units Subcutaneous QHS   insulin  aspart  4 Units Subcutaneous TID WC   insulin  glargine-yfgn  10 Units Subcutaneous BID   levETIRAcetam   1,000 mg Oral Daily   magnesium  oxide  400 mg Oral Daily   methocarbamol   500 mg Oral BID   metoprolol  succinate  50 mg Oral Daily   pantoprazole   40 mg Oral Daily   predniSONE   50 mg Oral Q6H   pregabalin   150 mg Oral TID   risperiDONE   0.5 mg Oral QHS   traMADol   50 mg Oral QID   traZODone   200 mg Oral QHS   Continuous Infusions:  sodium chloride  10 mL/hr at 10/20/23 0556   cefTRIAXone  (ROCEPHIN )  IV 1 g (10/19/23 1009)   PRN Meds: acetaminophen , albuterol , HYDROcodone -acetaminophen , morphine  injection, nitroGLYCERIN , ondansetron  (ZOFRAN ) IV, tiZANidine    Vital Signs    Vitals:   10/19/23 2115 10/20/23 0042 10/20/23 0535 10/20/23 0608  BP:  110/86 (!) 143/76   Pulse: 83 81 83   Resp: 19 16 16    Temp:  98 F (36.7 C) 97.6 F (36.4 C)   TempSrc:  Oral Oral   SpO2: 98% 95% 99%   Weight:    96.8 kg  Height:        Intake/Output Summary (Last 24 hours) at 10/20/2023 0722 Last data filed at 10/20/2023 0556 Gross per 24 hour  Intake 241 ml  Output 1300 ml  Net -1059 ml   Filed Weights   10/18/23 0626 10/19/23 0545 10/20/23 1610  Weight: 95.3 kg 98.4 kg 96.8 kg     Telemetry    NSR - Personally Reviewed  ECG    NA - Personally Reviewed  Physical Exam   GEN: No acute distress.   Neck: No  JVD Cardiac: RRR, no murmurs, rubs, or gallops.  Respiratory: Clear  to auscultation bilaterally. GI: Soft, nontender, non-distended  MS: No  edema; No deformity. Neuro:  Nonfocal  Psych: Normal affect   Labs    Chemistry Recent Labs  Lab 10/17/23 1625 10/18/23 0250 10/19/23 0257 10/19/23 1642 10/20/23 0309  NA 133*   < > 136 131* 136  K 5.5*   < > 5.0 5.3* 4.7  CL 93*   < > 101 94* 101  CO2 26   < > 26 25 27   GLUCOSE 422*   < > 236* 461* 254*  BUN 23*   < > 26* 26* 23*  CREATININE 1.23*   < > 0.96 1.37* 0.99  CALCIUM  9.6   < > 9.0 9.0 9.8  PROT 7.1  --   --   --   --  ALBUMIN 3.6  --   --   --   --   AST 36  --   --   --   --   ALT 26  --   --   --   --   ALKPHOS 76  --   --   --   --   BILITOT 0.6  --   --   --   --   GFRNONAA 56*   < > >60 49* >60  ANIONGAP 14   < > 9 12 8    < > = values in this interval not displayed.     Hematology Recent Labs  Lab 10/18/23 0250 10/18/23 0827 10/19/23 0257 10/20/23 0309  WBC 16.1*  --  11.2* 18.4*  RBC 4.43 4.38 4.39 4.58  HGB 8.9*  --  8.7* 9.1*  HCT 30.5*  --  30.4* 31.6*  MCV 68.8*  --  69.2* 69.0*  MCH 20.1*  --  19.8* 19.9*  MCHC 29.2*  --  28.6* 28.8*  RDW 18.4*  --  18.4* 18.1*  PLT 246  --  215 252    Cardiac EnzymesNo results for input(s): "TROPONINI" in the last 168 hours. No results for input(s): "TROPIPOC" in the last 168 hours.   BNP Recent Labs  Lab 10/17/23 1625  BNP 353.2*     DDimer No results for input(s): "DDIMER" in the last 168 hours.   Radiology    CT CORONARY MORPH W/CTA COR W/SCORE W/CA W/CM &/OR WO/CM Addendum Date: 10/19/2023 ADDENDUM REPORT: 10/19/2023 14:46 EXAM: OVER-READ INTERPRETATION  CT CHEST The following report is an over-read performed by radiologist Dr. Kriss Peter [facility] on 10/19/2023. This over-read does not  include interpretation of cardiac or coronary anatomy or pathology. The coronary CTA interpretation by the cardiologist is attached. COMPARISON:  Chest radiograph dated 07/30/2019. FINDINGS: The visualized thoracic aorta and pulmonary arteries appear unremarkable. No hilar or mediastinal adenopathy identified. The visualized esophagus is unremarkable. The visualized lungs are clear. No acute findings in the visualized upper abdomen. No acute osseous pathology. IMPRESSION: No acute extracardiac findings. Electronically Signed   By: Angus Bark M.D.   On: 10/19/2023 14:46   Result Date: 10/19/2023 HISTORY: Chest pain, nonspecific EXAM: Cardiac/Coronary CT TECHNIQUE: The patient was scanned on a [employer]. PROTOCOL: A 120 kV prospective scan was triggered in the descending thoracic aorta at 111 HU's. Axial non-contrast 3 mm slices were carried out through the heart. The data set was analyzed on a dedicated work station and scored using the Agatston method. Gantry rotation speed was 250 msecs and collimation was 0.6 mm. Heart rate was optimized medically and sl NTG was given. The 3D data set was reconstructed in 5% intervals of the 35-75 % of the R-R cycle. Systolic and diastolic phases were analyzed on a dedicated work station using MPR, MIP and VRT modes. The patient received 95mL OMNIPAQUE  IOHEXOL  350 MG/ML SOLN of contrast. FINDINGS: Coronary calcium  score: The patient's coronary artery calcium  score is 2837, which places the patient in the 99th percentile. Coronary arteries: Normal coronary origins.  Right dominance. Right Coronary Artery: Normal caliber vessel, gives rise to PDA. Diffuse mixed calcified and noncalcified plaque throughout vessel. Several areas of focal stenosis in proximal and mid portions of vessel of at least 50-69%, may be more severe, limited by interference from calcification. Left Main Coronary Artery: Normal caliber vessel. Mixed calcified and noncalcified plaque with 1-24%  stenosis. Left Anterior Descending Coronary Artery: Normal caliber  vessel. Diffused mixed calcified and noncalcified plaque throughout vessel, most prominent in proximal and mid portions. Several areas in proximal LAD concerning for 70-99% stenosis. Mid portion with at least 50-69% stenosis, cannot excluded higher grade stenosis due to artifact from calcium . Gives rise to large first diagonal branch with calcified and noncalcified plaque, stenosis not well visualized due to calcification. Left Circumflex Artery: Normal caliber vessel. Diffused mixed calcified and noncalcified plaque throughout vessel, most prominent in proximal and mid portions. Gives rise to 2 OM branches, both with significant mixed calcified and noncalcified plaque, stenosis not well visualized. Aorta: Normal size. Aortic atherosclerosis. No dissection seen in visualized portions of the aorta. Aortic Valve: No calcifications. Trileaflet. Other findings: Normal pulmonary vein drainage into the left atrium. Normal left atrial appendage without a thrombus. Normal size of the pulmonary artery. Normal appearance of the pericardium. Mild mitral annular calcification IMPRESSION: 1. Diffuse severe CAD, CADRADS = 4. CT FFR will be performed and reported separately. 70-99% proximal LAD, with significant diffuse stenosis in RCA of at least 50-69% and possibly more severe. Calcification causes significant limitations to determination of stenosis. 2. Coronary calcium  score of 2837. This was 99th percentile for age-, sex-, and race- matched controls. 3. Total plaque volume information pending, will be added to FFR report once complete. 4. Normal coronary origin with right dominance. Findings communicated with Dr. Lavonne Prairie. INTERPRETATION: CAD-RADS 4: Severe stenosis. (70-99% or > 50% left main). Cardiac catheterization or CT FFR is recommended. Consider symptom-guided anti-ischemic pharmacotherapy as well as risk factor modification per guideline directed  care. Invasive coronary angiography recommended with revascularization per published guideline statements. Electronically Signed: By: Sheryle Donning M.D. On: 10/19/2023 14:33    Cardiac Studies   CT:  See above.   Patient Profile     43 y.o. female Taylor [name] is a 43 y.o. 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   Admitted for atypical chest pain elevated troponins.  Assessment & Plan    Chest pain:  Abnormal CT as above.  Plan is for cardiac cath today.     Heart failure with mildly reduced EF:  EF is mildly reduced at 45 - 50%  Farxiga , lisiopril and spironolactone held secondary to AKI.  Resume after cath over the weekend pending renal function and BP.     Hyperlipidemia:  Continue statin.  LDL at target.   Diabetes:  Per primary team and hospital service.    Pneumonia  Day 3 of  antibiotics per primary team.   History of CVAs:  Memory loss.  Parents are aware of cath.  Chronically on ASA and Plavix  per neuro.  PAD:  Status post left femoral popliteal artery bypass 02/04/2022 with Dr. Charlotte Cookey. Repeat arterial duplex 10/03/2022 with patent graft.  Continue meds as risk reduction.   Anxiety/Pain management:  Per Hospital consultant.     For questions or updates, please contact [facility] Please consult [email or web address] for contact info under Cardiology/STEMI.   Signed, [name], MD  10/20/2023, 7:22 AM

## 2023-10-21 ENCOUNTER — Encounter (HOSPITAL_COMMUNITY): Payer: Self-pay | Admitting: Internal Medicine

## 2023-10-21 DIAGNOSIS — I25119 Atherosclerotic heart disease of native coronary artery with unspecified angina pectoris: Secondary | ICD-10-CM

## 2023-10-21 DIAGNOSIS — I739 Peripheral vascular disease, unspecified: Secondary | ICD-10-CM

## 2023-10-21 DIAGNOSIS — I2510 Atherosclerotic heart disease of native coronary artery without angina pectoris: Secondary | ICD-10-CM

## 2023-10-21 LAB — BASIC METABOLIC PANEL WITH GFR
Anion gap: 10 (ref 5–15)
Anion gap: 11 (ref 5–15)
BUN: 30 mg/dL — ABNORMAL HIGH (ref 6–20)
BUN: 27 mg/dL — ABNORMAL HIGH (ref 6–20)
CO2: 27 mmol/L (ref 22–32)
CO2: 27 mmol/L (ref 22–32)
Calcium: 9.3 mg/dL (ref 8.9–10.3)
Calcium: 9.1 mg/dL (ref 8.9–10.3)
Chloride: 96 mmol/L — ABNORMAL LOW (ref 98–111)
Chloride: 98 mmol/L (ref 98–111)
Creatinine, Ser: 1.28 mg/dL — ABNORMAL HIGH (ref 0.44–1.00)
Creatinine, Ser: 0.91 mg/dL (ref 0.44–1.00)
GFR, Estimated: 53 mL/min — ABNORMAL LOW (ref >60)
GFR, Estimated: >60 mL/min (ref >60)
Glucose, Bld: 519 mg/dL (ref 70–99)
Glucose, Bld: 285 mg/dL — ABNORMAL HIGH (ref 70–99)
Potassium: 4.8 mmol/L (ref 3.5–5.1)
Potassium: 3.8 mmol/L (ref 3.5–5.1)
Sodium: 133 mmol/L — ABNORMAL LOW (ref 135–145)
Sodium: 136 mmol/L (ref 135–145)

## 2023-10-21 LAB — CBC
HCT: 30.8 % — ABNORMAL LOW (ref 36.0–46.0)
Hemoglobin: 8.9 g/dL — ABNORMAL LOW (ref 12.0–15.0)
MCH: 20 pg — ABNORMAL LOW (ref 26.0–34.0)
MCHC: 28.9 g/dL — ABNORMAL LOW (ref 30.0–36.0)
MCV: 69.2 fL — ABNORMAL LOW (ref 80.0–100.0)
Platelets: 212 10*3/uL (ref 150–400)
RBC: 4.45 MIL/uL (ref 3.87–5.11)
RDW: 18.5 % — ABNORMAL HIGH (ref 11.5–15.5)
WBC: 16.5 10*3/uL — ABNORMAL HIGH (ref 4.0–10.5)
nRBC: 0 % (ref 0.0–0.2)

## 2023-10-21 LAB — GLUCOSE, CAPILLARY
Glucose-Capillary: 121 mg/dL — ABNORMAL HIGH (ref 70–99)
Glucose-Capillary: 158 mg/dL — ABNORMAL HIGH (ref 70–99)
Glucose-Capillary: 238 mg/dL — ABNORMAL HIGH (ref 70–99)
Glucose-Capillary: 269 mg/dL — ABNORMAL HIGH (ref 70–99)
Glucose-Capillary: 344 mg/dL — ABNORMAL HIGH (ref 70–99)
Glucose-Capillary: 392 mg/dL — ABNORMAL HIGH (ref 70–99)

## 2023-10-21 LAB — HEPARIN LEVEL (UNFRACTIONATED): Heparin Unfractionated: 0.63 [IU]/mL (ref 0.30–0.70)

## 2023-10-21 MED ORDER — AMOXICILLIN-POT CLAVULANATE 875-125 MG PO TABS
1.0000 | ORAL_TABLET | Freq: Two times a day (BID) | ORAL | Status: DC
Start: 2023-10-21 — End: 2023-10-21

## 2023-10-21 MED ORDER — INSULIN GLARGINE-YFGN 100 UNIT/ML ~~LOC~~ SOLN
20.0000 [IU] | Freq: Two times a day (BID) | SUBCUTANEOUS | Status: DC
  Administered 2023-10-21 – 2023-10-22 (×3): 20 [IU] via SUBCUTANEOUS
  Filled 2023-10-21 (×5): qty 0.2

## 2023-10-21 MED ORDER — AMOXICILLIN-POT CLAVULANATE 875-125 MG PO TABS
1.0000 | ORAL_TABLET | Freq: Two times a day (BID) | ORAL | Status: AC
Start: 2023-10-21 — End: 2023-10-23
  Administered 2023-10-21 – 2023-10-22 (×4): 1 via ORAL
  Filled 2023-10-21 (×4): qty 1

## 2023-10-21 MED ORDER — INSULIN GLARGINE-YFGN 100 UNIT/ML ~~LOC~~ SOLN: 25.0000 [IU] | Freq: Two times a day (BID) | SUBCUTANEOUS | Status: DC

## 2023-10-21 MED ORDER — INSULIN ASPART 100 UNIT/ML IJ SOLN
8.0000 [IU] | Freq: Three times a day (TID) | INTRAMUSCULAR | Status: DC
  Administered 2023-10-21 – 2023-10-26 (×14): 8 [IU] via SUBCUTANEOUS

## 2023-10-21 NOTE — Progress Notes (Signed)
 Progress Note  Patient Name: Taylor [name] Date of Encounter: 10/21/2023  Primary Cardiologist: [name], MD   Subjective   Patient seen and examined at her bedside.  Inpatient Medications    Scheduled Meds:  aspirin  EC  81 mg Oral Daily   atorvastatin   80 mg Oral Daily   azithromycin   500 mg Oral Daily   diazepam   5 mg Oral BID   ferrous gluconate   324 mg Oral Daily   FLUoxetine   10 mg Oral Daily   furosemide  40 mg Intravenous BID   insulin  aspart  0-20 Units Subcutaneous TID WC   insulin  aspart  0-5 Units Subcutaneous QHS   insulin  aspart  6 Units Subcutaneous TID WC   insulin  glargine-yfgn  15 Units Subcutaneous BID   levETIRAcetam   1,000 mg Oral Daily   magnesium  oxide  400 mg Oral Daily   methocarbamol   500 mg Oral BID   metoprolol  succinate  50 mg Oral Daily   pantoprazole   40 mg Oral Daily   pregabalin   150 mg Oral TID   risperiDONE   0.5 mg Oral QHS   sodium chloride  flush  3 mL Intravenous Q12H   traMADol   50 mg Oral QID   traZODone   200 mg Oral QHS   Continuous Infusions:  sodium chloride      cefTRIAXone  (ROCEPHIN )  IV 1 g (10/20/23 1123)   heparin  1,100 Units/hr (10/21/23 0546)   PRN Meds: sodium chloride , acetaminophen , albuterol , HYDROcodone -acetaminophen , morphine  injection, nitroGLYCERIN , ondansetron  (ZOFRAN ) IV, sodium chloride  flush, tiZANidine    Vital Signs    Vitals:   10/20/23 1933 10/20/23 2343 10/21/23 0300 10/21/23 0724  BP: (!) 110/58 (!) 121/56 121/70 122/74  Pulse: 83 85 78 82  Resp: 18 18 16 16   Temp: 97.8 F (36.6 C) 98.3 F (36.8 C) 98 F (36.7 C) 98.5 F (36.9 C)  TempSrc: Oral Oral Oral Oral  SpO2: 98% 94% 97% 92%  Weight:   97.3 kg   Height:        Intake/Output Summary (Last 24 hours) at 10/21/2023 0852 Last data filed at 10/21/2023 0640 Gross per 24 hour  Intake 1415.77 ml  Output 2475 ml  Net -1059.23 ml   Filed Weights   10/19/23 0545 10/20/23 0608 10/21/23 0300  Weight: 98.4 kg 96.8 kg 97.3 kg     Telemetry    Sinus rhythm- Personally Reviewed  ECG     - Personally Reviewed  Physical Exam    General: Comfortable Head: Atraumatic, normal size  Eyes: PEERLA, EOMI  Neck: Supple, normal JVD Cardiac: Normal S1, S2; RRR; no murmurs, rubs, or gallops Lungs: Clear to auscultation bilaterally Abd: Soft, nontender, no hepatomegaly  Ext: warm, no edema Musculoskeletal: No deformities, BUE and BLE strength normal and equal Skin: Warm and dry, no rashes   Neuro: Alert and oriented to person, place, time, and situation, CNII-XII grossly intact, no focal deficits  Psych: Normal mood and affect   Labs    Chemistry Recent Labs  Lab 10/17/23 1625 10/18/23 0250 10/19/23 1642 10/20/23 0309 10/20/23 2325  NA 133*   < > 131* 136 133*  K 5.5*   < > 5.3* 4.7 4.8  CL 93*   < > 94* 101 96*  CO2 26   < > 25 27 27   GLUCOSE 422*   < > 461* 254* 519*  BUN 23*   < > 26* 23* 30*  CREATININE 1.23*   < > 1.37* 0.99 1.28*  CALCIUM  9.6   < >  9.0 9.8 9.3  PROT 7.1  --   --   --   --   ALBUMIN 3.6  --   --   --   --   AST 36  --   --   --   --   ALT 26  --   --   --   --   ALKPHOS 76  --   --   --   --   BILITOT 0.6  --   --   --   --   GFRNONAA 56*   < > 49* >60 53*  ANIONGAP 14   < > 12 8 10    < > = values in this interval not displayed.     Hematology Recent Labs  Lab 10/18/23 0250 10/18/23 0827 10/19/23 0257 10/20/23 0309  WBC 16.1*  --  11.2* 18.4*  RBC 4.43 4.38 4.39 4.58  HGB 8.9*  --  8.7* 9.1*  HCT 30.5*  --  30.4* 31.6*  MCV 68.8*  --  69.2* 69.0*  MCH 20.1*  --  19.8* 19.9*  MCHC 29.2*  --  28.6* 28.8*  RDW 18.4*  --  18.4* 18.1*  PLT 246  --  215 252    Cardiac EnzymesNo results for input(s): "TROPONINI" in the last 168 hours. No results for input(s): "TROPIPOC" in the last 168 hours.   BNP Recent Labs  Lab 10/17/23 1625  BNP 353.2*     DDimer No results for input(s): "DDIMER" in the last 168 hours.   Radiology    ECHOCARDIOGRAM COMPLETE Result  Date: 10/20/2023    ECHOCARDIOGRAM REPORT   Patient Name:   Taylor [name] Date of Exam: 10/20/2023 Medical Rec #:  [id number]     Height:       65.0 in Accession #:    [id number]    Weight:       213.4 lb Date of Birth:  04-07-1981      BSA:          2.033 m Patient Age:    43 years      BP:           130/79 mmHg Patient Gender: F             HR:           74 bpm. Exam Location:  Inpatient Procedure: 2D Echo (Both Spectral and Color Flow Doppler were utilized during            procedure). Indications:    CAD of native vessel  History:        Patient has no prior history of Echocardiogram examinations.                 CAD, PAD, Signs/Symptoms:Chest Pain; Risk Factors:Hypertension.  Sonographer:    Dione Franks RDCS Referring Phys: [id number] STEVEN C HENDRICKSON IMPRESSIONS  1. Septal , mid/basal inferior wall and lateral wall hypokinesis . Left ventricular ejection fraction, by estimation, is 35 to 40%. The left ventricle has moderately decreased function. The left ventricle demonstrates regional wall motion abnormalities (see scoring diagram/findings for description). The left ventricular internal cavity size was mildly dilated. Left ventricular diastolic parameters are consistent with Grade II diastolic dysfunction (pseudonormalization). Elevated left ventricular end-diastolic pressure.  2. Right ventricular systolic function is normal. The right ventricular size is normal.  3. Left atrial size was moderately dilated.  4. Moderate subvalvular calcification of the posterior leaflet chords . The mitral valve is abnormal. Trivial  mitral valve regurgitation. No evidence of mitral stenosis.  5. The aortic valve is tricuspid. Aortic valve regurgitation is not visualized. No aortic stenosis is present.  6. The inferior vena cava is normal in size with greater than 50% respiratory variability, suggesting right atrial pressure of 3 mmHg. FINDINGS  Left Ventricle: Septal , mid/basal inferior wall and lateral wall hypokinesis.  Left ventricular ejection fraction, by estimation, is 35 to 40%. The left ventricle has moderately decreased function. The left ventricle demonstrates regional wall motion abnormalities. Strain was performed and the global longitudinal strain is indeterminate. The left ventricular internal cavity size was mildly dilated. There is no left ventricular hypertrophy. Left ventricular diastolic parameters are consistent with Grade II diastolic dysfunction (pseudonormalization). Elevated left ventricular end-diastolic pressure. Right Ventricle: The right ventricular size is normal. No increase in right ventricular wall thickness. Right ventricular systolic function is normal. Left Atrium: Left atrial size was moderately dilated. Right Atrium: Right atrial size was normal in size. Pericardium: Trivial pericardial effusion is present. The pericardial effusion is posterior to the left ventricle. Mitral Valve: Moderate subvalvular calcification of the posterior leaflet chords. The mitral valve is abnormal. There is mild thickening of the mitral valve leaflet(s). There is mild calcification of the mitral valve leaflet(s). Mild mitral annular calcification. Trivial mitral valve regurgitation. No evidence of mitral valve stenosis. Tricuspid Valve: The tricuspid valve is normal in structure. Tricuspid valve regurgitation is trivial. No evidence of tricuspid stenosis. Aortic Valve: The aortic valve is tricuspid. Aortic valve regurgitation is not visualized. No aortic stenosis is present. Pulmonic Valve: The pulmonic valve was normal in structure. Pulmonic valve regurgitation is not visualized. No evidence of pulmonic stenosis. Aorta: The aortic root is normal in size and structure. Venous: The inferior vena cava is normal in size with greater than 50% respiratory variability, suggesting right atrial pressure of 3 mmHg. IAS/Shunts: No atrial level shunt detected by color flow Doppler. Additional Comments: 3D was performed not  requiring image post processing on an independent workstation and was indeterminate.  LEFT VENTRICLE PLAX 2D LVIDd:         5.60 cm     Diastology LVIDs:         4.50 cm     LV e' medial:    5.77 cm/s LV PW:         1.10 cm     LV E/e' medial:  17.2 LV IVS:        1.00 cm     LV e' lateral:   6.09 cm/s LVOT diam:     2.20 cm     LV E/e' lateral: 16.3 LV SV:         65 LV SV Index:   32 LVOT Area:     3.80 cm  LV Volumes (MOD) LV vol d, MOD A4C: 98.2 ml LV vol s, MOD A4C: 63.8 ml LV SV MOD A4C:     98.2 ml RIGHT VENTRICLE             IVC RV Basal diam:  2.50 cm     IVC diam: 1.70 cm RV S prime:     13.80 cm/s TAPSE (M-mode): 2.4 cm LEFT ATRIUM             Index        RIGHT ATRIUM           Index LA diam:        5.00 cm 2.46 cm/m   RA Area:  12.10 cm LA Vol (A2C):   43.9 ml 21.59 ml/m  RA Volume:   26.50 ml  13.03 ml/m LA Vol (A4C):   88.4 ml 43.47 ml/m LA Biplane Vol: 66.0 ml 32.46 ml/m  AORTIC VALVE LVOT Vmax:   78.50 cm/s LVOT Vmean:  52.800 cm/s LVOT VTI:    0.170 m  AORTA Ao Root diam: 2.90 cm Ao Asc diam:  2.70 cm MITRAL VALVE MV Area (PHT): 4.39 cm    SHUNTS MV Decel Time: 173 msec    Systemic VTI:  0.17 m MV E velocity: 99.40 cm/s  Systemic Diam: 2.20 cm MV A velocity: 66.30 cm/s MV E/A ratio:  1.50 Janelle Mediate MD Electronically signed by Janelle Mediate MD Signature Date/Time: 10/20/2023/4:52:13 PM    Final    CARDIAC CATHETERIZATION Result Date: 10/20/2023   Dist LM lesion is 50% stenosed.   Prox LAD lesion is 80% stenosed.   1st Diag lesion is 70% stenosed.   Mid LAD lesion is 40% stenosed.   Ost Cx to Prox Cx lesion is 80% stenosed.   Mid Cx lesion is 99% stenosed.   Dist Cx lesion is 99% stenosed.   Dist RCA lesion is 80% stenosed.   Prox RCA lesion is 65% stenosed.   Mid RCA lesion is 50% stenosed. 1.  Severe multivessel disease. 2.  LVEDP of 38 mmHg. Recommendation: Intensive diuresis and cardiothoracic surgical evaluation.   CT CORONARY FRACTIONAL FLOW RESERVE FLUID ANALYSIS Result Date:  10/20/2023 EXAM: CT FFR ANALYSIS CLINICAL DATA:  abnormal imaging of coronary circulation FINDINGS: FFRct analysis was performed on the original cardiac CT angiogram dataset. Diagrammatic representation of the FFRct analysis is provided in a separate PDF document in PACS. This dictation was created using the PDF document and an interactive 3D model of the results. 3D model is not available in the EMR/PACS. Normal FFR range is >0.80. 1. Left Main:  No significant stenosis. FFR = 1.00 2. LAD: Significant flow limitation in mid and distal vessel. Proximal FFR = 0.97, Mid FFR = 0.71, Distal FFR = <0.5 3. LCX: Significant flow limitation in distal vessel. Proximal FFR = 0.98, mid = 0.86, Distal FFR = 0.63 4. RCA: Significant flow limitation in mid and distal vessel. Proximal FFR = 0.99, Mid FFR = 0.78, Distal FFR = 0.67 IMPRESSION: 1. Severe diffuse plaque throughout proximal LAD, LCx, and RCA. This leads to downstream flow limitations in all three vessels as noted above. 2. Total plaque volume 1666 mm3 which is 100th percentile for age- and sex-matched controls (calcified plaque 546 mm3; non-calcified plaque 1120 mm3). TPV is extensive. Electronically Signed   By: Sheryle Donning M.D.   On: 10/20/2023 07:24   CT CORONARY MORPH W/CTA COR W/SCORE W/CA W/CM &/OR WO/CM Addendum Date: 10/19/2023 ADDENDUM REPORT: 10/19/2023 14:46 EXAM: OVER-READ INTERPRETATION  CT CHEST The following report is an over-read performed by radiologist Dr. Kriss Peter [facility] on 10/19/2023. This over-read does not include interpretation of cardiac or coronary anatomy or pathology. The coronary CTA interpretation by the cardiologist is attached. COMPARISON:  Chest radiograph dated 07/30/2019. FINDINGS: The visualized thoracic aorta and pulmonary arteries appear unremarkable. No hilar or mediastinal adenopathy identified. The visualized esophagus is unremarkable. The visualized lungs are clear. No acute findings in the  visualized upper abdomen. No acute osseous pathology. IMPRESSION: No acute extracardiac findings. Electronically Signed   By: Angus Bark M.D.   On: 10/19/2023 14:46   Result Date: 10/19/2023 HISTORY: Chest pain, nonspecific EXAM: Cardiac/Coronary CT TECHNIQUE:  The patient was scanned on a [employer]. PROTOCOL: A 120 kV prospective scan was triggered in the descending thoracic aorta at 111 HU's. Axial non-contrast 3 mm slices were carried out through the heart. The data set was analyzed on a dedicated work station and scored using the Agatston method. Gantry rotation speed was 250 msecs and collimation was 0.6 mm. Heart rate was optimized medically and sl NTG was given. The 3D data set was reconstructed in 5% intervals of the 35-75 % of the R-R cycle. Systolic and diastolic phases were analyzed on a dedicated work station using MPR, MIP and VRT modes. The patient received 95mL OMNIPAQUE  IOHEXOL  350 MG/ML SOLN of contrast. FINDINGS: Coronary calcium  score: The patient's coronary artery calcium  score is 2837, which places the patient in the 99th percentile. Coronary arteries: Normal coronary origins.  Right dominance. Right Coronary Artery: Normal caliber vessel, gives rise to PDA. Diffuse mixed calcified and noncalcified plaque throughout vessel. Several areas of focal stenosis in proximal and mid portions of vessel of at least 50-69%, may be more severe, limited by interference from calcification. Left Main Coronary Artery: Normal caliber vessel. Mixed calcified and noncalcified plaque with 1-24% stenosis. Left Anterior Descending Coronary Artery: Normal caliber vessel. Diffused mixed calcified and noncalcified plaque throughout vessel, most prominent in proximal and mid portions. Several areas in proximal LAD concerning for 70-99% stenosis. Mid portion with at least 50-69% stenosis, cannot excluded higher grade stenosis due to artifact from calcium . Gives rise to large first diagonal branch with  calcified and noncalcified plaque, stenosis not well visualized due to calcification. Left Circumflex Artery: Normal caliber vessel. Diffused mixed calcified and noncalcified plaque throughout vessel, most prominent in proximal and mid portions. Gives rise to 2 OM branches, both with significant mixed calcified and noncalcified plaque, stenosis not well visualized. Aorta: Normal size. Aortic atherosclerosis. No dissection seen in visualized portions of the aorta. Aortic Valve: No calcifications. Trileaflet. Other findings: Normal pulmonary vein drainage into the left atrium. Normal left atrial appendage without a thrombus. Normal size of the pulmonary artery. Normal appearance of the pericardium. Mild mitral annular calcification IMPRESSION: 1. Diffuse severe CAD, CADRADS = 4. CT FFR will be performed and reported separately. 70-99% proximal LAD, with significant diffuse stenosis in RCA of at least 50-69% and possibly more severe. Calcification causes significant limitations to determination of stenosis. 2. Coronary calcium  score of 2837. This was 99th percentile for age-, sex-, and race- matched controls. 3. Total plaque volume information pending, will be added to FFR report once complete. 4. Normal coronary origin with right dominance. Findings communicated with Dr. Lavonne Prairie. INTERPRETATION: CAD-RADS 4: Severe stenosis. (70-99% or > 50% left main). Cardiac catheterization or CT FFR is recommended. Consider symptom-guided anti-ischemic pharmacotherapy as well as risk factor modification per guideline directed care. Invasive coronary angiography recommended with revascularization per published guideline statements. Electronically Signed: By: Sheryle Donning M.D. On: 10/19/2023 14:33    Cardiac Studies   Echocardiogram, coronary CTA, left heart catheterization  Patient Profile     43 y.o. female female with severe multivessel coronary artery disease, ischemic cardiomyopathy EF 35 to 40%, peripheral  artery disease, hyperlipidemia, diabetes, history of CVA  Assessment & Plan    Severe multivessel coronary artery disease Ischemic cardiomyopathy EF 35 to 40% Hyperlipidemia Diabetes mellitus History of CVA Peripheral artery disease status post left femoral-popliteal artery bypass in 2023  Recent heart catheterization showed severe multivessel disease she is pending cardiothoracic CT evaluation.  For now continue medical management.  Heart failure with reduced ejection fraction-recent echo showed EF of 35 to 40% which is a drop from previous which was 45 to 50%.  ACE inhibitor and Aldactone held earlier this admission due to kidney failure.  Creatinine is 1.28 if this remains stable we can start low-dose ARB by tomorrow and then likely eventually transition to Entresto.  Hyperlipidemia - continue with current statin medication.  LDL at target.  Peripheral artery disease-continue current medication regimen.  History of CVA continue current medication regimen.  Pneumonia per hospitalist team. Diabetes per hospitalist team.  Discussed plan with the patient and her brother at the bedside - she did not have question but I was able to answer her brother's questions.       For questions or updates, please contact [facility] Please consult [email or web address] for contact info under Cardiology/STEMI.      Signed, Brighten Buzzelli, DO  10/21/2023, 8:52 AM

## 2023-10-21 NOTE — Progress Notes (Signed)
 PHARMACY - ANTICOAGULATION CONSULT NOTE  Pharmacy Consult for heparin  Indication: chest pain/ACS  Allergies  Allergen Reactions   Amitriptyline Other (See Comments)    Unresponsive    Contrast Media [Iodinated Contrast Media] Other (See Comments)    2013-had CVA, went into AFR-combination ABT, contrast, low BP   Morphine  And Codeine Other (See Comments)    Chest pain   Promethazine  Swelling    Lips swell   Compazine Other (See Comments)    -Alert but unable to move   Prochlorperazine Maleate Other (See Comments)    -Alert, but unable to move   Adhesive [Tape] Rash    Long term exposure causes skin to tear    Oxycodone  Nausea And Vomiting    Severe n/v -  patient will not take oxycodone  or percocet.   Sumatriptan Rash    Patient Measurements: Height: 5\' 5"  (165.1 cm) Weight: 97.3 kg (214 lb 8.1 oz) IBW/kg (Calculated) : 57 HEPARIN  DW (KG): 78.4  Vital Signs: Temp: 98.5 F (36.9 C) (05/10 0724) Temp Source: Oral (05/10 0724) BP: 122/74 (05/10 0724) Pulse Rate: 82 (05/10 0724)  Labs: Recent Labs    10/19/23 0257 10/19/23 1642 10/20/23 0309 10/20/23 2325 10/21/23 1010  HGB 8.7*  --  9.1*  --  8.9*  HCT 30.4*  --  31.6*  --  30.8*  PLT 215  --  252  --  212  HEPARINUNFRC  --   --   --   --  0.63  CREATININE 0.96   < > 0.99 1.28* 0.91   < > = values in this interval not displayed.    Estimated Creatinine Clearance: 91 mL/min (by C-G formula based on SCr of 0.91 mg/dL).   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   due to elevated blood sugar 1700-off insulin  6 months   Depression    Diabetes mellitus    off insulin  6 months   Diverticulitis    Dyspnea    when walking   GERD (gastroesophageal reflux disease)    Hypertension    Kidney dialysis status    on dialysis(stroke) off after 4-6 weeks    Pneumonia 2013, 06/2018   Respiratory failure (HCC) 04/2012   with stroke - in hosp [facility] 12 weeks   Seizures (HCC)    during elvated blood sugsr episode 11/13   Sepsis (HCC)    Stroke (HCC)    11/2018, 12/2018 Weak left side, speech- slurred, Short term memeroy, Gait unsteady    Vocal cord dysfunction     Assessment: 43 yo W with multivessel CAD pending CT Surgery evaluation. No anticoagulation prior to admission. Pharmacy consulted for heparin .    Heparin  level therapeutic at 0.63, CBC low but stable.  Goal of Therapy:  Heparin  level 0.3-0.7 units/ml Monitor platelets by anticoagulation protocol: Yes   Plan:  Continue heparin  1100 units/h Daily heparin  level, CBC  Levin Reamer, PharmD, BCPS, [facility] Clinical Pharmacist [phone] Please check AMION for all [facility] Pharmacy numbers 10/21/2023

## 2023-10-21 NOTE — Progress Notes (Addendum)
 [facility] CONSULT PROGRESS NOTE    Progress Note  Taylor [name]  ZOX:[id number] DOB: 1980/09/06 DOA: 10/17/2023 PCP: Theoplis Fix, MD   Brief Narrative:   Taylor [name] is an 43 y.o. female past medical history of diabetes mellitus type 2, essential hypertension, history of CVA with short-term memory problems, history of seizure and strokes comes in for [facility] for intermittent chest pain that started the day prior to admission   Assessment/Plan:   Acute respiratory failure probably with hypoxia 2/2 Lobar Pneumonia: CTA done at [facility] for elevated D-dimer showed left lower lobe and right lung base infiltrate with a elevated white blood cell count admission Continue current antibiotic regimen complete 7-day course of antibiotics.  Atypical chest pain Left heart cath the of her coronary showed diffuse plaque throughout LAD circumflex and RCA. Cardiothoracic surgery evaluation is pending. Continue aspirin , Plavix  statins and beta-blockers. Further management per cardiology.  Continue medical management.  Acute kidney injury: Hold Farxiga , ACE inhibitor and NSAIDs, she was just given contrast. With a baseline creatinine of 0.8. Creatinine has returned to baseline with conservative management.  Chronic systolic heart failure: Echo in April 2025 showed an EF of 45%. Farxiga  and ACE inhibitor were held due to acute kidney injury. Continue metoprolol   Type II diabetes mellitus (HCC) Last A1c of 7.8.  She is not off steroids to help with the management of her diabetes. Continue long-acting insulin  plus sliding scale blood glucose elevated, she is on prednisone  for her allergy to contrast.   Blood glucose still running high increase long-acting insulin  continue sliding scale CBGs AC and at bedtime.  Microcytic anemia Patient is on Eliquis, hemoglobin has remained relatively stable at 8.9, MCV is low likely iron  deficient. Ferritin of 25, iron  of 24 TIBC of 427  saturation of 6%, started on oral iron .  Depressive disorder Continue Prozac  and trazodone .  PAD (peripheral artery disease) (HCC) Continue aspirin , Plavix  statins and beta-blockers.  Essential hypertension: ACE inhibitor  were held due to borderline blood pressure. Blood pressure is stable.    At high risk for falls Will need PT  Seizures (HCC) Continue aspiration precaution and Keppra .   DVT prophylaxis: lovenox  Family Communication:none Status is: Observation The patient will require care spanning > 2 midnights and should be moved to inpatient because: Acute respiratory failure with hypoxia possibly due to acute community-acquired pneumonia    Code Status:     Code Status Orders  (From admission, onward)           Start     Ordered   10/17/23 1600  Full code  Continuous       Question:  By:  Answer:  Consent: discussion documented in EHR   10/17/23 1602           Code Status History     Date Active Date Inactive Code Status Order ID Comments User Context   02/04/2022 1429 02/09/2022 1653 Full Code [id number]  Taylor Cobbs, PA-C Inpatient   02/01/2022 0920 02/01/2022 1836 Full Code [id number]  Taylor Shell, MD Inpatient   04/01/2020 1830 04/02/2020 0117 Full Code [id number]  Taylor Hensen, MD Inpatient   10/22/2019 1324 10/22/2019 2114 Full Code [id number]  Taylor Shell, MD Inpatient   06/23/2019 1852 07/05/2019 2138 Full Code [id number]  Taylor Kass, MD Inpatient   02/20/2019 1842 02/21/2019 2014 Full Code [id number]  Taylor Sages, MD Inpatient      Advance Directive Documentation    Flowsheet Row Most  Recent Value  Type of Advance Directive Healthcare Power of Attorney  Pre-existing out of facility DNR order (yellow form or pink MOST form) --  "MOST" Form in Place? --         IV Access:   Peripheral IV   Procedures and diagnostic studies:   ECHOCARDIOGRAM COMPLETE Result Date: 10/20/2023    ECHOCARDIOGRAM REPORT   Patient  Name:   Taylor [name] Date of Exam: 10/20/2023 Medical Rec #:  [id number]     Height:       65.0 in Accession #:    [id number]    Weight:       213.4 lb Date of Birth:  [birth year]      BSA:          2.033 m Patient Age:    43 years      BP:           130/79 mmHg Patient Gender: F             HR:           74 bpm. Exam Location:  Inpatient Procedure: 2D Echo (Both Spectral and Color Flow Doppler were utilized during            procedure). Indications:    CAD of native vessel  History:        Patient has no prior history of Echocardiogram examinations.                 CAD, PAD, Signs/Symptoms:Chest Pain; Risk Factors:Hypertension.  Sonographer:    Dione Franks RDCS Referring Phys: [id number] STEVEN C HENDRICKSON IMPRESSIONS  1. Septal , mid/basal inferior wall and lateral wall hypokinesis . Left ventricular ejection fraction, by estimation, is 35 to 40%. The left ventricle has moderately decreased function. The left ventricle demonstrates regional wall motion abnormalities (see scoring diagram/findings for description). The left ventricular internal cavity size was mildly dilated. Left ventricular diastolic parameters are consistent with Grade II diastolic dysfunction (pseudonormalization). Elevated left ventricular end-diastolic pressure.  2. Right ventricular systolic function is normal. The right ventricular size is normal.  3. Left atrial size was moderately dilated.  4. Moderate subvalvular calcification of the posterior leaflet chords . The mitral valve is abnormal. Trivial mitral valve regurgitation. No evidence of mitral stenosis.  5. The aortic valve is tricuspid. Aortic valve regurgitation is not visualized. No aortic stenosis is present.  6. The inferior vena cava is normal in size with greater than 50% respiratory variability, suggesting right atrial pressure of 3 mmHg. FINDINGS  Left Ventricle: Septal , mid/basal inferior wall and lateral wall hypokinesis. Left ventricular ejection fraction, by estimation, is  35 to 40%. The left ventricle has moderately decreased function. The left ventricle demonstrates regional wall motion abnormalities. Strain was performed and the global longitudinal strain is indeterminate. The left ventricular internal cavity size was mildly dilated. There is no left ventricular hypertrophy. Left ventricular diastolic parameters are consistent with Grade II diastolic dysfunction (pseudonormalization). Elevated left ventricular end-diastolic pressure. Right Ventricle: The right ventricular size is normal. No increase in right ventricular wall thickness. Right ventricular systolic function is normal. Left Atrium: Left atrial size was moderately dilated. Right Atrium: Right atrial size was normal in size. Pericardium: Trivial pericardial effusion is present. The pericardial effusion is posterior to the left ventricle. Mitral Valve: Moderate subvalvular calcification of the posterior leaflet chords. The mitral valve is abnormal. There is mild thickening of the mitral valve leaflet(s). There is mild calcification of the mitral valve  leaflet(s). Mild mitral annular calcification. Trivial mitral valve regurgitation. No evidence of mitral valve stenosis. Tricuspid Valve: The tricuspid valve is normal in structure. Tricuspid valve regurgitation is trivial. No evidence of tricuspid stenosis. Aortic Valve: The aortic valve is tricuspid. Aortic valve regurgitation is not visualized. No aortic stenosis is present. Pulmonic Valve: The pulmonic valve was normal in structure. Pulmonic valve regurgitation is not visualized. No evidence of pulmonic stenosis. Aorta: The aortic root is normal in size and structure. Venous: The inferior vena cava is normal in size with greater than 50% respiratory variability, suggesting right atrial pressure of 3 mmHg. IAS/Shunts: No atrial level shunt detected by color flow Doppler. Additional Comments: 3D was performed not requiring image post processing on an independent  workstation and was indeterminate.  LEFT VENTRICLE PLAX 2D LVIDd:         5.60 cm     Diastology LVIDs:         4.50 cm     LV e' medial:    5.77 cm/s LV PW:         1.10 cm     LV E/e' medial:  17.2 LV IVS:        1.00 cm     LV e' lateral:   6.09 cm/s LVOT diam:     2.20 cm     LV E/e' lateral: 16.3 LV SV:         65 LV SV Index:   32 LVOT Area:     3.80 cm  LV Volumes (MOD) LV vol d, MOD A4C: 98.2 ml LV vol s, MOD A4C: 63.8 ml LV SV MOD A4C:     98.2 ml RIGHT VENTRICLE             IVC RV Basal diam:  2.50 cm     IVC diam: 1.70 cm RV S prime:     13.80 cm/s TAPSE (M-mode): 2.4 cm LEFT ATRIUM             Index        RIGHT ATRIUM           Index LA diam:        5.00 cm 2.46 cm/m   RA Area:     12.10 cm LA Vol (A2C):   43.9 ml 21.59 ml/m  RA Volume:   26.50 ml  13.03 ml/m LA Vol (A4C):   88.4 ml 43.47 ml/m LA Biplane Vol: 66.0 ml 32.46 ml/m  AORTIC VALVE LVOT Vmax:   78.50 cm/s LVOT Vmean:  52.800 cm/s LVOT VTI:    0.170 m  AORTA Ao Root diam: 2.90 cm Ao Asc diam:  2.70 cm MITRAL VALVE MV Area (PHT): 4.39 cm    SHUNTS MV Decel Time: 173 msec    Systemic VTI:  0.17 m MV E velocity: 99.40 cm/s  Systemic Diam: 2.20 cm MV A velocity: 66.30 cm/s MV E/A ratio:  1.50 Janelle Mediate MD Electronically signed by Janelle Mediate MD Signature Date/Time: 10/20/2023/4:52:13 PM    Final    CARDIAC CATHETERIZATION Result Date: 10/20/2023   Dist LM lesion is 50% stenosed.   Prox LAD lesion is 80% stenosed.   1st Diag lesion is 70% stenosed.   Mid LAD lesion is 40% stenosed.   Ost Cx to Prox Cx lesion is 80% stenosed.   Mid Cx lesion is 99% stenosed.   Dist Cx lesion is 99% stenosed.   Dist RCA lesion is 80% stenosed.   Prox RCA lesion is 65%  stenosed.   Mid RCA lesion is 50% stenosed. 1.  Severe multivessel disease. 2.  LVEDP of 38 mmHg. Recommendation: Intensive diuresis and cardiothoracic surgical evaluation.   CT CORONARY FRACTIONAL FLOW RESERVE FLUID ANALYSIS Result Date: 10/20/2023 EXAM: CT FFR ANALYSIS CLINICAL DATA:   abnormal imaging of coronary circulation FINDINGS: FFRct analysis was performed on the original cardiac CT angiogram dataset. Diagrammatic representation of the FFRct analysis is provided in a separate PDF document in PACS. This dictation was created using the PDF document and an interactive 3D model of the results. 3D model is not available in the EMR/PACS. Normal FFR range is >0.80. 1. Left Main:  No significant stenosis. FFR = 1.00 2. LAD: Significant flow limitation in mid and distal vessel. Proximal FFR = 0.97, Mid FFR = 0.71, Distal FFR = <0.5 3. LCX: Significant flow limitation in distal vessel. Proximal FFR = 0.98, mid = 0.86, Distal FFR = 0.63 4. RCA: Significant flow limitation in mid and distal vessel. Proximal FFR = 0.99, Mid FFR = 0.78, Distal FFR = 0.67 IMPRESSION: 1. Severe diffuse plaque throughout proximal LAD, LCx, and RCA. This leads to downstream flow limitations in all three vessels as noted above. 2. Total plaque volume 1666 mm3 which is 100th percentile for age- and sex-matched controls (calcified plaque 546 mm3; non-calcified plaque 1120 mm3). TPV is extensive. Electronically Signed   By: Sheryle Donning M.D.   On: 10/20/2023 07:24   CT CORONARY MORPH W/CTA COR W/SCORE W/CA W/CM &/OR WO/CM Addendum Date: 10/19/2023 ADDENDUM REPORT: 10/19/2023 14:46 EXAM: OVER-READ INTERPRETATION  CT CHEST The following report is an over-read performed by radiologist Dr. Kriss Peter [facility] on 10/19/2023. This over-read does not include interpretation of cardiac or coronary anatomy or pathology. The coronary CTA interpretation by the cardiologist is attached. COMPARISON:  Chest radiograph dated 07/30/2019. FINDINGS: The visualized thoracic aorta and pulmonary arteries appear unremarkable. No hilar or mediastinal adenopathy identified. The visualized esophagus is unremarkable. The visualized lungs are clear. No acute findings in the visualized upper abdomen. No acute osseous  pathology. IMPRESSION: No acute extracardiac findings. Electronically Signed   By: Angus Bark M.D.   On: 10/19/2023 14:46   Result Date: 10/19/2023 HISTORY: Chest pain, nonspecific EXAM: Cardiac/Coronary CT TECHNIQUE: The patient was scanned on a [employer]. PROTOCOL: A 120 kV prospective scan was triggered in the descending thoracic aorta at 111 HU's. Axial non-contrast 3 mm slices were carried out through the heart. The data set was analyzed on a dedicated work station and scored using the Agatston method. Gantry rotation speed was 250 msecs and collimation was 0.6 mm. Heart rate was optimized medically and sl NTG was given. The 3D data set was reconstructed in 5% intervals of the 35-75 % of the R-R cycle. Systolic and diastolic phases were analyzed on a dedicated work station using MPR, MIP and VRT modes. The patient received 95mL OMNIPAQUE  IOHEXOL  350 MG/ML SOLN of contrast. FINDINGS: Coronary calcium  score: The patient's coronary artery calcium  score is 2837, which places the patient in the 99th percentile. Coronary arteries: Normal coronary origins.  Right dominance. Right Coronary Artery: Normal caliber vessel, gives rise to PDA. Diffuse mixed calcified and noncalcified plaque throughout vessel. Several areas of focal stenosis in proximal and mid portions of vessel of at least 50-69%, may be more severe, limited by interference from calcification. Left Main Coronary Artery: Normal caliber vessel. Mixed calcified and noncalcified plaque with 1-24% stenosis. Left Anterior Descending Coronary Artery: Normal caliber vessel. Diffused mixed  calcified and noncalcified plaque throughout vessel, most prominent in proximal and mid portions. Several areas in proximal LAD concerning for 70-99% stenosis. Mid portion with at least 50-69% stenosis, cannot excluded higher grade stenosis due to artifact from calcium . Gives rise to large first diagonal branch with calcified and noncalcified plaque, stenosis  not well visualized due to calcification. Left Circumflex Artery: Normal caliber vessel. Diffused mixed calcified and noncalcified plaque throughout vessel, most prominent in proximal and mid portions. Gives rise to 2 OM branches, both with significant mixed calcified and noncalcified plaque, stenosis not well visualized. Aorta: Normal size. Aortic atherosclerosis. No dissection seen in visualized portions of the aorta. Aortic Valve: No calcifications. Trileaflet. Other findings: Normal pulmonary vein drainage into the left atrium. Normal left atrial appendage without a thrombus. Normal size of the pulmonary artery. Normal appearance of the pericardium. Mild mitral annular calcification IMPRESSION: 1. Diffuse severe CAD, CADRADS = 4. CT FFR will be performed and reported separately. 70-99% proximal LAD, with significant diffuse stenosis in RCA of at least 50-69% and possibly more severe. Calcification causes significant limitations to determination of stenosis. 2. Coronary calcium  score of 2837. This was 99th percentile for age-, sex-, and race- matched controls. 3. Total plaque volume information pending, will be added to FFR report once complete. 4. Normal coronary origin with right dominance. Findings communicated with Dr. Lavonne Prairie. INTERPRETATION: CAD-RADS 4: Severe stenosis. (70-99% or > 50% left main). Cardiac catheterization or CT FFR is recommended. Consider symptom-guided anti-ischemic pharmacotherapy as well as risk factor modification per guideline directed care. Invasive coronary angiography recommended with revascularization per published guideline statements. Electronically Signed: By: Sheryle Donning M.D. On: 10/19/2023 14:33     Medical Consultants:   None.   Subjective:    [name] complaining of back pain.  Objective:    Vitals:   10/20/23 1933 10/20/23 2343 10/21/23 0300 10/21/23 0724  BP: (!) 110/58 (!) 121/56 121/70 122/74  Pulse: 83 85 78 82  Resp: 18 18 16 16    Temp: 97.8 F (36.6 C) 98.3 F (36.8 C) 98 F (36.7 C) 98.5 F (36.9 C)  TempSrc: Oral Oral Oral Oral  SpO2: 98% 94% 97% 92%  Weight:   97.3 kg   Height:       SpO2: 92 % O2 Flow Rate (L/min): 2 L/min   Intake/Output Summary (Last 24 hours) at 10/21/2023 1052 Last data filed at 10/21/2023 0800 Gross per 24 hour  Intake 1655.77 ml  Output 2475 ml  Net -819.23 ml   Filed Weights   10/19/23 0545 10/20/23 0608 10/21/23 0300  Weight: 98.4 kg 96.8 kg 97.3 kg    Exam: General exam: In no acute distress. Respiratory system: Good air movement and clear to auscultation. Cardiovascular system: S1 & S2 heard, RRR. No JVD. Gastrointestinal system: Abdomen is nondistended, soft and nontender.  Extremities: No pedal edema. Skin: No rashes, lesions or ulcers Psychiatry: Judgement and insight appear normal. Mood & affect appropriate.  Data Reviewed:    Labs: Basic Metabolic Panel: Recent Labs  Lab 10/18/23 0250 10/19/23 0257 10/19/23 1642 10/20/23 0309 10/20/23 2325  NA 137 136 131* 136 133*  K 4.8 5.0 5.3* 4.7 4.8  CL 97* 101 94* 101 96*  CO2 29 26 25 27 27   GLUCOSE 248* 236* 461* 254* 519*  BUN 27* 26* 26* 23* 30*  CREATININE 1.10* 0.96 1.37* 0.99 1.28*  CALCIUM  9.9 9.0 9.0 9.8 9.3   GFR Estimated Creatinine Clearance: 64.7 mL/min (A) (by C-G formula based  on SCr of 1.28 mg/dL (H)). Liver Function Tests: Recent Labs  Lab 10/17/23 1625  AST 36  ALT 26  ALKPHOS 76  BILITOT 0.6  PROT 7.1  ALBUMIN 3.6   No results for input(s): "LIPASE", "AMYLASE" in the last 168 hours. No results for input(s): "AMMONIA" in the last 168 hours. Coagulation profile No results for input(s): "INR", "PROTIME" in the last 168 hours. COVID-19 Labs  No results for input(s): "DDIMER", "FERRITIN", "LDH", "CRP" in the last 72 hours.   Lab Results  Component Value Date   SARSCOV2NAA NEGATIVE 03/31/2020   SARSCOV2NAA NEGATIVE 10/18/2019   SARSCOV2NAA Not Detected 05/14/2019    SARSCOV2NAA NEGATIVE 03/21/2019    CBC: Recent Labs  Lab 10/17/23 1625 10/18/23 0250 10/19/23 0257 10/20/23 0309 10/21/23 1010  WBC 19.5* 16.1* 11.2* 18.4* 16.5*  HGB 9.6* 8.9* 8.7* 9.1* 8.9*  HCT 32.5* 30.5* 30.4* 31.6* 30.8*  MCV 68.3* 68.8* 69.2* 69.0* 69.2*  PLT 262 246 215 252 212   Cardiac Enzymes: No results for input(s): "CKTOTAL", "CKMB", "CKMBINDEX", "TROPONINI" in the last 168 hours. BNP (last 3 results) No results for input(s): "PROBNP" in the last 8760 hours. CBG: Recent Labs  Lab 10/20/23 1154 10/20/23 1645 10/20/23 2112 10/21/23 0223 10/21/23 0617  GLUCAP 272* 212* 578* 392* 344*   D-Dimer: No results for input(s): "DDIMER" in the last 72 hours. Hgb A1c: No results for input(s): "HGBA1C" in the last 72 hours.  Lipid Profile: No results for input(s): "CHOL", "HDL", "LDLCALC", "TRIG", "CHOLHDL", "LDLDIRECT" in the last 72 hours.  Thyroid function studies: No results for input(s): "TSH", "T4TOTAL", "T3FREE", "THYROIDAB" in the last 72 hours.  Invalid input(s): "FREET3"  Anemia work up: No results for input(s): "VITAMINB12", "FOLATE", "FERRITIN", "TIBC", "IRON ", "RETICCTPCT" in the last 72 hours.  Sepsis Labs: Recent Labs  Lab 10/18/23 0250 10/19/23 0257 10/20/23 0309 10/21/23 1010  WBC 16.1* 11.2* 18.4* 16.5*   Microbiology Recent Results (from the past 240 hours)  Respiratory (~20 pathogens) panel by PCR     Status: None   Collection Time: 10/17/23  7:55 PM   Specimen: Nasopharyngeal Swab; Respiratory  Result Value Ref Range Status   Adenovirus NOT DETECTED NOT DETECTED Final   Coronavirus 229E NOT DETECTED NOT DETECTED Final    Comment: (NOTE) The Coronavirus on the Respiratory Panel, DOES NOT test for the novel  Coronavirus (2019 nCoV)    Coronavirus HKU1 NOT DETECTED NOT DETECTED Final   Coronavirus NL63 NOT DETECTED NOT DETECTED Final   Coronavirus OC43 NOT DETECTED NOT DETECTED Final   Metapneumovirus NOT DETECTED NOT DETECTED  Final   Rhinovirus / Enterovirus NOT DETECTED NOT DETECTED Final   Influenza A NOT DETECTED NOT DETECTED Final   Influenza B NOT DETECTED NOT DETECTED Final   Parainfluenza Virus 1 NOT DETECTED NOT DETECTED Final   Parainfluenza Virus 2 NOT DETECTED NOT DETECTED Final   Parainfluenza Virus 3 NOT DETECTED NOT DETECTED Final   Parainfluenza Virus 4 NOT DETECTED NOT DETECTED Final   Respiratory Syncytial Virus NOT DETECTED NOT DETECTED Final   Bordetella pertussis NOT DETECTED NOT DETECTED Final   Bordetella Parapertussis NOT DETECTED NOT DETECTED Final   Chlamydophila pneumoniae NOT DETECTED NOT DETECTED Final   Mycoplasma pneumoniae NOT DETECTED NOT DETECTED Final    Comment: Performed at [facility], [address] [place], [place] [address]  MRSA Next Gen by PCR, Nasal     Status: None   Collection Time: 10/17/23  7:55 PM   Specimen: Nasal Mucosa;  Nasal Swab  Result Value Ref Range Status   MRSA by PCR Next Gen NOT DETECTED NOT DETECTED Final    Comment: (NOTE) The GeneXpert MRSA Assay (FDA approved for NASAL specimens only), is one component of a comprehensive MRSA colonization surveillance program. It is not intended to diagnose MRSA infection nor to guide or monitor treatment for MRSA infections. Test performance is not FDA approved in patients less than 76 years old. Performed at [facility], [address] [place], [place] [address]      Medications:    aspirin  EC  81 mg Oral Daily   atorvastatin   80 mg Oral Daily   azithromycin   500 mg Oral Daily   diazepam   5 mg Oral BID   ferrous gluconate   324 mg Oral Daily   FLUoxetine   10 mg Oral Daily   furosemide  40 mg Intravenous BID   insulin  aspart  0-20 Units Subcutaneous TID WC   insulin  aspart  0-5 Units Subcutaneous QHS   insulin  aspart  6 Units Subcutaneous TID WC   insulin  glargine-yfgn  15 Units Subcutaneous BID   levETIRAcetam   1,000 mg Oral Daily   magnesium  oxide  400 mg Oral Daily    methocarbamol   500 mg Oral BID   metoprolol  succinate  50 mg Oral Daily   pantoprazole   40 mg Oral Daily   pregabalin   150 mg Oral TID   risperiDONE   0.5 mg Oral QHS   sodium chloride  flush  3 mL Intravenous Q12H   traMADol   50 mg Oral QID   traZODone   200 mg Oral QHS   Continuous Infusions:  sodium chloride      cefTRIAXone  (ROCEPHIN )  IV 1 g (10/21/23 0900)   heparin  1,100 Units/hr (10/21/23 0546)      LOS: 4 days   Macdonald Savoy  [facility]  10/21/2023, 10:52 AM

## 2023-10-21 NOTE — Plan of Care (Signed)

## 2023-10-21 NOTE — Consult Note (Addendum)
 [address]       [place] [address]             [id number]        [name] Medical Record #[id number] Date of Birth: 1980/07/28  Referring: Orpha Blade, MD Primary Care: Theoplis Fix, MD Primary Cardiologist:James Hochrein, MD  Reason for consult: Evaluation for potential surgical management of multivessel coronary artery disease.   History of Present Illness: [name] is a 43 year old female with a past history notable for bipolar disorder, class II obesity, type 2 diabetes mellitus, hypertension,  peripheral artery disease status post left femoral to popliteal bypass in 2023 and left forefoot amputation.  She also has history of CVA x 3 in 2013 resulting in prolonged hospitalizations and tracheostomy.  She developed a seizure disorder following the strokes but is unable to remember the last time she had a seizure.  This evidence was transferred from [facility] on 10/17/23 for evaluation of chest pain associated with shortness of breath that had started the night previous.  Workup included high-sensitivity troponins that were elevated 80-85 but remained flat.  EKG showed normal sinus rhythm with nonspecific ST-T wave changes.  CTA showed no evidence of pulmonary embolus but showed diffuse micronodular infiltrates throughout the left lower lobe and in the right lower lobe as well.  She was started on Rocephin  and azithromycin  for suspected atypical pneumonia.  She went on to have cardiac workup with a coronary CTA on 10/19/2023 showing extensive calcification at the 99th percentile.  Left heart catheterization was then recommended by Dr. Lavonne Prairie and was conducted yesterday confirming severe three-vessel coronary artery disease.  Please see the detailed report below. An echocardiogram was obtained yesterday as well showing mid basal inferior wall and lateral wall hypokinesis with left ventricular ejection fraction estimated at 35-40 percent,  right ventricular function was normal, and there was no significant valvular disease.   CT surgery has been asked to evaluate [name] for consideration of coronary bypass grafting.  [name] is is resting in bed at the time of this encounter.  IV heparin  is infusing.  She denies any chest pain or shortness of breath.  She is edentulous with a full plate upper and lower dentures.  She lives with her mother.   Current Activity/ Functional Status: Patient is independent with mobility/ambulation, transfers, ADL's, IADL's.   Zubrod Score: At the time of surgery this patient's most appropriate activity status/level should be described as: []     0    Normal activity, no symptoms []     1    Restricted in physical strenuous activity but ambulatory, able to do out light work []     2    Ambulatory and capable of self care, unable to do work activities, up and about                 more than 50%  Of the time                            [x]     3    Only limited self care, in bed greater than 50% of waking hours []     4    Completely disabled, no self care, confined to bed or chair []     5    Moribund  Past Medical History:  Diagnosis Date   Anxiety    ARF (acute  renal failure) (HCC) 2013   Bipolar disorder (HCC)    Cerebral artery occlusion with cerebral infarction (HCC) 04/2012   due to elevated blood sugar 1700-off insulin  6 months   Depression    Diabetes mellitus    off insulin  6 months   Diverticulitis    Dyspnea    when walking   GERD (gastroesophageal reflux disease)    Hypertension    Kidney dialysis status    on dialysis(stroke) off after 4-6 weeks   Pneumonia 2013, 06/2018   Respiratory failure (HCC) 04/2012   with stroke - in hosp [facility] 12 weeks   Seizures (HCC)    during elvated blood sugsr episode 11/13   Sepsis (HCC)    Stroke (HCC)    11/2018, 12/2018 Weak left side, speech- slurred, Short term memeroy, Gait unsteady    Vocal cord dysfunction     Past Surgical History:   Procedure Laterality Date   ABDOMINAL AORTOGRAM W/LOWER EXTREMITY N/A 06/25/2019   Procedure: ABDOMINAL AORTOGRAM W/LOWER EXTREMITY;  Surgeon: Margherita Shell, MD;  Location: [facility];  Service: Cardiovascular;  Laterality: N/A;   ABDOMINAL AORTOGRAM W/LOWER EXTREMITY N/A 10/22/2019   Procedure: ABDOMINAL AORTOGRAM W/LOWER EXTREMITY;  Surgeon: Margherita Shell, MD;  Location: [facility];  Service: Cardiovascular;  Laterality: N/A;   ABDOMINAL AORTOGRAM W/LOWER EXTREMITY Left 04/01/2020   Procedure: ABDOMINAL AORTOGRAM W/LOWER EXTREMITY;  Surgeon: Young Hensen, MD;  Location: [facility];  Service: Cardiovascular;  Laterality: Left;   ABDOMINAL AORTOGRAM W/LOWER EXTREMITY N/A 02/01/2022   Procedure: ABDOMINAL AORTOGRAM W/LOWER EXTREMITY;  Surgeon: Margherita Shell, MD;  Location: [facility];  Service: Cardiovascular;  Laterality: N/A;   AMPUTATION Left 07/03/2019   Procedure: LEFT FOOT 4TH AND 5TH RAY AMPUTATION;  Surgeon: Timothy Ford, MD;  Location: [facility] OR;  Service: Orthopedics;  Laterality: Left;   APPENDECTOMY     FEMORAL-POPLITEAL BYPASS GRAFT Left 02/04/2022   Procedure: LEFT FEMORAL-POPLITEAL ARTERY BYPASS;  Surgeon: Margherita Shell, MD;  Location: [facility];  Service: Vascular;  Laterality: Left;   INSERTION OF DIALYSIS CATHETER  11/13   removed in 4-6 weeks   IR ANGIO INTRA EXTRACRAN SEL COM CAROTID INNOMINATE BILAT MOD SED  02/07/2019   IR ANGIO VERTEBRAL SEL VERTEBRAL BILAT MOD SED  02/07/2019   IR ANGIOGRAM EXTREMITY LEFT  02/07/2019   IR ANGIOGRAM EXTREMITY RIGHT  02/20/2019   IR TRANSCATH EXCRAN VERT OR CAR A STENT  02/20/2019   IR TRANSCATH EXCRAN VERT OR CAR A STENT  03/25/2019   IR US  GUIDE VASC ACCESS RIGHT  02/07/2019   LEFT HEART CATH AND CORONARY ANGIOGRAPHY N/A 10/20/2023   Procedure: LEFT HEART CATH AND CORONARY ANGIOGRAPHY;  Surgeon: Kyra Phy, MD;  [facility]: [facility];  Service: Cardiovascular;  Laterality: N/A;   MULTIPLE  EXTRACTIONS WITH ALVEOLOPLASTY N/A 02/24/2014   Procedure: MULTIPLE EXTRACTION WITH ALVEOLOPLASTY AND BIOPSY;  Surgeon: Cornelia Dieter, DDS;  Location: [facility];  Service: Oral Surgery;  Laterality: N/A;   PERIPHERAL VASCULAR ATHERECTOMY Left 06/25/2019   Procedure: PERIPHERAL VASCULAR ATHERECTOMY;  Surgeon: Margherita Shell, MD;  Location: [facility];  Service: Cardiovascular;  Laterality: Left;  superficial femoral   PERIPHERAL VASCULAR BALLOON ANGIOPLASTY Left 10/22/2019   Procedure: PERIPHERAL VASCULAR BALLOON ANGIOPLASTY;  Surgeon: Margherita Shell, MD;  [facility]: [facility];  Service: Cardiovascular;  Laterality: Left;  superficial femoral   PERIPHERAL VASCULAR INTERVENTION  04/01/2020   Procedure:  PERIPHERAL VASCULAR INTERVENTION;  Surgeon: Young Hensen, MD;  Location: [facility] CV [facility];  Service: Cardiovascular;;  left SFA stent    RADIOLOGY WITH ANESTHESIA N/A 02/20/2019   Procedure: RADIOLOGY WITH ANESTHESIA   STENTING;  Surgeon: Luellen Sages, MD;  Location: [facility];  Service: Radiology;  Laterality: N/A;   RADIOLOGY WITH ANESTHESIA N/A 03/25/2019   Procedure: RADIOLOGY WITH ANESTHESIA STENTING;  Surgeon: Luellen Sages, MD;  Location: [facility];  Service: Radiology;  Laterality: N/A;   SKIN SPLIT GRAFT Left 07/03/2019   Procedure: APPLY SKIN GRAFT LEFT FOOT;  Surgeon: Timothy Ford, MD;  Location: [facility] OR;  Service: Orthopedics;  Laterality: Left;   TRACHEOSTOMY  11/13   closed 1/14   TRANSRECTAL DRAINAGE OF PELVIC ABSCESS  12/11   VASCULAR SURGERY  04/01/2020   two stents placed in left leg    Social History   Tobacco Use  Smoking Status Former   Current packs/day: 0.00   Average packs/day: 0.6 packs/day for 23.0 years (13.8 ttl pk-yrs)   Types: Cigarettes   Start date: 02/05/1996   Quit date: 02/05/2019   Years since quitting: 4.7   Passive exposure: Never  Smokeless Tobacco Never    Social History   Substance and Sexual Activity  Alcohol Use No    Alcohol/week: 0.0 standard drinks of alcohol     Allergies  Allergen Reactions   Amitriptyline Other (See Comments)    Unresponsive    Contrast Media [Iodinated Contrast Media] Other (See Comments)    2013-had CVA, went into AFR-combination ABT, contrast, low BP   Morphine  And Codeine Other (See Comments)    Chest pain   Promethazine  Swelling    Lips swell   Compazine Other (See Comments)    -Alert but unable to move   Prochlorperazine Maleate Other (See Comments)    -Alert, but unable to move   Adhesive [Tape] Rash    Long term exposure causes skin to tear    Oxycodone  Nausea And Vomiting    Severe n/v -  patient will not take oxycodone  or percocet.   Sumatriptan Rash    Current Facility-Administered Medications  Medication Dose Route Frequency Provider Last Rate Last Admin   0.9 %  sodium chloride  infusion  250 mL Intravenous PRN Thukkani, Arun K, MD       acetaminophen  (TYLENOL ) tablet 650 mg  650 mg Oral Q4H PRN Haley, Sheng L, PA-C   650 mg at 10/19/23 1610   albuterol  (PROVENTIL ) (2.5 MG/3ML) 0.083% nebulizer solution 2.5 mg  2.5 mg Nebulization Q4H PRN [name], MD       aspirin  EC tablet 81 mg  81 mg Oral Daily Haley, Sheng L, PA-C   81 mg at 10/21/23 0848   atorvastatin  (LIPITOR ) tablet 80 mg  80 mg Oral Daily Haley, Sheng L, PA-C   80 mg at 10/21/23 4403   azithromycin  (ZITHROMAX ) tablet 500 mg  500 mg Oral Daily Macdonald Savoy, MD   500 mg at 10/21/23 0848   cefTRIAXone  (ROCEPHIN ) 1 g in sodium chloride  0.9 % 100 mL IVPB  1 g Intravenous Daily Macdonald Savoy, MD 200 mL/hr at 10/21/23 0900 1 g at 10/21/23 0900   diazepam  (VALIUM ) tablet 5 mg  5 mg Oral BID Haley, Sheng L, PA-C   5 mg at 10/21/23 0849   ferrous gluconate  (FERGON) tablet 324 mg  324 mg Oral Daily Morgan Arab L, PA-C   324 mg at 10/21/23 0849   FLUoxetine  (PROZAC ) capsule  10 mg  10 mg Oral Daily Haley, Sheng L, PA-C   10 mg at 10/21/23 0848   furosemide (LASIX) injection 40 mg  40 mg  Intravenous BID Thukkani, Arun K, MD   40 mg at 10/20/23 1712   heparin  ADULT infusion 100 units/mL (25000 units/250mL)  1,100 Units/hr Intravenous Continuous Chen, Lydia D, RPH 11 mL/hr at 10/21/23 0546 1,100 Units/hr at 10/21/23 0546   HYDROcodone -acetaminophen  (NORCO/VICODIN) 5-325 MG per tablet 1-2 tablet  1-2 tablet Oral Q4H PRN Macdonald Savoy, MD   2 tablet at 10/21/23 0981   insulin  aspart (novoLOG ) injection 0-20 Units  0-20 Units Subcutaneous TID WC Bary Boss, DO   15 Units at 10/21/23 1914   insulin  aspart (novoLOG ) injection 0-5 Units  0-5 Units Subcutaneous QHS Bary Boss, DO   5 Units at 10/19/23 2114   insulin  aspart (novoLOG ) injection 6 Units  6 Units Subcutaneous TID WC Macdonald Savoy, MD   6 Units at 10/21/23 7829   insulin  glargine-yfgn (SEMGLEE ) injection 15 Units  15 Units Subcutaneous BID Macdonald Savoy, MD   15 Units at 10/21/23 0847   levETIRAcetam  (KEPPRA  XR) 24 hr tablet 1,000 mg  1,000 mg Oral Daily Haley, Sheng L, PA-C   1,000 mg at 10/21/23 0848   magnesium  oxide (MAG-OX) tablet 400 mg  400 mg Oral Daily Macdonald Savoy, MD   400 mg at 10/21/23 0849   methocarbamol  (ROBAXIN ) tablet 500 mg  500 mg Oral BID Haley, Sheng L, PA-C   500 mg at 10/21/23 5621   metoprolol  succinate (TOPROL -XL) 24 hr tablet 50 mg  50 mg Oral Daily Haley, Sheng L, PA-C   50 mg at 10/21/23 0848   morphine  (PF) 2 MG/ML injection 1 mg  1 mg Intravenous Q4H PRN Macdonald Savoy, MD   1 mg at 10/20/23 1529   nitroGLYCERIN  (NITROSTAT ) SL tablet 0.4 mg  0.4 mg Sublingual Q5 Min x 3 PRN Haley, Sheng L, PA-C       ondansetron  (ZOFRAN ) injection 4 mg  4 mg Intravenous Q6H PRN Haley, Sheng L, PA-C       pantoprazole  (PROTONIX ) EC tablet 40 mg  40 mg Oral Daily Haley, Sheng L, PA-C   40 mg at 10/21/23 3086   pregabalin  (LYRICA ) capsule 150 mg  150 mg Oral TID Haley, Sheng L, PA-C   150 mg at 10/21/23 0849   risperiDONE  (RISPERDAL ) tablet 0.5 mg  0.5 mg Oral QHS Haley, Sheng  L, PA-C   0.5 mg at 10/20/23 2104   sodium chloride  flush (NS) 0.9 % injection 3 mL  3 mL Intravenous Q12H Thukkani, Arun K, MD   3 mL at 10/20/23 2211   sodium chloride  flush (NS) 0.9 % injection 3 mL  3 mL Intravenous PRN Thukkani, Arun K, MD       tiZANidine  (ZANAFLEX ) tablet 2 mg  2 mg Oral BID BM & HS PRN Macdonald Savoy, MD   2 mg at 10/19/23 5784   traMADol  (ULTRAM ) tablet 50 mg  50 mg Oral QID Haley, Sheng L, PA-C   50 mg at 10/21/23 6962   traZODone  (DESYREL ) tablet 200 mg  200 mg Oral QHS Haley, Sheng L, PA-C   200 mg at 10/20/23 2104    Medications Prior to Admission  Medication Sig Dispense Refill Last Dose/Taking   aspirin  81 MG EC tablet Take 81 mg by mouth daily.    Past Week   atorvastatin  (LIPITOR ) 80 MG tablet  TAKE 1 TABLET BY MOUTH DAILY 30 tablet 0 Past Week   clopidogrel  (PLAVIX ) 75 MG tablet Take 1 tablet (75 mg total) by mouth daily. 90 tablet 3 Past Week   dapagliflozin  propanediol (FARXIGA ) 10 MG TABS tablet Take 10 mg by mouth daily.    Past Week   ferrous gluconate  (FERGON) 324 MG tablet Take 324 mg by mouth daily.   Past Week   FLUoxetine  (PROZAC ) 10 MG capsule Take 10 mg by mouth in the morning.   Past Week   hydrOXYzine (VISTARIL) 25 MG capsule Take 25 mg by mouth 3 (three) times daily.   Past Week   labetalol  (NORMODYNE ) 300 MG tablet Take 300 mg by mouth 2 (two) times daily.   Past Week   levETIRAcetam  (KEPPRA  XR) 500 MG 24 hr tablet Take 2 tablets (1,000 mg total) by mouth daily. 180 tablet 3 Past Week   lisinopril  (ZESTRIL ) 40 MG tablet Take 40 mg by mouth daily.    Past Week   magnesium  oxide (MAG-OX) 400 MG tablet Take 400 mg by mouth daily.   Past Week   metFORMIN  (GLUCOPHAGE ) 500 MG tablet Take 500 mg by mouth 3 (three) times daily.   Past Week   methocarbamol  (ROBAXIN ) 500 MG tablet Take 500 mg by mouth 2 (two) times daily.   Past Week   omeprazole (PRILOSEC) 20 MG capsule Take 20 mg by mouth daily.   Past Week   pregabalin  (LYRICA ) 150 MG capsule  TAKE 1 CAPSULE BY MOUTH THREE TIMES DAILY 90 capsule 5 Past Week   risperiDONE  (RISPERDAL ) 0.5 MG tablet Take 0.5 mg by mouth at bedtime.   Past Week   spironolactone (ALDACTONE) 25 MG tablet Take 0.5 tablets by mouth daily.   Past Week   tiZANidine  (ZANAFLEX ) 2 MG tablet TAKE 1 TABLET BY MOUTH AT BEDTIME AS NEEDED FOR MUSCLE SPASMS 30 tablet 2 Unknown   traMADol  (ULTRAM ) 50 MG tablet Take 50 mg by mouth 4 (four) times daily.   Past Week   traZODone  (DESYREL ) 100 MG tablet Take 200 mg by mouth at bedtime.    Past Week   blood glucose meter kit and supplies Dispense based on patient and insurance preference. Use up to four times daily as directed. (FOR ICD-10 E10.9, E11.9). 1 each 0    diazepam  (VALIUM ) 5 MG tablet Take 5 mg by mouth 2 (two) times daily.  (Patient not taking: Reported on 10/17/2023)   Not Taking   HYDROcodone -acetaminophen  (NORCO/VICODIN) 5-325 MG tablet Take 1 tablet by mouth every 6 (six) hours as needed for severe pain. (Patient not taking: Reported on 10/17/2023) 20 tablet 0 Not Taking    Family History  Problem Relation Age of Onset   Lung cancer Maternal Grandmother    Heart attack Maternal Grandfather    Diabetes Paternal Grandfather      Review of Systems:      Cardiac Review of Systems: Y or  [    ]= no  Chest Pain [yes, now resolved]  Resting SOB [   ] Exertional SOB  [  ]  Orthopnea [  ]   Pedal Edema [   ]    Palpitations [  ] Syncope  [  ]   Presyncope [   ]  General Review of Systems: [Y] = yes [  ]=no Constitional: recent weight change [  ]; anorexia [  ]; fatigue [  ]; nausea [  ]; night sweats [  ]; fever [  ];  or chills [  ]                                                               Dental: Last Dentist visit: Edentulous  Eye : blurred vision [  ]; diplopia [   ]; vision changes [  ];  Amaurosis fugax[  ]; Resp: cough [  ];  wheezing[  ];  hemoptysis[  ]; shortness of breath[ x ]; paroxysmal nocturnal dyspnea[  ]; dyspnea on exertion[  ]; or orthopnea[   ];  GI:  gallstones[  ], vomiting[  ];  dysphagia[  ]; melena[  ];  hematochezia [  ]; heartburn[  ];   Hx of  Colonoscopy[  ]; GU: kidney stones [  ]; hematuria[  ];   dysuria [  ];  nocturia[  ];  history of     obstruction [  ]; urinary frequency [  ]             Skin: rash, swelling[  ];, hair loss[  ];  peripheral edema[  ];  or itching[  ]; Musculosketetal: myalgias[  ];  joint swelling[  ];  joint erythema[  ];  joint pain[  ];  back pain[  ];  Heme/Lymph: bruising[  ];  bleeding[  ];  anemia[  ];  Neuro: TIA[  ];  headaches[  ];  stroke[ x3 ];  vertigo[  ];  seizures[  x];   paresthesias[  ];  difficulty walking[  ];  Psych:depression[  ]; anxiety[  ]; bipolar disorder  Endocrine: diabetes[type II, since age 26];  thyroid dysfunction[  ];              Physical Exam: BP 122/74 (BP Location: Left Arm)   Pulse 82   Temp 98.5 F (36.9 C) (Oral)   Resp 16   Ht 5\' 5"  (1.651 m)   Wt 97.3 kg   SpO2 92%   BMI 35.70 kg/m    General appearance: alert, cooperative, and no distress.   Head: Normocephalic, without obvious abnormality, atraumatic Neck: no adenopathy, no carotid bruit, no JVD, and supple, symmetrical, trachea midline.  There is a well-healed tracheostomy scar Lymph nodes: No cervical or clavicular adenopathy Resp: Normal work of breathing on room air, breath sounds are clear to auscultation bilaterally Cardio: Distant heart sounds, normal S1-S2.  No murmur.  Monitor shows normal sinus rhythm GI: Soft, no tenderness Extremities: Status post amputation of left 4th and 5th toes.  Has palpable peripheral pulses, left foot pulses are 3-4+.  I did not palpate her right dorsalis pedis pulse but there is 1+ posterior tibial.  Modified Allen's test was performed on both upper extremities using pulse oximetry.  Wave forms were well-preserved with compression of both radial arteries while the pulse oximetry probe was applied to each of the thumbs. Neurologic: Grossly  normal  Diagnostic Studies & Laboratory data:  LEFT HEART CATH AND CORONARY ANGIOGRAPHY   Conclusion      Dist LM lesion is 50% stenosed.   Prox LAD lesion is 80% stenosed.   1st Diag lesion is 70% stenosed.   Mid LAD lesion is 40% stenosed.   Ost Cx to Prox Cx lesion is 80% stenosed.   Mid Cx lesion is 99% stenosed.  Dist Cx lesion is 99% stenosed.   Dist RCA lesion is 80% stenosed.   Prox RCA lesion is 65% stenosed.   Mid RCA lesion is 50% stenosed.   1.  Severe multivessel disease. 2.  LVEDP of 38 mmHg.   Recommendation: Intensive diuresis and cardiothoracic surgical evaluation.  Coronary Findings  Diagnostic Dominance: Right Left Main  Dist LM lesion is 50% stenosed.    Left Anterior Descending  Prox LAD lesion is 80% stenosed.  Mid LAD lesion is 40% stenosed.    First Diagonal Branch  1st Diag lesion is 70% stenosed.    Left Circumflex  Ost Cx to Prox Cx lesion is 80% stenosed.  Mid Cx lesion is 99% stenosed.  Dist Cx lesion is 99% stenosed.    Right Coronary Artery  There is moderate diffuse disease throughout the vessel.  Prox RCA lesion is 65% stenosed.  Mid RCA lesion is 50% stenosed.  Dist RCA lesion is 80% stenosed.    Intervention   No interventions have been documented.   Coronary Diagrams  Diagnostic Dominance: Right       ECHOCARDIOGRAM REPORT       Patient Name:   Taylor [name] Date of Exam: 10/20/2023  Medical Rec #:  [id number]     Height:       65.0 in  Accession #:    [id number]    Weight:       213.4 lb  Date of Birth:  [birth year]      BSA:          2.033 m  Patient Age:    43 years      BP:           130/79 mmHg  Patient Gender: F             HR:           74 bpm.  Exam Location:  Inpatient   Procedure: 2D Echo (Both Spectral and Color Flow Doppler were utilized  during            procedure).   Indications:    CAD of native vessel    History:        Patient has no prior history of Echocardiogram  examinations.                  CAD, PAD, Signs/Symptoms:Chest Pain; Risk  Factors:Hypertension.    Sonographer:   Dione Franks RDCS  Referring Phys: [id number] Demerius Podolak C Yovanni Frenette   IMPRESSIONS     1. Septal , mid/basal inferior wall and lateral wall hypokinesis . Left  ventricular ejection fraction, by estimation, is 35 to 40%. The left  ventricle has moderately decreased function. The left ventricle  demonstrates regional wall motion abnormalities  (see scoring diagram/findings for description). The left ventricular  internal cavity size was mildly dilated. Left ventricular diastolic  parameters are consistent with Grade II diastolic dysfunction  (pseudonormalization). Elevated left ventricular  end-diastolic pressure.   2. Right ventricular systolic function is normal. The right ventricular  size is normal.   3. Left atrial size was moderately dilated.   4. Moderate subvalvular calcification of the posterior leaflet chords .  The mitral valve is abnormal. Trivial mitral valve regurgitation. No  evidence of mitral stenosis.   5. The aortic valve is tricuspid. Aortic valve regurgitation is not  visualized. No aortic stenosis is present.   6. The inferior vena cava is normal in size with greater than 50%  respiratory  variability, suggesting right atrial pressure of 3 mmHg.   FINDINGS   Left Ventricle: Septal , mid/basal inferior wall and lateral wall  hypokinesis. Left ventricular ejection fraction, by estimation, is 35 to  40%. The left ventricle has moderately decreased function. The left  ventricle demonstrates regional wall motion  abnormalities. Strain was performed and the global longitudinal strain is  indeterminate. The left ventricular internal cavity size was mildly  dilated. There is no left ventricular hypertrophy. Left ventricular  diastolic parameters are consistent with  Grade II diastolic dysfunction (pseudonormalization). Elevated left  ventricular end-diastolic pressure.   Right  Ventricle: The right ventricular size is normal. No increase in  right ventricular wall thickness. Right ventricular systolic function is  normal.   Left Atrium: Left atrial size was moderately dilated.   Right Atrium: Right atrial size was normal in size.   Pericardium: Trivial pericardial effusion is present. The pericardial  effusion is posterior to the left ventricle.   Mitral Valve: Moderate subvalvular calcification of the posterior leaflet  chords. The mitral valve is abnormal. There is mild thickening of the  mitral valve leaflet(s). There is mild calcification of the mitral valve  leaflet(s). Mild mitral annular  calcification. Trivial mitral valve regurgitation. No evidence of mitral  valve stenosis.   Tricuspid Valve: The tricuspid valve is normal in structure. Tricuspid  valve regurgitation is trivial. No evidence of tricuspid stenosis.   Aortic Valve: The aortic valve is tricuspid. Aortic valve regurgitation is  not visualized. No aortic stenosis is present.   Pulmonic Valve: The pulmonic valve was normal in structure. Pulmonic valve  regurgitation is not visualized. No evidence of pulmonic stenosis.   Aorta: The aortic root is normal in size and structure.   Venous: The inferior vena cava is normal in size with greater than 50%  respiratory variability, suggesting right atrial pressure of 3 mmHg.   IAS/Shunts: No atrial level shunt detected by color flow Doppler.   Additional Comments: 3D was performed not requiring image post processing  on an independent workstation and was indeterminate.     LEFT VENTRICLE  PLAX 2D  LVIDd:         5.60 cm     Diastology  LVIDs:         4.50 cm     LV e' medial:    5.77 cm/s  LV PW:         1.10 cm     LV E/e' medial:  17.2  LV IVS:        1.00 cm     LV e' lateral:   6.09 cm/s  LVOT diam:     2.20 cm     LV E/e' lateral: 16.3  LV SV:         65  LV SV Index:   32  LVOT Area:     3.80 cm    LV Volumes (MOD)  LV vol  d, MOD A4C: 98.2 ml  LV vol s, MOD A4C: 63.8 ml  LV SV MOD A4C:     98.2 ml   RIGHT VENTRICLE             IVC  RV Basal diam:  2.50 cm     IVC diam: 1.70 cm  RV S prime:     13.80 cm/s  TAPSE (M-mode): 2.4 cm   LEFT ATRIUM             Index        RIGHT  ATRIUM           Index  LA diam:        5.00 cm 2.46 cm/m   RA Area:     12.10 cm  LA Vol (A2C):   43.9 ml 21.59 ml/m  RA Volume:   26.50 ml  13.03 ml/m  LA Vol (A4C):   88.4 ml 43.47 ml/m  LA Biplane Vol: 66.0 ml 32.46 ml/m   AORTIC VALVE  LVOT Vmax:   78.50 cm/s  LVOT Vmean:  52.800 cm/s  LVOT VTI:    0.170 m    AORTA  Ao Root diam: 2.90 cm  Ao Asc diam:  2.70 cm   MITRAL VALVE  MV Area (PHT): 4.39 cm    SHUNTS  MV Decel Time: 173 msec    Systemic VTI:  0.17 m  MV E velocity: 99.40 cm/s  Systemic Diam: 2.20 cm  MV A velocity: 66.30 cm/s  MV E/A ratio:  1.50   Janelle Mediate MD  Electronically signed by Janelle Mediate MD  Signature Date/Time: 10/20/2023/4:52:13 PM        ADDENDUM REPORT: 10/19/2023 14:46   EXAM: OVER-READ INTERPRETATION  CT CHEST   The following report is an over-read performed by radiologist Dr. Kriss Peter [facility] on 10/19/2023. This over-read does not include interpretation of cardiac or coronary anatomy or pathology. The coronary CTA interpretation by the cardiologist is attached.   COMPARISON:  Chest radiograph dated 07/30/2019.   FINDINGS: The visualized thoracic aorta and pulmonary arteries appear unremarkable.   No hilar or mediastinal adenopathy identified. The visualized esophagus is unremarkable.   The visualized lungs are clear.   No acute findings in the visualized upper abdomen.   No acute osseous pathology.   IMPRESSION: No acute extracardiac findings.     Electronically Signed   By: Angus Bark M.D.   On: 10/19/2023 14:46     I have independently reviewed the above radiologic studies and discussed with the patient   Recent Lab  Findings: Lab Results  Component Value Date   WBC 16.5 (H) 10/21/2023   HGB 8.9 (L) 10/21/2023   HCT 30.8 (L) 10/21/2023   PLT 212 10/21/2023   GLUCOSE 519 (HH) 10/20/2023   CHOL 136 10/18/2023   TRIG 150 (H) 10/18/2023   HDL 59 10/18/2023   LDLCALC 47 10/18/2023   ALT 26 10/17/2023   AST 36 10/17/2023   NA 133 (L) 10/20/2023   K 4.8 10/20/2023   CL 96 (L) 10/20/2023   CREATININE 1.28 (H) 10/20/2023   BUN 30 (H) 10/20/2023   CO2 27 10/20/2023   TSH 0.725 10/17/2023   INR 1.0 02/04/2022   HGBA1C 8.0 (H) 10/17/2023      Assessment / Plan:      - Coronary artery disease: 43 year old female found to have severe three-vessel coronary artery disease after presenting with angina pectoris.  She has chronic systolic heart failure with ejection fraction of 40 to 45%.  There is extensive coronary artery calcification seen on the coronary CT and that is also evident on the left heart catheterization.  Dr. Luna Salinas will see [name] and review clinical data. Recommendations regarding potential surgical revascularization will follow.  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  She has easily palpable pulses in the left foot but diminished pulses on the right.  She has been on Plavix .  Her last dose was 10/20/2023  -Cerebrovascular disease: History of  CVA in 2013 with subsequent seizure disorder treated with Keppra .  Cerebral angiography done in 2020 showed a high-grade left vertebral stenosis and bilateral 50% internal carotid artery stenoses.  Will obtain carotid Doppler studies if CABG is anticipated  -Type 2 diabetes mellitus: Last hemoglobin A1c 7.8.  - Acute kidney injury: Apparently developed after receiving IV contrast.  Creatinine this morning 0.91.   I  spent 20 minutes counseling the patient face to face.   Leata Providence, PA-C  10/21/2023 10:53 AM  I have seen and examined [name].  I reviewed her records and  catheterization images.  43 year old woman with numerous medical issues including morbid obesity, poorly controlled complicated type 2 diabetes, PAD, CVA x 3, seizures, memory loss, congestive heart failure and now three-vessel coronary disease.  Acute kidney injury during this admission resolved.  Presented to ED at [facility] with atypical chest pain, right-sided back pain and shortness of breath.  Found to have elevated high-sensitivity troponin at 86.  proBNP also elevated at 336.  Transferred to [facility].  Coronary CT showed severe three-vessel disease.  She was also treated empirically with antibiotics for possible pneumonia.  Yesterday she underwent cardiac catheterization which revealed severe three-vessel coronary disease with diffusely diseased vessels and poor quality targets.  LVEDP elevated at 38 mmHg.  EF 35 to 40% by echocardiogram.  She is a poor surgical candidate with multiple significant comorbidities.  She would be at extremely high risk for incomplete revascularization.  In fact the areas that are most likely responsible for chest pain are the distal right circulation and distal circumflex circulation neither of which appear to have a graftable target.  Will need to discuss with cardiology.  Milon Aloe Luna Salinas, MD [facility] [phone]

## 2023-10-22 DIAGNOSIS — I25110 Atherosclerotic heart disease of native coronary artery with unstable angina pectoris: Secondary | ICD-10-CM

## 2023-10-22 DIAGNOSIS — I25119 Atherosclerotic heart disease of native coronary artery with unspecified angina pectoris: Secondary | ICD-10-CM | POA: Diagnosis not present

## 2023-10-22 LAB — BASIC METABOLIC PANEL WITH GFR
Anion gap: 12 (ref 5–15)
BUN: 30 mg/dL — ABNORMAL HIGH (ref 6–20)
CO2: 29 mmol/L (ref 22–32)
Calcium: 9.1 mg/dL (ref 8.9–10.3)
Chloride: 97 mmol/L — ABNORMAL LOW (ref 98–111)
Creatinine, Ser: 0.98 mg/dL (ref 0.44–1.00)
GFR, Estimated: >60 mL/min (ref >60)
Glucose, Bld: 241 mg/dL — ABNORMAL HIGH (ref 70–99)
Potassium: 3.6 mmol/L (ref 3.5–5.1)
Sodium: 138 mmol/L (ref 135–145)

## 2023-10-22 LAB — CBC
HCT: 30.8 % — ABNORMAL LOW (ref 36.0–46.0)
Hemoglobin: 8.9 g/dL — ABNORMAL LOW (ref 12.0–15.0)
MCH: 20.3 pg — ABNORMAL LOW (ref 26.0–34.0)
MCHC: 28.9 g/dL — ABNORMAL LOW (ref 30.0–36.0)
MCV: 70.2 fL — ABNORMAL LOW (ref 80.0–100.0)
Platelets: 224 10*3/uL (ref 150–400)
RBC: 4.39 MIL/uL (ref 3.87–5.11)
RDW: 18.6 % — ABNORMAL HIGH (ref 11.5–15.5)
WBC: 13.2 10*3/uL — ABNORMAL HIGH (ref 4.0–10.5)
nRBC: 0 % (ref 0.0–0.2)

## 2023-10-22 LAB — GLUCOSE, CAPILLARY
Glucose-Capillary: 207 mg/dL — ABNORMAL HIGH (ref 70–99)
Glucose-Capillary: 95 mg/dL (ref 70–99)
Glucose-Capillary: 234 mg/dL — ABNORMAL HIGH (ref 70–99)
Glucose-Capillary: 195 mg/dL — ABNORMAL HIGH (ref 70–99)
Glucose-Capillary: 231 mg/dL — ABNORMAL HIGH (ref 70–99)

## 2023-10-22 LAB — HEPARIN LEVEL (UNFRACTIONATED): Heparin Unfractionated: 0.6 [IU]/mL (ref 0.30–0.70)

## 2023-10-22 NOTE — Progress Notes (Signed)
 [facility] CONSULT PROGRESS NOTE    Progress Note  Taylor [name]  UJW:[id number] DOB: 1980-12-03 DOA: 10/17/2023 PCP: Theoplis Fix, MD   Brief Narrative:   Taylor [name] is an 43 y.o. female past medical history of diabetes mellitus type 2, essential hypertension, history of CVA with short-term memory problems, history of seizure and strokes comes in for [facility] for intermittent chest pain that started the day prior to admission   Assessment/Plan:   Acute respiratory failure probably with hypoxia 2/2 Lobar Pneumonia: CTA done at [facility] for elevated D-dimer showed left lower lobe and right lung base infiltrate with a elevated white blood cell count admission Continue current antibiotic regimen complete 7-day course of antibiotics.  Unstable angina: Left heart cath the of her coronary showed diffuse plaque throughout LAD circumflex and RCA. Cardiothoracic surgery relates she is a poor candidate for surgical intervention though will have to discuss with cardiology. Continue aspirin , Plavix  statins and beta-blockers. Further management per cardiology and atypical chest pain. Continue medical management.  Acute kidney injury: Hold Farxiga , ACE inhibitor and NSAIDs, she was just given contrast. With a baseline creatinine of 0.8. Creatinine has returned to baseline with conservative management.  Chronic systolic heart failure: Echo in April 2025 showed an EF of 45%. Farxiga  and ACE inhibitor were held due to acute kidney injury. Continue metoprolol .  Type II diabetes mellitus (HCC): Last A1c of 7.8.  She is not off steroids to help with the management of her diabetes. Continue long-acting insulin  plus sliding scale blood glucose elevated, she is on prednisone  for her allergy to contrast.   Blood glucose still running high increase long-acting insulin  continue sliding scale CBGs AC and at bedtime.  Microcytic anemia: Patient is on Eliquis, hemoglobin has  remained relatively stable at 8.9, MCV is low likely iron  deficient. Ferritin of 25, iron  of 24 TIBC of 427 saturation of 6%, started on oral iron .  Depressive disorder: Continue Prozac  and trazodone .  PAD (peripheral artery disease) (HCC) Continue aspirin , Plavix  statins and beta-blockers.  Essential hypertension: ACE inhibitor were held due to borderline blood pressure. Blood pressure is stable.    At high risk for falls: Will need PT  Seizures (HCC) Continue aspiration precaution and Keppra .   DVT prophylaxis: IV heparin  Family Communication:none Status is: Observation The patient will require care spanning > 2 midnights and should be moved to inpatient because: Acute respiratory failure with hypoxia possibly due to acute community-acquired pneumonia    Code Status:     Code Status Orders  (From admission, onward)           Start     Ordered   10/17/23 1600  Full code  Continuous       Question:  By:  Answer:  Consent: discussion documented in EHR   10/17/23 1602           Code Status History     Date Active Date Inactive Code Status Order ID Comments User Context   02/04/2022 1429 02/09/2022 1653 Full Code [id number]  Ron Cobbs, PA-C Inpatient   02/01/2022 0920 02/01/2022 1836 Full Code [id number]  Margherita Shell, MD Inpatient   04/01/2020 1830 04/02/2020 0117 Full Code [id number]  Young Hensen, MD Inpatient   10/22/2019 1324 10/22/2019 2114 Full Code [id number]  Margherita Shell, MD Inpatient   06/23/2019 1852 07/05/2019 2138 Full Code [id number]  Gattis Kass, MD Inpatient   02/20/2019 1842 02/21/2019 2014 Full Code [id number]  Luellen Sages, MD  Inpatient      Advance Directive Documentation    Flowsheet Row Most Recent Value  Type of Advance Directive Healthcare Power of Attorney  Pre-existing out of facility DNR order (yellow form or pink MOST form) --  "MOST" Form in Place? --         IV Access:   Peripheral IV   Procedures  and diagnostic studies:   ECHOCARDIOGRAM COMPLETE Result Date: 10/20/2023    ECHOCARDIOGRAM REPORT   Patient Name:   Taylor [name] Date of Exam: 10/20/2023 Medical Rec #:  [id number]     Height:       65.0 in Accession #:    [id number]    Weight:       213.4 lb Date of Birth:  02-16-1981      BSA:          2.033 m Patient Age:    43 years      BP:           130/79 mmHg Patient Gender: F             HR:           74 bpm. Exam Location:  Inpatient Procedure: 2D Echo (Both Spectral and Color Flow Doppler were utilized during            procedure). Indications:    CAD of native vessel  History:        Patient has no prior history of Echocardiogram examinations.                 CAD, PAD, Signs/Symptoms:Chest Pain; Risk Factors:Hypertension.  Sonographer:    Dione Franks RDCS Referring Phys: [id number] STEVEN C HENDRICKSON IMPRESSIONS  1. Septal , mid/basal inferior wall and lateral wall hypokinesis . Left ventricular ejection fraction, by estimation, is 35 to 40%. The left ventricle has moderately decreased function. The left ventricle demonstrates regional wall motion abnormalities (see scoring diagram/findings for description). The left ventricular internal cavity size was mildly dilated. Left ventricular diastolic parameters are consistent with Grade II diastolic dysfunction (pseudonormalization). Elevated left ventricular end-diastolic pressure.  2. Right ventricular systolic function is normal. The right ventricular size is normal.  3. Left atrial size was moderately dilated.  4. Moderate subvalvular calcification of the posterior leaflet chords . The mitral valve is abnormal. Trivial mitral valve regurgitation. No evidence of mitral stenosis.  5. The aortic valve is tricuspid. Aortic valve regurgitation is not visualized. No aortic stenosis is present.  6. The inferior vena cava is normal in size with greater than 50% respiratory variability, suggesting right atrial pressure of 3 mmHg. FINDINGS  Left Ventricle: Septal  , mid/basal inferior wall and lateral wall hypokinesis. Left ventricular ejection fraction, by estimation, is 35 to 40%. The left ventricle has moderately decreased function. The left ventricle demonstrates regional wall motion abnormalities. Strain was performed and the global longitudinal strain is indeterminate. The left ventricular internal cavity size was mildly dilated. There is no left ventricular hypertrophy. Left ventricular diastolic parameters are consistent with Grade II diastolic dysfunction (pseudonormalization). Elevated left ventricular end-diastolic pressure. Right Ventricle: The right ventricular size is normal. No increase in right ventricular wall thickness. Right ventricular systolic function is normal. Left Atrium: Left atrial size was moderately dilated. Right Atrium: Right atrial size was normal in size. Pericardium: Trivial pericardial effusion is present. The pericardial effusion is posterior to the left ventricle. Mitral Valve: Moderate subvalvular calcification of the posterior leaflet chords. The mitral valve is abnormal. There is  mild thickening of the mitral valve leaflet(s). There is mild calcification of the mitral valve leaflet(s). Mild mitral annular calcification. Trivial mitral valve regurgitation. No evidence of mitral valve stenosis. Tricuspid Valve: The tricuspid valve is normal in structure. Tricuspid valve regurgitation is trivial. No evidence of tricuspid stenosis. Aortic Valve: The aortic valve is tricuspid. Aortic valve regurgitation is not visualized. No aortic stenosis is present. Pulmonic Valve: The pulmonic valve was normal in structure. Pulmonic valve regurgitation is not visualized. No evidence of pulmonic stenosis. Aorta: The aortic root is normal in size and structure. Venous: The inferior vena cava is normal in size with greater than 50% respiratory variability, suggesting right atrial pressure of 3 mmHg. IAS/Shunts: No atrial level shunt detected by color flow  Doppler. Additional Comments: 3D was performed not requiring image post processing on an independent workstation and was indeterminate.  LEFT VENTRICLE PLAX 2D LVIDd:         5.60 cm     Diastology LVIDs:         4.50 cm     LV e' medial:    5.77 cm/s LV PW:         1.10 cm     LV E/e' medial:  17.2 LV IVS:        1.00 cm     LV e' lateral:   6.09 cm/s LVOT diam:     2.20 cm     LV E/e' lateral: 16.3 LV SV:         65 LV SV Index:   32 LVOT Area:     3.80 cm  LV Volumes (MOD) LV vol d, MOD A4C: 98.2 ml LV vol s, MOD A4C: 63.8 ml LV SV MOD A4C:     98.2 ml RIGHT VENTRICLE             IVC RV Basal diam:  2.50 cm     IVC diam: 1.70 cm RV S prime:     13.80 cm/s TAPSE (M-mode): 2.4 cm LEFT ATRIUM             Index        RIGHT ATRIUM           Index LA diam:        5.00 cm 2.46 cm/m   RA Area:     12.10 cm LA Vol (A2C):   43.9 ml 21.59 ml/m  RA Volume:   26.50 ml  13.03 ml/m LA Vol (A4C):   88.4 ml 43.47 ml/m LA Biplane Vol: 66.0 ml 32.46 ml/m  AORTIC VALVE LVOT Vmax:   78.50 cm/s LVOT Vmean:  52.800 cm/s LVOT VTI:    0.170 m  AORTA Ao Root diam: 2.90 cm Ao Asc diam:  2.70 cm MITRAL VALVE MV Area (PHT): 4.39 cm    SHUNTS MV Decel Time: 173 msec    Systemic VTI:  0.17 m MV E velocity: 99.40 cm/s  Systemic Diam: 2.20 cm MV A velocity: 66.30 cm/s MV E/A ratio:  1.50 Janelle Mediate MD Electronically signed by Janelle Mediate MD Signature Date/Time: 10/20/2023/4:52:13 PM    Final    CARDIAC CATHETERIZATION Result Date: 10/20/2023   Dist LM lesion is 50% stenosed.   Prox LAD lesion is 80% stenosed.   1st Diag lesion is 70% stenosed.   Mid LAD lesion is 40% stenosed.   Ost Cx to Prox Cx lesion is 80% stenosed.   Mid Cx lesion is 99% stenosed.   Dist Cx lesion is 99% stenosed.  Dist RCA lesion is 80% stenosed.   Prox RCA lesion is 65% stenosed.   Mid RCA lesion is 50% stenosed. 1.  Severe multivessel disease. 2.  LVEDP of 38 mmHg. Recommendation: Intensive diuresis and cardiothoracic surgical evaluation.     Medical  Consultants:   None.   Subjective:    HARMON JEFFERIES pain is improved.  Objective:    Vitals:   10/21/23 2333 10/22/23 0207 10/22/23 0500 10/22/23 0748  BP: 110/68 101/65  (!) 86/64  Pulse: 88 82    Resp: 14 18  16   Temp: 98.2 F (36.8 C) 97.8 F (36.6 C)  97.7 F (36.5 C)  TempSrc: Oral Oral  Oral  SpO2: 94% 95%  97%  Weight:   97.1 kg   Height:       SpO2: 97 % O2 Flow Rate (L/min): 2 L/min   Intake/Output Summary (Last 24 hours) at 10/22/2023 0905 Last data filed at 10/22/2023 0454 Gross per 24 hour  Intake 2654.39 ml  Output 1600 ml  Net 1054.39 ml   Filed Weights   10/20/23 0608 10/21/23 0300 10/22/23 0500  Weight: 96.8 kg 97.3 kg 97.1 kg    Exam: General exam: In no acute distress. Respiratory system: Good air movement and clear to auscultation. Cardiovascular system: S1 & S2 heard, RRR. No JVD. Gastrointestinal system: Abdomen is nondistended, soft and nontender.  Extremities: No pedal edema. Skin: No rashes, lesions or ulcers Psychiatry: Judgement and insight appear normal. Mood & affect appropriate.  Data Reviewed:    Labs: Basic Metabolic Panel: Recent Labs  Lab 10/19/23 1642 10/20/23 0309 10/20/23 2325 10/21/23 1010 10/22/23 0218  NA 131* 136 133* 136 138  K 5.3* 4.7 4.8 3.8 3.6  CL 94* 101 96* 98 97*  CO2 25 27 27 27 29   GLUCOSE 461* 254* 519* 285* 241*  BUN 26* 23* 30* 27* 30*  CREATININE 1.37* 0.99 1.28* 0.91 0.98  CALCIUM  9.0 9.8 9.3 9.1 9.1   GFR Estimated Creatinine Clearance: 84.4 mL/min (by C-G formula based on SCr of 0.98 mg/dL). Liver Function Tests: Recent Labs  Lab 10/17/23 1625  AST 36  ALT 26  ALKPHOS 76  BILITOT 0.6  PROT 7.1  ALBUMIN 3.6   No results for input(s): "LIPASE", "AMYLASE" in the last 168 hours. No results for input(s): "AMMONIA" in the last 168 hours. Coagulation profile No results for input(s): "INR", "PROTIME" in the last 168 hours. COVID-19 Labs  No results for input(s): "DDIMER",  "FERRITIN", "LDH", "CRP" in the last 72 hours.   Lab Results  Component Value Date   SARSCOV2NAA NEGATIVE 03/31/2020   SARSCOV2NAA NEGATIVE 10/18/2019   SARSCOV2NAA Not Detected 05/14/2019   SARSCOV2NAA NEGATIVE 03/21/2019    CBC: Recent Labs  Lab 10/18/23 0250 10/19/23 0257 10/20/23 0309 10/21/23 1010 10/22/23 0218  WBC 16.1* 11.2* 18.4* 16.5* 13.2*  HGB 8.9* 8.7* 9.1* 8.9* 8.9*  HCT 30.5* 30.4* 31.6* 30.8* 30.8*  MCV 68.8* 69.2* 69.0* 69.2* 70.2*  PLT 246 215 252 212 224   Cardiac Enzymes: No results for input(s): "CKTOTAL", "CKMB", "CKMBINDEX", "TROPONINI" in the last 168 hours. BNP (last 3 results) No results for input(s): "PROBNP" in the last 8760 hours. CBG: Recent Labs  Lab 10/21/23 1134 10/21/23 1628 10/21/23 1959 10/21/23 2333 10/22/23 0645  GLUCAP 269* 158* 121* 238* 207*   D-Dimer: No results for input(s): "DDIMER" in the last 72 hours. Hgb A1c: No results for input(s): "HGBA1C" in the last 72 hours.  Lipid Profile: No results for  input(s): "CHOL", "HDL", "LDLCALC", "TRIG", "CHOLHDL", "LDLDIRECT" in the last 72 hours.  Thyroid function studies: No results for input(s): "TSH", "T4TOTAL", "T3FREE", "THYROIDAB" in the last 72 hours.  Invalid input(s): "FREET3"  Anemia work up: No results for input(s): "VITAMINB12", "FOLATE", "FERRITIN", "TIBC", "IRON ", "RETICCTPCT" in the last 72 hours.  Sepsis Labs: Recent Labs  Lab 10/19/23 0257 10/20/23 0309 10/21/23 1010 10/22/23 0218  WBC 11.2* 18.4* 16.5* 13.2*   Microbiology Recent Results (from the past 240 hours)  Respiratory (~20 pathogens) panel by PCR     Status: None   Collection Time: 10/17/23  7:55 PM   Specimen: Nasopharyngeal Swab; Respiratory  Result Value Ref Range Status   Adenovirus NOT DETECTED NOT DETECTED Final   Coronavirus 229E NOT DETECTED NOT DETECTED Final    Comment: (NOTE) The Coronavirus on the Respiratory Panel, DOES NOT test for the novel  Coronavirus (2019 nCoV)     Coronavirus HKU1 NOT DETECTED NOT DETECTED Final   Coronavirus NL63 NOT DETECTED NOT DETECTED Final   Coronavirus OC43 NOT DETECTED NOT DETECTED Final   Metapneumovirus NOT DETECTED NOT DETECTED Final   Rhinovirus / Enterovirus NOT DETECTED NOT DETECTED Final   Influenza A NOT DETECTED NOT DETECTED Final   Influenza B NOT DETECTED NOT DETECTED Final   Parainfluenza Virus 1 NOT DETECTED NOT DETECTED Final   Parainfluenza Virus 2 NOT DETECTED NOT DETECTED Final   Parainfluenza Virus 3 NOT DETECTED NOT DETECTED Final   Parainfluenza Virus 4 NOT DETECTED NOT DETECTED Final   Respiratory Syncytial Virus NOT DETECTED NOT DETECTED Final   Bordetella pertussis NOT DETECTED NOT DETECTED Final   Bordetella Parapertussis NOT DETECTED NOT DETECTED Final   Chlamydophila pneumoniae NOT DETECTED NOT DETECTED Final   Mycoplasma pneumoniae NOT DETECTED NOT DETECTED Final    Comment: Performed at [facility], [address] [place], [place] [address]  MRSA Next Gen by PCR, Nasal     Status: None   Collection Time: 10/17/23  7:55 PM   Specimen: Nasal Mucosa; Nasal Swab  Result Value Ref Range Status   MRSA by PCR Next Gen NOT DETECTED NOT DETECTED Final    Comment: (NOTE) The GeneXpert MRSA Assay (FDA approved for NASAL specimens only), is one component of a comprehensive MRSA colonization surveillance program. It is not intended to diagnose MRSA infection nor to guide or monitor treatment for MRSA infections. Test performance is not FDA approved in patients less than 50 years old. Performed at [facility], [address] [place], [place] [address]      Medications:    amoxicillin -clavulanate  1 tablet Oral Q12H   aspirin  EC  81 mg Oral Daily   atorvastatin   80 mg Oral Daily   azithromycin   500 mg Oral Daily   diazepam   5 mg Oral BID   ferrous gluconate   324 mg Oral Daily   FLUoxetine   10 mg Oral Daily   furosemide  40 mg Intravenous BID   insulin  aspart  0-20 Units  Subcutaneous TID WC   insulin  aspart  0-5 Units Subcutaneous QHS   insulin  aspart  8 Units Subcutaneous TID WC   insulin  glargine-yfgn  20 Units Subcutaneous BID   levETIRAcetam   1,000 mg Oral Daily   magnesium  oxide  400 mg Oral Daily   methocarbamol   500 mg Oral BID   metoprolol  succinate  50 mg Oral Daily   pantoprazole   40 mg Oral Daily   pregabalin   150 mg Oral TID  risperiDONE   0.5 mg Oral QHS   sodium chloride  flush  3 mL Intravenous Q12H   traMADol   50 mg Oral QID   traZODone   200 mg Oral QHS   Continuous Infusions:  heparin  1,100 Units/hr (10/22/23 0454)      LOS: 5 days   Macdonald Savoy  [facility]  10/22/2023, 9:05 AM

## 2023-10-22 NOTE — Progress Notes (Signed)
 Progress Note  Patient Name: Taylor [name] Date of Encounter: 10/22/2023  Primary Cardiologist: [name], MD   Subjective   Patient seen and examined at her bedside.  She was asleep on arrival opening eyes to voice.  Inpatient Medications    Scheduled Meds:  amoxicillin -clavulanate  1 tablet Oral Q12H   aspirin  EC  81 mg Oral Daily   atorvastatin   80 mg Oral Daily   azithromycin   500 mg Oral Daily   diazepam   5 mg Oral BID   ferrous gluconate   324 mg Oral Daily   FLUoxetine   10 mg Oral Daily   furosemide  40 mg Intravenous BID   insulin  aspart  0-20 Units Subcutaneous TID WC   insulin  aspart  0-5 Units Subcutaneous QHS   insulin  aspart  8 Units Subcutaneous TID WC   insulin  glargine-yfgn  20 Units Subcutaneous BID   levETIRAcetam   1,000 mg Oral Daily   magnesium  oxide  400 mg Oral Daily   methocarbamol   500 mg Oral BID   metoprolol  succinate  50 mg Oral Daily   pantoprazole   40 mg Oral Daily   pregabalin   150 mg Oral TID   risperiDONE   0.5 mg Oral QHS   sodium chloride  flush  3 mL Intravenous Q12H   traMADol   50 mg Oral QID   traZODone   200 mg Oral QHS   Continuous Infusions:  heparin  1,100 Units/hr (10/22/23 0454)   PRN Meds: acetaminophen , albuterol , HYDROcodone -acetaminophen , morphine  injection, nitroGLYCERIN , ondansetron  (ZOFRAN ) IV, sodium chloride  flush, tiZANidine    Vital Signs    Vitals:   10/21/23 2333 10/22/23 0207 10/22/23 0500 10/22/23 0748  BP: 110/68 101/65  (!) 86/64  Pulse: 88 82    Resp: 14 18  16   Temp: 98.2 F (36.8 C) 97.8 F (36.6 C)  97.7 F (36.5 C)  TempSrc: Oral Oral  Oral  SpO2: 94% 95%  97%  Weight:   97.1 kg   Height:        Intake/Output Summary (Last 24 hours) at 10/22/2023 1020 Last data filed at 10/22/2023 0454 Gross per 24 hour  Intake 2654.39 ml  Output 1600 ml  Net 1054.39 ml   Filed Weights   10/20/23 0608 10/21/23 0300 10/22/23 0500  Weight: 96.8 kg 97.3 kg 97.1 kg    Telemetry    Sinus rhythm-  Personally Reviewed  ECG     - Personally Reviewed  Physical Exam    General: Comfortable Head: Atraumatic, normal size  Eyes: PEERLA, EOMI  Neck: Supple, normal JVD Cardiac: Normal S1, S2; RRR; no murmurs, rubs, or gallops Lungs: Clear to auscultation bilaterally Abd: Soft, nontender, no hepatomegaly  Ext: warm, no edema Musculoskeletal: No deformities, BUE and BLE strength normal and equal Skin: Warm and dry, no rashes   Neuro: Alert and oriented to person, place, time, and situation, CNII-XII grossly intact, no focal deficits  Psych: Normal mood and affect   Labs    Chemistry Recent Labs  Lab 10/17/23 1625 10/18/23 0250 10/20/23 2325 10/21/23 1010 10/22/23 0218  NA 133*   < > 133* 136 138  K 5.5*   < > 4.8 3.8 3.6  CL 93*   < > 96* 98 97*  CO2 26   < > 27 27 29   GLUCOSE 422*   < > 519* 285* 241*  BUN 23*   < > 30* 27* 30*  CREATININE 1.23*   < > 1.28* 0.91 0.98  CALCIUM  9.6   < > 9.3 9.1  9.1  PROT 7.1  --   --   --   --   ALBUMIN 3.6  --   --   --   --   AST 36  --   --   --   --   ALT 26  --   --   --   --   ALKPHOS 76  --   --   --   --   BILITOT 0.6  --   --   --   --   GFRNONAA 56*   < > 53* >60 >60  ANIONGAP 14   < > 10 11 12    < > = values in this interval not displayed.     Hematology Recent Labs  Lab 10/20/23 0309 10/21/23 1010 10/22/23 0218  WBC 18.4* 16.5* 13.2*  RBC 4.58 4.45 4.39  HGB 9.1* 8.9* 8.9*  HCT 31.6* 30.8* 30.8*  MCV 69.0* 69.2* 70.2*  MCH 19.9* 20.0* 20.3*  MCHC 28.8* 28.9* 28.9*  RDW 18.1* 18.5* 18.6*  PLT 252 212 224    Cardiac EnzymesNo results for input(s): "TROPONINI" in the last 168 hours. No results for input(s): "TROPIPOC" in the last 168 hours.   BNP Recent Labs  Lab 10/17/23 1625  BNP 353.2*     DDimer No results for input(s): "DDIMER" in the last 168 hours.   Radiology    ECHOCARDIOGRAM COMPLETE Result Date: 10/20/2023    ECHOCARDIOGRAM REPORT   Patient Name:   Taylor [name] Date of Exam: 10/20/2023  Medical Rec #:  [id number]     Height:       65.0 in Accession #:    [id number]    Weight:       213.4 lb Date of Birth:  November 14, 1980      BSA:          2.033 m Patient Age:    43 years      BP:           130/79 mmHg Patient Gender: F             HR:           74 bpm. Exam Location:  Inpatient Procedure: 2D Echo (Both Spectral and Color Flow Doppler were utilized during            procedure). Indications:    CAD of native vessel  History:        Patient has no prior history of Echocardiogram examinations.                 CAD, PAD, Signs/Symptoms:Chest Pain; Risk Factors:Hypertension.  Sonographer:    Dione Franks RDCS Referring Phys: [id number] STEVEN C HENDRICKSON IMPRESSIONS  1. Septal , mid/basal inferior wall and lateral wall hypokinesis . Left ventricular ejection fraction, by estimation, is 35 to 40%. The left ventricle has moderately decreased function. The left ventricle demonstrates regional wall motion abnormalities (see scoring diagram/findings for description). The left ventricular internal cavity size was mildly dilated. Left ventricular diastolic parameters are consistent with Grade II diastolic dysfunction (pseudonormalization). Elevated left ventricular end-diastolic pressure.  2. Right ventricular systolic function is normal. The right ventricular size is normal.  3. Left atrial size was moderately dilated.  4. Moderate subvalvular calcification of the posterior leaflet chords . The mitral valve is abnormal. Trivial mitral valve regurgitation. No evidence of mitral stenosis.  5. The aortic valve is tricuspid. Aortic valve regurgitation is not visualized. No aortic stenosis is present.  6. The  inferior vena cava is normal in size with greater than 50% respiratory variability, suggesting right atrial pressure of 3 mmHg. FINDINGS  Left Ventricle: Septal , mid/basal inferior wall and lateral wall hypokinesis. Left ventricular ejection fraction, by estimation, is 35 to 40%. The left ventricle has moderately  decreased function. The left ventricle demonstrates regional wall motion abnormalities. Strain was performed and the global longitudinal strain is indeterminate. The left ventricular internal cavity size was mildly dilated. There is no left ventricular hypertrophy. Left ventricular diastolic parameters are consistent with Grade II diastolic dysfunction (pseudonormalization). Elevated left ventricular end-diastolic pressure. Right Ventricle: The right ventricular size is normal. No increase in right ventricular wall thickness. Right ventricular systolic function is normal. Left Atrium: Left atrial size was moderately dilated. Right Atrium: Right atrial size was normal in size. Pericardium: Trivial pericardial effusion is present. The pericardial effusion is posterior to the left ventricle. Mitral Valve: Moderate subvalvular calcification of the posterior leaflet chords. The mitral valve is abnormal. There is mild thickening of the mitral valve leaflet(s). There is mild calcification of the mitral valve leaflet(s). Mild mitral annular calcification. Trivial mitral valve regurgitation. No evidence of mitral valve stenosis. Tricuspid Valve: The tricuspid valve is normal in structure. Tricuspid valve regurgitation is trivial. No evidence of tricuspid stenosis. Aortic Valve: The aortic valve is tricuspid. Aortic valve regurgitation is not visualized. No aortic stenosis is present. Pulmonic Valve: The pulmonic valve was normal in structure. Pulmonic valve regurgitation is not visualized. No evidence of pulmonic stenosis. Aorta: The aortic root is normal in size and structure. Venous: The inferior vena cava is normal in size with greater than 50% respiratory variability, suggesting right atrial pressure of 3 mmHg. IAS/Shunts: No atrial level shunt detected by color flow Doppler. Additional Comments: 3D was performed not requiring image post processing on an independent workstation and was indeterminate.  LEFT VENTRICLE PLAX  2D LVIDd:         5.60 cm     Diastology LVIDs:         4.50 cm     LV e' medial:    5.77 cm/s LV PW:         1.10 cm     LV E/e' medial:  17.2 LV IVS:        1.00 cm     LV e' lateral:   6.09 cm/s LVOT diam:     2.20 cm     LV E/e' lateral: 16.3 LV SV:         65 LV SV Index:   32 LVOT Area:     3.80 cm  LV Volumes (MOD) LV vol d, MOD A4C: 98.2 ml LV vol s, MOD A4C: 63.8 ml LV SV MOD A4C:     98.2 ml RIGHT VENTRICLE             IVC RV Basal diam:  2.50 cm     IVC diam: 1.70 cm RV S prime:     13.80 cm/s TAPSE (M-mode): 2.4 cm LEFT ATRIUM             Index        RIGHT ATRIUM           Index LA diam:        5.00 cm 2.46 cm/m   RA Area:     12.10 cm LA Vol (A2C):   43.9 ml 21.59 ml/m  RA Volume:   26.50 ml  13.03 ml/m LA Vol (A4C):   88.4 ml  43.47 ml/m LA Biplane Vol: 66.0 ml 32.46 ml/m  AORTIC VALVE LVOT Vmax:   78.50 cm/s LVOT Vmean:  52.800 cm/s LVOT VTI:    0.170 m  AORTA Ao Root diam: 2.90 cm Ao Asc diam:  2.70 cm MITRAL VALVE MV Area (PHT): 4.39 cm    SHUNTS MV Decel Time: 173 msec    Systemic VTI:  0.17 m MV E velocity: 99.40 cm/s  Systemic Diam: 2.20 cm MV A velocity: 66.30 cm/s MV E/A ratio:  1.50 Janelle Mediate MD Electronically signed by Janelle Mediate MD Signature Date/Time: 10/20/2023/4:52:13 PM    Final    CARDIAC CATHETERIZATION Result Date: 10/20/2023   Dist LM lesion is 50% stenosed.   Prox LAD lesion is 80% stenosed.   1st Diag lesion is 70% stenosed.   Mid LAD lesion is 40% stenosed.   Ost Cx to Prox Cx lesion is 80% stenosed.   Mid Cx lesion is 99% stenosed.   Dist Cx lesion is 99% stenosed.   Dist RCA lesion is 80% stenosed.   Prox RCA lesion is 65% stenosed.   Mid RCA lesion is 50% stenosed. 1.  Severe multivessel disease. 2.  LVEDP of 38 mmHg. Recommendation: Intensive diuresis and cardiothoracic surgical evaluation.    Cardiac Studies   Echocardiogram, coronary CTA, left heart catheterization  Patient Profile     43 y.o. female female with severe multivessel coronary artery  disease, ischemic cardiomyopathy EF 35 to 40%, peripheral artery disease, hyperlipidemia, diabetes, history of CVA  Assessment & Plan    Severe multivessel coronary artery disease Ischemic cardiomyopathy EF 35 to 40% Hyperlipidemia Diabetes mellitus History of CVA Peripheral artery disease status post left femoral-popliteal artery bypass in 2023  Recent heart catheterization showed severe multivessel disease she was seen by cardiothoracic surgery who deemed the patient to pull surgical candidate.  I am not surprised by this giving her other comorbidities.  Will discuss with our interventional cardiology colleagues for consideration/plan for management/intervention.  In the meantime we will continue the patient on a heparin  drip, aspirin  81 mg daily.  If her hemoglobin hold steady tomorrow plan to load her with Plavix .  Continue the statin as well-LDL at target  Heart failure with reduced ejection fraction-recent echo showed EF of 35 to 40% which is a drop from previous which was 45 to 50%.  ACE inhibitor and Aldactone held earlier this admission due to kidney failure.  Kidney function has resolved, blood blood pressure is marginal so holding off on adding ARB/ARNI and Aldactone for now.  Her ACE inhibitor's have been held over 36 hours so we will go ahead and just move forward with Entresto once her blood pressure can tolerate it.  Hyperlipidemia - continue with current statin medication.  LDL at target.  Peripheral artery disease-continue current medication regimen.  History of CVA continue current medication regimen.  Pneumonia per hospitalist team. Diabetes per hospitalist team.  Discussed plan with the patient.      For questions or updates, please contact [facility] Please consult [email or web address] for contact info under Cardiology/STEMI.      Signed, Neveen Daponte, DO  10/22/2023, 10:20 AM

## 2023-10-22 NOTE — Progress Notes (Signed)
 PHARMACY - ANTICOAGULATION CONSULT NOTE  Pharmacy Consult for heparin  Indication: chest pain/ACS  Allergies  Allergen Reactions   Amitriptyline Other (See Comments)    Unresponsive    Contrast Media [Iodinated Contrast Media] Other (See Comments)    2013-had CVA, went into AFR-combination ABT, contrast, low BP   Morphine  And Codeine Other (See Comments)    Chest pain   Promethazine  Swelling    Lips swell   Compazine Other (See Comments)    -Alert but unable to move   Prochlorperazine Maleate Other (See Comments)    -Alert, but unable to move   Adhesive [Tape] Rash    Long term exposure causes skin to tear    Oxycodone  Nausea And Vomiting    Severe n/v -  patient will not take oxycodone  or percocet.   Sumatriptan Rash    Patient Measurements: Height: 5\' 5"  (165.1 cm) Weight: 97.1 kg (214 lb 1.1 oz) IBW/kg (Calculated) : 57 HEPARIN  DW (KG): 78.4  Vital Signs: Temp: 97.8 F (36.6 C) (05/11 0207) Temp Source: Oral (05/11 0207) BP: 101/65 (05/11 0207) Pulse Rate: 82 (05/11 0207)  Labs: Recent Labs    10/20/23 0309 10/20/23 2325 10/21/23 1010 10/22/23 0218  HGB 9.1*  --  8.9* 8.9*  HCT 31.6*  --  30.8* 30.8*  PLT 252  --  212 224  HEPARINUNFRC  --   --  0.63 0.60  CREATININE 0.99 1.28* 0.91 0.98    Estimated Creatinine Clearance: 84.4 mL/min (by C-G formula based on SCr of 0.98 mg/dL).   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   due to elevated blood sugar 1700-off insulin  6 months   Depression    Diabetes mellitus    off insulin  6 months   Diverticulitis    Dyspnea    when walking   GERD (gastroesophageal reflux disease)    Hypertension    Kidney dialysis status    on dialysis(stroke) off after 4-6 weeks   Pneumonia 2013, 06/2018   Respiratory failure (HCC) 04/2012   with stroke - in hosp  ncbh 12 weeks   Seizures (HCC)    during elvated blood sugsr episode 11/13   Sepsis (HCC)    Stroke (HCC)    11/2018, 12/2018 Weak left side, speech- slurred, Short term memeroy, Gait unsteady    Vocal cord dysfunction     Assessment: 43 yo W with multivessel CAD pending CT Surgery evaluation. No anticoagulation prior to admission. Pharmacy consulted for heparin .    Heparin  level therapeutic at 0.6, CBC low but stable.  Goal of Therapy:  Heparin  level 0.3-0.7 units/ml Monitor platelets by anticoagulation protocol: Yes   Plan:  Continue heparin  1100 units/h Daily heparin  level, CBC  Levin Reamer, PharmD, BCPS, [facility] Clinical Pharmacist [phone] Please check AMION for all [facility] Pharmacy numbers 10/22/2023

## 2023-10-22 NOTE — Plan of Care (Signed)

## 2023-10-23 DIAGNOSIS — I25119 Atherosclerotic heart disease of native coronary artery with unspecified angina pectoris: Secondary | ICD-10-CM | POA: Diagnosis not present

## 2023-10-23 DIAGNOSIS — I2510 Atherosclerotic heart disease of native coronary artery without angina pectoris: Secondary | ICD-10-CM

## 2023-10-23 LAB — BASIC METABOLIC PANEL WITH GFR
Anion gap: 10 (ref 5–15)
BUN: 22 mg/dL — ABNORMAL HIGH (ref 6–20)
CO2: 33 mmol/L — ABNORMAL HIGH (ref 22–32)
Calcium: 9 mg/dL (ref 8.9–10.3)
Chloride: 93 mmol/L — ABNORMAL LOW (ref 98–111)
Creatinine, Ser: 0.81 mg/dL (ref 0.44–1.00)
GFR, Estimated: >60 mL/min (ref >60)
Glucose, Bld: 287 mg/dL — ABNORMAL HIGH (ref 70–99)
Potassium: 4.2 mmol/L (ref 3.5–5.1)
Sodium: 136 mmol/L (ref 135–145)

## 2023-10-23 LAB — CBC
HCT: 32.6 % — ABNORMAL LOW (ref 36.0–46.0)
Hemoglobin: 9.4 g/dL — ABNORMAL LOW (ref 12.0–15.0)
MCH: 20.2 pg — ABNORMAL LOW (ref 26.0–34.0)
MCHC: 28.8 g/dL — ABNORMAL LOW (ref 30.0–36.0)
MCV: 70 fL — ABNORMAL LOW (ref 80.0–100.0)
Platelets: 247 10*3/uL (ref 150–400)
RBC: 4.66 MIL/uL (ref 3.87–5.11)
RDW: 18.6 % — ABNORMAL HIGH (ref 11.5–15.5)
WBC: 13 10*3/uL — ABNORMAL HIGH (ref 4.0–10.5)
nRBC: 0 % (ref 0.0–0.2)

## 2023-10-23 LAB — HEPARIN LEVEL (UNFRACTIONATED): Heparin Unfractionated: 0.36 [IU]/mL (ref 0.30–0.70)

## 2023-10-23 LAB — GLUCOSE, CAPILLARY
Glucose-Capillary: 255 mg/dL — ABNORMAL HIGH (ref 70–99)
Glucose-Capillary: 263 mg/dL — ABNORMAL HIGH (ref 70–99)
Glucose-Capillary: 166 mg/dL — ABNORMAL HIGH (ref 70–99)
Glucose-Capillary: 75 mg/dL (ref 70–99)
Glucose-Capillary: 131 mg/dL — ABNORMAL HIGH (ref 70–99)

## 2023-10-23 MED ORDER — TICAGRELOR 90 MG PO TABS
90.0000 mg | ORAL_TABLET | Freq: Once | ORAL | Status: AC
Start: 2023-10-23 — End: 2023-10-23
  Administered 2023-10-23: 90 mg via ORAL
  Filled 2023-10-23: qty 1

## 2023-10-23 MED ORDER — INSULIN GLARGINE-YFGN 100 UNIT/ML ~~LOC~~ SOLN
25.0000 [IU] | Freq: Two times a day (BID) | SUBCUTANEOUS | Status: DC
  Administered 2023-10-23 – 2023-10-25 (×5): 25 [IU] via SUBCUTANEOUS
  Filled 2023-10-23 (×6): qty 0.25

## 2023-10-23 MED ORDER — TICAGRELOR 90 MG PO TABS
180.0000 mg | ORAL_TABLET | Freq: Two times a day (BID) | ORAL | Status: DC
  Administered 2023-10-23 – 2023-10-24 (×2): 180 mg via ORAL
  Filled 2023-10-23 (×2): qty 2

## 2023-10-23 NOTE — Plan of Care (Signed)
  Problem: Nutrition: Goal: Adequate nutrition will be maintained Outcome: Progressing   Problem: Coping: Goal: Level of anxiety will decrease Outcome: Progressing   Problem: Elimination: Goal: Will not experience complications related to bowel motility Outcome: Progressing Goal: Will not experience complications related to urinary retention Outcome: Progressing   Problem: Pain Managment: Goal: General experience of comfort will improve and/or be controlled Outcome: Progressing   Problem: Safety: Goal: Ability to remain free from injury will improve Outcome: Progressing   Problem: Skin Integrity: Goal: Risk for impaired skin integrity will decrease Outcome: Progressing   Problem: Coping: Goal: Ability to adjust to condition or change in health will improve Outcome: Progressing   Problem: Nutritional: Goal: Maintenance of adequate nutrition will improve Outcome: Progressing Goal: Progress toward achieving an optimal weight will improve Outcome: Progressing   Problem: Skin Integrity: Goal: Risk for impaired skin integrity will decrease Outcome: Progressing

## 2023-10-23 NOTE — Progress Notes (Addendum)
 Progress Note  Patient Name: Taylor [name] Date of Encounter: 10/23/2023  Primary Cardiologist: [name], MD   Subjective   Patient seen and examined at her bedside.    Inpatient Medications    Scheduled Meds:  aspirin  EC  81 mg Oral Daily   atorvastatin   80 mg Oral Daily   azithromycin   500 mg Oral Daily   diazepam   5 mg Oral BID   ferrous gluconate   324 mg Oral Daily   FLUoxetine   10 mg Oral Daily   furosemide  40 mg Intravenous BID   insulin  aspart  0-20 Units Subcutaneous TID WC   insulin  aspart  0-5 Units Subcutaneous QHS   insulin  aspart  8 Units Subcutaneous TID WC   insulin  glargine-yfgn  25 Units Subcutaneous BID   levETIRAcetam   1,000 mg Oral Daily   magnesium  oxide  400 mg Oral Daily   methocarbamol   500 mg Oral BID   metoprolol  succinate  50 mg Oral Daily   pantoprazole   40 mg Oral Daily   pregabalin   150 mg Oral TID   risperiDONE   0.5 mg Oral QHS   sodium chloride  flush  3 mL Intravenous Q12H   traMADol   50 mg Oral QID   traZODone   200 mg Oral QHS   Continuous Infusions:  heparin  1,100 Units/hr (10/23/23 0600)   PRN Meds: acetaminophen , albuterol , HYDROcodone -acetaminophen , morphine  injection, nitroGLYCERIN , ondansetron  (ZOFRAN ) IV, sodium chloride  flush, tiZANidine    Vital Signs    Vitals:   10/22/23 2300 10/23/23 0612 10/23/23 0615 10/23/23 0725  BP: 96/74 117/68  114/68  Pulse:      Resp: 20 15  19   Temp: 97.7 F (36.5 C) 97.6 F (36.4 C)  97.8 F (36.6 C)  TempSrc: Axillary Oral  Oral  SpO2: 97%   97%  Weight: (P) 92.2 kg  92.2 kg   Height:        Intake/Output Summary (Last 24 hours) at 10/23/2023 0850 Last data filed at 10/23/2023 0600 Gross per 24 hour  Intake 755.73 ml  Output 500 ml  Net 255.73 ml   Filed Weights   10/22/23 0500 10/22/23 2300 10/23/23 0615  Weight: 97.1 kg (P) 92.2 kg 92.2 kg    Telemetry    Sinus rhythm- Personally Reviewed  ECG     - Personally Reviewed  Physical Exam    General:  Comfortable Head: Atraumatic, normal size  Eyes: PEERLA, EOMI  Neck: Supple, normal JVD Cardiac: Normal S1, S2; RRR; no murmurs, rubs, or gallops Lungs: Clear to auscultation bilaterally Abd: Soft, nontender, no hepatomegaly  Ext: warm, no edema Musculoskeletal: No deformities, BUE and BLE strength normal and equal Skin: Warm and dry, no rashes   Neuro: Alert and oriented to person, place, time, and situation, CNII-XII grossly intact, no focal deficits  Psych: Normal mood and affect   Labs    Chemistry Recent Labs  Lab 10/17/23 1625 10/18/23 0250 10/20/23 2325 10/21/23 1010 10/22/23 0218  NA 133*   < > 133* 136 138  K 5.5*   < > 4.8 3.8 3.6  CL 93*   < > 96* 98 97*  CO2 26   < > 27 27 29   GLUCOSE 422*   < > 519* 285* 241*  BUN 23*   < > 30* 27* 30*  CREATININE 1.23*   < > 1.28* 0.91 0.98  CALCIUM  9.6   < > 9.3 9.1 9.1  PROT 7.1  --   --   --   --  ALBUMIN 3.6  --   --   --   --   AST 36  --   --   --   --   ALT 26  --   --   --   --   ALKPHOS 76  --   --   --   --   BILITOT 0.6  --   --   --   --   GFRNONAA 56*   < > 53* >60 >60  ANIONGAP 14   < > 10 11 12    < > = values in this interval not displayed.     Hematology Recent Labs  Lab 10/21/23 1010 10/22/23 0218 10/23/23 0228  WBC 16.5* 13.2* 13.0*  RBC 4.45 4.39 4.66  HGB 8.9* 8.9* 9.4*  HCT 30.8* 30.8* 32.6*  MCV 69.2* 70.2* 70.0*  MCH 20.0* 20.3* 20.2*  MCHC 28.9* 28.9* 28.8*  RDW 18.5* 18.6* 18.6*  PLT 212 224 247    Cardiac EnzymesNo results for input(s): "TROPONINI" in the last 168 hours. No results for input(s): "TROPIPOC" in the last 168 hours.   BNP Recent Labs  Lab 10/17/23 1625  BNP 353.2*     DDimer No results for input(s): "DDIMER" in the last 168 hours.   Radiology    No results found.   Cardiac Studies   Echocardiogram, coronary CTA, left heart catheterization  Patient Profile     43 y.o. female female with severe multivessel coronary artery disease, ischemic  cardiomyopathy EF 35 to 40%, peripheral artery disease, hyperlipidemia, diabetes, history of CVA  Assessment & Plan    Severe multivessel coronary artery disease Ischemic cardiomyopathy EF 35 to 40% Hyperlipidemia Diabetes mellitus History of CVA Peripheral artery disease status post left femoral-popliteal artery bypass in 2023  Recent heart catheterization showed severe multivessel disease she was seen by cardiothoracic surgery who deemed the patient to pull surgical candidate.  I am not surprised by this given her other comorbidities.  Discussed with our interventional cardiologist who will review the films again and plan of high risk PCI. Will keep the family and primary team updated on plans.  In the meantime we will continue the patient on a heparin  drip, aspirin  81 mg daily.  If her hemoglobin hold steady tomorrow plan to load her with Plavix .  Continue the statin as well-LDL at target  Heart failure with reduced ejection fraction-recent echo showed EF of 35 to 40% which is a drop from previous which was 45 to 50%.  ACE inhibitor and Aldactone held earlier this admission due to kidney failure.  Kidney function has resolved, blood blood pressure is marginal so holding off on adding ARB/ARNI and Aldactone for now.  Her ACE inhibitor's have been held over 36 hours so we will go ahead and just move forward with Entresto once her blood pressure can tolerate it.  Hyperlipidemia - continue with current statin medication.  LDL at target.  Peripheral artery disease-continue current medication regimen.  History of CVA continue current medication regimen.  Pneumonia per hospitalist team. Diabetes per hospitalist team.  Discussed plan with the patient.  Addendum: Case discussed enervation on team.  The patient will be taking to the Cath Lab tomorrow for high risk PCI.  I was able to speak to her brother Taylor [name].  Unfortunately was not able to speak to her parents at this time.  All of his  questions has been answered. We will keep her Npo past midnight.  For questions or updates, please contact  [facility] Please consult [email or web address] for contact info under Cardiology/STEMI.      Signed, Uziah Sorter, DO  10/23/2023, 8:50 AM

## 2023-10-23 NOTE — Progress Notes (Addendum)
 [facility] CONSULT PROGRESS NOTE    Progress Note  Taylor [name]  ZOX:[id number] DOB: 1981-03-11 DOA: 10/17/2023 PCP: Theoplis Fix, MD   Brief Narrative:   Taylor [name] is an 43 y.o. female past medical history of diabetes mellitus type 2, essential hypertension, history of CVA with short-term memory problems, history of seizure and strokes comes in for [facility] for intermittent chest pain that started the day prior to admission.  Assessment/Plan:   Acute respiratory failure probably with hypoxia 2/2 Lobar Pneumonia: CTA done at [facility] for elevated D-dimer showed left lower lobe and right lung base infiltrate with a elevated white blood cell count admission Continue current antibiotic regimen complete 7-day course of antibiotics.  Unstable angina: Left heart cath the of her coronary showed diffuse plaque throughout LAD circumflex and RCA. Cardiothoracic surgery relates she is a poor candidate for surgical intervention. Cardiology to discuss with internationalist to see if there is any available options. Continue aspirin , Plavix , heparin , statins and beta-blockers. Further management per cardiology and atypical chest pain. Continue medical management.  Acute kidney injury: Hold Farxiga , ACE inhibitor and NSAIDs, she was just given contrast. With a baseline creatinine of 0.8. Creatinine has returned to baseline with conservative management.  Chronic systolic heart failure: Echo in April 2025 showed an EF of 45%. Farxiga  and ACE inhibitor were held due to acute kidney injury. Continue metoprolol .  Type II diabetes mellitus (HCC): Last A1c of 7.8.  She is not off steroids to help with the management of her diabetes. Continue long-acting insulin  plus sliding scale blood glucose elevated, she is on prednisone  for her allergy to contrast.   Blood glucose still running high increase long-acting insulin  continue sliding scale CBGs, AC and at bedtime.  Chronic  back pain: Continue current home regimen.  Microcytic anemia: Patient is on Eliquis, hemoglobin has remained relatively stable at 8.9, MCV is low likely iron  deficient. Ferritin of 25, iron  of 24 TIBC of 427 saturation of 6%, started on oral iron .  Depressive disorder: Continue Prozac  and trazodone .  PAD (peripheral artery disease) (HCC) Continue aspirin , Plavix  statins and beta-blockers.  Essential hypertension: ACE inhibitor were held due to borderline blood pressure. Blood pressure is stable.    At high risk for falls: Will need PT  Seizures (HCC) Continue aspiration precaution and Keppra .   DVT prophylaxis: IV heparin  Family Communication:none Status is: Observation The patient will require care spanning > 2 midnights and should be moved to inpatient because: Acute respiratory failure with hypoxia possibly due to acute community-acquired pneumonia    Code Status:     Code Status Orders  (From admission, onward)           Start     Ordered   10/17/23 1600  Full code  Continuous       Question:  By:  Answer:  Consent: discussion documented in EHR   10/17/23 1602           Code Status History     Date Active Date Inactive Code Status Order ID Comments User Context   02/04/2022 1429 02/09/2022 1653 Full Code [id number]  Ron Cobbs, PA-C Inpatient   02/01/2022 0920 02/01/2022 1836 Full Code [id number]  Margherita Shell, MD Inpatient   04/01/2020 1830 04/02/2020 0117 Full Code [id number]  Young Hensen, MD Inpatient   10/22/2019 1324 10/22/2019 2114 Full Code [id number]  Margherita Shell, MD Inpatient   06/23/2019 1852 07/05/2019 2138 Full Code [id number]  Gattis Kass, MD  Inpatient   02/20/2019 1842 02/21/2019 2014 Full Code [id number]  Luellen Sages, MD Inpatient      Advance Directive Documentation    Flowsheet Row Most Recent Value  Type of Advance Directive Healthcare Power of Attorney  Pre-existing out of facility DNR order (yellow form or  pink MOST form) --  "MOST" Form in Place? --         IV Access:   Peripheral IV   Procedures and diagnostic studies:   No results found.    Medical Consultants:   None.   Subjective:    Taylor [name] continues to have back pain.  Objective:    Vitals:   10/22/23 2300 10/23/23 0612 10/23/23 0615 10/23/23 0725  BP: 96/74 117/68  114/68  Pulse:      Resp: 20 15  19   Temp: 97.7 F (36.5 C) 97.6 F (36.4 C)  97.8 F (36.6 C)  TempSrc: Axillary Oral  Oral  SpO2: 97%   97%  Weight: (P) 92.2 kg  92.2 kg   Height:       SpO2: 97 % O2 Flow Rate (L/min): 2 L/min   Intake/Output Summary (Last 24 hours) at 10/23/2023 0731 Last data filed at 10/23/2023 0600 Gross per 24 hour  Intake 755.73 ml  Output 500 ml  Net 255.73 ml   Filed Weights   10/22/23 0500 10/22/23 2300 10/23/23 0615  Weight: 97.1 kg (P) 92.2 kg 92.2 kg    Exam: General exam: In no acute distress. Respiratory system: Good air movement and clear to auscultation. Cardiovascular system: S1 & S2 heard, RRR. No JVD. Gastrointestinal system: Abdomen is nondistended, soft and nontender.  Extremities: No pedal edema. Skin: No rashes, lesions or ulcers. Psychiatry: Judgement and insight appear normal. Mood & affect appropriate.  Data Reviewed:    Labs: Basic Metabolic Panel: Recent Labs  Lab 10/19/23 1642 10/20/23 0309 10/20/23 2325 10/21/23 1010 10/22/23 0218  NA 131* 136 133* 136 138  K 5.3* 4.7 4.8 3.8 3.6  CL 94* 101 96* 98 97*  CO2 25 27 27 27 29   GLUCOSE 461* 254* 519* 285* 241*  BUN 26* 23* 30* 27* 30*  CREATININE 1.37* 0.99 1.28* 0.91 0.98  CALCIUM  9.0 9.8 9.3 9.1 9.1   GFR Estimated Creatinine Clearance: 82.2 mL/min (by C-G formula based on SCr of 0.98 mg/dL). Liver Function Tests: Recent Labs  Lab 10/17/23 1625  AST 36  ALT 26  ALKPHOS 76  BILITOT 0.6  PROT 7.1  ALBUMIN 3.6   No results for input(s): "LIPASE", "AMYLASE" in the last 168 hours. No results for  input(s): "AMMONIA" in the last 168 hours. Coagulation profile No results for input(s): "INR", "PROTIME" in the last 168 hours. COVID-19 Labs  No results for input(s): "DDIMER", "FERRITIN", "LDH", "CRP" in the last 72 hours.   Lab Results  Component Value Date   SARSCOV2NAA NEGATIVE 03/31/2020   SARSCOV2NAA NEGATIVE 10/18/2019   SARSCOV2NAA Not Detected 05/14/2019   SARSCOV2NAA NEGATIVE 03/21/2019    CBC: Recent Labs  Lab 10/19/23 0257 10/20/23 0309 10/21/23 1010 10/22/23 0218 10/23/23 0228  WBC 11.2* 18.4* 16.5* 13.2* 13.0*  HGB 8.7* 9.1* 8.9* 8.9* 9.4*  HCT 30.4* 31.6* 30.8* 30.8* 32.6*  MCV 69.2* 69.0* 69.2* 70.2* 70.0*  PLT 215 252 212 224 247   Cardiac Enzymes: No results for input(s): "CKTOTAL", "CKMB", "CKMBINDEX", "TROPONINI" in the last 168 hours. BNP (last 3 results) No results for input(s): "PROBNP" in the last 8760 hours. CBG: Recent Labs  Lab  10/22/23 1121 10/22/23 1636 10/22/23 1945 10/22/23 2107 10/23/23 0614  GLUCAP 95 234* 195* 231* 255*   D-Dimer: No results for input(s): "DDIMER" in the last 72 hours. Hgb A1c: No results for input(s): "HGBA1C" in the last 72 hours.  Lipid Profile: No results for input(s): "CHOL", "HDL", "LDLCALC", "TRIG", "CHOLHDL", "LDLDIRECT" in the last 72 hours.  Thyroid function studies: No results for input(s): "TSH", "T4TOTAL", "T3FREE", "THYROIDAB" in the last 72 hours.  Invalid input(s): "FREET3"  Anemia work up: No results for input(s): "VITAMINB12", "FOLATE", "FERRITIN", "TIBC", "IRON ", "RETICCTPCT" in the last 72 hours.  Sepsis Labs: Recent Labs  Lab 10/20/23 0309 10/21/23 1010 10/22/23 0218 10/23/23 0228  WBC 18.4* 16.5* 13.2* 13.0*   Microbiology Recent Results (from the past 240 hours)  Respiratory (~20 pathogens) panel by PCR     Status: None   Collection Time: 10/17/23  7:55 PM   Specimen: Nasopharyngeal Swab; Respiratory  Result Value Ref Range Status   Adenovirus NOT DETECTED NOT DETECTED  Final   Coronavirus 229E NOT DETECTED NOT DETECTED Final    Comment: (NOTE) The Coronavirus on the Respiratory Panel, DOES NOT test for the novel  Coronavirus (2019 nCoV)    Coronavirus HKU1 NOT DETECTED NOT DETECTED Final   Coronavirus NL63 NOT DETECTED NOT DETECTED Final   Coronavirus OC43 NOT DETECTED NOT DETECTED Final   Metapneumovirus NOT DETECTED NOT DETECTED Final   Rhinovirus / Enterovirus NOT DETECTED NOT DETECTED Final   Influenza A NOT DETECTED NOT DETECTED Final   Influenza B NOT DETECTED NOT DETECTED Final   Parainfluenza Virus 1 NOT DETECTED NOT DETECTED Final   Parainfluenza Virus 2 NOT DETECTED NOT DETECTED Final   Parainfluenza Virus 3 NOT DETECTED NOT DETECTED Final   Parainfluenza Virus 4 NOT DETECTED NOT DETECTED Final   Respiratory Syncytial Virus NOT DETECTED NOT DETECTED Final   Bordetella pertussis NOT DETECTED NOT DETECTED Final   Bordetella Parapertussis NOT DETECTED NOT DETECTED Final   Chlamydophila pneumoniae NOT DETECTED NOT DETECTED Final   Mycoplasma pneumoniae NOT DETECTED NOT DETECTED Final    Comment: Performed at [facility], [address], [place], [place] [address]  MRSA Next Gen by PCR, Nasal     Status: None   Collection Time: 10/17/23  7:55 PM   Specimen: Nasal Mucosa; Nasal Swab  Result Value Ref Range Status   MRSA by PCR Next Gen NOT DETECTED NOT DETECTED Final    Comment: (NOTE) The GeneXpert MRSA Assay (FDA approved for NASAL specimens only), is one component of a comprehensive MRSA colonization surveillance program. It is not intended to diagnose MRSA infection nor to guide or monitor treatment for MRSA infections. Test performance is not FDA approved in patients less than 66 years old. Performed at [facility], [address] , [place] [address]      Medications:    aspirin  EC  81 mg Oral Daily   atorvastatin   80 mg Oral Daily   azithromycin   500 mg Oral Daily   diazepam   5 mg Oral BID   ferrous  gluconate  324 mg Oral Daily   FLUoxetine   10 mg Oral Daily   furosemide  40 mg Intravenous BID   insulin  aspart  0-20 Units Subcutaneous TID WC   insulin  aspart  0-5 Units Subcutaneous QHS   insulin  aspart  8 Units Subcutaneous TID WC   insulin  glargine-yfgn  20 Units Subcutaneous BID   levETIRAcetam   1,000 mg Oral Daily   magnesium  oxide  400 mg Oral Daily   methocarbamol   500 mg Oral BID   metoprolol  succinate  50 mg Oral Daily   pantoprazole   40 mg Oral Daily   pregabalin   150 mg Oral TID   risperiDONE   0.5 mg Oral QHS   sodium chloride  flush  3 mL Intravenous Q12H   traMADol   50 mg Oral QID   traZODone   200 mg Oral QHS   Continuous Infusions:  heparin  1,100 Units/hr (10/23/23 0600)      LOS: 6 days   Macdonald Savoy  [facility]  10/23/2023, 7:31 AM

## 2023-10-23 NOTE — Progress Notes (Signed)
 PHARMACY - ANTICOAGULATION CONSULT NOTE  Pharmacy Consult for heparin  Indication: chest pain/ACS  Allergies  Allergen Reactions   Amitriptyline Other (See Comments)    Unresponsive    Contrast Media [Iodinated Contrast Media] Other (See Comments)    2013-had CVA, went into AFR-combination ABT, contrast, low BP   Morphine  And Codeine Other (See Comments)    Chest pain   Promethazine  Swelling    Lips swell   Compazine Other (See Comments)    -Alert but unable to move   Prochlorperazine Maleate Other (See Comments)    -Alert, but unable to move   Adhesive [Tape] Rash    Long term exposure causes skin to tear    Oxycodone  Nausea And Vomiting    Severe n/v -  patient will not take oxycodone  or percocet.   Sumatriptan Rash    Patient Measurements: Height: 5\' 5"  (165.1 cm) Weight: 92.2 kg (203 lb 4.8 oz) (Scale A) IBW/kg (Calculated) : 57 HEPARIN  DW (KG): 78.4  Vital Signs: Temp: 97.8 F (36.6 C) (05/12 0725) Temp Source: Oral (05/12 0725) BP: 114/68 (05/12 0725)  Labs: Recent Labs    10/20/23 2325 10/21/23 1010 10/21/23 1010 10/22/23 0218 10/23/23 0228  HGB  --  8.9*   < > 8.9* 9.4*  HCT  --  30.8*  --  30.8* 32.6*  PLT  --  212  --  224 247  HEPARINUNFRC  --  0.63  --  0.60 0.36  CREATININE 1.28* 0.91  --  0.98  --    < > = values in this interval not displayed.    Estimated Creatinine Clearance: 82.2 mL/min (by C-G formula based on SCr of 0.98 mg/dL).   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   due to elevated blood sugar 1700-off insulin  6 months   Depression    Diabetes mellitus    off insulin  6 months   Diverticulitis    Dyspnea    when walking   GERD (gastroesophageal reflux disease)    Hypertension    Kidney dialysis status    on dialysis(stroke) off after 4-6 weeks   Pneumonia 2013,  06/2018   Respiratory failure (HCC) 04/2012   with stroke - in hosp [facility] 12 weeks   Seizures (HCC)    during elvated blood sugsr episode 11/13   Sepsis (HCC)    Stroke (HCC)    11/2018, 12/2018 Weak left side, speech- slurred, Short term memeroy, Gait unsteady    Vocal cord dysfunction     Assessment: 43 yo W with multivessel CAD pending CT Surgery evaluation. No anticoagulation prior to admission. Pharmacy consulted for heparin .    Heparin  level came back therapeutic this morning at 0.36, on heparin  infusion at 1100 units/hr. Hgb 9.4, plt 247. No s/sx of bleeding or infusion issues.   Goal of Therapy:  Heparin  level 0.3-0.7 units/ml Monitor platelets by anticoagulation protocol: Yes   Plan:  Continue heparin  1100 units/hr Daily heparin  level, CBC  Thank you for allowing pharmacy to participate in this patient's care,  Nieves Bars, PharmD, BCCCP Clinical Pharmacist  Phone: [phone] 10/23/2023 10:07 AM  Please check AMION for all [facility] Pharmacy phone numbers After 10:00 PM, call Main Pharmacy [phone]

## 2023-10-24 ENCOUNTER — Encounter (HOSPITAL_COMMUNITY): Payer: Self-pay | Admitting: Cardiology

## 2023-10-24 ENCOUNTER — Telehealth (HOSPITAL_COMMUNITY): Payer: Self-pay | Admitting: Pharmacy Technician

## 2023-10-24 ENCOUNTER — Other Ambulatory Visit (HOSPITAL_COMMUNITY): Payer: Self-pay

## 2023-10-24 ENCOUNTER — Encounter (HOSPITAL_COMMUNITY)
Admission: AD | Disposition: A | Discharge status: Home / Self Care | Payer: Self-pay | Source: Other Acute Inpatient Hospital | Attending: Internal Medicine

## 2023-10-24 DIAGNOSIS — I25110 Atherosclerotic heart disease of native coronary artery with unstable angina pectoris: Secondary | ICD-10-CM

## 2023-10-24 HISTORY — PX: CORONARY STENT INTERVENTION: CATH118234

## 2023-10-24 LAB — BASIC METABOLIC PANEL WITH GFR
Anion gap: 12 (ref 5–15)
BUN: 26 mg/dL — ABNORMAL HIGH (ref 6–20)
CO2: 29 mmol/L (ref 22–32)
Calcium: 9.6 mg/dL (ref 8.9–10.3)
Chloride: 95 mmol/L — ABNORMAL LOW (ref 98–111)
Creatinine, Ser: 1 mg/dL (ref 0.44–1.00)
GFR, Estimated: >60 mL/min (ref >60)
Glucose, Bld: 213 mg/dL — ABNORMAL HIGH (ref 70–99)
Potassium: 4.1 mmol/L (ref 3.5–5.1)
Sodium: 136 mmol/L (ref 135–145)

## 2023-10-24 LAB — GLUCOSE, CAPILLARY
Glucose-Capillary: 172 mg/dL — ABNORMAL HIGH (ref 70–99)
Glucose-Capillary: 172 mg/dL — ABNORMAL HIGH (ref 70–99)
Glucose-Capillary: 518 mg/dL (ref 70–99)

## 2023-10-24 LAB — CBC
HCT: 31.3 % — ABNORMAL LOW (ref 36.0–46.0)
Hemoglobin: 9.5 g/dL — ABNORMAL LOW (ref 12.0–15.0)
MCH: 21.1 pg — ABNORMAL LOW (ref 26.0–34.0)
MCHC: 30.4 g/dL (ref 30.0–36.0)
MCV: 69.4 fL — ABNORMAL LOW (ref 80.0–100.0)
Platelets: 222 10*3/uL (ref 150–400)
RBC: 4.51 MIL/uL (ref 3.87–5.11)
RDW: 19.2 % — ABNORMAL HIGH (ref 11.5–15.5)
WBC: 14.3 10*3/uL — ABNORMAL HIGH (ref 4.0–10.5)
nRBC: 0 % (ref 0.0–0.2)

## 2023-10-24 LAB — POCT ACTIVATED CLOTTING TIME
Activated Clotting Time: 423 s
Activated Clotting Time: 331 s

## 2023-10-24 LAB — HEPARIN LEVEL (UNFRACTIONATED): Heparin Unfractionated: 0.27 [IU]/mL — ABNORMAL LOW (ref 0.30–0.70)

## 2023-10-24 SURGERY — CORONARY STENT INTERVENTION
Anesthesia: LOCAL

## 2023-10-24 MED ORDER — SODIUM CHLORIDE 0.9 % IV SOLN: INTRAVENOUS | Status: DC

## 2023-10-24 MED ORDER — HEPARIN SODIUM (PORCINE) 5000 UNIT/ML IJ SOLN
5000.0000 [IU] | Freq: Three times a day (TID) | INTRAMUSCULAR | Status: DC
  Administered 2023-10-24 – 2023-10-26 (×5): 5000 [IU] via SUBCUTANEOUS
  Filled 2023-10-24 (×5): qty 1

## 2023-10-24 MED ORDER — VERAPAMIL HCL 2.5 MG/ML IV SOLN
INTRAVENOUS | Status: AC
  Filled 2023-10-24: qty 2

## 2023-10-24 MED ORDER — HEPARIN (PORCINE) IN NACL 2000-0.9 UNIT/L-% IV SOLN
INTRAVENOUS | Status: DC | PRN
  Administered 2023-10-24: 1000 mL

## 2023-10-24 MED ORDER — METHYLPREDNISOLONE SODIUM SUCC 125 MG IJ SOLR
125.0000 mg | Freq: Once | INTRAMUSCULAR | Status: AC
  Administered 2023-10-24: 125 mg via INTRAVENOUS
  Filled 2023-10-24: qty 2

## 2023-10-24 MED ORDER — LIDOCAINE HCL (PF) 1 % IJ SOLN
INTRAMUSCULAR | Status: DC | PRN
  Administered 2023-10-24: 2 mL

## 2023-10-24 MED ORDER — HYDRALAZINE HCL 20 MG/ML IJ SOLN: 10.0000 mg | INTRAMUSCULAR | Status: DC | PRN

## 2023-10-24 MED ORDER — LIDOCAINE HCL (PF) 1 % IJ SOLN
INTRAMUSCULAR | Status: AC
  Filled 2023-10-24: qty 30

## 2023-10-24 MED ORDER — TICAGRELOR 90 MG PO TABS
90.0000 mg | ORAL_TABLET | Freq: Two times a day (BID) | ORAL | Status: DC
  Administered 2023-10-24 – 2023-10-26 (×4): 90 mg via ORAL
  Filled 2023-10-24 (×4): qty 1

## 2023-10-24 MED ORDER — SODIUM CHLORIDE 0.9 % IV SOLN: 250.0000 mL | INTRAVENOUS | Status: AC | PRN

## 2023-10-24 MED ORDER — SODIUM CHLORIDE 0.9% FLUSH
3.0000 mL | Freq: Two times a day (BID) | INTRAVENOUS | Status: DC
  Administered 2023-10-24 – 2023-10-26 (×4): 3 mL via INTRAVENOUS

## 2023-10-24 MED ORDER — MIDAZOLAM HCL 2 MG/2ML IJ SOLN
INTRAMUSCULAR | Status: DC | PRN
  Administered 2023-10-24 (×2): 1 mg via INTRAVENOUS

## 2023-10-24 MED ORDER — SACUBITRIL-VALSARTAN 24-26 MG PO TABS
1.0000 | ORAL_TABLET | Freq: Two times a day (BID) | ORAL | Status: DC
  Administered 2023-10-24 – 2023-10-26 (×4): 1 via ORAL
  Filled 2023-10-24 (×4): qty 1

## 2023-10-24 MED ORDER — DIPHENHYDRAMINE HCL 50 MG/ML IJ SOLN
25.0000 mg | Freq: Once | INTRAMUSCULAR | Status: AC
  Administered 2023-10-24: 25 mg via INTRAVENOUS
  Filled 2023-10-24: qty 1

## 2023-10-24 MED ORDER — LABETALOL HCL 5 MG/ML IV SOLN: 10.0000 mg | INTRAVENOUS | Status: DC | PRN

## 2023-10-24 MED ORDER — HEPARIN SODIUM (PORCINE) 1000 UNIT/ML IJ SOLN
INTRAMUSCULAR | Status: DC | PRN
  Administered 2023-10-24: 10000 [IU] via INTRAVENOUS

## 2023-10-24 MED ORDER — HEPARIN SODIUM (PORCINE) 1000 UNIT/ML IJ SOLN
INTRAMUSCULAR | Status: AC
  Filled 2023-10-24: qty 10

## 2023-10-24 MED ORDER — FENTANYL CITRATE (PF) 100 MCG/2ML IJ SOLN
INTRAMUSCULAR | Status: AC
  Filled 2023-10-24: qty 2

## 2023-10-24 MED ORDER — IOHEXOL 350 MG/ML SOLN
INTRAVENOUS | Status: DC | PRN
  Administered 2023-10-24: 150 mL via INTRA_ARTERIAL

## 2023-10-24 MED ORDER — SODIUM CHLORIDE 0.9 % IV SOLN
INTRAVENOUS | Status: AC | PRN
  Administered 2023-10-24: 250 mL via INTRAVENOUS

## 2023-10-24 MED ORDER — MIDAZOLAM HCL 2 MG/2ML IJ SOLN
INTRAMUSCULAR | Status: AC
  Filled 2023-10-24: qty 2

## 2023-10-24 MED ORDER — VERAPAMIL HCL 2.5 MG/ML IV SOLN
INTRAVENOUS | Status: DC | PRN
  Administered 2023-10-24: 10 mL via INTRA_ARTERIAL

## 2023-10-24 MED ORDER — SODIUM CHLORIDE 0.9% FLUSH: 3.0000 mL | INTRAVENOUS | Status: DC | PRN

## 2023-10-24 MED ORDER — FENTANYL CITRATE (PF) 100 MCG/2ML IJ SOLN
INTRAMUSCULAR | Status: DC | PRN
  Administered 2023-10-24 (×2): 25 ug via INTRAVENOUS

## 2023-10-24 SURGICAL SUPPLY — 15 items
BALLOON EMERGE MR PUSH 1.5X12 (BALLOONS) IMPLANT
BALLOON SAPPHIRE 2.5X15 (BALLOONS) IMPLANT
BALLOON SAPPHIRE NC24 2.75X8 (BALLOONS) IMPLANT
BALLOON TAKERU 1.5X12 (BALLOONS) IMPLANT
CATH LAUNCHER 6FR EBU3.5 (CATHETERS) IMPLANT
DEVICE RAD COMP TR BAND LRG (VASCULAR PRODUCTS) IMPLANT
ELECT DEFIB PAD ADLT CADENCE (PAD) IMPLANT
GLIDESHEATH SLEND SS 6F .021 (SHEATH) IMPLANT
GUIDEWIRE INQWIRE 1.5J.035X260 (WIRE) IMPLANT
KIT ENCORE 26 ADVANTAGE (KITS) IMPLANT
PACK CARDIAC CATHETERIZATION (CUSTOM PROCEDURE TRAY) ×2 IMPLANT
SET ATX-X65L (MISCELLANEOUS) IMPLANT
SHEATH PROBE COVER 6X72 (BAG) IMPLANT
STENT SYNERGY XD 2.50X12 (Permanent Stent) IMPLANT
WIRE RUNTHROUGH IZANAI 014 180 (WIRE) IMPLANT

## 2023-10-24 NOTE — Telephone Encounter (Signed)
 Patient Product/process development scientist completed.    The patient is insured through [facility] MEDICAID.     Ran test claim for Brilinta 90 mg and the current 30 day co-pay is $0.00.   This test claim was processed through [facility]- copay amounts may vary at other pharmacies due to pharmacy/plan contracts, or as the patient moves through the different stages of their insurance plan.     Morgan Arab, CPHT Pharmacy Technician III Certified Patient Advocate [facility] Patient Advocate Team Direct Number: [phone]  Fax: [phone]

## 2023-10-24 NOTE — Progress Notes (Signed)
 Progress Note  Patient Name: Taylor [name] Date of Encounter: 10/24/2023  Primary Cardiologist: [name], MD   Subjective   Patient seen and examined at her bedside.  Plans for PCI today  Inpatient Medications    Scheduled Meds:  aspirin  EC  81 mg Oral Daily   atorvastatin   80 mg Oral Daily   azithromycin   500 mg Oral Daily   diazepam   5 mg Oral BID   diphenhydrAMINE   25 mg Intravenous Once   ferrous gluconate   324 mg Oral Daily   FLUoxetine   10 mg Oral Daily   furosemide  40 mg Intravenous BID   insulin  aspart  0-20 Units Subcutaneous TID WC   insulin  aspart  0-5 Units Subcutaneous QHS   insulin  aspart  8 Units Subcutaneous TID WC   insulin  glargine-yfgn  25 Units Subcutaneous BID   levETIRAcetam   1,000 mg Oral Daily   magnesium  oxide  400 mg Oral Daily   methocarbamol   500 mg Oral BID   methylPREDNISolone  sodium succinate  125 mg Intravenous Once   metoprolol  succinate  50 mg Oral Daily   pantoprazole   40 mg Oral Daily   pregabalin   150 mg Oral TID   risperiDONE   0.5 mg Oral QHS   sodium chloride  flush  3 mL Intravenous Q12H   ticagrelor  180 mg Oral BID   traMADol   50 mg Oral QID   traZODone   200 mg Oral QHS   Continuous Infusions:  sodium chloride  50 mL/hr at 10/24/23 0905   heparin  1,200 Units/hr (10/24/23 0900)   PRN Meds: acetaminophen , albuterol , HYDROcodone -acetaminophen , morphine  injection, nitroGLYCERIN , ondansetron  (ZOFRAN ) IV, sodium chloride  flush, tiZANidine    Vital Signs    Vitals:   10/24/23 0003 10/24/23 0517 10/24/23 0525 10/24/23 0852  BP: 119/63  137/74 124/77  Pulse: 84  81 77  Resp: 20  15 17   Temp: 97.8 F (36.6 C)  (!) 97.4 F (36.3 C) 97.6 F (36.4 C)  TempSrc: Oral  Oral Oral  SpO2: 96%  100% 97%  Weight:  92.3 kg    Height:        Intake/Output Summary (Last 24 hours) at 10/24/2023 0908 Last data filed at 10/24/2023 0536 Gross per 24 hour  Intake 1157.98 ml  Output 300 ml  Net 857.98 ml   Filed Weights    10/22/23 2300 10/23/23 0615 10/24/23 0517  Weight: (P) 92.2 kg 92.2 kg 92.3 kg    Telemetry    Sinus rhythm- Personally Reviewed  ECG     - Personally Reviewed  Physical Exam    General: Comfortable Head: Atraumatic, normal size  Eyes: PEERLA, EOMI  Neck: Supple, normal JVD Cardiac: Normal S1, S2; RRR; no murmurs, rubs, or gallops Lungs: Clear to auscultation bilaterally Abd: Soft, nontender, no hepatomegaly  Ext: warm, no edema Musculoskeletal: No deformities, BUE and BLE strength normal and equal Skin: Warm and dry, no rashes   Neuro: Alert and oriented to person, place, time, and situation, CNII-XII grossly intact, no focal deficits  Psych: Normal mood and affect   Labs    Chemistry Recent Labs  Lab 10/17/23 1625 10/18/23 0250 10/22/23 0218 10/23/23 1055 10/24/23 0248  NA 133*   < > 138 136 136  K 5.5*   < > 3.6 4.2 4.1  CL 93*   < > 97* 93* 95*  CO2 26   < > 29 33* 29  GLUCOSE 422*   < > 241* 287* 213*  BUN 23*   < >  30* 22* 26*  CREATININE 1.23*   < > 0.98 0.81 1.00  CALCIUM  9.6   < > 9.1 9.0 9.6  PROT 7.1  --   --   --   --   ALBUMIN 3.6  --   --   --   --   AST 36  --   --   --   --   ALT 26  --   --   --   --   ALKPHOS 76  --   --   --   --   BILITOT 0.6  --   --   --   --   GFRNONAA 56*   < > >60 >60 >60  ANIONGAP 14   < > 12 10 12    < > = values in this interval not displayed.     Hematology Recent Labs  Lab 10/22/23 0218 10/23/23 0228 10/24/23 0248  WBC 13.2* 13.0* 14.3*  RBC 4.39 4.66 4.51  HGB 8.9* 9.4* 9.5*  HCT 30.8* 32.6* 31.3*  MCV 70.2* 70.0* 69.4*  MCH 20.3* 20.2* 21.1*  MCHC 28.9* 28.8* 30.4  RDW 18.6* 18.6* 19.2*  PLT 224 247 222    Cardiac EnzymesNo results for input(s): "TROPONINI" in the last 168 hours. No results for input(s): "TROPIPOC" in the last 168 hours.   BNP Recent Labs  Lab 10/17/23 1625  BNP 353.2*     DDimer No results for input(s): "DDIMER" in the last 168 hours.   Radiology    No results  found.   Cardiac Studies   Echocardiogram, coronary CTA, left heart catheterization  Patient Profile     43 y.o. female female with severe multivessel coronary artery disease, ischemic cardiomyopathy EF 35 to 40%, peripheral artery disease, hyperlipidemia, diabetes, history of CVA  Assessment & Plan    Severe multivessel coronary artery disease Ischemic cardiomyopathy EF 35 to 40% Hyperlipidemia Diabetes mellitus History of CVA Peripheral artery disease status post left femoral-popliteal artery bypass in 2023  Recent heart catheterization showed severe multivessel disease she was seen by cardiothoracic surgery who deemed the patient to pull surgical candidate.  I am not surprised by this given her other comorbidities.  Further discussions on treatment plans - she will undergo high risk PCI today. I have updated the patient family about the updated plan for PCI.   Informed Consent   Shared Decision Making/Informed Consent The risks [stroke (1 in 1000), death (1 in 1000), kidney failure [usually temporary] (1 in 500), bleeding (1 in 200), allergic reaction [possibly serious] (1 in 200)], benefits (diagnostic support and management of coronary artery disease) and alternatives of a cardiac catheterization were discussed in detail with Taylor [name] and she is willing to proceed.      In the meantime we will continue the patient on a heparin  drip, aspirin  81 mg daily. She has been loaded with Brillinta.   Heart failure with reduced ejection fraction-recent echo showed EF of 35 to 40% which is a drop from previous which was 45 to 50%.  ACE inhibitor and Aldactone held earlier this admission due to kidney failure.  Kidney function has resolved, blood blood pressure is marginal so holding off on adding ARB/ARNI and Aldactone for now.  Her ACE inhibitor's have been held over 36 hours so we will go ahead and just move forward with Entresto , will start this post cath today  Hyperlipidemia - continue  with current statin medication.  LDL at target.  Peripheral artery disease-continue current  medication regimen.  History of CVA continue current medication regimen.  Pneumonia per hospitalist team. Diabetes per hospitalist team.     For questions or updates, please contact [facility] Please consult [email or web address] for contact info under Cardiology/STEMI.      Signed, Cleo Santucci, DO  10/24/2023, 9:08 AM

## 2023-10-24 NOTE — Progress Notes (Addendum)
 [facility] PROGRESS NOTE    Progress Note  TANGELIA Taylor [name]  WUJ:[id number] DOB: 09-02-1980 DOA: 10/17/2023 PCP: Theoplis Fix, MD   Brief Narrative:   Taylor [name] is an 43 y.o. female past medical history of diabetes mellitus type 2, essential hypertension, history of CVA with short-term memory problems, history of seizure and strokes comes in for [facility] for intermittent chest pain that started the day prior to admission.  Assessment/Plan:   Acute respiratory failure probably with hypoxia 2/2 Lobar Pneumonia: CTA done at [facility] for elevated D-dimer showed left lower lobe and right lung base infiltrate with a elevated white blood cell count admission Continue current antibiotic regimen complete 7-day course of antibiotics.  Unstable angina: Left heart cath on 10/20/2023: her coronary showed diffuse plaque throughout LAD circumflex and RCA. Cardiothoracic surgery relates she is a poor candidate for surgical intervention. Cardiology discussed with internationalist for PCI today on 5.13.2025. Continue aspirin , Brilliant, heparin , statins and beta-blockers. Further management per cardiology and atypical chest pain. Continue medical management.  Acute kidney injury: Hold Farxiga , ACE inhibitor and NSAIDs, she was just given contrast. With a baseline creatinine of 0.8. Creatinine has returned to baseline with conservative management.  Chronic systolic heart failure: Echo in April 2025 showed an EF of 45%. Farxiga  and ACE inhibitor were held due to acute kidney injury. Continue metoprolol .  Type II diabetes mellitus (HCC): Last A1c of 7.8.  She is not off steroids to help with the management of her diabetes. Continue long-acting insulin  per sliding scale. Blood glucose still running high increase long-acting insulin  continue sliding scale CBGs, AC and at bedtime.  Chronic back pain: Continue current home regimen.  Microcytic anemia: Patient is on Brilliant,  hemoglobin has remained relatively stable at 8.9, MCV is low likely iron  deficient. Ferritin of 25, iron  of 24 TIBC of 427 saturation of 6%, started on oral iron .  Depressive disorder: Continue Prozac  and trazodone .  PAD (peripheral artery disease): Continue aspirin , Plavix  statins and beta-blockers.  Essential hypertension: ACE inhibitor were held due to borderline blood pressure. Blood pressure is stable.    At high risk for falls: Will need PT  Seizures (HCC) Continue aspiration precaution and Keppra .   DVT prophylaxis: IV heparin  Family Communication:none Status is: Observation The patient will require care spanning > 2 midnights and should be moved to inpatient because: Acute respiratory failure with hypoxia possibly due to acute community-acquired pneumonia    Code Status:     Code Status Orders  (From admission, onward)           Start     Ordered   10/17/23 1600  Full code  Continuous       Question:  By:  Answer:  Consent: discussion documented in EHR   10/17/23 1602           Code Status History     Date Active Date Inactive Code Status Order ID Comments User Context   02/04/2022 1429 02/09/2022 1653 Full Code [id number]  Ron Cobbs, PA-C Inpatient   02/01/2022 0920 02/01/2022 1836 Full Code [id number]  Margherita Shell, MD Inpatient   04/01/2020 1830 04/02/2020 0117 Full Code [id number]  Young Hensen, MD Inpatient   10/22/2019 1324 10/22/2019 2114 Full Code [id number]  Margherita Shell, MD Inpatient   06/23/2019 1852 07/05/2019 2138 Full Code [id number]  Gattis Kass, MD Inpatient   02/20/2019 1842 02/21/2019 2014 Full Code [id number]  Luellen Sages, MD Inpatient  Advance Directive Documentation    Flowsheet Row Most Recent Value  Type of Advance Directive Healthcare Power of Attorney  Pre-existing out of facility DNR order (yellow form or pink MOST form) --  "MOST" Form in Place? --         IV Access:   Peripheral  IV   Procedures and diagnostic studies:   No results found.    Medical Consultants:   None.   Subjective:    Taylor [name] back pain is controlled today.  Objective:    Vitals:   10/23/23 1642 10/24/23 0003 10/24/23 0517 10/24/23 0525  BP: 106/71 119/63  137/74  Pulse:  84  81  Resp: 12 20  15   Temp: 97.6 F (36.4 C) 97.8 F (36.6 C)  (!) 97.4 F (36.3 C)  TempSrc: Oral Oral  Oral  SpO2: 97% 96%  100%  Weight:   92.3 kg   Height:       SpO2: 100 % O2 Flow Rate (L/min): 2 L/min   Intake/Output Summary (Last 24 hours) at 10/24/2023 0833 Last data filed at 10/24/2023 0536 Gross per 24 hour  Intake 1157.98 ml  Output 300 ml  Net 857.98 ml   Filed Weights   10/22/23 2300 10/23/23 0615 10/24/23 0517  Weight: (P) 92.2 kg 92.2 kg 92.3 kg    Exam: General exam: In no acute distress. Respiratory system: Good air movement and clear to auscultation. Cardiovascular system: S1 & S2 heard, RRR. No JVD. Gastrointestinal system: Abdomen is nondistended, soft and nontender.  Extremities: No pedal edema. Skin: No rashes, lesions or ulcers Psychiatry: Judgement and insight appear normal. Mood & affect appropriate.  Data Reviewed:    Labs: Basic Metabolic Panel: Recent Labs  Lab 10/20/23 2325 10/21/23 1010 10/22/23 0218 10/23/23 1055 10/24/23 0248  NA 133* 136 138 136 136  K 4.8 3.8 3.6 4.2 4.1  CL 96* 98 97* 93* 95*  CO2 27 27 29  33* 29  GLUCOSE 519* 285* 241* 287* 213*  BUN 30* 27* 30* 22* 26*  CREATININE 1.28* 0.91 0.98 0.81 1.00  CALCIUM  9.3 9.1 9.1 9.0 9.6   GFR Estimated Creatinine Clearance: 80.6 mL/min (by C-G formula based on SCr of 1 mg/dL). Liver Function Tests: Recent Labs  Lab 10/17/23 1625  AST 36  ALT 26  ALKPHOS 76  BILITOT 0.6  PROT 7.1  ALBUMIN 3.6   No results for input(s): "LIPASE", "AMYLASE" in the last 168 hours. No results for input(s): "AMMONIA" in the last 168 hours. Coagulation profile No results for input(s): "INR",  "PROTIME" in the last 168 hours. COVID-19 Labs  No results for input(s): "DDIMER", "FERRITIN", "LDH", "CRP" in the last 72 hours.   Lab Results  Component Value Date   SARSCOV2NAA NEGATIVE 03/31/2020   SARSCOV2NAA NEGATIVE 10/18/2019   SARSCOV2NAA Not Detected 05/14/2019   SARSCOV2NAA NEGATIVE 03/21/2019    CBC: Recent Labs  Lab 10/20/23 0309 10/21/23 1010 10/22/23 0218 10/23/23 0228 10/24/23 0248  WBC 18.4* 16.5* 13.2* 13.0* 14.3*  HGB 9.1* 8.9* 8.9* 9.4* 9.5*  HCT 31.6* 30.8* 30.8* 32.6* 31.3*  MCV 69.0* 69.2* 70.2* 70.0* 69.4*  PLT 252 212 224 247 222   Cardiac Enzymes: No results for input(s): "CKTOTAL", "CKMB", "CKMBINDEX", "TROPONINI" in the last 168 hours. BNP (last 3 results) No results for input(s): "PROBNP" in the last 8760 hours. CBG: Recent Labs  Lab 10/23/23 1112 10/23/23 1639 10/23/23 2011 10/23/23 2102 10/24/23 0619  GLUCAP 263* 166* 75 131* 172*   D-Dimer: No  results for input(s): "DDIMER" in the last 72 hours. Hgb A1c: No results for input(s): "HGBA1C" in the last 72 hours.  Lipid Profile: No results for input(s): "CHOL", "HDL", "LDLCALC", "TRIG", "CHOLHDL", "LDLDIRECT" in the last 72 hours.  Thyroid function studies: No results for input(s): "TSH", "T4TOTAL", "T3FREE", "THYROIDAB" in the last 72 hours.  Invalid input(s): "FREET3"  Anemia work up: No results for input(s): "VITAMINB12", "FOLATE", "FERRITIN", "TIBC", "IRON ", "RETICCTPCT" in the last 72 hours.  Sepsis Labs: Recent Labs  Lab 10/21/23 1010 10/22/23 0218 10/23/23 0228 10/24/23 0248  WBC 16.5* 13.2* 13.0* 14.3*   Microbiology Recent Results (from the past 240 hours)  Respiratory (~20 pathogens) panel by PCR     Status: None   Collection Time: 10/17/23  7:55 PM   Specimen: Nasopharyngeal Swab; Respiratory  Result Value Ref Range Status   Adenovirus NOT DETECTED NOT DETECTED Final   Coronavirus 229E NOT DETECTED NOT DETECTED Final    Comment: (NOTE) The Coronavirus  on the Respiratory Panel, DOES NOT test for the novel  Coronavirus (2019 nCoV)    Coronavirus HKU1 NOT DETECTED NOT DETECTED Final   Coronavirus NL63 NOT DETECTED NOT DETECTED Final   Coronavirus OC43 NOT DETECTED NOT DETECTED Final   Metapneumovirus NOT DETECTED NOT DETECTED Final   Rhinovirus / Enterovirus NOT DETECTED NOT DETECTED Final   Influenza A NOT DETECTED NOT DETECTED Final   Influenza B NOT DETECTED NOT DETECTED Final   Parainfluenza Virus 1 NOT DETECTED NOT DETECTED Final   Parainfluenza Virus 2 NOT DETECTED NOT DETECTED Final   Parainfluenza Virus 3 NOT DETECTED NOT DETECTED Final   Parainfluenza Virus 4 NOT DETECTED NOT DETECTED Final   Respiratory Syncytial Virus NOT DETECTED NOT DETECTED Final   Bordetella pertussis NOT DETECTED NOT DETECTED Final   Bordetella Parapertussis NOT DETECTED NOT DETECTED Final   Chlamydophila pneumoniae NOT DETECTED NOT DETECTED Final   Mycoplasma pneumoniae NOT DETECTED NOT DETECTED Final    Comment: Performed at [facility], [address], [place], [place] [address]  MRSA Next Gen by PCR, Nasal     Status: None   Collection Time: 10/17/23  7:55 PM   Specimen: Nasal Mucosa; Nasal Swab  Result Value Ref Range Status   MRSA by PCR Next Gen NOT DETECTED NOT DETECTED Final    Comment: (NOTE) The GeneXpert MRSA Assay (FDA approved for NASAL specimens only), is one component of a comprehensive MRSA colonization surveillance program. It is not intended to diagnose MRSA infection nor to guide or monitor treatment for MRSA infections. Test performance is not FDA approved in patients less than 67 years old. Performed at [facility], [address] [place], [place] [address]      Medications:    aspirin  EC  81 mg Oral Daily   atorvastatin   80 mg Oral Daily   azithromycin   500 mg Oral Daily   diazepam   5 mg Oral BID   ferrous gluconate   324 mg Oral Daily   FLUoxetine   10 mg Oral Daily   furosemide  40 mg Intravenous BID    insulin  aspart  0-20 Units Subcutaneous TID WC   insulin  aspart  0-5 Units Subcutaneous QHS   insulin  aspart  8 Units Subcutaneous TID WC   insulin  glargine-yfgn  25 Units Subcutaneous BID   levETIRAcetam   1,000 mg Oral Daily   magnesium  oxide  400 mg Oral Daily   methocarbamol   500 mg Oral BID   metoprolol  succinate  50 mg Oral  Daily   pantoprazole   40 mg Oral Daily   pregabalin   150 mg Oral TID   risperiDONE   0.5 mg Oral QHS   sodium chloride  flush  3 mL Intravenous Q12H   ticagrelor  180 mg Oral BID   traMADol   50 mg Oral QID   traZODone   200 mg Oral QHS   Continuous Infusions:  sodium chloride      heparin  1,100 Units/hr (10/24/23 0000)      LOS: 7 days   Macdonald Savoy  [facility]  10/24/2023, 8:33 AM

## 2023-10-24 NOTE — Discharge Instructions (Signed)

## 2023-10-24 NOTE — Plan of Care (Signed)
   Problem: Activity: Goal: Risk for activity intolerance will decrease Outcome: Progressing   Problem: Nutrition: Goal: Adequate nutrition will be maintained Outcome: Progressing   Problem: Coping: Goal: Level of anxiety will decrease Outcome: Progressing   Problem: Elimination: Goal: Will not experience complications related to bowel motility Outcome: Progressing Goal: Will not experience complications related to urinary retention Outcome: Progressing

## 2023-10-24 NOTE — Progress Notes (Signed)
   Heart Failure Stewardship Pharmacist Progress Note   PCP: Theoplis Fix, MD PCP-Cardiologist: [name], MD    HPI:  43 yo F with PMH of CHF, PAD, partial left foot amputation, HTN, recurrent CVAs with memory loss, T2DM, previous smoker, seizure disorder, and anemia.  Transferred from [facility] on 5/6 with complaints of chest pain, shortness of breath, and LE edema. Also with PNA on CTA findings. CXR with component of CHF. proBNP 336. Coronary CT on 5/8 showed diffuse severe CAD, CAC 99th percentile. Underwent LHC on 5/9 and confirmed multivessel disease, LVEDP 38. CT surgery was consulted and no plans for CABG. ECHO 5/9 showed LVEF 35-40% (was 45-50% in 09/2023), RWMA, G2DD, RV normal. Scheduled for high risk PCI on 5/13.   Met with patient and her family at bedside. States that she has no shortness of breath but finds herself needing to take deep breaths occasionally. She says this is her baseline. No LE edema on exam. Nervous about PCI later today. Reviewed changes to her GDMT and overall goals of therapy. She struggles with short term memory loss so having the written instructions and meds from [facility] discharge pharmacy will be helpful. Prefers to have [facility] continue to supply medications in the future.   Current HF Medications: Diuretic: furosemide 40 mg IV BID Beta Blocker: metoprolol  XL 50 mg daily ACE/ARB/ARNI: Entresto 24/26 mg BID  Prior to admission HF Medications: ACE/ARB/ARNI: lisinopril  40 mg daily MRA: spironolactone 12.5 mg daily SGLT2i: Farxiga  10 mg daily  Pertinent Lab Values: Serum creatinine 1.00, BUN 26, Potassium 4.1, Sodium 136, proBNP 336, BNP 353.2, A1c 8   Vital Signs: Weight: 203 lbs (admission weight: 212 lbs) Blood pressure: 120/70s  Heart rate: 70-80s  I/O: net +1.2L yesterday; net +3.4L since admission  Medication Assistance / Insurance Benefits Check: Does the patient have prescription insurance?  Yes Type of insurance plan: [place]  Medicaid  Outpatient Pharmacy:  Prior to admission outpatient pharmacy: [facility] Is the patient willing to use [facility] TOC pharmacy at discharge? Yes Is the patient willing to transition their outpatient pharmacy to utilize a [facility] outpatient pharmacy?   No    Assessment: 1. Acute on chronic systolic CHF (LVEF 35-40%), due to ICM. NYHA class II symptoms. - On furosemide 40 mg IV BID. May be close to transitioning to PO. Strict I/Os and daily weights. Keep K>4 and Mg>2. - Continue metoprolol  XL 50 mg daily - Agree with starting Entresto 24/26 mg BID today - Consider MRA and SGLT2i prior to discharge   Plan: 1) Medication changes recommended at this time: - Agree with changes - May be close to transitioning to PO lasix  2) Patient assistance: - None pending, has [place]  3)  Education  - Patient has been educated on current HF medications and potential additions to HF medication regimen - Patient verbalizes understanding that over the next few months, these medication doses may change and more medications may be added to optimize HF regimen - Patient has been educated on basic disease state pathophysiology and goals of therapy   Jerilyn Monte, PharmD, BCPS Heart Failure Stewardship Pharmacist Phone [phone]

## 2023-10-24 NOTE — H&P (View-Only) (Signed)
 Progress Note  Patient Name: SURAYA RAJAGOPAL Date of Encounter: 10/24/2023  Primary Cardiologist: [name], MD   Subjective   Patient seen and examined at her bedside.  Plans for PCI today  Inpatient Medications    Scheduled Meds:  aspirin  EC  81 mg Oral Daily   atorvastatin   80 mg Oral Daily   azithromycin   500 mg Oral Daily   diazepam   5 mg Oral BID   diphenhydrAMINE   25 mg Intravenous Once   ferrous gluconate   324 mg Oral Daily   FLUoxetine   10 mg Oral Daily   furosemide  40 mg Intravenous BID   insulin  aspart  0-20 Units Subcutaneous TID WC   insulin  aspart  0-5 Units Subcutaneous QHS   insulin  aspart  8 Units Subcutaneous TID WC   insulin  glargine-yfgn  25 Units Subcutaneous BID   levETIRAcetam   1,000 mg Oral Daily   magnesium  oxide  400 mg Oral Daily   methocarbamol   500 mg Oral BID   methylPREDNISolone  sodium succinate  125 mg Intravenous Once   metoprolol  succinate  50 mg Oral Daily   pantoprazole   40 mg Oral Daily   pregabalin   150 mg Oral TID   risperiDONE   0.5 mg Oral QHS   sodium chloride  flush  3 mL Intravenous Q12H   ticagrelor  180 mg Oral BID   traMADol   50 mg Oral QID   traZODone   200 mg Oral QHS   Continuous Infusions:  sodium chloride  50 mL/hr at 10/24/23 0905   heparin  1,200 Units/hr (10/24/23 0900)   PRN Meds: acetaminophen , albuterol , HYDROcodone -acetaminophen , morphine  injection, nitroGLYCERIN , ondansetron  (ZOFRAN ) IV, sodium chloride  flush, tiZANidine    Vital Signs    Vitals:   10/24/23 0003 10/24/23 0517 10/24/23 0525 10/24/23 0852  BP: 119/63  137/74 124/77  Pulse: 84  81 77  Resp: 20  15 17   Temp: 97.8 F (36.6 C)  (!) 97.4 F (36.3 C) 97.6 F (36.4 C)  TempSrc: Oral  Oral Oral  SpO2: 96%  100% 97%  Weight:  92.3 kg    Height:        Intake/Output Summary (Last 24 hours) at 10/24/2023 0908 Last data filed at 10/24/2023 0536 Gross per 24 hour  Intake 1157.98 ml  Output 300 ml  Net 857.98 ml   Filed Weights    10/22/23 2300 10/23/23 0615 10/24/23 0517  Weight: (P) 92.2 kg 92.2 kg 92.3 kg    Telemetry    Sinus rhythm- Personally Reviewed  ECG     - Personally Reviewed  Physical Exam    General: Comfortable Head: Atraumatic, normal size  Eyes: PEERLA, EOMI  Neck: Supple, normal JVD Cardiac: Normal S1, S2; RRR; no murmurs, rubs, or gallops Lungs: Clear to auscultation bilaterally Abd: Soft, nontender, no hepatomegaly  Ext: warm, no edema Musculoskeletal: No deformities, BUE and BLE strength normal and equal Skin: Warm and dry, no rashes   Neuro: Alert and oriented to person, place, time, and situation, CNII-XII grossly intact, no focal deficits  Psych: Normal mood and affect   Labs    Chemistry Recent Labs  Lab 10/17/23 1625 10/18/23 0250 10/22/23 0218 10/23/23 1055 10/24/23 0248  NA 133*   < > 138 136 136  K 5.5*   < > 3.6 4.2 4.1  CL 93*   < > 97* 93* 95*  CO2 26   < > 29 33* 29  GLUCOSE 422*   < > 241* 287* 213*  BUN 23*   < >  30* 22* 26*  CREATININE 1.23*   < > 0.98 0.81 1.00  CALCIUM  9.6   < > 9.1 9.0 9.6  PROT 7.1  --   --   --   --   ALBUMIN 3.6  --   --   --   --   AST 36  --   --   --   --   ALT 26  --   --   --   --   ALKPHOS 76  --   --   --   --   BILITOT 0.6  --   --   --   --   GFRNONAA 56*   < > >60 >60 >60  ANIONGAP 14   < > 12 10 12    < > = values in this interval not displayed.     Hematology Recent Labs  Lab 10/22/23 0218 10/23/23 0228 10/24/23 0248  WBC 13.2* 13.0* 14.3*  RBC 4.39 4.66 4.51  HGB 8.9* 9.4* 9.5*  HCT 30.8* 32.6* 31.3*  MCV 70.2* 70.0* 69.4*  MCH 20.3* 20.2* 21.1*  MCHC 28.9* 28.8* 30.4  RDW 18.6* 18.6* 19.2*  PLT 224 247 222    Cardiac EnzymesNo results for input(s): "TROPONINI" in the last 168 hours. No results for input(s): "TROPIPOC" in the last 168 hours.   BNP Recent Labs  Lab 10/17/23 1625  BNP 353.2*     DDimer No results for input(s): "DDIMER" in the last 168 hours.   Radiology    No results  found.   Cardiac Studies   Echocardiogram, coronary CTA, left heart catheterization  Patient Profile     43 y.o. female female with severe multivessel coronary artery disease, ischemic cardiomyopathy EF 35 to 40%, peripheral artery disease, hyperlipidemia, diabetes, history of CVA  Assessment & Plan    Severe multivessel coronary artery disease Ischemic cardiomyopathy EF 35 to 40% Hyperlipidemia Diabetes mellitus History of CVA Peripheral artery disease status post left femoral-popliteal artery bypass in 2023  Recent heart catheterization showed severe multivessel disease she was seen by cardiothoracic surgery who deemed the patient to pull surgical candidate.  I am not surprised by this given her other comorbidities.  Further discussions on treatment plans - she will undergo high risk PCI today. I have updated the patient family about the updated plan for PCI.   Informed Consent   Shared Decision Making/Informed Consent The risks [stroke (1 in 1000), death (1 in 1000), kidney failure [usually temporary] (1 in 500), bleeding (1 in 200), allergic reaction [possibly serious] (1 in 200)], benefits (diagnostic support and management of coronary artery disease) and alternatives of a cardiac catheterization were discussed in detail with [name] and she is willing to proceed.      In the meantime we will continue the patient on a heparin  drip, aspirin  81 mg daily. She has been loaded with Brillinta.   Heart failure with reduced ejection fraction-recent echo showed EF of 35 to 40% which is a drop from previous which was 45 to 50%.  ACE inhibitor and Aldactone held earlier this admission due to kidney failure.  Kidney function has resolved, blood blood pressure is marginal so holding off on adding ARB/ARNI and Aldactone for now.  Her ACE inhibitor's have been held over 36 hours so we will go ahead and just move forward with Entresto , will start this post cath today  Hyperlipidemia - continue  with current statin medication.  LDL at target.  Peripheral artery disease-continue current  medication regimen.  History of CVA continue current medication regimen.  Pneumonia per hospitalist team. Diabetes per hospitalist team.     For questions or updates, please contact [facility] Please consult [email or web address] for contact info under Cardiology/STEMI.      Signed, Cleo Santucci, DO  10/24/2023, 9:08 AM

## 2023-10-24 NOTE — Progress Notes (Signed)
 PHARMACY - ANTICOAGULATION CONSULT NOTE  Pharmacy Consult for heparin  Indication: chest pain/ACS  Allergies  Allergen Reactions   Amitriptyline Other (See Comments)    Unresponsive    Contrast Media [Iodinated Contrast Media] Other (See Comments)    2013-had CVA, went into AFR-combination ABT, contrast, low BP   Morphine  And Codeine Other (See Comments)    Chest pain   Promethazine  Swelling    Lips swell   Compazine Other (See Comments)    -Alert but unable to move   Prochlorperazine Maleate Other (See Comments)    -Alert, but unable to move   Adhesive [Tape] Rash    Long term exposure causes skin to tear    Oxycodone  Nausea And Vomiting    Severe n/v -  patient will not take oxycodone  or percocet.   Sumatriptan Rash    Patient Measurements: Height: 5\' 5"  (165.1 cm) Weight: 92.3 kg (203 lb 6.4 oz) IBW/kg (Calculated) : 57 HEPARIN  DW (KG): 78.4  Vital Signs: Temp: 97.4 F (36.3 C) (05/13 0525) Temp Source: Oral (05/13 0525) BP: 137/74 (05/13 0525) Pulse Rate: 81 (05/13 0525)  Labs: Recent Labs    10/22/23 0218 10/23/23 0228 10/23/23 1055 10/24/23 0248  HGB 8.9* 9.4*  --  9.5*  HCT 30.8* 32.6*  --  31.3*  PLT 224 247  --  222  HEPARINUNFRC 0.60 0.36  --  0.27*  CREATININE 0.98  --  0.81 1.00    Estimated Creatinine Clearance: 80.6 mL/min (by C-G formula based on SCr of 1 mg/dL).   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   due to elevated blood sugar 1700-off insulin  6 months   Depression    Diabetes mellitus    off insulin  6 months   Diverticulitis    Dyspnea    when walking   GERD (gastroesophageal reflux disease)    Hypertension    Kidney dialysis status    on dialysis(stroke) off after 4-6 weeks   Pneumonia 2013, 06/2018   Respiratory failure (HCC) 04/2012   with stroke - in hosp [facility]  12 weeks   Seizures (HCC)    during elvated blood sugsr episode 11/13   Sepsis (HCC)    Stroke (HCC)    11/2018, 12/2018 Weak left side, speech- slurred, Short term memeroy, Gait unsteady    Vocal cord dysfunction     Assessment: 43 yo W with multivessel CAD pending CT Surgery evaluation. No anticoagulation prior to admission. Pharmacy consulted for heparin .    Heparin  level came back slightly subtherapeutic this morning at 0.27, on heparin  infusion at 1100 units/hr. Hgb 9.5, plt 222. No s/sx of bleeding or infusion issues.   Goal of Therapy:  Heparin  level 0.3-0.7 units/ml Monitor platelets by anticoagulation protocol: Yes   Plan:  Increase heparin  infusion to 1200 units/hr Daily heparin  level, CBC F/u after cardiac cath  Thank you for allowing pharmacy to participate in this patient's care,  Nieves Bars, PharmD, BCCCP Clinical Pharmacist  Phone: [phone] 10/24/2023 7:37 AM  Please check AMION for all [facility] Pharmacy phone numbers After 10:00 PM, call Main Pharmacy [phone]

## 2023-10-24 NOTE — Interval H&P Note (Signed)
 History and Physical Interval Note:  10/24/2023 4:40 PM  Taylor [name]  has presented today for surgery, with the diagnosis of cad - Unstable Angina.  The various methods of treatment have been discussed with the patient and family. After consideration of risks, benefits and other options for treatment, the patient has consented to  Procedure(s): CORONARY STENT INTERVENTION (N/A)   as a surgical intervention.  The patient's history has been reviewed, patient examined, no change in status, stable for surgery.  I have reviewed the patient's chart and labs.  Questions were answered to the patient's satisfaction.    Cath Lab Visit (complete for each Cath Lab visit)  Clinical Evaluation Leading to the Procedure:   ACS: Yes.    Non-ACS:    Anginal Classification: CCS III  Anti-ischemic medical therapy: Minimal Therapy (1 class of medications)  Non-Invasive Test Results: High-risk stress test findings: cardiac mortality >3%/year - Dx Cath with MV CAD  Prior CABG: No previous CABG - tuurned down for CABG    Randene Bustard

## 2023-10-25 ENCOUNTER — Other Ambulatory Visit (HOSPITAL_COMMUNITY): Payer: Self-pay

## 2023-10-25 ENCOUNTER — Encounter: Payer: Self-pay | Admitting: Cardiology

## 2023-10-25 DIAGNOSIS — D509 Iron deficiency anemia, unspecified: Secondary | ICD-10-CM | POA: Diagnosis not present

## 2023-10-25 DIAGNOSIS — F3170 Bipolar disorder, currently in remission, most recent episode unspecified: Secondary | ICD-10-CM

## 2023-10-25 DIAGNOSIS — I259 Chronic ischemic heart disease, unspecified: Secondary | ICD-10-CM | POA: Diagnosis not present

## 2023-10-25 DIAGNOSIS — G890 Central pain syndrome: Secondary | ICD-10-CM

## 2023-10-25 DIAGNOSIS — Z9181 History of falling: Secondary | ICD-10-CM

## 2023-10-25 DIAGNOSIS — E1100 Type 2 diabetes mellitus with hyperosmolarity without nonketotic hyperglycemic-hyperosmolar coma (NKHHC): Secondary | ICD-10-CM | POA: Diagnosis not present

## 2023-10-25 DIAGNOSIS — R569 Unspecified convulsions: Secondary | ICD-10-CM

## 2023-10-25 DIAGNOSIS — I739 Peripheral vascular disease, unspecified: Secondary | ICD-10-CM

## 2023-10-25 LAB — BASIC METABOLIC PANEL WITH GFR
Anion gap: 13 (ref 5–15)
BUN: 25 mg/dL — ABNORMAL HIGH (ref 6–20)
CO2: 24 mmol/L (ref 22–32)
Calcium: 8.9 mg/dL (ref 8.9–10.3)
Chloride: 92 mmol/L — ABNORMAL LOW (ref 98–111)
Creatinine, Ser: 1.18 mg/dL — ABNORMAL HIGH (ref 0.44–1.00)
GFR, Estimated: 58 mL/min — ABNORMAL LOW (ref >60)
Glucose, Bld: 522 mg/dL (ref 70–99)
Potassium: 4.7 mmol/L (ref 3.5–5.1)
Sodium: 129 mmol/L — ABNORMAL LOW (ref 135–145)

## 2023-10-25 LAB — CBC
HCT: 31.7 % — ABNORMAL LOW (ref 36.0–46.0)
Hemoglobin: 9.1 g/dL — ABNORMAL LOW (ref 12.0–15.0)
MCH: 20.3 pg — ABNORMAL LOW (ref 26.0–34.0)
MCHC: 28.7 g/dL — ABNORMAL LOW (ref 30.0–36.0)
MCV: 70.8 fL — ABNORMAL LOW (ref 80.0–100.0)
Platelets: 257 10*3/uL (ref 150–400)
RBC: 4.48 MIL/uL (ref 3.87–5.11)
RDW: 19.7 % — ABNORMAL HIGH (ref 11.5–15.5)
WBC: 26.4 10*3/uL — ABNORMAL HIGH (ref 4.0–10.5)
nRBC: 0 % (ref 0.0–0.2)

## 2023-10-25 LAB — RENAL FUNCTION PANEL
Albumin: 3.4 g/dL — ABNORMAL LOW (ref 3.5–5.0)
Anion gap: 11 (ref 5–15)
BUN: 21 mg/dL — ABNORMAL HIGH (ref 6–20)
CO2: 25 mmol/L (ref 22–32)
Calcium: 9.2 mg/dL (ref 8.9–10.3)
Chloride: 95 mmol/L — ABNORMAL LOW (ref 98–111)
Creatinine, Ser: 0.87 mg/dL (ref 0.44–1.00)
GFR, Estimated: >60 mL/min (ref >60)
Glucose, Bld: 318 mg/dL — ABNORMAL HIGH (ref 70–99)
Phosphorus: 3.5 mg/dL (ref 2.5–4.6)
Potassium: 3.8 mmol/L (ref 3.5–5.1)
Sodium: 131 mmol/L — ABNORMAL LOW (ref 135–145)

## 2023-10-25 LAB — GLUCOSE, CAPILLARY
Glucose-Capillary: 447 mg/dL — ABNORMAL HIGH (ref 70–99)
Glucose-Capillary: 290 mg/dL — ABNORMAL HIGH (ref 70–99)
Glucose-Capillary: 311 mg/dL — ABNORMAL HIGH (ref 70–99)
Glucose-Capillary: 279 mg/dL — ABNORMAL HIGH (ref 70–99)
Glucose-Capillary: 164 mg/dL — ABNORMAL HIGH (ref 70–99)
Glucose-Capillary: 174 mg/dL — ABNORMAL HIGH (ref 70–99)
Glucose-Capillary: 249 mg/dL — ABNORMAL HIGH (ref 70–99)

## 2023-10-25 LAB — MAGNESIUM: Magnesium: 2.1 mg/dL (ref 1.7–2.4)

## 2023-10-25 MED ORDER — DAPAGLIFLOZIN PROPANEDIOL 10 MG PO TABS
10.0000 mg | ORAL_TABLET | Freq: Every day | ORAL | Status: DC
Start: 2023-10-25 — End: 2023-10-26
  Administered 2023-10-25 – 2023-10-26 (×2): 10 mg via ORAL
  Filled 2023-10-25 (×2): qty 1

## 2023-10-25 MED ORDER — RANOLAZINE ER 500 MG PO TB12
500.0000 mg | ORAL_TABLET | Freq: Two times a day (BID) | ORAL | Status: DC
  Administered 2023-10-25 – 2023-10-26 (×3): 500 mg via ORAL
  Filled 2023-10-25 (×4): qty 1

## 2023-10-25 MED ORDER — INSULIN GLARGINE-YFGN 100 UNIT/ML ~~LOC~~ SOLN
30.0000 [IU] | Freq: Two times a day (BID) | SUBCUTANEOUS | Status: DC
  Administered 2023-10-25 – 2023-10-26 (×2): 30 [IU] via SUBCUTANEOUS
  Filled 2023-10-25 (×3): qty 0.3

## 2023-10-25 MED ORDER — FUROSEMIDE 40 MG PO TABS
40.0000 mg | ORAL_TABLET | Freq: Every day | ORAL | Status: DC
  Administered 2023-10-25 – 2023-10-26 (×2): 40 mg via ORAL
  Filled 2023-10-25 (×2): qty 1

## 2023-10-25 NOTE — Progress Notes (Signed)
 Pt declined ambulation, stating she has had a HA all day, 7/10. Encouraged her to try x3-4 however she continued to decline. She has been moving to the [facility] multiple times. She does endorse chest heaviness off and on, "maybe 4/10"  Discussed with pt and her brother Jerrilyn Moras (by phone) stent, restrictions, importance, of Brilinta, other CAD, importance of diet and exercise, NTG, exercise guidelines, continued smoking cessation (sts she quit 6 months ago), and CRPII. Pt voiced reception and Josh asked pertinent questions. Will refer to [facility] CRPII.   Josh does question her increase in WBC. He also sts there is incorrect information in her cath report (sts he did not have a stent in the past that had ISR) that he would like to be edited.  German Koller BS, ACSM-CEP 10/25/2023 2:07 PM [phone]

## 2023-10-25 NOTE — Progress Notes (Signed)
 Progress Note  Patient Name: Taylor [name] Date of Encounter: 10/25/2023  Primary Cardiologist: [name], MD   Subjective   Patient seen and examined at her bedside.  Status post PCI - DES to the LCX yesterday  Inpatient Medications    Scheduled Meds:  aspirin  EC  81 mg Oral Daily   atorvastatin   80 mg Oral Daily   diazepam   5 mg Oral BID   ferrous gluconate   324 mg Oral Daily   FLUoxetine   10 mg Oral Daily   furosemide  40 mg Intravenous BID   heparin   5,000 Units Subcutaneous Q8H   insulin  aspart  0-20 Units Subcutaneous TID WC   insulin  aspart  0-5 Units Subcutaneous QHS   insulin  aspart  8 Units Subcutaneous TID WC   insulin  glargine-yfgn  25 Units Subcutaneous BID   levETIRAcetam   1,000 mg Oral Daily   magnesium  oxide  400 mg Oral Daily   methocarbamol   500 mg Oral BID   metoprolol  succinate  50 mg Oral Daily   pantoprazole   40 mg Oral Daily   pregabalin   150 mg Oral TID   risperiDONE   0.5 mg Oral QHS   sacubitril-valsartan  1 tablet Oral BID   sodium chloride  flush  3 mL Intravenous Q12H   sodium chloride  flush  3 mL Intravenous Q12H   ticagrelor  90 mg Oral BID   traMADol   50 mg Oral QID   traZODone   200 mg Oral QHS   Continuous Infusions:  sodium chloride      PRN Meds: sodium chloride , acetaminophen , albuterol , HYDROcodone -acetaminophen , morphine  injection, nitroGLYCERIN , ondansetron  (ZOFRAN ) IV, sodium chloride  flush, sodium chloride  flush, tiZANidine    Vital Signs    Vitals:   10/24/23 2100 10/24/23 2244 10/25/23 0321 10/25/23 0736  BP:  90/63 107/73 (!) 102/58  Pulse: 94 81 71 90  Resp: (!) 25 19 18 16   Temp:  98 F (36.7 C) 98.1 F (36.7 C) 97.7 F (36.5 C)  TempSrc:  Oral Oral Oral  SpO2: 94% 97% 96% 95%  Weight:      Height:        Intake/Output Summary (Last 24 hours) at 10/25/2023 0828 Last data filed at 10/25/2023 0736 Gross per 24 hour  Intake 2117.14 ml  Output 1000 ml  Net 1117.14 ml   Filed Weights   10/22/23 2300  10/23/23 0615 10/24/23 0517  Weight: (P) 92.2 kg 92.2 kg 92.3 kg    Telemetry    Sinus rhythm- Personally Reviewed  ECG     - Personally Reviewed  Physical Exam    General: Comfortable Head: Atraumatic, normal size  Eyes: PEERLA, EOMI  Neck: Supple, normal JVD Cardiac: Normal S1, S2; RRR; no murmurs, rubs, or gallops Lungs: Clear to auscultation bilaterally Abd: Soft, nontender, no hepatomegaly  Ext: warm, no edema Musculoskeletal: No deformities, BUE and BLE strength normal and equal Skin: Warm and dry, no rashes   Neuro: Alert and oriented to person, place, time, and situation, CNII-XII grossly intact, no focal deficits  Psych: Normal mood and affect   Labs    Chemistry Recent Labs  Lab 10/23/23 1055 10/24/23 0248 10/24/23 2323  NA 136 136 129*  K 4.2 4.1 4.7  CL 93* 95* 92*  CO2 33* 29 24  GLUCOSE 287* 213* 522*  BUN 22* 26* 25*  CREATININE 0.81 1.00 1.18*  CALCIUM  9.0 9.6 8.9  GFRNONAA >60 >60 58*  ANIONGAP 10 12 13      Hematology Recent Labs  Lab 10/22/23  9147 10/23/23 0228 10/24/23 0248  WBC 13.2* 13.0* 14.3*  RBC 4.39 4.66 4.51  HGB 8.9* 9.4* 9.5*  HCT 30.8* 32.6* 31.3*  MCV 70.2* 70.0* 69.4*  MCH 20.3* 20.2* 21.1*  MCHC 28.9* 28.8* 30.4  RDW 18.6* 18.6* 19.2*  PLT 224 247 222    Cardiac EnzymesNo results for input(s): "TROPONINI" in the last 168 hours. No results for input(s): "TROPIPOC" in the last 168 hours.   BNP No results for input(s): "BNP", "PROBNP" in the last 168 hours.    DDimer No results for input(s): "DDIMER" in the last 168 hours.   Radiology    CARDIAC CATHETERIZATION Result Date: 10/24/2023 Images from the original result were not included.   Dist LM lesion is 50% stenosed.   Prox LAD lesion is 70% stenosed.  1st Diag lesion is 70% stenosed-eccentric calcified.  Mid LAD lesion is 40% stenosed.   Ost Cx to Prox Cx lesion is 70% stenosed -eccentric calcified.   CULPRIT LESION: Mid Cx to Dist Cx lesion is 99% stenosed.    A drug-eluting stent was successfully placed using a STENT SYNERGY XD 2.50X12 -> postdilated in tapered fashion from 2.9-2.6 mm.  TIMI-3 flow maintained.Aaron Aas  Post intervention, there is a 0% residual stenosis.   Lesion #2: Dist Cx lesion is 99% stenosed.   Balloon angioplasty was performed using a BALLOON TAKERU 1.5X12.Post intervention, there is a 50% residual stenosis. Diagnostic: Dominance: Right     Intervention  Successful DES PCI of the most significant lesion-99% mid-distal LCx using Synergy XD 2.5 mm x 12 mm stent deployed in tapered fashion from 2.9-2.6 mm. Balloon angioplasty of 99% lesion distal to stent and small caliber vessel using 1.5 mm 12 mm balloon.  Reducing 99% to 50% stenosis. The decision to treat the most significant culprit lesion as the first PCI option was based on the extent of disease and potential difficulty of treating LAD and LCx with existing RCA disease.  Preferably she will do well with PCI of the 99% stenosis and medical management on the remainder of the vessels. RECOMMENDATIONS Anticipated discharge date to be determined. Plan will be to treat existing disease in the LM-ostial LCx, proximal LAD and diffuse RCA with medical management. If she were to have recurrent anginal symptoms after this PCI would consider debulking PCI of the proximal LAD crossing the diagonal branch but sparing the ostium of the LAD.  This would be step to a PCI if necessary. Step 3 PCI if she continues to have symptoms despite mid LCx and proximal LAD PCI, with then consider distal LM-ostial LCx PCI from LM into LCx with provisional PTCA/PCI of the ostial LAD although this would be preferably last choice to avoid left main PCI. Would recommend treating the RCA medically based on the diffuse nature of the disease as it would require essentially full metal jacket throughout the entire vessel to adequately treat. Recommend uninterrupted dual antiplatelet therapy with Aspirin  81mg  daily and Ticagrelor 90mg  twice  daily for a minimum of 12 months (ACS-Class I recommendation). Would continue long-term Thienopyridine coverage after initial year based on extent of disease.  Can reduce to 60 mg twice daily Brilinta versus converting to clopidogrel  75 mg daily. Randene Bustard, MD, MS Randene Bustard, M.D., M.S. Interventional Cardiologist [facility] Pager # [phone]    Cardiac Studies   Echocardiogram, coronary CTA, left heart catheterization  Patient Profile     43 y.o. female female with severe multivessel coronary artery disease, ischemic cardiomyopathy EF 35 to  40%, peripheral artery disease, hyperlipidemia, diabetes, history of CVA  Assessment & Plan    Severe multivessel coronary artery disease Ischemic cardiomyopathy EF 35 to 40% Hyperlipidemia Diabetes mellitus History of CVA Peripheral artery disease status post left femoral-popliteal artery bypass in 2023  Recent heart catheterization showed severe multivessel disease she was seen by cardiothoracic surgery who deemed the patient to pull surgical candidate- she is status post PCI to the LCX artery, she tells me her chest pain has significantly improved though not completely resolved.  I will add Ranexa 500 mg twice daily.  Heparin  stopped , continue Aspirin  and Brillinta. Continue current dose of statin.  Heart failure with reduced ejection fraction-recent echo showed EF of 35 to 40% which is a drop from previous which was 45 to 50%.  Entresto started yesterday, will plan to add Aldactone once blood pressure   ACE inhibitor and Aldactone held earlier this admission due to kidney failure.  Bumped up a little bit after her procedure.  Will monitor this.  Hyperlipidemia - continue with current statin medication.  LDL at target.  Peripheral artery disease-continue current medication regimen.  History of CVA continue current medication regimen.  Pneumonia per hospitalist team. Diabetes per hospitalist team.     For questions  or updates, please contact [facility] Please consult [email or web address] for contact info under Cardiology/STEMI.      Signed, Darren Nodal, DO  10/25/2023, 8:28 AM

## 2023-10-25 NOTE — Progress Notes (Signed)
 Heart Failure Nurse Navigator Progress Note  PCP: Theoplis Fix, MD PCP-Cardiologist: Hochrein Admission Diagnosis: None Admitted from: [facility]   Presentation:   Taylor [name] presented from [facility] with complaints of chest pain, hyperglycemia with CBG's up to 600, with solumedrol use. CTA showing pneumonia, CXR with components of CHF and severe anxiety. Reports that she has suffered some memory loss due to her previous strokes, she is disabled and her mother and father take care of her and are her legal guardians. BP 129/79, HR 104, BNP 353.2, Left heart cath on 5/9 showed multi vessel CAD. Deemed to be poor candidate by cardiothoracic surgery. She had DES and PCI to mid and distal circumflex on 5/13.   Called and spoke with patients Mother Britani Durben ( legal guardian) about the sign and symptoms of heart failure, daily weights, when to call her doctor or go to the ED. Diet/ fluid restrictions ( reports her daughter likes salt and drinks mainly Diet Dr. Kathlene Paradise) Education on taking all medications as prescribed and attending all medical appointments. Parents will bring their daughter for a HF TOC appointment on 11/03/2023 @ 11:45 am.   ECHO/ LVEF: 35-40%  Clinical Course:  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   due to elevated blood sugar 1700-off insulin  6 months   Depression    Diabetes mellitus    off insulin  6 months   Diverticulitis    Dyspnea    when walking   GERD (gastroesophageal reflux disease)    Hypertension    Kidney dialysis status    on dialysis(stroke) off after 4-6 weeks   Pneumonia 2013, 06/2018   Respiratory failure (HCC) 04/2012   with stroke - in hosp [facility] 12 weeks   Seizures (HCC)    during elvated blood sugsr episode 11/13   Sepsis (HCC)    Stroke (HCC)    11/2018, 12/2018 Weak left side, speech-  slurred, Short term memeroy, Gait unsteady    Vocal cord dysfunction      Social History   Socioeconomic History   Marital status: Single    Spouse name: Not on file   Number of children: 0   Years of education: 14   Highest education level: Not on file  Occupational History   Not on file  Tobacco Use   Smoking status: Former    Current packs/day: 0.00    Average packs/day: 0.6 packs/day for 23.0 years (13.8 ttl pk-yrs)    Types: Cigarettes    Start date: 02/05/1996    Quit date: 02/05/2019    Years since quitting: 4.7    Passive exposure: Never   Smokeless tobacco: Never  Vaping Use   Vaping status: Never Used  Substance and Sexual Activity   Alcohol use: No    Alcohol/week: 0.0 standard drinks of alcohol   Drug use: No   Sexual activity: Not Currently    Birth control/protection: None  Other Topics Concern   Not on file  Social History Narrative   She is single and does not have any children.    She has 2 yrs of college level education.   She has 5-6 caffeine drinks daily.    Left handed      Living with parents.   Social Drivers of Health   Financial Resource Strain: Low Risk  (06/30/2023)  Received from [facility]   Overall Financial Resource Strain (CARDIA)    Difficulty of Paying Living Expenses: Not hard at all  Food Insecurity: No Food Insecurity (06/30/2023)   Received from [facility]   Hunger Vital Sign    Worried About Running Out of Food in the Last Year: Never true    Ran Out of Food in the Last Year: Never true  Transportation Needs: No Transportation Needs (06/30/2023)   Received from [facility] - Transportation    Lack of Transportation (Medical): No    Lack of Transportation (Non-Medical): No  Physical Activity: Insufficiently Active (06/30/2023)   Received [facility]   Exercise Vital Sign    Days of Exercise per Week: 2 days    Minutes of Exercise per Session: 30 min  Stress: No Stress Concern Present  (06/30/2023)   Received from [facility] - Occupational Stress Questionnaire    Feeling of Stress : Not at all  Social Connections: Patient Unable To Answer (10/18/2023)   Social Connection and Isolation Panel [NHANES]    Frequency of Communication with Friends and Family: Patient unable to answer    Frequency of Social Gatherings with Friends and Family: Patient unable to answer    Attends Religious Services: Patient unable to answer    Active Member of Clubs or Organizations: Patient unable to answer    Attends Banker Meetings: Patient unable to answer    Marital Status: Patient unable to answer   Education Assessment and Provision:  Detailed education and instructions provided on heart failure disease management including the following:  Signs and symptoms of Heart Failure When to call the physician Importance of daily weights Low sodium diet Fluid restriction Medication management Anticipated future follow-up appointments  Patient education given on each of the above topics.  Patient acknowledges understanding via teach back method and acceptance of all instructions.  Education Materials:  "Living Better With Heart Failure" Booklet, HF zone tool, & Daily Weight Tracker Tool.  Patient has scale at home: Yes Patient has pill box at home: Yes    High Risk Criteria for Readmission and/or Poor Patient Outcomes: Heart failure hospital admissions (last 6 months): 1  No Show rate: 3 %  Difficult social situation: No, lives with her parents, whom are her legal guardians.  Demonstrates medication adherence: yes Primary Language: English  Literacy level: Reading, writing, ( SHORT TERM MEMORY LOSS)   Barriers of Care:   Diet/ fluid restrictions ( salt and Diet Dr. Kathlene Paradise) Daily weights Short term memory loss from CVA's  Considerations/Referrals:   Referral made to Heart Failure Pharmacist Stewardship: yes Referral made  to Heart Failure CSW/NCM TOC: NA Referral made to Heart & Vascular TOC clinic: Yes, 11/03/2023 @ 11 :45 am.   Items for Follow-up on DC/TOC: Continued HF education Diet/ fluid restrictions / daily weights   Randie Bustle, BSN, RN Heart Failure Teacher, adult education Only

## 2023-10-25 NOTE — Progress Notes (Signed)
   Heart Failure Stewardship Pharmacist Progress Note   PCP: Theoplis Fix, MD PCP-Cardiologist: [name], MD    HPI:  43 yo F with PMH of CHF, PAD, partial left foot amputation, HTN, recurrent CVAs with memory loss, T2DM, previous smoker, seizure disorder, and anemia.  Transferred from [facility] on 5/6 with complaints of chest pain, shortness of breath, and LE edema. Also with PNA on CTA findings. CXR with component of CHF. proBNP 336. Coronary CT on 5/8 showed diffuse severe CAD, CAC 99th percentile. Underwent LHC on 5/9 and confirmed multivessel disease, LVEDP 38. CT surgery was consulted and no plans for CABG. ECHO 5/9 showed LVEF 35-40% (was 45-50% in 09/2023), RWMA, G2DD, RV normal. S/p high risk PCI with DES to LCx on 5/13.   Met with patient today. States that her breathing is improved from yesterday. Her only complaint is that she is in quite a bit of pain. Had Norco this morning and her nurse reports that she doesn't like to take morphine  because of how it makes her feel. No LE edema on exam.   Current HF Medications: Diuretic: furosemide 40 mg PO daily Beta Blocker: metoprolol  XL 50 mg daily ACE/ARB/ARNI: Entresto 24/26 mg BID  Prior to admission HF Medications: ACE/ARB/ARNI: lisinopril  40 mg daily MRA: spironolactone 12.5 mg daily SGLT2i: Farxiga  10 mg daily  Pertinent Lab Values: Serum creatinine 1.18, BUN 25, Potassium 4.7, Sodium 129, proBNP 336, BNP 353.2, A1c 8   Vital Signs: Weight: 202 lbs (admission weight: 212 lbs) Blood pressure: 100/60s  Heart rate: 70-90s  I/O: net +1L yesterday; net +4.4L since admission  Medication Assistance / Insurance Benefits Check: Does the patient have prescription insurance?  Yes Type of insurance plan: [place] Medicaid  Outpatient Pharmacy:  Prior to admission outpatient pharmacy: [facility] Is the patient willing to use [facility] TOC pharmacy at discharge? Yes Is the patient willing to transition their outpatient pharmacy to  utilize a [facility] outpatient pharmacy?   No    Assessment: 1. Acute on chronic systolic CHF (LVEF 35-40%), due to ICM. NYHA class II symptoms. - Agree with transitioning to furosemide 40 mg PO daily. Strict I/Os and daily weights. Keep K>4 and Mg>2. - Continue metoprolol  XL 50 mg daily - Continue Entresto 24/26 mg BID today - Consider MRA and SGLT2i prior to discharge   Plan: 1) Medication changes recommended at this time: - Agree with changes - Start spironolactone vs Farxiga  tomorrow pending BP and K - Messaged [facility] about pain regimen  2) Patient assistance: - None pending, has [place] Medicaid  3)  Education  - Patient has been educated on current HF medications and potential additions to HF medication regimen - Patient verbalizes understanding that over the next few months, these medication doses may change and more medications may be added to optimize HF regimen - Patient has been educated on basic disease state pathophysiology and goals of therapy   Jerilyn Monte, PharmD, BCPS Heart Failure Stewardship Pharmacist Phone [phone]

## 2023-10-25 NOTE — Progress Notes (Signed)
 PROGRESS NOTE  Taylor TRITLE DGU:[id number] DOB: April 03, 1981   PCP: Theoplis Fix, MD  Patient is from: Home.  Lives with parents.  DOA: 10/17/2023 LOS: 8  Chief complaints No chief complaint on file.    Brief Narrative / Interim history: 43 year old F with PMH of CVA with some cognitive impairment, seizure disorder, DM-2, HTN, PAD, anxiety, depression, BPD, obesity and "thalamic pain syndrome" initially presented to [facility] with chest pain.  Workup concerning for unstable angina, acute hypoxic RF, bibasilar pneumonia, AKI and acute systolic CHF.  She was transferred to [facility] for further evaluation.  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  Deemed to be poor candidate by cardiothoracic surgery.  She had DES and PCI to mid and distal circumflex on 5/13.  Subjective: Seen and examined earlier this morning.  No major events overnight of this morning.  Complaining of headache, chest pain, back pain... Denies nausea, vomiting, photophobia or phonophobia.  She thinks she is anxious.  She is tearful but distractible.  Objective: Vitals:   10/25/23 0707 10/25/23 0736 10/25/23 1135 10/25/23 1136  BP:  (!) 102/58 93/65 93/65   Pulse:  90 75 74  Resp:  16 12 17   Temp:  97.7 F (36.5 C) 97.6 F (36.4 C) 97.6 F (36.4 C)  TempSrc:  Oral Oral   SpO2:  95% 100% 99%  Weight: 92 kg     Height:        Examination:  GENERAL: No apparent distress.  Nontoxic. HEENT: MMM.  Vision and hearing grossly intact.  NECK: Supple.  No apparent JVD.  RESP:  No IWOB.  Fair aeration bilaterally. CVS:  RRR. Heart sounds normal.  ABD/GI/GU: BS+. Abd soft, NTND.  MSK/EXT:  Moves extremities. No apparent deformity. No edema.  SKIN: no apparent skin lesion or wound NEURO: Awake, alert and oriented appropriately.  No apparent focal neuro deficit. PSYCH: Tearful but distractible.  Consultants:  Cardiology  Procedures: 5/9-LHC with multivessel CAD 5/13-DES and PCI to mid and distal  LCx  Microbiology summarized: A-20 pathogen RVP nonreactive  Assessment and plan: Acute respiratory failure probably with hypoxia 2/2 Lobar Pneumonia: -Completed 7 days of antibiotic course -Encourage incentive spirometry, OOB   Unstable angina/multivessel CAD-LHC on 5/9 with multivessel CAD.  Deemed to poor candidate for surgical intervention by CTS.  Underwent DES to mid LCx and PCI of distal LCx on 5/13.  Continues to endorse chest pain, although atypical. -On aspirin , Brilinta, statin and beta-blocker per cardiology -Ranexa added by cardiology    Acute kidney injury: Resolved. Recent Labs    10/19/23 0257 10/19/23 1642 10/20/23 0309 10/20/23 2325 10/21/23 1010 10/22/23 0218 10/23/23 1055 10/24/23 0248 10/24/23 2323 10/25/23 0925  BUN 26* 26* 23* 30* 27* 30* 22* 26* 25* 21*  CREATININE 0.96 1.37* 0.99 1.28* 0.91 0.98 0.81 1.00 1.18* 0.87  -Continue monitoring   Chronic systolic heart failure: TTE with LVEF of 35 to 40%, RWMA, G2 DD.  Appears euvolemic on exam. Farxiga  and ACE inhibitor were held due to acute kidney injury. Continue metoprolol . -Continue Lasix and Entresto per cardiology -Strict intake and output, daily weight, renal functions and electrolytes  Poorly controlled NIDDM-2 with hyperglycemia: A1c 8.0%.  Hyperglycemia likely due to dietary indiscretion and frequent snacking. Recent Labs  Lab 10/24/23 2108 10/25/23 0120 10/25/23 0612 10/25/23 0953 10/25/23 1124  GLUCAP 518* 447* 290* 311* 279*  -Counseled on the importance of dietary compliance -Increase Semglee  from 25 to 30 units twice daily -Continue SSI-resistant -Continue NovoLog  8  units 3 times daily with meals -Resume Farxiga  -Further adjustment as appropriate  Essential hypertension: Normotensive - Cardiac meds as above  Chronic central pain syndrome -Continue current home regimen. -Tramadol  might not be a good choice with history of seizure   Microcytic anemia:Stable.  Anemia panel  suggests iron  deficiency. Recent Labs    10/17/23 1625 10/18/23 0250 10/19/23 0257 10/20/23 0309 10/21/23 1010 10/22/23 0218 10/23/23 0228 10/24/23 0248 10/25/23 0925  HGB 9.6* 8.9* 8.7* 9.1* 8.9* 8.9* 9.4* 9.5* 9.1*  - Continue oral iron    Anxiety, depression and bipolar disorder: Reports feeling anxious. -Continue Valium , Prozac  and trazodone .   PAD (peripheral artery disease): -Now on aspirin  and Brilinta.     At high risk for falls: -PT/OT eval   Seizures (HCC) -Continue home Keppra  -Consider weaning of tramadol    Class I obesity Body mass index is 33.75 kg/m.           DVT prophylaxis:  heparin  injection 5,000 Units Start: 10/24/23 2200  Code Status: Full code Family Communication: None at bedside Level of care: Telemetry Cardiac Status is: Inpatient Remains inpatient appropriate because: Unstable angina and CHF   Final disposition: Home once cleared by cardiology   55 minutes with more than 50% spent in reviewing records, counseling patient/family and coordinating care.   Sch Meds:  Scheduled Meds:  aspirin  EC  81 mg Oral Daily   atorvastatin   80 mg Oral Daily   dapagliflozin  propanediol  10 mg Oral Daily   diazepam   5 mg Oral BID   ferrous gluconate   324 mg Oral Daily   FLUoxetine   10 mg Oral Daily   furosemide  40 mg Oral Daily   heparin   5,000 Units Subcutaneous Q8H   insulin  aspart  0-20 Units Subcutaneous TID WC   insulin  aspart  0-5 Units Subcutaneous QHS   insulin  aspart  8 Units Subcutaneous TID WC   insulin  glargine-yfgn  30 Units Subcutaneous BID   levETIRAcetam   1,000 mg Oral Daily   magnesium  oxide  400 mg Oral Daily   methocarbamol   500 mg Oral BID   metoprolol  succinate  50 mg Oral Daily   pantoprazole   40 mg Oral Daily   pregabalin   150 mg Oral TID   ranolazine  500 mg Oral BID   risperiDONE   0.5 mg Oral QHS   sacubitril-valsartan  1 tablet Oral BID   sodium chloride  flush  3 mL Intravenous Q12H   sodium chloride   flush  3 mL Intravenous Q12H   ticagrelor  90 mg Oral BID   traMADol   50 mg Oral QID   traZODone   200 mg Oral QHS   Continuous Infusions:  sodium chloride      PRN Meds:.sodium chloride , acetaminophen , albuterol , HYDROcodone -acetaminophen , morphine  injection, nitroGLYCERIN , ondansetron  (ZOFRAN ) IV, sodium chloride  flush, sodium chloride  flush, tiZANidine   Antimicrobials: Anti-infectives (From admission, onward)    Start     Dose/Rate Route Frequency Ordered Stop   10/21/23 1145  amoxicillin -clavulanate (AUGMENTIN) 875-125 MG per tablet 1 tablet  Status:  Discontinued        1 tablet Oral Every 12 hours 10/21/23 1055 10/21/23 1104   10/21/23 1145  amoxicillin -clavulanate (AUGMENTIN) 875-125 MG per tablet 1 tablet        1 tablet Oral Every 12 hours 10/21/23 1104 10/22/23 2126   10/19/23 1000  azithromycin  (ZITHROMAX ) tablet 500 mg  Status:  Discontinued        500 mg Oral Daily 10/19/23 0728 10/24/23 1031   10/18/23 0830  azithromycin  (  ZITHROMAX ) 500 mg in sodium chloride  0.9 % 250 mL IVPB  Status:  Discontinued        500 mg 250 mL/hr over 60 Minutes Intravenous Daily 10/18/23 0720 10/19/23 0728   10/18/23 0800  cefTRIAXone  (ROCEPHIN ) 1 g in sodium chloride  0.9 % 100 mL IVPB  Status:  Discontinued        1 g 200 mL/hr over 30 Minutes Intravenous Daily 10/18/23 0720 10/21/23 1055        I have personally reviewed the following labs and images: CBC: Recent Labs  Lab 10/21/23 1010 10/22/23 0218 10/23/23 0228 10/24/23 0248 10/25/23 0925  WBC 16.5* 13.2* 13.0* 14.3* 26.4*  HGB 8.9* 8.9* 9.4* 9.5* 9.1*  HCT 30.8* 30.8* 32.6* 31.3* 31.7*  MCV 69.2* 70.2* 70.0* 69.4* 70.8*  PLT 212 224 247 222 257   BMP &GFR Recent Labs  Lab 10/22/23 0218 10/23/23 1055 10/24/23 0248 10/24/23 2323 10/25/23 0925  NA 138 136 136 129* 131*  K 3.6 4.2 4.1 4.7 3.8  CL 97* 93* 95* 92* 95*  CO2 29 33* 29 24 25   GLUCOSE 241* 287* 213* 522* 318*  BUN 30* 22* 26* 25* 21*  CREATININE 0.98 0.81  1.00 1.18* 0.87  CALCIUM  9.1 9.0 9.6 8.9 9.2  MG  --   --   --   --  2.1  PHOS  --   --   --   --  3.5   Estimated Creatinine Clearance: 92.5 mL/min (by C-G formula based on SCr of 0.87 mg/dL). Liver & Pancreas: Recent Labs  Lab 10/25/23 0925  ALBUMIN 3.4*   No results for input(s): "LIPASE", "AMYLASE" in the last 168 hours. No results for input(s): "AMMONIA" in the last 168 hours. Diabetic: No results for input(s): "HGBA1C" in the last 72 hours. Recent Labs  Lab 10/24/23 2108 10/25/23 0120 10/25/23 0612 10/25/23 0953 10/25/23 1124  GLUCAP 518* 447* 290* 311* 279*   Cardiac Enzymes: No results for input(s): "CKTOTAL", "CKMB", "CKMBINDEX", "TROPONINI" in the last 168 hours. No results for input(s): "PROBNP" in the last 8760 hours. Coagulation Profile: No results for input(s): "INR", "PROTIME" in the last 168 hours. Thyroid Function Tests: No results for input(s): "TSH", "T4TOTAL", "FREET4", "T3FREE", "THYROIDAB" in the last 72 hours. Lipid Profile: No results for input(s): "CHOL", "HDL", "LDLCALC", "TRIG", "CHOLHDL", "LDLDIRECT" in the last 72 hours. Anemia Panel: No results for input(s): "VITAMINB12", "FOLATE", "FERRITIN", "TIBC", "IRON ", "RETICCTPCT" in the last 72 hours. Urine analysis:    Component Value Date/Time   COLORURINE YELLOW 02/04/2022 0559   APPEARANCEUR CLEAR 02/04/2022 0559   LABSPEC 1.020 02/04/2022 0559   PHURINE 6.0 02/04/2022 0559   GLUCOSEU >=500 (A) 02/04/2022 0559   HGBUR NEGATIVE 02/04/2022 0559   BILIRUBINUR NEGATIVE 02/04/2022 0559   KETONESUR NEGATIVE 02/04/2022 0559   PROTEINUR NEGATIVE 02/04/2022 0559   UROBILINOGEN 0.2 03/02/2012 1829   NITRITE NEGATIVE 02/04/2022 0559   LEUKOCYTESUR NEGATIVE 02/04/2022 0559   Sepsis Labs: Invalid input(s): "PROCALCITONIN", "LACTICIDVEN"  Microbiology: Recent Results (from the past 240 hours)  Respiratory (~20 pathogens) panel by PCR     Status: None   Collection Time: 10/17/23  7:55 PM    Specimen: Nasopharyngeal Swab; Respiratory  Result Value Ref Range Status   Adenovirus NOT DETECTED NOT DETECTED Final   Coronavirus 229E NOT DETECTED NOT DETECTED Final    Comment: (NOTE) The Coronavirus on the Respiratory Panel, DOES NOT test for the novel  Coronavirus (2019 nCoV)    Coronavirus HKU1 NOT DETECTED NOT DETECTED  Final   Coronavirus NL63 NOT DETECTED NOT DETECTED Final   Coronavirus OC43 NOT DETECTED NOT DETECTED Final   Metapneumovirus NOT DETECTED NOT DETECTED Final   Rhinovirus / Enterovirus NOT DETECTED NOT DETECTED Final   Influenza A NOT DETECTED NOT DETECTED Final   Influenza B NOT DETECTED NOT DETECTED Final   Parainfluenza Virus 1 NOT DETECTED NOT DETECTED Final   Parainfluenza Virus 2 NOT DETECTED NOT DETECTED Final   Parainfluenza Virus 3 NOT DETECTED NOT DETECTED Final   Parainfluenza Virus 4 NOT DETECTED NOT DETECTED Final   Respiratory Syncytial Virus NOT DETECTED NOT DETECTED Final   Bordetella pertussis NOT DETECTED NOT DETECTED Final   Bordetella Parapertussis NOT DETECTED NOT DETECTED Final   Chlamydophila pneumoniae NOT DETECTED NOT DETECTED Final   Mycoplasma pneumoniae NOT DETECTED NOT DETECTED Final    Comment: Performed at [facility], [address], [place], [place] [address]  MRSA Next Gen by PCR, Nasal     Status: None   Collection Time: 10/17/23  7:55 PM   Specimen: Nasal Mucosa; Nasal Swab  Result Value Ref Range Status   MRSA by PCR Next Gen NOT DETECTED NOT DETECTED Final    Comment: (NOTE) The GeneXpert MRSA Assay (FDA approved for NASAL specimens only), is one component of a comprehensive MRSA colonization surveillance program. It is not intended to diagnose MRSA infection nor to guide or monitor treatment for MRSA infections. Test performance is not FDA approved in patients less than 69 years old. Performed at [facility], [address], [place], [place] [address]     Radiology Studies: CARDIAC  CATHETERIZATION Result Date: 10/24/2023 Images from the original result were not included.   Dist LM lesion is 50% stenosed.   Prox LAD lesion is 70% stenosed.  1st Diag lesion is 70% stenosed-eccentric calcified.  Mid LAD lesion is 40% stenosed.   Ost Cx to Prox Cx lesion is 70% stenosed -eccentric calcified.   CULPRIT LESION: Mid Cx to Dist Cx lesion is 99% stenosed.   A drug-eluting stent was successfully placed using a STENT SYNERGY XD 2.50X12 -> postdilated in tapered fashion from 2.9-2.6 mm.  TIMI-3 flow maintained.Aaron Aas  Post intervention, there is a 0% residual stenosis.   Lesion #2: Dist Cx lesion is 99% stenosed.   Balloon angioplasty was performed using a BALLOON TAKERU 1.5X12.Post intervention, there is a 50% residual stenosis. Diagnostic: Dominance: Right     Intervention  Successful DES PCI of the most significant lesion-99% mid-distal LCx using Synergy XD 2.5 mm x 12 mm stent deployed in tapered fashion from 2.9-2.6 mm. Balloon angioplasty of 99% lesion distal to stent and small caliber vessel using 1.5 mm 12 mm balloon.  Reducing 99% to 50% stenosis. The decision to treat the most significant culprit lesion as the first PCI option was based on the extent of disease and potential difficulty of treating LAD and LCx with existing RCA disease.  Preferably she will do well with PCI of the 99% stenosis and medical management on the remainder of the vessels. RECOMMENDATIONS Anticipated discharge date to be determined. Plan will be to treat existing disease in the LM-ostial LCx, proximal LAD and diffuse RCA with medical management. If she were to have recurrent anginal symptoms after this PCI would consider debulking PCI of the proximal LAD crossing the diagonal branch but sparing the ostium of the LAD.  This would be step to a PCI if necessary. Step 3 PCI if she continues to have symptoms  despite mid LCx and proximal LAD PCI, with then consider distal LM-ostial LCx PCI from LM into LCx with provisional  PTCA/PCI of the ostial LAD although this would be preferably last choice to avoid left main PCI. Would recommend treating the RCA medically based on the diffuse nature of the disease as it would require essentially full metal jacket throughout the entire vessel to adequately treat. Recommend uninterrupted dual antiplatelet therapy with Aspirin  81mg  daily and Ticagrelor 90mg  twice daily for a minimum of 12 months (ACS-Class I recommendation). Would continue long-term Thienopyridine coverage after initial year based on extent of disease.  Can reduce to 60 mg twice daily Brilinta versus converting to clopidogrel  75 mg daily. Randene Bustard, MD, MS Randene Bustard, M.D., M.S. Interventional Cardiologist [facility] Pager # [phone]     Nyimah Shadduck T. Brytnee Bechler [facility]  If 7PM-7AM, please contact night-coverage [email or web address] 10/25/2023, 1:25 PM

## 2023-10-26 ENCOUNTER — Other Ambulatory Visit (HOSPITAL_COMMUNITY): Payer: Self-pay

## 2023-10-26 DIAGNOSIS — Z9181 History of falling: Secondary | ICD-10-CM | POA: Diagnosis not present

## 2023-10-26 DIAGNOSIS — J9601 Acute respiratory failure with hypoxia: Secondary | ICD-10-CM | POA: Diagnosis not present

## 2023-10-26 DIAGNOSIS — I259 Chronic ischemic heart disease, unspecified: Secondary | ICD-10-CM | POA: Diagnosis not present

## 2023-10-26 DIAGNOSIS — R072 Precordial pain: Secondary | ICD-10-CM | POA: Diagnosis not present

## 2023-10-26 LAB — MAGNESIUM: Magnesium: 2.1 mg/dL (ref 1.7–2.4)

## 2023-10-26 LAB — CBC
HCT: 32.6 % — ABNORMAL LOW (ref 36.0–46.0)
Hemoglobin: 9.3 g/dL — ABNORMAL LOW (ref 12.0–15.0)
MCH: 20.4 pg — ABNORMAL LOW (ref 26.0–34.0)
MCHC: 28.5 g/dL — ABNORMAL LOW (ref 30.0–36.0)
MCV: 71.3 fL — ABNORMAL LOW (ref 80.0–100.0)
Platelets: 246 10*3/uL (ref 150–400)
RBC: 4.57 MIL/uL (ref 3.87–5.11)
RDW: 20.3 % — ABNORMAL HIGH (ref 11.5–15.5)
WBC: 15.1 10*3/uL — ABNORMAL HIGH (ref 4.0–10.5)
nRBC: 0 % (ref 0.0–0.2)

## 2023-10-26 LAB — RENAL FUNCTION PANEL
Albumin: 3.4 g/dL — ABNORMAL LOW (ref 3.5–5.0)
Anion gap: 11 (ref 5–15)
BUN: 23 mg/dL — ABNORMAL HIGH (ref 6–20)
CO2: 28 mmol/L (ref 22–32)
Calcium: 9 mg/dL (ref 8.9–10.3)
Chloride: 99 mmol/L (ref 98–111)
Creatinine, Ser: 1.17 mg/dL — ABNORMAL HIGH (ref 0.44–1.00)
GFR, Estimated: 59 mL/min — ABNORMAL LOW (ref >60)
Glucose, Bld: 151 mg/dL — ABNORMAL HIGH (ref 70–99)
Phosphorus: 5.1 mg/dL — ABNORMAL HIGH (ref 2.5–4.6)
Potassium: 4.1 mmol/L (ref 3.5–5.1)
Sodium: 138 mmol/L (ref 135–145)

## 2023-10-26 LAB — GLUCOSE, CAPILLARY
Glucose-Capillary: 187 mg/dL — ABNORMAL HIGH (ref 70–99)
Glucose-Capillary: 139 mg/dL — ABNORMAL HIGH (ref 70–99)

## 2023-10-26 MED ORDER — METOPROLOL SUCCINATE ER 25 MG PO TB24
12.5000 mg | ORAL_TABLET | Freq: Every day | ORAL | 0 refills | Status: AC
Start: 2023-10-27 — End: ?
  Filled 2023-10-26: qty 45, 90d supply, fill #0

## 2023-10-26 MED ORDER — PANTOPRAZOLE SODIUM 40 MG PO TBEC
40.0000 mg | DELAYED_RELEASE_TABLET | Freq: Every day | ORAL | 0 refills | Status: AC
  Filled 2023-10-26: qty 30, 30d supply, fill #0

## 2023-10-26 MED ORDER — METOPROLOL SUCCINATE ER 25 MG PO TB24: 12.5000 mg | ORAL_TABLET | Freq: Every day | ORAL | Status: DC

## 2023-10-26 MED ORDER — NALOXONE HCL 0.4 MG/ML IJ SOLN: 0.4000 mg | INTRAMUSCULAR | Status: DC | PRN

## 2023-10-26 MED ORDER — RANOLAZINE ER 500 MG PO TB12
500.0000 mg | ORAL_TABLET | Freq: Two times a day (BID) | ORAL | 0 refills | Status: DC
  Filled 2023-10-26: qty 120, 60d supply, fill #0

## 2023-10-26 MED ORDER — TICAGRELOR 90 MG PO TABS
90.0000 mg | ORAL_TABLET | Freq: Two times a day (BID) | ORAL | 0 refills | Status: DC
  Filled 2023-10-26: qty 60, 30d supply, fill #0

## 2023-10-26 MED ORDER — LACTATED RINGERS IV BOLUS
250.0000 mL | Freq: Once | INTRAVENOUS | Status: AC
  Administered 2023-10-26: 250 mL via INTRAVENOUS

## 2023-10-26 NOTE — Evaluation (Signed)
 Occupational Therapy Evaluation and Discharge Patient Details Name: Taylor [name] MRN: [id number] DOB: Sep 09, 1980 Today's Date: 10/26/2023   History of Present Illness   43 y.o. female presents to [facility] 10/17/23 as transfer from [facility] for evaluation of chest pain. Pt with unstable angina, acute hypoxic RF, bibasilar PNA, AKI, and acute systolic CHF. 5/9 L heart cath and angiography. 5/13 DES and PCI to mid and distal circumflex. 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 Pt now presents with mildly decreased activity tolerance and mildly decreased balance, but at or very near baseline PLOF for ADLs. Pt currently demonstrating ability to complete ADLs Independent to Contact guard assist and functional mobility/transfers without an AD with close Supervision to Contact guard assist for safety. Pt has good family support with family reporting they are able and comfortable assisting pt at current functional level. Pt reporting mild intermittent dizziness during session with pt's BP soft but stable, HR in the 80s, and O2 >/94% throughout session. No further benefit from acute skilled OT services at this time. No post acute skilled OT needs anticipated. OT is signing off at this time.      If plan is discharge home, recommend the following:   A little help with walking and/or transfers;A little help with bathing/dressing/bathroom;Assistance with cooking/housework;Assist for transportation;Help with stairs or ramp for entrance;Direct supervision/assist for medications management;Direct  supervision/assist for financial management     Functional Status Assessment   Patient has had a recent decline in their functional status and demonstrates the ability to make significant improvements in function in a reasonable and predictable amount of time.     Equipment Recommendations   None recommended by OT (Pt already has needed equipment)     Recommendations for Other Services         Precautions/Restrictions   Precautions Precautions: Fall Restrictions Other Position/Activity Restrictions: R radial access for PCI on 5/13     Mobility Bed Mobility Overal bed mobility: Modified Independent             General bed mobility comments: with increased time    Transfers Overall transfer level: Needs assistance Equipment used: None Transfers: Sit to/from Stand, Bed to chair/wheelchair/BSC Sit to Stand: Supervision, Contact guard assist     Step pivot transfers: Supervision, Contact guard assist     General transfer comment: Supervision to CGA for safety      Balance Overall balance assessment: Mild deficits observed, not formally tested (no overt LOB noted during session)                                         ADL either performed or assessed with clinical judgement   ADL Overall ADL's : Needs assistance/impaired;Modified independent Eating/Feeding: Independent;Sitting   Grooming: Supervision/safety;Standing   Upper Body Bathing: Supervision/ safety;Sitting   Lower Body Bathing: Supervison/ safety;Contact guard assist;Sit to/from stand   Upper Body Dressing : Contact guard assist;Sitting Upper Body Dressing Details (indicate cue type and reason): assist for telemetry lines Lower Body Dressing: Supervision/safety;Contact guard assist;Sit to/from stand  Toilet Transfer: Supervision/safety;Contact guard assist;Ambulation;BSC/3in1 (without an AD) Statistician Details (indicate cue type and reason): close Supervision to [facility]  for safety Toileting- Clothing Manipulation and Hygiene: Modified independent;Supervision/safety;Sit to/from stand       Functional mobility during ADLs: Supervision/safety;Contact guard assist (without an AD; Close Supervision to CGA for safety) General ADL Comments: Pt with mildly decreased activity tolerance and mild noted balance deficits with no overt LOB noted. Pt reporting mild intermittent dizziness during tasks in standing/stepping with VSS on RA throughout.     Vision Baseline Vision/History: 0 No visual deficits Ability to See in Adequate Light: 0 Adequate Patient Visual Report: No change from baseline       Perception         Praxis         Pertinent Vitals/Pain Pain Assessment Pain Assessment: Faces Faces Pain Scale: Hurts a little bit Pain Location: chest Pain Descriptors / Indicators: Aching, Discomfort Pain Intervention(s): Limited activity within patient's tolerance, Monitored during session, Repositioned     Extremity/Trunk Assessment Upper Extremity Assessment Upper Extremity Assessment: Left hand dominant;Overall WFL for tasks assessed (gross B UE strength 4 to 4+/5)   Lower Extremity Assessment Lower Extremity Assessment: Defer to PT evaluation   Cervical / Trunk Assessment Cervical / Trunk Assessment: Other exceptions Cervical / Trunk Exceptions: increased body habitus   Communication Communication Communication: No apparent difficulties   Cognition Arousal: Alert Behavior During Therapy: WFL for tasks assessed/performed               OT - Cognition Comments: AAOx4 and pleasant throughout session. Pt cognition at baseline.                 Following commands: Intact       Cueing  General Comments   Cueing Techniques: Verbal cues;Gestural cues;Visual cues  BP soft but stable, HR in the 80s, and O2 >/94% throughout session. Pt's parents present at end of session.   Exercises     Shoulder Instructions      Home Living  Family/patient expects to be discharged to:: Private residence Living Arrangements: Parent Available Help at Discharge: Family;Available 24 hours/day Type of Home: House Home Access: Stairs to enter;Ramped entrance Entrance Stairs-Number of Steps: 2 Entrance Stairs-Rails: None Home Layout: One level     Bathroom Shower/Tub: Chief Strategy Officer: Standard     Home Equipment: Tub bench   Additional Comments: Pt enjoys spending time with her dog, playing games on her phone, and listening to music.      Prior Functioning/Environment Prior Level of Function : Needs assist             Mobility Comments: Ind with no AD, uses family for stair negotiation. Pt and family report no falls in past 6 months. ADLs Comments: Independent to Mod I with ADLs; family does to cooking and driving and assists with other IADLs as needed    OT Problem List:     OT Treatment/Interventions:        OT Goals(Current goals can be found in the care plan section)   Acute Rehab OT Goals Patient Stated Goal: to return home OT Goal Formulation: With patient/family   OT Frequency:       Co-evaluation PT/OT/SLP Co-Evaluation/Treatment: Yes Reason for Co-Treatment: For patient/therapist safety;To address functional/ADL transfers   OT goals addressed during session: ADL's and self-care      AM-PAC OT "6 Clicks" Daily Activity     Outcome Measure Help from another person  eating meals?: None Help from another person taking care of personal grooming?: A Little Help from another person toileting, which includes using toliet, bedpan, or urinal?: A Little Help from another person bathing (including washing, rinsing, drying)?: A Little Help from another person to put on and taking off regular upper body clothing?: A Little Help from another person to put on and taking off regular lower body clothing?: A Little 6 Click Score: 19   End of Session Equipment Utilized During Treatment: Gait  belt;Rolling walker (2 wheels) (brief use of RW while taking orthos) Nurse Communication: Mobility status  Activity Tolerance: Patient tolerated treatment well Patient left: in bed;with call bell/phone within reach;with family/visitor present  OT Visit Diagnosis: Unsteadiness on feet (R26.81);Other (comment) (decreased activity tolerance)                Time: 1040-1058 OT Time Calculation (min): 18 min Charges:  OT General Charges $OT Visit: 1 Visit OT Evaluation $OT Eval Low Complexity: 1 Low  Edwing Figley "Kyle" M., OTR/L, MA Acute Rehab [phone]  Walt Gunner 10/26/2023, 4:06 PM

## 2023-10-26 NOTE — Progress Notes (Signed)
 Progress Note  Patient Name: Taylor [name] Date of Encounter: 10/26/2023  Primary Cardiologist: [name], MD   Subjective   Patient seen and examined at her bedside.  Status post PCI - DES to the LCX this admission. Transient hypotension overnight.  Inpatient Medications    Scheduled Meds:  aspirin  EC  81 mg Oral Daily   atorvastatin   80 mg Oral Daily   dapagliflozin  propanediol  10 mg Oral Daily   diazepam   5 mg Oral BID   ferrous gluconate   324 mg Oral Daily   FLUoxetine   10 mg Oral Daily   heparin   5,000 Units Subcutaneous Q8H   insulin  aspart  0-20 Units Subcutaneous TID WC   insulin  aspart  0-5 Units Subcutaneous QHS   insulin  aspart  8 Units Subcutaneous TID WC   insulin  glargine-yfgn  30 Units Subcutaneous BID   levETIRAcetam   1,000 mg Oral Daily   magnesium  oxide  400 mg Oral Daily   [START ON 10/27/2023] metoprolol  succinate  12.5 mg Oral Daily   pantoprazole   40 mg Oral Daily   pregabalin   150 mg Oral TID   ranolazine  500 mg Oral BID   risperiDONE   0.5 mg Oral QHS   sodium chloride  flush  3 mL Intravenous Q12H   sodium chloride  flush  3 mL Intravenous Q12H   ticagrelor  90 mg Oral BID   traMADol   50 mg Oral QID   traZODone   200 mg Oral QHS   Continuous Infusions:   PRN Meds: acetaminophen , albuterol , HYDROcodone -acetaminophen , naLOXone (NARCAN)  injection, nitroGLYCERIN , ondansetron  (ZOFRAN ) IV, sodium chloride  flush, sodium chloride  flush, tiZANidine    Vital Signs    Vitals:   10/26/23 0530 10/26/23 0705 10/26/23 0828 10/26/23 0835  BP: (!) 96/57 (!) 82/62 113/73   Pulse: 68 68 84 80  Resp: 14 15 (!) 27 15  Temp:  97.8 F (36.6 C)    TempSrc:  Oral    SpO2: 95% 96% 97% 96%  Weight:      Height:        Intake/Output Summary (Last 24 hours) at 10/26/2023 1116 Last data filed at 10/26/2023 0900 Gross per 24 hour  Intake 963 ml  Output 2600 ml  Net -1637 ml   Filed Weights   10/23/23 0615 10/24/23 0517 10/25/23 0707  Weight: 92.2 kg  92.3 kg 92 kg    Telemetry    Sinus rhythm- Personally Reviewed  ECG     - Personally Reviewed  Physical Exam    General: Comfortable Head: Atraumatic, normal size  Eyes: PEERLA, EOMI  Neck: Supple, normal JVD Cardiac: Normal S1, S2; RRR; no murmurs, rubs, or gallops Lungs: Clear to auscultation bilaterally Abd: Soft, nontender, no hepatomegaly  Ext: warm, no edema Musculoskeletal: No deformities, BUE and BLE strength normal and equal Skin: Warm and dry, no rashes   Neuro: Alert and oriented to person, place, time, and situation, CNII-XII grossly intact, no focal deficits  Psych: Normal mood and affect   Labs    Chemistry Recent Labs  Lab 10/24/23 2323 10/25/23 0925 10/26/23 0235  NA 129* 131* 138  K 4.7 3.8 4.1  CL 92* 95* 99  CO2 24 25 28   GLUCOSE 522* 318* 151*  BUN 25* 21* 23*  CREATININE 1.18* 0.87 1.17*  CALCIUM  8.9 9.2 9.0  ALBUMIN  --  3.4* 3.4*  GFRNONAA 58* >60 59*  ANIONGAP 13 11 11      Hematology Recent Labs  Lab 10/24/23 0248 10/25/23 0925 10/26/23  0235  WBC 14.3* 26.4* 15.1*  RBC 4.51 4.48 4.57  HGB 9.5* 9.1* 9.3*  HCT 31.3* 31.7* 32.6*  MCV 69.4* 70.8* 71.3*  MCH 21.1* 20.3* 20.4*  MCHC 30.4 28.7* 28.5*  RDW 19.2* 19.7* 20.3*  PLT 222 257 246    Cardiac EnzymesNo results for input(s): "TROPONINI" in the last 168 hours. No results for input(s): "TROPIPOC" in the last 168 hours.   BNP No results for input(s): "BNP", "PROBNP" in the last 168 hours.    DDimer No results for input(s): "DDIMER" in the last 168 hours.   Radiology    CARDIAC CATHETERIZATION Result Date: 10/24/2023 Images from the original result were not included.   Dist LM lesion is 50% stenosed.   Prox LAD lesion is 70% stenosed.  1st Diag lesion is 70% stenosed-eccentric calcified.  Mid LAD lesion is 40% stenosed.   Ost Cx to Prox Cx lesion is 70% stenosed -eccentric calcified.   CULPRIT LESION: Mid Cx to Dist Cx lesion is 99% stenosed.   A drug-eluting stent was  successfully placed using a STENT SYNERGY XD 2.50X12 -> postdilated in tapered fashion from 2.9-2.6 mm.  TIMI-3 flow maintained.Aaron Aas  Post intervention, there is a 0% residual stenosis.   Lesion #2: Dist Cx lesion is 99% stenosed.   Balloon angioplasty was performed using a BALLOON TAKERU 1.5X12.Post intervention, there is a 50% residual stenosis. Diagnostic: Dominance: Right     Intervention  Successful DES PCI of the most significant lesion-99% mid-distal LCx using Synergy XD 2.5 mm x 12 mm stent deployed in tapered fashion from 2.9-2.6 mm. Balloon angioplasty of 99% lesion distal to stent and small caliber vessel using 1.5 mm 12 mm balloon.  Reducing 99% to 50% stenosis. The decision to treat the most significant culprit lesion as the first PCI option was based on the extent of disease and potential difficulty of treating LAD and LCx with existing RCA disease.  Preferably she will do well with PCI of the 99% stenosis and medical management on the remainder of the vessels. RECOMMENDATIONS Anticipated discharge date to be determined. Plan will be to treat existing disease in the LM-ostial LCx, proximal LAD and diffuse RCA with medical management. If she were to have recurrent anginal symptoms after this PCI would consider debulking PCI of the proximal LAD crossing the diagonal branch but sparing the ostium of the LAD.  This would be step to a PCI if necessary. Step 3 PCI if she continues to have symptoms despite mid LCx and proximal LAD PCI, with then consider distal LM-ostial LCx PCI from LM into LCx with provisional PTCA/PCI of the ostial LAD although this would be preferably last choice to avoid left main PCI. Would recommend treating the RCA medically based on the diffuse nature of the disease as it would require essentially full metal jacket throughout the entire vessel to adequately treat. Recommend uninterrupted dual antiplatelet therapy with Aspirin  81mg  daily and Ticagrelor 90mg  twice daily for a minimum of  12 months (ACS-Class I recommendation). Would continue long-term Thienopyridine coverage after initial year based on extent of disease.  Can reduce to 60 mg twice daily Brilinta versus converting to clopidogrel  75 mg daily. Randene Bustard, MD, MS Randene Bustard, M.D., M.S. Interventional Cardiologist [facility] Pager # [phone]    Cardiac Studies   Echocardiogram, coronary CTA, left heart catheterization  Patient Profile     43 y.o. female female with severe multivessel coronary artery disease, ischemic cardiomyopathy EF 35 to 40%, peripheral artery disease,  hyperlipidemia, diabetes, history of CVA  Assessment & Plan    Severe multivessel coronary artery disease Ischemic cardiomyopathy EF 35 to 40% Hyperlipidemia Diabetes mellitus History of CVA Peripheral artery disease status post left femoral-popliteal artery bypass in 2023  Recent heart catheterization showed severe multivessel disease she was seen by cardiothoracic surgery who deemed the patient to pull surgical candidate- she is status post PCI to the LCX artery, she tells me her chest pain has significantly improved though not completely resolved.  I will add Ranexa 500 mg twice daily.  Heparin  stopped , continue Aspirin  and Brillinta. Continue current dose of statin.  Hypotensive overnight and still hypotensive: will adjust medication. Stop entresto. Cut back on toprol  oto 12.5 mg daily. Continue to hold Aldactone  Heart failure with reduced ejection fraction-recent echo showed EF of 35 to 40% which is a drop from previous which was 45 to 50%.  GDMT can not be titrated or optimized due to blood pressure.    Hyperlipidemia - continue with current statin medication.  LDL at target.  Peripheral artery disease-continue current medication regimen.  History of CVA continue current medication regimen.  Pneumonia per hospitalist team. Diabetes per hospitalist team.     For questions or updates, please contact  [facility] Please consult [email or web address] for contact info under Cardiology/STEMI.      Signed, Aliyanna Wassmer, DO  10/26/2023, 11:16 AM

## 2023-10-26 NOTE — Progress Notes (Signed)
 Dr Brock Canner was  made aware that BP= 75/45. MAP=55. HR=67. Pt is drowsy. Received Narco at 0220. Pt was given LR 250 bolus. EKG was done. Post Bolus SBP= 95. Pt is easily arousable.Taylor [name]

## 2023-10-26 NOTE — Telephone Encounter (Signed)
 Note updated

## 2023-10-26 NOTE — Discharge Summary (Signed)
 Physician Discharge Summary  Taylor [name] ZOX:[id number] DOB: [birth year] DOA: 10/17/2023  PCP: Theoplis Fix, MD  Admit date: 10/17/2023 Discharge date: 10/26/23  Admitted From: Home Disposition: Home Recommendations for Outpatient Follow-up:  Outpatient follow-up with PCP and cardiology as below Check blood pressure, fluid status, CMP and CBC at follow-up Patient is at risk for polypharmacy.  Reviewed medications. Patient with history of seizure disorder.  Tramadol  can decrease seizure threshold.  Reassess need Please follow up on the following pending results: None  Home Health: No need identified Equipment/Devices: No need identified  Discharge Condition: Stable CODE STATUS: Full code  Follow-up Information     [facility]. Go in 8 day(s).   Specialty: Cardiology Why: Hospital follow up 11/03/2023 @ 11:45 am PLEASE bring a current medication list to appointment FREE valet parking, entrance C, ( [facility]) off [employer]) [employer] information: [address] [place]  [address] [phone]        Theoplis Fix, MD. Schedule an appointment as soon as possible for a visit in 1 week(s).   Specialty: Internal Medicine Contact information: [address]  [place] [address] [phone]                 Hospital course 43 year old F with PMH of CVA with some cognitive impairment, seizure disorder, DM-2, HTN, PAD, anxiety, depression, BPD, obesity and "thalamic pain syndrome" initially presented to [facility] with chest pain.  Workup concerning for unstable angina, acute hypoxic RF, bibasilar pneumonia, AKI and acute systolic CHF.  She was transferred to [facility] for further evaluation.   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  Deemed to be poor candidate by cardiothoracic surgery.  She had DES and PCI to mid and distal circumflex on 5/13.  Also diuresed with IV Lasix  and transition to p.o.  On the day of discharge, patient felt well and ready to go home.  Cleared for discharge by cardiology on low-dose metoprolol , Brilinta and aspirin .  Entresto and Aldactone discontinued.  Outpatient follow-up with cardiology as above.  See individual problem list below for more.   Problems addressed during this hospitalization Acute respiratory failure probably with hypoxia 2/2 Lobar Pneumonia: Resolved. -Completed 7 days of antibiotic course   Unstable angina/multivessel CAD-LHC on 5/9 with multivessel CAD.  Deemed to poor candidate for surgical intervention by CTS.  Underwent DES to mid LCx and PCI of distal LCx on 5/13.  Continues to endorse chest pain, although atypical. - Discharged on aspirin , Brilinta, Ranexa, low-dose metoprolol  and statin per cardiology.     Acute kidney injury: Likely due to diuretics. Recent Labs    10/19/23 1642 10/20/23 0309 10/20/23 2325 10/21/23 1010 10/22/23 0218 10/23/23 1055 10/24/23 0248 10/24/23 2323 10/25/23 0925 10/26/23 0235  BUN 26* 23* 30* 27* 30* 22* 26* 25* 21* 23*  CREATININE 1.37* 0.99 1.28* 0.91 0.98 0.81 1.00 1.18* 0.87 1.17*  - Entresto, Lasix and Aldactone discontinued. - Recheck renal function at follow-up  Chronic systolic heart failure: TTE with LVEF of 35 to 40%, RWMA, G2-DD.  Appears euvolemic on exam.  Lasix, Entresto and Aldactone discontinued due to soft blood pressure and AKI. -Continue Farxiga  -Reassess BP, fluid status and renal functions at follow-up   Poorly controlled NIDDM-2 with hyperglycemia: A1c 8.0%.  Hyperglycemia likely due to dietary indiscretion and frequent snacking.  Improved. -Continue home meds including Farxiga    Essential hypertension: Soft BP. 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  She is at risk for polypharmacy.  Reassess meds   Microcytic anemia:Stable.  Anemia panel suggests iron  deficiency. - Continue oral iron     Anxiety, depression and bipolar disorder: Reports feeling anxious. -Continue Valium , Prozac  and trazodone .   PAD (peripheral artery disease): -Now on aspirin  and Brilinta.   At high risk for falls: High risk for polypharmacy -Discussed with patient.  Reassess needs at follow-up   Seizure disorder: On Keppra  at home. -Discussed with patient about risk of seizure with tramadol .  Encouraged to discuss this with his PCP/prescriber     Class I obesity Body mass index is 33.75 kg/m.               Time spent 35 minutes  Vital signs Vitals:   10/26/23 0705 10/26/23 0828 10/26/23 0835 10/26/23 1138  BP: (!) 82/62 113/73  (!) 98/52  Pulse: 68 84 80 79  Temp: 97.8 F (36.6 C)   97.6 F (36.4 C)  Resp: 15 (!) 27 15 15   Height:      Weight:      SpO2: 96% 97% 96% 95%  TempSrc: Oral   Oral  BMI (Calculated):         Discharge exam  GENERAL: No apparent distress.  Nontoxic. HEENT: MMM.  Vision and hearing grossly intact.  NECK: Supple.  No apparent JVD.  RESP:  No IWOB.  Fair aeration bilaterally. CVS:  RRR. Heart sounds normal.  ABD/GI/GU: BS+. Abd soft, NTND.  MSK/EXT:  Moves extremities. No apparent deformity. No edema.  SKIN: no apparent skin lesion or wound NEURO: Awake and alert. Oriented appropriately.  No apparent focal neuro deficit. PSYCH: Calm. Normal affect.   Discharge Instructions Discharge Instructions     Amb Referral to Cardiac Rehabilitation   Complete by: As directed    Diagnosis:  Coronary Stents PTCA     After initial evaluation and assessments completed: Virtual Based Care may be provided alone or in conjunction with Phase 2 Cardiac Rehab based on patient barriers.: Yes   Intensive Cardiac Rehabilitation (ICR) [facility] location only OR Traditional Cardiac Rehabilitation (TCR) *If criteria for ICR are not met will enroll in TCR [facility] only): Yes   Diet - low sodium heart healthy   Complete by: As directed    Diet Carb Modified   Complete by: As  directed    Discharge instructions   Complete by: As directed    It has been a pleasure taking care of you!  You were hospitalized due to chest pain and heart failure.  You had stent placed for your heart.  We made some changes to your home medication during this hospitalization.  Please review your new medication list and the directions on your medications before you take them.  Is very important that you have your diabetes under good control.   Note that tramadol  can increase your risk of seizure.  Please discuss this with your prescriber.  Note that a combination of methocarbamol , tramadol , Norco, Lyrica , trazodone , Zanaflex , respirator, diazepam  can increase your risk of drowsiness, sedation, constipation, impaired judgment, respiratory depression that could potentially lead to death and impaired balance that could increase risk of fall. We strongly recommend talking to your doctors who prescribe them. We do not recommend driving, operating machinery or other activity that requires similar mental and physical engagement.     Take care,   Increase activity slowly   Complete by: As directed  Allergies as of 10/26/2023       Reactions   Amitriptyline Other (See Comments)   Unresponsive    Contrast Media [iodinated Contrast Media] Other (See Comments)   2013-had CVA, went into AFR-combination ABT, contrast, low BP   Morphine  And Codeine Other (See Comments)   Chest pain   Promethazine  Swelling   Lips swell   Compazine Other (See Comments)   -Alert but unable to move   Prochlorperazine Maleate Other (See Comments)   -Alert, but unable to move   Adhesive [tape] Rash   Long term exposure causes skin to tear    Oxycodone  Nausea And Vomiting   Severe n/v -  patient will not take oxycodone  or percocet.   Sumatriptan Rash        Medication List     STOP taking these medications    clopidogrel  75 MG tablet Commonly known as: PLAVIX    labetalol  300 MG tablet Commonly  known as: NORMODYNE    lisinopril  40 MG tablet Commonly known as: ZESTRIL    methocarbamol  500 MG tablet Commonly known as: ROBAXIN    omeprazole 20 MG capsule Commonly known as: PRILOSEC Replaced by: pantoprazole  40 MG tablet   spironolactone 25 MG tablet Commonly known as: ALDACTONE       TAKE these medications    aspirin  EC 81 MG tablet Take 81 mg by mouth daily.   atorvastatin  80 MG tablet Commonly known as: LIPITOR  TAKE 1 TABLET BY MOUTH DAILY   blood glucose meter kit and supplies Dispense based on patient and insurance preference. Use up to four times daily as directed. (FOR ICD-10 E10.9, E11.9).   Brilinta 90 MG Tabs tablet Generic drug: ticagrelor Take 1 tablet (90 mg total) by mouth 2 (two) times daily.   diazepam  5 MG tablet Commonly known as: VALIUM  Take 5 mg by mouth 2 (two) times daily.   Farxiga  10 MG Tabs tablet Generic drug: dapagliflozin  propanediol Take 10 mg by mouth daily.   ferrous gluconate  324 MG tablet Commonly known as: FERGON Take 324 mg by mouth daily.   FLUoxetine  10 MG capsule Commonly known as: PROZAC  Take 10 mg by mouth in the morning.   HYDROcodone -acetaminophen  5-325 MG tablet Commonly known as: NORCO/VICODIN Take 1 tablet by mouth every 6 (six) hours as needed for severe pain.   hydrOXYzine 25 MG capsule Commonly known as: VISTARIL Take 25 mg by mouth 3 (three) times daily.   levETIRAcetam  500 MG 24 hr tablet Commonly known as: KEPPRA  XR Take 2 tablets (1,000 mg total) by mouth daily.   magnesium  oxide 400 MG tablet Commonly known as: MAG-OX Take 400 mg by mouth daily.   metFORMIN  500 MG tablet Commonly known as: GLUCOPHAGE  Take 500 mg by mouth 3 (three) times daily.   metoprolol  succinate 25 MG 24 hr tablet Commonly known as: TOPROL -XL Take 0.5 tablets (12.5 mg total) by mouth daily. Start taking on: Oct 27, 2023   pantoprazole  40 MG tablet Commonly known as: PROTONIX  Take 1 tablet (40 mg total) by mouth  daily. Start taking on: Oct 27, 2023 Replaces: omeprazole 20 MG capsule   pregabalin  150 MG capsule Commonly known as: LYRICA  TAKE 1 CAPSULE BY MOUTH THREE TIMES DAILY   ranolazine 500 MG 12 hr tablet Commonly known as: RANEXA Take 1 tablet (500 mg total) by mouth 2 (two) times daily.   risperiDONE  0.5 MG tablet Commonly known as: RISPERDAL  Take 0.5 mg by mouth at bedtime.   tiZANidine  2 MG tablet Commonly known as: ZANAFLEX   TAKE 1 TABLET BY MOUTH AT BEDTIME AS NEEDED FOR MUSCLE SPASMS   traMADol  50 MG tablet Commonly known as: ULTRAM  Take 50 mg by mouth 4 (four) times daily.   traZODone  100 MG tablet Commonly known as: DESYREL  Take 200 mg by mouth at bedtime.        Consultations: Cardiology  Procedures/Studies: 5/9-LHC with multivessel CAD 5/13-DES and PCI to mid and distal LCx   CARDIAC CATHETERIZATION Result Date: 10/24/2023 Images from the original result were not included.   Dist LM lesion is 50% stenosed.   Prox LAD lesion is 70% stenosed.  1st Diag lesion is 70% stenosed-eccentric calcified.  Mid LAD lesion is 40% stenosed.   Ost Cx to Prox Cx lesion is 70% stenosed -eccentric calcified.   CULPRIT LESION: Mid Cx to Dist Cx lesion is 99% stenosed.   A drug-eluting stent was successfully placed using a STENT SYNERGY XD 2.50X12 -> postdilated in tapered fashion from 2.9-2.6 mm.  TIMI-3 flow maintained.Aaron Aas  Post intervention, there is a 0% residual stenosis.   Lesion #2: Dist Cx lesion is 99% stenosed.   Balloon angioplasty was performed using a BALLOON TAKERU 1.5X12.Post intervention, there is a 50% residual stenosis. Diagnostic: Dominance: Right     Intervention  Successful DES PCI of the most significant lesion-99% mid-distal LCx using Synergy XD 2.5 mm x 12 mm stent deployed in tapered fashion from 2.9-2.6 mm. Balloon angioplasty of 99% lesion distal to stent and small caliber vessel using 1.5 mm 12 mm balloon.  Reducing 99% to 50% stenosis. The decision to treat the  most significant culprit lesion as the first PCI option was based on the extent of disease and potential difficulty of treating LAD and LCx with existing RCA disease.  Preferably she will do well with PCI of the 99% stenosis and medical management on the remainder of the vessels. RECOMMENDATIONS Anticipated discharge date to be determined. Plan will be to treat existing disease in the LM-ostial LCx, proximal LAD and diffuse RCA with medical management. If she were to have recurrent anginal symptoms after this PCI would consider debulking PCI of the proximal LAD crossing the diagonal branch but sparing the ostium of the LAD.  This would be step to a PCI if necessary. Step 3 PCI if she continues to have symptoms despite mid LCx and proximal LAD PCI, with then consider distal LM-ostial LCx PCI from LM into LCx with provisional PTCA/PCI of the ostial LAD although this would be preferably last choice to avoid left main PCI. Would recommend treating the RCA medically based on the diffuse nature of the disease as it would require essentially full metal jacket throughout the entire vessel to adequately treat. Recommend uninterrupted dual antiplatelet therapy with Aspirin  81mg  daily and Ticagrelor 90mg  twice daily for a minimum of 12 months (ACS-Class I recommendation). Would continue long-term Thienopyridine coverage after initial year based on extent of disease.  Can reduce to 60 mg twice daily Brilinta versus converting to clopidogrel  75 mg daily. Randene Bustard, MD, MS Randene Bustard, M.D., M.S. Interventional Cardiologist [facility] Pager # [phone]  ECHOCARDIOGRAM COMPLETE Result Date: 10/20/2023    ECHOCARDIOGRAM REPORT   Patient Name:   Taylor [name] Date of Exam: 10/20/2023 Medical Rec #:  [id number]     Height:       65.0 in Accession #:    [id number]    Weight:       213.4 lb Date of Birth:  Oct 19, 1980      BSA:  2.033 m Patient Age:    43 years      BP:           130/79 mmHg Patient Gender: F              HR:           74 bpm. Exam Location:  Inpatient Procedure: 2D Echo (Both Spectral and Color Flow Doppler were utilized during            procedure). Indications:    CAD of native vessel  History:        Patient has no prior history of Echocardiogram examinations.                 CAD, PAD, Signs/Symptoms:Chest Pain; Risk Factors:Hypertension.  Sonographer:    Dione Franks RDCS Referring Phys: [id number] STEVEN C HENDRICKSON IMPRESSIONS  1. Septal , mid/basal inferior wall and lateral wall hypokinesis . Left ventricular ejection fraction, by estimation, is 35 to 40%. The left ventricle has moderately decreased function. The left ventricle demonstrates regional wall motion abnormalities (see scoring diagram/findings for description). The left ventricular internal cavity size was mildly dilated. Left ventricular diastolic parameters are consistent with Grade II diastolic dysfunction (pseudonormalization). Elevated left ventricular end-diastolic pressure.  2. Right ventricular systolic function is normal. The right ventricular size is normal.  3. Left atrial size was moderately dilated.  4. Moderate subvalvular calcification of the posterior leaflet chords . The mitral valve is abnormal. Trivial mitral valve regurgitation. No evidence of mitral stenosis.  5. The aortic valve is tricuspid. Aortic valve regurgitation is not visualized. No aortic stenosis is present.  6. The inferior vena cava is normal in size with greater than 50% respiratory variability, suggesting right atrial pressure of 3 mmHg. FINDINGS  Left Ventricle: Septal , mid/basal inferior wall and lateral wall hypokinesis. Left ventricular ejection fraction, by estimation, is 35 to 40%. The left ventricle has moderately decreased function. The left ventricle demonstrates regional wall motion abnormalities. Strain was performed and the global longitudinal strain is indeterminate. The left ventricular internal cavity size was mildly dilated. There is  no left ventricular hypertrophy. Left ventricular diastolic parameters are consistent with Grade II diastolic dysfunction (pseudonormalization). Elevated left ventricular end-diastolic pressure. Right Ventricle: The right ventricular size is normal. No increase in right ventricular wall thickness. Right ventricular systolic function is normal. Left Atrium: Left atrial size was moderately dilated. Right Atrium: Right atrial size was normal in size. Pericardium: Trivial pericardial effusion is present. The pericardial effusion is posterior to the left ventricle. Mitral Valve: Moderate subvalvular calcification of the posterior leaflet chords. The mitral valve is abnormal. There is mild thickening of the mitral valve leaflet(s). There is mild calcification of the mitral valve leaflet(s). Mild mitral annular calcification. Trivial mitral valve regurgitation. No evidence of mitral valve stenosis. Tricuspid Valve: The tricuspid valve is normal in structure. Tricuspid valve regurgitation is trivial. No evidence of tricuspid stenosis. Aortic Valve: The aortic valve is tricuspid. Aortic valve regurgitation is not visualized. No aortic stenosis is present. Pulmonic Valve: The pulmonic valve was normal in structure. Pulmonic valve regurgitation is not visualized. No evidence of pulmonic stenosis. Aorta: The aortic root is normal in size and structure. Venous: The inferior vena cava is normal in size with greater than 50% respiratory variability, suggesting right atrial pressure of 3 mmHg. IAS/Shunts: No atrial level shunt detected by color flow Doppler. Additional Comments: 3D was performed not requiring image post processing on an independent workstation and was indeterminate.  LEFT VENTRICLE PLAX 2D LVIDd:         5.60 cm     Diastology LVIDs:         4.50 cm     LV e' medial:    5.77 cm/s LV PW:         1.10 cm     LV E/e' medial:  17.2 LV IVS:        1.00 cm     LV e' lateral:   6.09 cm/s LVOT diam:     2.20 cm     LV E/e'  lateral: 16.3 LV SV:         65 LV SV Index:   32 LVOT Area:     3.80 cm  LV Volumes (MOD) LV vol d, MOD A4C: 98.2 ml LV vol s, MOD A4C: 63.8 ml LV SV MOD A4C:     98.2 ml RIGHT VENTRICLE             IVC RV Basal diam:  2.50 cm     IVC diam: 1.70 cm RV S prime:     13.80 cm/s TAPSE (M-mode): 2.4 cm LEFT ATRIUM             Index        RIGHT ATRIUM           Index LA diam:        5.00 cm 2.46 cm/m   RA Area:     12.10 cm LA Vol (A2C):   43.9 ml 21.59 ml/m  RA Volume:   26.50 ml  13.03 ml/m LA Vol (A4C):   88.4 ml 43.47 ml/m LA Biplane Vol: 66.0 ml 32.46 ml/m  AORTIC VALVE LVOT Vmax:   78.50 cm/s LVOT Vmean:  52.800 cm/s LVOT VTI:    0.170 m  AORTA Ao Root diam: 2.90 cm Ao Asc diam:  2.70 cm MITRAL VALVE MV Area (PHT): 4.39 cm    SHUNTS MV Decel Time: 173 msec    Systemic VTI:  0.17 m MV E velocity: 99.40 cm/s  Systemic Diam: 2.20 cm MV A velocity: 66.30 cm/s MV E/A ratio:  1.50 Janelle Mediate MD Electronically signed by Janelle Mediate MD Signature Date/Time: 10/20/2023/4:52:13 PM    Final    CARDIAC CATHETERIZATION Result Date: 10/20/2023   Dist LM lesion is 50% stenosed.   Prox LAD lesion is 80% stenosed.   1st Diag lesion is 70% stenosed.   Mid LAD lesion is 40% stenosed.   Ost Cx to Prox Cx lesion is 80% stenosed.   Mid Cx lesion is 99% stenosed.   Dist Cx lesion is 99% stenosed.   Dist RCA lesion is 80% stenosed.   Prox RCA lesion is 65% stenosed.   Mid RCA lesion is 50% stenosed. 1.  Severe multivessel disease. 2.  LVEDP of 38 mmHg. Recommendation: Intensive diuresis and cardiothoracic surgical evaluation.   CT CORONARY FRACTIONAL FLOW RESERVE FLUID ANALYSIS Result Date: 10/20/2023 EXAM: CT FFR ANALYSIS CLINICAL DATA:  abnormal imaging of coronary circulation FINDINGS: FFRct analysis was performed on the original cardiac CT angiogram dataset. Diagrammatic representation of the FFRct analysis is provided in a separate PDF document in PACS. This dictation was created using the PDF document and an interactive  3D model of the results. 3D model is not available in the EMR/PACS. Normal FFR range is >0.80. 1. Left Main:  No significant stenosis. FFR = 1.00 2. LAD: Significant flow limitation in mid and distal vessel. Proximal FFR = 0.97,  Mid FFR = 0.71, Distal FFR = <0.5 3. LCX: Significant flow limitation in distal vessel. Proximal FFR = 0.98, mid = 0.86, Distal FFR = 0.63 4. RCA: Significant flow limitation in mid and distal vessel. Proximal FFR = 0.99, Mid FFR = 0.78, Distal FFR = 0.67 IMPRESSION: 1. Severe diffuse plaque throughout proximal LAD, LCx, and RCA. This leads to downstream flow limitations in all three vessels as noted above. 2. Total plaque volume 1666 mm3 which is 100th percentile for age- and sex-matched controls (calcified plaque 546 mm3; non-calcified plaque 1120 mm3). TPV is extensive. Electronically Signed   By: Sheryle Donning M.D.   On: 10/20/2023 07:24   CT CORONARY MORPH W/CTA COR W/SCORE W/CA W/CM &/OR WO/CM Addendum Date: 10/19/2023 ADDENDUM REPORT: 10/19/2023 14:46 EXAM: OVER-READ INTERPRETATION  CT CHEST The following report is an over-read performed by radiologist Dr. Kriss Peter [facility] on 10/19/2023. This over-read does not include interpretation of cardiac or coronary anatomy or pathology. The coronary CTA interpretation by the cardiologist is attached. COMPARISON:  Chest radiograph dated 07/30/2019. FINDINGS: The visualized thoracic aorta and pulmonary arteries appear unremarkable. No hilar or mediastinal adenopathy identified. The visualized esophagus is unremarkable. The visualized lungs are clear. No acute findings in the visualized upper abdomen. No acute osseous pathology. IMPRESSION: No acute extracardiac findings. Electronically Signed   By: Angus Bark M.D.   On: 10/19/2023 14:46   Result Date: 10/19/2023 HISTORY: Chest pain, nonspecific EXAM: Cardiac/Coronary CT TECHNIQUE: The patient was scanned on a [employer]. PROTOCOL: A 120 kV  prospective scan was triggered in the descending thoracic aorta at 111 HU's. Axial non-contrast 3 mm slices were carried out through the heart. The data set was analyzed on a dedicated work station and scored using the Agatston method. Gantry rotation speed was 250 msecs and collimation was 0.6 mm. Heart rate was optimized medically and sl NTG was given. The 3D data set was reconstructed in 5% intervals of the 35-75 % of the R-R cycle. Systolic and diastolic phases were analyzed on a dedicated work station using MPR, MIP and VRT modes. The patient received 95mL OMNIPAQUE  IOHEXOL  350 MG/ML SOLN of contrast. FINDINGS: Coronary calcium  score: The patient's coronary artery calcium  score is 2837, which places the patient in the 99th percentile. Coronary arteries: Normal coronary origins.  Right dominance. Right Coronary Artery: Normal caliber vessel, gives rise to PDA. Diffuse mixed calcified and noncalcified plaque throughout vessel. Several areas of focal stenosis in proximal and mid portions of vessel of at least 50-69%, may be more severe, limited by interference from calcification. Left Main Coronary Artery: Normal caliber vessel. Mixed calcified and noncalcified plaque with 1-24% stenosis. Left Anterior Descending Coronary Artery: Normal caliber vessel. Diffused mixed calcified and noncalcified plaque throughout vessel, most prominent in proximal and mid portions. Several areas in proximal LAD concerning for 70-99% stenosis. Mid portion with at least 50-69% stenosis, cannot excluded higher grade stenosis due to artifact from calcium . Gives rise to large first diagonal branch with calcified and noncalcified plaque, stenosis not well visualized due to calcification. Left Circumflex Artery: Normal caliber vessel. Diffused mixed calcified and noncalcified plaque throughout vessel, most prominent in proximal and mid portions. Gives rise to 2 OM branches, both with significant mixed calcified and noncalcified plaque,  stenosis not well visualized. Aorta: Normal size. Aortic atherosclerosis. No dissection seen in visualized portions of the aorta. Aortic Valve: No calcifications. Trileaflet. Other findings: Normal pulmonary vein drainage into the left atrium. Normal left atrial appendage without  a thrombus. Normal size of the pulmonary artery. Normal appearance of the pericardium. Mild mitral annular calcification IMPRESSION: 1. Diffuse severe CAD, CADRADS = 4. CT FFR will be performed and reported separately. 70-99% proximal LAD, with significant diffuse stenosis in RCA of at least 50-69% and possibly more severe. Calcification causes significant limitations to determination of stenosis. 2. Coronary calcium  score of 2837. This was 99th percentile for age-, sex-, and race- matched controls. 3. Total plaque volume information pending, will be added to FFR report once complete. 4. Normal coronary origin with right dominance. Findings communicated with Dr. Lavonne Prairie. INTERPRETATION: CAD-RADS 4: Severe stenosis. (70-99% or > 50% left main). Cardiac catheterization or CT FFR is recommended. Consider symptom-guided anti-ischemic pharmacotherapy as well as risk factor modification per guideline directed care. Invasive coronary angiography recommended with revascularization per published guideline statements. Electronically Signed: By: Sheryle Donning M.D. On: 10/19/2023 14:33       The results of significant diagnostics from this hospitalization (including imaging, microbiology, ancillary and laboratory) are listed below for reference.     Microbiology: Recent Results (from the past 240 hours)  Respiratory (~20 pathogens) panel by PCR     Status: None   Collection Time: 10/17/23  7:55 PM   Specimen: Nasopharyngeal Swab; Respiratory  Result Value Ref Range Status   Adenovirus NOT DETECTED NOT DETECTED Final   Coronavirus 229E NOT DETECTED NOT DETECTED Final    Comment: (NOTE) The Coronavirus on the Respiratory  Panel, DOES NOT test for the novel  Coronavirus (2019 nCoV)    Coronavirus HKU1 NOT DETECTED NOT DETECTED Final   Coronavirus NL63 NOT DETECTED NOT DETECTED Final   Coronavirus OC43 NOT DETECTED NOT DETECTED Final   Metapneumovirus NOT DETECTED NOT DETECTED Final   Rhinovirus / Enterovirus NOT DETECTED NOT DETECTED Final   Influenza A NOT DETECTED NOT DETECTED Final   Influenza B NOT DETECTED NOT DETECTED Final   Parainfluenza Virus 1 NOT DETECTED NOT DETECTED Final   Parainfluenza Virus 2 NOT DETECTED NOT DETECTED Final   Parainfluenza Virus 3 NOT DETECTED NOT DETECTED Final   Parainfluenza Virus 4 NOT DETECTED NOT DETECTED Final   Respiratory Syncytial Virus NOT DETECTED NOT DETECTED Final   Bordetella pertussis NOT DETECTED NOT DETECTED Final   Bordetella Parapertussis NOT DETECTED NOT DETECTED Final   Chlamydophila pneumoniae NOT DETECTED NOT DETECTED Final   Mycoplasma pneumoniae NOT DETECTED NOT DETECTED Final    Comment: Performed at [facility], [address], [place], [place] [address]  MRSA Next Gen by PCR, Nasal     Status: None   Collection Time: 10/17/23  7:55 PM   Specimen: Nasal Mucosa; Nasal Swab  Result Value Ref Range Status   MRSA by PCR Next Gen NOT DETECTED NOT DETECTED Final    Comment: (NOTE) The GeneXpert MRSA Assay (FDA approved for NASAL specimens only), is one component of a comprehensive MRSA colonization surveillance program. It is not intended to diagnose MRSA infection nor to guide or monitor treatment for MRSA infections. Test performance is not FDA approved in patients less than 32 years old. Performed at [facility], [address], [place], [place] [address]      Labs:  CBC: Recent Labs  Lab 10/22/23 0218 10/23/23 0228 10/24/23 0248 10/25/23 0925 10/26/23 0235  WBC 13.2* 13.0* 14.3* 26.4* 15.1*  HGB 8.9* 9.4* 9.5* 9.1* 9.3*  HCT 30.8* 32.6* 31.3* 31.7* 32.6*  MCV 70.2* 70.0* 69.4* 70.8* 71.3*  PLT 224 247 222 257 246  BMP &GFR Recent Labs  Lab 10/23/23 1055 10/24/23 0248 10/24/23 2323 10/25/23 0925 10/26/23 0235  NA 136 136 129* 131* 138  K 4.2 4.1 4.7 3.8 4.1  CL 93* 95* 92* 95* 99  CO2 33* 29 24 25 28   GLUCOSE 287* 213* 522* 318* 151*  BUN 22* 26* 25* 21* 23*  CREATININE 0.81 1.00 1.18* 0.87 1.17*  CALCIUM  9.0 9.6 8.9 9.2 9.0  MG  --   --   --  2.1 2.1  PHOS  --   --   --  3.5 5.1*   Estimated Creatinine Clearance: 68.8 mL/min (A) (by C-G formula based on SCr of 1.17 mg/dL (H)). Liver & Pancreas: Recent Labs  Lab 10/25/23 0925 10/26/23 0235  ALBUMIN 3.4* 3.4*   No results for input(s): "LIPASE", "AMYLASE" in the last 168 hours. No results for input(s): "AMMONIA" in the last 168 hours. Diabetic: No results for input(s): "HGBA1C" in the last 72 hours. Recent Labs  Lab 10/25/23 1611 10/25/23 1942 10/25/23 2111 10/26/23 0604 10/26/23 1137  GLUCAP 164* 174* 249* 187* 139*   Cardiac Enzymes: No results for input(s): "CKTOTAL", "CKMB", "CKMBINDEX", "TROPONINI" in the last 168 hours. No results for input(s): "PROBNP" in the last 8760 hours. Coagulation Profile: No results for input(s): "INR", "PROTIME" in the last 168 hours. Thyroid Function Tests: No results for input(s): "TSH", "T4TOTAL", "FREET4", "T3FREE", "THYROIDAB" in the last 72 hours. Lipid Profile: No results for input(s): "CHOL", "HDL", "LDLCALC", "TRIG", "CHOLHDL", "LDLDIRECT" in the last 72 hours. Anemia Panel: No results for input(s): "VITAMINB12", "FOLATE", "FERRITIN", "TIBC", "IRON ", "RETICCTPCT" in the last 72 hours. Urine analysis:    Component Value Date/Time   COLORURINE YELLOW 02/04/2022 0559   APPEARANCEUR CLEAR 02/04/2022 0559   LABSPEC 1.020 02/04/2022 0559   PHURINE 6.0 02/04/2022 0559   GLUCOSEU >=500 (A) 02/04/2022 0559   HGBUR NEGATIVE 02/04/2022 0559   BILIRUBINUR NEGATIVE 02/04/2022 0559   KETONESUR NEGATIVE 02/04/2022 0559   PROTEINUR NEGATIVE 02/04/2022 0559   UROBILINOGEN 0.2 03/02/2012  1829   NITRITE NEGATIVE 02/04/2022 0559   LEUKOCYTESUR NEGATIVE 02/04/2022 0559   Sepsis Labs: Invalid input(s): "PROCALCITONIN", "LACTICIDVEN"   SIGNED:  Theadore Finger, MD  [facility] 10/26/2023, 1:33 PM

## 2023-10-26 NOTE — TOC Transition Note (Signed)
 Transition of Care (TOC) - Discharge Note Sherin Dingwall RN, BSN Transitions of Care Unit 4E- RN Case Manager See Treatment Team for direct phone # Cross Coverage for 2C  Patient Details  Name: MARNETTA MONSMA MRN: [id number] Date of Birth: 05-Jan-1981  Transition of [facility]) CM/SW Contact:  Rox Cope, RN Phone Number: 10/26/2023, 12:14 PM   Clinical Narrative:    Pt stable for transition home today, No HH or DME needs noted.  Family to transport home.  Pt lives with parents.     Final next level of care: Home/Self Care Barriers to Discharge: Barriers Resolved   Patient Goals and CMS Choice Patient states their goals for this hospitalization and ongoing recovery are:: mother want patient to return home   Choice offered to / list presented to : NA      Discharge Placement               Home        Discharge Plan and Services Additional resources added to the After Visit Summary for     Discharge Planning Services: CM Consult Post Acute Care Choice: NA          DME Arranged: N/A DME Agency: NA       HH Arranged: NA HH Agency: NA        Social Drivers of Health (SDOH) Interventions SDOH Screenings   Food Insecurity: No Food Insecurity (06/30/2023)   Received from [facility]  Housing: Unknown (10/25/2023)  Transportation Needs: No Transportation Needs (10/25/2023)  Utilities: Low Risk  (06/30/2023)   Received from [facility]  Alcohol Screen: Low Risk  (10/25/2023)  Financial Resource Strain: Low Risk  (10/25/2023)  Physical Activity: Insufficiently Active (06/30/2023)   Received from [facility]  Social Connections: Patient Unable To Answer (10/18/2023)  Stress: No Stress Concern Present (06/30/2023)   Received from [facility]  Tobacco Use: Medium Risk (10/17/2023)  Health Literacy: Medium Risk (06/30/2023)   Received from [facility]     Readmission Risk Interventions    10/26/2023   12:09 PM 02/09/2022   11:34 AM   Readmission Risk Prevention Plan  Post Dischage Appt  Complete  Medication Screening  Complete  Transportation Screening Complete Complete  HRI or Home Care Consult Complete   Social Work Consult for Recovery Care Planning/Counseling Complete   Palliative Care Screening Not Applicable   Medication Review Oceanographer) Complete

## 2023-10-26 NOTE — Progress Notes (Signed)
   Heart Failure Stewardship Pharmacist Progress Note   PCP: Theoplis Fix, MD PCP-Cardiologist: [name], MD    HPI:  43 yo F with PMH of CHF, PAD, partial left foot amputation, HTN, recurrent CVAs with memory loss, T2DM, previous smoker, seizure disorder, and anemia.  Transferred from [facility] on 5/6 with complaints of chest pain, shortness of breath, and LE edema. Also with PNA on CTA findings. CXR with component of CHF. proBNP 336. Coronary CT on 5/8 showed diffuse severe CAD, CAC 99th percentile. Underwent LHC on 5/9 and confirmed multivessel disease, LVEDP 38. CT surgery was consulted and no plans for CABG. ECHO 5/9 showed LVEF 35-40% (was 45-50% in 09/2023), RWMA, G2DD, RV normal. S/p high risk PCI with DES to LCx on 5/13.   Met with patient and her parents today. States that her breathing is improved from yesterday. No LE edema on exam. Transient hypotension overnight. Reviewed updates to GDMT since prior to admission again and completed dietary education again.  Current HF Medications: Beta Blocker: metoprolol  XL 12.5 mg daily SGLT2i: Farxiga  10 mg daily  Prior to admission HF Medications: ACE/ARB/ARNI: lisinopril  40 mg daily MRA: spironolactone 12.5 mg daily SGLT2i: Farxiga  10 mg daily  Pertinent Lab Values: Serum creatinine 1.17, BUN 23, Potassium 4.1, Sodium 138, proBNP 336, BNP 353.2, A1c 8   Vital Signs: Weight: 202 lbs (admission weight: 212 lbs) Blood pressure: 90/60s  Heart rate: 80s  I/O: net -1.4L yesterday; net +2.8L since admission  Medication Assistance / Insurance Benefits Check: Does the patient have prescription insurance?  Yes Type of insurance plan: [place] Medicaid  Outpatient Pharmacy:  Prior to admission outpatient pharmacy: [facility] Is the patient willing to use [facility] TOC pharmacy at discharge? Yes Is the patient willing to transition their outpatient pharmacy to utilize a [facility] outpatient pharmacy?   No    Assessment: 1. Acute on  chronic systolic CHF (LVEF 35-40%), due to ICM. NYHA class II symptoms. - Holding further lasix today. Strict I/Os and daily weights. Keep K>4 and Mg>2. - Reduced metoprolol  XL from 50 mg to 12.5 mg daily and are holding Entresto with hypotension. - Consider adding spironolactone pending improvement in BP - Agree with restarting Farxiga  10 mg daily   Plan: 1) Medication changes recommended at this time: - Agree with changes - Start spironolactone 12.5 mg daily pending improvement in BP - prior to discharge vs at follow up  2) Patient assistance: - None pending, has [place] Medicaid  3)  Education  - Patient has been educated on current HF medications and potential additions to HF medication regimen - Patient verbalizes understanding that over the next few months, these medication doses may change and more medications may be added to optimize HF regimen - Patient has been educated on basic disease state pathophysiology and goals of therapy   Jerilyn Monte, PharmD, BCPS Heart Failure Stewardship Pharmacist Phone [phone]

## 2023-10-26 NOTE — Evaluation (Signed)
 Physical Therapy Evaluation Patient Details Name: Taylor [name] MRN: [id number] DOB: [birth year] Today's Date: 10/26/2023  History of Present Illness  43 y.o. female presents to [facility] 10/17/23 as transfer from [facility] for evaluation of chest pain. Pt with unstable angina, acute hypoxic RF, bibasilar PNA, AKI, and acute systolic CHF. 5/9 L heart cath and angiography. 5/13 DES and PCI to mid and distal circumflex. 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 PTA, pt ambulated with no AD and had help from family for stair negotiation. In today's session, pt was able to stand with supervision/CGA and no AD and ambulate 80 ft. Pt reported being close to functional mobility baseline with a tendency to stagger right/left. Pt attributes this to prior partial L foot amputation. Pt has 24/7 physical assist available upon return home. Anticipating no post-acute PT needs as pt is near baseline. Pt currently with functional limitations due to the deficits listed below (see PT Problem List). Pt would benefit from acute skilled PT to address functional impairments. Acute PT to follow.  Sitting BP- 86/63 Standing BP- 106/85        If plan is discharge home, recommend the following: A little help with walking and/or transfers;A little help with bathing/dressing/bathroom;Assistance with cooking/housework;Help with stairs or ramp for entrance;Assist for transportation   Can travel by private vehicle    Yes    Equipment Recommendations None recommended by PT     Functional Status Assessment Patient has had a recent decline in their functional status and demonstrates the ability to make significant improvements in function in a reasonable  and predictable amount of time.     Precautions / Restrictions Precautions Precautions: Fall Restrictions Other Position/Activity Restrictions: R radial access for PCI on 5/13      Mobility  Bed Mobility Overal bed mobility: Modified Independent    General bed mobility comments: with increased time    Transfers Overall transfer level: Needs assistance Equipment used: None Transfers: Sit to/from Stand Sit to Stand: Supervision, Contact guard assist  General transfer comment: Supervision to CGA for safety    Ambulation/Gait Ambulation/Gait assistance: Contact guard assist Gait Distance (Feet): 80 Feet Assistive device: None Gait Pattern/deviations: Step-through pattern, Staggering left, Staggering right Gait velocity: decr     General Gait Details: occasionally staggers left/right which pt attribute to prior L partial foot amputation. CGA for safety     Balance Overall balance assessment: Mild deficits observed, not formally tested (no overt LOB noted during session)         Pertinent Vitals/Pain Pain Assessment Pain Assessment: Faces Faces Pain Scale: Hurts a little bit Pain Location: chest Pain Descriptors / Indicators: Aching, Discomfort Pain Intervention(s): Limited activity within patient's tolerance, Monitored during session, Repositioned    Home Living Family/patient expects to be discharged to:: Private residence Living Arrangements: Parent Available Help at Discharge: Family;Available 24 hours/day Type of Home: House Home Access: Stairs to enter;Ramped entrance Entrance Stairs-Rails: None Entrance Stairs-Number of Steps: 2   Home Layout: One level Home Equipment: Tub bench Additional Comments: Pt enjoys spending time with her dog, playing games on her phone, and listening to music.    Prior Function Prior Level of Function : Needs assist    Mobility Comments: Ind with no AD, uses family  for stair negotiation. Pt and family report no falls in  past 6 months. ADLs Comments: Independent to Mod I with ADLs; family does to cooking and driving and assists with other IADLs as needed     Extremity/Trunk Assessment   Upper Extremity Assessment Upper Extremity Assessment: Defer to OT evaluation    Lower Extremity Assessment Lower Extremity Assessment: Overall WFL for tasks assessed (prior L foot partial amputation)    Cervical / Trunk Assessment Cervical / Trunk Assessment: Other exceptions Cervical / Trunk Exceptions: increased body habitus  Communication   Communication Communication: No apparent difficulties    Cognition Arousal: Alert Behavior During Therapy: WFL for tasks assessed/performed   PT - Cognitive impairments: No apparent impairments  Following commands: Intact       Cueing Cueing Techniques: Verbal cues, Gestural cues, Visual cues     General Comments General comments (skin integrity, edema, etc.): Pt requested to stay in room. Mimicked stair negotiation with pt marching in place while holding onto bed rail.     PT Assessment Patient needs continued PT services  PT Problem List Decreased activity tolerance;Decreased balance;Decreased mobility       PT Treatment Interventions DME instruction;Gait training;Stair training;Functional mobility training;Therapeutic activities;Therapeutic exercise;Balance training;Neuromuscular re-education;Patient/family education    PT Goals (Current goals can be found in the Care Plan section)  Acute Rehab PT Goals Patient Stated Goal: to go home PT Goal Formulation: With patient/family Time For Goal Achievement: 11/09/23 Potential to Achieve Goals: Good    Frequency Min 1X/week     Co-evaluation   Reason for Co-Treatment: For patient/therapist safety;To address functional/ADL transfers PT goals addressed during session: Balance         AM-PAC PT "6 Clicks" Mobility  Outcome Measure Help needed turning from your back to your side while in a flat bed  without using bedrails?: None Help needed moving from lying on your back to sitting on the side of a flat bed without using bedrails?: None Help needed moving to and from a bed to a chair (including a wheelchair)?: A Little Help needed standing up from a chair using your arms (e.g., wheelchair or bedside chair)?: A Little Help needed to walk in hospital room?: A Little Help needed climbing 3-5 steps with a railing? : A Lot 6 Click Score: 19    End of Session Equipment Utilized During Treatment: Gait belt Activity Tolerance: Patient tolerated treatment well Patient left: in bed;with call bell/phone within reach;with family/visitor present Nurse Communication: Mobility status PT Visit Diagnosis: Unsteadiness on feet (R26.81);Other abnormalities of gait and mobility (R26.89)    Time: 1040-1058 PT Time Calculation (min) (ACUTE ONLY): 18 min   Charges:   PT Evaluation $PT Eval Low Complexity: 1 Low   PT General Charges $$ ACUTE PT VISIT: 1 Visit       Orysia Blas, PT, DPT Secure Chat Preferred  Rehab Office [phone]   Alissa April Adela Ades 10/26/2023, 11:55 AM

## 2023-10-26 NOTE — Telephone Encounter (Signed)
 Sorry.  Did not submit a miscommunication.  This was that I was told by the providers caring for her.  That is not the reason why we did not do the stents.  It was more because of the difficulty in doing these procedures were were not sure what is actually causing symptoms.  We treated the most significant blockage.  The idea is that with diabetes, stents are not always the best option especially in the high risk locations that you would need them.  The goal is to treat symptoms and try to avoid unnecessary risky procedures.  DH

## 2023-10-26 NOTE — Progress Notes (Addendum)
 TRH night cross cover note:   I was notified by the patient's RN that the patient's blood pressure is now running slightly softer, with most recent blood pressure reported to be 75/45, relative to his systolic blood pressures in the 90s earlier during this evening's night shift.  Additional vital signs at this time appear stable, including afebrile, with heart rates in the 60s; respiratory rate 16, oxygen saturation in the mid 90s on room air.  RN conveys that the patient appears slightly drowsy, and notes that the patient received as needed Norco on top of her scheduled scheduled tramadol  approximately 2 hours ago.   She has a net negative fluid balance over the last 24 hours of -2.4 L.  This morning's lab's are notable for interval acute kidney injury, with prerenal azotemia.  Magnesium  level is 2.1.  White blood cell count is trending down, now 15,100 compared to 26,400 yesterday, will hemoglobin inferior stable at 9.3 compared to 9.1 yesterday morning.  Multiple new antihypertensive medications noted in the context of echocardiogram on 10/20/2023, which was notable for LVEF 35 to 40% as well as grade 2 diastolic dysfunction and normal right ventricular systolic function, following which the patient underwent PCI with drug-eluting stent placement to the left circumflex on 10/24/2023.  In addition to scheduled tramadol  and prn Norco, which may contribute to relative hypotension in setting of potential fluid shift, the patient is also on scheduled Lyrica , Robaxin , Keppra , diazepam .   Will order a small 250 cc LR bolus at this time. STAT EKG ordered.  Additionally, in setting of aki, will also check UA.      Camelia Cavalier, DO Hospitalist

## 2023-10-26 NOTE — Plan of Care (Signed)
  Problem: Education: Goal: Knowledge of General Education information will improve Description: Including pain rating scale, medication(s)/side effects and non-pharmacologic comfort measures Outcome: Progressing   Problem: Health Behavior/Discharge Planning: Goal: Ability to manage health-related needs will improve Outcome: Progressing   Problem: Clinical Measurements: Goal: Ability to maintain clinical measurements within normal limits will improve Outcome: Progressing Goal: Will remain free from infection Outcome: Progressing Goal: Diagnostic test results will improve Outcome: Progressing Goal: Respiratory complications will improve Outcome: Progressing Goal: Cardiovascular complication will be avoided Outcome: Progressing   Problem: Activity: Goal: Risk for activity intolerance will decrease Outcome: Progressing   Problem: Nutrition: Goal: Adequate nutrition will be maintained Outcome: Progressing   Problem: Coping: Goal: Level of anxiety will decrease Outcome: Progressing   Problem: Elimination: Goal: Will not experience complications related to bowel motility Outcome: Progressing Goal: Will not experience complications related to urinary retention Outcome: Progressing   Problem: Pain Managment: Goal: General experience of comfort will improve and/or be controlled Outcome: Progressing   Problem: Safety: Goal: Ability to remain free from injury will improve Outcome: Progressing   Problem: Skin Integrity: Goal: Risk for impaired skin integrity will decrease Outcome: Progressing   Problem: Education: Goal: Ability to describe self-care measures that may prevent or decrease complications (Diabetes Survival Skills Education) will improve Outcome: Progressing Goal: Individualized Educational Video(s) Outcome: Progressing   Problem: Coping: Goal: Ability to adjust to condition or change in health will improve Outcome: Progressing   Problem: Fluid  Volume: Goal: Ability to maintain a balanced intake and output will improve Outcome: Progressing   Problem: Health Behavior/Discharge Planning: Goal: Ability to identify and utilize available resources and services will improve Outcome: Progressing Goal: Ability to manage health-related needs will improve Outcome: Progressing   Problem: Metabolic: Goal: Ability to maintain appropriate glucose levels will improve Outcome: Progressing   Problem: Nutritional: Goal: Maintenance of adequate nutrition will improve Outcome: Progressing Goal: Progress toward achieving an optimal weight will improve Outcome: Progressing   Problem: Skin Integrity: Goal: Risk for impaired skin integrity will decrease Outcome: Progressing   Problem: Tissue Perfusion: Goal: Adequacy of tissue perfusion will improve Outcome: Progressing   Problem: Education: Goal: Understanding of cardiac disease, CV risk reduction, and recovery process will improve Outcome: Progressing Goal: Individualized Educational Video(s) Outcome: Progressing   Problem: Activity: Goal: Ability to tolerate increased activity will improve Outcome: Progressing   Problem: Cardiac: Goal: Ability to achieve and maintain adequate cardiovascular perfusion will improve Outcome: Progressing   Problem: Health Behavior/Discharge Planning: Goal: Ability to safely manage health-related needs after discharge will improve Outcome: Progressing   Problem: Education: Goal: Understanding of CV disease, CV risk reduction, and recovery process will improve Outcome: Progressing Goal: Individualized Educational Video(s) Outcome: Progressing   Problem: Activity: Goal: Ability to return to baseline activity level will improve Outcome: Progressing   Problem: Cardiovascular: Goal: Ability to achieve and maintain adequate cardiovascular perfusion will improve Outcome: Progressing Goal: Vascular access site(s) Level 0-1 will be  maintained Outcome: Progressing   Problem: Health Behavior/Discharge Planning: Goal: Ability to safely manage health-related needs after discharge will improve Outcome: Progressing

## 2023-11-03 ENCOUNTER — Ambulatory Visit (HOSPITAL_COMMUNITY)
Admit: 2023-11-03 | Discharge: 2023-11-03 | Disposition: A | Discharge status: Home / Self Care | Source: Ambulatory Visit | Attending: Adult Health | Admitting: Adult Health

## 2023-11-03 ENCOUNTER — Ambulatory Visit (HOSPITAL_COMMUNITY): Payer: Self-pay | Admitting: Adult Health

## 2023-11-03 ENCOUNTER — Encounter (HOSPITAL_COMMUNITY): Payer: Self-pay

## 2023-11-03 VITALS — BP 108/60 | HR 90 | Wt 219.2 lb

## 2023-11-03 DIAGNOSIS — Z7902 Long term (current) use of antithrombotics/antiplatelets: Secondary | ICD-10-CM | POA: Insufficient documentation

## 2023-11-03 DIAGNOSIS — I25119 Atherosclerotic heart disease of native coronary artery with unspecified angina pectoris: Secondary | ICD-10-CM

## 2023-11-03 DIAGNOSIS — I255 Ischemic cardiomyopathy: Secondary | ICD-10-CM | POA: Insufficient documentation

## 2023-11-03 DIAGNOSIS — I25110 Atherosclerotic heart disease of native coronary artery with unstable angina pectoris: Secondary | ICD-10-CM | POA: Insufficient documentation

## 2023-11-03 DIAGNOSIS — I5022 Chronic systolic (congestive) heart failure: Secondary | ICD-10-CM | POA: Diagnosis not present

## 2023-11-03 DIAGNOSIS — Z7984 Long term (current) use of oral hypoglycemic drugs: Secondary | ICD-10-CM | POA: Insufficient documentation

## 2023-11-03 DIAGNOSIS — Z8673 Personal history of transient ischemic attack (TIA), and cerebral infarction without residual deficits: Secondary | ICD-10-CM | POA: Diagnosis not present

## 2023-11-03 DIAGNOSIS — Z955 Presence of coronary angioplasty implant and graft: Secondary | ICD-10-CM | POA: Diagnosis not present

## 2023-11-03 DIAGNOSIS — G8929 Other chronic pain: Secondary | ICD-10-CM | POA: Diagnosis not present

## 2023-11-03 DIAGNOSIS — G40909 Epilepsy, unspecified, not intractable, without status epilepticus: Secondary | ICD-10-CM | POA: Diagnosis not present

## 2023-11-03 DIAGNOSIS — I110 Hypertensive heart disease with heart failure: Secondary | ICD-10-CM | POA: Insufficient documentation

## 2023-11-03 DIAGNOSIS — Z87891 Personal history of nicotine dependence: Secondary | ICD-10-CM | POA: Diagnosis not present

## 2023-11-03 DIAGNOSIS — I739 Peripheral vascular disease, unspecified: Secondary | ICD-10-CM | POA: Insufficient documentation

## 2023-11-03 DIAGNOSIS — I509 Heart failure, unspecified: Secondary | ICD-10-CM | POA: Diagnosis present

## 2023-11-03 LAB — BASIC METABOLIC PANEL WITH GFR
Anion gap: 7 (ref 5–15)
BUN: 14 mg/dL (ref 6–20)
CO2: 26 mmol/L (ref 22–32)
Calcium: 9.4 mg/dL (ref 8.9–10.3)
Chloride: 105 mmol/L (ref 98–111)
Creatinine, Ser: 0.89 mg/dL (ref 0.44–1.00)
GFR, Estimated: >60 mL/min (ref >60)
Glucose, Bld: 149 mg/dL — ABNORMAL HIGH (ref 70–99)
Potassium: 4.4 mmol/L (ref 3.5–5.1)
Sodium: 138 mmol/L (ref 135–145)

## 2023-11-03 MED ORDER — NITROGLYCERIN 0.4 MG SL SUBL: 0.4000 mg | SUBLINGUAL_TABLET | SUBLINGUAL | 3 refills | Status: AC | PRN

## 2023-11-03 NOTE — Progress Notes (Signed)
 HEART & VASCULAR TRANSITION OF CARE CONSULT NOTE     Referring Physician: Theoplis Fix, MD   Chief Complaint: Heart Failure   HPI: Referred to clinic by Dr Gonfa for heart failure consultation.   Taylor [name] is a 43 y.o. female with a history of CAD, strokes, seizure disorder, chronic pain, HTN, and PAD.   Admitted with unstable agina. Underwent cath with multivessel disease. CT surgery did not feel like she was a candidate for CABG. She underwent DES to mid and distal circumflex. Echo EF 35-40%. GDMT at d/c 2/4 with Toprol  Xl and Farxiga .   She is here today for [facility] HF visit. Complaining of fatigue but her parents tell me this is her baseline. Gets dizzy every now and then. Occasionally short of breath. Denies PND/Orthopnea. Appetite ok. No fever or chills. Taking all medications. Lives with her parents.    Cardiac Testing  10/2023 Echo 35-40% RV normal    Cath5/2025, reviewed  Plan will be to treat existing disease in the LM-ostial LCx, proximal LAD and diffuse RCA with medical management. If she were to have recurrent anginal symptoms after this PCI would consider debulking PCI of the proximal LAD crossing the diagonal branch but sparing the ostium of the LAD.  This would be step to a PCI if necessary. Step 3 PCI if she continues to have symptoms despite mid LCx and proximal LAD PCI, with then consider distal LM-ostial LCx PCI from LM into LCx with provisional PTCA/PCI of the ostial LAD although this would be preferably last choice to avoid left main PCI. Would recommend treating the RCA medically based on the diffuse nature of the disease as it would require essentially full metal jacket throughout the entire vessel to adequately treat. Recommend uninterrupted dual antiplatelet therapy with Aspirin  81mg  daily and Ticagrelor  90mg  twice daily for a minimum of 12 months (ACS-Class I recommendation). Would continue long-term Thienopyridine coverage after initial year based on  extent of disease.  Can reduce to 60 mg twice daily Brilinta  versus converting to clopidogrel  75 mg daily. 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   due to elevated blood sugar 1700-off insulin  6 months   Depression    Diabetes mellitus    off insulin  6 months   Diverticulitis    Dyspnea    when walking   GERD (gastroesophageal reflux disease)    Hypertension    Kidney dialysis status    on dialysis(stroke) off after 4-6 weeks   Pneumonia 2013, 06/2018   Respiratory failure (HCC) 04/2012   with stroke - in hosp [facility] 12 weeks   Seizures (HCC)    during elvated blood sugsr episode 11/13   Sepsis (HCC)    Stroke (HCC)    11/2018, 12/2018 Weak left side, speech- slurred, Short term memeroy, Gait unsteady    Vocal cord dysfunction     Current Outpatient Medications  Medication Sig Dispense Refill   aspirin  81 MG EC tablet Take 81 mg by mouth daily.      atorvastatin  (LIPITOR ) 80 MG tablet TAKE 1 TABLET BY MOUTH DAILY 30 tablet 0   blood glucose meter kit and supplies Dispense based on patient and insurance preference. Use up to four times daily as directed. (FOR ICD-10 E10.9, E11.9). 1 each 0   dapagliflozin  propanediol (FARXIGA ) 10 MG TABS tablet  Take 10 mg by mouth daily.      ferrous gluconate  (FERGON) 324 MG tablet Take 324 mg by mouth daily.     FLUoxetine  (PROZAC ) 10 MG capsule Take 10 mg by mouth in the morning.     HYDROcodone -acetaminophen  (NORCO/VICODIN) 5-325 MG tablet Take 1 tablet by mouth every 6 (six) hours as needed for severe pain. 20 tablet 0   hydrOXYzine (VISTARIL) 25 MG capsule Take 25 mg by mouth 3 (three) times daily.     levETIRAcetam  (KEPPRA  XR) 500 MG 24 hr tablet Take 2 tablets (1,000 mg total) by mouth daily. 180 tablet 3   magnesium  oxide (MAG-OX) 400 MG tablet Take 400 mg by mouth daily.     metFORMIN  (GLUCOPHAGE )  500 MG tablet Take 500 mg by mouth 3 (three) times daily.     metoprolol  succinate (TOPROL -XL) 25 MG 24 hr tablet Take 0.5 tablets (12.5 mg total) by mouth daily. 45 tablet 0   pantoprazole  (PROTONIX ) 40 MG tablet Take 1 tablet (40 mg total) by mouth daily. 30 tablet 0   pregabalin  (LYRICA ) 150 MG capsule TAKE 1 CAPSULE BY MOUTH THREE TIMES DAILY 90 capsule 5   ranolazine  (RANEXA ) 500 MG 12 hr tablet Take 1 tablet (500 mg total) by mouth 2 (two) times daily. 180 tablet 0   risperiDONE  (RISPERDAL ) 0.5 MG tablet Take 0.5 mg by mouth at bedtime.     ticagrelor  (BRILINTA ) 90 MG TABS tablet Take 1 tablet (90 mg total) by mouth 2 (two) times daily. 180 tablet 0   tiZANidine  (ZANAFLEX ) 2 MG tablet TAKE 1 TABLET BY MOUTH AT BEDTIME AS NEEDED FOR MUSCLE SPASMS 30 tablet 2   traMADol  (ULTRAM ) 50 MG tablet Take 50 mg by mouth 4 (four) times daily.     traZODone  (DESYREL ) 100 MG tablet Take 200 mg by mouth at bedtime.      diazepam  (VALIUM ) 5 MG tablet Take 5 mg by mouth 2 (two) times daily.  (Patient not taking: Reported on 10/17/2023)     No current facility-administered medications for this encounter.    Allergies  Allergen Reactions   Amitriptyline Other (See Comments)    Unresponsive    Contrast Media [Iodinated Contrast Media] Other (See Comments)    2013-had CVA, went into AFR-combination ABT, contrast, low BP   Morphine  And Codeine Other (See Comments)    Chest pain   Promethazine  Swelling    Lips swell   Compazine Other (See Comments)    -Alert but unable to move   Prochlorperazine Maleate Other (See Comments)    -Alert, but unable to move   Adhesive [Tape] Rash    Long term exposure causes skin to tear    Oxycodone  Nausea And Vomiting    Severe n/v -  patient will not take oxycodone  or percocet.   Sumatriptan Rash      Social History   Socioeconomic History   Marital status: Single    Spouse name: Not on file   Number of children: 0   Years of education: 14   Highest education  level: Not on file  Occupational History   Not on file  Tobacco Use   Smoking status: Former    Current packs/day: 0.00    Average packs/day: 0.6 packs/day for 23.0 years (13.8 ttl pk-yrs)    Types: Cigarettes    Start date: 02/05/1996    Quit date: 02/05/2019    Years since quitting: 4.7    Passive exposure: Never   Smokeless tobacco: Never  Vaping Use   Vaping status: Never Used  Substance and Sexual Activity   Alcohol use: No    Alcohol/week: 0.0 standard drinks of alcohol   Drug use: No   Sexual activity: Not Currently    Birth control/protection: None  Other Topics Concern   Not on file  Social History Narrative   She is single and does not have any children.    She has 2 yrs of college level education.   She has 5-6 caffeine drinks daily.    Left handed      Living with parents.   Social Drivers of Corporate investment banker Strain: Low Risk  (10/25/2023)   Overall Financial Resource Strain (CARDIA)    Difficulty of Paying Living Expenses: Not very hard  Food Insecurity: No Food Insecurity (06/30/2023)   Received from [facility]   Hunger Vital Sign    Worried About Running Out of Food in the Last Year: Never true    Ran Out of Food in the Last Year: Never true  Transportation Needs: No Transportation Needs (10/25/2023)   PRAPARE - Administrator, Civil Service (Medical): No    Lack of Transportation (Non-Medical): No  Physical Activity: Insufficiently Active (06/30/2023)   Received from [facility]   Exercise Vital Sign    Days of Exercise per Week: 2 days    Minutes of Exercise per Session: 30 min  Stress: No Stress Concern Present (06/30/2023)   Received from [facility] - Occupational Stress Questionnaire    Feeling of Stress : Not at all  Social Connections: Patient Unable To Answer (10/18/2023)   Social Connection and Isolation Panel [NHANES]    Frequency of Communication with Friends and  Family: Patient unable to answer    Frequency of Social Gatherings with Friends and Family: Patient unable to answer    Attends Religious Services: Patient unable to answer    Active Member of Clubs or Organizations: Patient unable to answer    Attends Banker Meetings: Patient unable to answer    Marital Status: Patient unable to answer  Intimate Partner Violence: Unknown (09/16/2021)   [facility] from [facility]   HITS    Physically Hurt: Not on file    Insult or Talk Down To: Not on file    Threaten Physical Harm: Not on file    Scream or Curse: Not on file      Family History  Problem Relation Age of Onset   Lung cancer Maternal Grandmother    Heart attack Maternal Grandfather    Diabetes Paternal Grandfather     Vitals:   11/03/23 1134  BP: 108/60  Pulse: 90  SpO2: 96%  Weight: 99.4 kg (219 lb 3.2 oz)    PHYSICAL EXAM: General:  Well appearing. No respiratory difficulty HEENT: normal Neck: supple. no JVD. Carotids 2+ bilat; no bruits. No lymphadenopathy or thryomegaly appreciated. Cor: PMI nondisplaced. Regular rate & rhythm. No rubs, gallops or murmurs. Lungs: clear Abdomen: soft, nontender, nondistended. No hepatosplenomegaly. No bruits or masses. Good bowel sounds. Extremities: no cyanosis, clubbing, rash, edema Neuro: alert & oriented x 3, cranial nerves grossly intact. moves all 4 extremities w/o difficulty. Affect pleasant.  ECG: SR 91 bpm    ASSESSMENT & PLAN: 1. Chronic HFrEF, ICM  Echo EF 35-40%  NYHA II GDMT  Diuretic-Appears euvolemic.  BB- Continue Toprol  XL.  Ace/ARB/ARNI- Hold off with soft BP.  MRA- Consider next visit.  SGLT2i- continue farxiga  10 mg daily  Plan to repeat Echo 3-4 months.   2. CAD , Multivessel Cath 10/2023  - Severe multivessel disease. Not a candidate for CABG. DES to mid and distal circumflex. See cath report.  On high intensity statin + brillinta + aspirin .  Continue farxiga  10 mg daily and  Toprol  Xl 12.5 mg daily  Waiting on referral to cardiac rehab.  Prescribed SL Nitro   Referred to HFSW (PCP, Medications, Transportation, ETOH Abuse, Drug Abuse, Insurance, Financial ):  No Refer to Pharmacy:  No Refer to Home Health:  No Refer to Advanced [facility] Failure Clinic:  no  Refer to General Cardiology: Has follow up in [facility]  Follow up as needed. I spent 45 minutes reviewing records, interviewing/examining patient, and managing orders.     Taylor Olexa NP-C  3:55 PM

## 2023-11-03 NOTE — Patient Instructions (Addendum)
 Thank you for coming in today  If you had labs drawn today, any labs that are abnormal the clinic will call you No news is good news  Medications: Nitrogylcerin Place 1 tablet (0.4 mg total) under the tongue every 5 (five) minutes times 3 as needed for chest pain if still having chest pain after 3rd dose please call 911  Follow up appointments:  Your physician recommends that you schedule a follow-up appointment in:  As needed    Do the following things EVERYDAY: Weigh yourself in the morning before breakfast. Write it down and keep it in a log. Take your medicines as prescribed Eat low salt foods--Limit salt (sodium) to 2000 mg per day.  Stay as active as you can everyday Limit all fluids for the day to less than 2 liters   At the [facility], you and your health needs are our priority. As part of our continuing mission to provide you with exceptional heart care, we have created designated Provider Care Teams. These Care Teams include your primary Cardiologist (physician) and Advanced Practice Providers (APPs- Physician Assistants and Nurse Practitioners) who all work together to provide you with the care you need, when you need it.   You may see any of the following providers on your designated Care Team at your next follow up: Dr Jules Oar Dr Peder Bourdon Dr. Mimi Alt, NP Ruddy Corral, [place] [facility] [place], [place] Dennise Fitz, NP Luster Salters, PharmD   Please be sure to bring in all your medications bottles to every appointment.    Thank you for choosing [facility]  If you have any questions or concerns before your next appointment please send us  a message through [place] or call our office at [phone].    TO LEAVE A MESSAGE FOR THE NURSE SELECT OPTION 2, PLEASE LEAVE A MESSAGE INCLUDING: YOUR NAME DATE OF BIRTH CALL BACK NUMBER REASON FOR CALL**this is important as  we prioritize the call backs  YOU WILL RECEIVE A CALL BACK THE SAME DAY AS LONG AS YOU CALL BEFORE 4:00 PM

## 2023-11-08 ENCOUNTER — Encounter (HOSPITAL_COMMUNITY): Payer: Self-pay

## 2023-11-09 ENCOUNTER — Encounter (HOSPITAL_COMMUNITY)
Admission: RE | Admit: 2023-11-09 | Discharge: 2023-11-09 | Disposition: A | Discharge status: Home / Self Care | Source: Ambulatory Visit | Attending: Cardiology | Admitting: Cardiology

## 2023-11-09 DIAGNOSIS — Z955 Presence of coronary angioplasty implant and graft: Secondary | ICD-10-CM | POA: Insufficient documentation

## 2023-11-09 NOTE — Progress Notes (Signed)
 Virtual orientation visit completed for cardiac rehab with stent placement. On-site orientation visit scheduled for 11/14/23 at 1 pm.

## 2023-11-14 ENCOUNTER — Encounter (HOSPITAL_COMMUNITY)
Admission: RE | Admit: 2023-11-14 | Discharge: 2023-11-14 | Disposition: A | Discharge status: Home / Self Care | Source: Ambulatory Visit | Attending: Cardiology | Admitting: Cardiology

## 2023-11-14 VITALS — Ht 62.5 in | Wt 219.1 lb

## 2023-11-14 DIAGNOSIS — Z955 Presence of coronary angioplasty implant and graft: Secondary | ICD-10-CM | POA: Diagnosis present

## 2023-11-14 NOTE — Progress Notes (Signed)
 Cardiac Individual Treatment Plan  Patient Details  Name: Taylor [name] MRN: [id number] Date of Birth: March 29, 1981 Referring Provider:   Flowsheet Row CARDIAC REHAB PHASE II ORIENTATION from 11/14/2023 in [facility] CARDIAC REHABILITATION  Referring Provider [name] MD       Initial Encounter Date:  Flowsheet Row CARDIAC REHAB PHASE II ORIENTATION from 11/14/2023 in [place] CARDIAC REHABILITATION  Date 11/14/23       Visit Diagnosis: Status post coronary artery stent placement  Patient's Home Medications on Admission:  Current Outpatient Medications:    aspirin  81 MG EC tablet, Take 81 mg by mouth daily. , Disp: , Rfl:    atorvastatin  (LIPITOR ) 80 MG tablet, TAKE 1 TABLET BY MOUTH DAILY, Disp: 30 tablet, Rfl: 0   blood glucose meter kit and supplies, Dispense based on patient and insurance preference. Use up to four times daily as directed. (FOR ICD-10 E10.9, E11.9)., Disp: 1 each, Rfl: 0   dapagliflozin  propanediol (FARXIGA ) 10 MG TABS tablet, Take 10 mg by mouth daily. , Disp: , Rfl:    diazepam  (VALIUM ) 5 MG tablet, Take 5 mg by mouth 2 (two) times daily.  (Patient not taking: Reported on 10/17/2023), Disp: , Rfl:    ferrous gluconate  (FERGON) 324 MG tablet, Take 324 mg by mouth daily., Disp: , Rfl:    FLUoxetine  (PROZAC ) 10 MG capsule, Take 10 mg by mouth in the morning., Disp: , Rfl:    HYDROcodone -acetaminophen  (NORCO/VICODIN) 5-325 MG tablet, Take 1 tablet by mouth every 6 (six) hours as needed for severe pain., Disp: 20 tablet, Rfl: 0   hydrOXYzine (VISTARIL) 25 MG capsule, Take 25 mg by mouth 3 (three) times daily., Disp: , Rfl:    levETIRAcetam  (KEPPRA  XR) 500 MG 24 hr tablet, Take 2 tablets (1,000 mg total) by mouth daily., Disp: 180 tablet, Rfl: 3   magnesium  oxide (MAG-OX) 400 MG tablet, Take 400 mg by mouth daily., Disp: , Rfl:    metFORMIN  (GLUCOPHAGE ) 500 MG tablet, Take 500 mg by mouth 3 (three) times daily., Disp: , Rfl:    metoprolol  succinate (TOPROL -XL) 25  MG 24 hr tablet, Take 0.5 tablets (12.5 mg total) by mouth daily., Disp: 45 tablet, Rfl: 0   nitroGLYCERIN  (NITROSTAT ) 0.4 MG SL tablet, Place 1 tablet (0.4 mg total) under the tongue every 5 (five) minutes as needed for chest pain., Disp: 90 tablet, Rfl: 3   pantoprazole  (PROTONIX ) 40 MG tablet, Take 1 tablet (40 mg total) by mouth daily., Disp: 30 tablet, Rfl: 0   pregabalin  (LYRICA ) 150 MG capsule, TAKE 1 CAPSULE BY MOUTH THREE TIMES DAILY, Disp: 90 capsule, Rfl: 5   ranolazine  (RANEXA ) 500 MG 12 hr tablet, Take 1 tablet (500 mg total) by mouth 2 (two) times daily., Disp: 180 tablet, Rfl: 0   risperiDONE  (RISPERDAL ) 0.5 MG tablet, Take 0.5 mg by mouth at bedtime., Disp: , Rfl:    ticagrelor  (BRILINTA ) 90 MG TABS tablet, Take 1 tablet (90 mg total) by mouth 2 (two) times daily., Disp: 180 tablet, Rfl: 0   tiZANidine  (ZANAFLEX ) 2 MG tablet, TAKE 1 TABLET BY MOUTH AT BEDTIME AS NEEDED FOR MUSCLE SPASMS, Disp: 30 tablet, Rfl: 2   traMADol  (ULTRAM ) 50 MG tablet, Take 50 mg by mouth 4 (four) times daily., Disp: , Rfl:    traZODone  (DESYREL ) 100 MG tablet, Take 200 mg by mouth at bedtime. 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   due to elevated blood sugar 1700-off insulin  6 months   Depression    Diabetes mellitus    off insulin  6 months   Diverticulitis    Dyspnea    when walking   GERD (gastroesophageal reflux disease)    Hypertension    Kidney dialysis status    on dialysis(stroke) off after 4-6 weeks   Pneumonia 2013, 06/2018   Respiratory failure (HCC) 04/2012   with stroke - in hosp [facility] 12 weeks   Seizures (HCC)    during elvated blood sugsr episode 11/13   Sepsis (HCC)    Stroke (HCC)    11/2018, 12/2018 Weak left side, speech- slurred, Short term memeroy, Gait unsteady    Vocal cord dysfunction      Tobacco Use: Social History   Tobacco Use  Smoking Status Former   Current packs/day: 0.00   Average packs/day: 0.6 packs/day for 23.0 years (13.8 ttl pk-yrs)   Types: Cigarettes   Start date: 02/05/1996   Quit date: 02/05/2019   Years since quitting: 4.7   Passive exposure: Never  Smokeless Tobacco Never    Labs: Review Flowsheet  More data exists      Latest Ref Rng & Units 02/01/2022 02/04/2022 02/05/2022 10/17/2023 10/18/2023  Labs for ITP Cardiac and Pulmonary Rehab  Cholestrol 0 - 200 mg/dL - - 332  - 951   LDL (calc) 0 - 99 mg/dL - - 28  - 47   HDL-C >88 mg/dL - - 39  - 59   Trlycerides <150 mg/dL - - 416  - 606   Hemoglobin A1c 4.8 - 5.6 % - 6.9  - 8.0  -  TCO2 22 - 32 mmol/L 26  - - - -    Capillary Blood Glucose: Lab Results  Component Value Date   GLUCAP 139 (H) 10/26/2023   GLUCAP 187 (H) 10/26/2023   GLUCAP 249 (H) 10/25/2023   GLUCAP 174 (H) 10/25/2023   GLUCAP 164 (H) 10/25/2023     Exercise Target Goals: Exercise Program Goal: Individual exercise prescription set using results from initial 6 min walk test and THRR while considering  patient's activity barriers and safety.   Exercise Prescription Goal: Starting with aerobic activity 30 plus minutes a day, 3 days per week for initial exercise prescription. Provide home exercise prescription and guidelines that participant acknowledges understanding prior to discharge.  Activity Barriers & Risk Stratification:  Activity Barriers & Cardiac Risk Stratification - 11/14/23 1305       Activity Barriers & Cardiac Risk Stratification   Activity Barriers Deconditioning;Balance Concerns;Back Problems;Shortness of Breath;Chest Pain/Angina;Decreased Ventricular Function;Other (comment);Muscular Weakness    Comments Hx three strokes with residuals (limp), not candidate for bypass, no chest pain    Cardiac Risk Stratification High             6 Minute Walk:  6 Minute Walk     Row Name 11/14/23 1433          6 Minute Walk   Phase Initial     Distance 600 feet     Walk Time 3.75 minutes     # of Rest Breaks 1  1:10 seat rest, stopped at 4:55     MPH 1.82     METS 2.83     RPE 17     Perceived Dyspnea  3     VO2 Peak  9.92     Symptoms Yes (comment)     Comments SOB, legs tingling, chest pain 6/10     Resting HR 94 bpm     Resting BP 124/62     Resting Oxygen Saturation  97 %     Exercise Oxygen Saturation  during 6 min walk 97 %     Max Ex. HR 129 bpm     Max Ex. BP 146/72     2 Minute Post BP 124/66              Oxygen Initial Assessment:   Oxygen Re-Evaluation:   Oxygen Discharge (Final Oxygen Re-Evaluation):   Initial Exercise Prescription:  Initial Exercise Prescription - 11/14/23 1400       Date of Initial Exercise RX and Referring Provider   Date 11/14/23    Referring Provider [name] MD    Expected Discharge Date 02/02/24   Medicaid     Oxygen   Maintain Oxygen Saturation 88% or higher      NuStep   Level 1    SPM 80    Minutes 15    METs 2      REL-XR   Level 1    Speed 50    Minutes 15    METs 2      Prescription Details   Frequency (times per week) 3    Duration Progress to 30 minutes of continuous aerobic without signs/symptoms of physical distress      Intensity   THRR 40-80% of Max Heartrate 127-160    Ratings of Perceived Exertion 11-13    Perceived Dyspnea 0-4      Progression   Progression Continue to progress workloads to maintain intensity without signs/symptoms of physical distress.      Resistance Training   Training Prescription Yes    Weight 3 lb    Reps 10-15             Perform Capillary Blood Glucose checks as needed.  Exercise Prescription Changes:   Exercise Prescription Changes     Row Name 11/14/23 1400             Response to Exercise   Blood Pressure (Admit) 124/62       Blood Pressure (Exercise) 146/72       Blood Pressure (Exit) 124/66       Heart Rate (Admit) 94 bpm       Heart  Rate (Exercise) 129 bpm       Heart Rate (Exit) 97 bpm       Oxygen Saturation (Admit) 97 %       Oxygen Saturation (Exercise) 97 %       Oxygen Saturation (Exit) 96 %       Rating of Perceived Exertion (Exercise) 17       Perceived Dyspnea (Exercise) 3       Symptoms SOB, legs tingling, chest pain (6/10)       Comments walk test results                Exercise Comments:   Exercise Goals and Review:   Exercise Goals     Row Name 11/14/23 1436             Exercise Goals   Increase Physical Activity Yes       Intervention Provide advice, education, support and counseling about physical activity/exercise needs.;Develop an individualized exercise prescription for aerobic and resistive training based on initial evaluation findings, risk  stratification, comorbidities and participant's personal goals.       Expected Outcomes Short Term: Attend rehab on a regular basis to increase amount of physical activity.;Long Term: Add in home exercise to make exercise part of routine and to increase amount of physical activity.;Long Term: Exercising regularly at least 3-5 days a week.       Increase Strength and Stamina Yes       Intervention Provide advice, education, support and counseling about physical activity/exercise needs.;Develop an individualized exercise prescription for aerobic and resistive training based on initial evaluation findings, risk stratification, comorbidities and participant's personal goals.       Expected Outcomes Short Term: Increase workloads from initial exercise prescription for resistance, speed, and METs.;Short Term: Perform resistance training exercises routinely during rehab and add in resistance training at home;Long Term: Improve cardiorespiratory fitness, muscular endurance and strength as measured by increased METs and functional capacity ( )       Able to understand and use rate of perceived exertion (RPE) scale Yes       Intervention Provide education and  explanation on how to use RPE scale       Expected Outcomes Short Term: Able to use RPE daily in rehab to express subjective intensity level;Long Term:  Able to use RPE to guide intensity level when exercising independently       Able to understand and use Dyspnea scale Yes       Intervention Provide education and explanation on how to use Dyspnea scale       Expected Outcomes Long Term: Able to use Dyspnea scale to guide intensity level when exercising independently;Short Term: Able to use Dyspnea scale daily in rehab to express subjective sense of shortness of breath during exertion       Knowledge and understanding of Target Heart Rate Range (THRR) Yes       Intervention Provide education and explanation of THRR including how the numbers were predicted and where they are located for reference       Expected Outcomes Short Term: Able to state/look up THRR;Long Term: Able to use THRR to govern intensity when exercising independently;Short Term: Able to use daily as guideline for intensity in rehab       Able to check pulse independently Yes       Intervention Provide education and demonstration on how to check pulse in carotid and radial arteries.;Review the importance of being able to check your own pulse for safety during independent exercise       Expected Outcomes Short Term: Able to explain why pulse checking is important during independent exercise;Long Term: Able to check pulse independently and accurately       Understanding of Exercise Prescription Yes       Intervention Provide education, explanation, and written materials on patient's individual exercise prescription       Expected Outcomes Short Term: Able to explain program exercise prescription;Long Term: Able to explain home exercise prescription to exercise independently                Exercise Goals Re-Evaluation :    Discharge Exercise Prescription (Final Exercise Prescription Changes):  Exercise Prescription Changes -  11/14/23 1400       Response to Exercise   Blood Pressure (Admit) 124/62    Blood Pressure (Exercise) 146/72    Blood Pressure (Exit) 124/66    Heart Rate (Admit) 94 bpm    Heart Rate (Exercise) 129 bpm    Heart Rate (Exit) 97  bpm    Oxygen Saturation (Admit) 97 %    Oxygen Saturation (Exercise) 97 %    Oxygen Saturation (Exit) 96 %    Rating of Perceived Exertion (Exercise) 17    Perceived Dyspnea (Exercise) 3    Symptoms SOB, legs tingling, chest pain (6/10)    Comments walk test results             Nutrition:  Target Goals: Understanding of nutrition guidelines, daily intake of sodium 1500mg , cholesterol 200mg , calories 30% from fat and 7% or less from saturated fats, daily to have 5 or more servings of fruits and vegetables.  Biometrics:  Pre Biometrics - 11/14/23 1437       Pre Biometrics   Height 5' 2.5" (1.588 m)    Weight 99.4 kg    Waist Circumference 47 inches    Hip Circumference 44 inches    Waist to Hip Ratio 1.07 %    BMI (Calculated) 39.41    Grip Strength 26.5 kg    Single Leg Stand 3.4 seconds              Nutrition Therapy Plan and Nutrition Goals:  Nutrition Therapy & Goals - 11/14/23 1437       Intervention Plan   Intervention Prescribe, educate and counsel regarding individualized specific dietary modifications aiming towards targeted core components such as weight, hypertension, lipid management, diabetes, heart failure and other comorbidities.;Nutrition handout(s) given to patient.    Expected Outcomes Short Term Goal: Understand basic principles of dietary content, such as calories, fat, sodium, cholesterol and nutrients.;Long Term Goal: Adherence to prescribed nutrition plan.             Nutrition Assessments:  MEDIFICTS Score Key: >=70 Need to make dietary changes  40-70 Heart Healthy Diet <= 40 Therapeutic Level Cholesterol Diet  Flowsheet Row CARDIAC REHAB PHASE II ORIENTATION from 11/14/2023 in [facility] CARDIAC  REHABILITATION  Picture Your Plate Total Score on Admission 54      Picture Your Plate Scores: <16 Unhealthy dietary pattern with much room for improvement. 41-50 Dietary pattern unlikely to meet recommendations for good health and room for improvement. 51-60 More healthful dietary pattern, with some room for improvement.  >60 Healthy dietary pattern, although there may be some specific behaviors that could be improved.    Nutrition Goals Re-Evaluation:   Nutrition Goals Discharge (Final Nutrition Goals Re-Evaluation):   Psychosocial: Target Goals: Acknowledge presence or absence of significant depression and/or stress, maximize coping skills, provide positive support system. Participant is able to verbalize types and ability to use techniques and skills needed for reducing stress and depression.  Initial Review & Psychosocial Screening:  Initial Psych Review & Screening - 11/09/23 1004       Initial Review   Current issues with History of Depression;Current Anxiety/Panic;Current Psychotropic Meds;Current Depression      Family Dynamics   Good Support System? Yes      Barriers   Psychosocial barriers to participate in program The patient should benefit from training in stress management and relaxation.;There are no identifiable barriers or psychosocial needs.      Screening Interventions   Interventions Encouraged to exercise;To provide support and resources with identified psychosocial needs;Provide feedback about the scores to participant    Expected Outcomes Short Term goal: Utilizing psychosocial counselor, staff and physician to assist with identification of specific Stressors or current issues interfering with healing process. Setting desired goal for each stressor or current issue identified.;Long Term Goal: Stressors or current  issues are controlled or eliminated.;Short Term goal: Identification and review with participant of any Quality of Life or Depression concerns  found by scoring the questionnaire.;Long Term goal: The participant improves quality of Life and PHQ9 Scores as seen by post scores and/or verbalization of changes             Quality of Life Scores:  Scores of 19 and below usually indicate a poorer quality of life in these areas.  A difference of  2-3 points is a clinically meaningful difference.  A difference of 2-3 points in the total score of the Quality of Life Index has been associated with significant improvement in overall quality of life, self-image, physical symptoms, and general health in studies assessing change in quality of life.  PHQ-9: Review Flowsheet       11/14/2023  Depression screen PHQ 2/9  Decreased Interest 1  Down, Depressed, Hopeless 1  PHQ - 2 Score 2  Altered sleeping 1  Tired, decreased energy 1  Change in appetite 1  Feeling bad or failure about yourself  1  Trouble concentrating 0  Moving slowly or fidgety/restless 1  Suicidal thoughts 0  PHQ-9 Score 7  Difficult doing work/chores Somewhat difficult   Interpretation of Total Score  Total Score Depression Severity:  1-4 = Minimal depression, 5-9 = Mild depression, 10-14 = Moderate depression, 15-19 = Moderately severe depression, 20-27 = Severe depression   Psychosocial Evaluation and Intervention:  Psychosocial Evaluation - 11/09/23 1006       Psychosocial Evaluation & Interventions   Interventions Stress management education;Relaxation education;Encouraged to exercise with the program and follow exercise prescription    Comments Patient was referred to CR with Stent placement. Her mother answered all questions. Patient has a long history of depression and anxiety and seizure disorder. She lives with her parents who support her along with her brother. Her mother is her legal guardian. She is seeing a psychiatrist routinely but her mother says they are currently looking for another therapist. She is on diazepam , prozac , risperdal  and Trazodone  for  management of her psychosocial issure. She sleeps well at night. Her mother says her depression has worsened since Januray 2025 when she developed chronic back pain. She has been evaulated but no cause has been found. She says she is not motivated to do a lot of activities due to the pain. 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 Her goals for the program are to lose weight, improve her SOB and get more active. Her mother has done cardiac rehab here and she is very motivated for her daughter to participate; however, her chronic pain and depression could be a barrier for her to particiapte consistently.    Expected Outcomes Short Term: Patient will start the program and attend consistently. Long Term: Patient will complete the program meeting personal goals.    Continue Psychosocial Services  Follow up required by staff             Psychosocial Re-Evaluation:   Psychosocial Discharge (Final Psychosocial Re-Evaluation):   Vocational Rehabilitation: Provide vocational rehab assistance to qualifying candidates.   Vocational Rehab Evaluation & Intervention:  Vocational Rehab - 11/09/23 1003       Initial Vocational Rehab Evaluation & Intervention   Assessment shows need for Vocational Rehabilitation No      Vocational Rehab Re-Evaulation   Comments Patient is disabled.             Education:  Education Goals: Education classes will be provided on a weekly basis, covering required topics. Participant will state understanding/return demonstration of topics presented.  Learning Barriers/Preferences:  Learning Barriers/Preferences - 11/09/23 1003       Learning Barriers/Preferences   Learning Barriers None    Learning Preferences Written Material;Audio;Skilled Demonstration             Education Topics: Hypertension, Hypertension Reduction -Define heart disease and high blood pressure. Discus how high blood pressure affects the body  and ways to reduce high blood pressure.   Exercise and Your Heart -Discuss why it is important to exercise, the FITT principles of exercise, normal and abnormal responses to exercise, and how to exercise safely.   Angina -Discuss definition of angina, causes of angina, treatment of angina, and how to decrease risk of having angina.   Cardiac Medications -Review what the following cardiac medications are used for, how they affect the body, and side effects that may occur when taking the medications.  Medications include Aspirin , Beta blockers, calcium  channel blockers, ACE Inhibitors, angiotensin receptor blockers, diuretics, digoxin, and antihyperlipidemics.   Congestive Heart Failure -Discuss the definition of CHF, how to live with CHF, the signs and symptoms of CHF, and how keep track of weight and sodium intake.   Heart Disease and Intimacy -Discus the effect sexual activity has on the heart, how changes occur during intimacy as we age, and safety during sexual activity.   Smoking Cessation / COPD -Discuss different methods to quit smoking, the health benefits of quitting smoking, and the definition of COPD.   Nutrition I: Fats -Discuss the types of cholesterol, what cholesterol does to the heart, and how cholesterol levels can be controlled.   Nutrition II: Labels -Discuss the different components of food labels and how to read food label   Heart Parts/Heart Disease and PAD -Discuss the anatomy of the heart, the pathway of blood circulation through the heart, and these are affected by heart disease.   Stress I: Signs and Symptoms -Discuss the causes of stress, how stress may lead to anxiety and depression, and ways to limit stress.   Stress II: Relaxation -Discuss different types of relaxation techniques to limit stress.   Warning Signs of Stroke / TIA -Discuss definition of a stroke, what the signs and symptoms are of a stroke, and how to identify when someone is  having stroke.   Knowledge Questionnaire Score:   Core Components/Risk Factors/Patient Goals at Admission:  Personal Goals and Risk Factors at Admission - 11/14/23 1438       Core Components/Risk Factors/Patient Goals on Admission    Weight Management Yes;Obesity;Weight Loss    Intervention Weight Management: Develop a combined nutrition and exercise program designed to reach desired caloric intake, while maintaining appropriate intake of nutrient and fiber, sodium and fats, and appropriate energy expenditure required for the weight goal.;Weight Management: Provide education and appropriate resources to help participant work on and attain dietary goals.;Weight Management/Obesity: Establish reasonable short term and long term weight goals.;Obesity: Provide education and appropriate resources to help participant work on and attain dietary goals.    Admit Weight 219 lb 1.6 oz (99.4 kg)    Goal Weight: Short Term 214 lb (97.1 kg)    Goal Weight: Long Term 209 lb (94.8 kg)    Expected Outcomes Short Term: Continue to assess and modify interventions until short term weight is achieved;Long Term: Adherence to nutrition and physical activity/exercise program aimed toward attainment of established weight goal;Weight Loss: Understanding of  general recommendations for a balanced deficit meal plan, which promotes 1-2 lb weight loss per week and includes a negative energy balance of [phone] kcal/d;Understanding recommendations for meals to include 15-35% energy as protein, 25-35% energy from fat, 35-60% energy from carbohydrates, less than 200mg  of dietary cholesterol, 20-35 gm of total fiber daily;Understanding of distribution of calorie intake throughout the day with the consumption of 4-5 meals/snacks    Improve shortness of breath with ADL's Yes    Intervention Provide education, individualized exercise plan and daily activity instruction to help decrease symptoms of SOB with activities of daily living.     Expected Outcomes Short Term: Improve cardiorespiratory fitness to achieve a reduction of symptoms when performing ADLs;Long Term: Be able to perform more ADLs without symptoms or delay the onset of symptoms    Intervention Provide education about signs/symptoms and action to take for hypo/hyperglycemia.;Provide education about proper nutrition, including hydration, and aerobic/resistive exercise prescription along with prescribed medications to achieve blood glucose in normal ranges: Fasting glucose 65-99 mg/dL    Expected Outcomes Short Term: Participant verbalizes understanding of the signs/symptoms and immediate care of hyper/hypoglycemia, proper foot care and importance of medication, aerobic/resistive exercise and nutrition plan for blood glucose control.;Long Term: Attainment of HbA1C < 7%.    Heart Failure Yes    Intervention Provide a combined exercise and nutrition program that is supplemented with education, support and counseling about heart failure. Directed toward relieving symptoms such as shortness of breath, decreased exercise tolerance, and extremity edema.    Expected Outcomes Improve functional capacity of life;Short term: Attendance in program 2-3 days a week with increased exercise capacity. Reported lower sodium intake. Reported increased fruit and vegetable intake. Reports medication compliance.;Short term: Daily weights obtained and reported for increase. Utilizing diuretic protocols set by physician.;Long term: Adoption of self-care skills and reduction of barriers for early signs and symptoms recognition and intervention leading to self-care maintenance.    Hypertension Yes    Intervention Provide education on lifestyle modifcations including regular physical activity/exercise, weight management, moderate sodium restriction and increased consumption of fresh fruit, vegetables, and low fat dairy, alcohol moderation, and smoking cessation.;Monitor prescription use compliance.     Expected Outcomes Short Term: Continued assessment and intervention until BP is < 140/85mm HG in hypertensive participants. < 130/25mm HG in hypertensive participants with diabetes, heart failure or chronic kidney disease.;Long Term: Maintenance of blood pressure at goal levels.    Lipids Yes    Intervention Provide education and support for participant on nutrition & aerobic/resistive exercise along with prescribed medications to achieve LDL 70mg , HDL >40mg .    Expected Outcomes Short Term: Participant states understanding of desired cholesterol values and is compliant with medications prescribed. Participant is following exercise prescription and nutrition guidelines.;Long Term: Cholesterol controlled with medications as prescribed, with individualized exercise RX and with personalized nutrition plan. Value goals: LDL < 70mg , HDL > 40 mg.             Core Components/Risk Factors/Patient Goals Review:    Core Components/Risk Factors/Patient Goals at Discharge (Final Review):    ITP Comments:  ITP Comments     Row Name 11/09/23 1033 11/14/23 1432         ITP Comments Virtual orientation visit completed for cardiac rehab with stent placement. On-site orientation visit scheduled for 11/14/23 at 1 pm. Patient attend orientation today.  Patient is attending Cardiac Rehabilitation Program.  Documentation for diagnosis can be found in [facility] 10/17/23 and intervention 10/24/23.  Reviewed medical chart,  RPE/RPD, gym safety, and program guidelines.  Patient was fitted to equipment they will be using during rehab.  Patient is scheduled to start exercise on Friday 11/16/23 at 1100.   Initial ITP created and sent for review and signature by Dr. Armida Lander, Medical Director for Cardiac Rehabilitation Program.               Comments: Initial ITP

## 2023-11-14 NOTE — Patient Instructions (Signed)
 Patient Instructions  Patient Details  Name: Taylor [name] MRN: [id number] Date of Birth: 07-28-1980 Referring Provider:  [name], MD  Below are your personal goals for exercise, nutrition, and risk factors. Our goal is to help you stay on track towards obtaining and maintaining these goals. We will be discussing your progress on these goals with you throughout the program.  Initial Exercise Prescription:  Initial Exercise Prescription - 11/14/23 1400       Date of Initial Exercise RX and Referring Provider   Date 11/14/23    Referring Provider [name] MD    Expected Discharge Date 02/02/24   Medicaid     Oxygen   Maintain Oxygen Saturation 88% or higher      NuStep   Level 1    SPM 80    Minutes 15    METs 2      REL-XR   Level 1    Speed 50    Minutes 15    METs 2      Prescription Details   Frequency (times per week) 3    Duration Progress to 30 minutes of continuous aerobic without signs/symptoms of physical distress      Intensity   THRR 40-80% of Max Heartrate 127-160    Ratings of Perceived Exertion 11-13    Perceived Dyspnea 0-4      Progression   Progression Continue to progress workloads to maintain intensity without signs/symptoms of physical distress.      Resistance Training   Training Prescription Yes    Weight 3 lb    Reps 10-15             Exercise Goals: Frequency: Be able to perform aerobic exercise two to three times per week in program working toward 2-5 days per week of home exercise.  Intensity: Work with a perceived exertion of 11 (fairly light) - 15 (hard) while following your exercise prescription.  We will make changes to your prescription with you as you progress through the program.   Duration: Be able to do 30 to 45 minutes of continuous aerobic exercise in addition to a 5 minute warm-up and a 5 minute cool-down routine.   Nutrition Goals: Your personal nutrition goals will be established when you do your  nutrition analysis with the dietician.  The following are general nutrition guidelines to follow: Cholesterol < 200mg /day Sodium < 1500mg /day Fiber: Women under 50 yrs - 25 grams per day  Personal Goals:  Personal Goals and Risk Factors at Admission - 11/14/23 1438       Core Components/Risk Factors/Patient Goals on Admission    Weight Management Yes;Obesity;Weight Loss    Intervention Weight Management: Develop a combined nutrition and exercise program designed to reach desired caloric intake, while maintaining appropriate intake of nutrient and fiber, sodium and fats, and appropriate energy expenditure required for the weight goal.;Weight Management: Provide education and appropriate resources to help participant work on and attain dietary goals.;Weight Management/Obesity: Establish reasonable short term and long term weight goals.;Obesity: Provide education and appropriate resources to help participant work on and attain dietary goals.    Admit Weight 219 lb 1.6 oz (99.4 kg)    Goal Weight: Short Term 214 lb (97.1 kg)    Goal Weight: Long Term 209 lb (94.8 kg)    Expected Outcomes Short Term: Continue to assess and modify interventions until short term weight is achieved;Long Term: Adherence to nutrition and physical activity/exercise program aimed toward attainment of established weight goal;Weight  Loss: Understanding of general recommendations for a balanced deficit meal plan, which promotes 1-2 lb weight loss per week and includes a negative energy balance of [phone] kcal/d;Understanding recommendations for meals to include 15-35% energy as protein, 25-35% energy from fat, 35-60% energy from carbohydrates, less than 200mg  of dietary cholesterol, 20-35 gm of total fiber daily;Understanding of distribution of calorie intake throughout the day with the consumption of 4-5 meals/snacks    Improve shortness of breath with ADL's Yes    Intervention Provide education, individualized exercise plan  and daily activity instruction to help decrease symptoms of SOB with activities of daily living.    Expected Outcomes Short Term: Improve cardiorespiratory fitness to achieve a reduction of symptoms when performing ADLs;Long Term: Be able to perform more ADLs without symptoms or delay the onset of symptoms    Intervention Provide education about signs/symptoms and action to take for hypo/hyperglycemia.;Provide education about proper nutrition, including hydration, and aerobic/resistive exercise prescription along with prescribed medications to achieve blood glucose in normal ranges: Fasting glucose 65-99 mg/dL    Expected Outcomes Short Term: Participant verbalizes understanding of the signs/symptoms and immediate care of hyper/hypoglycemia, proper foot care and importance of medication, aerobic/resistive exercise and nutrition plan for blood glucose control.;Long Term: Attainment of HbA1C < 7%.    Heart Failure Yes    Intervention Provide a combined exercise and nutrition program that is supplemented with education, support and counseling about heart failure. Directed toward relieving symptoms such as shortness of breath, decreased exercise tolerance, and extremity edema.    Expected Outcomes Improve functional capacity of life;Short term: Attendance in program 2-3 days a week with increased exercise capacity. Reported lower sodium intake. Reported increased fruit and vegetable intake. Reports medication compliance.;Short term: Daily weights obtained and reported for increase. Utilizing diuretic protocols set by physician.;Long term: Adoption of self-care skills and reduction of barriers for early signs and symptoms recognition and intervention leading to self-care maintenance.    Hypertension Yes    Intervention Provide education on lifestyle modifcations including regular physical activity/exercise, weight management, moderate sodium restriction and increased consumption of fresh fruit, vegetables, and  low fat dairy, alcohol moderation, and smoking cessation.;Monitor prescription use compliance.    Expected Outcomes Short Term: Continued assessment and intervention until BP is < 140/22mm HG in hypertensive participants. < 130/69mm HG in hypertensive participants with diabetes, heart failure or chronic kidney disease.;Long Term: Maintenance of blood pressure at goal levels.    Lipids Yes    Intervention Provide education and support for participant on nutrition & aerobic/resistive exercise along with prescribed medications to achieve LDL 70mg , HDL >40mg .    Expected Outcomes Short Term: Participant states understanding of desired cholesterol values and is compliant with medications prescribed. Participant is following exercise prescription and nutrition guidelines.;Long Term: Cholesterol controlled with medications as prescribed, with individualized exercise RX and with personalized nutrition plan. Value goals: LDL < 70mg , HDL > 40 mg.             Tobacco Use Initial Evaluation: Social History   Tobacco Use  Smoking Status Former   Current packs/day: 0.00   Average packs/day: 0.6 packs/day for 23.0 years (13.8 ttl pk-yrs)   Types: Cigarettes   Start date: 02/05/1996   Quit date: 02/05/2019   Years since quitting: 4.7   Passive exposure: Never  Smokeless Tobacco Never    Exercise Goals and Review:  Exercise Goals     Row Name 11/14/23 1436  Exercise Goals   Increase Physical Activity Yes       Intervention Provide advice, education, support and counseling about physical activity/exercise needs.;Develop an individualized exercise prescription for aerobic and resistive training based on initial evaluation findings, risk stratification, comorbidities and participant's personal goals.       Expected Outcomes Short Term: Attend rehab on a regular basis to increase amount of physical activity.;Long Term: Add in home exercise to make exercise part of routine and to increase  amount of physical activity.;Long Term: Exercising regularly at least 3-5 days a week.       Increase Strength and Stamina Yes       Intervention Provide advice, education, support and counseling about physical activity/exercise needs.;Develop an individualized exercise prescription for aerobic and resistive training based on initial evaluation findings, risk stratification, comorbidities and participant's personal goals.       Expected Outcomes Short Term: Increase workloads from initial exercise prescription for resistance, speed, and METs.;Short Term: Perform resistance training exercises routinely during rehab and add in resistance training at home;Long Term: Improve cardiorespiratory fitness, muscular endurance and strength as measured by increased METs and functional capacity ( )       Able to understand and use rate of perceived exertion (RPE) scale Yes       Intervention Provide education and explanation on how to use RPE scale       Expected Outcomes Short Term: Able to use RPE daily in rehab to express subjective intensity level;Long Term:  Able to use RPE to guide intensity level when exercising independently       Able to understand and use Dyspnea scale Yes       Intervention Provide education and explanation on how to use Dyspnea scale       Expected Outcomes Long Term: Able to use Dyspnea scale to guide intensity level when exercising independently;Short Term: Able to use Dyspnea scale daily in rehab to express subjective sense of shortness of breath during exertion       Knowledge and understanding of Target Heart Rate Range (THRR) Yes       Intervention Provide education and explanation of THRR including how the numbers were predicted and where they are located for reference       Expected Outcomes Short Term: Able to state/look up THRR;Long Term: Able to use THRR to govern intensity when exercising independently;Short Term: Able to use daily as guideline for intensity in rehab        Able to check pulse independently Yes       Intervention Provide education and demonstration on how to check pulse in carotid and radial arteries.;Review the importance of being able to check your own pulse for safety during independent exercise       Expected Outcomes Short Term: Able to explain why pulse checking is important during independent exercise;Long Term: Able to check pulse independently and accurately       Understanding of Exercise Prescription Yes       Intervention Provide education, explanation, and written materials on patient's individual exercise prescription       Expected Outcomes Short Term: Able to explain program exercise prescription;Long Term: Able to explain home exercise prescription to exercise independently              Copy of goals given to participant.

## 2023-11-17 ENCOUNTER — Encounter (HOSPITAL_COMMUNITY)

## 2023-11-20 ENCOUNTER — Encounter (HOSPITAL_COMMUNITY)
Admission: RE | Admit: 2023-11-20 | Discharge: 2023-11-20 | Disposition: A | Discharge status: Home / Self Care | Source: Ambulatory Visit | Attending: Cardiology | Admitting: Cardiology

## 2023-11-20 DIAGNOSIS — Z955 Presence of coronary angioplasty implant and graft: Secondary | ICD-10-CM

## 2023-11-20 LAB — GLUCOSE, CAPILLARY
Glucose-Capillary: 176 mg/dL — ABNORMAL HIGH (ref 70–99)
Glucose-Capillary: 154 mg/dL — ABNORMAL HIGH (ref 70–99)

## 2023-11-20 NOTE — Progress Notes (Addendum)
 Daily Session Note  Patient Details  Name: Taylor [name] MRN: [id number] Date of Birth: 01/22/1981 Referring Provider:   Flowsheet Row CARDIAC REHAB PHASE II ORIENTATION from 11/14/2023 in [facility] C[facility]  Referring Provider [name] MD       Encounter Date: 11/20/2023  Check In:  Session Check In - 11/20/23 1058       Check-In   Supervising physician immediately available to respond to emergencies See telemetry face sheet for immediately available MD    Location [facility]    Staff Present Rita Cherry, MA, RCEP, CCRP, CCET;Brittany Annette Barters, BSN, RN, WTA-C    Virtual Visit No    Medication changes reported     No    Fall or balance concerns reported    No    Warm-up and Cool-down Performed on first and last piece of equipment    Resistance Training Performed Yes    VAD Patient? No    PAD/SET Patient? No      Pain Assessment   Currently in Pain? No/denies             Capillary Blood Glucose: Results for orders placed or performed during the hospital encounter of 11/20/23 (from the past 24 hours)  Glucose, capillary     Status: Abnormal   Collection Time: 11/20/23 10:53 AM  Result Value Ref Range   Glucose-Capillary 176 (H) 70 - 99 mg/dL      Social History   Tobacco Use  Smoking Status Former   Current packs/day: 0.00   Average packs/day: 0.6 packs/day for 23.0 years (13.8 ttl pk-yrs)   Types: Cigarettes   Start date: 02/05/1996   Quit date: 02/05/2019   Years since quitting: 4.7   Passive exposure: Never  Smokeless Tobacco Never    Goals Met:  Proper associated with RPD/PD & O2 Sat Independence with exercise equipment Exercise tolerated well Personal goals reviewed No report of concerns or symptoms today Strength training completed today  Goals Unmet:  Not Applicable  Comments: First full day of exercise!  Patient was oriented to gym and equipment including functions, settings, policies, and procedures.   Patient's individual exercise prescription and treatment plan were reviewed.  All starting workloads were established based on the results of the 6 minute walk test done at initial orientation visit.  The plan for exercise progression was also introduced and progression will be customized based on patient's performance and goals.

## 2023-11-20 NOTE — Progress Notes (Deleted)
 Cardiology Clinic Note   Patient Name: Taylor [name] Date of Encounter: 11/20/2023  Primary Care Provider:  Theoplis Fix, MD Primary Cardiologist:  [name], MD  Patient Profile    Taylor [name] 43 year old female presents the clinic today for follow-up evaluation of her coronary artery disease and systolic CHF.  Past Medical History    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   due to elevated blood sugar 1700-off insulin  6 months   Depression    Diabetes mellitus    off insulin  6 months   Diverticulitis    Dyspnea    when walking   GERD (gastroesophageal reflux disease)    Hypertension    Kidney dialysis status    on dialysis(stroke) off after 4-6 weeks   Pneumonia 2013, 06/2018   Respiratory failure (HCC) 04/2012   with stroke - in hosp [facility] 12 weeks   Seizures (HCC)    during elvated blood sugsr episode 11/13   Sepsis (HCC)    Stroke (HCC)    11/2018, 12/2018 Weak left side, speech- slurred, Short term memeroy, Gait unsteady    Vocal cord dysfunction    Past Surgical History:  Procedure Laterality Date   ABDOMINAL AORTOGRAM W/LOWER EXTREMITY N/A 06/25/2019   Procedure: ABDOMINAL AORTOGRAM W/LOWER EXTREMITY;  Surgeon: Margherita Shell, MD;  [facility]: [facility];  Service: Cardiovascular;  Laterality: N/A;   ABDOMINAL AORTOGRAM W/LOWER EXTREMITY N/A 10/22/2019   Procedure: ABDOMINAL AORTOGRAM W/LOWER EXTREMITY;  Surgeon: Margherita Shell, MD;  Location: [facility];  Service: Cardiovascular;  Laterality: N/A;   ABDOMINAL AORTOGRAM W/LOWER EXTREMITY Left 04/01/2020   Procedure: ABDOMINAL AORTOGRAM W/LOWER EXTREMITY;  Surgeon: Young Hensen, MD;  [facility]: [facility];  Service: Cardiovascular;  Laterality: Left;   ABDOMINAL AORTOGRAM W/LOWER EXTREMITY N/A 02/01/2022   Procedure: ABDOMINAL  AORTOGRAM W/LOWER EXTREMITY;  Surgeon: Margherita Shell, MD;  [facility];  Service: Cardiovascular;  Laterality: N/A;   AMPUTATION Left 07/03/2019   Procedure: LEFT FOOT 4TH AND 5TH RAY AMPUTATION;  Surgeon: Timothy Ford, MD;  Location: [facility] OR;  Service: Orthopedics;  Laterality: Left;   APPENDECTOMY     CORONARY STENT INTERVENTION N/A 10/24/2023   Procedure: CORONARY STENT INTERVENTION;  Surgeon: Arleen Lacer, MD;  Location: [facility];  Service: Cardiovascular;  Laterality: N/A;   FEMORAL-POPLITEAL BYPASS GRAFT Left 02/04/2022   Procedure: LEFT FEMORAL-POPLITEAL ARTERY BYPASS;  Surgeon: Margherita Shell, MD;  [facility]: [facility];  Service: Vascular;  Laterality: Left;   INSERTION OF DIALYSIS CATHETER  11/13   removed in 4-6 weeks   IR ANGIO INTRA EXTRACRAN SEL COM CAROTID INNOMINATE BILAT MOD SED  02/07/2019   IR ANGIO VERTEBRAL SEL VERTEBRAL BILAT MOD SED  02/07/2019   IR ANGIOGRAM EXTREMITY LEFT  02/07/2019   IR ANGIOGRAM EXTREMITY RIGHT  02/20/2019   IR TRANSCATH EXCRAN VERT OR CAR A STENT  02/20/2019   IR TRANSCATH EXCRAN VERT OR CAR A STENT  03/25/2019   IR US  GUIDE VASC ACCESS RIGHT  02/07/2019   LEFT HEART CATH AND CORONARY ANGIOGRAPHY N/A 10/20/2023   Procedure: LEFT HEART CATH AND CORONARY ANGIOGRAPHY;  Surgeon: Kyra Phy, MD;  [facility];  Service: Cardiovascular;  Laterality: N/A;   MULTIPLE EXTRACTIONS WITH ALVEOLOPLASTY N/A 02/24/2014  Procedure: MULTIPLE EXTRACTION WITH ALVEOLOPLASTY AND BIOPSY;  Surgeon: Cornelia Dieter, DDS;  Location: [facility];  Service: Oral Surgery;  Laterality: N/A;   PERIPHERAL VASCULAR ATHERECTOMY Left 06/25/2019   Procedure: PERIPHERAL VASCULAR ATHERECTOMY;  Surgeon: Margherita Shell, MD;  [facility];  Service: Cardiovascular;  Laterality: Left;  superficial femoral   PERIPHERAL VASCULAR BALLOON ANGIOPLASTY Left 10/22/2019   Procedure: PERIPHERAL VASCULAR BALLOON ANGIOPLASTY;  Surgeon: Margherita Shell, MD;  [facility];  Service: Cardiovascular;  Laterality: Left;  superficial femoral   PERIPHERAL VASCULAR INTERVENTION  04/01/2020   Procedure: PERIPHERAL VASCULAR INTERVENTION;  Surgeon: Young Hensen, MD;  [facility];  Service: Cardiovascular;;  left SFA stent    RADIOLOGY WITH ANESTHESIA N/A 02/20/2019   Procedure: RADIOLOGY WITH ANESTHESIA   STENTING;  Surgeon: Luellen Sages, MD;  [facility];  Service: Radiology;  Laterality: N/A;   RADIOLOGY WITH ANESTHESIA N/A 03/25/2019   Procedure: RADIOLOGY WITH ANESTHESIA STENTING;  Surgeon: Luellen Sages, MD;  Location: [facility];  Service: Radiology;  Laterality: N/A;   SKIN SPLIT GRAFT Left 07/03/2019   Procedure: APPLY SKIN GRAFT LEFT FOOT;  Surgeon: Timothy Ford, MD;  Location: [facility];  Service: Orthopedics;  Laterality: Left;   TRACHEOSTOMY  11/13   closed 1/14   TRANSRECTAL DRAINAGE OF PELVIC ABSCESS  12/11   VASCULAR SURGERY  04/01/2020   two stents placed in left leg    Allergies  Allergies  Allergen Reactions   Amitriptyline Other (See Comments)    Unresponsive    Contrast Media [Iodinated Contrast Media] Other (See Comments)    2013-had CVA, went into AFR-combination ABT, contrast, low BP   Morphine  And Codeine Other (See Comments)    Chest pain   Promethazine  Swelling    Lips swell   Compazine Other (See Comments)    -Alert but unable to move   Prochlorperazine Maleate Other (See Comments)    -Alert, but unable to move   Adhesive [Tape] Rash    Long term exposure causes skin to tear    Oxycodone  Nausea And Vomiting    Severe n/v -  patient will not take oxycodone  or percocet.   Sumatriptan Rash    History of Present Illness    MATEJA DIER has a PMH of coronary artery disease, CVA, chronic pain, HTN, HLD, seizure disorder, and peripheral arterial disease.  She was admitted with unstable angina.  She underwent cardiac catheterization and was noted to have multivessel  coronary disease.  CT surgery was consulted and did not feel that she was a candidate for CABG.  She underwent LHC with PCI and DES to her mid and distal circumflex 10/24/2023.  Her echocardiogram at that time showed an EF of 35-45%.  She was initiated on GDMT with metoprolol  and Farxiga  2/4.  She was seen in follow-up by Nieves Bars, NP on 11/03/2023.  She noted fatigue.  She presented with her parents.  They felt that she was at her baseline.  She did not have dizziness occasionally.  She also noted occasional shortness of breath.  She denied PND and orthopnea.  Her appetite was okay.  She denied fever and chills.  She was taking medications as prescribed.  She was living with her parents.  She presents to the clinic today for follow-up evaluation and states***.  *** denies chest pain, shortness of breath, lower extremity edema, fatigue, palpitations, melena, hematuria, hemoptysis, diaphoresis, weakness, presyncope, syncope, orthopnea, and  PND.  Coronary artery disease-denies chest pain today.  Underwent cardiac catheterization and was noted to have multivessel disease.  CT surgery was consulted and felt that she was not a good candidate for surgery.  She underwent PCI with DES to her mid and distal circumflex 10/24/2023. Continue aspirin , atorvastatin , metoprolol , Ranexa , Brilinta  Heart healthy low-sodium diet-salty 6 given Increase physical activity as tolerated   Hyperlipidemia-LDL***. High-fiber diet Continue aspirin , atorvastatin  Repeat fasting lipids and LFTs in 4 to 6 weeks.  Chronic systolic CHF-weight today.  Euvolemic.  Has resumed normal daily activities.  Was seen in follow-up by advanced heart failure team on 11/03/2023.  GDMT unable to be uptitrated at that time due to blood pressure. Heart healthy low-sodium diet Daily weights-weight log Continue metoprolol  succinate, Farxiga  Plan for repeat echocardiogram in August or September Following with advanced heart failure  team   History of CVA-neurologically intact.  History of memory loss/cognitive impairment.  Patient lives with her parents. Follows with PCP  Disposition: Follow-up with Dr. Lavonne Prairie or me in 3-4 months.   Home Medications    Prior to Admission medications   Medication Sig Start Date End Date Taking? Authorizing Provider  aspirin  81 MG EC tablet Take 81 mg by mouth daily.     [provider]  atorvastatin  (LIPITOR ) 80 MG tablet TAKE 1 TABLET BY MOUTH DAILY 01/10/23   Adine Hoof, MD  blood glucose meter kit and supplies Dispense based on patient and insurance preference. Use up to four times daily as directed. (FOR ICD-10 E10.9, E11.9). 07/05/19   Arrien, Mauricio Daniel, MD  dapagliflozin  propanediol (FARXIGA ) 10 MG TABS tablet Take 10 mg by mouth daily.     [provider]  diazepam  (VALIUM ) 5 MG tablet Take 5 mg by mouth 2 (two) times daily.  Patient not taking: Reported on 10/17/2023    [provider]  ferrous gluconate  (FERGON) 324 MG tablet Take 324 mg by mouth daily.    [provider]  FLUoxetine  (PROZAC ) 10 MG capsule Take 10 mg by mouth in the morning. 11/27/19   [provider]  HYDROcodone -acetaminophen  (NORCO/VICODIN) 5-325 MG tablet Take 1 tablet by mouth every 6 (six) hours as needed for severe pain. 03/07/22   Baglia, Corrina, PA-C  hydrOXYzine (VISTARIL) 25 MG capsule Take 25 mg by mouth 3 (three) times daily. 09/21/23   [provider]  levETIRAcetam  (KEPPRA  XR) 500 MG 24 hr tablet Take 2 tablets (1,000 mg total) by mouth daily. 05/08/23   Patel, Donika K, DO  magnesium  oxide (MAG-OX) 400 MG tablet Take 400 mg by mouth daily. 09/28/23 09/27/24  [provider]  metFORMIN  (GLUCOPHAGE ) 500 MG tablet Take 500 mg by mouth 3 (three) times daily. 11/16/18   [provider]  metoprolol  succinate (TOPROL -XL) 25 MG 24 hr tablet Take 0.5 tablets (12.5 mg total) by mouth daily. 10/27/23   Gonfa, Taye T, MD   nitroGLYCERIN  (NITROSTAT ) 0.4 MG SL tablet Place 1 tablet (0.4 mg total) under the tongue every 5 (five) minutes as needed for chest pain. 11/03/23 02/01/24  Clegg, Amy D, NP  pantoprazole  (PROTONIX ) 40 MG tablet Take 1 tablet (40 mg total) by mouth daily. 10/27/23   Gonfa, Taye T, MD  pregabalin  (LYRICA ) 150 MG capsule TAKE 1 CAPSULE BY MOUTH THREE TIMES DAILY 10/09/23   Patel, Donika K, DO  ranolazine  (RANEXA ) 500 MG 12 hr tablet Take 1 tablet (500 mg total) by mouth 2 (two) times daily. 10/26/23   Theadore Finger, MD  risperiDONE  (RISPERDAL ) 0.5 MG tablet Take 0.5 mg by mouth at bedtime.    [provider]  ticagrelor  (BRILINTA ) 90 MG TABS tablet Take 1 tablet (90 mg total) by mouth 2 (two) times daily. 10/26/23   Gonfa, Taye T, MD  tiZANidine  (ZANAFLEX ) 2 MG tablet TAKE 1 TABLET BY MOUTH AT BEDTIME AS NEEDED FOR MUSCLE SPASMS 07/15/21   Patel, Donika K, DO  traMADol  (ULTRAM ) 50 MG tablet Take 50 mg by mouth 4 (four) times daily. 01/13/21   [provider]  traZODone  (DESYREL ) 100 MG tablet Take 200 mg by mouth at bedtime.     [provider]    Family History    Family History  Problem Relation Age of Onset   Lung cancer Maternal Grandmother    Heart attack Maternal Grandfather    Diabetes Paternal Grandfather    She indicated that her mother is alive. She indicated that her father is alive. She indicated that the status of her maternal grandmother is unknown. She indicated that the status of her maternal grandfather is unknown. She indicated that the status of her paternal grandfather is unknown.  Social History    Social History   Socioeconomic History   Marital status: Single    Spouse name: Not on file   Number of children: 0   Years of education: 14   Highest education level: Not on file  Occupational History   Not on file  Tobacco Use   Smoking status: Former    Current packs/day: 0.00    Average packs/day: 0.6 packs/day for 23.0 years (13.8 ttl pk-yrs)     Types: Cigarettes    Start date: 02/05/1996    Quit date: 02/05/2019    Years since quitting: 4.7    Passive exposure: Never   Smokeless tobacco: Never  Vaping Use   Vaping status: Never Used  Substance and Sexual Activity   Alcohol use: No    Alcohol/week: 0.0 standard drinks of alcohol   Drug use: No   Sexual activity: Not Currently    Birth control/protection: None  Other Topics Concern   Not on file  Social History Narrative   She is single and does not have any children.    She has 2 yrs of college level education.   She has 5-6 caffeine drinks daily.    Left handed      Living with parents.   Social Drivers of Corporate investment banker Strain: Low Risk  (10/25/2023)   Overall Financial Resource Strain (CARDIA)    Difficulty of Paying Living Expenses: Not very hard  Food Insecurity: No Food Insecurity (06/30/2023)   Received [facility]   Hunger Vital Sign    Worried About Running Out of Food in the Last Year: Never true    Ran Out of Food in the Last Year: Never true  Transportation Needs: No Transportation Needs (10/25/2023)   PRAPARE - Administrator, Civil Service (Medical): No    Lack of Transportation (Non-Medical): No  Physical Activity: Insufficiently Active (06/30/2023)   Received from [facility]   Exercise Vital Sign    Days of Exercise per Week: 2 days    Minutes of Exercise per Session: 30 min  Stress: No Stress Concern Present (06/30/2023)   Received from [facility] - Occupational Stress Questionnaire    Feeling of Stress : Not at all  Social Connections: Patient Unable To Answer (  10/18/2023)   Social Connection and Isolation Panel [NHANES]    Frequency of Communication with Friends and Family: Patient unable to answer    Frequency of Social Gatherings with Friends and Family: Patient unable to answer    Attends Religious Services: Patient unable to answer    Active Member of  Clubs or Organizations: Patient unable to answer    Attends Banker Meetings: Patient unable to answer    Marital Status: Patient unable to answer  Intimate Partner Violence: Unknown (09/16/2021)   Received from [facility]   HITS    Physically Hurt: Not on file    Insult or Talk Down To: Not on file    Threaten Physical Harm: Not on file    Scream or Curse: Not on file     Review of Systems    General:  No chills, fever, night sweats or weight changes.  Cardiovascular:  No chest pain, dyspnea on exertion, edema, orthopnea, palpitations, paroxysmal nocturnal dyspnea. Dermatological: No rash, lesions/masses Respiratory: No cough, dyspnea Urologic: No hematuria, dysuria Abdominal:   No nausea, vomiting, diarrhea, bright red blood per rectum, melena, or hematemesis Neurologic:  No visual changes, wkns, changes in mental status. All other systems reviewed and are otherwise negative except as noted above.  Physical Exam    VS:  There were no vitals taken for this visit. , BMI There is no height or weight on file to calculate BMI. GEN: Well nourished, well developed, in no acute distress. HEENT: normal. Neck: Supple, no JVD, carotid bruits, or masses. Cardiac: RRR, no murmurs, rubs, or gallops. No clubbing, cyanosis, edema.  Radials/DP/PT 2+ and equal bilaterally.  Respiratory:  Respirations regular and unlabored, clear to auscultation bilaterally. GI: Soft, nontender, nondistended, BS + x 4. MS: no deformity or atrophy. Skin: warm and dry, no rash. Neuro:  Strength and sensation are intact. Psych: Normal affect.  Accessory Clinical Findings    Recent Labs: 10/17/2023: ALT 26; B Natriuretic Peptide 353.2; TSH 0.725 10/26/2023: Hemoglobin 9.3; Magnesium  2.1; Platelets 246 11/03/2023: BUN 14; Creatinine, Ser 0.89; Potassium 4.4; Sodium 138   Recent Lipid Panel    Component Value Date/Time   CHOL 136 10/18/2023 0250   TRIG 150 (H) 10/18/2023 0250   HDL  59 10/18/2023 0250   CHOLHDL 2.3 10/18/2023 0250   VLDL 30 10/18/2023 0250   LDLCALC 47 10/18/2023 0250    No BP recorded.  {Refresh Note OR Click here to enter BP  :1}***    ECG personally reviewed by me today- ***    Echocardiogram 10/20/2023 IMPRESSIONS     1. Septal , mid/basal inferior wall and lateral wall hypokinesis . Left  ventricular ejection fraction, by estimation, is 35 to 40%. The left  ventricle has moderately decreased function. The left ventricle  demonstrates regional wall motion abnormalities  (see scoring diagram/findings for description). The left ventricular  internal cavity size was mildly dilated. Left ventricular diastolic  parameters are consistent with Grade II diastolic dysfunction  (pseudonormalization). Elevated left ventricular  end-diastolic pressure.   2. Right ventricular systolic function is normal. The right ventricular  size is normal.   3. Left atrial size was moderately dilated.   4. Moderate subvalvular calcification of the posterior leaflet chords .  The mitral valve is abnormal. Trivial mitral valve regurgitation. No  evidence of mitral stenosis.   5. The aortic valve is tricuspid. Aortic valve regurgitation is not  visualized. No aortic stenosis is present.   6. The inferior  vena cava is normal in size with greater than 50%  respiratory variability, suggesting right atrial pressure of 3 mmHg.   FINDINGS   Left Ventricle: Septal , mid/basal inferior wall and lateral wall  hypokinesis. Left ventricular ejection fraction, by estimation, is 35 to  40%. The left ventricle has moderately decreased function. The left  ventricle demonstrates regional wall motion  abnormalities. Strain was performed and the global longitudinal strain is  indeterminate. The left ventricular internal cavity size was mildly  dilated. There is no left ventricular hypertrophy. Left ventricular  diastolic parameters are consistent with  Grade II diastolic  dysfunction (pseudonormalization). Elevated left  ventricular end-diastolic pressure.   Right Ventricle: The right ventricular size is normal. No increase in  right ventricular wall thickness. Right ventricular systolic function is  normal.   Left Atrium: Left atrial size was moderately dilated.   Right Atrium: Right atrial size was normal in size.   Pericardium: Trivial pericardial effusion is present. The pericardial  effusion is posterior to the left ventricle.   Mitral Valve: Moderate subvalvular calcification of the posterior leaflet  chords. The mitral valve is abnormal. There is mild thickening of the  mitral valve leaflet(s). There is mild calcification of the mitral valve  leaflet(s). Mild mitral annular  calcification. Trivial mitral valve regurgitation. No evidence of mitral  valve stenosis.   Tricuspid Valve: The tricuspid valve is normal in structure. Tricuspid  valve regurgitation is trivial. No evidence of tricuspid stenosis.   Aortic Valve: The aortic valve is tricuspid. Aortic valve regurgitation is  not visualized. No aortic stenosis is present.   Pulmonic Valve: The pulmonic valve was normal in structure. Pulmonic valve  regurgitation is not visualized. No evidence of pulmonic stenosis.   Aorta: The aortic root is normal in size and structure.   Venous: The inferior vena cava is normal in size with greater than 50%  respiratory variability, suggesting right atrial pressure of 3 mmHg.   IAS/Shunts: No atrial level shunt detected by color flow Doppler.    LHC 10/24/2023    Dist LM lesion is 50% stenosed.   Prox LAD lesion is 70% stenosed.  1st Diag lesion is 70% stenosed-eccentric calcified.  Mid LAD lesion is 40% stenosed.   Ost Cx to Prox Cx lesion is 70% stenosed -eccentric calcified.   CULPRIT LESION: Mid Cx to Dist Cx lesion is 99% stenosed.   A drug-eluting stent was successfully placed using a STENT SYNERGY XD 2.50X12 -> postdilated in tapered  fashion from 2.9-2.6 mm.  TIMI-3 flow maintained.Aaron Aas  Post intervention, there is a 0% residual stenosis.   Lesion #2: Dist Cx lesion is 99% stenosed.   Balloon angioplasty was performed using a BALLOON TAKERU 1.5X12.Post intervention, there is a 50% residual stenosis.   Diagnostic: Dominance: Right                                                   Intervention               Successful DES PCI of the most significant lesion-99% mid-distal LCx using Synergy XD 2.5 mm x 12 mm stent deployed in tapered fashion from 2.9-2.6 mm.   Balloon angioplasty of 99% lesion distal to stent and small caliber vessel using 1.5 mm 12 mm balloon.  Reducing 99% to 50% stenosis.   The decision to  treat the most significant culprit lesion as the first PCI option was based on the extent of disease and potential difficulty of treating LAD and LCx with existing RCA disease.  Preferably she will do well with PCI of the 99% stenosis and medical management on the remainder of the vessels.   RECOMMENDATIONS Anticipated discharge date to be determined. Plan will be to treat existing disease in the LM-ostial LCx, proximal LAD and diffuse RCA with medical management. If she were to have recurrent anginal symptoms after this PCI would consider debulking PCI of the proximal LAD crossing the diagonal branch but sparing the ostium of the LAD.  This would be step to a PCI if necessary. Step 3 PCI if she continues to have symptoms despite mid LCx and proximal LAD PCI, with then consider distal LM-ostial LCx PCI from LM into LCx with provisional PTCA/PCI of the ostial LAD although this would be preferably last choice to avoid left main PCI. Would recommend treating the RCA medically based on the diffuse nature of the disease as it would require essentially full metal jacket throughout the entire vessel to adequately treat. Recommend uninterrupted dual antiplatelet therapy with Aspirin  81mg  daily and Ticagrelor  90mg  twice daily for a  minimum of 12 months (ACS-Class I recommendation). Would continue long-term Thienopyridine coverage after initial year based on extent of disease.  Can reduce to 60 mg twice daily Brilinta  versus converting to clopidogrel  75 mg daily.    Assessment & Plan   1.  ***   Chet Cota. Sharrell Krawiec NP-C     11/20/2023, 12:53 PM [facility] [address] Office [phone] Fax [phone]    I spent***minutes examining this patient, reviewing medications, and using patient centered shared decision making involving their cardiac care.   I spent  20 minutes reviewing past medical history,  medications, and prior cardiac tests.

## 2023-11-21 ENCOUNTER — Ambulatory Visit: Payer: Self-pay | Admitting: *Deleted

## 2023-11-22 ENCOUNTER — Encounter (HOSPITAL_COMMUNITY)
Admission: RE | Admit: 2023-11-22 | Discharge: 2023-11-22 | Disposition: A | Discharge status: Home / Self Care | Source: Ambulatory Visit | Attending: Cardiology | Admitting: Cardiology

## 2023-11-22 DIAGNOSIS — Z955 Presence of coronary angioplasty implant and graft: Secondary | ICD-10-CM | POA: Diagnosis not present

## 2023-11-22 LAB — GLUCOSE, CAPILLARY
Glucose-Capillary: 253 mg/dL — ABNORMAL HIGH (ref 70–99)
Glucose-Capillary: 193 mg/dL — ABNORMAL HIGH (ref 70–99)

## 2023-11-22 NOTE — Progress Notes (Signed)
 Daily Session Note  Patient Details  Name: Taylor [name] MRN: [id number] Date of Birth: 1980/08/29 Referring Provider:   Flowsheet Row CARDIAC REHAB PHASE II ORIENTATION from 11/14/2023 in [facility] CARDIAC REHABILITATION  Referring Provider [name] MD       Encounter Date: 11/22/2023  Check In:  Session Check In - 11/22/23 1106       Check-In   Supervising physician immediately available to respond to emergencies See telemetry face sheet for immediately available MD    Location [facility]    Staff Present Rita Cherry, MA, RCEP, CCRP, CCET;Hillary Havana BSN, RN;Heather Liberty, [place], Exercise Physiologist    Virtual Visit No    Medication changes reported     Yes    Comments Started on Flexeril PRN 11/21/23 for muscle spasms    Fall or balance concerns reported    No    Tobacco Cessation No Change    Warm-up and Cool-down Performed on first and last piece of equipment    Resistance Training Performed Yes    VAD Patient? No    PAD/SET Patient? No      Pain Assessment   Currently in Pain? No/denies             Capillary Blood Glucose: No results found for this or any previous visit (from the past 24 hours).    Social History   Tobacco Use  Smoking Status Former   Current packs/day: 0.00   Average packs/day: 0.6 packs/day for 23.0 years (13.8 ttl pk-yrs)   Types: Cigarettes   Start date: 02/05/1996   Quit date: 02/05/2019   Years since quitting: 4.7   Passive exposure: Never  Smokeless Tobacco Never    Goals Met:  Independence with exercise equipment Exercise tolerated well No report of concerns or symptoms today Strength training completed today  Goals Unmet:  Not Applicable  Comments: Pt able to follow exercise prescription today without complaint.  Will continue to monitor for progression.

## 2023-11-23 ENCOUNTER — Other Ambulatory Visit (HOSPITAL_COMMUNITY): Payer: Self-pay

## 2023-11-23 ENCOUNTER — Ambulatory Visit: Admitting: General Practice

## 2023-11-24 ENCOUNTER — Encounter (HOSPITAL_COMMUNITY)
Admission: RE | Admit: 2023-11-24 | Discharge: 2023-11-24 | Disposition: A | Discharge status: Home / Self Care | Source: Ambulatory Visit | Attending: Cardiology | Admitting: Cardiology

## 2023-11-24 DIAGNOSIS — Z955 Presence of coronary angioplasty implant and graft: Secondary | ICD-10-CM | POA: Diagnosis not present

## 2023-11-24 NOTE — Progress Notes (Signed)
 Daily Session Note  Patient Details  Name: Taylor [name] MRN: [id number] Date of Birth: Aug 11, 1980 Referring Provider:   Flowsheet Row CARDIAC REHAB PHASE II ORIENTATION from 11/14/2023 in [facility] CARDIAC REHABILITATION  Referring Provider [name] MD    Encounter Date: 11/24/2023  Check In:  Session Check In - 11/24/23 1045       Check-In   Supervising physician immediately available to respond to emergencies See telemetry face sheet for immediately available MD    Location [facility]    Staff Present Clotilda Danish, BS, Exercise Physiologist;Brittany Annette Barters, BSN, RN, Tisha Forget, RN, BSN    Virtual Visit No    Medication changes reported     No    Fall or balance concerns reported    No    Tobacco Cessation No Change    Warm-up and Cool-down Performed on first and last piece of equipment    Resistance Training Performed Yes    VAD Patient? No    PAD/SET Patient? No      Pain Assessment   Currently in Pain? No/denies    Multiple Pain Sites No          Capillary Blood Glucose: No results found for this or any previous visit (from the past 24 hours).    Social History   Tobacco Use  Smoking Status Former   Current packs/day: 0.00   Average packs/day: 0.6 packs/day for 23.0 years (13.8 ttl pk-yrs)   Types: Cigarettes   Start date: 02/05/1996   Quit date: 02/05/2019   Years since quitting: 4.8   Passive exposure: Never  Smokeless Tobacco Never    Goals Met:  Independence with exercise equipment Exercise tolerated well No report of concerns or symptoms today Strength training completed today  Goals Unmet:  Not Applicable  Comments: Pt able to follow exercise prescription today without complaint.  Will continue to monitor for progression.Aaron Aas

## 2023-11-27 ENCOUNTER — Encounter (HOSPITAL_COMMUNITY)
Admission: RE | Admit: 2023-11-27 | Discharge: 2023-11-27 | Disposition: A | Discharge status: Home / Self Care | Source: Ambulatory Visit | Attending: Cardiology | Admitting: Cardiology

## 2023-11-29 ENCOUNTER — Encounter (HOSPITAL_COMMUNITY)
Admission: RE | Admit: 2023-11-29 | Discharge: 2023-11-29 | Disposition: A | Discharge status: Home / Self Care | Source: Ambulatory Visit | Attending: Cardiology | Admitting: Cardiology

## 2023-11-29 ENCOUNTER — Encounter (HOSPITAL_COMMUNITY): Payer: Self-pay

## 2023-11-29 DIAGNOSIS — Z955 Presence of coronary angioplasty implant and graft: Secondary | ICD-10-CM

## 2023-11-29 LAB — GLUCOSE, CAPILLARY: Glucose-Capillary: 212 mg/dL — ABNORMAL HIGH (ref 70–99)

## 2023-11-29 NOTE — Progress Notes (Signed)
 Incomplete Session Note  Patient Details  Name: Taylor [name] MRN: [id number] Date of Birth: Oct 14, 1980 Referring Provider:   Flowsheet Row CARDIAC REHAB PHASE II ORIENTATION from 11/14/2023 in [facility] CARDIAC REHABILITATION  Referring Provider [name] MD    [name] did not complete her rehab session.  Pt has chronic back pain but the pain level in her lower mid back was severe today.  BS was 212 and BP was 114/70 before pt left.  The pt tried the arm ergometer but started crying and decided she wanted to go home.

## 2023-11-29 NOTE — Progress Notes (Signed)
 Daily Session Note  Patient Details  Name: Taylor [name] MRN: [id number] Date of Birth: Apr 09, 1981 Referring Provider:   Flowsheet Row CARDIAC REHAB PHASE II ORIENTATION from 11/14/2023 in [facility] CARDIAC REHABILITATION  Referring Provider [name] MD    Encounter Date: 11/29/2023  Check In:  Session Check In - 11/29/23 1040       Check-In   Supervising physician immediately available to respond to emergencies See telemetry face sheet for immediately available MD    Location [facility]    Staff Present Ronna Coho BSN, RN;Heather Toy Freund, [place], Exercise Physiologist;Brooke Rollin Clock, RN    Virtual Visit No    Medication changes reported     No    Fall or balance concerns reported    No    Tobacco Cessation No Change    Warm-up and Cool-down Performed on first and last piece of equipment    Resistance Training Performed Yes    VAD Patient? No    PAD/SET Patient? No      Pain Assessment   Currently in Pain? No/denies    Multiple Pain Sites No          Capillary Blood Glucose: No results found for this or any previous visit (from the past 24 hours).    Social History   Tobacco Use  Smoking Status Former   Current packs/day: 0.00   Average packs/day: 0.6 packs/day for 23.0 years (13.8 ttl pk-yrs)   Types: Cigarettes   Start date: 02/05/1996   Quit date: 02/05/2019   Years since quitting: 4.8   Passive exposure: Never  Smokeless Tobacco Never    Goals Met:  Independence with exercise equipment Exercise tolerated well No report of concerns or symptoms today Strength training completed today  Goals Unmet:  Not Applicable  Comments: .Pt able to follow exercise prescription today without complaint.  Will continue to monitor for progression.

## 2023-11-29 NOTE — Progress Notes (Signed)
 Cardiac Individual Treatment Plan  Patient Details  Name: Taylor [name] MRN: [id number] Date of Birth: 02-Nov-1980 Referring Provider:   Flowsheet Row CARDIAC REHAB PHASE II ORIENTATION from 11/14/2023 in [place] [facility]  Referring Provider [name] MD    Initial Encounter Date:  Flowsheet Row CARDIAC REHAB PHASE II ORIENTATION from 11/14/2023 in [place] [facility]  Date 11/14/23    Visit Diagnosis: No diagnosis found.  Patient's Home Medications on Admission:  Current Outpatient Medications:    aspirin  81 MG EC tablet, Take 81 mg by mouth daily. , Disp: , Rfl:    atorvastatin  (LIPITOR ) 80 MG tablet, TAKE 1 TABLET BY MOUTH DAILY, Disp: 30 tablet, Rfl: 0   blood glucose meter kit and supplies, Dispense based on patient and insurance preference. Use up to four times daily as directed. (FOR ICD-10 E10.9, E11.9)., Disp: 1 each, Rfl: 0   dapagliflozin  propanediol (FARXIGA ) 10 MG TABS tablet, Take 10 mg by mouth daily. , Disp: , Rfl:    diazepam  (VALIUM ) 5 MG tablet, Take 5 mg by mouth 2 (two) times daily.  (Patient not taking: Reported on 10/17/2023), Disp: , Rfl:    ferrous gluconate  (FERGON) 324 MG tablet, Take 324 mg by mouth daily., Disp: , Rfl:    FLUoxetine  (PROZAC ) 10 MG capsule, Take 10 mg by mouth in the morning., Disp: , Rfl:    HYDROcodone -acetaminophen  (NORCO/VICODIN) 5-325 MG tablet, Take 1 tablet by mouth every 6 (six) hours as needed for severe pain., Disp: 20 tablet, Rfl: 0   hydrOXYzine (VISTARIL) 25 MG capsule, Take 25 mg by mouth 3 (three) times daily., Disp: , Rfl:    levETIRAcetam  (KEPPRA  XR) 500 MG 24 hr tablet, Take 2 tablets (1,000 mg total) by mouth daily., Disp: 180 tablet, Rfl: 3   magnesium  oxide (MAG-OX) 400 MG tablet, Take 400 mg by mouth daily., Disp: , Rfl:    metFORMIN  (GLUCOPHAGE ) 500 MG tablet, Take 500 mg by mouth 3 (three) times daily., Disp: , Rfl:    metoprolol  succinate (TOPROL -XL) 25 MG 24 hr tablet, Take 0.5 tablets  (12.5 mg total) by mouth daily., Disp: 45 tablet, Rfl: 0   nitroGLYCERIN  (NITROSTAT ) 0.4 MG SL tablet, Place 1 tablet (0.4 mg total) under the tongue every 5 (five) minutes as needed for chest pain., Disp: 90 tablet, Rfl: 3   pantoprazole  (PROTONIX ) 40 MG tablet, Take 1 tablet (40 mg total) by mouth daily., Disp: 30 tablet, Rfl: 0   pregabalin  (LYRICA ) 150 MG capsule, TAKE 1 CAPSULE BY MOUTH THREE TIMES DAILY, Disp: 90 capsule, Rfl: 5   ranolazine  (RANEXA ) 500 MG 12 hr tablet, Take 1 tablet (500 mg total) by mouth 2 (two) times daily., Disp: 180 tablet, Rfl: 0   risperiDONE  (RISPERDAL ) 0.5 MG tablet, Take 0.5 mg by mouth at bedtime., Disp: , Rfl:    ticagrelor  (BRILINTA ) 90 MG TABS tablet, Take 1 tablet (90 mg total) by mouth 2 (two) times daily., Disp: 180 tablet, Rfl: 0   tiZANidine  (ZANAFLEX ) 2 MG tablet, TAKE 1 TABLET BY MOUTH AT BEDTIME AS NEEDED FOR MUSCLE SPASMS, Disp: 30 tablet, Rfl: 2   traMADol  (ULTRAM ) 50 MG tablet, Take 50 mg by mouth 4 (four) times daily., Disp: , Rfl:    traZODone  (DESYREL ) 100 MG tablet, Take 200 mg by mouth at bedtime. 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/2012   due to elevated blood sugar 1700-off insulin  6 months   Depression    Diabetes mellitus    off insulin  6 months   Diverticulitis    Dyspnea    when walking   GERD (gastroesophageal reflux disease)    Hypertension    Kidney dialysis status    on dialysis(stroke) off after 4-6 weeks   Pneumonia 2013, 06/2018   Respiratory failure (HCC) 04/2012   with stroke - in hosp [facility] 12 weeks   Seizures (HCC)    during elvated blood sugsr episode 11/13   Sepsis (HCC)    Stroke (HCC)    11/2018, 12/2018 Weak left side, speech- slurred, Short term memeroy, Gait unsteady    Vocal cord dysfunction     Tobacco Use: Social History    Tobacco Use  Smoking Status Former   Current packs/day: 0.00   Average packs/day: 0.6 packs/day for 23.0 years (13.8 ttl pk-yrs)   Types: Cigarettes   Start date: 02/05/1996   Quit date: 02/05/2019   Years since quitting: 4.8   Passive exposure: Never  Smokeless Tobacco Never    Labs: Review Flowsheet  More data exists      Latest Ref Rng & Units 02/01/2022 02/04/2022 02/05/2022 10/17/2023 10/18/2023  Labs for ITP Cardiac and Pulmonary Rehab  Cholestrol 0 - 200 mg/dL - - 161  - 096   LDL (calc) 0 - 99 mg/dL - - 28  - 47   HDL-C >04 mg/dL - - 39  - 59   Trlycerides <150 mg/dL - - 540  - 981   Hemoglobin A1c 4.8 - 5.6 % - 6.9  - 8.0  -  TCO2 22 - 32 mmol/L 26  - - - -    Capillary Blood Glucose: Lab Results  Component Value Date   GLUCAP 193 (H) 11/22/2023   GLUCAP 253 (H) 11/22/2023   GLUCAP 154 (H) 11/20/2023   GLUCAP 176 (H) 11/20/2023   GLUCAP 139 (H) 10/26/2023     Exercise Target Goals: Exercise Program Goal: Individual exercise prescription set using results from initial 6 min walk test and THRR while considering  patient's activity barriers and safety.   Exercise Prescription Goal: Starting with aerobic activity 30 plus minutes a day, 3 days per week for initial exercise prescription. Provide home exercise prescription and guidelines that participant acknowledges understanding prior to discharge.  Activity Barriers & Risk Stratification:  Activity Barriers & Cardiac Risk Stratification - 11/14/23 1305       Activity Barriers & Cardiac Risk Stratification   Activity Barriers Deconditioning;Balance Concerns;Back Problems;Shortness of Breath;Chest Pain/Angina;Decreased Ventricular Function;Other (comment);Muscular Weakness    Comments Hx three strokes with residuals (limp), not candidate for bypass, no chest pain    Cardiac Risk Stratification High          6 Minute Walk:  6 Minute Walk     Row Name 11/14/23 1433         6 Minute Walk   Phase Initial      Distance 600 feet     Walk Time 3.75 minutes     # of Rest Breaks 1  1:10 seat rest, stopped at 4:55     MPH 1.82     METS 2.83     RPE 17     Perceived Dyspnea  3     VO2 Peak 9.92     Symptoms Yes (comment)  Comments SOB, legs tingling, chest pain 6/10     Resting HR 94 bpm     Resting BP 124/62     Resting Oxygen Saturation  97 %     Exercise Oxygen Saturation  during 6 min walk 97 %     Max Ex. HR 129 bpm     Max Ex. BP 146/72     2 Minute Post BP 124/66        Oxygen Initial Assessment:   Oxygen Re-Evaluation:   Oxygen Discharge (Final Oxygen Re-Evaluation):   Initial Exercise Prescription:  Initial Exercise Prescription - 11/14/23 1400       Date of Initial Exercise RX and Referring Provider   Date 11/14/23    Referring Provider [name] MD    Expected Discharge Date 02/02/24   Medicaid     Oxygen   Maintain Oxygen Saturation 88% or higher      NuStep   Level 1    SPM 80    Minutes 15    METs 2      REL-XR   Level 1    Speed 50    Minutes 15    METs 2      Prescription Details   Frequency (times per week) 3    Duration Progress to 30 minutes of continuous aerobic without signs/symptoms of physical distress      Intensity   THRR 40-80% of Max Heartrate 127-160    Ratings of Perceived Exertion 11-13    Perceived Dyspnea 0-4      Progression   Progression Continue to progress workloads to maintain intensity without signs/symptoms of physical distress.      Resistance Training   Training Prescription Yes    Weight 3 lb    Reps 10-15          Perform Capillary Blood Glucose checks as needed.  Exercise Prescription Changes:   Exercise Prescription Changes     Row Name 11/14/23 1400             Response to Exercise   Blood Pressure (Admit) 124/62       Blood Pressure (Exercise) 146/72       Blood Pressure (Exit) 124/66       Heart Rate (Admit) 94 bpm       Heart Rate (Exercise) 129 bpm       Heart Rate (Exit) 97  bpm       Oxygen Saturation (Admit) 97 %       Oxygen Saturation (Exercise) 97 %       Oxygen Saturation (Exit) 96 %       Rating of Perceived Exertion (Exercise) 17       Perceived Dyspnea (Exercise) 3       Symptoms SOB, legs tingling, chest pain (6/10)       Comments walk test results          Exercise Comments:   Exercise Comments     Row Name 11/20/23 1059           Exercise Comments First full day of exercise!  Patient was oriented to gym and equipment including functions, settings, policies, and procedures.  Patient's individual exercise prescription and treatment plan were reviewed.  All starting workloads were established based on the results of the 6 minute walk test done at initial orientation visit.  The plan for exercise progression was also introduced and progression will be customized based on patient's performance and goals.  Exercise Goals and Review:   Exercise Goals     Row Name 11/14/23 1436             Exercise Goals   Increase Physical Activity Yes       Intervention Provide advice, education, support and counseling about physical activity/exercise needs.;Develop an individualized exercise prescription for aerobic and resistive training based on initial evaluation findings, risk stratification, comorbidities and participant's personal goals.       Expected Outcomes Short Term: Attend rehab on a regular basis to increase amount of physical activity.;Long Term: Add in home exercise to make exercise part of routine and to increase amount of physical activity.;Long Term: Exercising regularly at least 3-5 days a week.       Increase Strength and Stamina Yes       Intervention Provide advice, education, support and counseling about physical activity/exercise needs.;Develop an individualized exercise prescription for aerobic and resistive training based on initial evaluation findings, risk stratification, comorbidities and participant's personal goals.        Expected Outcomes Short Term: Increase workloads from initial exercise prescription for resistance, speed, and METs.;Short Term: Perform resistance training exercises routinely during rehab and add in resistance training at home;Long Term: Improve cardiorespiratory fitness, muscular endurance and strength as measured by increased METs and functional capacity ( )       Able to understand and use rate of perceived exertion (RPE) scale Yes       Intervention Provide education and explanation on how to use RPE scale       Expected Outcomes Short Term: Able to use RPE daily in rehab to express subjective intensity level;Long Term:  Able to use RPE to guide intensity level when exercising independently       Able to understand and use Dyspnea scale Yes       Intervention Provide education and explanation on how to use Dyspnea scale       Expected Outcomes Long Term: Able to use Dyspnea scale to guide intensity level when exercising independently;Short Term: Able to use Dyspnea scale daily in rehab to express subjective sense of shortness of breath during exertion       Knowledge and understanding of Target Heart Rate Range (THRR) Yes       Intervention Provide education and explanation of THRR including how the numbers were predicted and where they are located for reference       Expected Outcomes Short Term: Able to state/look up THRR;Long Term: Able to use THRR to govern intensity when exercising independently;Short Term: Able to use daily as guideline for intensity in rehab       Able to check pulse independently Yes       Intervention Provide education and demonstration on how to check pulse in carotid and radial arteries.;Review the importance of being able to check your own pulse for safety during independent exercise       Expected Outcomes Short Term: Able to explain why pulse checking is important during independent exercise;Long Term: Able to check pulse independently and accurately        Understanding of Exercise Prescription Yes       Intervention Provide education, explanation, and written materials on patient's individual exercise prescription       Expected Outcomes Short Term: Able to explain program exercise prescription;Long Term: Able to explain home exercise prescription to exercise independently          Exercise Goals Re-Evaluation :  Exercise Goals Re-Evaluation  Row Name 11/20/23 1059             Exercise Goal Re-Evaluation   Exercise Goals Review Able to understand and use rate of perceived exertion (RPE) scale;Able to understand and use Dyspnea scale;Understanding of Exercise Prescription       Comments Reviewed RPE and dyspnea scale, THR and program prescription with pt today.  Pt voiced understanding and was given a copy of goals to take home.       Expected Outcomes Short: Use RPE daily to regulate intensity.  Long: Follow program prescription in THR.           Discharge Exercise Prescription (Final Exercise Prescription Changes):  Exercise Prescription Changes - 11/14/23 1400       Response to Exercise   Blood Pressure (Admit) 124/62    Blood Pressure (Exercise) 146/72    Blood Pressure (Exit) 124/66    Heart Rate (Admit) 94 bpm    Heart Rate (Exercise) 129 bpm    Heart Rate (Exit) 97 bpm    Oxygen Saturation (Admit) 97 %    Oxygen Saturation (Exercise) 97 %    Oxygen Saturation (Exit) 96 %    Rating of Perceived Exertion (Exercise) 17    Perceived Dyspnea (Exercise) 3    Symptoms SOB, legs tingling, chest pain (6/10)    Comments walk test results          Nutrition:  Target Goals: Understanding of nutrition guidelines, daily intake of sodium 1500mg , cholesterol 200mg , calories 30% from fat and 7% or less from saturated fats, daily to have 5 or more servings of fruits and vegetables.  Biometrics:  Pre Biometrics - 11/14/23 1437       Pre Biometrics   Height 5' 2.5 (1.588 m)    Weight 99.4 kg    Waist Circumference 47  inches    Hip Circumference 44 inches    Waist to Hip Ratio 1.07 %    BMI (Calculated) 39.41    Grip Strength 26.5 kg    Single Leg Stand 3.4 seconds           Nutrition Therapy Plan and Nutrition Goals:  Nutrition Therapy & Goals - 11/14/23 1437       Intervention Plan   Intervention Prescribe, educate and counsel regarding individualized specific dietary modifications aiming towards targeted core components such as weight, hypertension, lipid management, diabetes, heart failure and other comorbidities.;Nutrition handout(s) given to patient.    Expected Outcomes Short Term Goal: Understand basic principles of dietary content, such as calories, fat, sodium, cholesterol and nutrients.;Long Term Goal: Adherence to prescribed nutrition plan.          Nutrition Assessments:  MEDIFICTS Score Key: >=70 Need to make dietary changes  40-70 Heart Healthy Diet <= 40 Therapeutic Level Cholesterol Diet  Flowsheet Row CARDIAC REHAB PHASE II ORIENTATION from 11/14/2023 in [facility]  Picture Your Plate Total Score on Admission 54   Picture Your Plate Scores: <16 Unhealthy dietary pattern with much room for improvement. 41-50 Dietary pattern unlikely to meet recommendations for good health and room for improvement. 51-60 More healthful dietary pattern, with some room for improvement.  >60 Healthy dietary pattern, although there may be some specific behaviors that could be improved.    Nutrition Goals Re-Evaluation:   Nutrition Goals Discharge (Final Nutrition Goals Re-Evaluation):   Psychosocial: Target Goals: Acknowledge presence or absence of significant depression and/or stress, maximize coping skills, provide positive support system. Participant is able to verbalize types  and ability to use techniques and skills needed for reducing stress and depression.  Initial Review & Psychosocial Screening:  Initial Psych Review & Screening - 11/09/23 1004        Initial Review   Current issues with History of Depression;Current Anxiety/Panic;Current Psychotropic Meds;Current Depression      Family Dynamics   Good Support System? Yes      Barriers   Psychosocial barriers to participate in program The patient should benefit from training in stress management and relaxation.;There are no identifiable barriers or psychosocial needs.      Screening Interventions   Interventions Encouraged to exercise;To provide support and resources with identified psychosocial needs;Provide feedback about the scores to participant    Expected Outcomes Short Term goal: Utilizing psychosocial counselor, staff and physician to assist with identification of specific Stressors or current issues interfering with healing process. Setting desired goal for each stressor or current issue identified.;Long Term Goal: Stressors or current issues are controlled or eliminated.;Short Term goal: Identification and review with participant of any Quality of Life or Depression concerns found by scoring the questionnaire.;Long Term goal: The participant improves quality of Life and PHQ9 Scores as seen by post scores and/or verbalization of changes          Quality of Life Scores:  Scores of 19 and below usually indicate a poorer quality of life in these areas.  A difference of  2-3 points is a clinically meaningful difference.  A difference of 2-3 points in the total score of the Quality of Life Index has been associated with significant improvement in overall quality of life, self-image, physical symptoms, and general health in studies assessing change in quality of life.  PHQ-9: Review Flowsheet       11/14/2023  Depression screen PHQ 2/9  Decreased Interest 1  Down, Depressed, Hopeless 1  PHQ - 2 Score 2  Altered sleeping 1  Tired, decreased energy 1  Change in appetite 1  Feeling bad or failure about yourself  1  Trouble concentrating 0  Moving slowly or fidgety/restless 1   Suicidal thoughts 0  PHQ-9 Score 7  Difficult doing work/chores Somewhat difficult   Interpretation of Total Score  Total Score Depression Severity:  1-4 = Minimal depression, 5-9 = Mild depression, 10-14 = Moderate depression, 15-19 = Moderately severe depression, 20-27 = Severe depression   Psychosocial Evaluation and Intervention:  Psychosocial Evaluation - 11/09/23 1006       Psychosocial Evaluation & Interventions   Interventions Stress management education;Relaxation education;Encouraged to exercise with the program and follow exercise prescription    Comments Patient was referred to CR with Stent placement. Her mother answered all questions. Patient has a long history of depression and anxiety and seizure disorder. She lives with her parents who support her along with her brother. Her mother is her legal guardian. She is seeing a psychiatrist routinely but her mother says they are currently looking for another therapist. She is on diazepam , prozac , risperdal  and Trazodone  for management of her psychosocial issure. She sleeps well at night. Her mother says her depression has worsened since Januray 2025 when she developed chronic back pain. She has been evaulated but no cause has been found. She says she is not motivated to do a lot of activities due to the pain. 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 Her goals for the program are to lose weight, improve her SOB and get more active. Her mother  has done cardiac rehab here and she is very motivated for her daughter to participate; however, her chronic pain and depression could be a barrier for her to particiapte consistently.    Expected Outcomes Short Term: Patient will start the program and attend consistently. Long Term: Patient will complete the program meeting personal goals.    Continue Psychosocial Services  Follow up required by staff          Psychosocial Re-Evaluation:   Psychosocial  Discharge (Final Psychosocial Re-Evaluation):   Vocational Rehabilitation: Provide vocational rehab assistance to qualifying candidates.   Vocational Rehab Evaluation & Intervention:  Vocational Rehab - 11/09/23 1003       Initial Vocational Rehab Evaluation & Intervention   Assessment shows need for Vocational Rehabilitation No      Vocational Rehab Re-Evaulation   Comments Patient is disabled.          Education: Education Goals: Education classes will be provided on a weekly basis, covering required topics. Participant will state understanding/return demonstration of topics presented.  Learning Barriers/Preferences:  Learning Barriers/Preferences - 11/09/23 1003       Learning Barriers/Preferences   Learning Barriers None    Learning Preferences Written Material;Audio;Skilled Demonstration          Education Topics: Hypertension, Hypertension Reduction -Define heart disease and high blood pressure. Discus how high blood pressure affects the body and ways to reduce high blood pressure.   Exercise and Your Heart -Discuss why it is important to exercise, the FITT principles of exercise, normal and abnormal responses to exercise, and how to exercise safely.   Angina -Discuss definition of angina, causes of angina, treatment of angina, and how to decrease risk of having angina.   Cardiac Medications -Review what the following cardiac medications are used for, how they affect the body, and side effects that may occur when taking the medications.  Medications include Aspirin , Beta blockers, calcium  channel blockers, ACE Inhibitors, angiotensin receptor blockers, diuretics, digoxin, and antihyperlipidemics.   Congestive Heart Failure -Discuss the definition of CHF, how to live with CHF, the signs and symptoms of CHF, and how keep track of weight and sodium intake.   Heart Disease and Intimacy -Discus the effect sexual activity has on the heart, how changes occur  during intimacy as we age, and safety during sexual activity.   Smoking Cessation / COPD -Discuss different methods to quit smoking, the health benefits of quitting smoking, and the definition of COPD.   Nutrition I: Fats -Discuss the types of cholesterol, what cholesterol does to the heart, and how cholesterol levels can be controlled.   Nutrition II: Labels -Discuss the different components of food labels and how to read food label   Heart Parts/Heart Disease and PAD -Discuss the anatomy of the heart, the pathway of blood circulation through the heart, and these are affected by heart disease.   Stress I: Signs and Symptoms -Discuss the causes of stress, how stress may lead to anxiety and depression, and ways to limit stress.   Stress II: Relaxation -Discuss different types of relaxation techniques to limit stress.   Warning Signs of Stroke / TIA -Discuss definition of a stroke, what the signs and symptoms are of a stroke, and how to identify when someone is having stroke.   Knowledge Questionnaire Score:   Core Components/Risk Factors/Patient Goals at Admission:  Personal Goals and Risk Factors at Admission - 11/14/23 1438       Core Components/Risk Factors/Patient Goals on Admission    Weight  Management Yes;Obesity;Weight Loss    Intervention Weight Management: Develop a combined nutrition and exercise program designed to reach desired caloric intake, while maintaining appropriate intake of nutrient and fiber, sodium and fats, and appropriate energy expenditure required for the weight goal.;Weight Management: Provide education and appropriate resources to help participant work on and attain dietary goals.;Weight Management/Obesity: Establish reasonable short term and long term weight goals.;Obesity: Provide education and appropriate resources to help participant work on and attain dietary goals.    Admit Weight 219 lb 1.6 oz (99.4 kg)    Goal Weight: Short Term 214 lb  (97.1 kg)    Goal Weight: Long Term 209 lb (94.8 kg)    Expected Outcomes Short Term: Continue to assess and modify interventions until short term weight is achieved;Long Term: Adherence to nutrition and physical activity/exercise program aimed toward attainment of established weight goal;Weight Loss: Understanding of general recommendations for a balanced deficit meal plan, which promotes 1-2 lb weight loss per week and includes a negative energy balance of [phone] kcal/d;Understanding recommendations for meals to include 15-35% energy as protein, 25-35% energy from fat, 35-60% energy from carbohydrates, less than 200mg  of dietary cholesterol, 20-35 gm of total fiber daily;Understanding of distribution of calorie intake throughout the day with the consumption of 4-5 meals/snacks    Improve shortness of breath with ADL's Yes    Intervention Provide education, individualized exercise plan and daily activity instruction to help decrease symptoms of SOB with activities of daily living.    Expected Outcomes Short Term: Improve cardiorespiratory fitness to achieve a reduction of symptoms when performing ADLs;Long Term: Be able to perform more ADLs without symptoms or delay the onset of symptoms    Intervention Provide education about signs/symptoms and action to take for hypo/hyperglycemia.;Provide education about proper nutrition, including hydration, and aerobic/resistive exercise prescription along with prescribed medications to achieve blood glucose in normal ranges: Fasting glucose 65-99 mg/dL    Expected Outcomes Short Term: Participant verbalizes understanding of the signs/symptoms and immediate care of hyper/hypoglycemia, proper foot care and importance of medication, aerobic/resistive exercise and nutrition plan for blood glucose control.;Long Term: Attainment of HbA1C < 7%.    Heart Failure Yes    Intervention Provide a combined exercise and nutrition program that is supplemented with education,  support and counseling about heart failure. Directed toward relieving symptoms such as shortness of breath, decreased exercise tolerance, and extremity edema.    Expected Outcomes Improve functional capacity of life;Short term: Attendance in program 2-3 days a week with increased exercise capacity. Reported lower sodium intake. Reported increased fruit and vegetable intake. Reports medication compliance.;Short term: Daily weights obtained and reported for increase. Utilizing diuretic protocols set by physician.;Long term: Adoption of self-care skills and reduction of barriers for early signs and symptoms recognition and intervention leading to self-care maintenance.    Hypertension Yes    Intervention Provide education on lifestyle modifcations including regular physical activity/exercise, weight management, moderate sodium restriction and increased consumption of fresh fruit, vegetables, and low fat dairy, alcohol moderation, and smoking cessation.;Monitor prescription use compliance.    Expected Outcomes Short Term: Continued assessment and intervention until BP is < 140/42mm HG in hypertensive participants. < 130/104mm HG in hypertensive participants with diabetes, heart failure or chronic kidney disease.;Long Term: Maintenance of blood pressure at goal levels.    Lipids Yes    Intervention Provide education and support for participant on nutrition & aerobic/resistive exercise along with prescribed medications to achieve LDL 70mg , HDL >40mg .    Expected Outcomes  Short Term: Participant states understanding of desired cholesterol values and is compliant with medications prescribed. Participant is following exercise prescription and nutrition guidelines.;Long Term: Cholesterol controlled with medications as prescribed, with individualized exercise RX and with personalized nutrition plan. Value goals: LDL < 70mg , HDL > 40 mg.          Core Components/Risk Factors/Patient Goals Review:    Core  Components/Risk Factors/Patient Goals at Discharge (Final Review):    ITP Comments:  ITP Comments     Row Name 11/09/23 1033 11/14/23 1432 11/20/23 1059 11/29/23 0834     ITP Comments Virtual orientation visit completed for cardiac rehab with stent placement. On-site orientation visit scheduled for 11/14/23 at 1 pm. Patient attend orientation today.  Patient is attending Cardiac Rehabilitation Program.  Documentation for diagnosis can be found in [facility] 10/17/23 and intervention 10/24/23.  Reviewed medical chart, RPE/RPD, gym safety, and program guidelines.  Patient was fitted to equipment they will be using during rehab.  Patient is scheduled to start exercise on Friday 11/16/23 at 1100.   Initial ITP created and sent for review and signature by Dr. Armida Lander, Medical Director for Cardiac Rehabilitation Program. First full day of exercise!  Patient was oriented to gym and equipment including functions, settings, policies, and procedures.  Patient's individual exercise prescription and treatment plan were reviewed.  All starting workloads were established based on the results of the 6 minute walk test done at initial orientation visit.  The plan for exercise progression was also introduced and progression will be customized based on patient's performance and goals. 30 day review completed. ITP sent to Dr.Jonathan Branch, Medical Director of Cardiac Rehab. Continue with ITP unless changes are made by physician. Patient is new to the program.       Comments: 30 Day Review

## 2023-12-01 ENCOUNTER — Encounter (HOSPITAL_COMMUNITY)

## 2023-12-04 ENCOUNTER — Encounter (HOSPITAL_COMMUNITY)

## 2023-12-06 ENCOUNTER — Encounter (HOSPITAL_COMMUNITY)

## 2023-12-07 ENCOUNTER — Encounter: Payer: Self-pay | Admitting: Internal Medicine

## 2023-12-07 ENCOUNTER — Other Ambulatory Visit (HOSPITAL_COMMUNITY): Payer: Self-pay

## 2023-12-07 ENCOUNTER — Ambulatory Visit: Attending: Internal Medicine | Admitting: Internal Medicine

## 2023-12-07 VITALS — BP 122/60 | HR 100 | Ht 64.0 in | Wt 220.0 lb

## 2023-12-07 DIAGNOSIS — E7849 Other hyperlipidemia: Secondary | ICD-10-CM | POA: Insufficient documentation

## 2023-12-07 DIAGNOSIS — I5022 Chronic systolic (congestive) heart failure: Secondary | ICD-10-CM | POA: Insufficient documentation

## 2023-12-07 MED ORDER — IVABRADINE HCL 5 MG PO TABS: 5.0000 mg | ORAL_TABLET | Freq: Two times a day (BID) | ORAL | 6 refills | Status: DC

## 2023-12-07 MED ORDER — SACUBITRIL-VALSARTAN 24-26 MG PO TABS: 1.0000 | ORAL_TABLET | Freq: Two times a day (BID) | ORAL | 6 refills | Status: DC

## 2023-12-07 MED ORDER — WEGOVY 1 MG/0.5ML ~~LOC~~ SOAJ: 1.0000 mg | SUBCUTANEOUS | 0 refills | Status: DC

## 2023-12-07 MED ORDER — WEGOVY 0.5 MG/0.5ML ~~LOC~~ SOAJ: 0.5000 mg | SUBCUTANEOUS | 0 refills | Status: DC

## 2023-12-07 MED ORDER — WEGOVY 0.25 MG/0.5ML ~~LOC~~ SOAJ: 0.2500 mg | SUBCUTANEOUS | 0 refills | Status: DC

## 2023-12-07 NOTE — Progress Notes (Signed)
 Cardiology Office Note  Date: 12/07/2023   ID: Taylor [name], DOB [birth year], MRN [id number]  PCP:  Maree Isles, MD  Cardiologist:  Lynwood Schilling, MD Electrophysiologist:  None   History of Present Illness: Taylor [name] is a 43 y.o. female  Admitted for nonanginal pain but CT cardiac showed coronary calcium  score of 2837 (99th percentile for age and sex matched control), total plaque volume is extensive and severe diffuse plaque throughout proximal LAD, LCx and RCA leading to downstream flow limitations in all 3 vessels.  She underwent LHC that showed severe multivessel CAD and LVEDP 38 mmHg.  CT surgery was consulted, not a candidate for CABG and hence underwent PCI of mid to distal Lcx. If she continues to have refractory chest pains to medical management, order of PCI would be debulking PCI of LAD crossing diagonal (not involving ostial LAD)--> distal LM into ostial Lcx + provisional PTCA/PCI of ostial LAD and medical management of RCA disease.  No angina or DOE. No dizziness, syncope, palpitations, leg swelling.   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   due to elevated blood sugar 1700-off insulin  6 months   Depression    Diabetes mellitus    off insulin  6 months   Diverticulitis    Dyspnea    when walking   GERD (gastroesophageal reflux disease)    Hypertension    Kidney dialysis status    on dialysis(stroke) off after 4-6 weeks   Pneumonia 2013, 06/2018   Respiratory failure (HCC) 04/2012   with stroke - in hosp [facility] 12 weeks   Seizures (HCC)    during elvated blood sugsr episode 11/13   Sepsis (HCC)    Stroke (HCC)    11/2018, 12/2018 Weak left side, speech- slurred, Short term memeroy, Gait unsteady    Vocal cord dysfunction     Past Surgical History:  Procedure Laterality Date   ABDOMINAL AORTOGRAM W/LOWER EXTREMITY  N/A 06/25/2019   Procedure: ABDOMINAL AORTOGRAM W/LOWER EXTREMITY;  Surgeon: Serene Gaile ORN, MD;  [facility]: [facility];  Service: Cardiovascular;  Laterality: N/A;   ABDOMINAL AORTOGRAM W/LOWER EXTREMITY N/A 10/22/2019   Procedure: ABDOMINAL AORTOGRAM W/LOWER EXTREMITY;  Surgeon: Serene Gaile ORN, MD;  Location: [facility];  Service: Cardiovascular;  Laterality: N/A;   ABDOMINAL AORTOGRAM W/LOWER EXTREMITY Left 04/01/2020   Procedure: ABDOMINAL AORTOGRAM W/LOWER EXTREMITY;  Surgeon: Gretta Lonni PARAS, MD;  Location: [facility];  Service: Cardiovascular;  Laterality: Left;   ABDOMINAL AORTOGRAM W/LOWER EXTREMITY N/A 02/01/2022   Procedure: ABDOMINAL AORTOGRAM W/LOWER EXTREMITY;  Surgeon: Serene Gaile ORN, MD;  Location: [facility];  Service: Cardiovascular;  Laterality: N/A;   AMPUTATION Left 07/03/2019   Procedure: LEFT FOOT 4TH AND 5TH RAY AMPUTATION;  Surgeon: Harden Jerona GAILS, MD;  Location: [facility] OR;  Service: Orthopedics;  Laterality: Left;   APPENDECTOMY     CORONARY STENT INTERVENTION N/A 10/24/2023   Procedure: CORONARY STENT INTERVENTION;  Surgeon: Anner Alm ORN, MD;  Location: [facility];  Service: Cardiovascular;  Laterality: N/A;   FEMORAL-POPLITEAL BYPASS GRAFT Left 02/04/2022   Procedure: LEFT FEMORAL-POPLITEAL ARTERY BYPASS;  Surgeon: Serene Gaile ORN, MD;  [facility]: [facility];  Service: Vascular;  Laterality: Left;   INSERTION OF DIALYSIS CATHETER  11/13   removed in 4-6 weeks   IR  ANGIO INTRA EXTRACRAN SEL COM CAROTID INNOMINATE BILAT MOD SED  02/07/2019   IR ANGIO VERTEBRAL SEL VERTEBRAL BILAT MOD SED  02/07/2019   IR ANGIOGRAM EXTREMITY LEFT  02/07/2019   IR ANGIOGRAM EXTREMITY RIGHT  02/20/2019   IR TRANSCATH EXCRAN VERT OR CAR A STENT  02/20/2019   IR TRANSCATH EXCRAN VERT OR CAR A STENT  03/25/2019   IR US  GUIDE VASC ACCESS RIGHT  02/07/2019   LEFT HEART CATH AND CORONARY ANGIOGRAPHY N/A 10/20/2023   Procedure: LEFT HEART CATH AND CORONARY  ANGIOGRAPHY;  Surgeon: Wendel Lurena POUR, MD;  [facility]: [facility];  Service: Cardiovascular;  Laterality: N/A;   MULTIPLE EXTRACTIONS WITH ALVEOLOPLASTY N/A 02/24/2014   Procedure: MULTIPLE EXTRACTION WITH ALVEOLOPLASTY AND BIOPSY;  Surgeon: Glendia CHRISTELLA Primrose, DDS;  Location: [facility];  Service: Oral Surgery;  Laterality: N/A;   PERIPHERAL VASCULAR ATHERECTOMY Left 06/25/2019   Procedure: PERIPHERAL VASCULAR ATHERECTOMY;  Surgeon: Serene Gaile ORN, MD;  [facility]: [facility];  Service: Cardiovascular;  Laterality: Left;  superficial femoral   PERIPHERAL VASCULAR BALLOON ANGIOPLASTY Left 10/22/2019   Procedure: PERIPHERAL VASCULAR BALLOON ANGIOPLASTY;  Surgeon: Serene Gaile ORN, MD;  [facility]: [facility];  Service: Cardiovascular;  Laterality: Left;  superficial femoral   PERIPHERAL VASCULAR INTERVENTION  04/01/2020   Procedure: PERIPHERAL VASCULAR INTERVENTION;  Surgeon: Gretta Lonni PARAS, MD;  [facility];  Service: Cardiovascular;;  left SFA stent    RADIOLOGY WITH ANESTHESIA N/A 02/20/2019   Procedure: RADIOLOGY WITH ANESTHESIA   STENTING;  Surgeon: Dolphus Carrion, MD;  Location: [facility];  Service: Radiology;  Laterality: N/A;   RADIOLOGY WITH ANESTHESIA N/A 03/25/2019   Procedure: RADIOLOGY WITH ANESTHESIA STENTING;  Surgeon: Dolphus Carrion, MD;  Location: [facility];  Service: Radiology;  Laterality: N/A;   SKIN SPLIT GRAFT Left 07/03/2019   Procedure: APPLY SKIN GRAFT LEFT FOOT;  Surgeon: Harden Jerona GAILS, MD;  Location: [facility] OR;  Service: Orthopedics;  Laterality: Left;   TRACHEOSTOMY  11/13   closed 1/14   TRANSRECTAL DRAINAGE OF PELVIC ABSCESS  12/11   VASCULAR SURGERY  04/01/2020   two stents placed in left leg    Current Outpatient Medications  Medication Sig Dispense Refill   aspirin  81 MG EC tablet Take 81 mg by mouth daily.      atorvastatin  (LIPITOR ) 80 MG tablet TAKE 1 TABLET BY MOUTH DAILY 30 tablet 0   blood glucose meter kit and supplies  Dispense based on patient and insurance preference. Use up to four times daily as directed. (FOR ICD-10 E10.9, E11.9). 1 each 0   clopidogrel  (PLAVIX ) 75 MG tablet Take 75 mg by mouth daily.     cyclobenzaprine (FLEXERIL) 10 MG tablet Take 10 mg by mouth 3 (three) times daily.     dapagliflozin  propanediol (FARXIGA ) 10 MG TABS tablet Take 10 mg by mouth daily.      DULoxetine  (CYMBALTA ) 60 MG capsule Take 60 mg by mouth daily.     ferrous gluconate  (FERGON) 324 MG tablet Take 324 mg by mouth daily.     FLUoxetine  (PROZAC ) 10 MG capsule Take 10 mg by mouth in the morning.     HYDROcodone -acetaminophen  (NORCO/VICODIN) 5-325 MG tablet Take 1 tablet by mouth every 6 (six) hours as needed for severe pain. 20 tablet 0   hydrOXYzine (VISTARIL) 25 MG capsule Take 25 mg by mouth 3 (three) times daily.     levETIRAcetam  (KEPPRA  XR) 500 MG 24 hr tablet Take 2 tablets (  1,000 mg total) by mouth daily. 180 tablet 3   magnesium  oxide (MAG-OX) 400 MG tablet Take 400 mg by mouth daily.     metFORMIN  (GLUCOPHAGE ) 500 MG tablet Take 500 mg by mouth 3 (three) times daily.     metoprolol  succinate (TOPROL -XL) 25 MG 24 hr tablet Take 0.5 tablets (12.5 mg total) by mouth daily. 45 tablet 0   nitroGLYCERIN  (NITROSTAT ) 0.4 MG SL tablet Place 1 tablet (0.4 mg total) under the tongue every 5 (five) minutes as needed for chest pain. 90 tablet 3   pantoprazole  (PROTONIX ) 40 MG tablet Take 1 tablet (40 mg total) by mouth daily. 30 tablet 0   pregabalin  (LYRICA ) 150 MG capsule TAKE 1 CAPSULE BY MOUTH THREE TIMES DAILY 90 capsule 5   ranolazine  (RANEXA ) 500 MG 12 hr tablet Take 1 tablet (500 mg total) by mouth 2 (two) times daily. 180 tablet 0   risperiDONE  (RISPERDAL ) 0.5 MG tablet Take 0.5 mg by mouth at bedtime.     ticagrelor  (BRILINTA ) 90 MG TABS tablet Take 1 tablet (90 mg total) by mouth 2 (two) times daily. 180 tablet 0   tiZANidine  (ZANAFLEX ) 2 MG tablet TAKE 1 TABLET BY MOUTH AT BEDTIME AS NEEDED FOR MUSCLE SPASMS 30  tablet 2   traMADol  (ULTRAM ) 50 MG tablet Take 50 mg by mouth 4 (four) times daily.     traZODone  (DESYREL ) 100 MG tablet Take 200 mg by mouth at bedtime.      diazepam  (VALIUM ) 5 MG tablet Take 5 mg by mouth 2 (two) times daily.  (Patient not taking: Reported on 12/07/2023)     No current facility-administered medications for this visit.   Allergies:  Amitriptyline, Contrast media [iodinated contrast media], Morphine  and codeine, Promethazine , Compazine, Prochlorperazine maleate, Adhesive [tape], Oxycodone , and Sumatriptan   Social History: The patient  reports that she quit smoking about 4 years ago. Her smoking use included cigarettes. She started smoking about 27 years ago. She has a 13.8 pack-year smoking history. She has never been exposed to tobacco smoke. She has never used smokeless tobacco. She reports that she does not drink alcohol and does not use drugs.   Family History: The patient's family history includes Diabetes in her paternal grandfather; Heart attack in her maternal grandfather; Lung cancer in her maternal grandmother.   ROS:  Please see the history of present illness. Otherwise, complete review of systems is positive for none  All other systems are reviewed and negative.   Physical Exam: VS:  BP 122/60   Pulse 100   Ht 5' 4 (1.626 m)   Wt 220 lb (99.8 kg)   SpO2 96%   BMI 37.76 kg/m , BMI Body mass index is 37.76 kg/m.  Wt Readings from Last 3 Encounters:  12/07/23 220 lb (99.8 kg)  11/14/23 219 lb 1.6 oz (99.4 kg)  11/03/23 219 lb 3.2 oz (99.4 kg)    General: Patient appears comfortable at rest. HEENT: Conjunctiva and lids normal, oropharynx clear with moist mucosa. Neck: Supple, no elevated JVP or carotid bruits, no thyromegaly. Lungs: Clear to auscultation, nonlabored breathing at rest. Cardiac: Regular rate and rhythm, no S3 or significant systolic murmur, no pericardial rub. Abdomen: Soft, nontender, no hepatomegaly, bowel sounds present, no guarding or  rebound. Extremities: No pitting edema, distal pulses 2+. Skin: Warm and dry. Musculoskeletal: No kyphosis. Neuropsychiatric: Alert and oriented x3, affect grossly appropriate.  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  She underwent LHC that showed severe multivessel CAD and LVEDP 38 mmHg.  CT surgery was consulted, not a candidate for CABG and hence underwent PCI of mid to distal Lcx. If she continues to have refractory chest pains to medical management, order of PCI would be debulking PCI of LAD crossing diagonal (not involving ostial LAD)--> distal LM into ostial Lcx + provisional PTCA/PCI of ostial LAD and medical management of RCA disease.  No interval angina. Continue DAPT, aspirin  81 mg once daily and Brillinta 90 mg BID. No SOB with Brillinta. Continue atorvastatin  80 mg at bedtime.  HLD, at goal: Continue atorvastatin  80 mg at bedtime. Goal LDL<55.LDL 47 in 10/2023.  Chronic systolic HF, compensated: Continue metoprolol  succinate 12.5 mg once daily and start Entresto  24-26 mg BID. Will uptitrate GDMT in 6 weeks.  Sinus tachycardia: Elevated resting HR. Continue BB. Start Ivabradine  5 mg BID.  Morbid obesity and CAD: Initially Wegovy  was started but later switched to Ozempic  due  to DM2.   Specialist notes, echo, LHC reviewed.   Medication Adjustments/Labs and Tests Ordered: Current medicines are reviewed at length with the patient today.  Concerns regarding medicines are outlined above.    Disposition:  Follow up 6 weeks for uptitration GDMT  Signed Nekeya Briski Arleta Maywood, MD, 12/07/2023 3:50 PM    [facility] [address] [place], [place], [place] [address]

## 2023-12-07 NOTE — Patient Instructions (Addendum)
 Medication Instructions:   Should not be on Plavix  - should only be taking the Brilinta  and Aspirin .   Please have mom or dad call us  back medications after checking at home.  Begin Entresto  24-26mg  twice a day   Begin Ivabradine  5mg  twice a day   Wegovy 0.25mg  x 1 month, then increase to 0.5mg  for the 2nd month and then increase to 1mg  for the 3rd month  Continue all other medications.     Labwork:  none  Testing/Procedures:  none  Follow-Up:  6 weeks   Any Other Special Instructions Will Be Listed Below (If Applicable).   If you need a refill on your cardiac medications before your next appointment, please call your pharmacy.

## 2023-12-08 ENCOUNTER — Telehealth: Payer: Self-pay | Admitting: Internal Medicine

## 2023-12-08 ENCOUNTER — Encounter (HOSPITAL_COMMUNITY): Payer: Self-pay | Admitting: Interventional Radiology

## 2023-12-08 ENCOUNTER — Encounter (HOSPITAL_COMMUNITY)

## 2023-12-08 NOTE — Telephone Encounter (Signed)
*  STAT* If patient is at the pharmacy, call can be transferred to refill team.   1. Which medications need to be refilled? (please list name of each medication and dose if known) diazepam  (VALIUM ) 5 MG tablet  HYDROcodone -acetaminophen  (NORCO/VICODIN) 5-325 MG tablet    2. Would you like to learn more about the convenience, safety, & potential cost savings by using the [facility]?     3. Are you open to using the [facility] (Type [facility].  ).   4. Which pharmacy/location (including street and city if local pharmacy) is medication to be sent to? [facility] - [place], [place] - [address]    5. Do they need a 30 day or 90 day supply? 90 day

## 2023-12-08 NOTE — Telephone Encounter (Signed)
 Spoke with mom Giles Sar) - states that she did not find the Plavix  in her bubble pack.  Removed from list today as she is not supposed to be on this.  Also, informed mom that we can not fill the Hydrocodone  or Diazepam  for her - pcp will need to address this.  She states that she is no longer seeing Dr. Maree & is looking for new pcp.  Informed her that number for [facility] info sheet was given to her yesterday in office - she may call that number for help locating new pcp.  Mom verbalized understanding.

## 2023-12-08 NOTE — Telephone Encounter (Signed)
 Pt calling requesting a refill on medication diazepam  and hydrocodone . I explained to the pt that they needed to contact her PCP for a refill on these non cardiac medication. At pt's office visit yesterday, Dr. Mallipeddi stated that pt should not be taking clopidogrel . This medication is still on pt's medication list. Please address

## 2023-12-11 ENCOUNTER — Encounter (HOSPITAL_COMMUNITY)

## 2023-12-11 ENCOUNTER — Telehealth (HOSPITAL_COMMUNITY): Payer: Self-pay

## 2023-12-13 ENCOUNTER — Encounter (HOSPITAL_COMMUNITY)

## 2023-12-13 ENCOUNTER — Telehealth: Payer: Self-pay | Admitting: Pharmacist Clinician (PhC)/ Clinical Pharmacy Specialist

## 2023-12-13 DIAGNOSIS — E1100 Type 2 diabetes mellitus with hyperosmolarity without nonketotic hyperglycemic-hyperosmolar coma (NKHHC): Secondary | ICD-10-CM

## 2023-12-13 NOTE — Telephone Encounter (Signed)
 Dr Mallipeddi  We got a prior auth request for the Wegovy  that you ordered for Ms. Blanford.  Because she is diabetic, we can prescribe Ozempic (same medication) and get it covered without issues.    You can have your nurse switch the prescription and notify the patient/family of the different name.   Thanks,  Quaneisha Hanisch

## 2023-12-17 DIAGNOSIS — I5022 Chronic systolic (congestive) heart failure: Secondary | ICD-10-CM | POA: Insufficient documentation

## 2023-12-17 DIAGNOSIS — E785 Hyperlipidemia, unspecified: Secondary | ICD-10-CM | POA: Insufficient documentation

## 2023-12-19 MED ORDER — SEMAGLUTIDE(0.25 OR 0.5MG/DOS) 2 MG/3ML ~~LOC~~ SOPN: 0.2500 mg | PEN_INJECTOR | SUBCUTANEOUS | 0 refills | Status: DC

## 2023-12-19 MED ORDER — SEMAGLUTIDE(0.25 OR 0.5MG/DOS) 2 MG/3ML ~~LOC~~ SOPN: 0.2500 mg | PEN_INJECTOR | SUBCUTANEOUS | 0 refills | Status: AC

## 2023-12-19 NOTE — Addendum Note (Signed)
 Addended by: Veyda Kaufman D on: 12/19/2023 11:51 AM   Modules accepted: Orders

## 2023-12-19 NOTE — Telephone Encounter (Signed)
 Rx changed and sent to [facility]

## 2023-12-19 NOTE — Addendum Note (Signed)
 Addended by: Jerell Demery on: 12/19/2023 07:45 AM   Modules accepted: Orders

## 2023-12-19 NOTE — Telephone Encounter (Signed)
 Rosina from [facility] is requesting a callback [phone] regarding them needing the diagnosis code for the insurance for the ozempic  medication. Please advise

## 2023-12-19 NOTE — Telephone Encounter (Signed)
 E11.9 Diabetes Type II is the diagnosis code. I will send new Rx with dx code.

## 2023-12-25 ENCOUNTER — Encounter (HOSPITAL_COMMUNITY)

## 2023-12-25 ENCOUNTER — Telehealth (HOSPITAL_COMMUNITY): Payer: Self-pay

## 2023-12-25 NOTE — Telephone Encounter (Signed)
 Called pt about missing class 7/14. She said that her back has been hurting her since coming back from vacation. She is having pain in her lower back to her legs. She asked to take the week off and return next week.

## 2023-12-26 ENCOUNTER — Ambulatory Visit: Admitting: Internal Medicine

## 2023-12-27 ENCOUNTER — Encounter (HOSPITAL_COMMUNITY)

## 2023-12-27 ENCOUNTER — Encounter (HOSPITAL_COMMUNITY): Payer: Self-pay | Admitting: *Deleted

## 2023-12-27 DIAGNOSIS — Z955 Presence of coronary angioplasty implant and graft: Secondary | ICD-10-CM

## 2023-12-27 NOTE — Progress Notes (Signed)
 Cardiac Individual Treatment Plan  Patient Details  Name: Taylor [name] MRN: [id number] Date of Birth: 06/06/1981 Referring Provider:   Flowsheet Row CARDIAC REHAB PHASE II ORIENTATION from 11/14/2023 in [facility] CARDIAC REHABILITATION  Referring Provider Lavona Agent MD    Initial Encounter Date:  Flowsheet Row CARDIAC REHAB PHASE II ORIENTATION from 11/14/2023 in [facility]  Date 11/14/23    Visit Diagnosis: Status post coronary artery stent placement  Patient's Home Medications on Admission:  Current Outpatient Medications:    aspirin  81 MG EC tablet, Take 81 mg by mouth daily. , Disp: , Rfl:    atorvastatin  (LIPITOR ) 80 MG tablet, TAKE 1 TABLET BY MOUTH DAILY, Disp: 30 tablet, Rfl: 0   blood glucose meter kit and supplies, Dispense based on patient and insurance preference. Use up to four times daily as directed. (FOR ICD-10 E10.9, E11.9)., Disp: 1 each, Rfl: 0   cyclobenzaprine (FLEXERIL) 10 MG tablet, Take 10 mg by mouth 3 (three) times daily., Disp: , Rfl:    dapagliflozin  propanediol (FARXIGA ) 10 MG TABS tablet, Take 10 mg by mouth daily. , Disp: , Rfl:    diazepam  (VALIUM ) 5 MG tablet, Take 5 mg by mouth 2 (two) times daily.  (Patient not taking: Reported on 12/07/2023), Disp: , Rfl:    DULoxetine  (CYMBALTA ) 60 MG capsule, Take 60 mg by mouth daily., Disp: , Rfl:    ferrous gluconate  (FERGON) 324 MG tablet, Take 324 mg by mouth daily., Disp: , Rfl:    FLUoxetine  (PROZAC ) 10 MG capsule, Take 10 mg by mouth in the morning., Disp: , Rfl:    HYDROcodone -acetaminophen  (NORCO/VICODIN) 5-325 MG tablet, Take 1 tablet by mouth every 6 (six) hours as needed for severe pain., Disp: 20 tablet, Rfl: 0   hydrOXYzine (VISTARIL) 25 MG capsule, Take 25 mg by mouth 3 (three) times daily., Disp: , Rfl:    ivabradine  (CORLANOR ) 5 MG TABS tablet, Take 1 tablet (5 mg total) by mouth 2 (two) times daily., Disp: 60 tablet, Rfl: 6   levETIRAcetam  (KEPPRA  XR) 500 MG 24 hr tablet,  Take 2 tablets (1,000 mg total) by mouth daily., Disp: 180 tablet, Rfl: 3   magnesium  oxide (MAG-OX) 400 MG tablet, Take 400 mg by mouth daily., Disp: , Rfl:    metFORMIN  (GLUCOPHAGE ) 500 MG tablet, Take 500 mg by mouth 3 (three) times daily., Disp: , Rfl:    metoprolol  succinate (TOPROL -XL) 25 MG 24 hr tablet, Take 0.5 tablets (12.5 mg total) by mouth daily., Disp: 45 tablet, Rfl: 0   nitroGLYCERIN  (NITROSTAT ) 0.4 MG SL tablet, Place 1 tablet (0.4 mg total) under the tongue every 5 (five) minutes as needed for chest pain., Disp: 90 tablet, Rfl: 3   pantoprazole  (PROTONIX ) 40 MG tablet, Take 1 tablet (40 mg total) by mouth daily., Disp: 30 tablet, Rfl: 0   pregabalin  (LYRICA ) 150 MG capsule, TAKE 1 CAPSULE BY MOUTH THREE TIMES DAILY, Disp: 90 capsule, Rfl: 5   ranolazine  (RANEXA ) 500 MG 12 hr tablet, Take 1 tablet (500 mg total) by mouth 2 (two) times daily., Disp: 180 tablet, Rfl: 0   risperiDONE  (RISPERDAL ) 0.5 MG tablet, Take 0.5 mg by mouth at bedtime., Disp: , Rfl:    sacubitril -valsartan  (ENTRESTO ) 24-26 MG, Take 1 tablet by mouth 2 (two) times daily., Disp: 60 tablet, Rfl: 6   Semaglutide ,0.25 or 0.5MG /DOS, 2 MG/3ML SOPN, Inject 0.25 mg into the skin once a week for 28 days., Disp: 3 mL, Rfl: 0   ticagrelor  (BRILINTA )  90 MG TABS tablet, Take 1 tablet (90 mg total) by mouth 2 (two) times daily., Disp: 180 tablet, Rfl: 0   tiZANidine  (ZANAFLEX ) 2 MG tablet, TAKE 1 TABLET BY MOUTH AT BEDTIME AS NEEDED FOR MUSCLE SPASMS, Disp: 30 tablet, Rfl: 2   traMADol  (ULTRAM ) 50 MG tablet, Take 50 mg by mouth 4 (four) times daily., Disp: , Rfl:    traZODone  (DESYREL ) 100 MG tablet, Take 200 mg by mouth at bedtime. 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   due to elevated blood sugar 1700-off insulin  6 months   Depression     Diabetes mellitus    off insulin  6 months   Diverticulitis    Dyspnea    when walking   GERD (gastroesophageal reflux disease)    Hypertension    Kidney dialysis status    on dialysis(stroke) off after 4-6 weeks   Pneumonia 2013, 06/2018   Respiratory failure (HCC) 04/2012   with stroke - in hosp [facility] 12 weeks   Seizures (HCC)    during elvated blood sugsr episode 11/13   Sepsis (HCC)    Stroke (HCC)    11/2018, 12/2018 Weak left side, speech- slurred, Short term memeroy, Gait unsteady    Vocal cord dysfunction     Tobacco Use: Social History   Tobacco Use  Smoking Status Former   Current packs/day: 0.00   Average packs/day: 0.6 packs/day for 23.0 years (13.8 ttl pk-yrs)   Types: Cigarettes   Start date: 02/05/1996   Quit date: 02/05/2019   Years since quitting: 4.8   Passive exposure: Never  Smokeless Tobacco Never    Labs: Review Flowsheet  More data exists      Latest Ref Rng & Units 02/01/2022 02/04/2022 02/05/2022 10/17/2023 10/18/2023  Labs for ITP Cardiac and Pulmonary Rehab  Cholestrol 0 - 200 mg/dL - - 880  - 863   LDL (calc) 0 - 99 mg/dL - - 28  - 47   HDL-C >59 mg/dL - - 39  - 59   Trlycerides <150 mg/dL - - 738  - 849   Hemoglobin A1c 4.8 - 5.6 % - 6.9  - 8.0  -  TCO2 22 - 32 mmol/L 26  - - - -    Capillary Blood Glucose: Lab Results  Component Value Date   GLUCAP 212 (H) 11/29/2023   GLUCAP 193 (H) 11/22/2023   GLUCAP 253 (H) 11/22/2023   GLUCAP 154 (H) 11/20/2023   GLUCAP 176 (H) 11/20/2023     Exercise Target Goals: Exercise Program Goal: Individual exercise prescription set using results from initial 6 min walk test and THRR while considering  patient's activity barriers and safety.   Exercise Prescription Goal: Starting with aerobic activity 30 plus minutes a day, 3 days per week for initial exercise prescription. Provide home exercise prescription and guidelines that participant acknowledges understanding prior to discharge.  Activity Barriers  & Risk Stratification:  Activity Barriers & Cardiac Risk Stratification - 11/14/23 1305       Activity Barriers & Cardiac Risk Stratification   Activity Barriers Deconditioning;Balance Concerns;Back Problems;Shortness of Breath;Chest Pain/Angina;Decreased Ventricular Function;Other (comment);Muscular Weakness    Comments Hx three strokes with residuals (limp), not candidate for bypass, no chest pain    Cardiac Risk Stratification High  6 Minute Walk:  6 Minute Walk     Row Name 11/14/23 1433         6 Minute Walk   Phase Initial     Distance 600 feet     Walk Time 3.75 minutes     # of Rest Breaks 1  1:10 seat rest, stopped at 4:55     MPH 1.82     METS 2.83     RPE 17     Perceived Dyspnea  3     VO2 Peak 9.92     Symptoms Yes (comment)     Comments SOB, legs tingling, chest pain 6/10     Resting HR 94 bpm     Resting BP 124/62     Resting Oxygen Saturation  97 %     Exercise Oxygen Saturation  during 6 min walk 97 %     Max Ex. HR 129 bpm     Max Ex. BP 146/72     2 Minute Post BP 124/66        Oxygen Initial Assessment:   Oxygen Re-Evaluation:   Oxygen Discharge (Final Oxygen Re-Evaluation):   Initial Exercise Prescription:  Initial Exercise Prescription - 11/14/23 1400       Date of Initial Exercise RX and Referring Provider   Date 11/14/23    Referring Provider Lavona Agent MD    Expected Discharge Date 02/02/24   Medicaid     Oxygen   Maintain Oxygen Saturation 88% or higher      NuStep   Level 1    SPM 80    Minutes 15    METs 2      REL-XR   Level 1    Speed 50    Minutes 15    METs 2      Prescription Details   Frequency (times per week) 3    Duration Progress to 30 minutes of continuous aerobic without signs/symptoms of physical distress      Intensity   THRR 40-80% of Max Heartrate 127-160    Ratings of Perceived Exertion 11-13    Perceived Dyspnea 0-4      Progression   Progression Continue to progress  workloads to maintain intensity without signs/symptoms of physical distress.      Resistance Training   Training Prescription Yes    Weight 3 lb    Reps 10-15          Perform Capillary Blood Glucose checks as needed.  Exercise Prescription Changes:   Exercise Prescription Changes     Row Name 11/14/23 1400             Response to Exercise   Blood Pressure (Admit) 124/62       Blood Pressure (Exercise) 146/72       Blood Pressure (Exit) 124/66       Heart Rate (Admit) 94 bpm       Heart Rate (Exercise) 129 bpm       Heart Rate (Exit) 97 bpm       Oxygen Saturation (Admit) 97 %       Oxygen Saturation (Exercise) 97 %       Oxygen Saturation (Exit) 96 %       Rating of Perceived Exertion (Exercise) 17       Perceived Dyspnea (Exercise) 3       Symptoms SOB, legs tingling, chest pain (6/10)       Comments walk test results  Exercise Comments:   Exercise Comments     Row Name 11/20/23 1059           Exercise Comments First full day of exercise!  Patient was oriented to gym and equipment including functions, settings, policies, and procedures.  Patient's individual exercise prescription and treatment plan were reviewed.  All starting workloads were established based on the results of the 6 minute walk test done at initial orientation visit.  The plan for exercise progression was also introduced and progression will be customized based on patient's performance and goals.          Exercise Goals and Review:   Exercise Goals     Row Name 11/14/23 1436             Exercise Goals   Increase Physical Activity Yes       Intervention Provide advice, education, support and counseling about physical activity/exercise needs.;Develop an individualized exercise prescription for aerobic and resistive training based on initial evaluation findings, risk stratification, comorbidities and participant's personal goals.       Expected Outcomes Short Term: Attend rehab  on a regular basis to increase amount of physical activity.;Long Term: Add in home exercise to make exercise part of routine and to increase amount of physical activity.;Long Term: Exercising regularly at least 3-5 days a week.       Increase Strength and Stamina Yes       Intervention Provide advice, education, support and counseling about physical activity/exercise needs.;Develop an individualized exercise prescription for aerobic and resistive training based on initial evaluation findings, risk stratification, comorbidities and participant's personal goals.       Expected Outcomes Short Term: Increase workloads from initial exercise prescription for resistance, speed, and METs.;Short Term: Perform resistance training exercises routinely during rehab and add in resistance training at home;Long Term: Improve cardiorespiratory fitness, muscular endurance and strength as measured by increased METs and functional capacity ( )       Able to understand and use rate of perceived exertion (RPE) scale Yes       Intervention Provide education and explanation on how to use RPE scale       Expected Outcomes Short Term: Able to use RPE daily in rehab to express subjective intensity level;Long Term:  Able to use RPE to guide intensity level when exercising independently       Able to understand and use Dyspnea scale Yes       Intervention Provide education and explanation on how to use Dyspnea scale       Expected Outcomes Long Term: Able to use Dyspnea scale to guide intensity level when exercising independently;Short Term: Able to use Dyspnea scale daily in rehab to express subjective sense of shortness of breath during exertion       Knowledge and understanding of Target Heart Rate Range (THRR) Yes       Intervention Provide education and explanation of THRR including how the numbers were predicted and where they are located for reference       Expected Outcomes Short Term: Able to state/look up THRR;Long  Term: Able to use THRR to govern intensity when exercising independently;Short Term: Able to use daily as guideline for intensity in rehab       Able to check pulse independently Yes       Intervention Provide education and demonstration on how to check pulse in carotid and radial arteries.;Review the importance of being able to check your own pulse for safety during independent  exercise       Expected Outcomes Short Term: Able to explain why pulse checking is important during independent exercise;Long Term: Able to check pulse independently and accurately       Understanding of Exercise Prescription Yes       Intervention Provide education, explanation, and written materials on patient's individual exercise prescription       Expected Outcomes Short Term: Able to explain program exercise prescription;Long Term: Able to explain home exercise prescription to exercise independently          Exercise Goals Re-Evaluation :  Exercise Goals Re-Evaluation     Row Name 11/20/23 1059             Exercise Goal Re-Evaluation   Exercise Goals Review Able to understand and use rate of perceived exertion (RPE) scale;Able to understand and use Dyspnea scale;Understanding of Exercise Prescription       Comments Reviewed RPE and dyspnea scale, THR and program prescription with pt today.  Pt voiced understanding and was given a copy of goals to take home.       Expected Outcomes Short: Use RPE daily to regulate intensity.  Long: Follow program prescription in THR.           Discharge Exercise Prescription (Final Exercise Prescription Changes):  Exercise Prescription Changes - 11/14/23 1400       Response to Exercise   Blood Pressure (Admit) 124/62    Blood Pressure (Exercise) 146/72    Blood Pressure (Exit) 124/66    Heart Rate (Admit) 94 bpm    Heart Rate (Exercise) 129 bpm    Heart Rate (Exit) 97 bpm    Oxygen Saturation (Admit) 97 %    Oxygen Saturation (Exercise) 97 %    Oxygen Saturation  (Exit) 96 %    Rating of Perceived Exertion (Exercise) 17    Perceived Dyspnea (Exercise) 3    Symptoms SOB, legs tingling, chest pain (6/10)    Comments walk test results          Nutrition:  Target Goals: Understanding of nutrition guidelines, daily intake of sodium 1500mg , cholesterol 200mg , calories 30% from fat and 7% or less from saturated fats, daily to have 5 or more servings of fruits and vegetables.  Biometrics:  Pre Biometrics - 11/14/23 1437       Pre Biometrics   Height 5' 2.5 (1.588 m)    Weight 219 lb 1.6 oz (99.4 kg)    Waist Circumference 47 inches    Hip Circumference 44 inches    Waist to Hip Ratio 1.07 %    BMI (Calculated) 39.41    Grip Strength 26.5 kg    Single Leg Stand 3.4 seconds           Nutrition Therapy Plan and Nutrition Goals:  Nutrition Therapy & Goals - 11/14/23 1437       Intervention Plan   Intervention Prescribe, educate and counsel regarding individualized specific dietary modifications aiming towards targeted core components such as weight, hypertension, lipid management, diabetes, heart failure and other comorbidities.;Nutrition handout(s) given to patient.    Expected Outcomes Short Term Goal: Understand basic principles of dietary content, such as calories, fat, sodium, cholesterol and nutrients.;Long Term Goal: Adherence to prescribed nutrition plan.          Nutrition Assessments:  MEDIFICTS Score Key: >=70 Need to make dietary changes  40-70 Heart Healthy Diet <= 40 Therapeutic Level Cholesterol Diet  Flowsheet Row CARDIAC REHAB PHASE II ORIENTATION from 11/14/2023 in  [facility] CARDIAC RE[facility]ILITATION  Picture Your Plate Total Score on Admission 54   Picture Your Plate Scores: <59 Unhealthy dietary pattern with much room for improvement. 41-50 Dietary pattern unlikely to meet recommendations for good health and room for improvement. 51-60 More healthful dietary pattern, with some room for improvement.  >60 Healthy  dietary pattern, although there may be some specific behaviors that could be improved.    Nutrition Goals Re-Evaluation:   Nutrition Goals Discharge (Final Nutrition Goals Re-Evaluation):   Psychosocial: Target Goals: Acknowledge presence or absence of significant depression and/or stress, maximize coping skills, provide positive support system. Participant is able to verbalize types and ability to use techniques and skills needed for reducing stress and depression.  Initial Review & Psychosocial Screening:  Initial Psych Review & Screening - 11/09/23 1004       Initial Review   Current issues with History of Depression;Current Anxiety/Panic;Current Psychotropic Meds;Current Depression      Family Dynamics   Good Support System? Yes      Barriers   Psychosocial barriers to participate in program The patient should benefit from training in stress management and relaxation.;There are no identifiable barriers or psychosocial needs.      Screening Interventions   Interventions Encouraged to exercise;To provide support and resources with identified psychosocial needs;Provide feedback about the scores to participant    Expected Outcomes Short Term goal: Utilizing psychosocial counselor, staff and physician to assist with identification of specific Stressors or current issues interfering with healing process. Setting desired goal for each stressor or current issue identified.;Long Term Goal: Stressors or current issues are controlled or eliminated.;Short Term goal: Identification and review with participant of any Quality of Life or Depression concerns found by scoring the questionnaire.;Long Term goal: The participant improves quality of Life and PHQ9 Scores as seen by post scores and/or verbalization of changes          Quality of Life Scores:  Scores of 19 and below usually indicate a poorer quality of life in these areas.  A difference of  2-3 points is a clinically meaningful  difference.  A difference of 2-3 points in the total score of the Quality of Life Index has been associated with significant improvement in overall quality of life, self-image, physical symptoms, and general health in studies assessing change in quality of life.  PHQ-9: Review Flowsheet       11/14/2023  Depression screen PHQ 2/9  Decreased Interest 1  Down, Depressed, Hopeless 1  PHQ - 2 Score 2  Altered sleeping 1  Tired, decreased energy 1  Change in appetite 1  Feeling bad or failure about yourself  1  Trouble concentrating 0  Moving slowly or fidgety/restless 1  Suicidal thoughts 0  PHQ-9 Score 7  Difficult doing work/chores Somewhat difficult   Interpretation of Total Score  Total Score Depression Severity:  1-4 = Minimal depression, 5-9 = Mild depression, 10-14 = Moderate depression, 15-19 = Moderately severe depression, 20-27 = Severe depression   Psychosocial Evaluation and Intervention:  Psychosocial Evaluation - 11/09/23 1006       Psychosocial Evaluation & Interventions   Interventions Stress management education;Relaxation education;Encouraged to exercise with the program and follow exercise prescription    Comments Patient was referred to [facility] with Stent placement. Her mother answered all questions. Patient has a long history of depression and anxiety and seizure disorder. She lives with her parents who support her along with her brother. Her mother is her legal guardian. She is  seeing a psychiatrist routinely but her mother says they are currently looking for another therapist. She is on diazepam , prozac , risperdal  and Trazodone  for management of her psychosocial issure. She sleeps well at night. Her mother says her depression has worsened since Januray 2025 when she developed chronic back pain. She has been evaulated but no cause has been found. She says she is not motivated to do a lot of activities due to the pain. 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 Her goals for the program are to lose weight, improve her SOB and get more active. Her mother has done cardiac rehab here and she is very motivated for her daughter to participate; however, her chronic pain and depression could be a barrier for her to particiapte consistently.    Expected Outcomes Short Term: Patient will start the program and attend consistently. Long Term: Patient will complete the program meeting personal goals.    Continue Psychosocial Services  Follow up required by staff          Psychosocial Re-Evaluation:   Psychosocial Discharge (Final Psychosocial Re-Evaluation):   Vocational Rehabilitation: Provide vocational rehab assistance to qualifying candidates.   Vocational Rehab Evaluation & Intervention:  Vocational Rehab - 11/09/23 1003       Initial Vocational Rehab Evaluation & Intervention   Assessment shows need for Vocational Rehabilitation No      Vocational Rehab Re-Evaulation   Comments Patient is disabled.          Education: Education Goals: Education classes will be provided on a weekly basis, covering required topics. Participant will state understanding/return demonstration of topics presented.  Learning Barriers/Preferences:  Learning Barriers/Preferences - 11/09/23 1003       Learning Barriers/Preferences   Learning Barriers None    Learning Preferences Written Material;Audio;Skilled Demonstration          Education Topics: Hypertension, Hypertension Reduction -Define heart disease and high blood pressure. Discus how high blood pressure affects the body and ways to reduce high blood pressure.   Exercise and Your Heart -Discuss why it is important to exercise, the FITT principles of exercise, normal and abnormal responses to exercise, and how to exercise safely. Flowsheet Row CARDIAC REHAB PHASE II EXERCISE from 11/29/2023 in [facility]  Date 11/29/23  Educator HB  Instruction  Review Code 1- Verbalizes Understanding    Angina -Discuss definition of angina, causes of angina, treatment of angina, and how to decrease risk of having angina.   Cardiac Medications -Review what the following cardiac medications are used for, how they affect the body, and side effects that may occur when taking the medications.  Medications include Aspirin , Beta blockers, calcium  channel blockers, ACE Inhibitors, angiotensin receptor blockers, diuretics, digoxin, and antihyperlipidemics.   Congestive Heart Failure -Discuss the definition of CHF, how to live with CHF, the signs and symptoms of CHF, and how keep track of weight and sodium intake.   Heart Disease and Intimacy -Discus the effect sexual activity has on the heart, how changes occur during intimacy as we age, and safety during sexual activity.   Smoking Cessation / COPD -Discuss different methods to quit smoking, the health benefits of quitting smoking, and the definition of COPD.   Nutrition I: Fats -Discuss the types of cholesterol, what cholesterol does to the heart, and how cholesterol levels can be controlled.   Nutrition II: Labels -Discuss the different components of food labels and how to read food label  Heart Parts/Heart Disease and PAD -Discuss the anatomy of the heart, the pathway of blood circulation through the heart, and these are affected by heart disease.   Stress I: Signs and Symptoms -Discuss the causes of stress, how stress may lead to anxiety and depression, and ways to limit stress.   Stress II: Relaxation -Discuss different types of relaxation techniques to limit stress.   Warning Signs of Stroke / TIA -Discuss definition of a stroke, what the signs and symptoms are of a stroke, and how to identify when someone is having stroke.   Knowledge Questionnaire Score:   Core Components/Risk Factors/Patient Goals at Admission:  Personal Goals and Risk Factors at Admission - 11/14/23 1438        Core Components/Risk Factors/Patient Goals on Admission    Weight Management Yes;Obesity;Weight Loss    Intervention Weight Management: Develop a combined nutrition and exercise program designed to reach desired caloric intake, while maintaining appropriate intake of nutrient and fiber, sodium and fats, and appropriate energy expenditure required for the weight goal.;Weight Management: Provide education and appropriate resources to help participant work on and attain dietary goals.;Weight Management/Obesity: Establish reasonable short term and long term weight goals.;Obesity: Provide education and appropriate resources to help participant work on and attain dietary goals.    Admit Weight 219 lb 1.6 oz (99.4 kg)    Goal Weight: Short Term 214 lb (97.1 kg)    Goal Weight: Long Term 209 lb (94.8 kg)    Expected Outcomes Short Term: Continue to assess and modify interventions until short term weight is achieved;Long Term: Adherence to nutrition and physical activity/exercise program aimed toward attainment of established weight goal;Weight Loss: Understanding of general recommendations for a balanced deficit meal plan, which promotes 1-2 lb weight loss per week and includes a negative energy balance of [phone] kcal/d;Understanding recommendations for meals to include 15-35% energy as protein, 25-35% energy from fat, 35-60% energy from carbohydrates, less than 200mg  of dietary cholesterol, 20-35 gm of total fiber daily;Understanding of distribution of calorie intake throughout the day with the consumption of 4-5 meals/snacks    Improve shortness of breath with ADL's Yes    Intervention Provide education, individualized exercise plan and daily activity instruction to help decrease symptoms of SOB with activities of daily living.    Expected Outcomes Short Term: Improve cardiorespiratory fitness to achieve a reduction of symptoms when performing ADLs;Long Term: Be able to perform more ADLs without  symptoms or delay the onset of symptoms    Intervention Provide education about signs/symptoms and action to take for hypo/hyperglycemia.;Provide education about proper nutrition, including hydration, and aerobic/resistive exercise prescription along with prescribed medications to achieve blood glucose in normal ranges: Fasting glucose 65-99 mg/dL    Expected Outcomes Short Term: Participant verbalizes understanding of the signs/symptoms and immediate care of hyper/hypoglycemia, proper foot care and importance of medication, aerobic/resistive exercise and nutrition plan for blood glucose control.;Long Term: Attainment of HbA1C < 7%.    Heart Failure Yes    Intervention Provide a combined exercise and nutrition program that is supplemented with education, support and counseling about heart failure. Directed toward relieving symptoms such as shortness of breath, decreased exercise tolerance, and extremity edema.    Expected Outcomes Improve functional capacity of life;Short term: Attendance in program 2-3 days a week with increased exercise capacity. Reported lower sodium intake. Reported increased fruit and vegetable intake. Reports medication compliance.;Short term: Daily weights obtained and reported for increase. Utilizing diuretic protocols set by physician.;Long term: Adoption of self-care  skills and reduction of barriers for early signs and symptoms recognition and intervention leading to self-care maintenance.    Hypertension Yes    Intervention Provide education on lifestyle modifcations including regular physical activity/exercise, weight management, moderate sodium restriction and increased consumption of fresh fruit, vegetables, and low fat dairy, alcohol moderation, and smoking cessation.;Monitor prescription use compliance.    Expected Outcomes Short Term: Continued assessment and intervention until BP is < 140/22mm HG in hypertensive participants. < 130/34mm HG in hypertensive participants with  diabetes, heart failure or chronic kidney disease.;Long Term: Maintenance of blood pressure at goal levels.    Lipids Yes    Intervention Provide education and support for participant on nutrition & aerobic/resistive exercise along with prescribed medications to achieve LDL 70mg , HDL >40mg .    Expected Outcomes Short Term: Participant states understanding of desired cholesterol values and is compliant with medications prescribed. Participant is following exercise prescription and nutrition guidelines.;Long Term: Cholesterol controlled with medications as prescribed, with individualized exercise RX and with personalized nutrition plan. Value goals: LDL < 70mg , HDL > 40 mg.          Core Components/Risk Factors/Patient Goals Review:    Core Components/Risk Factors/Patient Goals at Discharge (Final Review):    ITP Comments:  ITP Comments     Row Name 11/09/23 1033 11/14/23 1432 11/20/23 1059 11/29/23 0834 12/27/23 0848   ITP Comments Virtual orientation visit completed for cardiac rehab with stent placement. On-site orientation visit scheduled for 11/14/23 at 1 pm. Patient attend orientation today.  Patient is attending Cardiac Rehabilitation Program.  Documentation for diagnosis can be found in [facility] 10/17/23 and intervention 10/24/23.  Reviewed medical chart, RPE/RPD, gym safety, and program guidelines.  Patient was fitted to equipment they will be using during rehab.  Patient is scheduled to start exercise on Friday 11/16/23 at 1100.   Initial ITP created and sent for review and signature by Dr. Dorn Ross, Medical Director for Cardiac Rehabilitation Program. First full day of exercise!  Patient was oriented to gym and equipment including functions, settings, policies, and procedures.  Patient's individual exercise prescription and treatment plan were reviewed.  All starting workloads were established based on the results of the 6 minute walk test done at initial orientation visit.  The plan for  exercise progression was also introduced and progression will be customized based on patient's performance and goals. 30 day review completed. ITP sent to Dr.Jonathan Branch, Medical Director of Cardiac Rehab. Continue with ITP unless changes are made by physician. Patient is new to the program. 30 day review completed. ITP sent to Dr.Jonathan Branch, Medical Director of Cardiac Rehab. Continue with ITP unless changes are made by physician. Pt was out sick, then on vacation, and this week was having some back pain and weakness.  She last attended on 11/29/23.  Unable to assess for goals this round.      Comments: 30 day review

## 2023-12-29 ENCOUNTER — Encounter (HOSPITAL_COMMUNITY)

## 2024-01-01 ENCOUNTER — Encounter (HOSPITAL_COMMUNITY)
Admission: RE | Admit: 2024-01-01 | Discharge: 2024-01-01 | Disposition: A | Discharge status: Home / Self Care | Source: Ambulatory Visit | Attending: Cardiology | Admitting: Cardiology

## 2024-01-01 ENCOUNTER — Telehealth (HOSPITAL_COMMUNITY): Payer: Self-pay

## 2024-01-01 DIAGNOSIS — Z955 Presence of coronary angioplasty implant and graft: Secondary | ICD-10-CM | POA: Insufficient documentation

## 2024-01-01 NOTE — Progress Notes (Signed)
 Discharge Progress Report  Patient Details  Name: Taylor [name] MRN: [id number] Date of Birth: [birth year] Referring Provider:   Flowsheet Row CARDIAC REHAB PHASE II ORIENTATION from 11/14/2023 in [facility] CARDIAC REHABILITATION  Referring Provider Lavona Agent MD     Number of Visits: 4   Reason for Discharge:  Early Exit:  Personal and Lack of attendance  Smoking History:  Social History   Tobacco Use  Smoking Status Former   Current packs/day: 0.00   Average packs/day: 0.6 packs/day for 23.0 years (13.8 ttl pk-yrs)   Types: Cigarettes   Start date: 02/05/1996   Quit date: 02/05/2019   Years since quitting: 4.9   Passive exposure: Never  Smokeless Tobacco Never    Diagnosis:  No diagnosis found.  ADL [facility]:   Initial Exercise Prescription:  Initial Exercise Prescription - 11/14/23 1400       Date of Initial Exercise RX and Referring Provider   Date 11/14/23    Referring Provider Lavona Agent MD    Expected Discharge Date 02/02/24   Medicaid     Oxygen   Maintain Oxygen Saturation 88% or higher      NuStep   Level 1    SPM 80    Minutes 15    METs 2      REL-XR   Level 1    Speed 50    Minutes 15    METs 2      Prescription Details   Frequency (times per week) 3    Duration Progress to 30 minutes of continuous aerobic without signs/symptoms of physical distress      Intensity   THRR 40-80% of Max Heartrate 127-160    Ratings of Perceived Exertion 11-13    Perceived Dyspnea 0-4      Progression   Progression Continue to progress workloads to maintain intensity without signs/symptoms of physical distress.      Resistance Training   Training Prescription Yes    Weight 3 lb    Reps 10-15          Discharge Exercise Prescription (Final Exercise Prescription Changes):  Exercise Prescription Changes - 11/14/23 1400       Response to Exercise   Blood Pressure (Admit) 124/62    Blood Pressure (Exercise) 146/72    Blood Pressure (Exit)  124/66    Heart Rate (Admit) 94 bpm    Heart Rate (Exercise) 129 bpm    Heart Rate (Exit) 97 bpm    Oxygen Saturation (Admit) 97 %    Oxygen Saturation (Exercise) 97 %    Oxygen Saturation (Exit) 96 %    Rating of Perceived Exertion (Exercise) 17    Perceived Dyspnea (Exercise) 3    Symptoms SOB, legs tingling, chest pain (6/10)    Comments walk test results          Functional Capacity:  6 Minute Walk     Row Name 11/14/23 1433         6 Minute Walk   Phase Initial     Distance 600 feet     Walk Time 3.75 minutes     # of Rest Breaks 1  1:10 seat rest, stopped at 4:55     MPH 1.82     METS 2.83     RPE 17     Perceived Dyspnea  3     VO2 Peak 9.92     Symptoms Yes (comment)     Comments SOB, legs tingling, chest pain  6/10     Resting HR 94 bpm     Resting BP 124/62     Resting Oxygen Saturation  97 %     Exercise Oxygen Saturation  during 6 min walk 97 %     Max Ex. HR 129 bpm     Max Ex. BP 146/72     2 Minute Post BP 124/66        Psychological, QOL, Others - Outcomes: PHQ 2/9:    11/14/2023    1:07 PM  Depression screen PHQ 2/9  Decreased Interest 1  Down, Depressed, Hopeless 1  PHQ - 2 Score 2  Altered sleeping 1  Tired, decreased energy 1  Change in appetite 1  Feeling bad or failure about yourself  1  Trouble concentrating 0  Moving slowly or fidgety/restless 1  Suicidal thoughts 0  PHQ-9 Score 7  Difficult doing work/chores Somewhat difficult    Quality of Life:   Personal Goals: Goals established at orientation with interventions provided to work toward goal.  Personal Goals and Risk Factors at Admission - 11/14/23 1438       Core Components/Risk Factors/Patient Goals on Admission    Weight Management Yes;Obesity;Weight Loss    Intervention Weight Management: Develop a combined nutrition and exercise program designed to reach desired caloric intake, while maintaining appropriate intake of nutrient and fiber, sodium and fats, and  appropriate energy expenditure required for the weight goal.;Weight Management: Provide education and appropriate resources to help participant work on and attain dietary goals.;Weight Management/Obesity: Establish reasonable short term and long term weight goals.;Obesity: Provide education and appropriate resources to help participant work on and attain dietary goals.    Admit Weight 219 lb 1.6 oz (99.4 kg)    Goal Weight: Short Term 214 lb (97.1 kg)    Goal Weight: Long Term 209 lb (94.8 kg)    Expected Outcomes Short Term: Continue to assess and modify interventions until short term weight is achieved;Long Term: Adherence to nutrition and physical activity/exercise program aimed toward attainment of established weight goal;Weight Loss: Understanding of general recommendations for a balanced deficit meal plan, which promotes 1-2 lb weight loss per week and includes a negative energy balance of [phone] kcal/d;Understanding recommendations for meals to include 15-35% energy as protein, 25-35% energy from fat, 35-60% energy from carbohydrates, less than 200mg  of dietary cholesterol, 20-35 gm of total fiber daily;Understanding of distribution of calorie intake throughout the day with the consumption of 4-5 meals/snacks    Improve shortness of breath with ADL's Yes    Intervention Provide education, individualized exercise plan and daily activity instruction to help decrease symptoms of SOB with activities of daily living.    Expected Outcomes Short Term: Improve cardiorespiratory fitness to achieve a reduction of symptoms when performing ADLs;Long Term: Be able to perform more ADLs without symptoms or delay the onset of symptoms    Intervention Provide education about signs/symptoms and action to take for hypo/hyperglycemia.;Provide education about proper nutrition, including hydration, and aerobic/resistive exercise prescription along with prescribed medications to achieve blood glucose in normal ranges:  Fasting glucose 65-99 mg/dL    Expected Outcomes Short Term: Participant verbalizes understanding of the signs/symptoms and immediate care of hyper/hypoglycemia, proper foot care and importance of medication, aerobic/resistive exercise and nutrition plan for blood glucose control.;Long Term: Attainment of HbA1C < 7%.    Heart Failure Yes    Intervention Provide a combined exercise and nutrition program that is supplemented with education, support and counseling about heart failure.  Directed toward relieving symptoms such as shortness of breath, decreased exercise tolerance, and extremity edema.    Expected Outcomes Improve functional capacity of life;Short term: Attendance in program 2-3 days a week with increased exercise capacity. Reported lower sodium intake. Reported increased fruit and vegetable intake. Reports medication compliance.;Short term: Daily weights obtained and reported for increase. Utilizing diuretic protocols set by physician.;Long term: Adoption of self-care skills and reduction of barriers for early signs and symptoms recognition and intervention leading to self-care maintenance.    Hypertension Yes    Intervention Provide education on lifestyle modifcations including regular physical activity/exercise, weight management, moderate sodium restriction and increased consumption of fresh fruit, vegetables, and low fat dairy, alcohol moderation, and smoking cessation.;Monitor prescription use compliance.    Expected Outcomes Short Term: Continued assessment and intervention until BP is < 140/55mm HG in hypertensive participants. < 130/85mm HG in hypertensive participants with diabetes, heart failure or chronic kidney disease.;Long Term: Maintenance of blood pressure at goal levels.    Lipids Yes    Intervention Provide education and support for participant on nutrition & aerobic/resistive exercise along with prescribed medications to achieve LDL 70mg , HDL >40mg .    Expected Outcomes Short  Term: Participant states understanding of desired cholesterol values and is compliant with medications prescribed. Participant is following exercise prescription and nutrition guidelines.;Long Term: Cholesterol controlled with medications as prescribed, with individualized exercise RX and with personalized nutrition plan. Value goals: LDL < 70mg , HDL > 40 mg.           Personal Goals Discharge:   Exercise Goals and Review:  Exercise Goals     Row Name 11/14/23 1436             Exercise Goals   Increase Physical Activity Yes       Intervention Provide advice, education, support and counseling about physical activity/exercise needs.;Develop an individualized exercise prescription for aerobic and resistive training based on initial evaluation findings, risk stratification, comorbidities and participant's personal goals.       Expected Outcomes Short Term: Attend rehab on a regular basis to increase amount of physical activity.;Long Term: Add in home exercise to make exercise part of routine and to increase amount of physical activity.;Long Term: Exercising regularly at least 3-5 days a week.       Increase Strength and Stamina Yes       Intervention Provide advice, education, support and counseling about physical activity/exercise needs.;Develop an individualized exercise prescription for aerobic and resistive training based on initial evaluation findings, risk stratification, comorbidities and participant's personal goals.       Expected Outcomes Short Term: Increase workloads from initial exercise prescription for resistance, speed, and METs.;Short Term: Perform resistance training exercises routinely during rehab and add in resistance training at home;Long Term: Improve cardiorespiratory fitness, muscular endurance and strength as measured by increased METs and functional capacity ( )       Able to understand and use rate of perceived exertion (RPE) scale Yes       Intervention Provide  education and explanation on how to use RPE scale       Expected Outcomes Short Term: Able to use RPE daily in rehab to express subjective intensity level;Long Term:  Able to use RPE to guide intensity level when exercising independently       Able to understand and use Dyspnea scale Yes       Intervention Provide education and explanation on how to use Dyspnea scale  Expected Outcomes Long Term: Able to use Dyspnea scale to guide intensity level when exercising independently;Short Term: Able to use Dyspnea scale daily in rehab to express subjective sense of shortness of breath during exertion       Knowledge and understanding of Target Heart Rate Range (THRR) Yes       Intervention Provide education and explanation of THRR including how the numbers were predicted and where they are located for reference       Expected Outcomes Short Term: Able to state/look up THRR;Long Term: Able to use THRR to govern intensity when exercising independently;Short Term: Able to use daily as guideline for intensity in rehab       Able to check pulse independently Yes       Intervention Provide education and demonstration on how to check pulse in carotid and radial arteries.;Review the importance of being able to check your own pulse for safety during independent exercise       Expected Outcomes Short Term: Able to explain why pulse checking is important during independent exercise;Long Term: Able to check pulse independently and accurately       Understanding of Exercise Prescription Yes       Intervention Provide education, explanation, and written materials on patient's individual exercise prescription       Expected Outcomes Short Term: Able to explain program exercise prescription;Long Term: Able to explain home exercise prescription to exercise independently          Exercise Goals Re-Evaluation:  Exercise Goals Re-Evaluation     Row Name 11/20/23 1059             Exercise Goal Re-Evaluation    Exercise Goals Review Able to understand and use rate of perceived exertion (RPE) scale;Able to understand and use Dyspnea scale;Understanding of Exercise Prescription       Comments Reviewed RPE and dyspnea scale, THR and program prescription with pt today.  Pt voiced understanding and was given a copy of goals to take home.       Expected Outcomes Short: Use RPE daily to regulate intensity.  Long: Follow program prescription in THR.          Nutrition & Weight - Outcomes:  Pre Biometrics - 11/14/23 1437       Pre Biometrics   Height 5' 2.5 (1.588 m)    Weight 99.4 kg    Waist Circumference 47 inches    Hip Circumference 44 inches    Waist to Hip Ratio 1.07 %    BMI (Calculated) 39.41    Grip Strength 26.5 kg    Single Leg Stand 3.4 seconds           Nutrition:  Nutrition Therapy & Goals - 11/14/23 1437       Intervention Plan   Intervention Prescribe, educate and counsel regarding individualized specific dietary modifications aiming towards targeted core components such as weight, hypertension, lipid management, diabetes, heart failure and other comorbidities.;Nutrition handout(s) given to patient.    Expected Outcomes Short Term Goal: Understand basic principles of dietary content, such as calories, fat, sodium, cholesterol and nutrients.;Long Term Goal: Adherence to prescribed nutrition plan.          Nutrition Discharge:   Education Questionnaire Score:   Goals reviewed with patient; copy given to patient.

## 2024-01-01 NOTE — Progress Notes (Signed)
 Cardiac Individual Treatment Plan  Patient Details  Name: Taylor [name] MRN: [id number] Date of Birth: [birth year] Referring Provider:   Flowsheet Row CARDIAC REHAB PHASE II ORIENTATION from 11/14/2023 in [facility]  Referring Provider Lavona Agent MD    Initial Encounter Date:  Flowsheet Row CARDIAC REHAB PHASE II ORIENTATION from 11/14/2023 in [facility]  Date 11/14/23    Visit Diagnosis: No diagnosis found.  Patient's Home Medications on Admission:  Current Outpatient Medications:    aspirin  81 MG EC tablet, Take 81 mg by mouth daily. , Disp: , Rfl:    atorvastatin  (LIPITOR ) 80 MG tablet, TAKE 1 TABLET BY MOUTH DAILY, Disp: 30 tablet, Rfl: 0   blood glucose meter kit and supplies, Dispense based on patient and insurance preference. Use up to four times daily as directed. (FOR ICD-10 E10.9, E11.9)., Disp: 1 each, Rfl: 0   cyclobenzaprine (FLEXERIL) 10 MG tablet, Take 10 mg by mouth 3 (three) times daily., Disp: , Rfl:    dapagliflozin  propanediol (FARXIGA ) 10 MG TABS tablet, Take 10 mg by mouth daily. , Disp: , Rfl:    diazepam  (VALIUM ) 5 MG tablet, Take 5 mg by mouth 2 (two) times daily.  (Patient not taking: Reported on 12/07/2023), Disp: , Rfl:    DULoxetine  (CYMBALTA ) 60 MG capsule, Take 60 mg by mouth daily., Disp: , Rfl:    ferrous gluconate  (FERGON) 324 MG tablet, Take 324 mg by mouth daily., Disp: , Rfl:    FLUoxetine  (PROZAC ) 10 MG capsule, Take 10 mg by mouth in the morning., Disp: , Rfl:    HYDROcodone -acetaminophen  (NORCO/VICODIN) 5-325 MG tablet, Take 1 tablet by mouth every 6 (six) hours as needed for severe pain., Disp: 20 tablet, Rfl: 0   hydrOXYzine (VISTARIL) 25 MG capsule, Take 25 mg by mouth 3 (three) times daily., Disp: , Rfl:    ivabradine  (CORLANOR ) 5 MG TABS tablet, Take 1 tablet (5 mg total) by mouth 2 (two) times daily., Disp: 60 tablet, Rfl: 6   levETIRAcetam  (KEPPRA  XR) 500 MG 24 hr tablet, Take 2 tablets (1,000 mg  total) by mouth daily., Disp: 180 tablet, Rfl: 3   magnesium  oxide (MAG-OX) 400 MG tablet, Take 400 mg by mouth daily., Disp: , Rfl:    metFORMIN  (GLUCOPHAGE ) 500 MG tablet, Take 500 mg by mouth 3 (three) times daily., Disp: , Rfl:    metoprolol  succinate (TOPROL -XL) 25 MG 24 hr tablet, Take 0.5 tablets (12.5 mg total) by mouth daily., Disp: 45 tablet, Rfl: 0   nitroGLYCERIN  (NITROSTAT ) 0.4 MG SL tablet, Place 1 tablet (0.4 mg total) under the tongue every 5 (five) minutes as needed for chest pain., Disp: 90 tablet, Rfl: 3   pantoprazole  (PROTONIX ) 40 MG tablet, Take 1 tablet (40 mg total) by mouth daily., Disp: 30 tablet, Rfl: 0   pregabalin  (LYRICA ) 150 MG capsule, TAKE 1 CAPSULE BY MOUTH THREE TIMES DAILY, Disp: 90 capsule, Rfl: 5   ranolazine  (RANEXA ) 500 MG 12 hr tablet, Take 1 tablet (500 mg total) by mouth 2 (two) times daily., Disp: 180 tablet, Rfl: 0   risperiDONE  (RISPERDAL ) 0.5 MG tablet, Take 0.5 mg by mouth at bedtime., Disp: , Rfl:    sacubitril -valsartan  (ENTRESTO ) 24-26 MG, Take 1 tablet by mouth 2 (two) times daily., Disp: 60 tablet, Rfl: 6   Semaglutide ,0.25 or 0.5MG /DOS, 2 MG/3ML SOPN, Inject 0.25 mg into the skin once a week for 28 days., Disp: 3 mL, Rfl: 0   ticagrelor  (BRILINTA ) 90 MG TABS  tablet, Take 1 tablet (90 mg total) by mouth 2 (two) times daily., Disp: 180 tablet, Rfl: 0   tiZANidine  (ZANAFLEX ) 2 MG tablet, TAKE 1 TABLET BY MOUTH AT BEDTIME AS NEEDED FOR MUSCLE SPASMS, Disp: 30 tablet, Rfl: 2   traMADol  (ULTRAM ) 50 MG tablet, Take 50 mg by mouth 4 (four) times daily., Disp: , Rfl:    traZODone  (DESYREL ) 100 MG tablet, Take 200 mg by mouth at bedtime. 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   due to elevated blood sugar 1700-off insulin  6 months   Depression    Diabetes mellitus     off insulin  6 months   Diverticulitis    Dyspnea    when walking   GERD (gastroesophageal reflux disease)    Hypertension    Kidney dialysis status    on dialysis(stroke) off after 4-6 weeks   Pneumonia 2013, 06/2018   Respiratory failure (HCC) 04/2012   with stroke - in hosp [facility] 12 weeks   Seizures (HCC)    during elvated blood sugsr episode 11/13   Sepsis (HCC)    Stroke (HCC)    11/2018, 12/2018 Weak left side, speech- slurred, Short term memeroy, Gait unsteady    Vocal cord dysfunction     Tobacco Use: Social History   Tobacco Use  Smoking Status Former   Current packs/day: 0.00   Average packs/day: 0.6 packs/day for 23.0 years (13.8 ttl pk-yrs)   Types: Cigarettes   Start date: 02/05/1996   Quit date: 02/05/2019   Years since quitting: 4.9   Passive exposure: Never  Smokeless Tobacco Never    Labs: Review Flowsheet  More data exists      Latest Ref Rng & Units 02/01/2022 02/04/2022 02/05/2022 10/17/2023 10/18/2023  Labs for ITP Cardiac and Pulmonary Rehab  Cholestrol 0 - 200 mg/dL - - 880  - 863   LDL (calc) 0 - 99 mg/dL - - 28  - 47   HDL-C >59 mg/dL - - 39  - 59   Trlycerides <150 mg/dL - - 738  - 849   Hemoglobin A1c 4.8 - 5.6 % - 6.9  - 8.0  -  TCO2 22 - 32 mmol/L 26  - - - -    Capillary Blood Glucose: Lab Results  Component Value Date   GLUCAP 212 (H) 11/29/2023   GLUCAP 193 (H) 11/22/2023   GLUCAP 253 (H) 11/22/2023   GLUCAP 154 (H) 11/20/2023   GLUCAP 176 (H) 11/20/2023     Exercise Target Goals: Exercise Program Goal: Individual exercise prescription set using results from initial 6 min walk test and THRR while considering  patient's activity barriers and safety.   Exercise Prescription Goal: Starting with aerobic activity 30 plus minutes a day, 3 days per week for initial exercise prescription. Provide home exercise prescription and guidelines that participant acknowledges understanding prior to discharge.  Activity Barriers & Risk  Stratification:  Activity Barriers & Cardiac Risk Stratification - 11/14/23 1305       Activity Barriers & Cardiac Risk Stratification   Activity Barriers Deconditioning;Balance Concerns;Back Problems;Shortness of Breath;Chest Pain/Angina;Decreased Ventricular Function;Other (comment);Muscular Weakness    Comments Hx three strokes with residuals (limp), not candidate for bypass, no chest pain    Cardiac Risk Stratification High  6 Minute Walk:  6 Minute Walk     Row Name 11/14/23 1433         6 Minute Walk   Phase Initial     Distance 600 feet     Walk Time 3.75 minutes     # of Rest Breaks 1  1:10 seat rest, stopped at 4:55     MPH 1.82     METS 2.83     RPE 17     Perceived Dyspnea  3     VO2 Peak 9.92     Symptoms Yes (comment)     Comments SOB, legs tingling, chest pain 6/10     Resting HR 94 bpm     Resting BP 124/62     Resting Oxygen Saturation  97 %     Exercise Oxygen Saturation  during 6 min walk 97 %     Max Ex. HR 129 bpm     Max Ex. BP 146/72     2 Minute Post BP 124/66        Oxygen Initial Assessment:   Oxygen Re-Evaluation:   Oxygen Discharge (Final Oxygen Re-Evaluation):   Initial Exercise Prescription:  Initial Exercise Prescription - 11/14/23 1400       Date of Initial Exercise RX and Referring Provider   Date 11/14/23    Referring Provider Lavona Agent MD    Expected Discharge Date 02/02/24   Medicaid     Oxygen   Maintain Oxygen Saturation 88% or higher      NuStep   Level 1    SPM 80    Minutes 15    METs 2      REL-XR   Level 1    Speed 50    Minutes 15    METs 2      Prescription Details   Frequency (times per week) 3    Duration Progress to 30 minutes of continuous aerobic without signs/symptoms of physical distress      Intensity   THRR 40-80% of Max Heartrate 127-160    Ratings of Perceived Exertion 11-13    Perceived Dyspnea 0-4      Progression   Progression Continue to progress workloads to  maintain intensity without signs/symptoms of physical distress.      Resistance Training   Training Prescription Yes    Weight 3 lb    Reps 10-15          Perform Capillary Blood Glucose checks as needed.  Exercise Prescription Changes:   Exercise Prescription Changes     Row Name 11/14/23 1400             Response to Exercise   Blood Pressure (Admit) 124/62       Blood Pressure (Exercise) 146/72       Blood Pressure (Exit) 124/66       Heart Rate (Admit) 94 bpm       Heart Rate (Exercise) 129 bpm       Heart Rate (Exit) 97 bpm       Oxygen Saturation (Admit) 97 %       Oxygen Saturation (Exercise) 97 %       Oxygen Saturation (Exit) 96 %       Rating of Perceived Exertion (Exercise) 17       Perceived Dyspnea (Exercise) 3       Symptoms SOB, legs tingling, chest pain (6/10)       Comments walk test results  Exercise Comments:   Exercise Comments     Row Name 11/20/23 1059           Exercise Comments First full day of exercise!  Patient was oriented to gym and equipment including functions, settings, policies, and procedures.  Patient's individual exercise prescription and treatment plan were reviewed.  All starting workloads were established based on the results of the 6 minute walk test done at initial orientation visit.  The plan for exercise progression was also introduced and progression will be customized based on patient's performance and goals.          Exercise Goals and Review:   Exercise Goals     Row Name 11/14/23 1436             Exercise Goals   Increase Physical Activity Yes       Intervention Provide advice, education, support and counseling about physical activity/exercise needs.;Develop an individualized exercise prescription for aerobic and resistive training based on initial evaluation findings, risk stratification, comorbidities and participant's personal goals.       Expected Outcomes Short Term: Attend rehab on a regular  basis to increase amount of physical activity.;Long Term: Add in home exercise to make exercise part of routine and to increase amount of physical activity.;Long Term: Exercising regularly at least 3-5 days a week.       Increase Strength and Stamina Yes       Intervention Provide advice, education, support and counseling about physical activity/exercise needs.;Develop an individualized exercise prescription for aerobic and resistive training based on initial evaluation findings, risk stratification, comorbidities and participant's personal goals.       Expected Outcomes Short Term: Increase workloads from initial exercise prescription for resistance, speed, and METs.;Short Term: Perform resistance training exercises routinely during rehab and add in resistance training at home;Long Term: Improve cardiorespiratory fitness, muscular endurance and strength as measured by increased METs and functional capacity ( )       Able to understand and use rate of perceived exertion (RPE) scale Yes       Intervention Provide education and explanation on how to use RPE scale       Expected Outcomes Short Term: Able to use RPE daily in rehab to express subjective intensity level;Long Term:  Able to use RPE to guide intensity level when exercising independently       Able to understand and use Dyspnea scale Yes       Intervention Provide education and explanation on how to use Dyspnea scale       Expected Outcomes Long Term: Able to use Dyspnea scale to guide intensity level when exercising independently;Short Term: Able to use Dyspnea scale daily in rehab to express subjective sense of shortness of breath during exertion       Knowledge and understanding of Target Heart Rate Range (THRR) Yes       Intervention Provide education and explanation of THRR including how the numbers were predicted and where they are located for reference       Expected Outcomes Short Term: Able to state/look up THRR;Long Term: Able to use  THRR to govern intensity when exercising independently;Short Term: Able to use daily as guideline for intensity in rehab       Able to check pulse independently Yes       Intervention Provide education and demonstration on how to check pulse in carotid and radial arteries.;Review the importance of being able to check your own pulse for safety during independent  exercise       Expected Outcomes Short Term: Able to explain why pulse checking is important during independent exercise;Long Term: Able to check pulse independently and accurately       Understanding of Exercise Prescription Yes       Intervention Provide education, explanation, and written materials on patient's individual exercise prescription       Expected Outcomes Short Term: Able to explain program exercise prescription;Long Term: Able to explain home exercise prescription to exercise independently          Exercise Goals Re-Evaluation :  Exercise Goals Re-Evaluation     Row Name 11/20/23 1059             Exercise Goal Re-Evaluation   Exercise Goals Review Able to understand and use rate of perceived exertion (RPE) scale;Able to understand and use Dyspnea scale;Understanding of Exercise Prescription       Comments Reviewed RPE and dyspnea scale, THR and program prescription with pt today.  Pt voiced understanding and was given a copy of goals to take home.       Expected Outcomes Short: Use RPE daily to regulate intensity.  Long: Follow program prescription in THR.           Discharge Exercise Prescription (Final Exercise Prescription Changes):  Exercise Prescription Changes - 11/14/23 1400       Response to Exercise   Blood Pressure (Admit) 124/62    Blood Pressure (Exercise) 146/72    Blood Pressure (Exit) 124/66    Heart Rate (Admit) 94 bpm    Heart Rate (Exercise) 129 bpm    Heart Rate (Exit) 97 bpm    Oxygen Saturation (Admit) 97 %    Oxygen Saturation (Exercise) 97 %    Oxygen Saturation (Exit) 96 %     Rating of Perceived Exertion (Exercise) 17    Perceived Dyspnea (Exercise) 3    Symptoms SOB, legs tingling, chest pain (6/10)    Comments walk test results          Nutrition:  Target Goals: Understanding of nutrition guidelines, daily intake of sodium 1500mg , cholesterol 200mg , calories 30% from fat and 7% or less from saturated fats, daily to have 5 or more servings of fruits and vegetables.  Biometrics:  Pre Biometrics - 11/14/23 1437       Pre Biometrics   Height 5' 2.5 (1.588 m)    Weight 99.4 kg    Waist Circumference 47 inches    Hip Circumference 44 inches    Waist to Hip Ratio 1.07 %    BMI (Calculated) 39.41    Grip Strength 26.5 kg    Single Leg Stand 3.4 seconds           Nutrition Therapy Plan and Nutrition Goals:  Nutrition Therapy & Goals - 11/14/23 1437       Intervention Plan   Intervention Prescribe, educate and counsel regarding individualized specific dietary modifications aiming towards targeted core components such as weight, hypertension, lipid management, diabetes, heart failure and other comorbidities.;Nutrition handout(s) given to patient.    Expected Outcomes Short Term Goal: Understand basic principles of dietary content, such as calories, fat, sodium, cholesterol and nutrients.;Long Term Goal: Adherence to prescribed nutrition plan.          Nutrition Assessments:  MEDIFICTS Score Key: >=70 Need to make dietary changes  40-70 Heart Healthy Diet <= 40 Therapeutic Level Cholesterol Diet  Flowsheet Row CARDIAC REHAB PHASE II ORIENTATION from 11/14/2023 in [place]  Picture Your Plate Total Score on Admission 54   Picture Your Plate Scores: <59 Unhealthy dietary pattern with much room for improvement. 41-50 Dietary pattern unlikely to meet recommendations for good health and room for improvement. 51-60 More healthful dietary pattern, with some room for improvement.  >60 Healthy dietary pattern, although there  may be some specific behaviors that could be improved.    Nutrition Goals Re-Evaluation:   Nutrition Goals Discharge (Final Nutrition Goals Re-Evaluation):   Psychosocial: Target Goals: Acknowledge presence or absence of significant depression and/or stress, maximize coping skills, provide positive support system. Participant is able to verbalize types and ability to use techniques and skills needed for reducing stress and depression.  Initial Review & Psychosocial Screening:  Initial Psych Review & Screening - 11/09/23 1004       Initial Review   Current issues with History of Depression;Current Anxiety/Panic;Current Psychotropic Meds;Current Depression      Family Dynamics   Good Support System? Yes      Barriers   Psychosocial barriers to participate in program The patient should benefit from training in stress management and relaxation.;There are no identifiable barriers or psychosocial needs.      Screening Interventions   Interventions Encouraged to exercise;To provide support and resources with identified psychosocial needs;Provide feedback about the scores to participant    Expected Outcomes Short Term goal: Utilizing psychosocial counselor, staff and physician to assist with identification of specific Stressors or current issues interfering with healing process. Setting desired goal for each stressor or current issue identified.;Long Term Goal: Stressors or current issues are controlled or eliminated.;Short Term goal: Identification and review with participant of any Quality of Life or Depression concerns found by scoring the questionnaire.;Long Term goal: The participant improves quality of Life and PHQ9 Scores as seen by post scores and/or verbalization of changes          Quality of Life Scores:  Scores of 19 and below usually indicate a poorer quality of life in these areas.  A difference of  2-3 points is a clinically meaningful difference.  A difference of 2-3 points  in the total score of the Quality of Life Index has been associated with significant improvement in overall quality of life, self-image, physical symptoms, and general health in studies assessing change in quality of life.  PHQ-9: Review Flowsheet       11/14/2023  Depression screen PHQ 2/9  Decreased Interest 1  Down, Depressed, Hopeless 1  PHQ - 2 Score 2  Altered sleeping 1  Tired, decreased energy 1  Change in appetite 1  Feeling bad or failure about yourself  1  Trouble concentrating 0  Moving slowly or fidgety/restless 1  Suicidal thoughts 0  PHQ-9 Score 7  Difficult doing work/chores Somewhat difficult   Interpretation of Total Score  Total Score Depression Severity:  1-4 = Minimal depression, 5-9 = Mild depression, 10-14 = Moderate depression, 15-19 = Moderately severe depression, 20-27 = Severe depression   Psychosocial Evaluation and Intervention:  Psychosocial Evaluation - 11/09/23 1006       Psychosocial Evaluation & Interventions   Interventions Stress management education;Relaxation education;Encouraged to exercise with the program and follow exercise prescription    Comments Patient was referred to [facility] with [facility] placement. Her mother answered all questions. Patient has a long history of depression and anxiety and seizure disorder. She lives with her parents who support her along with her brother. Her mother is her legal guardian. She is seeing a psychiatrist routinely but  her mother says they are currently looking for another therapist. She is on diazepam , prozac , risperdal  and Trazodone  for management of her psychosocial issure. She sleeps well at night. Her mother says her depression has worsened since Januray 2025 when she developed chronic back pain. She has been evaulated but no cause has been found. She says she is not motivated to do a lot of activities due to the pain. 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 Her goals for  the program are to lose weight, improve her SOB and get more active. Her mother has done cardiac rehab here and she is very motivated for her daughter to participate; however, her chronic pain and depression could be a barrier for her to particiapte consistently.    Expected Outcomes Short Term: Patient will start the program and attend consistently. Long Term: Patient will complete the program meeting personal goals.    Continue Psychosocial Services  Follow up required by staff          Psychosocial Re-Evaluation:   Psychosocial Discharge (Final Psychosocial Re-Evaluation):   Vocational Rehabilitation: Provide vocational rehab assistance to qualifying candidates.   Vocational Rehab Evaluation & Intervention:  Vocational Rehab - 11/09/23 1003       Initial Vocational Rehab Evaluation & Intervention   Assessment shows need for Vocational Rehabilitation No      Vocational Rehab Re-Evaulation   Comments Patient is disabled.          Education: Education Goals: Education classes will be provided on a weekly basis, covering required topics. Participant will state understanding/return demonstration of topics presented.  Learning Barriers/Preferences:  Learning Barriers/Preferences - 11/09/23 1003       Learning Barriers/Preferences   Learning Barriers None    Learning Preferences Written Material;Audio;Skilled Demonstration          Education Topics: Hypertension, Hypertension Reduction -Define heart disease and high blood pressure. Discus how high blood pressure affects the body and ways to reduce high blood pressure.   Exercise and Your Heart -Discuss why it is important to exercise, the FITT principles of exercise, normal and abnormal responses to exercise, and how to exercise safely. Flowsheet Row CARDIAC REHAB PHASE II EXERCISE from 11/29/2023 in [facility]  Date 11/29/23  Educator HB  Instruction Review Code 1- Verbalizes Understanding     Angina -Discuss definition of angina, causes of angina, treatment of angina, and how to decrease risk of having angina.   Cardiac Medications -Review what the following cardiac medications are used for, how they affect the body, and side effects that may occur when taking the medications.  Medications include Aspirin , Beta blockers, calcium  channel blockers, ACE Inhibitors, angiotensin receptor blockers, diuretics, digoxin, and antihyperlipidemics.   Congestive Heart Failure -Discuss the definition of CHF, how to live with CHF, the signs and symptoms of CHF, and how keep track of weight and sodium intake.   Heart Disease and Intimacy -Discus the effect sexual activity has on the heart, how changes occur during intimacy as we age, and safety during sexual activity.   Smoking Cessation / COPD -Discuss different methods to quit smoking, the health benefits of quitting smoking, and the definition of COPD.   Nutrition I: Fats -Discuss the types of cholesterol, what cholesterol does to the heart, and how cholesterol levels can be controlled.   Nutrition II: Labels -Discuss the different components of food labels and how to read food label   Heart Parts/Heart Disease  and PAD -Discuss the anatomy of the heart, the pathway of blood circulation through the heart, and these are affected by heart disease.   Stress I: Signs and Symptoms -Discuss the causes of stress, how stress may lead to anxiety and depression, and ways to limit stress.   Stress II: Relaxation -Discuss different types of relaxation techniques to limit stress.   Warning Signs of Stroke / TIA -Discuss definition of a stroke, what the signs and symptoms are of a stroke, and how to identify when someone is having stroke.   Knowledge Questionnaire Score:   Core Components/Risk Factors/Patient Goals at Admission:  Personal Goals and Risk Factors at Admission - 11/14/23 1438       Core Components/Risk  Factors/Patient Goals on Admission    Weight Management Yes;Obesity;Weight Loss    Intervention Weight Management: Develop a combined nutrition and exercise program designed to reach desired caloric intake, while maintaining appropriate intake of nutrient and fiber, sodium and fats, and appropriate energy expenditure required for the weight goal.;Weight Management: Provide education and appropriate resources to help participant work on and attain dietary goals.;Weight Management/Obesity: Establish reasonable short term and long term weight goals.;Obesity: Provide education and appropriate resources to help participant work on and attain dietary goals.    Admit Weight 219 lb 1.6 oz (99.4 kg)    Goal Weight: Short Term 214 lb (97.1 kg)    Goal Weight: Long Term 209 lb (94.8 kg)    Expected Outcomes Short Term: Continue to assess and modify interventions until short term weight is achieved;Long Term: Adherence to nutrition and physical activity/exercise program aimed toward attainment of established weight goal;Weight Loss: Understanding of general recommendations for a balanced deficit meal plan, which promotes 1-2 lb weight loss per week and includes a negative energy balance of [phone] kcal/d;Understanding recommendations for meals to include 15-35% energy as protein, 25-35% energy from fat, 35-60% energy from carbohydrates, less than 200mg  of dietary cholesterol, 20-35 gm of total fiber daily;Understanding of distribution of calorie intake throughout the day with the consumption of 4-5 meals/snacks    Improve shortness of breath with ADL's Yes    Intervention Provide education, individualized exercise plan and daily activity instruction to help decrease symptoms of SOB with activities of daily living.    Expected Outcomes Short Term: Improve cardiorespiratory fitness to achieve a reduction of symptoms when performing ADLs;Long Term: Be able to perform more ADLs without symptoms or delay the onset of  symptoms    Intervention Provide education about signs/symptoms and action to take for hypo/hyperglycemia.;Provide education about proper nutrition, including hydration, and aerobic/resistive exercise prescription along with prescribed medications to achieve blood glucose in normal ranges: Fasting glucose 65-99 mg/dL    Expected Outcomes Short Term: Participant verbalizes understanding of the signs/symptoms and immediate care of hyper/hypoglycemia, proper foot care and importance of medication, aerobic/resistive exercise and nutrition plan for blood glucose control.;Long Term: Attainment of HbA1C < 7%.    Heart Failure Yes    Intervention Provide a combined exercise and nutrition program that is supplemented with education, support and counseling about heart failure. Directed toward relieving symptoms such as shortness of breath, decreased exercise tolerance, and extremity edema.    Expected Outcomes Improve functional capacity of life;Short term: Attendance in program 2-3 days a week with increased exercise capacity. Reported lower sodium intake. Reported increased fruit and vegetable intake. Reports medication compliance.;Short term: Daily weights obtained and reported for increase. Utilizing diuretic protocols set by physician.;Long term: Adoption of self-care skills and reduction  of barriers for early signs and symptoms recognition and intervention leading to self-care maintenance.    Hypertension Yes    Intervention Provide education on lifestyle modifcations including regular physical activity/exercise, weight management, moderate sodium restriction and increased consumption of fresh fruit, vegetables, and low fat dairy, alcohol moderation, and smoking cessation.;Monitor prescription use compliance.    Expected Outcomes Short Term: Continued assessment and intervention until BP is < 140/53mm HG in hypertensive participants. < 130/7mm HG in hypertensive participants with diabetes, heart failure or  chronic kidney disease.;Long Term: Maintenance of blood pressure at goal levels.    Lipids Yes    Intervention Provide education and support for participant on nutrition & aerobic/resistive exercise along with prescribed medications to achieve LDL 70mg , HDL >40mg .    Expected Outcomes Short Term: Participant states understanding of desired cholesterol values and is compliant with medications prescribed. Participant is following exercise prescription and nutrition guidelines.;Long Term: Cholesterol controlled with medications as prescribed, with individualized exercise RX and with personalized nutrition plan. Value goals: LDL < 70mg , HDL > 40 mg.          Core Components/Risk Factors/Patient Goals Review:    Core Components/Risk Factors/Patient Goals at Discharge (Final Review):    ITP Comments:  ITP Comments     Row Name 11/09/23 1033 11/14/23 1432 11/20/23 1059 11/29/23 0834 12/27/23 0848   ITP Comments Virtual orientation visit completed for cardiac rehab with stent placement. On-site orientation visit scheduled for 11/14/23 at 1 pm. Patient attend orientation today.  Patient is attending Cardiac Rehabilitation Program.  Documentation for diagnosis can be found in [facility] 10/17/23 and intervention 10/24/23.  Reviewed medical chart, RPE/RPD, gym safety, and program guidelines.  Patient was fitted to equipment they will be using during rehab.  Patient is scheduled to start exercise on Friday 11/16/23 at 1100.   Initial ITP created and sent for review and signature by Dr. Dorn Ross, Medical Director for Cardiac Rehabilitation Program. First full day of exercise!  Patient was oriented to gym and equipment including functions, settings, policies, and procedures.  Patient's individual exercise prescription and treatment plan were reviewed.  All starting workloads were established based on the results of the 6 minute walk test done at initial orientation visit.  The plan for exercise progression was also  introduced and progression will be customized based on patient's performance and goals. 30 day review completed. ITP sent to Dr.Jonathan Branch, Medical Director of Cardiac Rehab. Continue with ITP unless changes are made by physician. Patient is new to the program. 30 day review completed. ITP sent to Dr.Jonathan Branch, Medical Director of Cardiac Rehab. Continue with ITP unless changes are made by physician. Pt was out sick, then on vacation, and this week was having some back pain and weakness.  She last attended on 11/29/23.  Unable to assess for goals this round.      Comments: Discharge ITP. Patient no longer wanting to attend program.

## 2024-01-01 NOTE — Progress Notes (Signed)
 Patient discharged from program due to lack of attendance/ personal choice to not come back. All appropriate D/C information performed.

## 2024-01-03 ENCOUNTER — Encounter (HOSPITAL_COMMUNITY)

## 2024-01-05 ENCOUNTER — Encounter (HOSPITAL_COMMUNITY)

## 2024-01-08 ENCOUNTER — Encounter (HOSPITAL_COMMUNITY)

## 2024-01-10 ENCOUNTER — Encounter (HOSPITAL_COMMUNITY)

## 2024-01-12 ENCOUNTER — Encounter (HOSPITAL_COMMUNITY)

## 2024-01-15 ENCOUNTER — Encounter (HOSPITAL_COMMUNITY)

## 2024-01-17 ENCOUNTER — Encounter (HOSPITAL_COMMUNITY)

## 2024-01-18 ENCOUNTER — Encounter (INDEPENDENT_AMBULATORY_CARE_PROVIDER_SITE_OTHER): Admitting: Internal Medicine

## 2024-01-18 ENCOUNTER — Encounter: Payer: Self-pay | Admitting: Internal Medicine

## 2024-01-18 VITALS — BP 115/74 | HR 114 | Ht 64.0 in | Wt 212.0 lb

## 2024-01-18 DIAGNOSIS — I5020 Unspecified systolic (congestive) heart failure: Secondary | ICD-10-CM | POA: Insufficient documentation

## 2024-01-18 DIAGNOSIS — R000 Tachycardia, unspecified: Secondary | ICD-10-CM | POA: Insufficient documentation

## 2024-01-18 DIAGNOSIS — Z7902 Long term (current) use of antithrombotics/antiplatelets: Secondary | ICD-10-CM | POA: Insufficient documentation

## 2024-01-18 DIAGNOSIS — I4891 Unspecified atrial fibrillation: Secondary | ICD-10-CM | POA: Insufficient documentation

## 2024-01-18 MED ORDER — IVABRADINE HCL 7.5 MG PO TABS: 7.5000 mg | ORAL_TABLET | Freq: Two times a day (BID) | ORAL | 5 refills | Status: DC

## 2024-01-18 MED ORDER — HYDROCODONE-ACETAMINOPHEN 5-325 MG PO TABS
1.0000 | ORAL_TABLET | Freq: Four times a day (QID) | ORAL | 0 refills | Status: AC | PRN
Start: 2024-01-18 — End: ?

## 2024-01-18 MED ORDER — RANOLAZINE ER 1000 MG PO TB12: 1000.0000 mg | ORAL_TABLET | Freq: Two times a day (BID) | ORAL | 5 refills | Status: DC

## 2024-01-18 MED ORDER — SPIRONOLACTONE 25 MG PO TABS: 12.5000 mg | ORAL_TABLET | Freq: Every day | ORAL | 3 refills | Status: DC

## 2024-01-18 NOTE — Patient Instructions (Addendum)
 Medication Instructions:  Your physician has recommended you make the following change in your medication:  Increase Ranexa  from 500 mg to 1000 twice daily  Increase Ivabradine  from 5 mg to 7.5 twice daily  Spironolactone  12.5 mg once daily Norco 5-325 mg every 6 hours as needed for pain  Continue taking all other medications as prescribed  Labwork: None  Testing/Procedures: None  Follow-Up: Your physician recommends that you schedule a follow-up appointment in: 6 weeks  Any Other Special Instructions Will Be Listed Below (If Applicable). Thank you for choosing [facility]!     If you need a refill on your cardiac medications before your next appointment, please call your pharmacy.

## 2024-01-18 NOTE — Progress Notes (Signed)
 Cardiology Office Note  Date: 01/18/2024   ID: Taylor [name], DOB February 11, 1981, MRN [id number]  PCP:  Maree Isles, MD  Cardiologist:  Lynwood Schilling, MD Electrophysiologist:  None   History of Present Illness: Taylor [name] is a 43 y.o. female  Here for follow-up of CAD.  Admitted for nonanginal pain but CT cardiac showed coronary calcium  score of 2837 (99th percentile for age and sex matched control), total plaque volume is extensive and severe diffuse plaque throughout proximal LAD, LCx and RCA leading to downstream flow limitations in all 3 vessels.  She underwent LHC that showed severe multivessel CAD and LVEDP 38 mmHg.  CT surgery was consulted, not a candidate for CABG and hence underwent PCI of mid to distal Lcx. If she continues to have refractory chest pains to medical management, management would be debulking PCI of LAD crossing diagonal (not involving ostial LAD)--> distal LM into ostial Lcx + provisional PTCA/PCI of ostial LAD and medical management of RCA disease.  Accompanied by mother at this time.  Collateral history is provided by mother.  Patient reported having exertional chest pain 3-4 times per week.  Lasting for about 5 minutes.  Occurs with activities like walking in the [employer], lifting her dog, walking uphill etc.  She also noticed having 4 pillow orthopnea and PND x 4 months that did not get better after PCI.  No leg swelling.  No DOE because she is not much active at home.  Her heart rates at rest have been high due to severe anxiety.   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   due to elevated blood sugar 1700-off insulin  6 months   Depression    Diabetes mellitus    off insulin  6 months   Diverticulitis    Dyspnea    when walking   GERD (gastroesophageal reflux disease)    Hypertension    Kidney dialysis status     on dialysis(stroke) off after 4-6 weeks   Pneumonia 2013, 06/2018   Respiratory failure (HCC) 04/2012   with stroke - in hosp [facility] 12 weeks   Seizures (HCC)    during elvated blood sugsr episode 11/13   Sepsis (HCC)    Stroke (HCC)    11/2018, 12/2018 Weak left side, speech- slurred, Short term memeroy, Gait unsteady    Vocal cord dysfunction     Past Surgical History:  Procedure Laterality Date   ABDOMINAL AORTOGRAM W/LOWER EXTREMITY N/A 06/25/2019   Procedure: ABDOMINAL AORTOGRAM W/LOWER EXTREMITY;  Surgeon: Serene Gaile ORN, MD;  Location: [facility];  Service: Cardiovascular;  Laterality: N/A;   ABDOMINAL AORTOGRAM W/LOWER EXTREMITY N/A 10/22/2019   Procedure: ABDOMINAL AORTOGRAM W/LOWER EXTREMITY;  Surgeon: Serene Gaile ORN, MD;  Location: [facility];  Service: Cardiovascular;  Laterality: N/A;   ABDOMINAL AORTOGRAM W/LOWER EXTREMITY Left 04/01/2020   Procedure: ABDOMINAL AORTOGRAM W/LOWER EXTREMITY;  Surgeon: Gretta Lonni PARAS, MD;  Location: [facility];  Service: Cardiovascular;  Laterality: Left;   ABDOMINAL AORTOGRAM W/LOWER EXTREMITY N/A 02/01/2022   Procedure: ABDOMINAL AORTOGRAM W/LOWER EXTREMITY;  Surgeon: Serene Gaile ORN, MD;  Location: [facility];  Service: Cardiovascular;  Laterality: N/A;   AMPUTATION Left 07/03/2019   Procedure: LEFT FOOT 4TH AND 5TH RAY AMPUTATION;  Surgeon: Harden Jerona GAILS, MD;  Location: [facility] OR;  Service:  Orthopedics;  Laterality: Left;   APPENDECTOMY     CORONARY STENT INTERVENTION N/A 10/24/2023   Procedure: CORONARY STENT INTERVENTION;  Surgeon: Anner Alm ORN, MD;  [facility]: [facility];  Service: Cardiovascular;  Laterality: N/A;   FEMORAL-POPLITEAL BYPASS GRAFT Left 02/04/2022   Procedure: LEFT FEMORAL-POPLITEAL ARTERY BYPASS;  Surgeon: Serene Gaile ORN, MD;  Location: [facility];  Service: Vascular;  Laterality: Left;   INSERTION OF DIALYSIS CATHETER  11/13   removed in 4-6 weeks   IR ANGIO INTRA EXTRACRAN SEL  COM CAROTID INNOMINATE BILAT MOD SED  02/07/2019   IR ANGIO VERTEBRAL SEL VERTEBRAL BILAT MOD SED  02/07/2019   IR ANGIOGRAM EXTREMITY LEFT  02/07/2019   IR ANGIOGRAM EXTREMITY RIGHT  02/20/2019   IR TRANSCATH EXCRAN VERT OR CAR A STENT  02/20/2019   IR TRANSCATH EXCRAN VERT OR CAR A STENT  03/25/2019   IR US  GUIDE VASC ACCESS RIGHT  02/07/2019   LEFT HEART CATH AND CORONARY ANGIOGRAPHY N/A 10/20/2023   Procedure: LEFT HEART CATH AND CORONARY ANGIOGRAPHY;  Surgeon: Wendel Lurena POUR, MD;  [facility]: [facility];  Service: Cardiovascular;  Laterality: N/A;   MULTIPLE EXTRACTIONS WITH ALVEOLOPLASTY N/A 02/24/2014   Procedure: MULTIPLE EXTRACTION WITH ALVEOLOPLASTY AND BIOPSY;  Surgeon: Glendia CHRISTELLA Primrose, DDS;  Location: [facility];  Service: Oral Surgery;  Laterality: N/A;   PERIPHERAL VASCULAR ATHERECTOMY Left 06/25/2019   Procedure: PERIPHERAL VASCULAR ATHERECTOMY;  Surgeon: Serene Gaile ORN, MD;  [facility]: [facility];  Service: Cardiovascular;  Laterality: Left;  superficial femoral   PERIPHERAL VASCULAR BALLOON ANGIOPLASTY Left 10/22/2019   Procedure: PERIPHERAL VASCULAR BALLOON ANGIOPLASTY;  Surgeon: Serene Gaile ORN, MD;  [facility]: [facility];  Service: Cardiovascular;  Laterality: Left;  superficial femoral   PERIPHERAL VASCULAR INTERVENTION  04/01/2020   Procedure: PERIPHERAL VASCULAR INTERVENTION;  Surgeon: Gretta Lonni PARAS, MD;  [facility];  Service: Cardiovascular;;  left SFA stent    RADIOLOGY WITH ANESTHESIA N/A 02/20/2019   Procedure: RADIOLOGY WITH ANESTHESIA   STENTING;  Surgeon: Dolphus Carrion, MD;  Location: [facility];  Service: Radiology;  Laterality: N/A;   RADIOLOGY WITH ANESTHESIA N/A 03/25/2019   Procedure: RADIOLOGY WITH ANESTHESIA STENTING;  Surgeon: Dolphus Carrion, MD;  Location: [facility];  Service: Radiology;  Laterality: N/A;   SKIN SPLIT GRAFT Left 07/03/2019   Procedure: APPLY SKIN GRAFT LEFT FOOT;  Surgeon: Harden Jerona GAILS, MD;  Location: [facility] OR;   Service: Orthopedics;  Laterality: Left;   TRACHEOSTOMY  11/13   closed 1/14   TRANSRECTAL DRAINAGE OF PELVIC ABSCESS  12/11   VASCULAR SURGERY  04/01/2020   two stents placed in left leg    Current Outpatient Medications  Medication Sig Dispense Refill   aspirin  81 MG EC tablet Take 81 mg by mouth daily.      atorvastatin  (LIPITOR ) 80 MG tablet TAKE 1 TABLET BY MOUTH DAILY 30 tablet 0   blood glucose meter kit and supplies Dispense based on patient and insurance preference. Use up to four times daily as directed. (FOR ICD-10 E10.9, E11.9). 1 each 0   cyclobenzaprine (FLEXERIL) 10 MG tablet Take 10 mg by mouth 3 (three) times daily.     dapagliflozin  propanediol (FARXIGA ) 10 MG TABS tablet Take 10 mg by mouth daily.      diazepam  (VALIUM ) 5 MG tablet Take 5 mg by mouth 2 (two) times daily.      ferrous gluconate  (FERGON) 324 MG tablet Take 324 mg by  mouth daily.     FLUoxetine  (PROZAC ) 10 MG capsule Take 10 mg by mouth in the morning.     HYDROcodone -acetaminophen  (NORCO/VICODIN) 5-325 MG tablet Take 1 tablet by mouth every 6 (six) hours as needed for severe pain. 20 tablet 0   hydrOXYzine  (VISTARIL ) 25 MG capsule Take 25 mg by mouth 3 (three) times daily.     ivabradine  (CORLANOR ) 5 MG TABS tablet Take 1 tablet (5 mg total) by mouth 2 (two) times daily. 60 tablet 6   levETIRAcetam  (KEPPRA  XR) 500 MG 24 hr tablet Take 2 tablets (1,000 mg total) by mouth daily. 180 tablet 3   magnesium  oxide (MAG-OX) 400 MG tablet Take 400 mg by mouth daily.     metFORMIN  (GLUCOPHAGE ) 500 MG tablet Take 500 mg by mouth 3 (three) times daily.     metoprolol  succinate (TOPROL -XL) 25 MG 24 hr tablet Take 0.5 tablets (12.5 mg total) by mouth daily. 45 tablet 0   nitroGLYCERIN  (NITROSTAT ) 0.4 MG SL tablet Place 1 tablet (0.4 mg total) under the tongue every 5 (five) minutes as needed for chest pain. 90 tablet 3   pantoprazole  (PROTONIX ) 40 MG tablet Take 1 tablet (40 mg total) by mouth daily. 30 tablet 0    pregabalin  (LYRICA ) 150 MG capsule TAKE 1 CAPSULE BY MOUTH THREE TIMES DAILY 90 capsule 5   ranolazine  (RANEXA ) 500 MG 12 hr tablet Take 1 tablet (500 mg total) by mouth 2 (two) times daily. 180 tablet 0   risperiDONE  (RISPERDAL ) 0.5 MG tablet Take 0.5 mg by mouth at bedtime.     sacubitril -valsartan  (ENTRESTO ) 24-26 MG Take 1 tablet by mouth 2 (two) times daily. 60 tablet 6   ticagrelor  (BRILINTA ) 90 MG TABS tablet Take 1 tablet (90 mg total) by mouth 2 (two) times daily. 180 tablet 0   tiZANidine  (ZANAFLEX ) 2 MG tablet TAKE 1 TABLET BY MOUTH AT BEDTIME AS NEEDED FOR MUSCLE SPASMS 30 tablet 2   traMADol  (ULTRAM ) 50 MG tablet Take 50 mg by mouth 4 (four) times daily.     traZODone  (DESYREL ) 100 MG tablet Take 200 mg by mouth at bedtime.      DULoxetine  (CYMBALTA ) 60 MG capsule Take 60 mg by mouth daily.     No current facility-administered medications for this visit.   Allergies:  Amitriptyline, Contrast media [iodinated contrast media], Morphine  and codeine, Promethazine , Compazine, Prochlorperazine maleate, Adhesive [tape], Oxycodone , and Sumatriptan   Social History: The patient  reports that she quit smoking about 4 years ago. Her smoking use included cigarettes. She started smoking about 27 years ago. She has a 13.8 pack-year smoking history. She has never been exposed to tobacco smoke. She has never used smokeless tobacco. She reports that she does not drink alcohol and does not use drugs.   Family History: The patient's family history includes Diabetes in her paternal grandfather; Heart attack in her maternal grandfather; Lung cancer in her maternal grandmother.   ROS:  Please see the history of present illness. Otherwise, complete review of systems is positive for none  All other systems are reviewed and negative.   Physical Exam: VS:  BP 115/74 (BP Location: Left Arm, Cuff Size: Large)   Pulse (!) 114   Ht 5' 4 (1.626 m)   Wt 212 lb (96.2 kg)   LMP 07/15/2023   SpO2 96%   BMI  36.39 kg/m , BMI Body mass index is 36.39 kg/m.  Wt Readings from Last 3 Encounters:  01/18/24 212 lb (96.2 kg)  12/07/23 220 lb (99.8 kg)  11/14/23 219 lb 1.6 oz (99.4 kg)    General: Patient appears comfortable at rest. HEENT: Conjunctiva and lids normal, oropharynx clear with moist mucosa. Neck: Supple, no elevated JVP or carotid bruits, no thyromegaly. Lungs: Clear to auscultation, nonlabored breathing at rest. Cardiac: Regular rate and rhythm, no S3 or significant systolic murmur, no pericardial rub. Abdomen: Soft, nontender, no hepatomegaly, bowel sounds present, no guarding or rebound. Extremities: No pitting edema, distal pulses 2+. Skin: Warm and dry. Musculoskeletal: No kyphosis. Neuropsychiatric: Alert and oriented x3, affect grossly appropriate.  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  BP soft.  Continue metoprolol  succinate 12.5 mg once daily and increase ranolazine  from 500 mg to 1000 mg twice daily.  SL NTG 0.4 mg as needed for chest pain. - Patient reports that she does not give account of chest pains.  says she has memory issues and hence her mother is here today to provide collateral history.  Encouraged her to keep a log and bring the log to the office visit. - Admitted for nonanginal pain but CT cardiac showed coronary calcium  score of 2837 (99th percentile for age and sex matched control), total plaque volume is extensive and  severe diffuse plaque throughout proximal LAD, LCx and RCA leading to downstream flow limitations in all 3 vessels.  She underwent LHC that showed severe multivessel CAD and LVEDP 38 mmHg.  CT surgery was consulted, not a candidate for CABG and hence underwent PCI of mid to distal Lcx. If she continues to have refractory chest pains to medical management, management would be debulking PCI of LAD crossing diagonal (not involving ostial LAD)--> distal LM into ostial Lcx + provisional PTCA/PCI of ostial LAD and medical management of RCA disease. - Continue DAPT, aspirin  81 mg once daily and Brilinta  90 mg twice daily.  Continue atorvastatin  80 mg nightly.  HLD, at goal - Continue atorvastatin  80 mg nightly. Goal LDL<55.LDL 47 in 10/2023.  Chronic systolic HF, compensated Ischemic cardiomyopathy with LVEF 35 to 40% - Continue metoprolol  succinate 12.5 mg once daily - Continue Entresto  24-26 mg BID. - Start spironolactone  12.5 mg once daily.  Sinus tachycardia likely secondary to severe anxiety - Strongly urged patient to make an appointment with PCP for management of anxiety. - Increase ivabradine  from 5 mg to 7.5 mg twice daily. - Patient asking for diazepam  and Norco refills.  Will with no refills.  prescribe 10 tablets of Norco.  She is on Norco's for back pain.  Morbid obesity -On Wegovy .  Previously she stated that she was on  Ozempic .  But currently Wegovy .   Complex decision making involved.   Medication Adjustments/Labs and Tests Ordered: Current medicines are reviewed at length with the patient today.  Concerns regarding medicines are outlined above.    Disposition:  Follow up 6 weeks  Signed Thailyn Khalid Priya Gerlad Pelzel, MD, 01/18/2024 10:32 AM  [facility] at [facility] [address] [place], [place], [place] [address]

## 2024-01-19 ENCOUNTER — Inpatient Hospital Stay (HOSPITAL_COMMUNITY)
Admission: EM | Admit: 2024-01-19 | Discharge: 2024-01-23 | DRG: 280 | Disposition: A | Discharge status: Home / Self Care | Source: Other Acute Inpatient Hospital | Attending: Internal Medicine | Admitting: Internal Medicine

## 2024-01-19 ENCOUNTER — Other Ambulatory Visit: Payer: Self-pay

## 2024-01-19 ENCOUNTER — Encounter (HOSPITAL_COMMUNITY): Payer: Self-pay

## 2024-01-19 ENCOUNTER — Encounter (HOSPITAL_COMMUNITY)

## 2024-01-19 ENCOUNTER — Inpatient Hospital Stay (HOSPITAL_COMMUNITY)

## 2024-01-19 ENCOUNTER — Inpatient Hospital Stay (HOSPITAL_COMMUNITY)
Admission: EM | Disposition: A | Discharge status: Home / Self Care | Payer: Self-pay | Source: Other Acute Inpatient Hospital | Attending: Internal Medicine

## 2024-01-19 ENCOUNTER — Encounter (HOSPITAL_COMMUNITY): Payer: Self-pay | Admitting: Hospitalist

## 2024-01-19 DIAGNOSIS — Z888 Allergy status to other drugs, medicaments and biological substances status: Secondary | ICD-10-CM

## 2024-01-19 DIAGNOSIS — K219 Gastro-esophageal reflux disease without esophagitis: Secondary | ICD-10-CM | POA: Diagnosis present

## 2024-01-19 DIAGNOSIS — W19XXXA Unspecified fall, initial encounter: Secondary | ICD-10-CM | POA: Diagnosis present

## 2024-01-19 DIAGNOSIS — Z7984 Long term (current) use of oral hypoglycemic drugs: Secondary | ICD-10-CM

## 2024-01-19 DIAGNOSIS — I5022 Chronic systolic (congestive) heart failure: Secondary | ICD-10-CM | POA: Diagnosis present

## 2024-01-19 DIAGNOSIS — Z89422 Acquired absence of other left toe(s): Secondary | ICD-10-CM

## 2024-01-19 DIAGNOSIS — Z8249 Family history of ischemic heart disease and other diseases of the circulatory system: Secondary | ICD-10-CM

## 2024-01-19 DIAGNOSIS — J189 Pneumonia, unspecified organism: Principal | ICD-10-CM | POA: Diagnosis present

## 2024-01-19 DIAGNOSIS — E1151 Type 2 diabetes mellitus with diabetic peripheral angiopathy without gangrene: Secondary | ICD-10-CM | POA: Diagnosis present

## 2024-01-19 DIAGNOSIS — Z87891 Personal history of nicotine dependence: Secondary | ICD-10-CM

## 2024-01-19 DIAGNOSIS — I959 Hypotension, unspecified: Secondary | ICD-10-CM | POA: Diagnosis not present

## 2024-01-19 DIAGNOSIS — Z8673 Personal history of transient ischemic attack (TIA), and cerebral infarction without residual deficits: Secondary | ICD-10-CM

## 2024-01-19 DIAGNOSIS — R570 Cardiogenic shock: Secondary | ICD-10-CM

## 2024-01-19 DIAGNOSIS — Z7985 Long-term (current) use of injectable non-insulin antidiabetic drugs: Secondary | ICD-10-CM | POA: Diagnosis not present

## 2024-01-19 DIAGNOSIS — Z885 Allergy status to narcotic agent status: Secondary | ICD-10-CM

## 2024-01-19 DIAGNOSIS — I110 Hypertensive heart disease with heart failure: Secondary | ICD-10-CM | POA: Diagnosis present

## 2024-01-19 DIAGNOSIS — Z833 Family history of diabetes mellitus: Secondary | ICD-10-CM

## 2024-01-19 DIAGNOSIS — D638 Anemia in other chronic diseases classified elsewhere: Secondary | ICD-10-CM | POA: Diagnosis present

## 2024-01-19 DIAGNOSIS — I2510 Atherosclerotic heart disease of native coronary artery without angina pectoris: Secondary | ICD-10-CM | POA: Diagnosis present

## 2024-01-19 DIAGNOSIS — I214 Non-ST elevation (NSTEMI) myocardial infarction: Secondary | ICD-10-CM | POA: Diagnosis present

## 2024-01-19 DIAGNOSIS — Y92009 Unspecified place in unspecified non-institutional (private) residence as the place of occurrence of the external cause: Secondary | ICD-10-CM | POA: Diagnosis not present

## 2024-01-19 DIAGNOSIS — F319 Bipolar disorder, unspecified: Secondary | ICD-10-CM | POA: Diagnosis present

## 2024-01-19 DIAGNOSIS — I255 Ischemic cardiomyopathy: Secondary | ICD-10-CM | POA: Diagnosis present

## 2024-01-19 DIAGNOSIS — G40909 Epilepsy, unspecified, not intractable, without status epilepticus: Secondary | ICD-10-CM | POA: Diagnosis present

## 2024-01-19 DIAGNOSIS — S2242XA Multiple fractures of ribs, left side, initial encounter for closed fracture: Secondary | ICD-10-CM | POA: Diagnosis present

## 2024-01-19 DIAGNOSIS — Z7902 Long term (current) use of antithrombotics/antiplatelets: Secondary | ICD-10-CM

## 2024-01-19 DIAGNOSIS — E785 Hyperlipidemia, unspecified: Secondary | ICD-10-CM | POA: Diagnosis present

## 2024-01-19 DIAGNOSIS — Z91041 Radiographic dye allergy status: Secondary | ICD-10-CM

## 2024-01-19 DIAGNOSIS — Z801 Family history of malignant neoplasm of trachea, bronchus and lung: Secondary | ICD-10-CM

## 2024-01-19 DIAGNOSIS — F419 Anxiety disorder, unspecified: Secondary | ICD-10-CM | POA: Diagnosis present

## 2024-01-19 DIAGNOSIS — Z6835 Body mass index (BMI) 35.0-35.9, adult: Secondary | ICD-10-CM

## 2024-01-19 DIAGNOSIS — E669 Obesity, unspecified: Secondary | ICD-10-CM | POA: Diagnosis present

## 2024-01-19 DIAGNOSIS — Z79899 Other long term (current) drug therapy: Secondary | ICD-10-CM

## 2024-01-19 DIAGNOSIS — Z7982 Long term (current) use of aspirin: Secondary | ICD-10-CM

## 2024-01-19 DIAGNOSIS — Z743 Need for continuous supervision: Secondary | ICD-10-CM | POA: Diagnosis not present

## 2024-01-19 DIAGNOSIS — R079 Chest pain, unspecified: Secondary | ICD-10-CM | POA: Diagnosis not present

## 2024-01-19 DIAGNOSIS — G8929 Other chronic pain: Secondary | ICD-10-CM | POA: Diagnosis present

## 2024-01-19 DIAGNOSIS — Z955 Presence of coronary angioplasty implant and graft: Secondary | ICD-10-CM

## 2024-01-19 DIAGNOSIS — Z91048 Other nonmedicinal substance allergy status: Secondary | ICD-10-CM

## 2024-01-19 DIAGNOSIS — R0689 Other abnormalities of breathing: Secondary | ICD-10-CM | POA: Diagnosis not present

## 2024-01-19 HISTORY — PX: RIGHT HEART CATH: CATH118263

## 2024-01-19 HISTORY — PX: LEFT HEART CATH AND CORONARY ANGIOGRAPHY: CATH118249

## 2024-01-19 LAB — CBC
HCT: 29.1 % — ABNORMAL LOW (ref 36.0–46.0)
Hemoglobin: 8.2 g/dL — ABNORMAL LOW (ref 12.0–15.0)
MCH: 19.2 pg — ABNORMAL LOW (ref 26.0–34.0)
MCHC: 28.2 g/dL — ABNORMAL LOW (ref 30.0–36.0)
MCV: 68.1 fL — ABNORMAL LOW (ref 80.0–100.0)
Platelets: 233 K/uL (ref 150–400)
RBC: 4.27 MIL/uL (ref 3.87–5.11)
RDW: 19.4 % — ABNORMAL HIGH (ref 11.5–15.5)
WBC: 21.7 K/uL — ABNORMAL HIGH (ref 4.0–10.5)
nRBC: 0 % (ref 0.0–0.2)

## 2024-01-19 LAB — LACTIC ACID, PLASMA: Lactic Acid, Venous: 1 mmol/L (ref 0.5–1.9)

## 2024-01-19 LAB — POCT I-STAT 7, (LYTES, BLD GAS, ICA,H+H)
Acid-base deficit: 3 mmol/L — ABNORMAL HIGH (ref 0.0–2.0)
Bicarbonate: 22.4 mmol/L (ref 20.0–28.0)
Calcium, Ion: 1.2 mmol/L (ref 1.15–1.40)
HCT: 28 % — ABNORMAL LOW (ref 36.0–46.0)
Hemoglobin: 9.5 g/dL — ABNORMAL LOW (ref 12.0–15.0)
O2 Saturation: 99 %
Potassium: 4.1 mmol/L (ref 3.5–5.1)
Sodium: 137 mmol/L (ref 135–145)
TCO2: 24 mmol/L (ref 22–32)
pCO2 arterial: 40.1 mmHg (ref 32–48)
pH, Arterial: 7.355 (ref 7.35–7.45)
pO2, Arterial: 131 mmHg — ABNORMAL HIGH (ref 83–108)

## 2024-01-19 LAB — POCT I-STAT EG7
Acid-base deficit: 1 mmol/L (ref 0.0–2.0)
Acid-base deficit: 2 mmol/L (ref 0.0–2.0)
Bicarbonate: 23.7 mmol/L (ref 20.0–28.0)
Bicarbonate: 24.6 mmol/L (ref 20.0–28.0)
Calcium, Ion: 1.18 mmol/L (ref 1.15–1.40)
Calcium, Ion: 1.2 mmol/L (ref 1.15–1.40)
HCT: 28 % — ABNORMAL LOW (ref 36.0–46.0)
HCT: 28 % — ABNORMAL LOW (ref 36.0–46.0)
Hemoglobin: 9.5 g/dL — ABNORMAL LOW (ref 12.0–15.0)
Hemoglobin: 9.5 g/dL — ABNORMAL LOW (ref 12.0–15.0)
O2 Saturation: 62 %
O2 Saturation: 69 %
Potassium: 4 mmol/L (ref 3.5–5.1)
Potassium: 4 mmol/L (ref 3.5–5.1)
Sodium: 137 mmol/L (ref 135–145)
Sodium: 137 mmol/L (ref 135–145)
TCO2: 25 mmol/L (ref 22–32)
TCO2: 26 mmol/L (ref 22–32)
pCO2, Ven: 46.3 mmHg (ref 44–60)
pCO2, Ven: 46.4 mmHg (ref 44–60)
pH, Ven: 7.318 (ref 7.25–7.43)
pH, Ven: 7.332 (ref 7.25–7.43)
pO2, Ven: 35 mmHg (ref 32–45)
pO2, Ven: 39 mmHg (ref 32–45)

## 2024-01-19 LAB — MRSA NEXT GEN BY PCR, NASAL: MRSA by PCR Next Gen: NOT DETECTED

## 2024-01-19 LAB — COMPREHENSIVE METABOLIC PANEL WITH GFR
ALT: 24 U/L (ref 0–44)
AST: 71 U/L — ABNORMAL HIGH (ref 15–41)
Albumin: 3 g/dL — ABNORMAL LOW (ref 3.5–5.0)
Alkaline Phosphatase: 65 U/L (ref 38–126)
Anion gap: 11 (ref 5–15)
BUN: 13 mg/dL (ref 6–20)
CO2: 23 mmol/L (ref 22–32)
Calcium: 8.6 mg/dL — ABNORMAL LOW (ref 8.9–10.3)
Chloride: 103 mmol/L (ref 98–111)
Creatinine, Ser: 0.97 mg/dL (ref 0.44–1.00)
GFR, Estimated: >60 mL/min (ref >60)
Glucose, Bld: 221 mg/dL — ABNORMAL HIGH (ref 70–99)
Potassium: 3.8 mmol/L (ref 3.5–5.1)
Sodium: 137 mmol/L (ref 135–145)
Total Bilirubin: 0.7 mg/dL (ref 0.0–1.2)
Total Protein: 6.6 g/dL (ref 6.5–8.1)

## 2024-01-19 LAB — BRAIN NATRIURETIC PEPTIDE: B Natriuretic Peptide: 385.9 pg/mL — ABNORMAL HIGH (ref 0.0–100.0)

## 2024-01-19 LAB — TROPONIN I (HIGH SENSITIVITY): Troponin I (High Sensitivity): 7390 ng/L (ref <18)

## 2024-01-19 LAB — COOXEMETRY PANEL
Carboxyhemoglobin: 1.3 % (ref 0.5–1.5)
Methemoglobin: <0.7 % (ref 0.0–1.5)
O2 Saturation: 68.7 %
Total hemoglobin: 8.4 g/dL — ABNORMAL LOW (ref 12.0–16.0)

## 2024-01-19 LAB — GLUCOSE, CAPILLARY
Glucose-Capillary: 211 mg/dL — ABNORMAL HIGH (ref 70–99)
Glucose-Capillary: 243 mg/dL — ABNORMAL HIGH (ref 70–99)

## 2024-01-19 LAB — MAGNESIUM: Magnesium: 2 mg/dL (ref 1.7–2.4)

## 2024-01-19 LAB — CG4 I-STAT (LACTIC ACID): Lactic Acid, Venous: 0.6 mmol/L (ref 0.5–1.9)

## 2024-01-19 SURGERY — LEFT HEART CATH AND CORONARY ANGIOGRAPHY
Anesthesia: LOCAL

## 2024-01-19 MED ORDER — HYDROCODONE-ACETAMINOPHEN 5-325 MG PO TABS
1.0000 | ORAL_TABLET | Freq: Four times a day (QID) | ORAL | Status: DC | PRN
  Administered 2024-01-19 – 2024-01-20 (×3): 1 via ORAL
  Filled 2024-01-19 (×3): qty 1

## 2024-01-19 MED ORDER — HEPARIN SODIUM (PORCINE) 1000 UNIT/ML IJ SOLN
INTRAMUSCULAR | Status: AC
  Filled 2024-01-19: qty 10

## 2024-01-19 MED ORDER — DIPHENHYDRAMINE HCL 50 MG/ML IJ SOLN
INTRAMUSCULAR | Status: AC
  Filled 2024-01-19: qty 1

## 2024-01-19 MED ORDER — METHOCARBAMOL 500 MG PO TABS
500.0000 mg | ORAL_TABLET | Freq: Three times a day (TID) | ORAL | Status: DC
  Administered 2024-01-19 – 2024-01-20 (×2): 500 mg via ORAL
  Filled 2024-01-19 (×2): qty 1

## 2024-01-19 MED ORDER — METOPROLOL SUCCINATE ER 25 MG PO TB24
12.5000 mg | ORAL_TABLET | Freq: Every day | ORAL | Status: DC
  Administered 2024-01-20 – 2024-01-23 (×6): 12.5 mg via ORAL
  Filled 2024-01-19 (×4): qty 1

## 2024-01-19 MED ORDER — OXYCODONE HCL 5 MG PO TABS: 5.0000 mg | ORAL_TABLET | ORAL | Status: DC | PRN

## 2024-01-19 MED ORDER — SODIUM CHLORIDE 0.9 % IV SOLN
250.0000 mL | INTRAVENOUS | Status: AC | PRN
Start: 2024-01-19 — End: 2024-01-20

## 2024-01-19 MED ORDER — IVABRADINE HCL 5 MG PO TABS
7.5000 mg | ORAL_TABLET | Freq: Two times a day (BID) | ORAL | Status: DC
  Administered 2024-01-20: 7.5 mg via ORAL
  Filled 2024-01-19: qty 1

## 2024-01-19 MED ORDER — HEPARIN (PORCINE) 25000 UT/250ML-% IV SOLN
1650.0000 [IU]/h | INTRAVENOUS | Status: AC
  Administered 2024-01-20: 1200 [IU]/h via INTRAVENOUS
  Administered 2024-01-20: 1000 [IU]/h via INTRAVENOUS
  Administered 2024-01-21: 1650 [IU]/h via INTRAVENOUS
  Filled 2024-01-19 (×3): qty 250

## 2024-01-19 MED ORDER — DULOXETINE HCL 60 MG PO CPEP: 60.0000 mg | ORAL_CAPSULE | Freq: Every day | ORAL | Status: DC

## 2024-01-19 MED ORDER — IOHEXOL 350 MG/ML SOLN
INTRAVENOUS | Status: DC | PRN
  Administered 2024-01-19: 60 mL

## 2024-01-19 MED ORDER — TRAMADOL HCL 50 MG PO TABS
50.0000 mg | ORAL_TABLET | Freq: Four times a day (QID) | ORAL | Status: DC | PRN
  Administered 2024-01-19: 50 mg via ORAL
  Administered 2024-01-20 (×2): 100 mg via ORAL
  Administered 2024-01-21: 50 mg via ORAL
  Administered 2024-01-21: 100 mg via ORAL
  Administered 2024-01-21 – 2024-01-22 (×3): 50 mg via ORAL
  Administered 2024-01-22 (×2): 100 mg via ORAL
  Administered 2024-01-23 (×2): 50 mg via ORAL
  Filled 2024-01-19: qty 1
  Filled 2024-01-19 (×4): qty 2
  Filled 2024-01-19: qty 1
  Filled 2024-01-19 (×2): qty 2

## 2024-01-19 MED ORDER — ASPIRIN 81 MG PO CHEW
CHEWABLE_TABLET | ORAL | Status: DC | PRN
  Administered 2024-01-19: 81 mg via ORAL

## 2024-01-19 MED ORDER — LIDOCAINE HCL (PF) 1 % IJ SOLN
INTRAMUSCULAR | Status: AC
  Filled 2024-01-19: qty 30

## 2024-01-19 MED ORDER — HYDROCODONE-ACETAMINOPHEN 5-325 MG PO TABS
1.0000 | ORAL_TABLET | Freq: Four times a day (QID) | ORAL | Status: DC | PRN
  Administered 2024-01-19: 1 via ORAL
  Filled 2024-01-19: qty 1

## 2024-01-19 MED ORDER — FREE WATER: 250.0000 mL | Freq: Once | Status: DC

## 2024-01-19 MED ORDER — FENTANYL CITRATE (PF) 100 MCG/2ML IJ SOLN
INTRAMUSCULAR | Status: DC | PRN
  Administered 2024-01-19: 25 ug via INTRAVENOUS

## 2024-01-19 MED ORDER — ONDANSETRON HCL 4 MG/2ML IJ SOLN: 4.0000 mg | Freq: Four times a day (QID) | INTRAMUSCULAR | Status: DC | PRN

## 2024-01-19 MED ORDER — LEVETIRACETAM ER 500 MG PO TB24
1000.0000 mg | ORAL_TABLET | Freq: Every day | ORAL | Status: DC
  Administered 2024-01-20 – 2024-01-23 (×6): 1000 mg via ORAL
  Filled 2024-01-19 (×5): qty 2

## 2024-01-19 MED ORDER — FUROSEMIDE 10 MG/ML IJ SOLN: 10.0000 mg | Freq: Once | INTRAMUSCULAR | Status: DC

## 2024-01-19 MED ORDER — TRAZODONE HCL 100 MG PO TABS
200.0000 mg | ORAL_TABLET | Freq: Every day | ORAL | Status: DC
  Administered 2024-01-19 – 2024-01-22 (×5): 200 mg via ORAL
  Filled 2024-01-19 (×3): qty 2
  Filled 2024-01-19: qty 4

## 2024-01-19 MED ORDER — SACUBITRIL-VALSARTAN 24-26 MG PO TABS
1.0000 | ORAL_TABLET | Freq: Two times a day (BID) | ORAL | Status: DC
  Filled 2024-01-19 (×2): qty 1

## 2024-01-19 MED ORDER — SODIUM CHLORIDE 0.9 % IV SOLN
1.0000 g | INTRAVENOUS | Status: AC
  Administered 2024-01-20 – 2024-01-23 (×6): 1 g via INTRAVENOUS
  Filled 2024-01-19 (×4): qty 10

## 2024-01-19 MED ORDER — NOREPINEPHRINE 4 MG/250ML-% IV SOLN
INTRAVENOUS | Status: AC
  Filled 2024-01-19: qty 250

## 2024-01-19 MED ORDER — RANOLAZINE ER 500 MG PO TB12
1000.0000 mg | ORAL_TABLET | Freq: Two times a day (BID) | ORAL | Status: DC
  Administered 2024-01-19 – 2024-01-23 (×11): 1000 mg via ORAL
  Filled 2024-01-19 (×9): qty 2

## 2024-01-19 MED ORDER — HEPARIN BOLUS VIA INFUSION
4000.0000 [IU] | Freq: Once | INTRAVENOUS | Status: DC
  Filled 2024-01-19: qty 4000

## 2024-01-19 MED ORDER — ACETAMINOPHEN 325 MG PO TABS
650.0000 mg | ORAL_TABLET | ORAL | Status: DC | PRN
Start: 2024-01-19 — End: 2024-01-23

## 2024-01-19 MED ORDER — SPIRONOLACTONE 12.5 MG HALF TABLET: 12.5000 mg | ORAL_TABLET | Freq: Every day | ORAL | Status: DC

## 2024-01-19 MED ORDER — SODIUM CHLORIDE 0.9% FLUSH
3.0000 mL | INTRAVENOUS | Status: DC | PRN
Start: 2024-01-19 — End: 2024-01-23
  Administered 2024-01-20: 3 mL via INTRAVENOUS

## 2024-01-19 MED ORDER — SODIUM CHLORIDE 0.9 % IV SOLN: INTRAVENOUS | Status: AC

## 2024-01-19 MED ORDER — HYDROXYZINE HCL 25 MG PO TABS
25.0000 mg | ORAL_TABLET | Freq: Three times a day (TID) | ORAL | Status: DC
  Administered 2024-01-19 – 2024-01-23 (×15): 25 mg via ORAL
  Filled 2024-01-19 (×11): qty 1

## 2024-01-19 MED ORDER — NITROGLYCERIN 0.4 MG SL SUBL
0.4000 mg | SUBLINGUAL_TABLET | SUBLINGUAL | Status: DC | PRN
  Administered 2024-01-19 (×2): 0.4 mg via SUBLINGUAL

## 2024-01-19 MED ORDER — HYDRALAZINE HCL 20 MG/ML IJ SOLN: 10.0000 mg | INTRAMUSCULAR | Status: AC | PRN

## 2024-01-19 MED ORDER — SODIUM CHLORIDE 0.9% FLUSH
3.0000 mL | Freq: Two times a day (BID) | INTRAVENOUS | Status: DC
  Administered 2024-01-19 – 2024-01-23 (×11): 3 mL via INTRAVENOUS

## 2024-01-19 MED ORDER — DIPHENHYDRAMINE HCL 50 MG/ML IJ SOLN
25.0000 mg | Freq: Once | INTRAMUSCULAR | Status: AC
  Administered 2024-01-19: 25 mg via INTRAVENOUS

## 2024-01-19 MED ORDER — ASPIRIN 81 MG PO CHEW
CHEWABLE_TABLET | ORAL | Status: AC
  Filled 2024-01-19: qty 1

## 2024-01-19 MED ORDER — VERAPAMIL HCL 2.5 MG/ML IV SOLN
INTRAVENOUS | Status: DC | PRN
  Administered 2024-01-19: 10 mL via INTRA_ARTERIAL

## 2024-01-19 MED ORDER — ATORVASTATIN CALCIUM 80 MG PO TABS
80.0000 mg | ORAL_TABLET | Freq: Every day | ORAL | Status: DC
  Administered 2024-01-20 – 2024-01-23 (×6): 80 mg via ORAL
  Filled 2024-01-19 (×4): qty 1

## 2024-01-19 MED ORDER — VERAPAMIL HCL 2.5 MG/ML IV SOLN
INTRAVENOUS | Status: AC
  Filled 2024-01-19: qty 2

## 2024-01-19 MED ORDER — ENOXAPARIN SODIUM 40 MG/0.4ML IJ SOSY: 40.0000 mg | PREFILLED_SYRINGE | INTRAMUSCULAR | Status: DC

## 2024-01-19 MED ORDER — MIDAZOLAM HCL 2 MG/2ML IJ SOLN
INTRAMUSCULAR | Status: DC | PRN
  Administered 2024-01-19: 1 mg via INTRAVENOUS

## 2024-01-19 MED ORDER — FENTANYL CITRATE (PF) 100 MCG/2ML IJ SOLN
INTRAMUSCULAR | Status: AC
  Filled 2024-01-19: qty 2

## 2024-01-19 MED ORDER — TRAMADOL HCL 50 MG PO TABS
50.0000 mg | ORAL_TABLET | Freq: Four times a day (QID) | ORAL | Status: DC | PRN
  Filled 2024-01-19: qty 2

## 2024-01-19 MED ORDER — NOREPINEPHRINE BITARTRATE 1 MG/ML IV SOLN
INTRAVENOUS | Status: DC | PRN
  Administered 2024-01-19: 5 ug/min via INTRAVENOUS

## 2024-01-19 MED ORDER — HEPARIN (PORCINE) 25000 UT/250ML-% IV SOLN: 1000.0000 [IU]/h | INTRAVENOUS | Status: DC

## 2024-01-19 MED ORDER — HEPARIN (PORCINE) IN NACL 1000-0.9 UT/500ML-% IV SOLN
INTRAVENOUS | Status: DC | PRN
  Administered 2024-01-19 (×3): 500 mL

## 2024-01-19 MED ORDER — BUSPIRONE HCL 5 MG PO TABS: 5.0000 mg | ORAL_TABLET | Freq: Three times a day (TID) | ORAL | Status: DC

## 2024-01-19 MED ORDER — TICAGRELOR 90 MG PO TABS
90.0000 mg | ORAL_TABLET | Freq: Two times a day (BID) | ORAL | Status: DC
  Administered 2024-01-19 – 2024-01-23 (×11): 90 mg via ORAL
  Filled 2024-01-19 (×8): qty 1

## 2024-01-19 MED ORDER — DIPHENHYDRAMINE HCL 25 MG PO CAPS: 25.0000 mg | ORAL_CAPSULE | Freq: Once | ORAL | Status: DC

## 2024-01-19 MED ORDER — HYDROXYZINE PAMOATE 25 MG PO CAPS
25.0000 mg | ORAL_CAPSULE | Freq: Three times a day (TID) | ORAL | Status: DC
  Filled 2024-01-19 (×2): qty 1

## 2024-01-19 MED ORDER — TICAGRELOR 90 MG PO TABS: 90.0000 mg | ORAL_TABLET | Freq: Two times a day (BID) | ORAL | Status: DC

## 2024-01-19 MED ORDER — PANTOPRAZOLE SODIUM 40 MG PO TBEC
40.0000 mg | DELAYED_RELEASE_TABLET | Freq: Every day | ORAL | Status: DC
  Administered 2024-01-20 – 2024-01-23 (×6): 40 mg via ORAL
  Filled 2024-01-19 (×4): qty 1

## 2024-01-19 MED ORDER — RISPERIDONE 0.5 MG PO TABS
0.5000 mg | ORAL_TABLET | Freq: Every day | ORAL | Status: DC
Start: 2024-01-19 — End: 2024-01-23
  Administered 2024-01-20 – 2024-01-22 (×5): 0.5 mg via ORAL
  Filled 2024-01-19 (×8): qty 1

## 2024-01-19 MED ORDER — MIDAZOLAM HCL 2 MG/2ML IJ SOLN
INTRAMUSCULAR | Status: AC
  Filled 2024-01-19: qty 2

## 2024-01-19 MED ORDER — FLUOXETINE HCL 20 MG PO CAPS
20.0000 mg | ORAL_CAPSULE | Freq: Every morning | ORAL | Status: DC
  Administered 2024-01-20 – 2024-01-23 (×6): 20 mg via ORAL
  Filled 2024-01-19 (×4): qty 1

## 2024-01-19 MED ORDER — HYDROXYZINE HCL 25 MG PO TABS: 25.0000 mg | ORAL_TABLET | Freq: Three times a day (TID) | ORAL | Status: DC

## 2024-01-19 MED ORDER — KETOROLAC TROMETHAMINE 15 MG/ML IJ SOLN: 15.0000 mg | Freq: Four times a day (QID) | INTRAMUSCULAR | Status: DC | PRN

## 2024-01-19 MED ORDER — METHYLPREDNISOLONE SODIUM SUCC 125 MG IJ SOLR
125.0000 mg | Freq: Once | INTRAMUSCULAR | Status: AC
  Administered 2024-01-19: 125 mg via INTRAVENOUS
  Filled 2024-01-19: qty 2

## 2024-01-19 MED ORDER — SODIUM CHLORIDE 0.9% IV SOLUTION: INTRAVENOUS | Status: DC

## 2024-01-19 MED ORDER — ASPIRIN 81 MG PO CHEW: 81.0000 mg | CHEWABLE_TABLET | Freq: Every day | ORAL | Status: DC

## 2024-01-19 MED ORDER — CHLORHEXIDINE GLUCONATE CLOTH 2 % EX PADS
6.0000 | MEDICATED_PAD | Freq: Every day | CUTANEOUS | Status: DC
  Administered 2024-01-19 – 2024-01-23 (×4): 6 via TOPICAL

## 2024-01-19 MED ORDER — AZITHROMYCIN 250 MG PO TABS
500.0000 mg | ORAL_TABLET | Freq: Every day | ORAL | Status: AC
  Administered 2024-01-20 – 2024-01-21 (×3): 500 mg via ORAL
  Filled 2024-01-19: qty 2
  Filled 2024-01-19 (×2): qty 1

## 2024-01-19 MED ORDER — NOREPINEPHRINE 4 MG/250ML-% IV SOLN
INTRAVENOUS | Status: AC
  Filled 2024-01-19: qty 500

## 2024-01-19 MED ORDER — ASPIRIN 81 MG PO TBEC
81.0000 mg | DELAYED_RELEASE_TABLET | Freq: Every day | ORAL | Status: DC
Start: 2024-01-19 — End: 2024-01-23
  Administered 2024-01-20 – 2024-01-23 (×6): 81 mg via ORAL
  Filled 2024-01-19 (×4): qty 1

## 2024-01-19 MED ORDER — HEPARIN SODIUM (PORCINE) 1000 UNIT/ML IJ SOLN
INTRAMUSCULAR | Status: DC | PRN
Start: 2024-01-19 — End: 2024-01-19
  Administered 2024-01-19: 5000 [IU] via INTRAVENOUS

## 2024-01-19 MED ORDER — NITROGLYCERIN 0.4 MG SL SUBL
SUBLINGUAL_TABLET | SUBLINGUAL | Status: AC
Start: 2024-01-19 — End: 2024-01-19
  Administered 2024-01-19: 0.4 mg via ORAL
  Filled 2024-01-19: qty 1

## 2024-01-19 MED ORDER — LIDOCAINE HCL (PF) 1 % IJ SOLN
INTRAMUSCULAR | Status: DC | PRN
Start: 2024-01-19 — End: 2024-01-19
  Administered 2024-01-19 (×2): 2 mL

## 2024-01-19 MED ORDER — LABETALOL HCL 5 MG/ML IV SOLN
10.0000 mg | INTRAVENOUS | Status: AC | PRN
Start: 2024-01-19 — End: 2024-01-20

## 2024-01-19 SURGICAL SUPPLY — 13 items
CATH INFINITI 5FR ANG PIGTAIL (CATHETERS) IMPLANT
CATH INFINITI AMBI 6FR TG (CATHETERS) IMPLANT
CATH SWAN GANZ 7F STRAIGHT (CATHETERS) IMPLANT
DEVICE RAD COMP TR BAND LRG (VASCULAR PRODUCTS) IMPLANT
GLIDESHEATH SLEND SS 6F .021 (SHEATH) IMPLANT
GUIDEWIRE INQWIRE 1.5J.035X260 (WIRE) IMPLANT
KIT MICROPUNCTURE NIT STIFF (SHEATH) IMPLANT
KIT SYRINGE INJ CVI SPIKEX1 (MISCELLANEOUS) IMPLANT
PACK CARDIAC CATHETERIZATION (CUSTOM PROCEDURE TRAY) ×2 IMPLANT
SET ATX-X65L (MISCELLANEOUS) IMPLANT
SHEATH PINNACLE 7F 10CM (SHEATH) IMPLANT
SHEATH PROBE COVER 6X72 (BAG) IMPLANT
WIRE EMERALD 3MM-J .035X260CM (WIRE) IMPLANT

## 2024-01-19 NOTE — Interval H&P Note (Signed)
 History and Physical Interval Note:  01/19/2024 5:36 PM  Taylor [name] Sar  has presented today for surgery, with the diagnosis of nstemi.  The various methods of treatment have been discussed with the patient and family. After consideration of risks, benefits and other options for treatment, the patient has consented to  Procedure(s): LEFT HEART CATH AND CORONARY ANGIOGRAPHY (N/A) as a surgical intervention.  The patient's history has been reviewed, patient examined, no change in status, stable for surgery.  I have reviewed the patient's chart and labs.  Questions were answered to the patient's satisfaction.     [name] K Tafari [place]

## 2024-01-19 NOTE — Plan of Care (Signed)
 Hospital Medicine Transfer Accept Note Patient Name/Age: Taylor [name] / 43 y.o. MRN: [id number] Admission Date: (Not on file)  Once successfully transferred to the appropriate floor, TRH will assume care for the patient above.  A/P: 45F p/w CAP and NSTEMI w/ elevated troponins >8000 (on hep gtt). Cards accepted pt to medicine, and will need to be consulted on admission. Continue hep gtt and CAP coverage on arrival as well.   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

## 2024-01-19 NOTE — Consults (Signed)
 43 YO female pt on heparin  drip for CP/ACS awaiting transfer.  Patient noted to also have PNA treated with ED doses of Vanc/Zosyn.  Ht: 5'5 Wt: 91.9 kg IBW: 57 kg ABW per policy formula: 71 kg  Lab Results  Component Value Date   WBC 16.9 (H) 01/19/2024   HGB 9.8 (L) 01/19/2024   HCT 35.2 01/19/2024   PLT 302 01/19/2024    Lab Results  Component Value Date   PT 12.3 01/19/2024   INR 1.11 01/19/2024   APTT 27.0 01/19/2024    Plan: Start heparin  3,000 unit IV x1 bolus and heparin  drip at 1,000 units/hr.  Anti-Xa in 6 hours with target level of 0.3 to 0.7.  Pharmacy will continue to monitor and adjust to target a therapeutic Anti-Xa level.

## 2024-01-19 NOTE — H&P (Addendum)
 History and Physical    Patient: Taylor [name] FMW:[id number] DOB: 1981/05/17 DOA: 01/19/2024 DOS: the patient was seen and examined on 01/19/2024 PCP: Maree Isles, MD  Patient coming from: Home  Chief Complaint: No chief complaint on file.  HPI: Taylor [name] is a 43 y.o. female with medical history significant of BPD, multivessel CAD, prior CVA, ischemic cardiomyopathy, seizure disorder, chronic pain, HTN, PAD, and DM2 p/w elevated troponin c/w NSTEMI after GLF at home.  Pt is a poor historian and has some noticeable cognitive decline. From what I can gather per brief interview, she reports getting out of bed for unclear reasons and falling. Pt was unable to get herself up, and was helped up by her father, with whom she resides, within minutes (pt was down for an unknown amount of time per signout bedside RN, Jolaine, received).  Of note, pt was admitted in 10/2023 and underwent LHC with multivessel disease, but CABG deferred as pt deemed poor surgical candidate; thus, pt underwent PCI w/ DES to mid/distal LCx.  In the OSH ED, pt AFVSS. Labs notable for troponin 200s-->8000s-->18000s. CTA chest showed left lung predominant peribronchovascular groundglass opacities and patchy consolidations, may represent pneumonia, pulmonary hemorrhage, and/or aspiration for which pt was started on IV abx. EDP started hep gtt for ACS and consulted [facility] who agreed with transfer to [facility] via medicine admission for evaluation.  Review of Systems: As mentioned in the history of present illness. All other systems reviewed and are negative. 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   due to elevated blood sugar 1700-off insulin  6 months   Depression    Diabetes mellitus    off insulin  6 months   Diverticulitis    Dyspnea    when walking   GERD (gastroesophageal reflux  disease)    Hypertension    Kidney dialysis status    on dialysis(stroke) off after 4-6 weeks   Pneumonia 2013, 06/2018   Respiratory failure (HCC) 04/2012   with stroke - in hosp [facility] 12 weeks   Seizures (HCC)    during elvated blood sugsr episode 11/13   Sepsis (HCC)    Stroke (HCC)    11/2018, 12/2018 Weak left side, speech- slurred, Short term memeroy, Gait unsteady    Vocal cord dysfunction    Past Surgical History:  Procedure Laterality Date   ABDOMINAL AORTOGRAM W/LOWER EXTREMITY N/A 06/25/2019   Procedure: ABDOMINAL AORTOGRAM W/LOWER EXTREMITY;  Surgeon: Serene Gaile ORN, MD;  Location: [facility] INVASIVE CV LAB;  Service: Cardiovascular;  Laterality: N/A;   ABDOMINAL AORTOGRAM W/LOWER EXTREMITY N/A 10/22/2019   Procedure: ABDOMINAL AORTOGRAM W/LOWER EXTREMITY;  Surgeon: Serene Gaile ORN, MD;  Location: [facility];  Service: Cardiovascular;  Laterality: N/A;   ABDOMINAL AORTOGRAM W/LOWER EXTREMITY Left 04/01/2020   Procedure: ABDOMINAL AORTOGRAM W/LOWER EXTREMITY;  Surgeon: Gretta Lonni PARAS, MD;  Location: [facility];  Service: Cardiovascular;  Laterality: Left;   ABDOMINAL AORTOGRAM W/LOWER EXTREMITY N/A 02/01/2022   Procedure: ABDOMINAL AORTOGRAM W/LOWER EXTREMITY;  Surgeon: Serene Gaile ORN, MD;  Location: [facility];  Service: Cardiovascular;  Laterality: N/A;   AMPUTATION Left 07/03/2019   Procedure: LEFT FOOT 4TH AND 5TH RAY AMPUTATION;  Surgeon: Harden Jerona GAILS, MD;  Location: [facility] OR;  Service: Orthopedics;  Laterality: Left;   APPENDECTOMY     CORONARY  STENT INTERVENTION N/A 10/24/2023   Procedure: CORONARY STENT INTERVENTION;  Surgeon: Anner Alm ORN, MD;  Location: [facility] INVASIVE CV LAB;  Service: Cardiovascular;  Laterality: N/A;   FEMORAL-POPLITEAL BYPASS GRAFT Left 02/04/2022   Procedure: LEFT FEMORAL-POPLITEAL ARTERY BYPASS;  Surgeon: Serene Gaile ORN, MD;  Location: [facility] OR;  Service: Vascular;  Laterality: Left;   INSERTION OF DIALYSIS CATHETER  11/13    removed in 4-6 weeks   IR ANGIO INTRA EXTRACRAN SEL COM CAROTID INNOMINATE BILAT MOD SED  02/07/2019   IR ANGIO VERTEBRAL SEL VERTEBRAL BILAT MOD SED  02/07/2019   IR ANGIOGRAM EXTREMITY LEFT  02/07/2019   IR ANGIOGRAM EXTREMITY RIGHT  02/20/2019   IR TRANSCATH EXCRAN VERT OR CAR A STENT  02/20/2019   IR TRANSCATH EXCRAN VERT OR CAR A STENT  03/25/2019   IR US  GUIDE VASC ACCESS RIGHT  02/07/2019   LEFT HEART CATH AND CORONARY ANGIOGRAPHY N/A 10/20/2023   Procedure: LEFT HEART CATH AND CORONARY ANGIOGRAPHY;  Surgeon: Wendel Lurena POUR, MD;  [facility]: [facility];  Service: Cardiovascular;  Laterality: N/A;   MULTIPLE EXTRACTIONS WITH ALVEOLOPLASTY N/A 02/24/2014   Procedure: MULTIPLE EXTRACTION WITH ALVEOLOPLASTY AND BIOPSY;  Surgeon: Glendia CHRISTELLA Primrose, DDS;  Location: [facility] OR;  Service: Oral Surgery;  Laterality: N/A;   PERIPHERAL VASCULAR ATHERECTOMY Left 06/25/2019   Procedure: PERIPHERAL VASCULAR ATHERECTOMY;  Surgeon: Serene Gaile ORN, MD;  [facility]: [facility];  Service: Cardiovascular;  Laterality: Left;  superficial femoral   PERIPHERAL VASCULAR BALLOON ANGIOPLASTY Left 10/22/2019   Procedure: PERIPHERAL VASCULAR BALLOON ANGIOPLASTY;  Surgeon: Serene Gaile ORN, MD;  [facility]: [facility];  Service: Cardiovascular;  Laterality: Left;  superficial femoral   PERIPHERAL VASCULAR INTERVENTION  04/01/2020   Procedure: PERIPHERAL VASCULAR INTERVENTION;  Surgeon: Gretta Lonni PARAS, MD;  [facility]: [facility];  Service: Cardiovascular;;  left SFA stent    RADIOLOGY WITH ANESTHESIA N/A 02/20/2019   Procedure: RADIOLOGY WITH ANESTHESIA   STENTING;  Surgeon: Dolphus Carrion, MD;  Location: [facility];  Service: Radiology;  Laterality: N/A;   RADIOLOGY WITH ANESTHESIA N/A 03/25/2019   Procedure: RADIOLOGY WITH ANESTHESIA STENTING;  Surgeon: Dolphus Carrion, MD;  Location: [facility] OR;  Service: Radiology;  Laterality: N/A;   SKIN SPLIT GRAFT Left 07/03/2019   Procedure: APPLY SKIN GRAFT LEFT  FOOT;  Surgeon: Harden Jerona GAILS, MD;  Location: [facility] OR;  Service: Orthopedics;  Laterality: Left;   TRACHEOSTOMY  11/13   closed 1/14   TRANSRECTAL DRAINAGE OF PELVIC ABSCESS  12/11   VASCULAR SURGERY  04/01/2020   two stents placed in left leg   Social History:  reports that she quit smoking about 4 years ago. Her smoking use included cigarettes. She started smoking about 27 years ago. She has a 13.8 pack-year smoking history. She has never been exposed to tobacco smoke. She has never used smokeless tobacco. She reports that she does not drink alcohol and does not use drugs.  Allergies  Allergen Reactions   Amitriptyline Other (See Comments)    Severe dissociative state   Contrast Media [Iodinated Contrast Media] Other (See Comments)    2013-had CVA, went into AFR-combination ABT, contrast, low BP   Morphine  And Codeine Other (See Comments)    Chest pain   Promethazine  Swelling    Lips swell   Compazine Other (See Comments)    -Alert but unable to move   Prochlorperazine Maleate Other (See Comments)    -Alert, but unable to move  Adhesive [Tape] Rash    Long term exposure causes skin to tear    Oxycodone  Nausea And Vomiting    Severe n/v -  patient will not take oxycodone  or percocet.   Sumatriptan Rash    Family History  Problem Relation Age of Onset   Lung cancer Maternal Grandmother    Heart attack Maternal Grandfather    Diabetes Paternal Grandfather     Prior to Admission medications   Medication Sig Start Date End Date Taking? Authorizing Provider  aspirin  81 MG EC tablet Take 81 mg by mouth daily.    Yes [provider]  atorvastatin  (LIPITOR ) 80 MG tablet TAKE 1 TABLET BY MOUTH DAILY 01/10/23  Yes Sheree Penne Bruckner, MD  busPIRone  (BUSPAR ) 5 MG tablet Take 5 mg by mouth 3 (three) times daily. 12/21/23  Yes [provider]  cyclobenzaprine (FLEXERIL) 10 MG tablet Take 10 mg by mouth 3 (three) times daily. 11/21/23  Yes [provider]   dapagliflozin  propanediol (FARXIGA ) 10 MG TABS tablet Take 10 mg by mouth daily.    Yes [provider]  diazepam  (VALIUM ) 5 MG tablet Take 5 mg by mouth 2 (two) times daily.    Yes [provider]  ferrous gluconate  (FERGON) 324 MG tablet Take 324 mg by mouth daily.   Yes [provider]  FLUoxetine  (PROZAC ) 10 MG capsule Take 20 mg by mouth in the morning. 11/27/19  Yes [provider]  HYDROcodone -acetaminophen  (NORCO/VICODIN) 5-325 MG tablet Take 1 tablet by mouth every 6 (six) hours as needed for severe pain (pain score 7-10). 01/18/24  Yes Mallipeddi, Vishnu P, MD  hydrOXYzine  (VISTARIL ) 25 MG capsule Take 25 mg by mouth 3 (three) times daily. 09/21/23  Yes [provider]  ivabradine  (CORLANOR ) 7.5 MG TABS tablet Take 1 tablet (7.5 mg total) by mouth 2 (two) times daily with a meal. 01/18/24  Yes Mallipeddi, Vishnu P, MD  levETIRAcetam  (KEPPRA  XR) 500 MG 24 hr tablet Take 2 tablets (1,000 mg total) by mouth daily. 05/08/23  Yes Patel, Donika K, DO  magnesium  oxide (MAG-OX) 400 MG tablet Take 400 mg by mouth daily. 09/28/23 09/27/24 Yes [provider]  metFORMIN  (GLUCOPHAGE ) 500 MG tablet Take 500 mg by mouth 3 (three) times daily. 11/16/18  Yes [provider]  metoprolol  succinate (TOPROL -XL) 25 MG 24 hr tablet Take 0.5 tablets (12.5 mg total) by mouth daily. 10/27/23  Yes Gonfa, Taye T, MD  nitroGLYCERIN  (NITROSTAT ) 0.4 MG SL tablet Place 1 tablet (0.4 mg total) under the tongue every 5 (five) minutes as needed for chest pain. 11/03/23 02/01/24 Yes Clegg, Amy D, NP  OZEMPIC , 0.25 OR 0.5 MG/DOSE, 2 MG/3ML SOPN Inject 0.25 mg into the skin once a week. Mondays 12/19/23  Yes [provider]  pantoprazole  (PROTONIX ) 40 MG tablet Take 1 tablet (40 mg total) by mouth daily. 10/27/23  Yes Gonfa, Taye T, MD  pregabalin  (LYRICA ) 150 MG capsule TAKE 1 CAPSULE BY MOUTH THREE TIMES DAILY 10/09/23  Yes Patel, Donika K, DO  ranolazine  (RANEXA ) 1000  MG SR tablet Take 1 tablet (1,000 mg total) by mouth 2 (two) times daily. 01/18/24  Yes Mallipeddi, Vishnu P, MD  risperiDONE  (RISPERDAL ) 0.5 MG tablet Take 0.5 mg by mouth at bedtime.   Yes [provider]  sacubitril -valsartan  (ENTRESTO ) 24-26 MG Take 1 tablet by mouth 2 (two) times daily. 12/07/23  Yes Mallipeddi, Vishnu P, MD  spironolactone  (ALDACTONE ) 25 MG tablet Take 0.5 tablets (12.5 mg total) by  mouth daily. 01/18/24 04/17/24 Yes Mallipeddi, Vishnu P, MD  ticagrelor  (BRILINTA ) 90 MG TABS tablet Take 1 tablet (90 mg total) by mouth 2 (two) times daily. 10/26/23  Yes Gonfa, Taye T, MD  tiZANidine  (ZANAFLEX ) 2 MG tablet TAKE 1 TABLET BY MOUTH AT BEDTIME AS NEEDED FOR MUSCLE SPASMS 07/15/21  Yes Patel, Donika K, DO  traMADol  (ULTRAM ) 50 MG tablet Take 50 mg by mouth 4 (four) times daily. 01/13/21  Yes [provider]  traZODone  (DESYREL ) 100 MG tablet Take 200 mg by mouth at bedtime.    Yes [provider]  blood glucose meter kit and supplies Dispense based on patient and insurance preference. Use up to four times daily as directed. (FOR ICD-10 E10.9, E11.9). 07/05/19   Arrien, Elidia Sieving, MD    Physical Exam: Vitals:   01/19/24 1637 01/19/24 1659  BP: 108/66 104/70  Resp: 18   Temp: 97.8 F (36.6 C)   TempSrc: Oral    General: Alert, oriented x3, resting comfortably in no acute distress Respiratory: Lungs clear to auscultation bilaterally with normal respiratory effort; no w/r/r Cardiovascular: Regular rate and rhythm w/o m/r/g   Data Reviewed:  Lab Results  Component Value Date   WBC 15.1 (H) 10/26/2023   HGB 9.3 (L) 10/26/2023   HCT 32.6 (L) 10/26/2023   MCV 71.3 (L) 10/26/2023   PLT 246 10/26/2023   Lab Results  Component Value Date   GLUCOSE 149 (H) 11/03/2023   CALCIUM  9.4 11/03/2023   NA 138 11/03/2023   K 4.4 11/03/2023   CO2 26 11/03/2023   CL 105 11/03/2023   BUN 14 11/03/2023   CREATININE 0.89 11/03/2023   Lab Results  Component  Value Date   ALT 26 10/17/2023   AST 36 10/17/2023   ALKPHOS 76 10/17/2023   BILITOT 0.6 10/17/2023   Lab Results  Component Value Date   INR 1.0 02/04/2022   INR 1.0 06/24/2019   INR 1.1 03/25/2019    Radiology: No results found.  Assessment and Plan: 25F h/o BPD, multivessel CAD, prior CVA, ischemic cardiomyopathy, seizure disorder, chronic pain, HTN, PAD, and DM2 p/w elevated troponin c/w NSTEMI after GLF at home.  NSTEMI H/o multivessel CAD H/o ICM Troponin 200-->800s-->18000s -Cards consulted; apprec eval/recs -Continue IV hep gtt per pharmacy protocol -PTA DAPT w/ ASA, and Brilinta  -PTA GDMT: Metoprolol  XL, Ivabradine , Entresto , spironolactone , and atorvastatin   CAP -IV CTX 1g daily x 4 days to complete 5 day CAP course -PO azithromycin  500mg  daily x3 doses to complete 3 day CAP course -Duonebs prn -Wean O2 as tolerated -Ambulatory pulse ox prior to d/c  L rib fractures 2/2 GLF -Multimodal pain control: Tylenol  1g TID, robaxin  500mg  TID, and oxycodone  5mg  prn  GLF Physical deconditioning Polypharmacy -PT/OT consulted; apprec eval/recs -HOLD pta baclofen  and cyclobenzarpine as these muscle relaxants are thought to increase fall risk compared w/ robaxin  -HOLD and consider d/c valium  altogether -HOLD Lyrica   BPD -PTA risperdal  0.5mg  at bedtime and trazodone  200mg  at bedtime   Advance Care Planning:   Code Status: Full Code   Consults: Cards  Family Communication: Brother  Severity of Illness: The appropriate patient status for this patient is INPATIENT. Inpatient status is judged to be reasonable and necessary in order to provide the required intensity of service to ensure the patient's safety. The patient's presenting symptoms, physical exam findings, and initial radiographic and laboratory data in the context of their chronic comorbidities is felt to place them at high risk for further clinical  deterioration. Furthermore, it is not anticipated that the  patient will be medically stable for discharge from the hospital within 2 midnights of admission.   * I certify that at the point of admission it is my clinical judgment that the patient will require inpatient hospital care spanning beyond 2 midnights from the point of admission due to high intensity of service, high risk for further deterioration and high frequency of surveillance required.*   ------- I spent 60 minutes reviewing previous notes, at the bedside counseling/discussing the treatment plan, and performing clinical documentation.  Author: Marsha Ada, MD 01/19/2024 5:24 PM  For on call review [email or web address].

## 2024-01-19 NOTE — Progress Notes (Addendum)
 PHARMACY - ANTICOAGULATION CONSULT NOTE  Pharmacy Consult for heparin  Indication: chest pain/ACS  Allergies  Allergen Reactions   Amitriptyline Other (See Comments)    Unresponsive    Contrast Media [Iodinated Contrast Media] Other (See Comments)    2013-had CVA, went into AFR-combination ABT, contrast, low BP   Morphine  And Codeine Other (See Comments)    Chest pain   Promethazine  Swelling    Lips swell   Compazine Other (See Comments)    -Alert but unable to move   Prochlorperazine Maleate Other (See Comments)    -Alert, but unable to move   Adhesive [Tape] Rash    Long term exposure causes skin to tear    Oxycodone  Nausea And Vomiting    Severe n/v -  patient will not take oxycodone  or percocet.   Sumatriptan Rash    Patient Measurements: Dosing body weight 76kg  Labs: No results for input(s): HGB, HCT, PLT, APTT, LABPROT, INR, HEPARINUNFRC, HEPRLOWMOCWT, CREATININE, CKTOTAL, CKMB, TROPONINIHS in the last 72 hours.  CrCl cannot be calculated (Patient's most recent lab result is older than the maximum 21 days allowed.).   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   due to elevated blood sugar 1700-off insulin  6 months   Depression    Diabetes mellitus    off insulin  6 months   Diverticulitis    Dyspnea    when walking   GERD (gastroesophageal reflux disease)    Hypertension    Kidney dialysis status    on dialysis(stroke) off after 4-6 weeks   Pneumonia 2013, 06/2018   Respiratory failure (HCC) 04/2012   with stroke - in hosp [facility] 12 weeks   Seizures (HCC)    during elvated blood sugsr episode 11/13   Sepsis (HCC)    Stroke (HCC)    11/2018, 12/2018 Weak left side, speech- slurred, Short term memeroy, Gait unsteady    Vocal cord dysfunction     Medications:  Medications Prior  to Admission  Medication Sig Dispense Refill Last Dose/Taking   aspirin  81 MG EC tablet Take 81 mg by mouth daily.       atorvastatin  (LIPITOR ) 80 MG tablet TAKE 1 TABLET BY MOUTH DAILY 30 tablet 0    blood glucose meter kit and supplies Dispense based on patient and insurance preference. Use up to four times daily as directed. (FOR ICD-10 E10.9, E11.9). 1 each 0    busPIRone  (BUSPAR ) 5 MG tablet Take 5 mg by mouth 3 (three) times daily.      cyclobenzaprine (FLEXERIL) 10 MG tablet Take 10 mg by mouth 3 (three) times daily.      dapagliflozin  propanediol (FARXIGA ) 10 MG TABS tablet Take 10 mg by mouth daily.       diazepam  (VALIUM ) 5 MG tablet Take 5 mg by mouth 2 (two) times daily.       DULoxetine  (CYMBALTA ) 60 MG capsule Take 60 mg by mouth daily.      ferrous gluconate  (FERGON) 324 MG tablet Take 324 mg by mouth daily.      FLUoxetine  (PROZAC ) 10 MG capsule Take 10 mg by mouth in the morning.      HYDROcodone -acetaminophen  (NORCO/VICODIN) 5-325 MG tablet Take 1 tablet by mouth every 6 (six) hours as needed for severe pain (pain score 7-10). 10 tablet 0    hydrOXYzine  (VISTARIL )  25 MG capsule Take 25 mg by mouth 3 (three) times daily.      ivabradine  (CORLANOR ) 7.5 MG TABS tablet Take 1 tablet (7.5 mg total) by mouth 2 (two) times daily with a meal. 60 tablet 5    levETIRAcetam  (KEPPRA  XR) 500 MG 24 hr tablet Take 2 tablets (1,000 mg total) by mouth daily. 180 tablet 3    magnesium  oxide (MAG-OX) 400 MG tablet Take 400 mg by mouth daily.      metFORMIN  (GLUCOPHAGE ) 500 MG tablet Take 500 mg by mouth 3 (three) times daily.      metoprolol  succinate (TOPROL -XL) 25 MG 24 hr tablet Take 0.5 tablets (12.5 mg total) by mouth daily. 45 tablet 0    nitroGLYCERIN  (NITROSTAT ) 0.4 MG SL tablet Place 1 tablet (0.4 mg total) under the tongue every 5 (five) minutes as needed for chest pain. 90 tablet 3    pantoprazole  (PROTONIX ) 40 MG tablet Take 1 tablet (40 mg total) by mouth daily. 30 tablet 0     pregabalin  (LYRICA ) 150 MG capsule TAKE 1 CAPSULE BY MOUTH THREE TIMES DAILY 90 capsule 5    ranolazine  (RANEXA ) 1000 MG SR tablet Take 1 tablet (1,000 mg total) by mouth 2 (two) times daily. 60 tablet 5    risperiDONE  (RISPERDAL ) 0.5 MG tablet Take 0.5 mg by mouth at bedtime.      sacubitril -valsartan  (ENTRESTO ) 24-26 MG Take 1 tablet by mouth 2 (two) times daily. 60 tablet 6    semaglutide -weight management (WEGOVY ) 0.25 MG/0.5ML SOAJ SQ injection Inject 0.25 mg into the skin once a week.      spironolactone  (ALDACTONE ) 25 MG tablet Take 0.5 tablets (12.5 mg total) by mouth daily. 45 tablet 3    ticagrelor  (BRILINTA ) 90 MG TABS tablet Take 1 tablet (90 mg total) by mouth 2 (two) times daily. 180 tablet 0    tiZANidine  (ZANAFLEX ) 2 MG tablet TAKE 1 TABLET BY MOUTH AT BEDTIME AS NEEDED FOR MUSCLE SPASMS 30 tablet 2    traMADol  (ULTRAM ) 50 MG tablet Take 50 mg by mouth 4 (four) times daily.      traZODone  (DESYREL ) 100 MG tablet Take 200 mg by mouth at bedtime.       Scheduled:   aspirin  EC  81 mg Oral Daily   atorvastatin   80 mg Oral Daily   azithromycin   500 mg Oral Daily   busPIRone   5 mg Oral TID   DULoxetine   60 mg Oral Daily   enoxaparin  (LOVENOX ) injection  40 mg Subcutaneous Q24H   [START ON 01/20/2024] FLUoxetine   10 mg Oral q AM   hydrOXYzine   25 mg Oral TID   levETIRAcetam   1,000 mg Oral Daily   metoprolol  succinate  12.5 mg Oral Daily   pantoprazole   40 mg Oral Daily   ranolazine   1,000 mg Oral BID   risperiDONE   0.5 mg Oral QHS   sacubitril -valsartan   1 tablet Oral BID   ticagrelor   90 mg Oral BID   traZODone   200 mg Oral QHS    Assessment: 43 yo female with CP and hx CAD. Pharmacy consulted to dose heparin . She is not on anticoagulation at home, She was transferred from [facility] and is on heparin  at 1000 units/hr    Goal of Therapy:  Heparin  level 0.3-0.7 units/ml Monitor platelets by anticoagulation protocol: Yes   Plan:  -Continue heparin  1000 units/hr -Check  heparin  level later tonight  Prentice Poisson, PharmD Clinical Pharmacist **Pharmacist phone directory can now be found on  [email or web address] ([email or web address]).  Listed under [facility].  Addendum -She is now s/p cath with no PCI and to resume heparin  2 hours after TR band is removed -Plans are for 48hrs heparin  per Dr. Wendel  Plan -Resume heparin  1000 units/hr 2 hours after TR band removed -Heparin  level in 6 hours and daily wth CBC daily  Prentice Poisson, PharmD Clinical Pharmacist **Pharmacist phone directory can now be found on [email or web address] ([email or web address]).  Listed under [facility].

## 2024-01-19 NOTE — Consult Note (Addendum)
 Cardiology Consultation   Patient ID: Taylor [name] MRN: [id number]; DOB: 03/24/1981  Admit date: 01/19/2024 Date of Consult: 01/19/2024  PCP:  Taylor Isles, MD   [facility] Providers Cardiologist:  Taylor SHAUNNA Maywood, MD        Patient Profile: Taylor [name] is a 43 y.o. female with a hx of multivessel coronary artery disease, prior CVA, ischemic cardiomyopathy, seizure disorder, chronic pain, hypertension, peripheral artery disease, diabetes who is being seen 01/19/2024 for the evaluation of elevated troponin at the request of Taylor [name].    History of Present Illness: Taylor [name] presented to OSH today s/p fall at home. Also reported chest pain, back pain, nausea. CXR imaging suggestive of possible left lower lobe pneumonia and patient started on abx. CTA chest read with left lung predominant peribronchovascular groundglass opacities and patchy consolidations, may represent pneumonia, pulmonary hemorrhage, and/or aspiration. Nondisplaced left fifth through ninth rib fracture. Given chest pain and history of significant CAD, troponin checked. Initially Troponin I [phone] and patient was started on heparin  for possible NSTEMI. Notably had lactic acid of 3.3->2.6. Transfer to [facility] arranged with admission to medicine service. After transfer plans initiated, a third troponin resulted at 18960.   Patient was admitted earlier this year with chest pain.  CTA chest at that time noted diffuse infiltrates throughout the left lung, atypical infection versus pneumonitis.  She was started on azithromycin  and ceftriaxone .  Troponin at that time 38 up to a peak of 86.  Her chest pain was felt to be atypical and patient underwent a coronary CTA.  This found a coronary calcium  score of 2837 with extensive and severe diffuse plaque throughout proximal LAD, left circumflex, RCA.  She underwent left heart catheterization that showed severe multivessel coronary artery disease with elevated LVEDP.  Initially  cardiothoracic surgery was consulted for possible bypass.  Patient was seen by them and not deemed a candidate.  She then returned to the lab for staged PCI of mid to distal left circumflex.  RCA and LAD were not intervened on with medical management recommended.  Patient was seen in heart failure TOC clinic on 11/03/2023 following her admission and PCI.  Overall doing well at that time.  Patient was seen by Taylor [name] on 12/07/2023 and reported no interval chest pain.  She was continued on DAPT and atorvastatin .  Patient was seen again yesterday, 8/7 in clinic follow-up by Taylor [name].  At this visit patient reported having exertional chest pain 3-4 times per week lasting for approximately 5 minutes.  She also reported 4 pillow orthopnea.  Patient's Ranexa  was increased from 500 mg twice daily to 1000 mg twice daily.  She was continued on her heart failure GDMT including metoprolol  succinate 12.5 mg Entresto  24-26 mg twice daily, ivabradine  5 mg twice daily (previously started by Taylor [name] with tachycardia), and started on spironolactone  12.5 mg once daily.  Upon my examination of patient today after transfer to [facility], patient and mother confirm symptoms concerning for progressive angina. Over the last several weeks, patient increasingly intolerant of physical activity with dyspnea and chest tightness. Yesterday, she developed persistent chest pressure, described as if someone was standing on her chest. Patient reports severity up to 8/10. Last night she reports getting tangled in the blankets on her bed, resulting in a fall on her left side. As above, was found with L rib fractures. Reports stable 2 pillow orthopnea over the last few weeks. Denies LE edema and reports stable weights. Is continuing to  have chest pressure at present time. In addition to chest pressure, patient's mother also reports intermittent vomiting over the last few days. Patient feels like she may have had a fever today but denies in  previous days. Family member in the room today who works as a first responder says he noted patient to be clammy and sweaty, I thought she was having a heart attack earlier today at OSH.   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   due to elevated blood sugar 1700-off insulin  6 months   Depression    Diabetes mellitus    off insulin  6 months   Diverticulitis    Dyspnea    when walking   GERD (gastroesophageal reflux disease)    Hypertension    Kidney dialysis status    on dialysis(stroke) off after 4-6 weeks   Pneumonia 2013, 06/2018   Respiratory failure (HCC) 04/2012   with stroke - in hosp [facility] 12 weeks   Seizures (HCC)    during elvated blood sugsr episode 11/13   Sepsis (HCC)    Stroke (HCC)    11/2018, 12/2018 Weak left side, speech- slurred, Short term memeroy, Gait unsteady    Vocal cord dysfunction     Past Surgical History:  Procedure Laterality Date   ABDOMINAL AORTOGRAM W/LOWER EXTREMITY N/A 06/25/2019   Procedure: ABDOMINAL AORTOGRAM W/LOWER EXTREMITY;  Surgeon: Serene Gaile ORN, MD;  Location: [facility] IN[facility];  Service: Cardiovascular;  Laterality: N/A;   ABDOMINAL AORTOGRAM W/LOWER EXTREMITY N/A 10/22/2019   Procedure: ABDOMINAL AORTOGRAM W/LOWER EXTREMITY;  Surgeon: Serene Gaile ORN, MD;  Location: [facility];  Service: Cardiovascular;  Laterality: N/A;   ABDOMINAL AORTOGRAM W/LOWER EXTREMITY Left 04/01/2020   Procedure: ABDOMINAL AORTOGRAM W/LOWER EXTREMITY;  Surgeon: Gretta Lonni PARAS, MD;  Location: [facility];  Service: Cardiovascular;  Laterality: Left;   ABDOMINAL AORTOGRAM W/LOWER EXTREMITY N/A 02/01/2022   Procedure: ABDOMINAL AORTOGRAM W/LOWER EXTREMITY;  Surgeon: Serene Gaile ORN, MD;  Location: [facility];  Service: Cardiovascular;  Laterality: N/A;   AMPUTATION Left 07/03/2019    Procedure: LEFT FOOT 4TH AND 5TH RAY AMPUTATION;  Surgeon: Harden Jerona GAILS, MD;  Location: [facility] OR;  Service: Orthopedics;  Laterality: Left;   APPENDECTOMY     CORONARY STENT INTERVENTION N/A 10/24/2023   Procedure: CORONARY STENT INTERVENTION;  Surgeon: Anner Alm ORN, MD;  Location: [facility] INVASIVE CV LAB;  Service: Cardiovascular;  Laterality: N/A;   FEMORAL-POPLITEAL BYPASS GRAFT Left 02/04/2022   Procedure: LEFT FEMORAL-POPLITEAL ARTERY BYPASS;  Surgeon: Serene Gaile ORN, MD;  Location: [facility] OR;  Service: Vascular;  Laterality: Left;   INSERTION OF DIALYSIS CATHETER  11/13   removed in 4-6 weeks   IR ANGIO INTRA EXTRACRAN SEL COM CAROTID INNOMINATE BILAT MOD SED  02/07/2019   IR ANGIO VERTEBRAL SEL VERTEBRAL BILAT MOD SED  02/07/2019   IR ANGIOGRAM EXTREMITY LEFT  02/07/2019   IR ANGIOGRAM EXTREMITY RIGHT  02/20/2019   IR TRANSCATH EXCRAN VERT OR CAR A STENT  02/20/2019   IR TRANSCATH EXCRAN VERT OR CAR A STENT  03/25/2019   IR US  GUIDE VASC ACCESS RIGHT  02/07/2019   LEFT HEART CATH AND CORONARY ANGIOGRAPHY N/A 10/20/2023   Procedure: LEFT HEART CATH AND CORONARY ANGIOGRAPHY;  Surgeon: [name] Lurena POUR, MD;  Location: [facility];  Service: Cardiovascular;  Laterality: N/A;   MULTIPLE EXTRACTIONS WITH ALVEOLOPLASTY N/A 02/24/2014   Procedure: MULTIPLE EXTRACTION WITH ALVEOLOPLASTY AND BIOPSY;  Surgeon: Glendia CHRISTELLA Primrose, DDS;  Location: [facility] OR;  Service: Oral Surgery;  Laterality: N/A;   PERIPHERAL VASCULAR ATHERECTOMY Left 06/25/2019   Procedure: PERIPHERAL VASCULAR ATHERECTOMY;  Surgeon: Serene Gaile ORN, MD;  Location: [facility];  Service: Cardiovascular;  Laterality: Left;  superficial femoral   PERIPHERAL VASCULAR BALLOON ANGIOPLASTY Left 10/22/2019   Procedure: PERIPHERAL VASCULAR BALLOON ANGIOPLASTY;  Surgeon: Serene Gaile ORN, MD;  Location: [facility];  Service: Cardiovascular;  Laterality: Left;  superficial femoral   PERIPHERAL VASCULAR INTERVENTION  04/01/2020   Procedure:  PERIPHERAL VASCULAR INTERVENTION;  Surgeon: Gretta Lonni PARAS, MD;  Location: [facility];  Service: Cardiovascular;;  left SFA stent    RADIOLOGY WITH ANESTHESIA N/A 02/20/2019   Procedure: RADIOLOGY WITH ANESTHESIA   STENTING;  Surgeon: Dolphus Carrion, MD;  Location: [facility];  Service: Radiology;  Laterality: N/A;   RADIOLOGY WITH ANESTHESIA N/A 03/25/2019   Procedure: RADIOLOGY WITH ANESTHESIA STENTING;  Surgeon: Dolphus Carrion, MD;  Location: [facility] OR;  Service: Radiology;  Laterality: N/A;   SKIN SPLIT GRAFT Left 07/03/2019   Procedure: APPLY SKIN GRAFT LEFT FOOT;  Surgeon: Harden Jerona GAILS, MD;  Location: [facility] OR;  Service: Orthopedics;  Laterality: Left;   TRACHEOSTOMY  11/13   closed 1/14   TRANSRECTAL DRAINAGE OF PELVIC ABSCESS  12/11   VASCULAR SURGERY  04/01/2020   two stents placed in left leg     Home Medications:  Prior to Admission medications   Medication Sig Start Date End Date Taking? Authorizing Provider  aspirin  81 MG EC tablet Take 81 mg by mouth daily.     [provider]  atorvastatin  (LIPITOR ) 80 MG tablet TAKE 1 TABLET BY MOUTH DAILY 01/10/23   Sheree Penne Lonni, MD  blood glucose meter kit and supplies Dispense based on patient and insurance preference. Use up to four times daily as directed. (FOR ICD-10 E10.9, E11.9). 07/05/19   Arrien, Elidia Sieving, MD  busPIRone  (BUSPAR ) 5 MG tablet Take 5 mg by mouth 3 (three) times daily. 12/21/23   [provider]  cyclobenzaprine (FLEXERIL) 10 MG tablet Take 10 mg by mouth 3 (three) times daily. 11/21/23   [provider]  dapagliflozin  propanediol (FARXIGA ) 10 MG TABS tablet Take 10 mg by mouth daily.     [provider]  diazepam  (VALIUM ) 5 MG tablet Take 5 mg by mouth 2 (two) times daily.     [provider]  DULoxetine  (CYMBALTA ) 60 MG capsule Take 60 mg by mouth daily.    [provider]  ferrous gluconate  (FERGON) 324 MG tablet Take 324 mg by mouth  daily.    [provider]  FLUoxetine  (PROZAC ) 10 MG capsule Take 10 mg by mouth in the morning. 11/27/19   [provider]  HYDROcodone -acetaminophen  (NORCO/VICODIN) 5-325 MG tablet Take 1 tablet by mouth every 6 (six) hours as needed for severe pain (pain score 7-10). 01/18/24   [name], Vishnu P, MD  hydrOXYzine  (VISTARIL ) 25 MG capsule Take 25 mg by mouth 3 (three) times daily. 09/21/23   [provider]  ivabradine  (CORLANOR ) 7.5 MG TABS tablet Take 1 tablet (7.5 mg total) by mouth 2 (two) times daily with a meal. 01/18/24   [name], Vishnu P, MD  levETIRAcetam  (KEPPRA  XR) 500 MG 24 hr tablet Take 2 tablets (1,000 mg total) by mouth daily. 05/08/23  Patel, Donika K, DO  magnesium  oxide (MAG-OX) 400 MG tablet Take 400 mg by mouth daily. 09/28/23 09/27/24  [provider]  metFORMIN  (GLUCOPHAGE ) 500 MG tablet Take 500 mg by mouth 3 (three) times daily. 11/16/18   [provider]  metoprolol  succinate (TOPROL -XL) 25 MG 24 hr tablet Take 0.5 tablets (12.5 mg total) by mouth daily. 10/27/23   Gonfa, Taye T, MD  nitroGLYCERIN  (NITROSTAT ) 0.4 MG SL tablet Place 1 tablet (0.4 mg total) under the tongue every 5 (five) minutes as needed for chest pain. 11/03/23 02/01/24  Clegg, Amy D, NP  pantoprazole  (PROTONIX ) 40 MG tablet Take 1 tablet (40 mg total) by mouth daily. 10/27/23   Gonfa, Taye T, MD  pregabalin  (LYRICA ) 150 MG capsule TAKE 1 CAPSULE BY MOUTH THREE TIMES DAILY 10/09/23   Patel, Donika K, DO  ranolazine  (RANEXA ) 1000 MG SR tablet Take 1 tablet (1,000 mg total) by mouth 2 (two) times daily. 01/18/24   [name], Vishnu P, MD  risperiDONE  (RISPERDAL ) 0.5 MG tablet Take 0.5 mg by mouth at bedtime.    [provider]  sacubitril -valsartan  (ENTRESTO ) 24-26 MG Take 1 tablet by mouth 2 (two) times daily. 12/07/23   [name], Vishnu P, MD  semaglutide -weight management (WEGOVY ) 0.25 MG/0.5ML SOAJ SQ injection Inject 0.25 mg into the skin once a week.     [provider]  spironolactone  (ALDACTONE ) 25 MG tablet Take 0.5 tablets (12.5 mg total) by mouth daily. 01/18/24 04/17/24  [name], Vishnu P, MD  ticagrelor  (BRILINTA ) 90 MG TABS tablet Take 1 tablet (90 mg total) by mouth 2 (two) times daily. 10/26/23   Gonfa, Taye T, MD  tiZANidine  (ZANAFLEX ) 2 MG tablet TAKE 1 TABLET BY MOUTH AT BEDTIME AS NEEDED FOR MUSCLE SPASMS 07/15/21   Patel, Donika K, DO  traMADol  (ULTRAM ) 50 MG tablet Take 50 mg by mouth 4 (four) times daily. 01/13/21   [provider]  traZODone  (DESYREL ) 100 MG tablet Take 200 mg by mouth at bedtime.     [provider]    Scheduled Meds:  aspirin  EC  81 mg Oral Daily   atorvastatin   80 mg Oral Daily   azithromycin   500 mg Oral Daily   busPIRone   5 mg Oral TID   [START ON 01/20/2024] FLUoxetine   20 mg Oral q AM   hydrOXYzine   25 mg Oral TID   levETIRAcetam   1,000 mg Oral Daily   metoprolol  succinate  12.5 mg Oral Daily   pantoprazole   40 mg Oral Daily   ranolazine   1,000 mg Oral BID   risperiDONE   0.5 mg Oral QHS   sacubitril -valsartan   1 tablet Oral BID   ticagrelor   90 mg Oral BID   traZODone   200 mg Oral QHS   Continuous Infusions:  [START ON 01/20/2024] cefTRIAXone  (ROCEPHIN )  IV     heparin      PRN Meds: HYDROcodone -acetaminophen , nitroGLYCERIN   Allergies:    Allergies  Allergen Reactions   Amitriptyline Other (See Comments)    Severe dissociative state   Contrast Media [Iodinated Contrast Media] Other (See Comments)    2013-had CVA, went into AFR-combination ABT, contrast, low BP   Morphine  And Codeine Other (See Comments)    Chest pain   Promethazine  Swelling    Lips swell   Compazine Other (See Comments)    -Alert but unable to move   Prochlorperazine Maleate Other (See Comments)    -Alert, but unable to move   Adhesive [Tape] Rash    Long term  exposure causes skin to tear    Oxycodone  Nausea And Vomiting    Severe n/v -  patient will not take oxycodone  or percocet.    Sumatriptan Rash    Social History:   Social History   Socioeconomic History   Marital status: Single    Spouse name: Not on file   Number of children: 0   Years of education: 14   Highest education level: Not on file  Occupational History   Not on file  Tobacco Use   Smoking status: Former    Current packs/day: 0.00    Average packs/day: 0.6 packs/day for 23.0 years (13.8 ttl pk-yrs)    Types: Cigarettes    Start date: 02/05/1996    Quit date: 02/05/2019    Years since quitting: 4.9    Passive exposure: Never   Smokeless tobacco: Never  Vaping Use   Vaping status: Never Used  Substance and Sexual Activity   Alcohol use: No    Alcohol/week: 0.0 standard drinks of alcohol   Drug use: No   Sexual activity: Not Currently    Birth control/protection: None  Other Topics Concern   Not on file  Social History Narrative   She is single and does not have any children.    She has 2 yrs of college level education.   She has 5-6 caffeine drinks daily.    Left handed      Living with parents.   Social Drivers of Corporate investment banker Strain: Low Risk  (10/25/2023)   Overall Financial Resource Strain (CARDIA)    Difficulty of Paying Living Expenses: Not very hard  Food Insecurity: No Food Insecurity (06/30/2023)   Received from [facility]   Hunger Vital Sign    Within the past 12 months, you worried that your food would run out before you got the money to buy more.: Never true    Within the past 12 months, the food you bought just didn't last and you didn't have money to get more.: Never true  Transportation Needs: No Transportation Needs (10/25/2023)   PRAPARE - Administrator, Civil Service (Medical): No    Lack of Transportation (Non-Medical): No  Physical Activity: Insufficiently Active (06/30/2023)   Received from [facility]   Exercise Vital Sign    On average, how many days per week do you engage in moderate to strenuous exercise (like a brisk  walk)?: 2 days    On average, how many minutes do you engage in exercise at this level?: 30 min  Stress: No Stress Concern Present (06/30/2023)   Received from [facility] - Occupational Stress Questionnaire    Feeling of Stress : Not at all  Social Connections: Patient Unable To Answer (10/18/2023)   Social Connection and Isolation Panel    Frequency of Communication with Friends and Family: Patient unable to answer    Frequency of Social Gatherings with Friends and Family: Patient unable to answer    Attends Religious Services: Patient unable to answer    Active Member of Clubs or Organizations: Patient unable to answer    Attends Banker Meetings: Patient unable to answer    Marital Status: Patient unable to answer  Intimate Partner Violence: Unknown (09/16/2021)   Received from [facility]   HITS    Physically Hurt: Not on file    Insult or Talk Down To: Not on file  Threaten Physical Harm: Not on file    Scream or Curse: Not on file    Family History:    Family History  Problem Relation Age of Onset   Lung cancer Maternal Grandmother    Heart attack Maternal Grandfather    Diabetes Paternal Grandfather      ROS:  Please see the history of present illness.   All other ROS reviewed and negative.     Physical Exam/Data: Vitals:   01/19/24 1637  BP: 108/66  Resp: 18  Temp: 97.8 F (36.6 C)  TempSrc: Oral   No intake or output data in the 24 hours ending 01/19/24 1653    01/18/2024   10:24 AM 12/07/2023    3:25 PM 11/14/2023    2:37 PM  Last 3 Weights  Weight (lbs) 212 lb 220 lb 219 lb 1.6 oz  Weight (kg) 96.163 kg 99.791 kg 99.383 kg     There is no height or weight on file to calculate BMI.  General:  Well nourished, well developed. Uncomfortable/ill appearing. HEENT: normal Neck: no JVD Vascular: No carotid bruits; Distal pulses 2+ bilaterally Cardiac:  normal S1, S2; RRR;  Lungs:  clear to  auscultation bilaterally, no wheezing, rhonchi or rales. Poor inspiratory effort. Abd: soft, nontender, no hepatomegaly  Ext: no edema Musculoskeletal:  No deformities, BUE and BLE strength normal and equal Skin: warm and dry  Neuro:  CNs 2-12 intact, no focal abnormalities noted Psych:  Normal affect   EKG:  EKG pending. Telemetry:  Telemetry was personally reviewed and demonstrates:  sinus rhythm, HR high 90s.  Relevant CV Studies:  10/24/23 LHC               10/20/23 TTE 1. Septal , mid/basal inferior wall and lateral wall hypokinesis . Left  ventricular ejection fraction, by estimation, is 35 to 40%. The left  ventricle has moderately decreased function. The left ventricle  demonstrates regional wall motion abnormalities  (see scoring diagram/findings for description). The left ventricular  internal cavity size was mildly dilated. Left ventricular diastolic  parameters are consistent with Grade II diastolic dysfunction  (pseudonormalization). Elevated left ventricular  end-diastolic pressure.   2. Right ventricular systolic function is normal. The right ventricular  size is normal.   3. Left atrial size was moderately dilated.   4. Moderate subvalvular calcification of the posterior leaflet chords .  The mitral valve is abnormal. Trivial mitral valve regurgitation. No  evidence of mitral stenosis.   5. The aortic valve is tricuspid. Aortic valve regurgitation is not  visualized. No aortic stenosis is present.   6. The inferior vena cava is normal in size with greater than 50%  respiratory variability, suggesting right atrial pressure of 3 mmHg.   FINDINGS   Left Ventricle: Septal , mid/basal inferior wall and lateral wall  hypokinesis. Left ventricular ejection fraction, by estimation, is 35 to  40%. The left ventricle has moderately decreased function. The left  ventricle demonstrates regional wall motion  abnormalities. Strain was performed and the global longitudinal  strain is  indeterminate. The left ventricular internal cavity size was mildly  dilated. There is no left ventricular hypertrophy. Left ventricular  diastolic parameters are consistent with  Grade II diastolic dysfunction (pseudonormalization). Elevated left  ventricular end-diastolic pressure.   Right Ventricle: The right ventricular size is normal. No increase in  right ventricular wall thickness. Right ventricular systolic function is  normal.   Left Atrium: Left atrial size was moderately dilated.   Right  Atrium: Right atrial size was normal in size.   Pericardium: Trivial pericardial effusion is present. The pericardial  effusion is posterior to the left ventricle.   Mitral Valve: Moderate subvalvular calcification of the posterior leaflet  chords. The mitral valve is abnormal. There is mild thickening of the  mitral valve leaflet(s). There is mild calcification of the mitral valve  leaflet(s). Mild mitral annular  calcification. Trivial mitral valve regurgitation. No evidence of mitral  valve stenosis.   Tricuspid Valve: The tricuspid valve is normal in structure. Tricuspid  valve regurgitation is trivial. No evidence of tricuspid stenosis.   Aortic Valve: The aortic valve is tricuspid. Aortic valve regurgitation is  not visualized. No aortic stenosis is present.   Pulmonic Valve: The pulmonic valve was normal in structure. Pulmonic valve  regurgitation is not visualized. No evidence of pulmonic stenosis.   Aorta: The aortic root is normal in size and structure.   Venous: The inferior vena cava is normal in size with greater than 50%  respiratory variability, suggesting right atrial pressure of 3 mmHg.   IAS/Shunts: No atrial level shunt detected by color flow Doppler.   Additional Comments: 3D was performed not requiring image post processing  on an independent workstation and was indeterminate.    Laboratory Data: High Sensitivity Troponin:  No results for  input(s): TROPONINIHS in the last 720 hours.   ChemistryNo results for input(s): NA, K, CL, CO2, GLUCOSE, BUN, CREATININE, CALCIUM , MG, GFRNONAA, GFRAA, ANIONGAP in the last 168 hours.  No results for input(s): PROT, ALBUMIN, AST, ALT, ALKPHOS, BILITOT in the last 168 hours. Lipids No results for input(s): CHOL, TRIG, HDL, LABVLDL, LDLCALC, CHOLHDL in the last 168 hours.  HematologyNo results for input(s): WBC, RBC, HGB, HCT, MCV, MCH, MCHC, RDW, PLT in the last 168 hours. Thyroid No results for input(s): TSH, FREET4 in the last 168 hours.  BNPNo results for input(s): BNP, PROBNP in the last 168 hours.  DDimer No results for input(s): DDIMER in the last 168 hours.  Radiology/Studies:  No results found.   Assessment and Plan:  3 vessel CAD Chest pain Elevated troponin Patient admitted by cardiology in May of this year. She was found to have significant 3v CAD, turned down by CT surgery for CABG and had PCI of most significant lesion in LCX. Now on DAPT with ASA/Brilinta . Reports increasing exertional limitation/dyspnea with intermittent chest tightness that has been constant since yesterday. She did fall last night and was found to have L rib fractures on OSH CT chest but says pain from this is clearly different from chest pressure. OSH troponin I Troponin I 297->8701->18960.  Pneumonia reported by OSH. On my review of OSH imaging reports, changes to LL appear more chronic than acute. Given significant known CAD, patient's recent acceleration of symptoms, and troponin delta, high concern for ACS. Despite reporting chest pressure, patient has not yet received any nitroglycerin . Sublingual 0.4mg  dose ordered. If patient has pressure improvement, would start infusion.  Will need LHC. Has contrast allergy that will necessitate prophylaxis, solumedrol 125mg  with diphenhydramine  25mg .  Continue heparin . Continue home regimen  of Toprol  X 12.5mg , Ranexa  1000mg  BID, Atorvastatin  80mg . Continue DAPT with ASA and Brilinta .   Informed Consent   Shared Decision Making/Informed Consent The risks [stroke (1 in 1000), death (1 in 1000), kidney failure [usually temporary] (1 in 500), bleeding (1 in 200), allergic reaction [possibly serious] (1 in 200)], benefits (diagnostic support and management of coronary artery disease) and alternatives of a cardiac catheterization  were discussed in detail with Taylor [name] and she is willing to proceed.     Chronic HFrEF Ischemic cardiomyopathy Hypertension Patient with LVEF 35-40% per 10/20/23 TTE. Appears generally euvolemic today. PTA GDMT includes Farxiga  10mg , Ivabradine  7.5mg  BID. Reports stable 2 pillow orthopnea.  Continue Toprol  XL 12.5mg  Continue Ivabradine  7.5mg  BID Continue Entresto  24-26mg  BID Continue Spironolactone  12.5mg   Per primary team: Pneumonia DM Seizure disorder Chronic pain Rib fractures s/p fall  Risk Assessment/Risk Scores:    TIMI Risk Score for Unstable Angina or Non-ST Elevation MI:   The patient's TIMI risk score is 5, which indicates a 26% risk of all cause mortality, new or recurrent myocardial infarction or need for urgent revascularization in the next 14 days.  [place]  [employer] (NYHA) Functional Class NYHA Class II/III       For questions or updates, please contact [facility] Please consult [email or web address] for contact info under    Signed, Artist Pouch, PA-C  01/19/2024 4:53 PM  Patient seen, examined. Available data reviewed. Agree with findings, assessment, and plan as outlined by Artist Pouch, PA-C.  The patient is independently interviewed and personally examined.  Her mother and her brother are present at the bedside.  She was transferred here today for non-STEMI.  The patient has a somewhat complicated history as she was hospitalized after a mechanical fall.  She has noted to have nondisplaced left 5th through  9th rib fractures and peribronchovascular groundglass opacities in the left lung.  She also has had a feeling of central chest discomfort and her troponin has trended from 297 at 4:45 AM today up to 8701 at 7:02 AM and then to 18,960 at 10:47 AM.  The patient has a history of complex three-vessel coronary artery disease, deemed to poor candidate for surgical revascularization because of her medical comorbidities and poor targets.  She underwent stenting of the left circumflex in May with recommendations for medical therapy for her residual CAD.  She has noted to have 50% distal left main stenosis, 80% proximal LAD stenosis, severe diffuse circumflex stenosis with previous stenting of the most critical lesion, and moderately severe proximal RCA and severe distal RCA stenoses.  On my exam, the patient is alert, oriented, in no distress.  She is morbidly obese.  HEENT is normal, JVP is normal, carotid upstrokes are 2+ without bruits, lungs are clear bilaterally, heart is regular rate and rhythm with no murmur gallop, abdomen is soft and obese, nontender with no masses, extremities have no edema, right radial pulses 2+.  EKG is normal sinus rhythm 99 bpm, possible inferior infarct age undetermined, cannot rule out anterior infarct age undetermined, no acute ST/T wave changes.  This patient with extensive coronary artery disease, morbid obesity, and uncontrolled diabetes now has a large non-ST elevation MI by cardiac enzymes.  In reviewing her previous cardiac catheterization films, she had severe multivessel disease with percutaneous treatment of a severe stenosis in the left circumflex.  She is at high risk of stent occlusion or progressive coronary disease.  Recommend proceeding urgently with cardiac catheterization and possible PCI in the setting of non-STEMI with ongoing ischemic symptoms and increasing high-sensitivity troponins. I have reviewed the risks, indications, and alternatives to cardiac catheterization,  possible angioplasty, and stenting with the patient. Risks include but are not limited to bleeding, infection, vascular injury, stroke, myocardial infection, arrhythmia, kidney injury, radiation-related injury in the case of prolonged fluoroscopy use, emergency cardiac surgery, and death. The patient understands the risks of serious complication  is 1-2 in 1000 with diagnostic cardiac cath and 1-2% or less with angioplasty/stenting.  The patient will be premedicated with Solu-Medrol  125 mg and Benadryl  25 mg IV for contrast allergy.  Ozell Fell, M.D. 01/19/2024 5:26 PM

## 2024-01-19 NOTE — H&P (View-Only) (Signed)
 Cardiology Consultation   Patient ID: FAMA MUENCHOW MRN: [id number]; DOB: 03/24/1981  Admit date: 01/19/2024 Date of Consult: 01/19/2024  PCP:  Maree Isles, MD   [facility] Providers Cardiologist:  [name], MD        Patient Profile: Taylor [name] is a 43 y.o. female with a hx of multivessel coronary artery disease, prior CVA, ischemic cardiomyopathy, seizure disorder, chronic pain, hypertension, peripheral artery disease, diabetes who is being seen 01/19/2024 for the evaluation of elevated troponin at the request of Dr. Georgina.    History of Present Illness: [name] presented to OSH today s/p fall at home. Also reported chest pain, back pain, nausea. CXR imaging suggestive of possible left lower lobe pneumonia and patient started on abx. CTA chest read with left lung predominant peribronchovascular groundglass opacities and patchy consolidations, may represent pneumonia, pulmonary hemorrhage, and/or aspiration. Nondisplaced left fifth through ninth rib fracture. Given chest pain and history of significant CAD, troponin checked. Initially Troponin I [phone] and patient was started on heparin  for possible NSTEMI. Notably had lactic acid of 3.3->2.6. Transfer to [facility] arranged with admission to medicine service. After transfer plans initiated, a third troponin resulted at 18960.   Patient was admitted earlier this year with chest pain.  CTA chest at that time noted diffuse infiltrates throughout the left lung, atypical infection versus pneumonitis.  She was started on azithromycin  and ceftriaxone .  Troponin at that time 38 up to a peak of 86.  Her chest pain was felt to be atypical and patient underwent a coronary CTA.  This found a coronary calcium  score of 2837 with extensive and severe diffuse plaque throughout proximal LAD, left circumflex, RCA.  She underwent left heart catheterization that showed severe multivessel coronary artery disease with elevated LVEDP.  Initially  cardiothoracic surgery was consulted for possible bypass.  Patient was seen by them and not deemed a candidate.  She then returned to the lab for staged PCI of mid to distal left circumflex.  RCA and LAD were not intervened on with medical management recommended.  Patient was seen in heart failure TOC clinic on 11/03/2023 following her admission and PCI.  Overall doing well at that time.  Patient was seen by Dr. Mallipeddi on 12/07/2023 and reported no interval chest pain.  She was continued on DAPT and atorvastatin .  Patient was seen again yesterday, 8/7 in clinic follow-up by Dr. Mallipeddi.  At this visit patient reported having exertional chest pain 3-4 times per week lasting for approximately 5 minutes.  She also reported 4 pillow orthopnea.  Patient's Ranexa  was increased from 500 mg twice daily to 1000 mg twice daily.  She was continued on her heart failure GDMT including metoprolol  succinate 12.5 mg Entresto  24-26 mg twice daily, ivabradine  5 mg twice daily (previously started by Dr. Mallipeddi with tachycardia), and started on spironolactone  12.5 mg once daily.  Upon my examination of patient today after transfer to [facility], patient and mother confirm symptoms concerning for progressive angina. Over the last several weeks, patient increasingly intolerant of physical activity with dyspnea and chest tightness. Yesterday, she developed persistent chest pressure, described as if someone was standing on her chest. Patient reports severity up to 8/10. Last night she reports getting tangled in the blankets on her bed, resulting in a fall on her left side. As above, was found with L rib fractures. Reports stable 2 pillow orthopnea over the last few weeks. Denies LE edema and reports stable weights. Is continuing to  have chest pressure at present time. In addition to chest pressure, patient's mother also reports intermittent vomiting over the last few days. Patient feels like she may have had a fever today but denies in  previous days. Family member in the room today who works as a first responder says he noted patient to be clammy and sweaty, I thought she was having a heart attack earlier today at OSH.   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   due to elevated blood sugar 1700-off insulin  6 months   Depression    Diabetes mellitus    off insulin  6 months   Diverticulitis    Dyspnea    when walking   GERD (gastroesophageal reflux disease)    Hypertension    Kidney dialysis status    on dialysis(stroke) off after 4-6 weeks   Pneumonia 2013, 06/2018   Respiratory failure (HCC) 04/2012   with stroke - in hosp [facility] 12 weeks   Seizures (HCC)    during elvated blood sugsr episode 11/13   Sepsis (HCC)    Stroke (HCC)    11/2018, 12/2018 Weak left side, speech- slurred, Short term memeroy, Gait unsteady    Vocal cord dysfunction     Past Surgical History:  Procedure Laterality Date   ABDOMINAL AORTOGRAM W/LOWER EXTREMITY N/A 06/25/2019   Procedure: ABDOMINAL AORTOGRAM W/LOWER EXTREMITY;  Surgeon: Serene Gaile ORN, MD;  Location: [facility] INVASIVE CV [facility];  Service: Cardiovascular;  Laterality: N/A;   ABDOMINAL AORTOGRAM W/LOWER EXTREMITY N/A 10/22/2019   Procedure: ABDOMINAL AORTOGRAM W/LOWER EXTREMITY;  Surgeon: Serene Gaile ORN, MD;  Location: [facility];  Service: Cardiovascular;  Laterality: N/A;   ABDOMINAL AORTOGRAM W/LOWER EXTREMITY Left 04/01/2020   Procedure: ABDOMINAL AORTOGRAM W/LOWER EXTREMITY;  Surgeon: Gretta Lonni PARAS, MD;  Location: [facility];  Service: Cardiovascular;  Laterality: Left;   ABDOMINAL AORTOGRAM W/LOWER EXTREMITY N/A 02/01/2022   Procedure: ABDOMINAL AORTOGRAM W/LOWER EXTREMITY;  Surgeon: Serene Gaile ORN, MD;  Location: [facility];  Service: Cardiovascular;  Laterality: N/A;   AMPUTATION Left 07/03/2019    Procedure: LEFT FOOT 4TH AND 5TH RAY AMPUTATION;  Surgeon: Harden Jerona GAILS, MD;  Location: [facility] OR;  Service: Orthopedics;  Laterality: Left;   APPENDECTOMY     CORONARY STENT INTERVENTION N/A 10/24/2023   Procedure: CORONARY STENT INTERVENTION;  Surgeon: Anner Alm ORN, MD;  Location: [facility] INVASIVE CV LAB;  Service: Cardiovascular;  Laterality: N/A;   FEMORAL-POPLITEAL BYPASS GRAFT Left 02/04/2022   Procedure: LEFT FEMORAL-POPLITEAL ARTERY BYPASS;  Surgeon: Serene Gaile ORN, MD;  Location: [facility] OR;  Service: Vascular;  Laterality: Left;   INSERTION OF DIALYSIS CATHETER  11/13   removed in 4-6 weeks   IR ANGIO INTRA EXTRACRAN SEL COM CAROTID INNOMINATE BILAT MOD SED  02/07/2019   IR ANGIO VERTEBRAL SEL VERTEBRAL BILAT MOD SED  02/07/2019   IR ANGIOGRAM EXTREMITY LEFT  02/07/2019   IR ANGIOGRAM EXTREMITY RIGHT  02/20/2019   IR TRANSCATH EXCRAN VERT OR CAR A STENT  02/20/2019   IR TRANSCATH EXCRAN VERT OR CAR A STENT  03/25/2019   IR US  GUIDE VASC ACCESS RIGHT  02/07/2019   LEFT HEART CATH AND CORONARY ANGIOGRAPHY N/A 10/20/2023   Procedure: LEFT HEART CATH AND CORONARY ANGIOGRAPHY;  Surgeon: [name] Lurena POUR, MD;  Location: [facility];  Service: Cardiovascular;  Laterality: N/A;   MULTIPLE EXTRACTIONS WITH ALVEOLOPLASTY N/A 02/24/2014   Procedure: MULTIPLE EXTRACTION WITH ALVEOLOPLASTY AND BIOPSY;  Surgeon: Glendia CHRISTELLA Primrose, DDS;  Location: [facility] OR;  Service: Oral Surgery;  Laterality: N/A;   PERIPHERAL VASCULAR ATHERECTOMY Left 06/25/2019   Procedure: PERIPHERAL VASCULAR ATHERECTOMY;  Surgeon: Serene Gaile ORN, MD;  Location: [facility];  Service: Cardiovascular;  Laterality: Left;  superficial femoral   PERIPHERAL VASCULAR BALLOON ANGIOPLASTY Left 10/22/2019   Procedure: PERIPHERAL VASCULAR BALLOON ANGIOPLASTY;  Surgeon: Serene Gaile ORN, MD;  Location: [facility];  Service: Cardiovascular;  Laterality: Left;  superficial femoral   PERIPHERAL VASCULAR INTERVENTION  04/01/2020   Procedure:  PERIPHERAL VASCULAR INTERVENTION;  Surgeon: Gretta Lonni PARAS, MD;  Location: [facility];  Service: Cardiovascular;;  left SFA stent    RADIOLOGY WITH ANESTHESIA N/A 02/20/2019   Procedure: RADIOLOGY WITH ANESTHESIA   STENTING;  Surgeon: Dolphus Carrion, MD;  Location: [facility] OR;  Service: Radiology;  Laterality: N/A;   RADIOLOGY WITH ANESTHESIA N/A 03/25/2019   Procedure: RADIOLOGY WITH ANESTHESIA STENTING;  Surgeon: Dolphus Carrion, MD;  Location: [facility] OR;  Service: Radiology;  Laterality: N/A;   SKIN SPLIT GRAFT Left 07/03/2019   Procedure: APPLY SKIN GRAFT LEFT FOOT;  Surgeon: Harden Jerona GAILS, MD;  Location: [facility] OR;  Service: Orthopedics;  Laterality: Left;   TRACHEOSTOMY  11/13   closed 1/14   TRANSRECTAL DRAINAGE OF PELVIC ABSCESS  12/11   VASCULAR SURGERY  04/01/2020   two stents placed in left leg     Home Medications:  Prior to Admission medications   Medication Sig Start Date End Date Taking? Authorizing Provider  aspirin  81 MG EC tablet Take 81 mg by mouth daily.     [provider]  atorvastatin  (LIPITOR ) 80 MG tablet TAKE 1 TABLET BY MOUTH DAILY 01/10/23   Sheree Penne Lonni, MD  blood glucose meter kit and supplies Dispense based on patient and insurance preference. Use up to four times daily as directed. (FOR ICD-10 E10.9, E11.9). 07/05/19   Arrien, Elidia Sieving, MD  busPIRone  (BUSPAR ) 5 MG tablet Take 5 mg by mouth 3 (three) times daily. 12/21/23   [provider]  cyclobenzaprine (FLEXERIL) 10 MG tablet Take 10 mg by mouth 3 (three) times daily. 11/21/23   [provider]  dapagliflozin  propanediol (FARXIGA ) 10 MG TABS tablet Take 10 mg by mouth daily.     [provider]  diazepam  (VALIUM ) 5 MG tablet Take 5 mg by mouth 2 (two) times daily.     [provider]  DULoxetine  (CYMBALTA ) 60 MG capsule Take 60 mg by mouth daily.    [provider]  ferrous gluconate  (FERGON) 324 MG tablet Take 324 mg by mouth  daily.    [provider]  FLUoxetine  (PROZAC ) 10 MG capsule Take 10 mg by mouth in the morning. 11/27/19   [provider]  HYDROcodone -acetaminophen  (NORCO/VICODIN) 5-325 MG tablet Take 1 tablet by mouth every 6 (six) hours as needed for severe pain (pain score 7-10). 01/18/24   Mallipeddi, Vishnu P, MD  hydrOXYzine  (VISTARIL ) 25 MG capsule Take 25 mg by mouth 3 (three) times daily. 09/21/23   [provider]  ivabradine  (CORLANOR ) 7.5 MG TABS tablet Take 1 tablet (7.5 mg total) by mouth 2 (two) times daily with a meal. 01/18/24   Mallipeddi, Vishnu P, MD  levETIRAcetam  (KEPPRA  XR) 500 MG 24 hr tablet Take 2 tablets (1,000 mg total) by mouth daily. 05/08/23  Patel, Donika K, DO  magnesium  oxide (MAG-OX) 400 MG tablet Take 400 mg by mouth daily. 09/28/23 09/27/24  [provider]  metFORMIN  (GLUCOPHAGE ) 500 MG tablet Take 500 mg by mouth 3 (three) times daily. 11/16/18   [provider]  metoprolol  succinate (TOPROL -XL) 25 MG 24 hr tablet Take 0.5 tablets (12.5 mg total) by mouth daily. 10/27/23   Gonfa, Taye T, MD  nitroGLYCERIN  (NITROSTAT ) 0.4 MG SL tablet Place 1 tablet (0.4 mg total) under the tongue every 5 (five) minutes as needed for chest pain. 11/03/23 02/01/24  Clegg, Amy D, NP  pantoprazole  (PROTONIX ) 40 MG tablet Take 1 tablet (40 mg total) by mouth daily. 10/27/23   Gonfa, Taye T, MD  pregabalin  (LYRICA ) 150 MG capsule TAKE 1 CAPSULE BY MOUTH THREE TIMES DAILY 10/09/23   Patel, Donika K, DO  ranolazine  (RANEXA ) 1000 MG SR tablet Take 1 tablet (1,000 mg total) by mouth 2 (two) times daily. 01/18/24   Mallipeddi, Vishnu P, MD  risperiDONE  (RISPERDAL ) 0.5 MG tablet Take 0.5 mg by mouth at bedtime.    [provider]  sacubitril -valsartan  (ENTRESTO ) 24-26 MG Take 1 tablet by mouth 2 (two) times daily. 12/07/23   Mallipeddi, Vishnu P, MD  semaglutide -weight management (WEGOVY ) 0.25 MG/0.5ML SOAJ SQ injection Inject 0.25 mg into the skin once a week.     [provider]  spironolactone  (ALDACTONE ) 25 MG tablet Take 0.5 tablets (12.5 mg total) by mouth daily. 01/18/24 04/17/24  Mallipeddi, Vishnu P, MD  ticagrelor  (BRILINTA ) 90 MG TABS tablet Take 1 tablet (90 mg total) by mouth 2 (two) times daily. 10/26/23   Gonfa, Taye T, MD  tiZANidine  (ZANAFLEX ) 2 MG tablet TAKE 1 TABLET BY MOUTH AT BEDTIME AS NEEDED FOR MUSCLE SPASMS 07/15/21   Patel, Donika K, DO  traMADol  (ULTRAM ) 50 MG tablet Take 50 mg by mouth 4 (four) times daily. 01/13/21   [provider]  traZODone  (DESYREL ) 100 MG tablet Take 200 mg by mouth at bedtime.     [provider]    Scheduled Meds:  aspirin  EC  81 mg Oral Daily   atorvastatin   80 mg Oral Daily   azithromycin   500 mg Oral Daily   busPIRone   5 mg Oral TID   [START ON 01/20/2024] FLUoxetine   20 mg Oral q AM   hydrOXYzine   25 mg Oral TID   levETIRAcetam   1,000 mg Oral Daily   metoprolol  succinate  12.5 mg Oral Daily   pantoprazole   40 mg Oral Daily   ranolazine   1,000 mg Oral BID   risperiDONE   0.5 mg Oral QHS   sacubitril -valsartan   1 tablet Oral BID   ticagrelor   90 mg Oral BID   traZODone   200 mg Oral QHS   Continuous Infusions:  [START ON 01/20/2024] cefTRIAXone  (ROCEPHIN )  IV     heparin      PRN Meds: HYDROcodone -acetaminophen , nitroGLYCERIN   Allergies:    Allergies  Allergen Reactions   Amitriptyline Other (See Comments)    Severe dissociative state   Contrast Media [Iodinated Contrast Media] Other (See Comments)    2013-had CVA, went into AFR-combination ABT, contrast, low BP   Morphine  And Codeine Other (See Comments)    Chest pain   Promethazine  Swelling    Lips swell   Compazine Other (See Comments)    -Alert but unable to move   Prochlorperazine Maleate Other (See Comments)    -Alert, but unable to move   Adhesive [Tape] Rash    Long term  exposure causes skin to tear    Oxycodone  Nausea And Vomiting    Severe n/v -  patient will not take oxycodone  or percocet.    Sumatriptan Rash    Social History:   Social History   Socioeconomic History   Marital status: Single    Spouse name: Not on file   Number of children: 0   Years of education: 14   Highest education level: Not on file  Occupational History   Not on file  Tobacco Use   Smoking status: Former    Current packs/day: 0.00    Average packs/day: 0.6 packs/day for 23.0 years (13.8 ttl pk-yrs)    Types: Cigarettes    Start date: 02/05/1996    Quit date: 02/05/2019    Years since quitting: 4.9    Passive exposure: Never   Smokeless tobacco: Never  Vaping Use   Vaping status: Never Used  Substance and Sexual Activity   Alcohol use: No    Alcohol/week: 0.0 standard drinks of alcohol   Drug use: No   Sexual activity: Not Currently    Birth control/protection: None  Other Topics Concern   Not on file  Social History Narrative   She is single and does not have any children.    She has 2 yrs of college level education.   She has 5-6 caffeine drinks daily.    Left handed      Living with parents.   Social Drivers of Corporate investment banker Strain: Low Risk  (10/25/2023)   Overall Financial Resource Strain (CARDIA)    Difficulty of Paying Living Expenses: Not very hard  Food Insecurity: No Food Insecurity (06/30/2023)   Received from [facility]   Hunger Vital Sign    Within the past 12 months, you worried that your food would run out before you got the money to buy more.: Never true    Within the past 12 months, the food you bought just didn't last and you didn't have money to get more.: Never true  Transportation Needs: No Transportation Needs (10/25/2023)   PRAPARE - Administrator, Civil Service (Medical): No    Lack of Transportation (Non-Medical): No  Physical Activity: Insufficiently Active (06/30/2023)   Received from [facility]   Exercise Vital Sign    On average, how many days per week do you engage in moderate to strenuous exercise (like a brisk  walk)?: 2 days    On average, how many minutes do you engage in exercise at this level?: 30 min  Stress: No Stress Concern Present (06/30/2023)   Received from [facility] - Occupational Stress Questionnaire    Feeling of Stress : Not at all  Social Connections: Patient Unable To Answer (10/18/2023)   Social Connection and Isolation Panel    Frequency of Communication with Friends and Family: Patient unable to answer    Frequency of Social Gatherings with Friends and Family: Patient unable to answer    Attends Religious Services: Patient unable to answer    Active Member of Clubs or Organizations: Patient unable to answer    Attends Banker Meetings: Patient unable to answer    Marital Status: Patient unable to answer  Intimate Partner Violence: Unknown (09/16/2021)   Received from [facility]   HITS    Physically Hurt: Not on file    Insult or Talk Down To: Not on file  Threaten Physical Harm: Not on file    Scream or Curse: Not on file    Family History:    Family History  Problem Relation Age of Onset   Lung cancer Maternal Grandmother    Heart attack Maternal Grandfather    Diabetes Paternal Grandfather      ROS:  Please see the history of present illness.   All other ROS reviewed and negative.     Physical Exam/Data: Vitals:   01/19/24 1637  BP: 108/66  Resp: 18  Temp: 97.8 F (36.6 C)  TempSrc: Oral   No intake or output data in the 24 hours ending 01/19/24 1653    01/18/2024   10:24 AM 12/07/2023    3:25 PM 11/14/2023    2:37 PM  Last 3 Weights  Weight (lbs) 212 lb 220 lb 219 lb 1.6 oz  Weight (kg) 96.163 kg 99.791 kg 99.383 kg     There is no height or weight on file to calculate BMI.  General:  Well nourished, well developed. Uncomfortable/ill appearing. HEENT: normal Neck: no JVD Vascular: No carotid bruits; Distal pulses 2+ bilaterally Cardiac:  normal S1, S2; RRR;  Lungs:  clear to  auscultation bilaterally, no wheezing, rhonchi or rales. Poor inspiratory effort. Abd: soft, nontender, no hepatomegaly  Ext: no edema Musculoskeletal:  No deformities, BUE and BLE strength normal and equal Skin: warm and dry  Neuro:  CNs 2-12 intact, no focal abnormalities noted Psych:  Normal affect   EKG:  EKG pending. Telemetry:  Telemetry was personally reviewed and demonstrates:  sinus rhythm, HR high 90s.  Relevant CV Studies:  10/24/23 LHC               10/20/23 TTE 1. Septal , mid/basal inferior wall and lateral wall hypokinesis . Left  ventricular ejection fraction, by estimation, is 35 to 40%. The left  ventricle has moderately decreased function. The left ventricle  demonstrates regional wall motion abnormalities  (see scoring diagram/findings for description). The left ventricular  internal cavity size was mildly dilated. Left ventricular diastolic  parameters are consistent with Grade II diastolic dysfunction  (pseudonormalization). Elevated left ventricular  end-diastolic pressure.   2. Right ventricular systolic function is normal. The right ventricular  size is normal.   3. Left atrial size was moderately dilated.   4. Moderate subvalvular calcification of the posterior leaflet chords .  The mitral valve is abnormal. Trivial mitral valve regurgitation. No  evidence of mitral stenosis.   5. The aortic valve is tricuspid. Aortic valve regurgitation is not  visualized. No aortic stenosis is present.   6. The inferior vena cava is normal in size with greater than 50%  respiratory variability, suggesting right atrial pressure of 3 mmHg.   FINDINGS   Left Ventricle: Septal , mid/basal inferior wall and lateral wall  hypokinesis. Left ventricular ejection fraction, by estimation, is 35 to  40%. The left ventricle has moderately decreased function. The left  ventricle demonstrates regional wall motion  abnormalities. Strain was performed and the global longitudinal  strain is  indeterminate. The left ventricular internal cavity size was mildly  dilated. There is no left ventricular hypertrophy. Left ventricular  diastolic parameters are consistent with  Grade II diastolic dysfunction (pseudonormalization). Elevated left  ventricular end-diastolic pressure.   Right Ventricle: The right ventricular size is normal. No increase in  right ventricular wall thickness. Right ventricular systolic function is  normal.   Left Atrium: Left atrial size was moderately dilated.   Right  Atrium: Right atrial size was normal in size.   Pericardium: Trivial pericardial effusion is present. The pericardial  effusion is posterior to the left ventricle.   Mitral Valve: Moderate subvalvular calcification of the posterior leaflet  chords. The mitral valve is abnormal. There is mild thickening of the  mitral valve leaflet(s). There is mild calcification of the mitral valve  leaflet(s). Mild mitral annular  calcification. Trivial mitral valve regurgitation. No evidence of mitral  valve stenosis.   Tricuspid Valve: The tricuspid valve is normal in structure. Tricuspid  valve regurgitation is trivial. No evidence of tricuspid stenosis.   Aortic Valve: The aortic valve is tricuspid. Aortic valve regurgitation is  not visualized. No aortic stenosis is present.   Pulmonic Valve: The pulmonic valve was normal in structure. Pulmonic valve  regurgitation is not visualized. No evidence of pulmonic stenosis.   Aorta: The aortic root is normal in size and structure.   Venous: The inferior vena cava is normal in size with greater than 50%  respiratory variability, suggesting right atrial pressure of 3 mmHg.   IAS/Shunts: No atrial level shunt detected by color flow Doppler.   Additional Comments: 3D was performed not requiring image post processing  on an independent workstation and was indeterminate.    Laboratory Data: High Sensitivity Troponin:  No results for  input(s): TROPONINIHS in the last 720 hours.   ChemistryNo results for input(s): NA, K, CL, CO2, GLUCOSE, BUN, CREATININE, CALCIUM , MG, GFRNONAA, GFRAA, ANIONGAP in the last 168 hours.  No results for input(s): PROT, ALBUMIN, AST, ALT, ALKPHOS, BILITOT in the last 168 hours. Lipids No results for input(s): CHOL, TRIG, HDL, LABVLDL, LDLCALC, CHOLHDL in the last 168 hours.  HematologyNo results for input(s): WBC, RBC, HGB, HCT, MCV, MCH, MCHC, RDW, PLT in the last 168 hours. Thyroid No results for input(s): TSH, FREET4 in the last 168 hours.  BNPNo results for input(s): BNP, PROBNP in the last 168 hours.  DDimer No results for input(s): DDIMER in the last 168 hours.  Radiology/Studies:  No results found.   Assessment and Plan:  3 vessel CAD Chest pain Elevated troponin Patient admitted by cardiology in May of this year. She was found to have significant 3v CAD, turned down by CT surgery for CABG and had PCI of most significant lesion in LCX. Now on DAPT with ASA/Brilinta . Reports increasing exertional limitation/dyspnea with intermittent chest tightness that has been constant since yesterday. She did fall last night and was found to have L rib fractures on OSH CT chest but says pain from this is clearly different from chest pressure. OSH troponin I Troponin I 297->8701->18960.  Pneumonia reported by OSH. On my review of OSH imaging reports, changes to LL appear more chronic than acute. Given significant known CAD, patient's recent acceleration of symptoms, and troponin delta, high concern for ACS. Despite reporting chest pressure, patient has not yet received any nitroglycerin . Sublingual 0.4mg  dose ordered. If patient has pressure improvement, would start infusion.  Will need LHC. Has contrast allergy that will necessitate prophylaxis, solumedrol 125mg  with diphenhydramine  25mg .  Continue heparin . Continue home regimen  of Toprol  X 12.5mg , Ranexa  1000mg  BID, Atorvastatin  80mg . Continue DAPT with ASA and Brilinta .   Informed Consent   Shared Decision Making/Informed Consent The risks [stroke (1 in 1000), death (1 in 1000), kidney failure [usually temporary] (1 in 500), bleeding (1 in 200), allergic reaction [possibly serious] (1 in 200)], benefits (diagnostic support and management of coronary artery disease) and alternatives of a cardiac catheterization  were discussed in detail with Ms. Casique and she is willing to proceed.     Chronic HFrEF Ischemic cardiomyopathy Hypertension Patient with LVEF 35-40% per 10/20/23 TTE. Appears generally euvolemic today. PTA GDMT includes Farxiga  10mg , Ivabradine  7.5mg  BID. Reports stable 2 pillow orthopnea.  Continue Toprol  XL 12.5mg  Continue Ivabradine  7.5mg  BID Continue Entresto  24-26mg  BID Continue Spironolactone  12.5mg   Per primary team: Pneumonia DM Seizure disorder Chronic pain Rib fractures s/p fall  Risk Assessment/Risk Scores:    TIMI Risk Score for Unstable Angina or Non-ST Elevation MI:   The patient's TIMI risk score is 5, which indicates a 26% risk of all cause mortality, new or recurrent myocardial infarction or need for urgent revascularization in the next 14 days.  [place]  Heart Association (NYHA) Functional Class NYHA Class II/III       For questions or updates, please contact [facility] Please consult [email or web address] for contact info under    Signed, Artist Pouch, PA-C  01/19/2024 4:53 PM  Patient seen, examined. Available data reviewed. Agree with findings, assessment, and plan as outlined by Artist Pouch, PA-C.  The patient is independently interviewed and personally examined.  Her mother and her brother are present at the bedside.  She was transferred here today for non-STEMI.  The patient has a somewhat complicated history as she was hospitalized after a mechanical fall.  She has noted to have nondisplaced left 5th through  9th rib fractures and peribronchovascular groundglass opacities in the left lung.  She also has had a feeling of central chest discomfort and her troponin has trended from 297 at 4:45 AM today up to 8701 at 7:02 AM and then to 18,960 at 10:47 AM.  The patient has a history of complex three-vessel coronary artery disease, deemed to poor candidate for surgical revascularization because of her medical comorbidities and poor targets.  She underwent stenting of the left circumflex in May with recommendations for medical therapy for her residual CAD.  She has noted to have 50% distal left main stenosis, 80% proximal LAD stenosis, severe diffuse circumflex stenosis with previous stenting of the most critical lesion, and moderately severe proximal RCA and severe distal RCA stenoses.  On my exam, the patient is alert, oriented, in no distress.  She is morbidly obese.  HEENT is normal, JVP is normal, carotid upstrokes are 2+ without bruits, lungs are clear bilaterally, heart is regular rate and rhythm with no murmur gallop, abdomen is soft and obese, nontender with no masses, extremities have no edema, right radial pulses 2+.  EKG is normal sinus rhythm 99 bpm, possible inferior infarct age undetermined, cannot rule out anterior infarct age undetermined, no acute ST/T wave changes.  This patient with extensive coronary artery disease, morbid obesity, and uncontrolled diabetes now has a large non-ST elevation MI by cardiac enzymes.  In reviewing her previous cardiac catheterization films, she had severe multivessel disease with percutaneous treatment of a severe stenosis in the left circumflex.  She is at high risk of stent occlusion or progressive coronary disease.  Recommend proceeding urgently with cardiac catheterization and possible PCI in the setting of non-STEMI with ongoing ischemic symptoms and increasing high-sensitivity troponins. I have reviewed the risks, indications, and alternatives to cardiac catheterization,  possible angioplasty, and stenting with the patient. Risks include but are not limited to bleeding, infection, vascular injury, stroke, myocardial infection, arrhythmia, kidney injury, radiation-related injury in the case of prolonged fluoroscopy use, emergency cardiac surgery, and death. The patient understands the risks of serious complication  is 1-2 in 1000 with diagnostic cardiac cath and 1-2% or less with angioplasty/stenting.  The patient will be premedicated with Solu-Medrol  125 mg and Benadryl  25 mg IV for contrast allergy.  Ozell Fell, M.D. 01/19/2024 5:26 PM

## 2024-01-20 ENCOUNTER — Inpatient Hospital Stay (HOSPITAL_COMMUNITY)

## 2024-01-20 DIAGNOSIS — I214 Non-ST elevation (NSTEMI) myocardial infarction: Secondary | ICD-10-CM | POA: Diagnosis not present

## 2024-01-20 DIAGNOSIS — R079 Chest pain, unspecified: Secondary | ICD-10-CM | POA: Diagnosis not present

## 2024-01-20 DIAGNOSIS — R570 Cardiogenic shock: Secondary | ICD-10-CM

## 2024-01-20 LAB — CBC
HCT: 28.3 % — ABNORMAL LOW (ref 36.0–46.0)
Hemoglobin: 8 g/dL — ABNORMAL LOW (ref 12.0–15.0)
MCH: 19.3 pg — ABNORMAL LOW (ref 26.0–34.0)
MCHC: 28.3 g/dL — ABNORMAL LOW (ref 30.0–36.0)
MCV: 68.2 fL — ABNORMAL LOW (ref 80.0–100.0)
Platelets: 246 K/uL (ref 150–400)
RBC: 4.15 MIL/uL (ref 3.87–5.11)
RDW: 19.4 % — ABNORMAL HIGH (ref 11.5–15.5)
WBC: 22.5 K/uL — ABNORMAL HIGH (ref 4.0–10.5)
nRBC: 0 % (ref 0.0–0.2)

## 2024-01-20 LAB — HEPARIN LEVEL (UNFRACTIONATED)
Heparin Unfractionated: 0.16 [IU]/mL — ABNORMAL LOW (ref 0.30–0.70)
Heparin Unfractionated: 0.25 [IU]/mL — ABNORMAL LOW (ref 0.30–0.70)

## 2024-01-20 LAB — GLUCOSE, CAPILLARY
Glucose-Capillary: 193 mg/dL — ABNORMAL HIGH (ref 70–99)
Glucose-Capillary: 195 mg/dL — ABNORMAL HIGH (ref 70–99)
Glucose-Capillary: 150 mg/dL — ABNORMAL HIGH (ref 70–99)

## 2024-01-20 LAB — ECHOCARDIOGRAM COMPLETE
AR max vel: 1.47 cm2
AV Area VTI: 1.53 cm2
AV Area mean vel: 1.55 cm2
AV Mean grad: 4.8 mmHg
AV Peak grad: 10.3 mmHg
Ao pk vel: 1.6 m/s
Area-P 1/2: 5.13 cm2
Est EF: 35
S' Lateral: 4.17 cm

## 2024-01-20 LAB — BASIC METABOLIC PANEL WITH GFR
Anion gap: 9 (ref 5–15)
BUN: 13 mg/dL (ref 6–20)
CO2: 23 mmol/L (ref 22–32)
Calcium: 8.8 mg/dL — ABNORMAL LOW (ref 8.9–10.3)
Chloride: 105 mmol/L (ref 98–111)
Creatinine, Ser: 0.91 mg/dL (ref 0.44–1.00)
GFR, Estimated: >60 mL/min (ref >60)
Glucose, Bld: 226 mg/dL — ABNORMAL HIGH (ref 70–99)
Potassium: 4.1 mmol/L (ref 3.5–5.1)
Sodium: 137 mmol/L (ref 135–145)

## 2024-01-20 MED ORDER — HYDROCODONE-ACETAMINOPHEN 5-325 MG PO TABS
1.0000 | ORAL_TABLET | Freq: Four times a day (QID) | ORAL | Status: DC | PRN
  Administered 2024-01-20 – 2024-01-21 (×3): 2 via ORAL
  Filled 2024-01-20 (×3): qty 2

## 2024-01-20 MED ORDER — LIDOCAINE 5 % EX PTCH
1.0000 | MEDICATED_PATCH | Freq: Every day | CUTANEOUS | Status: DC
  Administered 2024-01-20 – 2024-01-23 (×6): 1 via TRANSDERMAL
  Filled 2024-01-20 (×4): qty 1

## 2024-01-20 MED ORDER — INSULIN ASPART 100 UNIT/ML IJ SOLN: 0.0000 [IU] | Freq: Every day | INTRAMUSCULAR | Status: DC

## 2024-01-20 MED ORDER — DIAZEPAM 5 MG PO TABS
5.0000 mg | ORAL_TABLET | Freq: Two times a day (BID) | ORAL | Status: DC
  Administered 2024-01-20 – 2024-01-23 (×10): 5 mg via ORAL
  Filled 2024-01-20 (×7): qty 1

## 2024-01-20 MED ORDER — FUROSEMIDE 10 MG/ML IJ SOLN
80.0000 mg | Freq: Once | INTRAMUSCULAR | Status: AC
  Administered 2024-01-20: 80 mg via INTRAVENOUS
  Filled 2024-01-20: qty 8

## 2024-01-20 MED ORDER — SPIRONOLACTONE 25 MG PO TABS
25.0000 mg | ORAL_TABLET | Freq: Every day | ORAL | Status: DC
Start: 2024-01-20 — End: 2024-01-23
  Administered 2024-01-20 – 2024-01-23 (×6): 25 mg via ORAL
  Filled 2024-01-20 (×4): qty 1

## 2024-01-20 MED ORDER — INSULIN ASPART 100 UNIT/ML IJ SOLN
0.0000 [IU] | Freq: Three times a day (TID) | INTRAMUSCULAR | Status: DC
  Administered 2024-01-20 – 2024-01-21 (×4): 3 [IU] via SUBCUTANEOUS
  Administered 2024-01-22 (×4): 2 [IU] via SUBCUTANEOUS
  Administered 2024-01-23 (×4): 3 [IU] via SUBCUTANEOUS

## 2024-01-20 MED ORDER — DAPAGLIFLOZIN PROPANEDIOL 10 MG PO TABS
10.0000 mg | ORAL_TABLET | Freq: Every day | ORAL | Status: DC
  Administered 2024-01-21 – 2024-01-23 (×5): 10 mg via ORAL
  Filled 2024-01-20 (×3): qty 1

## 2024-01-20 MED ORDER — IVABRADINE HCL 5 MG PO TABS: 5.0000 mg | ORAL_TABLET | Freq: Two times a day (BID) | ORAL | Status: DC

## 2024-01-20 MED ORDER — CYCLOBENZAPRINE HCL 10 MG PO TABS
10.0000 mg | ORAL_TABLET | Freq: Three times a day (TID) | ORAL | Status: DC
  Administered 2024-01-20 – 2024-01-23 (×14): 10 mg via ORAL
  Filled 2024-01-20 (×10): qty 1

## 2024-01-20 NOTE — Progress Notes (Signed)
 Advanced Heart Failure Rounding Note  Cardiologist: Vishnu SHAUNNA Maywood, MD  Chief Complaint: Chest pain Subjective:    Complaining of chest pain/pressure under her left breast and spreading to her sternum. Not improved with low dose pain medication. Would like to have multiple of her home medications restarted. Discussed cath findings.    Objective:   Weight Range:   There is no height or weight on file to calculate BMI.   Vital Signs:   Temp:  [96.6 F (35.9 C)-98.4 F (36.9 C)] 97 F (36.1 C) (08/09 1051) Pulse Rate:  [0-102] 90 (08/09 1100) Resp:  [17-43] 23 (08/09 1100) BP: (68-131)/(40-88) 120/75 (08/09 1100) SpO2:  [88 %-100 %] 98 % (08/09 1100) Last BM Date :  (PTA)  Weight change: There were no vitals filed for this visit.  Intake/Output:   Intake/Output Summary (Last 24 hours) at 01/20/2024 1138 Last data filed at 01/20/2024 1100 Gross per 24 hour  Intake 1874.9 ml  Output 2200 ml  Net -325.1 ml      Physical Exam    GENERAL: NAD, fair appearing PULM:  Normal work of breathing, CTAB CARDIAC:  JVP: flat         Normal rate with regular rhythm. No murmurs, rubs or gallops.  Trace edema. Warm and well perfused extremities. ABDOMEN: Soft, non-tender, non-distended. NEUROLOGIC: Patient is oriented x3 with no focal or lateralizing neurologic deficits.    Telemetry   Sinus rhythm in the 90s  Medications:     Scheduled Medications:  aspirin  EC  81 mg Oral Daily   atorvastatin   80 mg Oral Daily   azithromycin   500 mg Oral Daily   Chlorhexidine  Gluconate Cloth  6 each Topical Daily   cyclobenzaprine   10 mg Oral TID   [START ON 01/21/2024] dapagliflozin  propanediol  10 mg Oral Daily   diazepam   5 mg Oral BID   FLUoxetine   20 mg Oral q AM   free water   250 mL Oral Once   hydrOXYzine   25 mg Oral TID   insulin  aspart  0-15 Units Subcutaneous TID WC   insulin  aspart  0-5 Units Subcutaneous QHS   levETIRAcetam   1,000 mg Oral Daily   lidocaine   1 patch  Transdermal Daily   metoprolol  succinate  12.5 mg Oral Daily   pantoprazole   40 mg Oral Daily   ranolazine   1,000 mg Oral BID   risperiDONE   0.5 mg Oral QHS   sodium chloride  flush  3 mL Intravenous Q12H   spironolactone   25 mg Oral Daily   ticagrelor   90 mg Oral BID   traZODone   200 mg Oral QHS    Infusions:  sodium chloride  10 mL/hr at 01/20/24 1100   sodium chloride      sodium chloride      sodium chloride  20 mL/hr at 01/20/24 1100   sodium chloride  20 mL/hr at 01/20/24 1100   cefTRIAXone  (ROCEPHIN )  IV 200 mL/hr at 01/20/24 1100   heparin  1,150 Units/hr (01/20/24 1100)    PRN Medications: sodium chloride , acetaminophen , HYDROcodone -acetaminophen , nitroGLYCERIN , ondansetron  (ZOFRAN ) IV, sodium chloride  flush, traMADol     Patient Profile   Taylor [name] is a 43 y.o. female with a PMH of CAD, prior CVA, HFmrEF (ischemic), seizure disorder, chronic pain, DM, and BPD who presents with NSTEMI.  Assessment/Plan   Cardiogenic shock: NSTEMI on arrival, during procedure required vasopressors for hypotension. Moderately elevated filling pressures on RHC, now normalized this morning. Known ischemic HFmrEF.  - Repeat echo - IV lasix  80mg   x1 - Hold home entresto  - Off pressors - Remove swan and introducer - Stop ivabradine  - Continue home metoprolol  12.5mg  daily - Continue home spironolactone  25mg  daily - Downgrade to the floor  NSTEMI: Patient with rapidly rising trops, LHC with occluded RCA, subacute and give poor outflow elected to no perform primary PCI. - Still with ongoing chest pain, hydrocodone  for pain management - Continue heparin  for 48 hours - Continue aspirin /ticagrelor  for 12 months - Continue lipitor  80mg  daily  CAP: Significant leukocytosis, but chronic. - On CAP coverage per primary IM team  Chronic anemia: Hemoglobin 8-9. - Transfuse for >8  Fall at home Polypharmacy Chronic pain Bipolar disorder Cognitive decline Per primary team   Medication  concerns reviewed with patient and pharmacy team. Barriers identified: heparin  dosing  Length of Stay: 1  Morene JINNY Brownie, MD  01/20/2024, 11:38 AM  Advanced Heart Failure Team Pager [phone] (M-F; 7a - 5p)  Please contact [facility] for night-coverage after hours (5p -7a ) and weekends on [email or web address]

## 2024-01-20 NOTE — Plan of Care (Signed)
  Problem: Education: Goal: Knowledge of General Education information will improve Description: Including pain rating scale, medication(s)/side effects and non-pharmacologic comfort measures Outcome: Progressing   Problem: Health Behavior/Discharge Planning: Goal: Ability to manage health-related needs will improve Outcome: Progressing   Problem: Clinical Measurements: Goal: Ability to maintain clinical measurements within normal limits will improve Outcome: Progressing Goal: Will remain free from infection Outcome: Progressing Goal: Diagnostic test results will improve Outcome: Progressing Goal: Respiratory complications will improve Outcome: Progressing Goal: Cardiovascular complication will be avoided Outcome: Progressing   Problem: Activity: Goal: Risk for activity intolerance will decrease Outcome: Progressing   Problem: Nutrition: Goal: Adequate nutrition will be maintained Outcome: Progressing   Problem: Coping: Goal: Level of anxiety will decrease Outcome: Progressing   Problem: Elimination: Goal: Will not experience complications related to bowel motility Outcome: Progressing Goal: Will not experience complications related to urinary retention Outcome: Progressing   Problem: Pain Managment: Goal: General experience of comfort will improve and/or be controlled Outcome: Progressing   Problem: Safety: Goal: Ability to remain free from injury will improve Outcome: Progressing   Problem: Skin Integrity: Goal: Risk for impaired skin integrity will decrease Outcome: Progressing   Problem: Education: Goal: Understanding of CV disease, CV risk reduction, and recovery process will improve Outcome: Progressing Goal: Individualized Educational Video(s) Outcome: Progressing   Problem: Activity: Goal: Ability to return to baseline activity level will improve Outcome: Progressing   Problem: Cardiovascular: Goal: Ability to achieve and maintain adequate  cardiovascular perfusion will improve Outcome: Progressing Goal: Vascular access site(s) Level 0-1 will be maintained Outcome: Progressing   Problem: Health Behavior/Discharge Planning: Goal: Ability to safely manage health-related needs after discharge will improve Outcome: Progressing   Problem: Education: Goal: Understanding of CV disease, CV risk reduction, and recovery process will improve Outcome: Progressing Goal: Individualized Educational Video(s) Outcome: Progressing   Problem: Activity: Goal: Ability to return to baseline activity level will improve Outcome: Progressing   Problem: Cardiovascular: Goal: Ability to achieve and maintain adequate cardiovascular perfusion will improve Outcome: Progressing Goal: Vascular access site(s) Level 0-1 will be maintained Outcome: Progressing   Problem: Health Behavior/Discharge Planning: Goal: Ability to safely manage health-related needs after discharge will improve Outcome: Progressing   Problem: Education: Goal: Ability to describe self-care measures that may prevent or decrease complications (Diabetes Survival Skills Education) will improve Outcome: Progressing Goal: Individualized Educational Video(s) Outcome: Progressing   Problem: Coping: Goal: Ability to adjust to condition or change in health will improve Outcome: Progressing   Problem: Fluid Volume: Goal: Ability to maintain a balanced intake and output will improve Outcome: Progressing   Problem: Health Behavior/Discharge Planning: Goal: Ability to identify and utilize available resources and services will improve Outcome: Progressing Goal: Ability to manage health-related needs will improve Outcome: Progressing   Problem: Metabolic: Goal: Ability to maintain appropriate glucose levels will improve Outcome: Progressing   Problem: Nutritional: Goal: Maintenance of adequate nutrition will improve Outcome: Progressing Goal: Progress toward achieving an  optimal weight will improve Outcome: Progressing   Problem: Skin Integrity: Goal: Risk for impaired skin integrity will decrease Outcome: Progressing   Problem: Tissue Perfusion: Goal: Adequacy of tissue perfusion will improve Outcome: Progressing

## 2024-01-20 NOTE — Progress Notes (Signed)
 PHARMACY - ANTICOAGULATION CONSULT NOTE  Pharmacy Consult for heparin  Indication: chest pain/ACS  Allergies  Allergen Reactions   Amitriptyline Other (See Comments)    Severe dissociative state   Contrast Media [Iodinated Contrast Media] Other (See Comments)    2013-had CVA, went into AFR-combination ABT, contrast, low BP   Morphine  And Codeine Other (See Comments)    Chest pain   Promethazine  Swelling    Lips swell   Compazine Other (See Comments)    -Alert but unable to move   Prochlorperazine Maleate Other (See Comments)    -Alert, but unable to move   Adhesive [Tape] Rash    Long term exposure causes skin to tear    Oxycodone  Nausea And Vomiting    Severe n/v -  patient will not take oxycodone  or percocet.   Sumatriptan Rash    Patient Measurements: Dosing body weight 76kg  Labs: Recent Labs    01/19/24 1825 01/19/24 2033 01/19/24 2041 01/20/24 0530 01/20/24 1513  HGB 9.5*  --  8.2* 8.0*  --   HCT 28.0*  --  29.1* 28.3*  --   PLT  --   --  233 246  --   HEPARINUNFRC  --   --   --  0.16* 0.25*  CREATININE  --  0.97  --  0.91  --   TROPONINIHS  --  7,390*  --   --   --     Estimated Creatinine Clearance: 88.8 mL/min (by C-G formula based on SCr of 0.91 mg/dL).   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   due to elevated blood sugar 1700-off insulin  6 months   Depression    Diabetes mellitus    off insulin  6 months   Diverticulitis    Dyspnea    when walking   GERD (gastroesophageal reflux disease)    Hypertension    Kidney dialysis status    on dialysis(stroke) off after 4-6 weeks   Pneumonia 2013, 06/2018   Respiratory failure (HCC) 04/2012   with stroke - in hosp [facility] 12 weeks   Seizures (HCC)    during elvated blood sugsr episode 11/13   Sepsis (HCC)    Stroke (HCC)    11/2018, 12/2018  Weak left side, speech- slurred, Short term memeroy, Gait unsteady    Vocal cord dysfunction     Medications:  Medications Prior to Admission  Medication Sig Dispense Refill Last Dose/Taking   aspirin  81 MG EC tablet Take 81 mg by mouth daily.    01/18/2024   atorvastatin  (LIPITOR ) 80 MG tablet TAKE 1 TABLET BY MOUTH DAILY 30 tablet 0 01/18/2024   busPIRone  (BUSPAR ) 5 MG tablet Take 5 mg by mouth 3 (three) times daily.   01/18/2024   cyclobenzaprine  (FLEXERIL ) 10 MG tablet Take 10 mg by mouth 3 (three) times daily.   01/18/2024   dapagliflozin  propanediol (FARXIGA ) 10 MG TABS tablet Take 10 mg by mouth daily.    01/18/2024   diazepam  (VALIUM ) 5 MG tablet Take 5 mg by mouth 2 (two) times daily.    01/18/2024   ferrous gluconate  (FERGON) 324 MG tablet Take 324 mg by mouth daily.   01/18/2024   FLUoxetine  (PROZAC ) 10 MG capsule Take 20 mg by mouth in the morning.   01/18/2024   HYDROcodone -acetaminophen  (NORCO/VICODIN) 5-325 MG tablet Take 1  tablet by mouth every 6 (six) hours as needed for severe pain (pain score 7-10). 10 tablet 0 01/18/2024   hydrOXYzine  (VISTARIL ) 25 MG capsule Take 25 mg by mouth 3 (three) times daily.   01/18/2024   ivabradine  (CORLANOR ) 7.5 MG TABS tablet Take 1 tablet (7.5 mg total) by mouth 2 (two) times daily with a meal. 60 tablet 5 01/18/2024   levETIRAcetam  (KEPPRA  XR) 500 MG 24 hr tablet Take 2 tablets (1,000 mg total) by mouth daily. 180 tablet 3 01/18/2024   magnesium  oxide (MAG-OX) 400 MG tablet Take 400 mg by mouth daily.   01/18/2024   metFORMIN  (GLUCOPHAGE ) 500 MG tablet Take 500 mg by mouth 3 (three) times daily.   01/18/2024   metoprolol  succinate (TOPROL -XL) 25 MG 24 hr tablet Take 0.5 tablets (12.5 mg total) by mouth daily. 45 tablet 0 01/18/2024   nitroGLYCERIN  (NITROSTAT ) 0.4 MG SL tablet Place 1 tablet (0.4 mg total) under the tongue every 5 (five) minutes as needed for chest pain. 90 tablet 3 Taking As Needed   OZEMPIC , 0.25 OR 0.5 MG/DOSE, 2 MG/3ML SOPN Inject 0.25 mg into the  skin once a week. Mondays   01/08/2024   pantoprazole  (PROTONIX ) 40 MG tablet Take 1 tablet (40 mg total) by mouth daily. 30 tablet 0 01/18/2024   pregabalin  (LYRICA ) 150 MG capsule TAKE 1 CAPSULE BY MOUTH THREE TIMES DAILY 90 capsule 5 01/18/2024   ranolazine  (RANEXA ) 1000 MG SR tablet Take 1 tablet (1,000 mg total) by mouth 2 (two) times daily. 60 tablet 5 01/18/2024   risperiDONE  (RISPERDAL ) 0.5 MG tablet Take 0.5 mg by mouth at bedtime.   01/18/2024   sacubitril -valsartan  (ENTRESTO ) 24-26 MG Take 1 tablet by mouth 2 (two) times daily. 60 tablet 6 01/18/2024   spironolactone  (ALDACTONE ) 25 MG tablet Take 0.5 tablets (12.5 mg total) by mouth daily. 45 tablet 3 Taking   ticagrelor  (BRILINTA ) 90 MG TABS tablet Take 1 tablet (90 mg total) by mouth 2 (two) times daily. 180 tablet 0 01/18/2024   tiZANidine  (ZANAFLEX ) 2 MG tablet TAKE 1 TABLET BY MOUTH AT BEDTIME AS NEEDED FOR MUSCLE SPASMS 30 tablet 2 01/18/2024   traMADol  (ULTRAM ) 50 MG tablet Take 50 mg by mouth 4 (four) times daily.   01/18/2024   traZODone  (DESYREL ) 100 MG tablet Take 200 mg by mouth at bedtime.    01/18/2024   blood glucose meter kit and supplies Dispense based on patient and insurance preference. Use up to four times daily as directed. (FOR ICD-10 E10.9, E11.9). 1 each 0    Scheduled:   aspirin  EC  81 mg Oral Daily   atorvastatin   80 mg Oral Daily   azithromycin   500 mg Oral Daily   Chlorhexidine  Gluconate Cloth  6 each Topical Daily   cyclobenzaprine   10 mg Oral TID   [START ON 01/21/2024] dapagliflozin  propanediol  10 mg Oral Daily   diazepam   5 mg Oral BID   FLUoxetine   20 mg Oral q AM   free water   250 mL Oral Once   hydrOXYzine   25 mg Oral TID   insulin  aspart  0-15 Units Subcutaneous TID WC   insulin  aspart  0-5 Units Subcutaneous QHS   levETIRAcetam   1,000 mg Oral Daily   lidocaine   1 patch Transdermal Daily   metoprolol  succinate  12.5 mg Oral Daily   pantoprazole   40 mg Oral Daily   ranolazine   1,000 mg Oral BID   risperiDONE    0.5 mg Oral QHS  sodium chloride  flush  3 mL Intravenous Q12H   spironolactone   25 mg Oral Daily   ticagrelor   90 mg Oral BID   traZODone   200 mg Oral QHS    Assessment: 43 yo female with CP and hx CAD. Pharmacy consulted to dose heparin . She is not on anticoagulation at home, She was transferred from [facility] and is on heparin  at 1000 units/hr. Patient now d/p cath with no PCI, resumed heparin  per Dr. Wendel.   Heparin  drip 1150 uts/hr with heparin  level 0.25 slightly subtherapeutic.  No issues with infusion running or signs of bleeding per RN. Hgb 8 stable  PLT 246. Plan for 48 hours of heparin  per cards post  cath - no intervention.   Goal of Therapy:  Heparin  level 0.3-0.7 units/ml Monitor platelets by anticoagulation protocol: Yes   Plan -Increase heparin  1200 units/hr -Monitor heparin  level, CBC, and s/sx of bleeding daily   Olam Chalk Pharm.D. CPP, BCPS Clinical Pharmacist [phone] 01/20/2024 4:20 PM

## 2024-01-20 NOTE — Progress Notes (Signed)
  Echocardiogram 2D Echocardiogram has been performed.  Koleen KANDICE Popper, RDCS 01/20/2024, 11:59 AM

## 2024-01-20 NOTE — Hospital Course (Addendum)
 f BPD, multivessel CAD, prior CVA, ischemic cardiomyopathy, seizure disorder, chronic pain, HTN, PAD, and DM2 who is a poor historian and has some noticeable cognitive decline, who had ground-level fall at home and brought to the ED,Pt was unable to get herself up, and was helped up by her father, with whom she resides, within minutes (pt was down for an unknown amount of time per signout bedside RN, Jolaine, received).  In the OSH ED significantly elevated troponin  troponin 200s-->8000s-->18000s. CTA chest showed nondisplaced left 5th through 9th rib fractures,  left lung predominant peribronchovascular groundglass opacities and patchy consolidations, may represent pneumonia, pulmonary hemorrhage, and/or aspiration She was started on IV abx. EDP started hep gtt for ACS and consulted cardiology at [facility] and transferred for  NSTEMI Recent admission 10/2023 needing LHC with multivessel disease, but CABG deferred as patient deemed poor surgical candidate and had PCI with DES to mid/distal L CX. Patient transferred out of ICU 8/9 Overall she is doing well currently on medical management, Remains medically stable  Subjective: Seen and examined this morning Patient's brother at the bedside, overall no new complaints feeling much better Overnight afebrile vital stable on room air Labs reviewed this morning leukocytosis resolved 9.4 hemoglobin of 8.1 renal function is stable Off heparin  drip  Discharge diagnosis:  NSTEMI Multivessel CAD-recent PCI in May, poor candidate for CABG Ischemic cardiomyopathy Cardiogenic shock due to NSTEMI-needing vasopressors during procedure-subsequently discontinued. Chronic systolic CHF previous EF 35-40% in May/9/25: S/P urgent cardiac cath 8/8, with subacute occlusion distal RCA-on medical therapy with DAPT, Lipitor .  Off heparin  Needed vasopressors during procedure.Vitals stable. TTE- LEF ~35% indeterminate diastolic  fun,normal MV. Continue current DAPT, Toprol ,  Aldactone , Ranexa  and Lipitor  80.  Volume status stable Discussed w/ Dr Jeffrie form cardiology and ok for home today  CAP with significant leukocytosis: WBC has normalized.  Respiratory status stable.  Continue current antibiotics with Rocephin , completed Zithromax    Seizure disorder: Stable, continue Keppra   Chronic anemia: Baseline ~8 to 9 g: Noted some drop, but overall is stable. Anemia panel in may slightly low iron  otherwise unremarkablke-added iron  po  Fall at home Physical deconditioning Polypharmacy Chronic pain Nondisplaced left 5th through 9th rib fracture in CT at OSH: Continue multimodal pain management-does have allergy to morphine  oxycodone  but able to take Norco WHICH she is on chronically Continue  Lidocaine  patch, hydrocodone  PT OT IS, pulmonary toileting Continue home Flexeril , Valium  twice daily, fluoxetine -minimize sedatives baclofen , Lyrica  for now Mobility: PT Orders: Active PT Follow up Rec: Home Health Pt8/03/2024 1411   TOC unable to find HH so OP PT will be arranged after discussion with patient and family.  Bipolar disorder Cognitive decline: Mood stable, continue Risperdal  at bedtime trazodone , supportive care delirium precaution fall precaution Reports her family manages her medication at home.  She is unable to tell her doctor she usually follows   Obesity There is no height or weight on file to calculate BMI.: Will benefit with PCP follow-up, weight loss,healthy lifestyle and outpatient sleep eval if not done.  Mobility: PT Orders: cont ptit PT Follow up Rec: Home Health Pt8/03/2024 1411   DVT prophylaxis: HEPARIN  GTT Code Status:   Code Status: Full Code Family Communication: plan of care discussed with patient at bedside. Patient status is: Remains hospitalized because of severity of illness Level of care: Telemetry Cardiac    Dispo: The patient is from: Home lives with parents            Anticipated disposition: Home with home  health  today if okay with cardiology  Objective: Vitals last 24 hrs: Vitals:   01/22/24 0516 01/22/24 0808 01/22/24 0914 01/22/24 1152  BP: 110/69 112/72 112/72 123/86  Pulse: 91 84 85 94  Resp: 17 17  20   Temp: 98.4 F (36.9 C) 97.6 F (36.4 C)  97.8 F (36.6 C)  TempSrc: Oral Oral  Oral  SpO2: 93% 90%  97%    Physical Examination: General exam: AAOX3, Obese HEENT:Oral mucosa moist, Ear/Nose WNL grossly Respiratory system: Bilaterally clear BS,no use of accessory muscle Cardiovascular system: S1 & S2 +, No JVD. Gastrointestinal system: Abdomen soft, OBESE, NT,ND, BS+ Nervous System: Alert, awake, moving all extremities,and following commands. Extremities: LE edema neg, distal extremities warm.  Skin: No rashes,no icterus. MSK: Normal muscle bulk,tone, power   Medications reviewed:  Scheduled Meds:  aspirin  EC  81 mg Oral Daily   atorvastatin   80 mg Oral Daily   Chlorhexidine  Gluconate Cloth  6 each Topical Daily   cyclobenzaprine   10 mg Oral TID   dapagliflozin  propanediol  10 mg Oral Daily   diazepam   5 mg Oral BID   FLUoxetine   20 mg Oral q AM   free water   250 mL Oral Once   hydrOXYzine   25 mg Oral TID   insulin  aspart  0-15 Units Subcutaneous TID WC   insulin  aspart  0-5 Units Subcutaneous QHS   levETIRAcetam   1,000 mg Oral Daily   lidocaine   1 patch Transdermal Daily   metoprolol  succinate  12.5 mg Oral Daily   pantoprazole   40 mg Oral Daily   ranolazine   1,000 mg Oral BID   risperiDONE   0.5 mg Oral QHS   sodium chloride  flush  3 mL Intravenous Q12H   spironolactone   25 mg Oral Daily   ticagrelor   90 mg Oral BID   traZODone   200 mg Oral QHS   Continuous Infusions:  sodium chloride  10 mL/hr at 01/20/24 1600   cefTRIAXone  (ROCEPHIN )  IV 1 g (01/22/24 0929)   Diet: Diet Order             Diet - low sodium heart healthy           Diet Heart Room service appropriate? Yes; Fluid consistency: Thin  Diet effective now

## 2024-01-20 NOTE — Progress Notes (Signed)
 PHARMACY - ANTICOAGULATION CONSULT NOTE  Pharmacy Consult for heparin  Indication: chest pain/ACS  Allergies  Allergen Reactions   Amitriptyline Other (See Comments)    Severe dissociative state   Contrast Media [Iodinated Contrast Media] Other (See Comments)    2013-had CVA, went into AFR-combination ABT, contrast, low BP   Morphine  And Codeine Other (See Comments)    Chest pain   Promethazine  Swelling    Lips swell   Compazine Other (See Comments)    -Alert but unable to move   Prochlorperazine Maleate Other (See Comments)    -Alert, but unable to move   Adhesive [Tape] Rash    Long term exposure causes skin to tear    Oxycodone  Nausea And Vomiting    Severe n/v -  patient will not take oxycodone  or percocet.   Sumatriptan Rash    Patient Measurements: Dosing body weight 76kg  Labs: Recent Labs    01/19/24 1825 01/19/24 2033 01/19/24 2041 01/20/24 0530  HGB 9.5*  --  8.2* 8.0*  HCT 28.0*  --  29.1* 28.3*  PLT  --   --  233 246  HEPARINUNFRC  --   --   --  0.16*  CREATININE  --  0.97  --  0.91  TROPONINIHS  --  7,390*  --   --     Estimated Creatinine Clearance: 88.8 mL/min (by C-G formula based on SCr of 0.91 mg/dL).   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   due to elevated blood sugar 1700-off insulin  6 months   Depression    Diabetes mellitus    off insulin  6 months   Diverticulitis    Dyspnea    when walking   GERD (gastroesophageal reflux disease)    Hypertension    Kidney dialysis status    on dialysis(stroke) off after 4-6 weeks   Pneumonia 2013, 06/2018   Respiratory failure (HCC) 04/2012   with stroke - in hosp [facility] 12 weeks   Seizures (HCC)    during elvated blood sugsr episode 11/13   Sepsis (HCC)    Stroke (HCC)    11/2018, 12/2018 Weak left side, speech- slurred, Short term  memeroy, Gait unsteady    Vocal cord dysfunction     Medications:  Medications Prior to Admission  Medication Sig Dispense Refill Last Dose/Taking   aspirin  81 MG EC tablet Take 81 mg by mouth daily.    01/18/2024   atorvastatin  (LIPITOR ) 80 MG tablet TAKE 1 TABLET BY MOUTH DAILY 30 tablet 0 01/18/2024   busPIRone  (BUSPAR ) 5 MG tablet Take 5 mg by mouth 3 (three) times daily.   01/18/2024   cyclobenzaprine  (FLEXERIL ) 10 MG tablet Take 10 mg by mouth 3 (three) times daily.   01/18/2024   dapagliflozin  propanediol (FARXIGA ) 10 MG TABS tablet Take 10 mg by mouth daily.    01/18/2024   diazepam  (VALIUM ) 5 MG tablet Take 5 mg by mouth 2 (two) times daily.    01/18/2024   ferrous gluconate  (FERGON) 324 MG tablet Take 324 mg by mouth daily.   01/18/2024   FLUoxetine  (PROZAC ) 10 MG capsule Take 20 mg by mouth in the morning.   01/18/2024   HYDROcodone -acetaminophen  (NORCO/VICODIN) 5-325 MG tablet Take 1 tablet by mouth every 6 (six) hours as needed for severe pain (pain score 7-10). 10 tablet 0  01/18/2024   hydrOXYzine  (VISTARIL ) 25 MG capsule Take 25 mg by mouth 3 (three) times daily.   01/18/2024   ivabradine  (CORLANOR ) 7.5 MG TABS tablet Take 1 tablet (7.5 mg total) by mouth 2 (two) times daily with a meal. 60 tablet 5 01/18/2024   levETIRAcetam  (KEPPRA  XR) 500 MG 24 hr tablet Take 2 tablets (1,000 mg total) by mouth daily. 180 tablet 3 01/18/2024   magnesium  oxide (MAG-OX) 400 MG tablet Take 400 mg by mouth daily.   01/18/2024   metFORMIN  (GLUCOPHAGE ) 500 MG tablet Take 500 mg by mouth 3 (three) times daily.   01/18/2024   metoprolol  succinate (TOPROL -XL) 25 MG 24 hr tablet Take 0.5 tablets (12.5 mg total) by mouth daily. 45 tablet 0 01/18/2024   nitroGLYCERIN  (NITROSTAT ) 0.4 MG SL tablet Place 1 tablet (0.4 mg total) under the tongue every 5 (five) minutes as needed for chest pain. 90 tablet 3 Taking As Needed   OZEMPIC , 0.25 OR 0.5 MG/DOSE, 2 MG/3ML SOPN Inject 0.25 mg into the skin once a week. Mondays   01/08/2024    pantoprazole  (PROTONIX ) 40 MG tablet Take 1 tablet (40 mg total) by mouth daily. 30 tablet 0 01/18/2024   pregabalin  (LYRICA ) 150 MG capsule TAKE 1 CAPSULE BY MOUTH THREE TIMES DAILY 90 capsule 5 01/18/2024   ranolazine  (RANEXA ) 1000 MG SR tablet Take 1 tablet (1,000 mg total) by mouth 2 (two) times daily. 60 tablet 5 01/18/2024   risperiDONE  (RISPERDAL ) 0.5 MG tablet Take 0.5 mg by mouth at bedtime.   01/18/2024   sacubitril -valsartan  (ENTRESTO ) 24-26 MG Take 1 tablet by mouth 2 (two) times daily. 60 tablet 6 01/18/2024   spironolactone  (ALDACTONE ) 25 MG tablet Take 0.5 tablets (12.5 mg total) by mouth daily. 45 tablet 3 Taking   ticagrelor  (BRILINTA ) 90 MG TABS tablet Take 1 tablet (90 mg total) by mouth 2 (two) times daily. 180 tablet 0 01/18/2024   tiZANidine  (ZANAFLEX ) 2 MG tablet TAKE 1 TABLET BY MOUTH AT BEDTIME AS NEEDED FOR MUSCLE SPASMS 30 tablet 2 01/18/2024   traMADol  (ULTRAM ) 50 MG tablet Take 50 mg by mouth 4 (four) times daily.   01/18/2024   traZODone  (DESYREL ) 100 MG tablet Take 200 mg by mouth at bedtime.    01/18/2024   blood glucose meter kit and supplies Dispense based on patient and insurance preference. Use up to four times daily as directed. (FOR ICD-10 E10.9, E11.9). 1 each 0    Scheduled:   aspirin  EC  81 mg Oral Daily   atorvastatin   80 mg Oral Daily   azithromycin   500 mg Oral Daily   Chlorhexidine  Gluconate Cloth  6 each Topical Daily   FLUoxetine   20 mg Oral q AM   free water   250 mL Oral Once   hydrOXYzine   25 mg Oral TID   ivabradine   7.5 mg Oral BID WC   levETIRAcetam   1,000 mg Oral Daily   methocarbamol   500 mg Oral TID   metoprolol  succinate  12.5 mg Oral Daily   pantoprazole   40 mg Oral Daily   ranolazine   1,000 mg Oral BID   risperiDONE   0.5 mg Oral QHS   sacubitril -valsartan   1 tablet Oral BID   sodium chloride  flush  3 mL Intravenous Q12H   spironolactone   12.5 mg Oral Daily   ticagrelor   90 mg Oral BID   traZODone   200 mg Oral QHS    Assessment: 44 yo female  with CP and hx CAD. Pharmacy consulted to dose  heparin . She is not on anticoagulation at home, She was transferred from [facility] and is on heparin  at 1000 units/hr. Patient now d/p cath with no PCI, resumed heparin  per Dr. Wendel.   8/9 AM: Heparin  level 0.16, subtherapeutic on heparin  1000 units/hr. No issues with infusion running or signs of bleeding per RN. Hgb decreasing to 8.0, PLT 246. Plan for 48 hours of heparin  per Dr. Wendel.   Goal of Therapy:  Heparin  level 0.3-0.7 units/ml Monitor platelets by anticoagulation protocol: Yes   Plan -Increase heparin  1150 units/hr -Heparin  level in 6 hours -Monitor heparin  level, CBC, and s/sx of bleeding daily  Morna Breach, PharmD PGY2 Cardiology Pharmacy Resident 01/20/2024 6:55 AM

## 2024-01-20 NOTE — Progress Notes (Signed)
 PROGRESS NOTE Taylor [name]  FMW:[id number] DOB: 03/12/1981 DOA: 01/19/2024 PCP: [name], NP  Brief Narrative/Hospital Course: f BPD, multivessel CAD, prior CVA, ischemic cardiomyopathy, seizure disorder, chronic pain, HTN, PAD, and DM2 who is a poor historian and has some noticeable cognitive decline, who had ground-level fall at home and brought to the ED,Pt was unable to get herself up, and was helped up by her father, with whom she resides, within minutes (pt was down for an unknown amount of time per signout bedside RN, Jolaine, received).  In the OSH ED significantly elevated troponin  troponin 200s-->8000s-->18000s. CTA chest showed nondisplaced left 5th through 9th rib fractures,  left lung predominant peribronchovascular groundglass opacities and patchy consolidations, may represent pneumonia, pulmonary hemorrhage, and/or aspiration She was started on IV abx. EDP started hep gtt for ACS and consulted cardiology at [facility] and transferred for  NSTEMI Recent admission 10/2023 needing LHC with multivessel disease, but CABG deferred as patient deemed poor surgical candidate and had PCI with DES to mid/distal L CX.  Subjective: Seen and examined today [place] on the right neck-being discontinued this morning Overnight temperature on slightly lower side heart rate is stable BP soft 90s-100, saturating on 4 L [place] Blood sugar 240s BMP stable CBC with significant leukocytosis hemoglobin 8.0 She is asking for more hydrocodone -reports her family manages her medication at home  Assessment and plan:  NSTEMI Multivessel CAD-recent PCI in May, poor candidate for CABG Ischemic cardiomyopathy Chronic systolic CHF previous EF 35-40% in May/9/25: Underwent cardiac catheter urgently 8/8, with subacute occluSION DISTAL RCS-advised medical therapy with heparin  drip dual antiplatelet-aspirin , Brilinta , Lipitor  80, Corlanor , Toprol , Ranexa , pain management-with scheduled Toradol  has allergy to morphine  and  codeine.Follow echocardiogram, continue home CHF meds with Aldactone , Entresto  per cardiology/CHF team  CAP: Significant leukocytosis noted.  Also hypoxic on oxygen.  Continue ceftriaxone  Netromycin pulmonary toileting  Seizure disorder: Continue Keppra   Chronic anemia: hemoglobin at baseline around 8 to 9 g: Monitor.  Fall at home Physical deconditioning Polypharmacy Chronic pain Nondisplaced left 5th through 9th rib fracture: Patient on Toradol  along with pain management, with Norco, add lidocaine  patch  Continue PT OT.  Holding sedatives/Valium  Lyrica  baclofen  and Flexeril  for now-will likely not resume  Bipolar disorder Cognitive decline Chronic pain on narcotic: Continue Risperdal  at bedtime trazodone , supportive care delirium precaution fall precaution  Obesity There is no height or weight on file to calculate BMI.: Will benefit with PCP follow-up, weight loss,healthy lifestyle and outpatient sleep eval if not done.  Mobility: PT Orders:  PT Follow up Rec:    DVT prophylaxis:  Code Status:   Code Status: Full Code Family Communication: plan of care discussed with patient at bedside. Patient status is: Remains hospitalized because of severity of illness Level of care: Telemetry Cardiac  being downgraded to telemetry TODAY  Dispo: The patient is from: Home lives with parents            Anticipated disposition: TBD.  Will add PT OT eval from tomorrow Objective: Vitals last 24 hrs: Vitals:   01/20/24 1000 01/20/24 1020 01/20/24 1051 01/20/24 1100  BP: 109/73 109/73  120/75  Pulse: 93 91 99 90  Resp: (!) 23  (!) 22 (!) 23  Temp: (!) 97.2 F (36.2 C)  (!) 97 F (36.1 C)   TempSrc:      SpO2: 98%  98% 98%    Physical Examination: General exam: alert awake, obese,  HEENT:Oral mucosa moist, Ear/Nose WNL grossly Respiratory system: Bilaterally clear BS,no use  of accessory muscle Cardiovascular system: S1 & S2 +, No JVD. Gastrointestinal system: Abdomen  soft,NT,ND, BS+ Nervous System: Alert, awake, moving all extremities,and following commands. Extremities: LE edema neg, distal extremities warm.  Skin: No rashes,no icterus. MSK: Normal muscle bulk,tone, power   Medications reviewed:  Scheduled Meds:  aspirin  EC  81 mg Oral Daily   atorvastatin   80 mg Oral Daily   azithromycin   500 mg Oral Daily   Chlorhexidine  Gluconate Cloth  6 each Topical Daily   cyclobenzaprine   10 mg Oral TID   [START ON 01/21/2024] dapagliflozin  propanediol  10 mg Oral Daily   diazepam   5 mg Oral BID   FLUoxetine   20 mg Oral q AM   free water   250 mL Oral Once   hydrOXYzine   25 mg Oral TID   insulin  aspart  0-15 Units Subcutaneous TID WC   insulin  aspart  0-5 Units Subcutaneous QHS   levETIRAcetam   1,000 mg Oral Daily   lidocaine   1 patch Transdermal Daily   metoprolol  succinate  12.5 mg Oral Daily   pantoprazole   40 mg Oral Daily   ranolazine   1,000 mg Oral BID   risperiDONE   0.5 mg Oral QHS   sodium chloride  flush  3 mL Intravenous Q12H   spironolactone   25 mg Oral Daily   ticagrelor   90 mg Oral BID   traZODone   200 mg Oral QHS   Continuous Infusions:  sodium chloride  10 mL/hr at 01/20/24 1100   sodium chloride      sodium chloride      sodium chloride  20 mL/hr at 01/20/24 1100   sodium chloride  20 mL/hr at 01/20/24 1100   cefTRIAXone  (ROCEPHIN )  IV 200 mL/hr at 01/20/24 1100   heparin  1,150 Units/hr (01/20/24 1100)   Diet: Diet Order             Diet Heart Room service appropriate? Yes; Fluid consistency: Thin  Diet effective now                   Data Reviewed: I have personally reviewed following labs and imaging studies ( see epic result tab) CBC: Recent Labs  Lab 01/19/24 1823 01/19/24 1824 01/19/24 1825 01/19/24 2041 01/20/24 0530  WBC  --   --   --  21.7* 22.5*  HGB 9.5* 9.5* 9.5* 8.2* 8.0*  HCT 28.0* 28.0* 28.0* 29.1* 28.3*  MCV  --   --   --  68.1* 68.2*  PLT  --   --   --  233 246   CMP: Recent Labs  Lab  01/19/24 1823 01/19/24 1824 01/19/24 1825 01/19/24 2033 01/20/24 0530  NA 137 137 137 137 137  K 4.1 4.0 4.0 3.8 4.1  CL  --   --   --  103 105  CO2  --   --   --  23 23  GLUCOSE  --   --   --  221* 226*  BUN  --   --   --  13 13  CREATININE  --   --   --  0.97 0.91  CALCIUM   --   --   --  8.6* 8.8*  MG  --   --   --  2.0  --    GFR: Estimated Creatinine Clearance: 88.8 mL/min (by C-G formula based on SCr of 0.91 mg/dL). Recent Labs  Lab 01/19/24 2033  AST 71*  ALT 24  ALKPHOS 65  BILITOT 0.7  PROT 6.6  ALBUMIN 3.0*  No results for input(s): LIPASE, AMYLASE in the last 168 hours. No results for input(s): AMMONIA in the last 168 hours. Coagulation Profile: No results for input(s): INR, PROTIME in the last 168 hours. Unresulted Labs (From admission, onward)     Start     Ordered   01/21/24 0500  Heparin  level (unfractionated)  Daily,   R      01/19/24 2232   01/21/24 0500  CBC  Daily,   R      01/19/24 2232   01/20/24 1500  Heparin  level (unfractionated)  Once-Timed,   TIMED       Question:  Specimen collection method  Answer:  Unit=Unit collect   01/20/24 0812   01/20/24 0500  Basic metabolic panel with GFR  Daily,   R      01/19/24 1904           Antimicrobials/Microbiology: Anti-infectives (From admission, onward)    Start     Dose/Rate Route Frequency Ordered Stop   01/20/24 1000  cefTRIAXone  (ROCEPHIN ) 1 g in sodium chloride  0.9 % 100 mL IVPB        1 g 200 mL/hr over 30 Minutes Intravenous Every 24 hours 01/19/24 1514 01/24/24 0959   01/19/24 1700  azithromycin  (ZITHROMAX ) tablet 500 mg        500 mg Oral Daily 01/19/24 1514 01/22/24 0959         Component Value Date/Time   SDES BLOOD RIGHT ARM 06/23/2019 1911   SPECREQUEST  06/23/2019 1911    BOTTLES DRAWN AEROBIC ONLY Blood Culture results may not be optimal due to an inadequate volume of blood received in culture bottles Performed at [facility], [address] [place],  [place] [address]    CULT NO GROWTH 7 DAYS 06/23/2019 1911   REPTSTATUS 06/30/2019 FINAL 06/23/2019 1911    Procedures: Procedure(s) (LRB): LEFT HEART CATH AND CORONARY ANGIOGRAPHY (N/A) RIGHT HEART CATH (N/A)   Mennie LAMY, MD [facility] 01/20/2024, 11:28 AM

## 2024-01-21 DIAGNOSIS — I214 Non-ST elevation (NSTEMI) myocardial infarction: Secondary | ICD-10-CM | POA: Diagnosis not present

## 2024-01-21 DIAGNOSIS — R570 Cardiogenic shock: Secondary | ICD-10-CM | POA: Diagnosis not present

## 2024-01-21 DIAGNOSIS — I2510 Atherosclerotic heart disease of native coronary artery without angina pectoris: Secondary | ICD-10-CM | POA: Diagnosis not present

## 2024-01-21 LAB — CBC
HCT: 27.2 % — ABNORMAL LOW (ref 36.0–46.0)
Hemoglobin: 7.7 g/dL — ABNORMAL LOW (ref 12.0–15.0)
MCH: 19.2 pg — ABNORMAL LOW (ref 26.0–34.0)
MCHC: 28.3 g/dL — ABNORMAL LOW (ref 30.0–36.0)
MCV: 67.8 fL — ABNORMAL LOW (ref 80.0–100.0)
Platelets: 223 K/uL (ref 150–400)
RBC: 4.01 MIL/uL (ref 3.87–5.11)
RDW: 19.4 % — ABNORMAL HIGH (ref 11.5–15.5)
WBC: 14.9 K/uL — ABNORMAL HIGH (ref 4.0–10.5)
nRBC: 0 % (ref 0.0–0.2)

## 2024-01-21 LAB — CK: Total CK: 70 U/L (ref 38–234)

## 2024-01-21 LAB — HEPARIN LEVEL (UNFRACTIONATED)
Heparin Unfractionated: 0.14 [IU]/mL — ABNORMAL LOW (ref 0.30–0.70)
Heparin Unfractionated: 0.15 [IU]/mL — ABNORMAL LOW (ref 0.30–0.70)

## 2024-01-21 LAB — BASIC METABOLIC PANEL WITH GFR
Anion gap: 10 (ref 5–15)
BUN: 22 mg/dL — ABNORMAL HIGH (ref 6–20)
CO2: 25 mmol/L (ref 22–32)
Calcium: 8.7 mg/dL — ABNORMAL LOW (ref 8.9–10.3)
Chloride: 104 mmol/L (ref 98–111)
Creatinine, Ser: 1.14 mg/dL — ABNORMAL HIGH (ref 0.44–1.00)
GFR, Estimated: >60 mL/min (ref >60)
Glucose, Bld: 162 mg/dL — ABNORMAL HIGH (ref 70–99)
Potassium: 3.5 mmol/L (ref 3.5–5.1)
Sodium: 139 mmol/L (ref 135–145)

## 2024-01-21 LAB — GLUCOSE, CAPILLARY
Glucose-Capillary: 170 mg/dL — ABNORMAL HIGH (ref 70–99)
Glucose-Capillary: 113 mg/dL — ABNORMAL HIGH (ref 70–99)
Glucose-Capillary: 167 mg/dL — ABNORMAL HIGH (ref 70–99)
Glucose-Capillary: 155 mg/dL — ABNORMAL HIGH (ref 70–99)

## 2024-01-21 MED ORDER — HYDROCODONE-ACETAMINOPHEN 5-325 MG PO TABS: 1.0000 | ORAL_TABLET | ORAL | Status: DC | PRN

## 2024-01-21 MED ORDER — HYDROCODONE-ACETAMINOPHEN 5-325 MG PO TABS
1.0000 | ORAL_TABLET | Freq: Four times a day (QID) | ORAL | Status: DC | PRN
  Administered 2024-01-21 – 2024-01-22 (×8): 2 via ORAL
  Administered 2024-01-23: 1 via ORAL
  Administered 2024-01-23 (×4): 2 via ORAL
  Administered 2024-01-23: 1 via ORAL
  Filled 2024-01-21 (×8): qty 2

## 2024-01-21 MED FILL — Norepinephrine-Dextrose IV Solution 4 MG/250ML-5%: INTRAVENOUS | Qty: 750 | Status: AC

## 2024-01-21 NOTE — Evaluation (Signed)
 Occupational Therapy Evaluation Patient Details Name: LAURAMAE KNEISLEY MRN: [id number] DOB: Mar 23, 1981 Today's Date: 01/21/2024   History of Present Illness   Pt is a 43 y.o. female admitted 8/8 for fall at home. In ED had elevated troponin, CTA of chest showed nondisplaced left 5th through 9th rib fractures,  left lung predominant peribronchovascular groundglass opacities and patchy consolidations, may represent pneumonia, pulmonary hemorrhage, and/or aspiration. S/p cardiac cath 8/8 with subacute occlusion distal RCA. 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 PTA pt was independent with ADLs .Today pt is requiring set up  to CGA for ADLs. Bed mobility was mod I and functional transfers are  CGA with use of RW. Pt maintaining soft BP throughout session denying any symptoms of lightheadedness. Lowest BP reading sitting EOB 79/69 (73). Pain and decreased activity tolerance limiting pt. Anticipate pt will progress well, no follow up OT needs.OT will continue to follow acutely to maximize functional independence.     If plan is discharge home, recommend the following:   A little help with walking and/or transfers;A little help with bathing/dressing/bathroom;Assistance with cooking/housework;Direct supervision/assist for medications management     Functional Status Assessment   Patient has had a recent decline in their functional status and demonstrates the ability to make significant improvements in function in a reasonable and predictable amount of time.     Equipment Recommendations   None recommended by OT      Precautions/Restrictions   Precautions Precautions: Fall Recall of  Precautions/Restrictions: Intact Restrictions Weight Bearing Restrictions Per Provider Order: No     Mobility Bed Mobility Overal bed mobility: Modified Independent     General bed mobility comments: No assist, slow d/t pain    Transfers Overall transfer level: Needs assistance Equipment used: Rolling walker (2 wheels) Transfers: Sit to/from Stand, Bed to chair/wheelchair/BSC Sit to Stand: Contact guard assist     Step pivot transfers: Contact guard assist     General transfer comment: CGA for short step pivot to recliner      Balance Overall balance assessment: Needs assistance Sitting-balance support: No upper extremity supported, Feet supported Sitting balance-Leahy Scale: Fair     Standing balance support: Bilateral upper extremity supported, During functional activity, Reliant on assistive device for balance Standing balance-Leahy Scale: Poor Standing balance comment: Reliant on RW       ADL either performed or assessed with clinical judgement   ADL Overall ADL's : Needs assistance/impaired Eating/Feeding: Set up;Sitting   Grooming: Set up;Sitting     Upper Body Dressing : Set up;Sitting   Lower Body Dressing: Contact guard assist;Sit to/from stand Lower Body Dressing Details (indicate cue type and reason): Able to don socks bed level, CGA to stand Toilet Transfer: Contact guard assist;Ambulation;Rolling walker (2 wheels) Toilet Transfer Details (indicate cue type and reason): CGA with RW, short distance Toileting- Clothing Manipulation and Hygiene: Contact guard assist;Sit to/from stand       Functional mobility during ADLs: Contact guard assist;Rolling walker (2 wheels) General ADL Comments: Pain limiting pt     Vision Baseline Vision/History: 0 No visual deficits Patient Visual Report: No change from baseline Vision Assessment?: No apparent visual deficits            Pertinent Vitals/Pain  Pain Assessment Pain Assessment: Faces Faces Pain  Scale: Hurts even more Pain Location: L sided rib pain Pain Descriptors / Indicators: Discomfort Pain Intervention(s): Repositioned     Extremity/Trunk Assessment Upper Extremity Assessment Upper Extremity Assessment: Generalized weakness   Lower Extremity Assessment Lower Extremity Assessment: Defer to PT evaluation   Cervical / Trunk Assessment Cervical / Trunk Assessment: Normal   Communication Communication Communication: No apparent difficulties Factors Affecting Communication: Reduced clarity of speech   Cognition Arousal: Alert Behavior During Therapy: WFL for tasks assessed/performed Cognition: History of cognitive impairments       OT - Cognition Comments: Pt A &O x4 able to recall events leading up to admission. Overall WFL. PMH of memory and cog deficits d/t CVAs       Following commands: Intact       Cueing  General Comments   Cueing Techniques: Verbal cues  Pt negative for orthostatics but maintaining soft BP throughout session, lowest reading EOB 79/69 (73)           Home Living Family/patient expects to be discharged to:: Private residence Living Arrangements: Parent Available Help at Discharge: Family;Available 24 hours/day Type of Home: House Home Access: Stairs to enter;Ramped entrance Entrance Stairs-Number of Steps: 2 Entrance Stairs-Rails: None Home Layout: One level     Bathroom Shower/Tub: Chief Strategy Officer: Standard     Home Equipment: Tub bench          Prior Functioning/Environment Prior Level of Function : Independent/Modified Independent       Mobility Comments: No AD, no falls other than one for admission ADLs Comments: Ind, family completes IADLs    OT Problem List: Decreased strength;Decreased range of motion;Decreased activity tolerance;Impaired balance (sitting and/or standing);Cardiopulmonary status limiting activity   OT Treatment/Interventions: Self-care/ADL training;Therapeutic  exercise;Energy conservation;DME and/or AE instruction;Therapeutic activities;Patient/family education;Balance training      OT Goals(Current goals can be found in the care plan section)   Acute Rehab OT Goals Patient Stated Goal: To get out of bed OT Goal Formulation: With patient Time For Goal Achievement: 02/04/24 Potential to Achieve Goals: Good   OT Frequency:  Min 2X/week       AM-PAC OT 6 Clicks Daily Activity     Outcome Measure Help from another person eating meals?: None Help from another person taking care of personal grooming?: A Little Help from another person toileting, which includes using toliet, bedpan, or urinal?: A Little Help from another person bathing (including washing, rinsing, drying)?: A Little Help from another person to put on and taking off regular upper body clothing?: A Little Help from another person to put on and taking off regular lower body clothing?: A Little 6 Click Score: 19   End of Session Equipment Utilized During Treatment: Rolling walker (2 wheels) Nurse Communication: Mobility status  Activity Tolerance: Patient tolerated treatment well Patient left: in chair;with call bell/phone within reach;with chair alarm set  OT Visit Diagnosis: Unsteadiness on feet (R26.81)                Time: 8796-8768 OT Time Calculation (min): 28 min Charges:  OT General Charges $OT Visit: 1 Visit OT Evaluation $OT Eval Moderate Complexity: 1 Mod  Chyrel Taha C, OT  Acute Rehabilitation Services Office [phone] Secure chat preferred   Adrianne GORMAN Savers 01/21/2024, 1:02 PM

## 2024-01-21 NOTE — Progress Notes (Signed)
 PROGRESS NOTE Taylor [name]  FMW:[id number] DOB: 1981/04/20 DOA: 01/19/2024 PCP: [name], NP  Brief Narrative/Hospital Course: f BPD, multivessel CAD, prior CVA, ischemic cardiomyopathy, seizure disorder, chronic pain, HTN, PAD, and DM2 who is a poor historian and has some noticeable cognitive decline, who had ground-level fall at home and brought to the ED,Pt was unable to get herself up, and was helped up by her father, with whom she resides, within minutes (pt was down for an unknown amount of time per signout bedside RN, Jolaine, received).  In the OSH ED significantly elevated troponin  troponin 200s-->8000s-->18000s. CTA chest showed nondisplaced left 5th through 9th rib fractures,  left lung predominant peribronchovascular groundglass opacities and patchy consolidations, may represent pneumonia, pulmonary hemorrhage, and/or aspiration She was started on IV abx. EDP started hep gtt for ACS and consulted cardiology at [facility] and transferred for  NSTEMI Recent admission 10/2023 needing LHC with multivessel disease, but CABG deferred as patient deemed poor surgical candidate and had PCI with DES to mid/distal L CX. Patient transferred out of ICU 8/9  Subjective: Seen and examined Patient reports continued pain at the rib fracture site and asking for more pain medication Overnight afebrile BP fairly stable mostly in high 90s.  Labs with creatinine 1.1 WBC nicely improved to 14.9 hemoglobin 7.7 from 8.0 microcytic> remains on heparin  drip  Assessment and plan:  NSTEMI Multivessel CAD-recent PCI in May, poor candidate for CABG Ischemic cardiomyopathy Cardiogenic shock due to NSTEMI-needing vasopressors during procedure-subsequently discontinued. Chronic systolic CHF previous EF 35-40% in May/9/25: S/P CARDIAC CATH urgently 8/8, with subacute occlusion distal RCA-advised medical therapy with heparin  drip dual antiplatelet Needed vasopressors during procedure and has been discontinued.   Vitals stable.  Received Lasix  IV 8/9. On heparin  drip with plan to continue x 48 hours per cardio from 8/9-defer to cardiology when to stop Cont Aspirin , Brilinta , Lipitor  80, Corlanor  ( STOPPEDd), Cont Toprol , Ranexa  Continue multimodal pain management TTE-shows EF around 35% indeterminate diastolic parameters RV SF normal MV normal AV indeterminate number of cusp interatrial septum not well-visualized.>continue Aldactone . Entresto  on hold- defer to cardiology/CHF team  CAP: Significant leukocytosis-but is improving.  Oxygenating oxygen. Continue ceftriaxone  Netromycin pulmonary toileting  Seizure disorder: Continue Keppra   Chronic anemia: hemoglobin at baseline around 8 to 9 g: Noted some drop, monitor and transfuse per protocol monitor.  Anemia panel reviewed from May slightly low iron  studies otherwise unremarkable-add iron  supplement  Fall at home Physical deconditioning Polypharmacy Chronic pain Nondisplaced left 5th through 9th rib fracture: Continue multimodal pain management-does have allergy to morphine  oxycodone  but able to take Norco Continue multimodal pain management lidocaine  patch, hydrocodone  PT OT IS Continue Flexeril , Valium  twice daily, fluoxetine -minimize sedatives baclofen , Lyrica  for now  Bipolar disorder Cognitive decline Chronic pain on narcotic: Continue Risperdal  at bedtime trazodone , supportive care delirium precaution fall precaution Encouragement on mobility with PT OT  Obesity There is no height or weight on file to calculate BMI.: Will benefit with PCP follow-up, weight loss,healthy lifestyle and outpatient sleep eval if not done.  Mobility: PT Orders:  PT Follow up Rec:    DVT prophylaxis: HEPARIN  GTT Code Status:   Code Status: Full Code Family Communication: plan of care discussed with patient at bedside. Patient status is: Remains hospitalized because of severity of illness Level of care: Telemetry Cardiac    Dispo: The patient is from:  Home lives with parents            Anticipated disposition: TBD.   Objective: Vitals  last 24 hrs: Vitals:   01/20/24 2315 01/21/24 0403 01/21/24 0714 01/21/24 0856  BP: (!) 99/56 99/69 (!) 94/56 105/62  Pulse: 81 81 85 84  Resp: 18 16 16    Temp: 98.2 F (36.8 C) 97.6 F (36.4 C) 97.7 F (36.5 C)   TempSrc: Axillary Oral Oral   SpO2: 96% 96% 95%     Physical Examination: General exam: Alert awake and obese  HEENT:Oral mucosa moist, Ear/Nose WNL grossly Respiratory system: Bilaterally clear BS,no use of accessory muscle Cardiovascular system: S1 & S2 +, No JVD. Gastrointestinal system: Abdomen soft, OBESE, NT,ND, BS+ Nervous System: Alert, awake, moving all extremities,and following commands. Extremities: LE edema neg, distal extremities warm.  Skin: No rashes,no icterus. MSK: Normal muscle bulk,tone, power   Medications reviewed:  Scheduled Meds:  aspirin  EC  81 mg Oral Daily   atorvastatin   80 mg Oral Daily   Chlorhexidine  Gluconate Cloth  6 each Topical Daily   cyclobenzaprine   10 mg Oral TID   dapagliflozin  propanediol  10 mg Oral Daily   diazepam   5 mg Oral BID   FLUoxetine   20 mg Oral q AM   free water   250 mL Oral Once   hydrOXYzine   25 mg Oral TID   insulin  aspart  0-15 Units Subcutaneous TID WC   insulin  aspart  0-5 Units Subcutaneous QHS   levETIRAcetam   1,000 mg Oral Daily   lidocaine   1 patch Transdermal Daily   metoprolol  succinate  12.5 mg Oral Daily   pantoprazole   40 mg Oral Daily   ranolazine   1,000 mg Oral BID   risperiDONE   0.5 mg Oral QHS   sodium chloride  flush  3 mL Intravenous Q12H   spironolactone   25 mg Oral Daily   ticagrelor   90 mg Oral BID   traZODone   200 mg Oral QHS   Continuous Infusions:  sodium chloride  10 mL/hr at 01/20/24 1600   cefTRIAXone  (ROCEPHIN )  IV 1 g (01/21/24 0924)   heparin  1,350 Units/hr (01/21/24 9364)   Diet: Diet Order             Diet Heart Room service appropriate? Yes; Fluid consistency: Thin  Diet  effective now                   Data Reviewed: I have personally reviewed following labs and imaging studies ( see epic result tab) CBC: Recent Labs  Lab 01/19/24 1824 01/19/24 1825 01/19/24 2041 01/20/24 0530 01/21/24 0438  WBC  --   --  21.7* 22.5* 14.9*  HGB 9.5* 9.5* 8.2* 8.0* 7.7*  HCT 28.0* 28.0* 29.1* 28.3* 27.2*  MCV  --   --  68.1* 68.2* 67.8*  PLT  --   --  233 246 223   CMP: Recent Labs  Lab 01/19/24 1824 01/19/24 1825 01/19/24 2033 01/20/24 0530 01/21/24 0438  NA 137 137 137 137 139  K 4.0 4.0 3.8 4.1 3.5  CL  --   --  103 105 104  CO2  --   --  23 23 25   GLUCOSE  --   --  221* 226* 162*  BUN  --   --  13 13 22*  CREATININE  --   --  0.97 0.91 1.14*  CALCIUM   --   --  8.6* 8.8* 8.7*  MG  --   --  2.0  --   --    GFR: Estimated Creatinine Clearance: 70.9 mL/min (A) (by C-G formula based on SCr of 1.14  mg/dL (H)). Recent Labs  Lab 01/19/24 2033  AST 71*  ALT 24  ALKPHOS 65  BILITOT 0.7  PROT 6.6  ALBUMIN 3.0*   No results for input(s): LIPASE, AMYLASE in the last 168 hours. No results for input(s): AMMONIA in the last 168 hours. Coagulation Profile: No results for input(s): INR, PROTIME in the last 168 hours. Unresulted Labs (From admission, onward)     Start     Ordered   01/22/24 0500  Heparin  level (unfractionated)  Daily,   R     Question:  Specimen collection method  Answer:  Unit=Unit collect   01/21/24 0658   01/21/24 1300  Heparin  level (unfractionated)  Once-Timed,   TIMED       Question:  Specimen collection method  Answer:  Unit=Unit collect   01/21/24 0658   01/21/24 0500  CBC  Daily,   R      01/19/24 2232   01/20/24 0500  Basic metabolic panel with GFR  Daily,   R      01/19/24 1904           Antimicrobials/Microbiology: Anti-infectives (From admission, onward)    Start     Dose/Rate Route Frequency Ordered Stop   01/20/24 1000  cefTRIAXone  (ROCEPHIN ) 1 g in sodium chloride  0.9 % 100 mL IVPB        1 g 200  mL/hr over 30 Minutes Intravenous Every 24 hours 01/19/24 1514 01/24/24 0959   01/19/24 1700  azithromycin  (ZITHROMAX ) tablet 500 mg        500 mg Oral Daily 01/19/24 1514 01/21/24 0856         Component Value Date/Time   SDES BLOOD RIGHT ARM 06/23/2019 1911   SPECREQUEST  06/23/2019 1911    BOTTLES DRAWN AEROBIC ONLY Blood Culture results may not be optimal due to an inadequate volume of blood received in culture bottles Performed at [facility], [address], [place], [place] [address]    CULT NO GROWTH 7 DAYS 06/23/2019 1911   REPTSTATUS 06/30/2019 FINAL 06/23/2019 1911    Procedures: Procedure(s) (LRB): LEFT HEART CATH AND CORONARY ANGIOGRAPHY (N/A) RIGHT HEART CATH (N/A)   Mennie LAMY, MD [facility] 01/21/2024, 10:47 AM

## 2024-01-21 NOTE — Progress Notes (Signed)
 PHARMACY - ANTICOAGULATION  Pharmacy Consult for heparin  Indication: chest pain/ACS Brief A/P: Heparin  level subtherapeutic Increase Heparin  rate  Allergies  Allergen Reactions   Amitriptyline Other (See Comments)    Severe dissociative state   Contrast Media [Iodinated Contrast Media] Other (See Comments)    2013-had CVA, went into AFR-combination ABT, contrast, low BP   Morphine  And Codeine Other (See Comments)    Chest pain   Promethazine  Swelling    Lips swell   Compazine Other (See Comments)    -Alert but unable to move   Prochlorperazine Maleate Other (See Comments)    -Alert, but unable to move   Adhesive [Tape] Rash    Long term exposure causes skin to tear    Oxycodone  Nausea And Vomiting    Severe n/v -  patient will not take oxycodone  or percocet.   Sumatriptan Rash    Patient Measurements: Dosing body weight 76kg  Labs: Recent Labs    01/19/24 2033 01/19/24 2041 01/20/24 0530 01/20/24 1513 01/21/24 0438  HGB  --  8.2* 8.0*  --  7.7*  HCT  --  29.1* 28.3*  --  27.2*  PLT  --  233 246  --  223  HEPARINUNFRC  --   --  0.16* 0.25* 0.14*  CREATININE 0.97  --  0.91  --  1.14*  TROPONINIHS 7,390*  --   --   --   --     Estimated Creatinine Clearance: 70.9 mL/min (A) (by C-G formula based on SCr of 1.14 mg/dL (H)).   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   due to elevated blood sugar 1700-off insulin  6 months   Depression    Diabetes mellitus    off insulin  6 months   Diverticulitis    Dyspnea    when walking   GERD (gastroesophageal reflux disease)    Hypertension    Kidney dialysis status    on dialysis(stroke) off after 4-6 weeks   Pneumonia 2013, 06/2018   Respiratory failure (HCC) 04/2012   with stroke - in hosp [facility] 12 weeks   Seizures (HCC)    during elvated blood sugsr episode  11/13   Sepsis (HCC)    Stroke (HCC)    11/2018, 12/2018 Weak left side, speech- slurred, Short term memeroy, Gait unsteady    Vocal cord dysfunction    Assessment: 43 yo female with NSTEMI s/p cath for heparin    Goal of Therapy:  Heparin  level 0.3-0.7 units/ml Monitor platelets by anticoagulation protocol: Yes   Plan Increase Heparin  1350 units/hr  Cathlyn Arrant, PharmD, BCPS  01/21/2024 6:04 AM

## 2024-01-21 NOTE — Progress Notes (Addendum)
 Progress Note  Patient Name: Taylor [name] Date of Encounter: 01/21/2024  Primary Cardiologist: Lakshya Mcgillicuddy P Chung Chagoya, MD  Subjective   Complains of pain in her chest and abdomen after a fall.  Inpatient Medications    Scheduled Meds:  aspirin  EC  81 mg Oral Daily   atorvastatin   80 mg Oral Daily   Chlorhexidine  Gluconate Cloth  6 each Topical Daily   cyclobenzaprine   10 mg Oral TID   dapagliflozin  propanediol  10 mg Oral Daily   diazepam   5 mg Oral BID   FLUoxetine   20 mg Oral q AM   free water   250 mL Oral Once   hydrOXYzine   25 mg Oral TID   insulin  aspart  0-15 Units Subcutaneous TID WC   insulin  aspart  0-5 Units Subcutaneous QHS   levETIRAcetam   1,000 mg Oral Daily   lidocaine   1 patch Transdermal Daily   metoprolol  succinate  12.5 mg Oral Daily   pantoprazole   40 mg Oral Daily   ranolazine   1,000 mg Oral BID   risperiDONE   0.5 mg Oral QHS   sodium chloride  flush  3 mL Intravenous Q12H   spironolactone   25 mg Oral Daily   ticagrelor   90 mg Oral BID   traZODone   200 mg Oral QHS   Continuous Infusions:  sodium chloride  10 mL/hr at 01/20/24 1600   cefTRIAXone  (ROCEPHIN )  IV 1 g (01/21/24 0924)   heparin  1,350 Units/hr (01/21/24 0635)   PRN Meds: acetaminophen , HYDROcodone -acetaminophen , nitroGLYCERIN , ondansetron  (ZOFRAN ) IV, sodium chloride  flush, traMADol    Vital Signs    Vitals:   01/21/24 0403 01/21/24 0714 01/21/24 0856 01/21/24 1138  BP: 99/69 (!) 94/56 105/62 100/64  Pulse: 81 85 84 84  Resp: 16 16  16   Temp: 97.6 F (36.4 C) 97.7 F (36.5 C)  97.9 F (36.6 C)  TempSrc: Oral Oral  Oral  SpO2: 96% 95%  94%    Intake/Output Summary (Last 24 hours) at 01/21/2024 1204 Last data filed at 01/21/2024 1035 Gross per 24 hour  Intake 833.46 ml  Output 250 ml  Net 583.46 ml   There were no vitals filed for this visit.  Telemetry     Personally reviewed.  NSR.  ECG    Not performed today.  Physical Exam   GEN: No acute distress.   Neck:  Unable to examine JVD due to body habitus Cardiac: RRR, no murmur, rub, or gallop.  Respiratory: Nonlabored. Clear to auscultation bilaterally. GI: Soft, nontender, bowel sounds present. MS: No edema; No deformity. Neuro:  Nonfocal. Psych: Alert and oriented x 3. Normal affect.  Labs    Chemistry Recent Labs  Lab 01/19/24 2033 01/20/24 0530 01/21/24 0438  NA 137 137 139  K 3.8 4.1 3.5  CL 103 105 104  CO2 23 23 25   GLUCOSE 221* 226* 162*  BUN 13 13 22*  CREATININE 0.97 0.91 1.14*  CALCIUM  8.6* 8.8* 8.7*  PROT 6.6  --   --   ALBUMIN 3.0*  --   --   AST 71*  --   --   ALT 24  --   --   ALKPHOS 65  --   --   BILITOT 0.7  --   --   GFRNONAA >60 >60 >60  ANIONGAP 11 9 10      Hematology Recent Labs  Lab 01/19/24 2041 01/20/24 0530 01/21/24 0438  WBC 21.7* 22.5* 14.9*  RBC 4.27 4.15 4.01  HGB 8.2* 8.0* 7.7*  HCT 29.1*  28.3* 27.2*  MCV 68.1* 68.2* 67.8*  MCH 19.2* 19.3* 19.2*  MCHC 28.2* 28.3* 28.3*  RDW 19.4* 19.4* 19.4*  PLT 233 246 223    Cardiac Enzymes Recent Labs  Lab 01/19/24 2033  TROPONINIHS 7,390*    BNP Recent Labs  Lab 01/19/24 2041  BNP 385.9*     DDimerNo results for input(s): DDIMER in the last 168 hours.   Radiology    ECHOCARDIOGRAM COMPLETE Result Date: 01/20/2024    ECHOCARDIOGRAM REPORT   Patient Name:   Taylor [name] Date of Exam: 01/20/2024 Medical Rec #:  [id number]     Height:       64.0 in Accession #:    [id number]    Weight:       212.0 lb Date of Birth:  12-25-1980      BSA:          2.005 m Patient Age:    43 years      BP:           120/75 mmHg Patient Gender: F             HR:           89 bpm. Exam Location:  Inpatient Procedure: 2D Echo, 3D Echo, Cardiac Doppler and Color Doppler (Both Spectral            and Color Flow Doppler were utilized during procedure). Indications:    Chest Pain R07.9  History:        Patient has prior history of Echocardiogram examinations, most                 recent 10/20/2023. NSTEMI and CAD, hx of  Stroke,                 Signs/Symptoms:Chest Pain; Risk Factors:Diabetes.  Sonographer:    Koleen Popper RDCS Referring Phys: [id number] ARTIST POUCH  Sonographer Comments: Patient is obese. IMPRESSIONS  1. Left ventricular ejection fraction, by estimation, is 35%. The left ventricle has moderately decreased function. The left ventricle demonstrates global hypokinesis. Left ventricular diastolic parameters are indeterminate.  2. Right ventricular systolic function is normal. The right ventricular size is normal.  3. The mitral valve is normal in structure. Trivial mitral valve regurgitation. No evidence of mitral stenosis.  4. The aortic valve has an indeterminant number of cusps. Aortic valve regurgitation is not visualized. No aortic stenosis is present.  5. The inferior vena cava is normal in size with <50% respiratory variability, suggesting right atrial pressure of 8 mmHg. FINDINGS  Left Ventricle: Left ventricular ejection fraction, by estimation, is 35%. The left ventricle has moderately decreased function. The left ventricle demonstrates global hypokinesis. The left ventricular internal cavity size was normal in size. There is no left ventricular hypertrophy. Left ventricular diastolic parameters are indeterminate. Right Ventricle: The right ventricular size is normal. Right vetricular wall thickness was not well visualized. Right ventricular systolic function is normal. Left Atrium: Left atrial size was normal in size. Right Atrium: Right atrial size was normal in size. Pericardium: There is no evidence of pericardial effusion. Mitral Valve: The mitral valve is normal in structure. Trivial mitral valve regurgitation. No evidence of mitral valve stenosis. Tricuspid Valve: The tricuspid valve is normal in structure. Tricuspid valve regurgitation is not demonstrated. No evidence of tricuspid stenosis. Aortic Valve: The aortic valve has an indeterminant number of cusps. Aortic valve regurgitation is not  visualized. No aortic stenosis is present. Aortic valve mean gradient measures 4.8 mmHg.  Aortic valve peak gradient measures 10.3 mmHg. Aortic valve area, by VTI measures 1.53 cm. Pulmonic Valve: The pulmonic valve was not well visualized. Pulmonic valve regurgitation is not visualized. No evidence of pulmonic stenosis. Aorta: The aortic root and ascending aorta are structurally normal, with no evidence of dilitation. Venous: The inferior vena cava is normal in size with less than 50% respiratory variability, suggesting right atrial pressure of 8 mmHg. IAS/Shunts: The interatrial septum was not well visualized. Additional Comments: 3D was performed not requiring image post processing on an independent workstation and was abnormal.  LEFT VENTRICLE PLAX 2D LVIDd:         4.90 cm   Diastology LVIDs:         4.17 cm   LV e' medial:    7.29 cm/s LV PW:         1.00 cm   LV E/e' medial:  15.0 LV IVS:        0.91 cm   LV e' lateral:   12.10 cm/s LVOT diam:     1.79 cm   LV E/e' lateral: 9.0 LV SV:         46 LV SV Index:   23 LVOT Area:     2.52 cm                           3D Volume EF:                          3D EF:        31 %                          LV EDV:       163 ml                          LV ESV:       113 ml                          LV SV:        51 ml RIGHT VENTRICLE             IVC RV S prime:     19.20 cm/s  IVC diam: 2.07 cm TAPSE (M-mode): 2.0 cm LEFT ATRIUM           Index        RIGHT ATRIUM          Index LA diam:      4.55 cm 2.27 cm/m   RA Area:     9.24 cm LA Vol (A2C): 36.1 ml 18.00 ml/m  RA Volume:   16.50 ml 8.23 ml/m LA Vol (A4C): 46.8 ml 23.34 ml/m  AORTIC VALVE AV Area (Vmax):    1.47 cm AV Area (Vmean):   1.55 cm AV Area (VTI):     1.53 cm AV Vmax:           160.42 cm/s AV Vmean:          103.086 cm/s AV VTI:            0.302 m AV Peak Grad:      10.3 mmHg AV Mean Grad:      4.8 mmHg LVOT Vmax:         93.80 cm/s LVOT Vmean:  63.400 cm/s LVOT VTI:          0.184 m LVOT/AV VTI  ratio: 0.61  AORTA Ao Root diam: 2.53 cm Ao Asc diam:  2.84 cm MITRAL VALVE MV Area (PHT): 5.13 cm     SHUNTS MV Decel Time: 148 msec     Systemic VTI:  0.18 m MV E velocity: 109.00 cm/s  Systemic Diam: 1.79 cm MV A velocity: 108.00 cm/s MV E/A ratio:  1.01 Dorn Ross MD Electronically signed by Dorn Ross MD Signature Date/Time: 01/20/2024/1:05:26 PM    Final    Port CXR Result Date: 01/19/2024 CLINICAL DATA:  Central line placement EXAM: PORTABLE CHEST 1 VIEW COMPARISON:  Chest x-ray 07/19/2023 FINDINGS: Right-sided central venous catheter tip is at the level of the right main pulmonary artery. The heart is enlarged, unchanged the lungs are clear. There is no pleural effusion or pneumothorax. No acute fractures are seen. IMPRESSION: Right-sided central venous catheter tip is at the level of the right main pulmonary artery. Electronically Signed   By: Greig Pique M.D.   On: 01/19/2024 19:40   CARDIAC CATHETERIZATION Addendum Date: 01/19/2024   Dist LM lesion is 50% stenosed.   Prox LAD lesion is 70% stenosed.   Mid LAD lesion is 40% stenosed.   Ost Cx to Prox Cx lesion is 70% stenosed.   Dist Cx lesion is 50% stenosed.   Dist RCA lesion is 100% stenosed.   Prox RCA lesion is 65% stenosed.   Mid RCA lesion is 99% stenosed.   1st Diag lesion is 70% stenosed.   Non-stenotic Mid Cx to Dist Cx lesion was previously treated. 1.  Subacute occlusion of distal right coronary artery that is now collateralized by the septal perforators.  Given the poor outflow of this vessel based on previous angiogram, no PCI was pursued. 2.  Patent mid left circumflex stent with moderate, diffuse disease of the LAD. 3.  LVEDP of 31 mmHg 4.  Given ongoing hypotension in the lab, a Swan-Ganz catheter was placed from the right IJ approach with the following hemodynamics:  Fick cardiac output of 6.25 L/min, Fick cardiac index of 3.1 L/min/m TD output of 5.5 L/min, TD index of 2.7 L/min/m  Right atrial pressure mean of 12 mmHg   RV of 48/8 with an end-diastolic pressure of 15 mmHg  Wedge pressure mean of 26 mmHg with V waves to 32 mmHg  PA pressure 56/21 9 with a mean of 41 mmHg PVR of 2.4 (Fick);  2.7 (TD) PA pulsatility index of 2.25 5.  Point-of-care lactate in cardiac catheterization laboratory of 0.6 with reassuring ABG. Recommendation: Given mean arterial pressures of greater than 70 mm, norepinephrine  was weaned off.  Medical therapy for occluded right coronary artery with resumption of heparin  drip, dual antiplatelet therapy, and pain control.  Given patient's morphine  and codeine allergy, scheduled Toradol  will be administered.  Results reviewed with Dr. Cherrie, the 2H attending, who will assume care.  Result Date: 01/19/2024   Dist LM lesion is 50% stenosed.   Prox LAD lesion is 70% stenosed.   Mid LAD lesion is 40% stenosed.   Ost Cx to Prox Cx lesion is 70% stenosed.   Dist Cx lesion is 50% stenosed.   Dist RCA lesion is 100% stenosed.   Prox RCA lesion is 65% stenosed.   Mid RCA lesion is 99% stenosed.   1st Diag lesion is 70% stenosed.   Non-stenotic Mid Cx to Dist Cx lesion was previously treated. 1.  Subacute  occlusion of distal right coronary artery that is now collateralized by the septal perforators.  Given the poor outflow of this vessel based on previous angiogram, no PCI was pursued. 2.  Patent mid left circumflex stent with moderate, diffuse disease of the LAD. 3.  LVEDP of 31 mmHg 4.  Given ongoing hypotension in the lab, a Swan-Ganz catheter was placed from the right IJ approach with the following hemodynamics:  Fick cardiac output of 6.25 L/min, Fick cardiac index of 3.1 L/min/m TD output of 5.5 L/min, TD index of 2.7 L/min/m  Right atrial pressure mean of 12 mmHg  RV of 48/8 with an end-diastolic pressure of 15 mmHg  Wedge pressure mean of 26 mmHg with V waves to 32 mmHg  PA pressure 56/21 9 with a mean of 41 mmHg PVR of 2.4 (Fick);  2.7 (TD) PA pulsatility index of 2.25 5.  Point-of-care lactate in cardiac  catheterization laboratory of 0.6 with reassuring ABG. Recommendation: Given mean arterial pressures of greater than 70 mm, norepinephrine  was weaned off.  Medical therapy for occluded right coronary artery with resumption of heparin  drip, dual antiplatelet therapy, and pain control.  Given patient's morphine  and codeine allergy, scheduled Toradol  will be administered.  Given elevated filling pressures, will administer Lasix  10 mg IV x 1 as patient is Lasix  nave.  Results reviewed with Dr. Cherrie, the 2H attending, who will assume care.    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 Does not recall if she passed out.  Will obtain CK to r/o rhabdomyolysis. - High-sensitivity troponins significantly elevated, Hs 297>>8701>>18,960. - LHC showed occluded RCA, subacute and given poor outflow, medical management was recommended. - Continue heparin  drip for a total duration of 48 hours. - Continue aspirin /Brilinta  for 1 year. - Continue Lipitor  80 mg nightly. - Asking for pain meds, will continue current frequency of Norco every 6 hours as needed.  Patient reported that she developed rib fractures due to a fall but chest x-ray did not reveal any rib fractures.  Transient cardiogenic shock, resolved - Transient hypotension after LHC requiring vasopressors.  Moderately elevated filling pressures on RHC that normalized the following day. - Home Entresto  and ivabradine  were discontinued. - Continue metoprolol  succinate 12.5 mg once daily. - Continue Farxiga  10 mg once daily. - Continue spironolactone  25 mg once daily.  Community-acquired ammonia - Chronic leukocytosis, improving. - Per primary team, currently on ceftriaxone .   Signed, Cay Kath P Pryce Folts, MD  01/21/2024, 12:04 PM

## 2024-01-21 NOTE — Progress Notes (Signed)
 PHARMACY - ANTICOAGULATION  Pharmacy Consult for heparin  Indication: chest pain/ACS  Allergies  Allergen Reactions   Amitriptyline Other (See Comments)    Severe dissociative state   Contrast Media [Iodinated Contrast Media] Other (See Comments)    2013-had CVA, went into AFR-combination ABT, contrast, low BP   Morphine  And Codeine Other (See Comments)    Chest pain   Promethazine  Swelling    Lips swell   Compazine Other (See Comments)    -Alert but unable to move   Prochlorperazine Maleate Other (See Comments)    -Alert, but unable to move   Adhesive [Tape] Rash    Long term exposure causes skin to tear    Oxycodone  Nausea And Vomiting    Severe n/v -  patient will not take oxycodone  or percocet.   Sumatriptan Rash   Patient Measurements: Dosing body weight 76kg  Labs: Recent Labs    01/19/24 2033 01/19/24 2041 01/19/24 2041 01/20/24 0530 01/20/24 1513 01/21/24 0438 01/21/24 1239  HGB  --  8.2*  --  8.0*  --  7.7*  --   HCT  --  29.1*  --  28.3*  --  27.2*  --   PLT  --  233  --  246  --  223  --   HEPARINUNFRC  --   --    < > 0.16* 0.25* 0.14* 0.15*  CREATININE 0.97  --   --  0.91  --  1.14*  --   CKTOTAL  --   --   --   --   --   --  70  TROPONINIHS 7,390*  --   --   --   --   --   --    < > = values in this interval not displayed.   Estimated Creatinine Clearance: 70.9 mL/min (A) (by C-G formula based on SCr of 1.14 mg/dL (H)).  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   due to elevated blood sugar 1700-off insulin  6 months   Depression    Diabetes mellitus    off insulin  6 months   Diverticulitis    Dyspnea    when walking   GERD (gastroesophageal reflux disease)    Hypertension    Kidney dialysis status    on dialysis(stroke) off after 4-6 weeks   Pneumonia 2013, 06/2018   Respiratory  failure (HCC) 04/2012   with stroke - in hosp [facility] 12 weeks   Seizures (HCC)    during elvated blood sugsr episode 11/13   Sepsis (HCC)    Stroke (HCC)    11/2018, 12/2018 Weak left side, speech- slurred, Short term memeroy, Gait unsteady    Vocal cord dysfunction    Assessment: 43 yo female with NSTEMI s/p cath on 8/8. Medical management of NSTEMI recommended. Pharmacy has been consulted to dose heparin  for ACS. Heparin  on for total duration of 48h (scheduled to stop 8/11 at 0029).  8/10 13:00 heparin  level was subtherapeutic at 0.15 on infusion of 1350 units/h. No s/sx bleeding or issues with heparin  infusion documented.   Goal of Therapy:  Heparin  level 0.3-0.7 units/ml Monitor platelets by anticoagulation protocol: Yes  Plan Increase heparin  to 1650 units/h.  Check heparin  level in 6h.  Removed daily heparin  level given infusion will only run until 8/11 at 0029. Continue to monitor for  s/sx bleeding.   Maurilio Patten, PharmD PGY1 Pharmacy Resident [facility] 01/21/2024 1:38 PM

## 2024-01-21 NOTE — Evaluation (Addendum)
 Physical Therapy Evaluation Patient Details Name: Taylor [name] MRN: [id number] DOB: 06-26-1980 Today's Date: 01/21/2024  History of Present Illness  Pt is a 43 y.o. female admitted 8/8 for fall at home. In ED had elevated troponin, CTA of chest showed nondisplaced L 5-9 rib fractures, L lung predominant peribronchovascular groundglass opacities and patchy consolidations, may represent PNA, pulmonary hemorrhage, and/or aspiration. Pt also with cardiogenic shock 2/2 NSTEMI. 8/8 s/p urgent cardiac cath with subacute occlusion distal RCA. 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 PTA, pt was independent for mobility with no AD. In today's session, pt required CGA to stand with no AD and with RW. Attempted to ambulate with no AD, however, pt unsteady and reaching for UE support with MinA for balance. Able to ambulate 47ft with RW and CGA. Pt has 24/7 level of assist available at home. Recommending post-acute HHPT with intended progression to OP PT to work towards independence with mobility and prevent future falls. Pt would benefit from acute skilled PT with current functional limitations listed below (see PT Problem List). Acute PT to follow.         If plan is discharge home, recommend the following: A lot of help with walking and/or transfers;A lot of help with bathing/dressing/bathroom;Assistance with cooking/housework;Direct supervision/assist for medications management;Direct supervision/assist for financial management;Assist for transportation;Help with stairs or ramp for entrance   Can travel by private vehicle    Yes    Equipment Recommendations None recommended by PT     Functional Status  Assessment Patient has had a recent decline in their functional status and demonstrates the ability to make significant improvements in function in a reasonable and predictable amount of time.     Precautions / Restrictions Precautions Precautions: Fall Recall of Precautions/Restrictions: Intact Precaution/Restrictions Comments: L rib fxs 5-9 Restrictions Weight Bearing Restrictions Per Provider Order: No      Mobility  Bed Mobility    General bed mobility comments: in recliner upon arrival    Transfers Overall transfer level: Needs assistance Equipment used: Rolling walker (2 wheels), None Transfers: Sit to/from Stand Sit to Stand: Contact guard assist    General transfer comment: CGA for safety with use of no AD and RW    Ambulation/Gait Ambulation/Gait assistance: Contact guard assist, Min assist Gait Distance (Feet): 30 Feet Assistive device: Rolling walker (2 wheels), None Gait Pattern/deviations: Step-through pattern, Decreased stride length Gait velocity: decr     General Gait Details: unsteady with no AD with pt reaching for support and stumbling left/right. Improved stability with RW with cues for RW management to navigate around obstacles    Balance Overall balance assessment: Needs assistance Sitting-balance support: No upper extremity supported, Feet supported Sitting balance-Leahy Scale: Fair     Standing balance support: Bilateral upper extremity supported, During functional activity, Reliant on assistive device for balance Standing balance-Leahy Scale: Poor Standing balance comment: Reliant on RW         Pertinent Vitals/Pain Pain Assessment Pain Assessment: Faces Faces Pain Scale: Hurts even more Pain Location: L sided rib pain Pain Descriptors / Indicators: Discomfort Pain Intervention(s): Limited activity within patient's tolerance, Monitored during session, Repositioned, Patient requesting pain meds-RN notified    Home Living  Family/patient expects to be discharged  to:: Private residence Living Arrangements: Parent Available Help at Discharge: Family;Available 24 hours/day Type of Home: House Home Access: Stairs to enter;Ramped entrance Entrance Stairs-Rails: None Entrance Stairs-Number of Steps: 2   Home Layout: One level Home Equipment: Tub bench;Rolling Walker (2 wheels);Rollator (4 wheels) (family has extra RW and rollators in the home)      Prior Function Prior Level of Function : Independent/Modified Independent    Mobility Comments: No AD, no falls other than two for admission ADLs Comments: Ind, family completes IADLs     Extremity/Trunk Assessment   Upper Extremity Assessment Upper Extremity Assessment: Defer to OT evaluation    Lower Extremity Assessment Lower Extremity Assessment: Generalized weakness    Cervical / Trunk Assessment Cervical / Trunk Assessment: Normal  Communication   Communication Communication: No apparent difficulties Factors Affecting Communication: Reduced clarity of speech    Cognition Arousal: Alert Behavior During Therapy: WFL for tasks assessed/performed, Agitated   PT - Cognitive impairments: Safety/Judgement    PT - Cognition Comments: Agitated about not receiving pain medicine and close guarding during ambulation, decreased safety awareness Following commands: Intact       Cueing Cueing Techniques: Verbal cues     General Comments General comments (skin integrity, edema, etc.): Pt negative for orthostatics but maintaining soft BP throughout session, lowest reading EOB 79/69 (73)     PT Assessment Patient needs continued PT services  PT Problem List Decreased strength;Decreased activity tolerance;Decreased balance;Decreased mobility;Decreased safety awareness       PT Treatment Interventions DME instruction;Gait training;Functional mobility training;Therapeutic activities;Therapeutic exercise;Stair training;Balance training;Neuromuscular  re-education;Patient/family education    PT Goals (Current goals can be found in the Care Plan section)  Acute Rehab PT Goals Patient Stated Goal: to get home PT Goal Formulation: With patient/family Time For Goal Achievement: 02/04/24 Potential to Achieve Goals: Good    Frequency Min 2X/week        AM-PAC PT 6 Clicks Mobility  Outcome Measure Help needed turning from your back to your side while in a flat bed without using bedrails?: A Little Help needed moving from lying on your back to sitting on the side of a flat bed without using bedrails?: A Little Help needed moving to and from a bed to a chair (including a wheelchair)?: A Little Help needed standing up from a chair using your arms (e.g., wheelchair or bedside chair)?: A Little Help needed to walk in hospital room?: A Little Help needed climbing 3-5 steps with a railing? : A Lot 6 Click Score: 17    End of Session   Activity Tolerance: Patient tolerated treatment well Patient left: in chair;with call bell/phone within reach;with chair alarm set;with family/visitor present Nurse Communication: Mobility status;Patient requests pain meds PT Visit Diagnosis: Unsteadiness on feet (R26.81);Other abnormalities of gait and mobility (R26.89);Muscle weakness (generalized) (M62.81);History of falling (Z91.81)    Time: 8647-8588 PT Time Calculation (min) (ACUTE ONLY): 19 min   Charges:   PT Evaluation $PT Eval Low Complexity: 1 Low   PT General Charges $$ ACUTE PT VISIT: 1 Visit       Kate ORN, PT, DPT Secure Chat Preferred  Rehab Office [phone]   Kate BRAVO Wendolyn 01/21/2024, 2:23 PM

## 2024-01-22 ENCOUNTER — Other Ambulatory Visit (HOSPITAL_COMMUNITY): Payer: Self-pay

## 2024-01-22 ENCOUNTER — Encounter (HOSPITAL_COMMUNITY)

## 2024-01-22 DIAGNOSIS — I214 Non-ST elevation (NSTEMI) myocardial infarction: Secondary | ICD-10-CM | POA: Diagnosis not present

## 2024-01-22 LAB — GLUCOSE, CAPILLARY
Glucose-Capillary: 125 mg/dL — ABNORMAL HIGH (ref 70–99)
Glucose-Capillary: 117 mg/dL — ABNORMAL HIGH (ref 70–99)
Glucose-Capillary: 127 mg/dL — ABNORMAL HIGH (ref 70–99)
Glucose-Capillary: 147 mg/dL — ABNORMAL HIGH (ref 70–99)

## 2024-01-22 LAB — BASIC METABOLIC PANEL WITH GFR
Anion gap: 10 (ref 5–15)
BUN: 15 mg/dL (ref 6–20)
CO2: 23 mmol/L (ref 22–32)
Calcium: 8.8 mg/dL — ABNORMAL LOW (ref 8.9–10.3)
Chloride: 106 mmol/L (ref 98–111)
Creatinine, Ser: 0.99 mg/dL (ref 0.44–1.00)
GFR, Estimated: >60 mL/min (ref >60)
Glucose, Bld: 119 mg/dL — ABNORMAL HIGH (ref 70–99)
Potassium: 3.7 mmol/L (ref 3.5–5.1)
Sodium: 139 mmol/L (ref 135–145)

## 2024-01-22 LAB — CBC
HCT: 27.8 % — ABNORMAL LOW (ref 36.0–46.0)
Hemoglobin: 8.1 g/dL — ABNORMAL LOW (ref 12.0–15.0)
MCH: 19.2 pg — ABNORMAL LOW (ref 26.0–34.0)
MCHC: 29.1 g/dL — ABNORMAL LOW (ref 30.0–36.0)
MCV: 66 fL — ABNORMAL LOW (ref 80.0–100.0)
Platelets: 227 K/uL (ref 150–400)
RBC: 4.21 MIL/uL (ref 3.87–5.11)
RDW: 19.9 % — ABNORMAL HIGH (ref 11.5–15.5)
WBC: 9.4 K/uL (ref 4.0–10.5)
nRBC: 0.2 % (ref 0.0–0.2)

## 2024-01-22 MED ORDER — SPIRONOLACTONE 25 MG PO TABS
25.0000 mg | ORAL_TABLET | Freq: Every day | ORAL | 0 refills | Status: DC
  Filled 2024-01-22: qty 30, 30d supply, fill #0

## 2024-01-22 MED ORDER — LIDOCAINE 5 % EX PTCH
1.0000 | MEDICATED_PATCH | Freq: Every day | CUTANEOUS | 0 refills | Status: AC
  Filled 2024-01-22: qty 10, 10d supply, fill #0

## 2024-01-22 MED ORDER — CEFADROXIL 500 MG PO CAPS
1000.0000 mg | ORAL_CAPSULE | Freq: Two times a day (BID) | ORAL | 0 refills | Status: AC
  Filled 2024-01-22: qty 16, 4d supply, fill #0

## 2024-01-22 NOTE — Plan of Care (Signed)
  Problem: Clinical Measurements: Goal: Respiratory complications will improve Outcome: Progressing Goal: Cardiovascular complication will be avoided Outcome: Progressing   Problem: Activity: Goal: Risk for activity intolerance will decrease Outcome: Progressing   Problem: Nutrition: Goal: Adequate nutrition will be maintained Outcome: Progressing   Problem: Pain Managment: Goal: General experience of comfort will improve and/or be controlled Outcome: Progressing   Problem: Safety: Goal: Ability to remain free from injury will improve Outcome: Progressing   Problem: Cardiovascular: Goal: Ability to achieve and maintain adequate cardiovascular perfusion will improve Outcome: Progressing Goal: Vascular access site(s) Level 0-1 will be maintained Outcome: Progressing

## 2024-01-22 NOTE — Plan of Care (Signed)
  Problem: Education: Goal: Knowledge of General Education information will improve Description: Including pain rating scale, medication(s)/side effects and non-pharmacologic comfort measures Outcome: Progressing   Problem: Clinical Measurements: Goal: Will remain free from infection Outcome: Progressing   Problem: Clinical Measurements: Goal: Respiratory complications will improve Outcome: Progressing   Problem: Clinical Measurements: Goal: Respiratory complications will improve Outcome: Progressing   Problem: Activity: Goal: Risk for activity intolerance will decrease Outcome: Progressing   Problem: Activity: Goal: Ability to return to baseline activity level will improve Outcome: Progressing   Problem: Health Behavior/Discharge Planning: Goal: Ability to manage health-related needs will improve Outcome: Progressing   Problem: Skin Integrity: Goal: Risk for impaired skin integrity will decrease Outcome: Progressing

## 2024-01-22 NOTE — TOC Initial Note (Signed)
 Transition of Care [facility]) - Initial/Assessment Note    Patient Details  Name: Taylor [name] MRN: [id number] Date of Birth: November 21, 1980  Transition [facility] Care [facility]) CM/SW Contact:    Sudie Erminio Deems, RN Phone Number: 01/22/2024, 2:15 PM  Clinical Narrative:   Patient presented for chest pain-Nstemi. PTA patient is from home with parents. Brother was at the bedside during the visit. Patient uses DME rolling walker in the home. Case Manager called [facility] to see if they can service the patient in [place]- They are unable to service the patient. Case Manager did offer outpatient PT in [place] and both are agreeable. Ambulatory referral submitted to [facility] Location and the office will call the patient within 3-5 business days. No further needs identified at this time.                Expected Discharge Plan: OP Rehab Barriers to Discharge: No Barriers Identified   Patient Goals and CMS Choice Patient states their goals for this hospitalization and ongoing recovery are:: Patient plans to transition home once stable   Choice offered to / list presented to : NA    Expected Discharge Plan and Services In-house Referral: NA Discharge Planning Services: CM Consult Post Acute Care Choice: NA Living arrangements for the past 2 months: Single Family Home                   DME Agency: NA       HH Arranged: NA (Unable to secure [facility] agency.)   Prior Living Arrangements/Services Living arrangements for the past 2 months: Single Family Home Lives with:: Parents Patient language and need for interpreter reviewed:: Yes Do you feel safe going back to the place where you live?: Yes      Need for Family Participation in Patient Care: No (Comment) Care giver support system in place?: No (comment) Current home services: DME (rolling walker) Criminal Activity/Legal Involvement Pertinent to Current Situation/Hospitalization: No - Comment as needed  Activities of Daily Living   ADL  Screening (condition at time of admission) Independently performs ADLs?: Yes (appropriate for developmental age) Is the patient deaf or have difficulty hearing?: No Does the patient have difficulty seeing, even when wearing glasses/contacts?: No Does the patient have difficulty concentrating, remembering, or making decisions?: No (since stroke in 2013 has short term memory loss)  Permission Sought/Granted Permission sought to share information with : Family Supports, Magazine features editor, Case Manager   Emotional Assessment Appearance:: Appears stated age Attitude/Demeanor/Rapport: Engaged Affect (typically observed): Appropriate Orientation: : Oriented to Self, Oriented to Place Alcohol / Substance Use: Not Applicable Psych Involvement: No (comment)  Admission diagnosis:  CAP (community acquired pneumonia) [J18.9] Patient Active Problem List   Diagnosis Date Noted   Cardiogenic shock (HCC) 01/21/2024   CAP (community acquired pneumonia) 01/19/2024   Non-ST elevation (NSTEMI) myocardial infarction (HCC) 01/19/2024   Systolic congestive heart failure (HCC) 01/18/2024   Atrial fibrillation (HCC) 01/18/2024   Long term (current) use of antithrombotics/antiplatelets 01/18/2024   Sinus tachycardia 01/18/2024   HLD (hyperlipidemia) 12/17/2023   Chronic systolic heart failure (HCC) 12/17/2023   Coronary artery disease involving native coronary artery of native heart 10/23/2023   Coronary artery disease involving native coronary artery of native heart with unstable angina pectoris (HCC) 10/22/2023   Unstable angina (HCC) 10/19/2023   Substernal chest pain 10/18/2023   Chest pain 10/17/2023   Pneumonia 10/17/2023   AKI (acute kidney injury) (HCC) 10/17/2023   Anemia 10/17/2023  Seizures (HCC)    Class 2 severe obesity due to excess calories with serious comorbidity and body mass index (BMI) of 38.0 to 38.9 in adult (HCC) 08/23/2023   At high risk for falls 08/23/2023    Claudication (HCC) 02/04/2022   Pain in right foot 07/22/2020   Diabetic foot infection (HCC) 06/23/2019   PAD (peripheral artery disease) (HCC)    Osteomyelitis of left foot (HCC)    Vertebral artery stenosis, left 03/25/2019   VBI (vertebrobasilar insufficiency) 02/20/2019   Intracranial carotid stenosis, left 01/23/2019   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   Tobacco use disorder 01/02/2015   Cognitive impairment 01/02/2015   Stroke (HCC) 09/07/2012   Pain in upper limb 09/07/2012   Pain in lower limb 09/07/2012   Bipolar affective disorder (HCC) 09/07/2012   Depressive disorder 09/07/2012   Type II diabetes mellitus (HCC) 09/07/2012   Memory loss 09/07/2012   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 - [place], [place] - [address] [place] [address] Phone: [phone] Fax: [phone]  [facility] [address] [place] [address] Phone: [phone] Fax: [phone]     Social Drivers of Health (SDOH) Social History: SDOH Screenings   Food Insecurity: No Food Insecurity (01/19/2024)  Housing: Low Risk  (01/19/2024)  Transportation Needs: No Transportation Needs (01/19/2024)  Utilities: Not At Risk (01/19/2024)  Alcohol Screen: Low Risk  (10/25/2023)  Depression (PHQ2-9): Medium Risk (11/14/2023)  Financial Resource Strain: Low Risk  (10/25/2023)  Physical Activity: Insufficiently Active (06/30/2023)   Received from [place] [facility]  Social Connections: Patient Unable To Answer (10/18/2023)  Stress: No Stress Concern Present (06/30/2023)   Received [facility]  Tobacco Use: Medium Risk (01/19/2024)  Health  Literacy: Medium Risk (06/30/2023)   [facility] from [facility]   SDOH Interventions:     Readmission Risk Interventions    10/26/2023   12:09 PM 02/09/2022   11:34 AM  Readmission Risk Prevention Plan  Post Dischage Appt  Complete  Medication Screening  Complete  Transportation Screening Complete Complete  HRI or Home Care Consult Complete   Social Work Consult for Recovery Care Planning/Counseling Complete   Palliative Care Screening Not Applicable   Medication Review Oceanographer) Complete

## 2024-01-22 NOTE — Discharge Summary (Addendum)
 Physician Discharge Summary  Taylor [name] FMW:[id number] DOB: 09-11-1980 DOA: 01/19/2024  PCP: [name], NP  Admit date: 01/19/2024 Discharge date: 01/23/2024 Recommendations for Outpatient Follow-up:  Follow up with PCP in 1 weeks-call for appointment Please obtain BMP/CBC in one week Follow up w/ ENT, OP SLP.  Discharge Dispo: home w/ [facility] Discharge Condition: Stable Code Status:   Code Status: Full Code Diet recommendation: Diet Heart Room service appropriate? Yes; Fluid consistency: Nectar Thick  Diet effective   Brief/Interim Summary: f BPD, multivessel CAD, prior CVA, ischemic cardiomyopathy, seizure disorder, chronic pain, HTN, PAD, and DM2 who is a poor historian and has some noticeable cognitive decline, who had ground-level fall at home and brought to the ED,Pt was unable to get herself up, and was helped up by her father, with whom she resides, within minutes (pt was down for an unknown amount of time per signout bedside RN, Jolaine, received).  In the OSH ED significantly elevated troponin  troponin 200s-->8000s-->18000s. CTA chest showed nondisplaced left 5th through 9th rib fractures,  left lung predominant peribronchovascular groundglass opacities and patchy consolidations, may represent pneumonia, pulmonary hemorrhage, and/or aspiration She was started on IV abx. EDP started hep gtt for ACS and consulted cardiology at [facility] and transferred for  NSTEMI Recent admission 10/2023 needing LHC with multivessel disease, but CABG deferred as patient deemed poor surgical candidate and had PCI with DES to mid/distal L CX. Patient transferred out of ICU 8/9 Overall she is doing well currently on medical management, Remains medically stable after slp eval and MBS  Subjective: Seen and examined this morning Patient had issue with taking meds/swallowing with coughing each time so a speech eval was pending prior to discharge  Discharge diagnoses: Assessment and plan    NSTEMI Multivessel CAD-recent PCI in May, poor candidate for CABG Ischemic cardiomyopathy Cardiogenic shock due to NSTEMI-needing vasopressors during procedure-subsequently discontinued. Chronic systolic CHF previous EF 35-40% in May/9/25: S/P urgent cardiac cath 8/8, with subacute occlusion distal RCA-on medical therapy with DAPT, Lipitor .  Off heparin  Needed vasopressors during procedure.Vitals stable. TTE- LEF ~35% indeterminate diastolic  fun,normal MV. Continue current DAPT, Toprol , Aldactone , Ranexa  and Lipitor  80.  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   CAP with significant leukocytosis: WBC has normalized.  Respiratory status stable.  Continue antibiotics  po to complete.  Seizure disorder: Stable, continue Keppra   Chronic anemia: Baseline ~8 to 9 g: Noted some drop, but overall is stable. Anemia panel in may slightly low iron  otherwise unremarkablke-added iron  po  Fall at home Physical deconditioning Polypharmacy Chronic pain Nondisplaced left 5th through 9th rib fracture in CT at OSH: Continue multimodal pain management-does have allergy to morphine  oxycodone  but able to take Norco WHICH she is on chronically Continue  Lidocaine  patch, hydrocodone  PT OT IS, pulmonary toileting Continue home Flexeril , Valium  twice daily, fluoxetine -minimize sedatives baclofen , Lyrica  for  now Mobility: PT Orders: Active PT Follow up Rec: Home Health Pt8/03/2024 1411   TOC unable to find HH so OP PT will be arranged after discussion with patient and family.  Bipolar disorder Cognitive decline: Mood stable, continue Risperdal  at bedtime trazodone , supportive care delirium precaution fall precaution Reports her family manages her medication at home.  She is unable to tell her doctor she usually follows   Obesity There is no height or weight on file to calculate BMI.: Will benefit with PCP follow-up, weight loss,healthy lifestyle and outpatient sleep eval if not done.  Mobility: PT Orders: cont ptit PT Follow up Rec: Home Health Pt8/03/2024 1411   DVT prophylaxis: HEPARIN  GTT Code Status:   Code Status: Full Code Family Communication: plan of care discussed with patient at bedside. Patient status is: Remains hospitalized because of severity of illness Level of care: Telemetry Cardiac    Dispo: The patient is from: Home lives with parents            Anticipated disposition: Home Home with family pending Slp clerance  Objective: Vitals last 24 hrs: Vitals:   01/22/24 1945 01/23/24 0007 01/23/24 0419 01/23/24 0753  BP: 116/70 107/62 116/71 112/67  Pulse: 89 85 84 88  Resp: 16 20 18 18   Temp: 97.6 F (36.4 C) 97.9 F (36.6 C) 97.8 F (36.6 C) 97.6 F (36.4 C)  TempSrc: Oral Oral Oral Oral  SpO2: 96% 93% 96% 97%    Physical Examination: General exam: AAOX3,  obese HEENT:Oral mucosa moist, Ear/Nose WNL grossly Respiratory system: Bilaterally clear BS,no use of accessory muscle Cardiovascular system: S1 & S2 +, No JVD. Gastrointestinal system: Abdomen soft, NT,ND, BS+ Nervous System: Alert, awake, moving all extremities,and following commands. Extremities: LE edema neg, distal extremities warm.  Skin: No rashes,no icterus. MSK: Normal muscle bulk,tone, power   Medications reviewed:  Scheduled Meds:  aspirin  EC  81 mg Oral Daily   atorvastatin   80 mg Oral  Daily   Chlorhexidine  Gluconate Cloth  6 each Topical Daily   cyclobenzaprine   10 mg Oral TID   dapagliflozin  propanediol  10 mg Oral Daily   diazepam   5 mg Oral BID   FLUoxetine   20 mg Oral q AM   free water   250 mL Oral Once   hydrOXYzine   25 mg Oral TID   insulin  aspart  0-15 Units Subcutaneous TID WC   insulin  aspart  0-5 Units Subcutaneous QHS   levETIRAcetam   1,000 mg Oral Daily   lidocaine   1 patch Transdermal Daily   metoprolol  succinate  12.5 mg Oral Daily   pantoprazole   40 mg Oral Daily   ranolazine   1,000 mg Oral BID   risperiDONE   0.5 mg Oral QHS   sodium chloride  flush  3 mL Intravenous Q12H   spironolactone   25 mg Oral Daily   ticagrelor   90 mg Oral BID   traZODone   200 mg Oral QHS   Continuous Infusions:  sodium chloride  10 mL/hr at 01/23/24 0900   Diet: Diet Order             Diet - low sodium heart healthy           Diet Heart Room service appropriate? Yes; Fluid consistency: Thin  Diet effective now                   Procedure(s) (LRB): LEFT HEART CATH AND CORONARY ANGIOGRAPHY (N/A) RIGHT HEART CATH (N/A)  Consultation: See note.  Discharge Instructions  Discharge Instructions     (  HEART FAILURE PATIENTS) Call MD:  Anytime you have any of the following symptoms: 1) 3 pound weight gain in 24 hours or 5 pounds in 1 week 2) shortness of breath, with or without a dry hacking cough 3) swelling in the hands, feet or stomach 4) if you have to sleep on extra pillows at night in order to breathe.   Complete by: As directed    AMB referral to Phase II Cardiac Rehabilitation   Complete by: As directed    Diagnosis: NSTEMI   After initial evaluation and assessments completed: Virtual Based Care may be provided alone or in conjunction with Phase 2 Cardiac Rehab based on patient barriers.: Yes   Intensive Cardiac Rehabilitation (ICR) [facility] location only OR Traditional Cardiac Rehabilitation (TCR) *If criteria for ICR are not met will enroll in TCR ([facility] only):  Yes   Ambulatory referral to Physical Therapy   Complete by: As directed    Outpatient Physical Therapy evaluation and treatment   Diet - low sodium heart healthy   Complete by: As directed    Discharge instructions   Complete by: As directed    Please call call MD or return to ER for similar or worsening recurring problem that brought you to hospital or if any fever,nausea/vomiting,abdominal pain, uncontrolled pain, chest pain,  shortness of breath or any other alarming symptoms.  Please follow-up your doctor as instructed in a week time and call the office for appointment.  Please avoid alcohol, smoking, or any other illicit substance and maintain healthy habits including taking your regular medications as prescribed.  You were cared for by a hospitalist during your hospital stay. If you have any questions about your discharge medications or the care you received while you were in the hospital after you are discharged, you can call the unit and ask to speak with the hospitalist on call if the hospitalist that took care of you is not available.  Once you are discharged, your primary care physician will handle any further medical issues. Please note that NO REFILLS for any discharge medications will be authorized once you are discharged, as it is imperative that you return to your primary care physician (or establish a relationship with a primary care physician if you do not have one) for your aftercare needs so that they can reassess your need for medications and monitor your lab values   Increase activity slowly   Complete by: As directed       Allergies as of 01/23/2024       Reactions   Amitriptyline Other (See Comments)   Severe dissociative state   Contrast Media [iodinated Contrast Media] Other (See Comments)   2013-had CVA, went into AFR-combination ABT, contrast, low BP   Morphine  And Codeine Other (See Comments)   Chest pain   Promethazine  Swelling   Lips swell   Compazine  Other (See Comments)   -Alert but unable to move   Prochlorperazine Maleate Other (See Comments)   -Alert, but unable to move   Adhesive [tape] Rash   Long term exposure causes skin to tear    Oxycodone  Nausea And Vomiting   Severe n/v -  patient will not take oxycodone  or percocet.   Sumatriptan Rash        Medication List     STOP taking these medications    busPIRone  5 MG tablet Commonly known as: BUSPAR    ivabradine  7.5 MG Tabs tablet Commonly known as: CORLANOR    pregabalin  150 MG capsule Commonly  known as: LYRICA    sacubitril -valsartan  24-26 MG Commonly known as: ENTRESTO    tiZANidine  2 MG tablet Commonly known as: ZANAFLEX        TAKE these medications    aspirin  EC 81 MG tablet Take 81 mg by mouth daily.   atorvastatin  80 MG tablet Commonly known as: LIPITOR  TAKE 1 TABLET BY MOUTH DAILY   blood glucose meter kit and supplies Dispense based on patient and insurance preference. Use up to four times daily as directed. (FOR ICD-10 E10.9, E11.9).   cefadroxil  500 MG capsule Commonly known as: DURICEF Take 2 capsules (1,000 mg total) by mouth 2 (two) times daily for 4 days.   cyclobenzaprine  10 MG tablet Commonly known as: FLEXERIL  Take 10 mg by mouth 3 (three) times daily.   diazepam  5 MG tablet Commonly known as: VALIUM  Take 5 mg by mouth 2 (two) times daily.   Farxiga  10 MG Tabs tablet Generic drug: dapagliflozin  propanediol Take 10 mg by mouth daily.   ferrous gluconate  324 MG tablet Commonly known as: FERGON Take 324 mg by mouth daily.   FLUoxetine  10 MG capsule Commonly known as: PROZAC  Take 20 mg by mouth in the morning.   HYDROcodone -acetaminophen  5-325 MG tablet Commonly known as: NORCO/VICODIN Take 1 tablet by mouth every 6 (six) hours as needed for severe pain (pain score 7-10).   hydrOXYzine  25 MG capsule Commonly known as: VISTARIL  Take 25 mg by mouth 3 (three) times daily.   levETIRAcetam  500 MG 24 hr tablet Commonly  known as: KEPPRA  XR Take 2 tablets (1,000 mg total) by mouth daily.   lidocaine  5 % Commonly known as: LIDODERM  Place 1 patch onto the skin daily for 10 days. Remove & Discard patch within 12 hours or as directed by MD   magnesium  oxide 400 MG tablet Commonly known as: MAG-OX Take 400 mg by mouth daily.   metFORMIN  500 MG tablet Commonly known as: GLUCOPHAGE  Take 500 mg by mouth 3 (three) times daily.   metoprolol  succinate 25 MG 24 hr tablet Commonly known as: TOPROL -XL Take 0.5 tablets (12.5 mg total) by mouth daily.   nitroGLYCERIN  0.4 MG SL tablet Commonly known as: NITROSTAT  Place 1 tablet (0.4 mg total) under the tongue every 5 (five) minutes as needed for chest pain.   Ozempic  (0.25 or 0.5 MG/DOSE) 2 MG/3ML Sopn Generic drug: Semaglutide (0.25 or 0.5MG /DOS) Inject 0.25 mg into the skin once a week. Mondays   pantoprazole  40 MG tablet Commonly known as: PROTONIX  Take 1 tablet (40 mg total) by mouth daily.   ranolazine  1000 MG SR tablet Commonly known as: Ranexa  Take 1 tablet (1,000 mg total) by mouth 2 (two) times daily.   risperiDONE  0.5 MG tablet Commonly known as: RISPERDAL  Take 0.5 mg by mouth at bedtime.   spironolactone  25 MG tablet Commonly known as: ALDACTONE  Take 1 tablet (25 mg total) by mouth daily. What changed: how much to take   ticagrelor  90 MG Tabs tablet Commonly known as: BRILINTA  Take 1 tablet (90 mg total) by mouth 2 (two) times daily.   traMADol  50 MG tablet Commonly known as: ULTRAM  Take 50 mg by mouth 4 (four) times daily.   traZODone  100 MG tablet Commonly known as: DESYREL  Take 200 mg by mouth at bedtime.        Follow-up Information     [name], NP Follow up in 1 week(s).   Contact information: [address], [address] [place] [address] [phone]  [name], NP Follow up in 1 week(s).   Contact information: [address] [place] [address] [phone]         [facility]  Outpatient Orthopedic Rehabilitation at [facility] Follow up.   Specialty: Rehabilitation Why: Outpatient Physical Therapy-office to call with visit times. Contact information: [address] [place]  [address] [phone]               Allergies  Allergen Reactions   Amitriptyline Other (See Comments)    Severe dissociative state   Contrast Media [Iodinated Contrast Media] Other (See Comments)    2013-had CVA, went into AFR-combination ABT, contrast, low BP   Morphine  And Codeine Other (See Comments)    Chest pain   Promethazine  Swelling    Lips swell   Compazine Other (See Comments)    -Alert but unable to move   Prochlorperazine Maleate Other (See Comments)    -Alert, but unable to move   Adhesive [Tape] Rash    Long term exposure causes skin to tear    Oxycodone  Nausea And Vomiting    Severe n/v -  patient will not take oxycodone  or percocet.   Sumatriptan Rash    The results of significant diagnostics from this hospitalization (including imaging, microbiology, ancillary and laboratory) are listed below for reference.    Microbiology: Recent Results (from the past 240 hours)  MRSA Next Gen by PCR, Nasal     Status: None   Collection Time: 01/19/24  8:33 PM   Specimen: Nasal Mucosa; Nasal Swab  Result Value Ref Range Status   MRSA by PCR Next Gen NOT DETECTED NOT DETECTED Final    Comment: (NOTE) The GeneXpert MRSA Assay (FDA approved for NASAL specimens only), is one component of a comprehensive MRSA colonization surveillance program. It is not intended to diagnose MRSA infection nor to guide or monitor treatment for MRSA infections. Test performance is not FDA approved in patients less than 74 years old. Performed at [facility], [address], [place], [place] [address]     Procedures/Studies: ECHOCARDIOGRAM COMPLETE Result Date: 01/20/2024    ECHOCARDIOGRAM REPORT   Patient Name:   KARRIE FLUELLEN Date of Exam: 01/20/2024 Medical Rec #:   [id number]     Height:       64.0 in Accession #:    [id number]    Weight:       212.0 lb Date of Birth:  15-May-1981      BSA:          2.005 m Patient Age:    43 years      BP:           120/75 mmHg Patient Gender: F             HR:           89 bpm. Exam Location:  Inpatient Procedure: 2D Echo, 3D Echo, Cardiac Doppler and Color Doppler (Both Spectral            and Color Flow Doppler were utilized during procedure). Indications:    Chest Pain R07.9  History:        Patient has prior history of Echocardiogram examinations, most                 recent 10/20/2023. NSTEMI and CAD, hx of Stroke,                 Signs/Symptoms:Chest Pain; Risk Factors:Diabetes.  Sonographer:  Koleen Popper RDCS Referring Phys: [id number] [facility]  Sonographer Comments: Patient is obese. IMPRESSIONS  1. Left ventricular ejection fraction, by estimation, is 35%. The left ventricle has moderately decreased function. The left ventricle demonstrates global hypokinesis. Left ventricular diastolic parameters are indeterminate.  2. Right ventricular systolic function is normal. The right ventricular size is normal.  3. The mitral valve is normal in structure. Trivial mitral valve regurgitation. No evidence of mitral stenosis.  4. The aortic valve has an indeterminant number of cusps. Aortic valve regurgitation is not visualized. No aortic stenosis is present.  5. The inferior vena cava is normal in size with <50% respiratory variability, suggesting right atrial pressure of 8 mmHg. FINDINGS  Left Ventricle: Left ventricular ejection fraction, by estimation, is 35%. The left ventricle has moderately decreased function. The left ventricle demonstrates global hypokinesis. The left ventricular internal cavity size was normal in size. There is no left ventricular hypertrophy. Left ventricular diastolic parameters are indeterminate. Right Ventricle: The right ventricular size is normal. Right vetricular wall thickness was not well visualized. Right  ventricular systolic function is normal. Left Atrium: Left atrial size was normal in size. Right Atrium: Right atrial size was normal in size. Pericardium: There is no evidence of pericardial effusion. Mitral Valve: The mitral valve is normal in structure. Trivial mitral valve regurgitation. No evidence of mitral valve stenosis. Tricuspid Valve: The tricuspid valve is normal in structure. Tricuspid valve regurgitation is not demonstrated. No evidence of tricuspid stenosis. Aortic Valve: The aortic valve has an indeterminant number of cusps. Aortic valve regurgitation is not visualized. No aortic stenosis is present. Aortic valve mean gradient measures 4.8 mmHg. Aortic valve peak gradient measures 10.3 mmHg. Aortic valve area, by VTI measures 1.53 cm. Pulmonic Valve: The pulmonic valve was not well visualized. Pulmonic valve regurgitation is not visualized. No evidence of pulmonic stenosis. Aorta: The aortic root and ascending aorta are structurally normal, with no evidence of dilitation. Venous: The inferior vena cava is normal in size with less than 50% respiratory variability, suggesting right atrial pressure of 8 mmHg. IAS/Shunts: The interatrial septum was not well visualized. Additional Comments: 3D was performed not requiring image post processing on an independent workstation and was abnormal.  LEFT VENTRICLE PLAX 2D LVIDd:         4.90 cm   Diastology LVIDs:         4.17 cm   LV e' medial:    7.29 cm/s LV PW:         1.00 cm   LV E/e' medial:  15.0 LV IVS:        0.91 cm   LV e' lateral:   12.10 cm/s LVOT diam:     1.79 cm   LV E/e' lateral: 9.0 LV SV:         46 LV SV Index:   23 LVOT Area:     2.52 cm                           3D Volume EF:                          3D EF:        31 %                          LV EDV:       163 ml  LV ESV:       113 ml                          LV SV:        51 ml RIGHT VENTRICLE             IVC RV S prime:     19.20 cm/s  IVC diam: 2.07 cm TAPSE  (M-mode): 2.0 cm LEFT ATRIUM           Index        RIGHT ATRIUM          Index LA diam:      4.55 cm 2.27 cm/m   RA Area:     9.24 cm LA Vol (A2C): 36.1 ml 18.00 ml/m  RA Volume:   16.50 ml 8.23 ml/m LA Vol (A4C): 46.8 ml 23.34 ml/m  AORTIC VALVE AV Area (Vmax):    1.47 cm AV Area (Vmean):   1.55 cm AV Area (VTI):     1.53 cm AV Vmax:           160.42 cm/s AV Vmean:          103.086 cm/s AV VTI:            0.302 m AV Peak Grad:      10.3 mmHg AV Mean Grad:      4.8 mmHg LVOT Vmax:         93.80 cm/s LVOT Vmean:        63.400 cm/s LVOT VTI:          0.184 m LVOT/AV VTI ratio: 0.61  AORTA Ao Root diam: 2.53 cm Ao Asc diam:  2.84 cm MITRAL VALVE MV Area (PHT): 5.13 cm     SHUNTS MV Decel Time: 148 msec     Systemic VTI:  0.18 m MV E velocity: 109.00 cm/s  Systemic Diam: 1.79 cm MV A velocity: 108.00 cm/s MV E/A ratio:  1.01 Dorn Ross MD Electronically signed by Dorn Ross MD Signature Date/Time: 01/20/2024/1:05:26 PM    Final    Port CXR Result Date: 01/19/2024 CLINICAL DATA:  Central line placement EXAM: PORTABLE CHEST 1 VIEW COMPARISON:  Chest x-ray 07/19/2023 FINDINGS: Right-sided central venous catheter tip is at the level of the right main pulmonary artery. The heart is enlarged, unchanged the lungs are clear. There is no pleural effusion or pneumothorax. No acute fractures are seen. IMPRESSION: Right-sided central venous catheter tip is at the level of the right main pulmonary artery. Electronically Signed   By: Greig Pique M.D.   On: 01/19/2024 19:40   CARDIAC CATHETERIZATION Addendum Date: 01/19/2024   Dist LM lesion is 50% stenosed.   Prox LAD lesion is 70% stenosed.   Mid LAD lesion is 40% stenosed.   Ost Cx to Prox Cx lesion is 70% stenosed.   Dist Cx lesion is 50% stenosed.   Dist RCA lesion is 100% stenosed.   Prox RCA lesion is 65% stenosed.   Mid RCA lesion is 99% stenosed.   1st Diag lesion is 70% stenosed.   Non-stenotic Mid Cx to Dist Cx lesion was previously treated. 1.   Subacute occlusion of distal right coronary artery that is now collateralized by the septal perforators.  Given the poor outflow of this vessel based on previous angiogram, no PCI was pursued. 2.  Patent mid left circumflex stent with moderate, diffuse disease of the LAD. 3.  LVEDP of 31 mmHg  4.  Given ongoing hypotension in the lab, a Swan-Ganz catheter was placed from the right IJ approach with the following hemodynamics:  Fick cardiac output of 6.25 L/min, Fick cardiac index of 3.1 L/min/m TD output of 5.5 L/min, TD index of 2.7 L/min/m  Right atrial pressure mean of 12 mmHg  RV of 48/8 with an end-diastolic pressure of 15 mmHg  Wedge pressure mean of 26 mmHg with V waves to 32 mmHg  PA pressure 56/21 9 with a mean of 41 mmHg PVR of 2.4 (Fick);  2.7 (TD) PA pulsatility index of 2.25 5.  Point-of-care lactate in cardiac catheterization laboratory of 0.6 with reassuring ABG. Recommendation: Given mean arterial pressures of greater than 70 mm, norepinephrine  was weaned off.  Medical therapy for occluded right coronary artery with resumption of heparin  drip, dual antiplatelet therapy, and pain control.  Given patient's morphine  and codeine allergy, scheduled Toradol  will be administered.  Results reviewed with Dr. Cherrie, the 2H attending, who will assume care.  Result Date: 01/19/2024   Dist LM lesion is 50% stenosed.   Prox LAD lesion is 70% stenosed.   Mid LAD lesion is 40% stenosed.   Ost Cx to Prox Cx lesion is 70% stenosed.   Dist Cx lesion is 50% stenosed.   Dist RCA lesion is 100% stenosed.   Prox RCA lesion is 65% stenosed.   Mid RCA lesion is 99% stenosed.   1st Diag lesion is 70% stenosed.   Non-stenotic Mid Cx to Dist Cx lesion was previously treated. 1.  Subacute occlusion of distal right coronary artery that is now collateralized by the septal perforators.  Given the poor outflow of this vessel based on previous angiogram, no PCI was pursued. 2.  Patent mid left circumflex stent with moderate,  diffuse disease of the LAD. 3.  LVEDP of 31 mmHg 4.  Given ongoing hypotension in the lab, a Swan-Ganz catheter was placed from the right IJ approach with the following hemodynamics:  Fick cardiac output of 6.25 L/min, Fick cardiac index of 3.1 L/min/m TD output of 5.5 L/min, TD index of 2.7 L/min/m  Right atrial pressure mean of 12 mmHg  RV of 48/8 with an end-diastolic pressure of 15 mmHg  Wedge pressure mean of 26 mmHg with V waves to 32 mmHg  PA pressure 56/21 9 with a mean of 41 mmHg PVR of 2.4 (Fick);  2.7 (TD) PA pulsatility index of 2.25 5.  Point-of-care lactate in cardiac catheterization laboratory of 0.6 with reassuring ABG. Recommendation: Given mean arterial pressures of greater than 70 mm, norepinephrine  was weaned off.  Medical therapy for occluded right coronary artery with resumption of heparin  drip, dual antiplatelet therapy, and pain control.  Given patient's morphine  and codeine allergy, scheduled Toradol  will be administered.  Given elevated filling pressures, will administer Lasix  10 mg IV x 1 as patient is Lasix  nave.  Results reviewed with Dr. Cherrie, the 2H attending, who will assume care.    Labs: BNP (last 3 results) Recent Labs    10/17/23 1625 01/19/24 2041  BNP 353.2* 385.9*   Basic Metabolic Panel: Recent Labs  Lab 01/19/24 2033 01/20/24 0530 01/21/24 0438 01/22/24 0544 01/23/24 0557  NA 137 137 139 139 138  K 3.8 4.1 3.5 3.7 3.7  CL 103 105 104 106 103  CO2 23 23 25 23 24   GLUCOSE 221* 226* 162* 119* 134*  BUN 13 13 22* 15 14  CREATININE 0.97 0.91 1.14* 0.99 1.03*  CALCIUM  8.6* 8.8* 8.7* 8.8*  9.0  MG 2.0  --   --   --   --    Liver Function Tests: Recent Labs  Lab 01/19/24 2033  AST 71*  ALT 24  ALKPHOS 65  BILITOT 0.7  PROT 6.6  ALBUMIN 3.0*   No results for input(s): LIPASE, AMYLASE in the last 168 hours. No results for input(s): AMMONIA in the last 168 hours. CBC: Recent Labs  Lab 01/19/24 1825 01/19/24 2041 01/20/24 0530  01/21/24 0438 01/22/24 0544  WBC  --  21.7* 22.5* 14.9* 9.4  HGB 9.5* 8.2* 8.0* 7.7* 8.1*  HCT 28.0* 29.1* 28.3* 27.2* 27.8*  MCV  --  68.1* 68.2* 67.8* 66.0*  PLT  --  233 246 223 227   CBG: Recent Labs  Lab 01/22/24 1155 01/22/24 1559 01/22/24 2134 01/23/24 0756 01/23/24 1159  GLUCAP 117* 127* 147* 154* 169*   Recent Labs  Lab 01/21/24 1239  CKTOTAL 70  Urinalysis    Component Value Date/Time   COLORURINE YELLOW 02/04/2022 0559   APPEARANCEUR CLEAR 02/04/2022 0559   LABSPEC 1.020 02/04/2022 0559   PHURINE 6.0 02/04/2022 0559   GLUCOSEU >=500 (A) 02/04/2022 0559   HGBUR NEGATIVE 02/04/2022 0559   BILIRUBINUR NEGATIVE 02/04/2022 0559   KETONESUR NEGATIVE 02/04/2022 0559   PROTEINUR NEGATIVE 02/04/2022 0559   UROBILINOGEN 0.2 03/02/2012 1829   NITRITE NEGATIVE 02/04/2022 0559   LEUKOCYTESUR NEGATIVE 02/04/2022 0559   Sepsis Labs Recent Labs  Lab 01/19/24 2041 01/20/24 0530 01/21/24 0438 01/22/24 0544  WBC 21.7* 22.5* 14.9* 9.4   Microbiology Recent Results (from the past 240 hours)  MRSA Next Gen by PCR, Nasal     Status: None   Collection Time: 01/19/24  8:33 PM   Specimen: Nasal Mucosa; Nasal Swab  Result Value Ref Range Status   MRSA by PCR Next Gen NOT DETECTED NOT DETECTED Final    Comment: (NOTE) The GeneXpert MRSA Assay (FDA approved for NASAL specimens only), is one component of a comprehensive MRSA colonization surveillance program. It is not intended to diagnose MRSA infection nor to guide or monitor treatment for MRSA infections. Test performance is not FDA approved in patients less than 4 years old. Performed at [facility], [address], [place], [place] [address]    Time coordinating discharge: 35 minutes  SIGNED: Mennie LAMY, MD  [facility] 01/23/2024, 1:46 PM  If 7PM-7AM, please contact night-coverage [email or web address]

## 2024-01-22 NOTE — Progress Notes (Signed)
 Mobility Specialist: Progress Note   01/22/24 1600  Mobility  Activity Ambulated with assistance  Level of Assistance Contact guard assist, steadying assist  Assistive Device Front wheel walker  Distance Ambulated (ft) 60 ft  Activity Response Tolerated well  Mobility Referral Yes  Mobility visit 1 Mobility  Mobility Specialist Start Time (ACUTE ONLY) 1035  Mobility Specialist Stop Time (ACUTE ONLY) 1045  Mobility Specialist Time Calculation (min) (ACUTE ONLY) 10 min    Pt received in bed, agreeable to mobility session. CGA throughout. C/o bil rib pain. Ambulated down the hallway and back without fault. Left in chair with all needs met, call bell in reach.   Ileana Lute Mobility Specialist Please contact via SecureChat or Rehab office at [phone]

## 2024-01-23 ENCOUNTER — Other Ambulatory Visit (HOSPITAL_COMMUNITY): Payer: Self-pay

## 2024-01-23 ENCOUNTER — Inpatient Hospital Stay (HOSPITAL_COMMUNITY)

## 2024-01-23 DIAGNOSIS — I214 Non-ST elevation (NSTEMI) myocardial infarction: Secondary | ICD-10-CM | POA: Diagnosis not present

## 2024-01-23 LAB — BASIC METABOLIC PANEL WITH GFR
Anion gap: 11 (ref 5–15)
BUN: 14 mg/dL (ref 6–20)
CO2: 24 mmol/L (ref 22–32)
Calcium: 9 mg/dL (ref 8.9–10.3)
Chloride: 103 mmol/L (ref 98–111)
Creatinine, Ser: 1.03 mg/dL — ABNORMAL HIGH (ref 0.44–1.00)
GFR, Estimated: >60 mL/min (ref >60)
Glucose, Bld: 134 mg/dL — ABNORMAL HIGH (ref 70–99)
Potassium: 3.7 mmol/L (ref 3.5–5.1)
Sodium: 138 mmol/L (ref 135–145)

## 2024-01-23 LAB — GLUCOSE, CAPILLARY
Glucose-Capillary: 154 mg/dL — ABNORMAL HIGH (ref 70–99)
Glucose-Capillary: 169 mg/dL — ABNORMAL HIGH (ref 70–99)

## 2024-01-23 NOTE — Progress Notes (Signed)
 Physical Therapy Treatment Patient Details Name: Taylor [name] MRN: [id number] DOB: [birth year] Today's Date: 01/23/2024   History of Present Illness Pt is a 43 y.o. female admitted 8/8 for fall at home. In ED had elevated troponin, CTA of chest showed nondisplaced L 5-9 rib fractures, L lung predominant peribronchovascular groundglass opacities and patchy consolidations, may represent PNA, pulmonary hemorrhage, and/or aspiration. Pt also with cardiogenic shock 2/2 NSTEMI. 8/8 s/p urgent cardiac cath with subacute occlusion distal RCA. 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 Pt frustrated upon arrival due to pain and perseverating on pain meds (pt meds had been given 30 min prior to session). The pt required increased assist for bed mobility due to pain, but plans to sleep in recliner at home to reduce pain. Pt then able to complete multiple sit-stand from EOB with CGA and use of RW for balance to manage LB dressing. Pt needing increased cues for safety, will need constant supervision for OOB mobility with self-care at home as pt with increased sway and fatigue with standing. Pt declined further ambulation, but reports she feels capable of completing car transfer and mobility in the home until HHPT begins. Recommendations remain appropriate.    If plan is discharge home, recommend the following: A lot of help with walking and/or transfers;A lot of help with bathing/dressing/bathroom;Assistance with cooking/housework;Direct supervision/assist for medications management;Direct supervision/assist for financial management;Assist for transportation;Help with stairs or ramp for entrance   Can travel by private  vehicle        Equipment Recommendations  None recommended by PT    Recommendations for Other Services       Precautions / Restrictions Precautions Precautions: Fall Recall of Precautions/Restrictions: Intact Precaution/Restrictions Comments: L rib fxs 5-9 Restrictions Weight Bearing Restrictions Per Provider Order: No     Mobility  Bed Mobility Overal bed mobility: Needs Assistance Bed Mobility: Supine to Sit     Supine to sit: Min assist, Used rails, HOB elevated     General bed mobility comments: minA to pull to sit at EOB, use of rails. plan to sleep in recliner at home    Transfers Overall transfer level: Needs assistance Equipment used: Rolling walker (2 wheels) Transfers: Sit to/from Stand Sit to Stand: Contact guard assist   Step pivot transfers: Contact guard assist       General transfer comment: CGA for safety with use of RW    Ambulation/Gait Ambulation/Gait assistance: Contact guard assist Gait Distance (Feet): 5 Feet Assistive device: Rolling walker (2 wheels), None Gait Pattern/deviations: Step-through pattern, Decreased stride length Gait velocity: decr Gait velocity interpretation: <1.31 ft/sec, indicative of household ambulator   General Gait Details: pt generally declining further gait in room, CGA for mobility from EOB to recliner with use of RW, reports fatigue     Balance Overall balance assessment: Needs assistance Sitting-balance support: No upper extremity supported, Feet supported Sitting balance-Leahy Scale: Fair     Standing balance support: Bilateral upper extremity supported, During functional activity, Reliant on assistive device for balance Standing balance-Leahy Scale: Poor Standing balance comment: Reliant on RW  Communication Communication Communication: No apparent difficulties Factors Affecting Communication: Reduced clarity of speech  Cognition Arousal: Alert Behavior  During Therapy: WFL for tasks assessed/performed, Agitated   PT - Cognitive impairments: Safety/Judgement                       PT - Cognition Comments: Agitated about not receiving pain medicine (but pt had received pain medication 30 min prior to session) and frequent redirection for safety Following commands: Intact      Cueing Cueing Techniques: Verbal cues  Exercises      General Comments General comments (skin integrity, edema, etc.): Pt asking several times about pain meds (recieved earlier, STM deficits at baseline), family present for discharge information      Pertinent Vitals/Pain Pain Assessment Pain Assessment: Faces Faces Pain Scale: Hurts even more Pain Location: L sided rib pain Pain Descriptors / Indicators: Discomfort Pain Intervention(s): Limited activity within patient's tolerance, Monitored during session, Repositioned     PT Goals (current goals can now be found in the care plan section) Acute Rehab PT Goals Patient Stated Goal: to get home PT Goal Formulation: With patient/family Time For Goal Achievement: 02/04/24 Potential to Achieve Goals: Good Progress towards PT goals: Progressing toward goals    Frequency    Min 2X/week       AM-PAC PT 6 Clicks Mobility   Outcome Measure  Help needed turning from your back to your side while in a flat bed without using bedrails?: A Little Help needed moving from lying on your back to sitting on the side of a flat bed without using bedrails?: A Little Help needed moving to and from a bed to a chair (including a wheelchair)?: A Little Help needed standing up from a chair using your arms (e.g., wheelchair or bedside chair)?: A Little Help needed to walk in hospital room?: A Little Help needed climbing 3-5 steps with a railing? : A Lot 6 Click Score: 17    End of Session Equipment Utilized During Treatment: Gait belt Activity Tolerance: Patient tolerated treatment well Patient left: in  chair;with call bell/phone within reach;with chair alarm set;with family/visitor present Nurse Communication: Mobility status;Patient requests pain meds PT Visit Diagnosis: Unsteadiness on feet (R26.81);Other abnormalities of gait and mobility (R26.89);Muscle weakness (generalized) (M62.81);History of falling (Z91.81)     Time: 8568-8540 PT Time Calculation (min) (ACUTE ONLY): 28 min  Charges:    $Therapeutic Activity: 23-37 mins PT General Charges $$ ACUTE PT VISIT: 1 Visit                     Izetta Call, PT, DPT   Acute Rehabilitation Department Office [phone] Secure Chat Communication Preferred   Izetta JULIANNA Call 01/23/2024, 3:42 PM

## 2024-01-23 NOTE — Progress Notes (Signed)
 PROGRESS NOTE Taylor [name]  FMW:[id number] DOB: Apr 17, 1981 DOA: 01/19/2024 PCP: [name], NP  Brief Narrative/Hospital Course: f BPD, multivessel CAD, prior CVA, ischemic cardiomyopathy, seizure disorder, chronic pain, HTN, PAD, and DM2 who is a poor historian and has some noticeable cognitive decline, who had ground-level fall at home and brought to the ED,Pt was unable to get herself up, and was helped up by her father, with whom she resides, within minutes (pt was down for an unknown amount of time per signout bedside RN, Jolaine, received).  In the OSH ED significantly elevated troponin  troponin 200s-->8000s-->18000s. CTA chest showed nondisplaced left 5th through 9th rib fractures,  left lung predominant peribronchovascular groundglass opacities and patchy consolidations, may represent pneumonia, pulmonary hemorrhage, and/or aspiration She was started on IV abx. EDP started hep gtt for ACS and consulted cardiology at [facility] and transferred for  NSTEMI Recent admission 10/2023 needing LHC with multivessel disease, but CABG deferred as patient deemed poor surgical candidate and had PCI with DES to mid/distal L CX. Patient transferred out of ICU 8/9 Overall she is doing well currently on medical management, Remains medically stable after slp eval and MBS  Subjective: Seen and examined this morning Patient had issue with taking meds/swallowing with coughing each time so a speech eval was pending prior to discharge  Discharge diagnoses: Assessment and plan   NSTEMI Multivessel CAD-recent PCI in May, poor candidate for CABG Ischemic cardiomyopathy Cardiogenic shock due to NSTEMI-needing vasopressors during procedure-subsequently discontinued. Chronic systolic CHF previous EF 35-40% in May/9/25: S/P urgent cardiac cath 8/8, with subacute occlusion distal RCA-on medical therapy with DAPT, Lipitor .  Off heparin  Needed vasopressors during procedure.Vitals stable. TTE- LEF ~35% indeterminate  diastolic  fun,normal MV. Continue current DAPT, Toprol , Aldactone , Ranexa  and Lipitor  80.  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   CAP with significant leukocytosis: WBC has normalized.  Respiratory status stable.  Continue antibiotics  po to complete.  Seizure disorder: Stable, continue Keppra   Chronic anemia: Baseline ~8 to 9 g: Noted some drop, but overall is stable. Anemia panel in may slightly low iron  otherwise unremarkablke-added iron  po  Fall at home Physical deconditioning Polypharmacy Chronic pain Nondisplaced left 5th through 9th rib fracture in CT at OSH: Continue multimodal pain management-does have allergy to morphine  oxycodone  but able to take Norco WHICH she is on chronically Continue  Lidocaine  patch, hydrocodone  PT OT IS, pulmonary toileting Continue home Flexeril , Valium  twice daily, fluoxetine -minimize sedatives baclofen , Lyrica  for now Mobility: PT Orders: Active PT Follow up Rec: Home Health Pt8/03/2024 1411   TOC unable to find HH so OP PT will be arranged after discussion with patient and family.  Bipolar disorder Cognitive decline: Mood stable, continue Risperdal  at bedtime trazodone , supportive care delirium precaution fall precaution Reports her family manages her medication at home.  She is unable to tell her doctor she usually  follows   Obesity There is no  height or weight on file to calculate BMI.: Will benefit with PCP follow-up, weight loss,healthy lifestyle and outpatient sleep eval if not done.  Mobility: PT Orders: cont ptit PT Follow up Rec: Home Health Pt8/03/2024 1411   DVT prophylaxis: HEPARIN  GTT Code Status:   Code Status: Full Code Family Communication: plan of care discussed with patient at bedside. Patient status is: Remains hospitalized because of severity of illness Level of care: Telemetry Cardiac    Dispo: The patient is from: Home lives with parents            Anticipated disposition: Home Home with family pending Slp clerance  Objective: Vitals last 24 hrs: Vitals:   01/22/24 1945 01/23/24 0007 01/23/24 0419 01/23/24 0753  BP: 116/70 107/62 116/71 112/67  Pulse: 89 85 84 88  Resp: 16 20 18 18   Temp: 97.6 F (36.4 C) 97.9 F (36.6 C) 97.8 F (36.6 C) 97.6 F (36.4 C)  TempSrc: Oral Oral Oral Oral  SpO2: 96% 93% 96% 97%    Physical Examination: General exam: AAOX3,  obese HEENT:Oral mucosa moist, Ear/Nose WNL grossly Respiratory system: Bilaterally clear BS,no use of accessory muscle Cardiovascular system: S1 & S2 +, No JVD. Gastrointestinal system: Abdomen soft, NT,ND, BS+ Nervous System: Alert, awake, moving all extremities,and following commands. Extremities: LE edema neg, distal extremities warm.  Skin: No rashes,no icterus. MSK: Normal muscle bulk,tone, power   Medications reviewed:  Scheduled Meds:  aspirin  EC  81 mg Oral Daily   atorvastatin   80 mg Oral Daily   Chlorhexidine  Gluconate Cloth  6 each Topical Daily   cyclobenzaprine   10 mg Oral TID   dapagliflozin  propanediol  10 mg Oral Daily   diazepam   5 mg Oral BID   FLUoxetine   20 mg Oral q AM   free water   250 mL Oral Once   hydrOXYzine   25 mg Oral TID   insulin  aspart  0-15 Units Subcutaneous TID WC   insulin  aspart  0-5 Units Subcutaneous QHS   levETIRAcetam   1,000 mg Oral Daily   lidocaine   1 patch Transdermal Daily   metoprolol   succinate  12.5 mg Oral Daily   pantoprazole   40 mg Oral Daily   ranolazine   1,000 mg Oral BID   risperiDONE   0.5 mg Oral QHS   sodium chloride  flush  3 mL Intravenous Q12H   spironolactone   25 mg Oral Daily   ticagrelor   90 mg Oral BID   traZODone   200 mg Oral QHS   Continuous Infusions:  sodium chloride  10 mL/hr at 01/23/24 0900   Diet: Diet Order             Diet - low sodium heart healthy           Diet Heart Room service appropriate? Yes; Fluid consistency: Thin  Diet effective now                    Data Reviewed: I have personally reviewed following labs and imaging studies ( see epic result tab) CBC: Recent Labs  Lab 01/19/24 1825 01/19/24 2041 01/20/24 0530 01/21/24 0438 01/22/24 0544  WBC  --  21.7* 22.5* 14.9* 9.4  HGB 9.5* 8.2* 8.0* 7.7* 8.1*  HCT 28.0* 29.1* 28.3* 27.2* 27.8*  MCV  --  68.1* 68.2* 67.8* 66.0*  PLT  --  233 246 223 227   CMP: Recent Labs  Lab 01/19/24 2033 01/20/24 0530 01/21/24 0438 01/22/24 0544 01/23/24 0557  NA 137 137 139 139 138  K 3.8 4.1 3.5 3.7 3.7  CL 103 105 104 106 103  CO2 23 23 25 23 24   GLUCOSE 221* 226* 162* 119* 134*  BUN 13 13 22* 15 14  CREATININE 0.97 0.91 1.14* 0.99 1.03*  CALCIUM  8.6* 8.8* 8.7* 8.8* 9.0  MG 2.0  --   --   --   --    GFR: Estimated Creatinine Clearance: 80.8 mL/min (A) (by C-G formula based on SCr of 1.03 mg/dL (H)). Recent Labs  Lab 01/19/24 2033  AST 71*  ALT 24  ALKPHOS 65  BILITOT 0.7  PROT 6.6  ALBUMIN 3.0*   No results for input(s): LIPASE, AMYLASE in the last 168 hours. No results for input(s): AMMONIA in the last 168 hours. Coagulation Profile: No results for input(s): INR, PROTIME in the last 168 hours. Unresulted Labs (From admission, onward)    None      Antimicrobials/Microbiology: Anti-infectives (From admission, onward)    Start     Dose/Rate Route Frequency Ordered Stop   01/22/24 0000  cefadroxil  (DURICEF) 500 MG capsule        1,000 mg Oral 2  times daily 01/22/24 1449 01/26/24 2359   01/20/24 1000  cefTRIAXone  (ROCEPHIN ) 1 g in sodium chloride  0.9 % 100 mL IVPB        1 g 200 mL/hr over 30 Minutes Intravenous Every 24 hours 01/19/24 1514 01/23/24 0853   01/19/24 1700  azithromycin  (ZITHROMAX ) tablet 500 mg        500 mg Oral Daily 01/19/24 1514 01/21/24 0856         Component Value Date/Time   SDES BLOOD RIGHT ARM 06/23/2019 1911   SPECREQUEST  06/23/2019 1911    BOTTLES DRAWN AEROBIC ONLY Blood Culture results may not be optimal due to an inadequate volume of blood received in culture bottles Performed at [facility], [address], [place], [place] [address]    CULT NO GROWTH 7 DAYS 06/23/2019 1911   REPTSTATUS 06/30/2019 FINAL 06/23/2019 1911    Procedures: Procedure(s) (LRB): LEFT HEART CATH AND CORONARY ANGIOGRAPHY (N/A) RIGHT HEART CATH (N/A)   Mennie LAMY, MD [facility] 01/23/2024, 1:45 PM

## 2024-01-23 NOTE — Evaluation (Addendum)
 Clinical/Bedside Swallow Evaluation Patient Details  Name: Taylor [name] MRN: [id number] Date of Birth: 1980-09-19  Today's Date: 01/23/2024 Time: SLP Start Time (ACUTE ONLY): 0944 SLP Stop Time (ACUTE ONLY): 0951 SLP Time Calculation (min) (ACUTE ONLY): 7 min  Past Medical History:  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   due to elevated blood sugar 1700-off insulin  6 months   Depression    Diabetes mellitus    off insulin  6 months   Diverticulitis    Dyspnea    when walking   GERD (gastroesophageal reflux disease)    Hypertension    Kidney dialysis status    on dialysis(stroke) off after 4-6 weeks   Pneumonia 2013, 06/2018   Respiratory failure (HCC) 04/2012   with stroke - in hosp [facility] 12 weeks   Seizures (HCC)    during elvated blood sugsr episode 11/13   Sepsis (HCC)    Stroke (HCC)    11/2018, 12/2018 Weak left side, speech- slurred, Short term memeroy, Gait unsteady    Vocal cord dysfunction    Past Surgical History:  Past Surgical History:  Procedure Laterality Date   ABDOMINAL AORTOGRAM W/LOWER EXTREMITY N/A 06/25/2019   Procedure: ABDOMINAL AORTOGRAM W/LOWER EXTREMITY;  Surgeon: Serene Gaile ORN, MD;  Location: [facility];  Service: Cardiovascular;  Laterality: N/A;   ABDOMINAL AORTOGRAM W/LOWER EXTREMITY N/A 10/22/2019   Procedure: ABDOMINAL AORTOGRAM W/LOWER EXTREMITY;  Surgeon: Serene Gaile ORN, MD;  Location: [facility];  Service: Cardiovascular;  Laterality: N/A;   ABDOMINAL AORTOGRAM W/LOWER EXTREMITY Left 04/01/2020   Procedure: ABDOMINAL AORTOGRAM W/LOWER EXTREMITY;  Surgeon: Gretta Lonni PARAS, MD;  Location: [facility];  Service: Cardiovascular;  Laterality: Left;   ABDOMINAL AORTOGRAM W/LOWER EXTREMITY N/A 02/01/2022   Procedure: ABDOMINAL AORTOGRAM W/LOWER EXTREMITY;  Surgeon: Serene Gaile ORN, MD;  Location: [facility];  Service: Cardiovascular;  Laterality: N/A;   AMPUTATION Left 07/03/2019   Procedure: LEFT FOOT 4TH AND 5TH RAY AMPUTATION;  Surgeon: Harden Jerona GAILS, MD;  Location: [facility] OR;  Service: Orthopedics;  Laterality: Left;   APPENDECTOMY     CORONARY STENT INTERVENTION N/A 10/24/2023   Procedure: CORONARY STENT INTERVENTION;  Surgeon: Anner Alm ORN, MD;  Location[facility] CV [facility];  Service: Cardiovascular;  Laterality: N/A;   FEMORAL-POPLITEAL BYPASS GRAFT Left 02/04/2022   Procedure: LEFT FEMORAL-POPLITEAL ARTERY BYPASS;  Surgeon: Serene Gaile ORN, MD;  Location: [facility] OR;  Service: Vascular;  Laterality: Left;   INSERTION OF DIALYSIS CATHETER  11/13   removed in 4-6 weeks   IR ANGIO INTRA EXTRACRAN SEL COM CAROTID INNOMINATE BILAT MOD SED  02/07/2019   IR ANGIO VERTEBRAL SEL VERTEBRAL BILAT MOD SED  02/07/2019   IR ANGIOGRAM EXTREMITY LEFT  02/07/2019   IR ANGIOGRAM EXTREMITY RIGHT  02/20/2019   IR TRANSCATH EXCRAN VERT OR CAR A STENT  02/20/2019   IR TRANSCATH EXCRAN VERT OR CAR A STENT  03/25/2019   IR US  GUIDE VASC ACCESS RIGHT  02/07/2019   LEFT HEART CATH AND CORONARY ANGIOGRAPHY N/A 10/20/2023   Procedure: LEFT HEART CATH AND CORONARY ANGIOGRAPHY;  Surgeon: Wendel Lurena POUR, MD;  Location: [facility];  Service: Cardiovascular;  Laterality: N/A;   LEFT HEART CATH AND CORONARY ANGIOGRAPHY N/A 01/19/2024   Procedure: LEFT HEART CATH AND CORONARY ANGIOGRAPHY;  Surgeon:  Thukkani, Arun K, MD;  Location: [facility];  Service: Cardiovascular;  Laterality: N/A;   MULTIPLE EXTRACTIONS WITH ALVEOLOPLASTY N/A 02/24/2014   Procedure: MULTIPLE EXTRACTION WITH ALVEOLOPLASTY AND BIOPSY;  Surgeon: Glendia CHRISTELLA Primrose, DDS;  Location: [facility];  Service: Oral Surgery;  Laterality: N/A;   PERIPHERAL VASCULAR ATHERECTOMY Left 06/25/2019   Procedure: PERIPHERAL VASCULAR ATHERECTOMY;  Surgeon: Serene Gaile ORN, MD;  Location: [facility];  Service: Cardiovascular;  Laterality:  Left;  superficial femoral   PERIPHERAL VASCULAR BALLOON ANGIOPLASTY Left 10/22/2019   Procedure: PERIPHERAL VASCULAR BALLOON ANGIOPLASTY;  Surgeon: Serene Gaile ORN, MD;  Location: [facility];  Service: Cardiovascular;  Laterality: Left;  superficial femoral   PERIPHERAL VASCULAR INTERVENTION  04/01/2020   Procedure: PERIPHERAL VASCULAR INTERVENTION;  Surgeon: Gretta Lonni PARAS, MD;  Location: [facility];  Service: Cardiovascular;;  left SFA stent    RADIOLOGY WITH ANESTHESIA N/A 02/20/2019   Procedure: RADIOLOGY WITH ANESTHESIA   STENTING;  Surgeon: Dolphus Carrion, MD;  Location: [facility];  Service: Radiology;  Laterality: N/A;   RADIOLOGY WITH ANESTHESIA N/A 03/25/2019   Procedure: RADIOLOGY WITH ANESTHESIA STENTING;  Surgeon: Dolphus Carrion, MD;  Location: [facility];  Service: Radiology;  Laterality: N/A;   RIGHT HEART CATH N/A 01/19/2024   Procedure: RIGHT HEART CATH;  Surgeon: Wendel Lurena POUR, MD;  Location: [facility] INVASIVE CV LAB;  Service: Cardiovascular;  Laterality: N/A;   SKIN SPLIT GRAFT Left 07/03/2019   Procedure: APPLY SKIN GRAFT LEFT FOOT;  Surgeon: Harden Jerona GAILS, MD;  Location: [facility];  Service: Orthopedics;  Laterality: Left;   TRACHEOSTOMY  11/13   closed 1/14   TRANSRECTAL DRAINAGE OF PELVIC ABSCESS  12/11   VASCULAR SURGERY  04/01/2020   two stents placed in left leg   HPI:  Taylor [name] is a 43 y.o. female admitted 8/8 for fall at home. In ED had elevated troponin, CTA of chest showed nondisplaced L 5-9 rib fractures, L lung predominant peribronchovascular groundglass opacities and patchy consolidations, may represent PNA, pulmonary hemorrhage, and/or aspiration. Pt also with cardiogenic shock 2/2 NSTEMI. 8/8 s/p urgent cardiac cath with subacute occlusion distal RCA. 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  Pt with L VF paresis in 2013 requiring tracheostomy.  Managed at [facility] with recommendations for nectar thick liquids at that time.  Pt with pontine CVA with MBS in 2020 with recommendations for nectar thick liquids. Silent aspiraiton of thin liquids was observed on both of these prior studies.  Pt has since liberalized diet without further assessement or SLP follow up.  Pt's chest imaging at OSH concerning for aspiration/pneumonia this admission.  Pt with very hoarse vocal quality on arrival which she states is her baseline.  She is a fair-poor historian and could not recall any further management of dysphagia following 2020 MBS or of vocal fold paresis following trach.  Mother later confirmed via phone that there was no follow up.  Pt tolerated trials of thin liquid, puree  and regular solid; however, pt has known hx silent aspiration and she did endorse difficulties with liquids. She frequently coughs with PO intake at home. Pt would benefit from instrumental assessment.  Recommend FEES over MBSS in order to visualize vocal folds given hx of vocal fold paresis.  Pt was unable to consent, but consent was obtained via phone from mother Olam Sar with RN seated next to SLP with SLP repeating/confirming consent.  Will proceed with FEES.  Diet recommendations pending results of instrumental study   SLP Visit Diagnosis: Dysphagia, pharyngeal phase (R13.13)    Aspiration Risk  Severe aspiration risk    Diet Recommendation Regular;Thin liquid (pending evaluation)    Liquid Administration via: Cup;Straw Medication Administration:  (no  specific precautions) Supervision: Patient able to self feed Compensations: Slow rate;Small sips/bites Postural Changes: Seated upright at 90 degrees    Other  Recommendations Recommended Consults: Consider ENT evaluation (voice evaluation, glottal function) Oral Care Recommendations: Oral care BID     Assistance Recommended at Discharge  N/A  Functional Status Assessment  (TBD)  Frequency and Duration  (TBD)          Prognosis Prognosis for improved oropharyngeal function:  (TBD)      Swallow Study   General Date of Onset: 01/19/24 HPI: Taylor [name] is a 43 y.o. female admitted 8/8 for fall at home. In ED had elevated troponin, CTA of chest showed nondisplaced L 5-9 rib fractures, L lung predominant peribronchovascular groundglass opacities and patchy consolidations, may represent PNA, pulmonary hemorrhage, and/or aspiration. Pt also with cardiogenic shock 2/2 NSTEMI. 8/8 s/p urgent cardiac cath with subacute occlusion distal RCA. 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, hypertension, recurrent CVAs with memory loss, type 2 diabetes, previous smoker, seizure disorder, anemia, hx L VF paresis with trach in 2016, hx pontine CVA with MBS in 2020 with recommendations for nectar thick liquids, pt has since liberalized diet Type of Study: Bedside Swallow Evaluation Previous Swallow Assessment: MBSS 2020 with recs for NTL Diet Prior to this Study: Regular;Thin liquids (Level 0) Temperature Spikes Noted: No History of Recent Intubation: No Behavior/Cognition: Alert;Cooperative;Pleasant mood Oral Cavity Assessment: Within Functional Limits Oral Care Completed by SLP: No Oral Cavity - Dentition: Dentures, bottom;Dentures, top Vision: Functional for self-feeding Self-Feeding Abilities: Able to feed self Patient Positioning: Upright in bed Baseline Vocal Quality: Hoarse;Wet;Low vocal  intensity (rough) Volitional Cough: Weak (Very poor cough strength, no apparent VF adduction, huff only) Volitional Swallow: Able to elicit    Oral/Motor/Sensory Function Overall Oral Motor/Sensory Function: Within functional limits Facial ROM: Within Functional Limits Facial Symmetry: Within Functional Limits Lingual ROM: Within Functional Limits Lingual Symmetry: Within Functional Limits Lingual Strength: Within Functional Limits Velum: Within Functional Limits Mandible: Within Functional Limits   Ice Chips Ice chips: Not tested   Thin Liquid Thin Liquid: Impaired Presentation: Straw Other Comments: pt reports subjective difficulty swallowing    Nectar Thick Nectar Thick Liquid: Not tested   Honey Thick Honey Thick Liquid: Not tested   Puree Puree: Within functional limits Presentation: Spoon   Solid     Solid: Impaired Presentation: Self Fed Oral Phase Functional Implications: Oral residue Other Comments: used puree to clear oral cavity independently      Taylor FORBES Grippe, MA, CCC-SLP Acute Rehabilitation Services Office: [phone] 01/23/2024,12:24 PM

## 2024-01-23 NOTE — Progress Notes (Signed)
 Occupational Therapy Treatment Patient Details Name: Taylor [name] MRN: [id number] DOB: November 25, 1980 Today's Date: 01/23/2024   History of present illness Pt is a 43 y.o. female admitted 8/8 for fall at home. In ED had elevated troponin, CTA of chest showed nondisplaced L 5-9 rib fractures, L lung predominant peribronchovascular groundglass opacities and patchy consolidations, may represent PNA, pulmonary hemorrhage, and/or aspiration. Pt also with cardiogenic shock 2/2 NSTEMI. 8/8 s/p urgent cardiac cath with subacute occlusion distal RCA. 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 Pt with baseline cognitive deficits, and presents with poor situational/environmental awareness of lines/leads, attempting to get OOB with lines pulling requiring redirection. Requires MIN A +1 HHA to transfer bed <> BSC, stands for clothing management and hygiene with MIN A steadying assist at trunk. Pt declines hand hygiene or further mobility with RW, requesting to return to bed. Pt hopeful for discharge today. Discharge recommendation appropriate, OT will follow acutely and do not anticipate OT needs at discharge.       If plan is discharge home, recommend the following:  A little help with walking and/or transfers;A little help with bathing/dressing/bathroom;Assistance with cooking/housework;Direct supervision/assist for medications management   Equipment Recommendations  None recommended by OT       Precautions / Restrictions Precautions Precautions: Fall Recall of Precautions/Restrictions: Intact Precaution/Restrictions Comments: L rib fxs 5-9 Restrictions [name] Bearing Restrictions Per Provider Order: No       Mobility Bed  Mobility Overal bed mobility: Modified Independent                  Transfers Overall transfer level: Needs assistance Equipment used: 1 person hand held assist Transfers: Bed to chair/wheelchair/BSC, Sit to/from Stand Sit to Stand: Min assist     Step pivot transfers: Min assist     General transfer comment: minA with HHA; pt self-limits     Balance Overall balance assessment: Needs assistance Sitting-balance support: No upper extremity supported, Feet supported Sitting balance-Leahy Scale: Fair     Standing balance support: Bilateral upper extremity supported, During functional activity, Reliant on assistive device for balance Standing balance-Leahy Scale: Poor Standing balance comment: Reliant on external support                           ADL either performed or assessed with clinical judgement   ADL Overall ADL's : Needs assistance/impaired                         Toilet Transfer: Stand-pivot;Minimal Dentist Details (indicate cue type and reason): minA HHA to step pivot bed <> BSC for continent void. Toileting- Clothing Manipulation and Hygiene: Minimal assistance;Sit to/from stand Toileting - Clothing Manipulation Details (indicate cue type and reason): minA for steadying at trunk, pt refuses hand hygiene after     Functional mobility during ADLs: Minimal assistance General ADL Comments: Decreased awareness of lines/leads/alarm throughout session. Cues for awareness throughout     Communication Communication Communication: No apparent difficulties   Cognition Arousal: Alert Behavior During Therapy: Flat affect Cognition: History of cognitive impairments             OT - Cognition Comments: Memory and cog deficits baseline due to hx  of CVAs                 Following commands: Intact        Cueing   Cueing Techniques: Verbal cues        General Comments Pt asking several times about pain meds  (recieved earlier, STM deficits at baseline)    Pertinent Vitals/ Pain       Pain Assessment Pain Assessment: Faces Faces Pain Scale: Hurts a little bit Pain Location: L sided rib pain Pain Descriptors / Indicators: Discomfort Pain Intervention(s): Limited activity within patient's tolerance, Premedicated before session, Repositioned   Frequency  Min 2X/week        Progress Toward Goals  OT Goals(current goals can now be found in the care plan section)  Progress towards OT goals: Progressing toward goals  Acute Rehab OT Goals OT Goal Formulation: With patient Time For Goal Achievement: 02/04/24 Potential to Achieve Goals: Good ADL Goals Pt Will Perform Grooming: with modified independence;standing Pt Will Perform Lower Body Dressing: with modified independence;sit to/from stand Pt Will Transfer to Toilet: with modified independence;ambulating;regular height toilet Pt Will Perform Toileting - Clothing Manipulation and hygiene: with modified independence;sitting/lateral leans;sit to/from stand  Plan         AM-PAC OT 6 Clicks Daily Activity     Outcome Measure   Help from another person eating meals?: None Help from another person taking care of personal grooming?: A Little Help from another person toileting, which includes using toliet, bedpan, or urinal?: A Little Help from another person bathing (including washing, rinsing, drying)?: A Little Help from another person to put on and taking off regular upper body clothing?: A Little Help from another person to put on and taking off regular lower body clothing?: A Little 6 Click Score: 19    End of Session    OT Visit Diagnosis: Unsteadiness on feet (R26.81)   Activity Tolerance Patient tolerated treatment well   Patient Left in bed;with call bell/phone within reach;with bed alarm set   Nurse Communication Mobility status        Time: 8797-8785 OT Time Calculation (min): 12 min  Charges: OT General  Charges $OT Visit: 1 Visit OT Treatments $Self Care/Home Management : 8-22 mins  Taylor [name] L. Ebin Palazzi, OTR/L  01/23/24, 2:00 PM

## 2024-01-23 NOTE — Procedures (Signed)
 Objective Swallowing Evaluation: Type of Study: Bedside Swallow Evaluation   Patient Details  Name: Taylor [name] MRN: [id number] Date of Birth: February 03, 1981  Today's Date: 01/23/2024 Time: SLP Start Time (ACUTE ONLY): 1041 -SLP Stop Time (ACUTE ONLY): 1055  SLP Time Calculation (min) (ACUTE ONLY): 14 min   Past Medical History:  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   due to elevated blood sugar 1700-off insulin  6 months   Depression    Diabetes mellitus    off insulin  6 months   Diverticulitis    Dyspnea    when walking   GERD (gastroesophageal reflux disease)    Hypertension    Kidney dialysis status    on dialysis(stroke) off after 4-6 weeks   Pneumonia 2013, 06/2018   Respiratory failure (HCC) 04/2012   with stroke - in hosp [facility] 12 weeks   Seizures (HCC)    during elvated blood sugsr episode 11/13   Sepsis (HCC)    Stroke (HCC)    11/2018, 12/2018 Weak left side, speech- slurred, Short term memeroy, Gait unsteady    Vocal cord dysfunction    Past Surgical History:  Past Surgical History:  Procedure Laterality Date   ABDOMINAL AORTOGRAM W/LOWER EXTREMITY N/A 06/25/2019   Procedure: ABDOMINAL AORTOGRAM W/LOWER EXTREMITY;  Surgeon: Serene Gaile ORN, MD;  Location: [facility];  Service: Cardiovascular;  Laterality: N/A;   ABDOMINAL AORTOGRAM W/LOWER EXTREMITY N/A 10/22/2019   Procedure: ABDOMINAL AORTOGRAM W/LOWER EXTREMITY;  Surgeon: Serene Gaile ORN, MD;  Location: [facility];  Service: Cardiovascular;  Laterality: N/A;   ABDOMINAL AORTOGRAM W/LOWER EXTREMITY Left 04/01/2020   Procedure: ABDOMINAL AORTOGRAM W/LOWER EXTREMITY;  Surgeon: Gretta Lonni PARAS, MD;  Location: [facility];  Service: Cardiovascular;  Laterality: Left;   ABDOMINAL AORTOGRAM W/LOWER EXTREMITY N/A 02/01/2022   Procedure: ABDOMINAL  AORTOGRAM W/LOWER EXTREMITY;  Surgeon: Serene Gaile ORN, MD;  Location: [facility];  Service: Cardiovascular;  Laterality: N/A;   AMPUTATION Left 07/03/2019   Procedure: LEFT FOOT 4TH AND 5TH RAY AMPUTATION;  Surgeon: Harden Jerona GAILS, MD;  Location: [facility] OR;  Service: Orthopedics;  Laterality: Left;   APPENDECTOMY     CORONARY STENT INTERVENTION N/A 10/24/2023   Procedure: CORONARY STENT INTERVENTION;  Surgeon: Anner Alm ORN, MD;  Location: [facility];  Service: Cardiovascular;  Laterality: N/A;   FEMORAL-POPLITEAL BYPASS GRAFT Left 02/04/2022   Procedure: LEFT FEMORAL-POPLITEAL ARTERY BYPASS;  Surgeon: Serene Gaile ORN, MD;  Location: [facility];  Service: Vascular;  Laterality: Left;   INSERTION OF DIALYSIS CATHETER  11/13   removed in 4-6 weeks   IR ANGIO INTRA EXTRACRAN SEL COM CAROTID INNOMINATE BILAT MOD SED  02/07/2019   IR ANGIO VERTEBRAL SEL VERTEBRAL BILAT MOD SED  02/07/2019   IR ANGIOGRAM EXTREMITY LEFT  02/07/2019   IR ANGIOGRAM EXTREMITY RIGHT  02/20/2019   IR TRANSCATH EXCRAN VERT OR CAR A STENT  02/20/2019   IR TRANSCATH EXCRAN VERT OR CAR A STENT  03/25/2019   IR US  GUIDE VASC ACCESS RIGHT  02/07/2019   LEFT HEART CATH AND CORONARY ANGIOGRAPHY N/A 10/20/2023   Procedure: LEFT HEART CATH AND CORONARY ANGIOGRAPHY;  Surgeon: Wendel Lurena POUR, MD;  Location: [facility];  Service: Cardiovascular;  Laterality: N/A;   LEFT HEART CATH AND CORONARY ANGIOGRAPHY N/A 01/19/2024  Procedure: LEFT HEART CATH AND CORONARY ANGIOGRAPHY;  Surgeon: Wendel Lurena POUR, MD;  Location: [facility];  Service: Cardiovascular;  Laterality: N/A;   MULTIPLE EXTRACTIONS WITH ALVEOLOPLASTY N/A 02/24/2014   Procedure: MULTIPLE EXTRACTION WITH ALVEOLOPLASTY AND BIOPSY;  Surgeon: Glendia CHRISTELLA Primrose, DDS;  Location: [facility];  Service: Oral Surgery;  Laterality: N/A;   PERIPHERAL VASCULAR ATHERECTOMY Left 06/25/2019   Procedure: PERIPHERAL VASCULAR ATHERECTOMY;  Surgeon: Serene Gaile ORN, MD;  Location: [facility];  Service: Cardiovascular;  Laterality: Left;  superficial femoral   PERIPHERAL VASCULAR BALLOON ANGIOPLASTY Left 10/22/2019   Procedure: PERIPHERAL VASCULAR BALLOON ANGIOPLASTY;  Surgeon: Serene Gaile ORN, MD;  Location: [facility];  Service: Cardiovascular;  Laterality: Left;  superficial femoral   PERIPHERAL VASCULAR INTERVENTION  04/01/2020   Procedure: PERIPHERAL VASCULAR INTERVENTION;  Surgeon: Gretta Lonni PARAS, MD;  Location: [facility];  Service: Cardiovascular;;  left SFA stent    RADIOLOGY WITH ANESTHESIA N/A 02/20/2019   Procedure: RADIOLOGY WITH ANESTHESIA   STENTING;  Surgeon: Dolphus Carrion, MD;  Location: [facility];  Service: Radiology;  Laterality: N/A;   RADIOLOGY WITH ANESTHESIA N/A 03/25/2019   Procedure: RADIOLOGY WITH ANESTHESIA STENTING;  Surgeon: Dolphus Carrion, MD;  Location: [facility];  Service: Radiology;  Laterality: N/A;   RIGHT HEART CATH N/A 01/19/2024   Procedure: RIGHT HEART CATH;  Surgeon: Wendel Lurena POUR, MD;  Location: [facility] CV [facility];  Service: Cardiovascular;  Laterality: N/A;   SKIN SPLIT GRAFT Left 07/03/2019   Procedure: APPLY SKIN GRAFT LEFT FOOT;  Surgeon: Harden Jerona GAILS, MD;  Location: [facility] OR;  Service: Orthopedics;  Laterality: Left;   TRACHEOSTOMY  11/13   closed 1/14   TRANSRECTAL DRAINAGE OF PELVIC ABSCESS  12/11   VASCULAR SURGERY  04/01/2020   two stents placed in left leg   HPI: Taylor [name] is a 43 y.o. female admitted 8/8 for fall at home. In ED had elevated troponin, CTA of chest showed nondisplaced L 5-9 rib fractures, L lung predominant peribronchovascular groundglass opacities and patchy consolidations, may represent PNA, pulmonary hemorrhage, and/or aspiration. Pt also with cardiogenic shock 2/2 NSTEMI. 8/8 s/p urgent cardiac cath with subacute occlusion distal RCA. 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  Consent obtained from family over phone.  Explained procedure to pt who was agreeable.  On scope passage pt began screaming and pulled camera out of R nare.  L nare somewhat occluded with secretions and difficulty to pass.  With second attempt at scope pasage, scope was passed through R nare with pt having significant discomfort.  During pt's vocalizations, bilateral vocal folds were observed to be mobile, however further assessment of glottal closure could not be completed.  Pharyngeal mucosa was pink and moist with no standing secretions and anatomy was grossly normal.  Pt refused all PO trials and  became quite agitated with scope in place.  Scope was removed without administration of bolus trials and further evaluation of swallow function will be completed by FEES later this date.    SLP cannot make recommendation for safe diet at this time.  Continue current diet pending MBSS   SLP Visit Diagnosis Dysphagia, pharyngeal phase (R13.13)    Attention and concentration deficit following --  Frontal lobe and executive function deficit following --  Impact on safety and function Severe aspiration risk         01/23/2024    1:20 PM  Treatment Recommendations  Treatment Recommendations Defer until completion of intrumental exam        01/23/2024    1:27 PM  Prognosis  Prognosis for improved oropharyngeal function --  Barriers to Reach  Goals --  Barriers/Prognosis Comment --       01/23/2024    1:20 PM  Diet Recommendations  SLP Diet Recommendations --  Liquid Administration via --  Medication Administration --  Compensations --  Postural Changes --         01/23/2024    1:20 PM  Other Recommendations  Recommended Consults --  Oral Care Recommendations Oral care BID  Caregiver Recommendations --  Follow Up Recommendations --  Assistance recommended at discharge --  Functional Status Assessment --       01/23/2024    1:20 PM  Frequency and Duration   Speech Therapy Frequency (ACUTE ONLY) --  Treatment Duration --         01/23/2024    1:19 PM  Oral Phase  Oral Phase --  Oral - Pudding Teaspoon --  Oral - Pudding Cup --  Oral - Honey Teaspoon --  Oral - Honey Cup --  Oral - Nectar Teaspoon --  Oral - Nectar Cup --  Oral - Nectar Straw --  Oral - Thin Teaspoon --  Oral - Thin Cup --  Oral - Thin Straw --  Oral - Puree --  Oral - Mech Soft --  Oral - Regular --  Oral - Multi-Consistency --  Oral - Pill --  Oral Phase - Comment --       01/23/2024    1:20 PM  Pharyngeal Phase  Pharyngeal Phase --  Pharyngeal- Pudding Teaspoon --  Pharyngeal --  Pharyngeal- Pudding Cup --  Pharyngeal --  Pharyngeal- Honey Teaspoon --  Pharyngeal --  Pharyngeal- Honey Cup --  Pharyngeal --  Pharyngeal- Nectar Teaspoon --  Pharyngeal --  Pharyngeal- Nectar Cup --  Pharyngeal --  Pharyngeal- Nectar Straw --  Pharyngeal --  Pharyngeal- Thin Teaspoon --  Pharyngeal --  Pharyngeal- Thin Cup --  Pharyngeal --  Pharyngeal- Thin Straw --  Pharyngeal --  Pharyngeal- Puree --  Pharyngeal --  Pharyngeal- Mechanical Soft --  Pharyngeal --  Pharyngeal- Regular --  Pharyngeal --  Pharyngeal- Multi-consistency --  Pharyngeal --  Pharyngeal- Pill --  Pharyngeal --  Pharyngeal Comment --        01/23/2024    1:20 PM  Cervical Esophageal Phase   Cervical Esophageal Phase --  Pudding Teaspoon --   Pudding Cup --  Honey Teaspoon --  Honey Cup --  Nectar Teaspoon --  Nectar Cup --  Nectar Straw --  Thin Teaspoon --  Thin Cup --  Thin Straw --  Puree --  Mechanical Soft --  Regular --  Multi-consistency --  Pill --  Cervical Esophageal Comment --     Dazani Norby E Ludwin Flahive,  MA, CCC-SLP Acute Rehabilitation Services Office: [phone] 01/23/2024, 1:27 PM

## 2024-01-23 NOTE — Progress Notes (Signed)
 Modified Barium Swallow Study  Patient Details  Name: Taylor [name] MRN: [id number] Date of Birth: Apr 15, 1981  Today's Date: 01/23/2024  Modified Barium Swallow completed.  Full report located under Chart Review in the Imaging Section.  History of Present Illness Delta Pichon is a 43 y.o. female admitted 8/8 for fall at home. In ED had elevated troponin, CTA of chest showed nondisplaced L 5-9 rib fractures, L lung predominant peribronchovascular groundglass opacities and patchy consolidations, may represent PNA, pulmonary hemorrhage, and/or aspiration. Pt also with cardiogenic shock 2/2 NSTEMI. 8/8 s/p urgent cardiac cath with subacute occlusion distal RCA. 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, hypertension, recurrent CVAs with memory loss, type 2 diabetes, previous smoker, seizure disorder, anemia, hx L VF paresis with trach in 2016, hx pontine CVA with MBS in 2020 with recommendations for nectar thick liquids, pt has since liberalized diet   Clinical Impression Pt exhibits moderate pharyngeal dysphagia. Limited attempt at FEES earlier this date revealed bilateral vocal fold movement though pt was unable to be challenged to complete further assessment of glottal closure. Given history of L VF immobility, question her ability to achieve full glottal closure which may be contributing to presentation on this MBS. The swallow is consistently initiated at the pyriform sinuses. Aspiration of thin liquids occurred along the posterior trachea as liquid spills over the pyriform sinuses when the larynx elevates during the swallow (PAS 8). Gross aspiration occurred during attempts at a L head turn and this was sensed but her cough was ineffective. Nectar thick liquids are transiently penetrated (PAS 2, WFL) but no further aspiration occurred. There are no significant oral delays or pharyngeal residuals.  Recommend continuing regular diet but with nectar thick liquids. Strongly encourage f/u with OP SLP and ENT. Will continue following acutely. Factors that may increase risk of adverse event in presence of aspiration Noe & Lianne 2021): Poor general health and/or compromised immunity;Weak cough  Swallow Evaluation Recommendations Recommendations: PO diet PO Diet Recommendation: Regular;Mildly thick liquids (Level 2, nectar thick) Liquid Administration via: Cup;Straw Medication Administration: Whole meds with puree Supervision: Patient able to self-feed Swallowing strategies  : Minimize environmental distractions;Slow rate;Small bites/sips Postural changes: Position pt fully upright for meals Oral care recommendations: Oral care QID (4x/day) Recommended consults: Consider ENT consultation Caregiver Recommendations: Avoid jello, ice cream, thin soups, popsicles;Remove water  pitcher    Damien Blumenthal, M.A., CCC-SLP Speech Language Pathology, Acute Rehabilitation Services  Secure Chat preferred [phone]  01/23/2024,1:57 PM

## 2024-01-23 NOTE — Plan of Care (Signed)
  Problem: Clinical Measurements: Goal: Respiratory complications will improve Outcome: Progressing Goal: Cardiovascular complication will be avoided Outcome: Progressing   Problem: Activity: Goal: Risk for activity intolerance will decrease Outcome: Progressing   Problem: Nutrition: Goal: Adequate nutrition will be maintained Outcome: Progressing   Problem: Pain Managment: Goal: General experience of comfort will improve and/or be controlled Outcome: Progressing   Problem: Safety: Goal: Ability to remain free from injury will improve Outcome: Progressing   Problem: Activity: Goal: Ability to return to baseline activity level will improve Outcome: Progressing   Problem: Cardiovascular: Goal: Ability to achieve and maintain adequate cardiovascular perfusion will improve Outcome: Progressing   Problem: Activity: Goal: Ability to return to baseline activity level will improve Outcome: Progressing   Problem: Cardiovascular: Goal: Ability to achieve and maintain adequate cardiovascular perfusion will improve Outcome: Progressing Goal: Vascular access site(s) Level 0-1 will be maintained Outcome: Progressing

## 2024-01-24 ENCOUNTER — Encounter (HOSPITAL_COMMUNITY)

## 2024-01-25 MED FILL — Heparin Sod (Porcine) in NaCl IV Soln 25000 Unit/250ML-0.45%: INTRAVENOUS | Qty: 250 | Status: AC

## 2024-01-26 ENCOUNTER — Encounter (HOSPITAL_COMMUNITY)

## 2024-01-29 ENCOUNTER — Encounter (HOSPITAL_COMMUNITY)

## 2024-01-31 ENCOUNTER — Encounter (HOSPITAL_COMMUNITY)

## 2024-02-02 ENCOUNTER — Ambulatory Visit: Attending: Internal Medicine | Admitting: Physical Therapy

## 2024-02-02 ENCOUNTER — Encounter (HOSPITAL_COMMUNITY)

## 2024-02-02 NOTE — Therapy (Incomplete)
 OUTPATIENT PHYSICAL THERAPY LOWER EXTREMITY EVALUATION   Patient Name: Taylor [name] MRN: [id number] DOB:1980/11/04, 43 y.o., female Today's Date: 02/02/2024  END OF SESSION:   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   due to elevated blood sugar 1700-off insulin  6 months   Depression    Diabetes mellitus    off insulin  6 months   Diverticulitis    Dyspnea    when walking   GERD (gastroesophageal reflux disease)    Hypertension    Kidney dialysis status    on dialysis(stroke) off after 4-6 weeks   Pneumonia 2013, 06/2018   Respiratory failure (HCC) 04/2012   with stroke - in hosp [facility] 12 weeks   Seizures (HCC)    during elvated blood sugsr episode 11/13   Sepsis (HCC)    Stroke (HCC)    11/2018, 12/2018 Weak left side, speech- slurred, Short term memeroy, Gait unsteady    Vocal cord dysfunction    Past Surgical History:  Procedure Laterality Date   ABDOMINAL AORTOGRAM W/LOWER EXTREMITY N/A 06/25/2019   Procedure: ABDOMINAL AORTOGRAM W/LOWER EXTREMITY;  Surgeon: Serene Gaile ORN, MD;  Location: [facility];  Service: Cardiovascular;  Laterality: N/A;   ABDOMINAL AORTOGRAM W/LOWER EXTREMITY N/A 10/22/2019   Procedure: ABDOMINAL AORTOGRAM W/LOWER EXTREMITY;  Surgeon: Serene Gaile ORN, MD;  Location: [facility];  Service: Cardiovascular;  Laterality: N/A;   ABDOMINAL AORTOGRAM W/LOWER EXTREMITY Left 04/01/2020   Procedure: ABDOMINAL AORTOGRAM W/LOWER EXTREMITY;  Surgeon: Gretta Lonni PARAS, MD;  Location: [facility];  Service: Cardiovascular;  Laterality: Left;   ABDOMINAL AORTOGRAM W/LOWER EXTREMITY N/A 02/01/2022   Procedure: ABDOMINAL AORTOGRAM W/LOWER EXTREMITY;  Surgeon: Serene Gaile ORN, MD;  Location: [facility];  Service: Cardiovascular;  Laterality: N/A;   AMPUTATION Left 07/03/2019   Procedure: LEFT  FOOT 4TH AND 5TH RAY AMPUTATION;  Surgeon: Harden Jerona GAILS, MD;  Location[facility] OR;  Service: Orthopedics;  Laterality: Left;   APPENDECTOMY     CORONARY STENT INTERVENTION N/A 10/24/2023   Procedure: CORONARY STENT INTERVENTION;  Surgeon: Anner Alm ORN, MD;  Location: [facility] INVASIVE CV LAB;  Service: Cardiovascular;  Laterality: N/A;   FEMORAL-POPLITEAL BYPASS GRAFT Left 02/04/2022   Procedure: LEFT FEMORAL-POPLITEAL ARTERY BYPASS;  Surgeon: Serene Gaile ORN, MD;  Location: [facility];  Service: Vascular;  Laterality: Left;   INSERTION OF DIALYSIS CATHETER  11/13   removed in 4-6 weeks   IR ANGIO INTRA EXTRACRAN SEL COM CAROTID INNOMINATE BILAT MOD SED  02/07/2019   IR ANGIO VERTEBRAL SEL VERTEBRAL BILAT MOD SED  02/07/2019   IR ANGIOGRAM EXTREMITY LEFT  02/07/2019   IR ANGIOGRAM EXTREMITY RIGHT  02/20/2019   IR TRANSCATH EXCRAN VERT OR CAR A STENT  02/20/2019   IR TRANSCATH EXCRAN VERT OR CAR A STENT  03/25/2019   IR US  GUIDE VASC ACCESS RIGHT  02/07/2019   LEFT HEART CATH AND CORONARY ANGIOGRAPHY N/A 10/20/2023   Procedure: LEFT HEART CATH AND CORONARY ANGIOGRAPHY;  Surgeon: Wendel Lurena POUR, MD;  [facility]: [facility];  Service: Cardiovascular;  Laterality: N/A;   LEFT HEART CATH AND CORONARY ANGIOGRAPHY N/A 01/19/2024   Procedure: LEFT HEART CATH AND CORONARY ANGIOGRAPHY;  Surgeon: Wendel Lurena POUR, MD;  Location: [facility];  Service: Cardiovascular;  Laterality: N/A;   MULTIPLE EXTRACTIONS WITH ALVEOLOPLASTY  N/A 02/24/2014   Procedure: MULTIPLE EXTRACTION WITH ALVEOLOPLASTY AND BIOPSY;  Surgeon: Glendia CHRISTELLA Primrose, DDS;  Location: [facility];  Service: Oral Surgery;  Laterality: N/A;   PERIPHERAL VASCULAR ATHERECTOMY Left 06/25/2019   Procedure: PERIPHERAL VASCULAR ATHERECTOMY;  Surgeon: Serene Gaile ORN, MD;  [facility]: [facility];  Service: Cardiovascular;  Laterality: Left;  superficial femoral   PERIPHERAL VASCULAR BALLOON ANGIOPLASTY Left 10/22/2019   Procedure: PERIPHERAL VASCULAR BALLOON  ANGIOPLASTY;  Surgeon: Serene Gaile ORN, MD;  [facility];  Service: Cardiovascular;  Laterality: Left;  superficial femoral   PERIPHERAL VASCULAR INTERVENTION  04/01/2020   Procedure: PERIPHERAL VASCULAR INTERVENTION;  Surgeon: Gretta Lonni PARAS, MD;  [facility]: [facility];  Service: Cardiovascular;;  left SFA stent    RADIOLOGY WITH ANESTHESIA N/A 02/20/2019   Procedure: RADIOLOGY WITH ANESTHESIA   STENTING;  Surgeon: Dolphus Carrion, MD;  Location: [facility];  Service: Radiology;  Laterality: N/A;   RADIOLOGY WITH ANESTHESIA N/A 03/25/2019   Procedure: RADIOLOGY WITH ANESTHESIA STENTING;  Surgeon: Dolphus Carrion, MD;  Location: [facility];  Service: Radiology;  Laterality: N/A;   RIGHT HEART CATH N/A 01/19/2024   Procedure: RIGHT HEART CATH;  Surgeon: Wendel Lurena POUR, MD;  Location: [facility] INVASIVE CV LAB;  Service: Cardiovascular;  Laterality: N/A;   SKIN SPLIT GRAFT Left 07/03/2019   Procedure: APPLY SKIN GRAFT LEFT FOOT;  Surgeon: Harden Jerona GAILS, MD;  Location: [facility] OR;  Service: Orthopedics;  Laterality: Left;   TRACHEOSTOMY  11/13   closed 1/14   TRANSRECTAL DRAINAGE OF PELVIC ABSCESS  12/11   VASCULAR SURGERY  04/01/2020   two stents placed in left leg   Patient Active Problem List   Diagnosis Date Noted   Cardiogenic shock (HCC) 01/21/2024   CAP (community acquired pneumonia) 01/19/2024   Non-ST elevation (NSTEMI) myocardial infarction (HCC) 01/19/2024   Systolic congestive heart failure (HCC) 01/18/2024   Atrial fibrillation (HCC) 01/18/2024   Long term (current) use of antithrombotics/antiplatelets 01/18/2024   Sinus tachycardia 01/18/2024   HLD (hyperlipidemia) 12/17/2023   Chronic systolic heart failure (HCC) 12/17/2023   Coronary artery disease involving native coronary artery of native heart 10/23/2023   Coronary artery disease involving native coronary artery of native heart with unstable angina pectoris (HCC) 10/22/2023   Unstable angina (HCC)  10/19/2023   Substernal chest pain 10/18/2023   Chest pain 10/17/2023   Pneumonia 10/17/2023   AKI (acute kidney injury) (HCC) 10/17/2023   Anemia 10/17/2023   Seizures (HCC)    Class 2 severe obesity due to excess calories with serious comorbidity and body mass index (BMI) of 38.0 to 38.9 in adult (HCC) 08/23/2023   At high risk for falls 08/23/2023   Claudication (HCC) 02/04/2022   Pain in right foot 07/22/2020   Diabetic foot infection (HCC) 06/23/2019   PAD (peripheral artery disease) (HCC)    Osteomyelitis of left foot (HCC)    Vertebral artery stenosis, left 03/25/2019   VBI (vertebrobasilar insufficiency) 02/20/2019   Intracranial carotid stenosis, left 01/23/2019   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   Tobacco use disorder 01/02/2015   Cognitive impairment 01/02/2015   Stroke (HCC) 09/07/2012   Pain in upper limb 09/07/2012   Pain in lower limb 09/07/2012   Bipolar affective disorder (HCC) 09/07/2012   Depressive disorder 09/07/2012   Type II diabetes mellitus (HCC) 09/07/2012   Memory loss 09/07/2012   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  Rationale for Evaluation and Treatment: Rehabilitation  ONSET DATE: ***  SUBJECTIVE:   SUBJECTIVE STATEMENT: ***  PERTINENT HISTORY: 8/8 s/p urgent cardiac cath with subacute occlusion distal RCA.  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 {OPRCPAIN:27236}  PRECAUTIONS: {Therapy precautions:24002}  RED FLAGS: {PT Red Flags:29287}   WEIGHT BEARING RESTRICTIONS: {Yes ***/No:24003}  FALLS:  Has patient fallen in last 6 months? {fallsyesno:27318}  LIVING ENVIRONMENT: Lives with: {OPRC lives with:25569::lives with their family} Lives in: {Lives in:25570} Stairs: {opstairs:27293} Has following equipment at home: {Assistive devices:23999}  OCCUPATION: ***  PLOF: {PLOF:24004}  PATIENT GOALS: ***  NEXT MD VISIT: ***  OBJECTIVE:  Note: Objective measures were completed at Evaluation unless otherwise noted.  DIAGNOSTIC FINDINGS: CTA chest showed nondisplaced left 5th through 9th rib fractures, left lung predominant peribronchovascular groundglass opacities and patchy consolidations, may represent pneumonia, pulmonary hemorrhage, and/or aspiration   PATIENT SURVEYS:  {rehab surveys:24030}  COGNITION: Overall cognitive status: {cognition:24006}     SENSATION: {sensation:27233}  EDEMA:  {edema:24020}  MUSCLE LENGTH: Hamstrings: Right *** deg; Left *** deg Thomas test: Right *** deg; Left *** deg  POSTURE: {posture:25561}  PALPATION: ***  LOWER EXTREMITY ROM:  {AROM/PROM:27142} ROM Right eval Left eval  Hip flexion    Hip extension    Hip abduction    Hip adduction    Hip internal rotation    Hip external rotation    Knee flexion    Knee extension    Ankle dorsiflexion    Ankle plantarflexion    Ankle inversion    Ankle eversion     (Blank rows = not tested)  LOWER EXTREMITY MMT:  MMT Right eval Left eval  Hip flexion    Hip extension    Hip abduction    Hip adduction    Hip internal rotation    Hip external rotation     Knee flexion    Knee extension    Ankle dorsiflexion    Ankle plantarflexion    Ankle inversion    Ankle eversion     (Blank rows = not tested)  LOWER EXTREMITY SPECIAL TESTS:  {LEspecialtests:26242}  FUNCTIONAL TESTS:  {Functional tests:24029}  GAIT: Distance walked: *** Assistive device utilized: {Assistive devices:23999} Level of assistance: {Levels of assistance:24026} Comments: ***  TREATMENT DATE: ***    PATIENT EDUCATION:  Education details: *** Person educated: {Person educated:25204} Education method: {Education Method:25205} Education comprehension: {Education Comprehension:25206}  HOME EXERCISE PROGRAM: ***  ASSESSMENT:  CLINICAL IMPRESSION: Patient is a *** y.o. *** who was seen today for physical therapy evaluation and treatment for ***.   OBJECTIVE IMPAIRMENTS: {opptimpairments:25111}.   ACTIVITY LIMITATIONS: {activitylimitations:27494}  PARTICIPATION LIMITATIONS: {participationrestrictions:25113}  PERSONAL FACTORS: {Personal factors:25162} are also affecting patient's functional outcome.   REHAB POTENTIAL: {rehabpotential:25112}  CLINICAL DECISION MAKING: {clinical decision making:25114}  EVALUATION COMPLEXITY: {Evaluation complexity:25115}   GOALS: Goals reviewed with patient? {yes/no:20286}  SHORT TERM GOALS: Target date: *** *** Baseline: Goal status: INITIAL  2.  *** Baseline:  Goal status: INITIAL  3.  *** Baseline:  Goal status: INITIAL  4.  *** Baseline:  Goal status: INITIAL  5.  *** Baseline:  Goal status: INITIAL  6.  *** Baseline:  Goal status: INITIAL  LONG TERM GOALS: Target date: ***  *** Baseline:  Goal status: INITIAL  2.  *** Baseline:  Goal status: INITIAL  3.  *** Baseline:  Goal status: INITIAL  4.  *** Baseline:  Goal status: INITIAL  5.  *** Baseline:   Goal status: INITIAL  6.  *** Baseline:  Goal status: INITIAL   PLAN:  PT FREQUENCY: {rehab frequency:25116}  PT DURATION: {rehab duration:25117}  PLANNED INTERVENTIONS: {rehab planned interventions:25118::97110-Therapeutic exercises,97530- Therapeutic [phone]- Neuromuscular re-education,97535- Self Rjmz,02859- Manual therapy}  PLAN FOR NEXT SESSION: ***   Ethelwyn Gilbertson April Ma L Eymi Lipuma, PT, DPT 02/02/2024, 10:10 AM

## 2024-02-10 ENCOUNTER — Other Ambulatory Visit: Payer: Self-pay | Admitting: Internal Medicine

## 2024-02-10 DIAGNOSIS — R52 Pain, unspecified: Secondary | ICD-10-CM

## 2024-02-15 ENCOUNTER — Encounter: Attending: Nurse Practitioner | Admitting: Nurse Practitioner

## 2024-02-15 ENCOUNTER — Encounter: Payer: Self-pay | Admitting: Nurse Practitioner

## 2024-02-15 VITALS — BP 118/80 | HR 88 | Ht 64.0 in | Wt 199.0 lb

## 2024-02-15 DIAGNOSIS — I5020 Unspecified systolic (congestive) heart failure: Secondary | ICD-10-CM | POA: Diagnosis not present

## 2024-02-15 DIAGNOSIS — E785 Hyperlipidemia, unspecified: Secondary | ICD-10-CM | POA: Insufficient documentation

## 2024-02-15 DIAGNOSIS — R5383 Other fatigue: Secondary | ICD-10-CM | POA: Insufficient documentation

## 2024-02-15 DIAGNOSIS — I2510 Atherosclerotic heart disease of native coronary artery without angina pectoris: Secondary | ICD-10-CM | POA: Insufficient documentation

## 2024-02-15 DIAGNOSIS — D649 Anemia, unspecified: Secondary | ICD-10-CM | POA: Insufficient documentation

## 2024-02-15 DIAGNOSIS — I10 Essential (primary) hypertension: Secondary | ICD-10-CM | POA: Insufficient documentation

## 2024-02-15 NOTE — Patient Instructions (Addendum)
 Medication Instructions:  Your physician recommends that you continue on your current medications as directed. Please refer to the Current Medication list given to you today.  Labwork: In 1-2 weeks with [facility]   Testing/Procedures: None   Follow-Up: Your physician recommends that you schedule a follow-up appointment in: 2-3 months   Any Other Special Instructions Will Be Listed Below (If Applicable).  If you need a refill on your cardiac medications before your next appointment, please call your pharmacy.

## 2024-02-15 NOTE — Progress Notes (Signed)
 Cardiology Office Note   Date:  02/15/2024 ID:  Taylor [name], DOB Jan 14, 1981, MRN [id number] PCP: Taylor Domino, NP  [facility] Providers Cardiologist:  Taylor [name] Maywood, MD     History of Present Illness Taylor [name] is a 43 y.o. female with a PMH of CAD, CHF, sinus tachycardia, hx of CVA, HTN, HLD, T2DM, bipolar disorder, anxiety, who presents today for hospital follow-up.   Previous hospital admission in May 2025, CT cardiac revealed calcium  score of 2837, see full report below. Underwent LHC that revealed severe multivessel CAD, not a candidate for CABG, underwent PCI of mid to distal left circumflex.   Seen by Dr. Maywood on December 07, 2023. She was overall doing well at the time. Started on low dose Entresto , Ibavradine 5 mg BID, and Wegovy . Last seen on 01/18/2024. Noted having exertional chest pains, lasting 5 minutes, no DOE. HR were elevated d/t anxiety. Ivabradine  was increased to 7.5 mg BID. Started on Aldactone  12.5 mg once daily. Ranexa  was increased to 1000 BID.   Today she presents for 6 week follow-up. She is here with her mom. Overall doing well, but admits to fatigue, mother says she has no energy. Currently is seeing a new PCP, Dr. [name]. Denies any chest pain, shortness of breath, palpitations, syncope, presyncope, dizziness, orthopnea, PND, swelling or significant weight changes, acute bleeding, or claudication.  ROS: Negative. See HPI.   Studies Reviewed  EKG: EKG is not ordered today.   Echo 01/2024:  1. Left ventricular ejection fraction, by estimation, is 35%. The left  ventricle has moderately decreased function. The left ventricle  demonstrates global hypokinesis. Left ventricular diastolic parameters are  indeterminate.   2. Right ventricular systolic function is normal. The right ventricular  size is normal.   3. The mitral valve is normal in structure. Trivial mitral valve  regurgitation. No evidence of mitral stenosis.   4. The aortic  valve has an indeterminant number of cusps. Aortic valve  regurgitation is not visualized. No aortic stenosis is present.   5. The inferior vena cava is normal in size with <50% respiratory  variability, suggesting right atrial pressure of 8 mmHg.   LHC 01/2024:    Dist LM lesion is 50% stenosed.   Prox LAD lesion is 70% stenosed.   Mid LAD lesion is 40% stenosed.   Ost Cx to Prox Cx lesion is 70% stenosed.   Dist Cx lesion is 50% stenosed.   Dist RCA lesion is 100% stenosed.   Prox RCA lesion is 65% stenosed.   Mid RCA lesion is 99% stenosed.   1st Diag lesion is 70% stenosed.   Non-stenotic Mid Cx to Dist Cx lesion was previously treated.   1.  Subacute occlusion of distal right coronary artery that is now collateralized by the septal perforators.  Given the poor outflow of this vessel based on previous angiogram, no PCI was pursued. 2.  Patent mid left circumflex stent with moderate, diffuse disease of the LAD. 3.  LVEDP of 31 mmHg 4.  Given ongoing hypotension in the lab, a Swan-Ganz catheter was placed from the right IJ approach with the following hemodynamics:            Fick cardiac output of 6.25 L/min, Fick cardiac index of 3.1 L/min/m TD output of 5.5 L/min, TD index of 2.7 L/min/m            Right atrial pressure mean of 12 mmHg  RV of 48/8 with an end-diastolic pressure of 15 mmHg            Wedge pressure mean of 26 mmHg with V waves to 32 mmHg            PA pressure 56/21 9 with a mean of 41 mmHg PVR of 2.4 (Fick);  2.7 (TD) PA pulsatility index of 2.25 5.  Point-of-care lactate in cardiac catheterization laboratory of 0.6 with reassuring ABG.   Recommendation: Given mean arterial pressures of greater than 70 mm, norepinephrine  was weaned off.  Medical therapy for occluded right coronary artery with resumption of heparin  drip, dual antiplatelet therapy, and pain control.  Given patient's morphine  and codeine allergy, scheduled Toradol  will be administered.   Results reviewed with Dr. Cherrie, the 2H attending, who will assume care.  Coronary stent intervention 10/2023:    Dist LM lesion is 50% stenosed.   Prox LAD lesion is 70% stenosed.  1st Diag lesion is 70% stenosed-eccentric calcified.  Mid LAD lesion is 40% stenosed.   Ost Cx to Prox Cx lesion is 70% stenosed -eccentric calcified.   CULPRIT LESION: Mid Cx to Dist Cx lesion is 99% stenosed.   A drug-eluting stent was successfully placed using a STENT SYNERGY XD 2.50X12 -> postdilated in tapered fashion from 2.9-2.6 mm.  TIMI-3 flow maintained.SABRA  Post intervention, there is a 0% residual stenosis.   Lesion #2: Dist Cx lesion is 99% stenosed.   Balloon angioplasty was performed using a BALLOON TAKERU 1.5X12.Post intervention, there is a 50% residual stenosis.   Diagnostic: Dominance: Right                                                   Intervention               Successful DES PCI of the most significant lesion-99% mid-distal LCx using Synergy XD 2.5 mm x 12 mm stent deployed in tapered fashion from 2.9-2.6 mm.   Balloon angioplasty of 99% lesion distal to stent and small caliber vessel using 1.5 mm 12 mm balloon.  Reducing 99% to 50% stenosis.   The decision to treat the most significant culprit lesion as the first PCI option was based on the extent of disease and potential difficulty of treating LAD and LCx with existing RCA disease.  Preferably she will do well with PCI of the 99% stenosis and medical management on the remainder of the vessels.   RECOMMENDATIONS Anticipated discharge date to be determined. Plan will be to treat existing disease in the LM-ostial LCx, proximal LAD and diffuse RCA with medical management. If she were to have recurrent anginal symptoms after this PCI would consider debulking PCI of the proximal LAD crossing the diagonal branch but sparing the ostium of the LAD.  This would be step to a PCI if necessary. Step 3 PCI if she continues to have symptoms despite  mid LCx and proximal LAD PCI, with then consider distal LM-ostial LCx PCI from LM into LCx with provisional PTCA/PCI of the ostial LAD although this would be preferably last choice to avoid left main PCI. Would recommend treating the RCA medically based on the diffuse nature of the disease as it would require essentially full metal jacket throughout the entire vessel to adequately treat. Recommend uninterrupted dual antiplatelet therapy with Aspirin  81mg  daily and Ticagrelor  90mg   twice daily for a minimum of 12 months (ACS-Class I recommendation). Would continue long-term Thienopyridine coverage after initial year based on extent of disease.  Can reduce to 60 mg twice daily Brilinta  versus converting to clopidogrel  75 mg daily.   Physical Exam VS:  BP 118/80   Pulse 88   Ht 5' 4 (1.626 m)   Wt 199 lb (90.3 kg)   LMP 07/15/2023   SpO2 98%   BMI 34.16 kg/m        Wt Readings from Last 3 Encounters:  02/15/24 199 lb (90.3 kg)  01/23/24 216 lb 4.3 oz (98.1 kg)  01/18/24 212 lb (96.2 kg)    GEN: Obese, 43 y.o. female in no acute distress NECK: No JVD; No carotid bruits CARDIAC: S1/S2, RRR, no murmurs, rubs, gallops RESPIRATORY:  Clear to auscultation without rales, wheezing or rhonchi  ABDOMEN: Soft, non-tender, non-distended EXTREMITIES:  No edema; No deformity   ASSESSMENT AND PLAN  HFrEF Stage C, NYHA class I-II symptoms. EF 35% 01/2024. Euvolemic and well compensated on exam. She has lost 21 lbs since June 2025. Continue current medication regimen. Low sodium diet, fluid restriction <2L, and daily weights encouraged. Educated to contact our office for weight gain of 2 lbs overnight or 5 lbs in one week.   CAD Stable with no anginal symptoms. No indication for ischemic evaluation. Continue current medication regimen. See most recent LHC noted above, felt not to be a good candidate for CABG. Heart healthy diet and regular cardiovascular exercise encouraged. Care and ED precautions  discussed.   HTN BP stable. Discussed to monitor BP at home at least 2 hours after medications and sitting for 5-10 minutes. No medication changes at this time. Heart healthy diet and regular cardiovascular exercise encouraged.   HLD LDL 47 from earlier this year.  Continue atorvastatin . Heart healthy diet and regular cardiovascular exercise encouraged.   Fatigue, anemia Most likely due to her anemia.  Recent hemoglobin 10.2.  Will obtain iron  and B12 and folate panel.  Continue to follow with PCP.      Dispo: Follow-up in 2 to 3 months or sooner if any changes with MD or APP.  Signed, Almarie Crate, NP

## 2024-02-15 NOTE — Telephone Encounter (Signed)
 Defer to PCP. Pain management referral is recommended.

## 2024-02-28 NOTE — Telephone Encounter (Signed)
 Referral placed to pin management [place]

## 2024-03-07 ENCOUNTER — Ambulatory Visit: Admitting: Internal Medicine

## 2024-03-11 ENCOUNTER — Telehealth: Payer: Self-pay | Admitting: Nurse Practitioner

## 2024-03-11 MED ORDER — TICAGRELOR 90 MG PO TABS: 90.0000 mg | ORAL_TABLET | Freq: Two times a day (BID) | ORAL | 3 refills | Status: AC

## 2024-03-11 NOTE — Telephone Encounter (Signed)
 RX sent in

## 2024-03-11 NOTE — Telephone Encounter (Signed)
*  STAT* If patient is at the pharmacy, call can be transferred to refill team.   1. Which medications need to be refilled? (please list name of each medication and dose if known)   ticagrelor  (BRILINTA ) 90 MG TABS tablet    4. Which pharmacy/location (including street and city if local pharmacy) is medication to be sent to?  [facility] - [place] - [address]     5. Do they need a 30 day or 90 day supply? 90

## 2024-05-16 ENCOUNTER — Encounter: Payer: Self-pay | Admitting: Nurse Practitioner

## 2024-05-16 ENCOUNTER — Ambulatory Visit: Attending: Nurse Practitioner | Admitting: Nurse Practitioner

## 2024-05-16 ENCOUNTER — Ambulatory Visit

## 2024-05-16 ENCOUNTER — Telehealth: Payer: Self-pay | Admitting: Nurse Practitioner

## 2024-05-16 ENCOUNTER — Other Ambulatory Visit: Payer: Self-pay | Admitting: Nurse Practitioner

## 2024-05-16 VITALS — BP 124/80 | HR 92 | Ht 64.0 in | Wt 189.8 lb

## 2024-05-16 DIAGNOSIS — R5383 Other fatigue: Secondary | ICD-10-CM | POA: Diagnosis present

## 2024-05-16 DIAGNOSIS — D649 Anemia, unspecified: Secondary | ICD-10-CM | POA: Insufficient documentation

## 2024-05-16 DIAGNOSIS — I5020 Unspecified systolic (congestive) heart failure: Secondary | ICD-10-CM | POA: Insufficient documentation

## 2024-05-16 DIAGNOSIS — I2510 Atherosclerotic heart disease of native coronary artery without angina pectoris: Secondary | ICD-10-CM | POA: Insufficient documentation

## 2024-05-16 DIAGNOSIS — R002 Palpitations: Secondary | ICD-10-CM

## 2024-05-16 DIAGNOSIS — I10 Essential (primary) hypertension: Secondary | ICD-10-CM | POA: Diagnosis present

## 2024-05-16 DIAGNOSIS — E785 Hyperlipidemia, unspecified: Secondary | ICD-10-CM | POA: Insufficient documentation

## 2024-05-16 NOTE — Telephone Encounter (Signed)
 Checking percert on the following   14 Day ZIO XT dx: palpitations

## 2024-05-16 NOTE — Progress Notes (Signed)
 Cardiology Office Note   Date:  05/16/2024 ID:  Taylor [name], DOB 27-Aug-1980, MRN [id number] PCP: Taylor Lynwood NOVAK, MD  [facility] Providers Cardiologist:  [name], MD     History of Present Illness Taylor [name] is a 43 y.o. female with a PMH of CAD, CHF, sinus tachycardia, hx of CVA, HTN, HLD, T2DM, bipolar disorder, anxiety, who presents today for scheduled follow-up.   Previous hospital admission in May 2025, CT cardiac revealed calcium  score of 2837, see full report below. Underwent LHC that revealed severe multivessel CAD, not a candidate for CABG, underwent PCI of mid to distal left circumflex.   Seen by Dr. Maywood on December 07, 2023. She was overall doing well at the time. Started on low dose Entresto , Ibavradine 5 mg BID, and Wegovy . Last seen on 01/18/2024. Noted having exertional chest pains, lasting 5 minutes, no DOE. HR were elevated d/t anxiety. Ivabradine  was increased to 7.5 mg BID. Started on Aldactone  12.5 mg once daily. Ranexa  was increased to 1000 BID.   02/15/2024 - Today she presents for 6 week follow-up. She is here with her mom. Overall doing well, but admits to fatigue, mother says she has no energy. Currently is seeing a new PCP, Dr. [name]. Denies any chest pain, shortness of breath, palpitations, syncope, presyncope, dizziness, orthopnea, PND, swelling or significant weight changes, acute bleeding, or claudication.  05/16/2024 - Here for follow-up. Her weight is down 10 lbs since I last saw her and is doing this through lifestyle changes. Continues to note fatigue and also notices palpitations. She says the palpitations have been occurring over th past couple of months about 1-2 days per week and will occur a few times per day. Denies any chest pain, shortness of breath, syncope, presyncope, dizziness, orthopnea, PND, swelling or significant weight changes, acute bleeding, or claudication.  ROS: Negative. See HPI.   Studies Reviewed  EKG: EKG is  not ordered today.   Echo 01/2024:  1. Left ventricular ejection fraction, by estimation, is 35%. The left  ventricle has moderately decreased function. The left ventricle  demonstrates global hypokinesis. Left ventricular diastolic parameters are  indeterminate.   2. Right ventricular systolic function is normal. The right ventricular  size is normal.   3. The mitral valve is normal in structure. Trivial mitral valve  regurgitation. No evidence of mitral stenosis.   4. The aortic valve has an indeterminant number of cusps. Aortic valve  regurgitation is not visualized. No aortic stenosis is present.   5. The inferior vena cava is normal in size with <50% respiratory  variability, suggesting right atrial pressure of 8 mmHg.   LHC 01/2024:    Dist LM lesion is 50% stenosed.   Prox LAD lesion is 70% stenosed.   Mid LAD lesion is 40% stenosed.   Ost Cx to Prox Cx lesion is 70% stenosed.   Dist Cx lesion is 50% stenosed.   Dist RCA lesion is 100% stenosed.   Prox RCA lesion is 65% stenosed.   Mid RCA lesion is 99% stenosed.   1st Diag lesion is 70% stenosed.   Non-stenotic Mid Cx to Dist Cx lesion was previously treated.   1.  Subacute occlusion of distal right coronary artery that is now collateralized by the septal perforators.  Given the poor outflow of this vessel based on previous angiogram, no PCI was pursued. 2.  Patent mid left circumflex stent with moderate, diffuse disease of the LAD. 3.  LVEDP of 31  mmHg 4.  Given ongoing hypotension in the lab, a Swan-Ganz catheter was placed from the right IJ approach with the following hemodynamics:            Fick cardiac output of 6.25 L/min, Fick cardiac index of 3.1 L/min/m TD output of 5.5 L/min, TD index of 2.7 L/min/m            Right atrial pressure mean of 12 mmHg            RV of 48/8 with an end-diastolic pressure of 15 mmHg            Wedge pressure mean of 26 mmHg with V waves to 32 mmHg            PA pressure 56/21 9 with  a mean of 41 mmHg PVR of 2.4 (Fick);  2.7 (TD) PA pulsatility index of 2.25 5.  Point-of-care lactate in cardiac catheterization laboratory of 0.6 with reassuring ABG.   Recommendation: Given mean arterial pressures of greater than 70 mm, norepinephrine  was weaned off.  Medical therapy for occluded right coronary artery with resumption of heparin  drip, dual antiplatelet therapy, and pain control.  Given patient's morphine  and codeine allergy, scheduled Toradol  will be administered.  Results reviewed with Dr. Cherrie, the 2H attending, who will assume care.  Coronary stent intervention 10/2023:    Dist LM lesion is 50% stenosed.   Prox LAD lesion is 70% stenosed.  1st Diag lesion is 70% stenosed-eccentric calcified.  Mid LAD lesion is 40% stenosed.   Ost Cx to Prox Cx lesion is 70% stenosed -eccentric calcified.   CULPRIT LESION: Mid Cx to Dist Cx lesion is 99% stenosed.   A drug-eluting stent was successfully placed using a STENT SYNERGY XD 2.50X12 -> postdilated in tapered fashion from 2.9-2.6 mm.  TIMI-3 flow maintained.SABRA  Post intervention, there is a 0% residual stenosis.   Lesion #2: Dist Cx lesion is 99% stenosed.   Balloon angioplasty was performed using a BALLOON TAKERU 1.5X12.Post intervention, there is a 50% residual stenosis.   Diagnostic: Dominance: Right                                                   Intervention               Successful DES PCI of the most significant lesion-99% mid-distal LCx using Synergy XD 2.5 mm x 12 mm stent deployed in tapered fashion from 2.9-2.6 mm.   Balloon angioplasty of 99% lesion distal to stent and small caliber vessel using 1.5 mm 12 mm balloon.  Reducing 99% to 50% stenosis.   The decision to treat the most significant culprit lesion as the first PCI option was based on the extent of disease and potential difficulty of treating LAD and LCx with existing RCA disease.  Preferably she will do well with PCI of the 99% stenosis and medical  management on the remainder of the vessels.   RECOMMENDATIONS Anticipated discharge date to be determined. Plan will be to treat existing disease in the LM-ostial LCx, proximal LAD and diffuse RCA with medical management. If she were to have recurrent anginal symptoms after this PCI would consider debulking PCI of the proximal LAD crossing the diagonal branch but sparing the ostium of the LAD.  This would be step to a PCI if necessary. Step 3  PCI if she continues to have symptoms despite mid LCx and proximal LAD PCI, with then consider distal LM-ostial LCx PCI from LM into LCx with provisional PTCA/PCI of the ostial LAD although this would be preferably last choice to avoid left main PCI. Would recommend treating the RCA medically based on the diffuse nature of the disease as it would require essentially full metal jacket throughout the entire vessel to adequately treat. Recommend uninterrupted dual antiplatelet therapy with Aspirin  81mg  daily and Ticagrelor  90mg  twice daily for a minimum of 12 months (ACS-Class I recommendation). Would continue long-term Thienopyridine coverage after initial year based on extent of disease.  Can reduce to 60 mg twice daily Brilinta  versus converting to clopidogrel  75 mg daily.   Physical Exam VS:  BP 124/80 (BP Location: Right Arm, Patient Position: Sitting, Cuff Size: Normal)   Pulse 92   Ht 5' 4 (1.626 m)   Wt 189 lb 12.8 oz (86.1 kg)   SpO2 98%   BMI 32.58 kg/m        Wt Readings from Last 3 Encounters:  05/16/24 189 lb 12.8 oz (86.1 kg)  02/15/24 199 lb (90.3 kg)  01/23/24 216 lb 4.3 oz (98.1 kg)    GEN: Obese, 43 y.o. female in no acute distress NECK: No JVD; No carotid bruits CARDIAC: S1/S2, RRR, no murmurs, rubs, gallops RESPIRATORY:  Clear to auscultation without rales, wheezing or rhonchi  ABDOMEN: Soft, non-tender, non-distended EXTREMITIES:  No edema; No deformity   ASSESSMENT AND PLAN  HFrEF Stage C, NYHA class I-II symptoms. EF 35%  01/2024. Euvolemic and well compensated on exam. She has lost 31 lbs since June 2025. Continue current medication regimen. Low sodium diet, fluid restriction <2L, and daily weights encouraged. Educated to contact our office for weight gain of 2 lbs overnight or 5 lbs in one week.   2. Palpitations Most likely in setting of her anemia - see below. Will arrange 2 week Zio XT monitor for further evaluation. No medication changes at this time. Heart healthy diet and regular cardiovascular exercise encouraged.   3. CAD Stable with no anginal symptoms. No indication for ischemic evaluation. Continue current medication regimen. See most recent LHC noted above, felt not to be a good candidate for CABG. Heart healthy diet and regular cardiovascular exercise encouraged. Care and ED precautions discussed.   4. HTN BP stable. Discussed to monitor BP at home at least 2 hours after medications and sitting for 5-10 minutes. No medication changes at this time. Heart healthy diet and regular cardiovascular exercise encouraged.   5.  HLD LDL 47 from earlier this year.  Continue atorvastatin . Heart healthy diet and regular cardiovascular exercise encouraged.   6. Fatigue, anemia Most likely due to her anemia.  Recent hemoglobin 10.2.  Will request previously ordered labs including: iron  and B12 and folate panel.  Continue to follow with PCP.      Dispo: Follow-up in 6-8 weeks or sooner if any changes with MD or APP.  Signed, Almarie Crate, NP

## 2024-05-16 NOTE — Patient Instructions (Addendum)
 Medication Instructions:  Your physician recommends that you continue on your current medications as directed. Please refer to the Current Medication list given to you today.  Labwork: B12 and Folate and Iron  Non-fasting [facility]  Testing/Procedures: 14 Day ZIO XT-see instructions inside pamphlet.  Follow-Up: Your physician recommends that you schedule a follow-up appointment in: 6-8 weeks  Any Other Special Instructions Will Be Listed Below (If Applicable).  If you need a refill on your cardiac medications before your next appointment, please call your pharmacy.

## 2024-06-03 ENCOUNTER — Telehealth: Payer: Self-pay | Admitting: Neurology

## 2024-06-03 ENCOUNTER — Other Ambulatory Visit: Payer: Self-pay | Admitting: Neurology

## 2024-06-03 ENCOUNTER — Other Ambulatory Visit: Payer: Self-pay | Admitting: Internal Medicine

## 2024-06-03 NOTE — Telephone Encounter (Signed)
 [facility] pharmacy called and LM with AN. Pt needs a refill on her keppra . They do her packaging. PT has not been seen since 2024, she needs an appt. Called pt and LM. Called pharmacy and LM as well

## 2024-06-04 ENCOUNTER — Other Ambulatory Visit: Payer: Self-pay

## 2024-06-04 MED ORDER — SPIRONOLACTONE 25 MG PO TABS: 25.0000 mg | ORAL_TABLET | Freq: Every day | ORAL | 6 refills | Status: AC

## 2024-06-04 NOTE — Telephone Encounter (Signed)
 Rx sent.

## 2024-06-04 NOTE — Telephone Encounter (Signed)
 Called patient and left a message for a call back.

## 2024-06-04 NOTE — Telephone Encounter (Signed)
 Pt f/u appt set, needs refill of Keppr

## 2024-06-10 ENCOUNTER — Telehealth: Payer: Self-pay | Admitting: Pharmacy Technician

## 2024-06-10 ENCOUNTER — Other Ambulatory Visit (HOSPITAL_COMMUNITY): Payer: Self-pay

## 2024-06-10 NOTE — Telephone Encounter (Signed)
" ° ° ° °  Per test claim goes through for free no problem   I called [facility] and asked them to fill -they were still getting needed a prior auth  I gave them ndc: [phone] and it went through. They will try to order that ndc  "

## 2024-06-18 ENCOUNTER — Telehealth: Payer: Self-pay | Admitting: *Deleted

## 2024-06-18 NOTE — Telephone Encounter (Signed)
 Called patient about no show appt today. States her brother came over and they all went out to eat. Plans on being here Wednesday.

## 2024-07-03 NOTE — Telephone Encounter (Signed)
 Per Olam, mother of patient, patient removed heart monitor on 05/16/2025 and refuse to wear it. Advised that monitor can be shipped back to [facility]. Verbalized understanding.

## 2024-07-04 ENCOUNTER — Encounter: Payer: Self-pay | Admitting: *Deleted

## 2024-07-16 NOTE — Telephone Encounter (Signed)
 Spoke with patient's mother about Taylor [name] missing cardiac rehab appts. Asked if she felt like she would be coming back to class and if it was a good fit for her. Her mother implied that she wouldn't be coming back just due to Taylor [name]'s lack of motivation.

## 2024-07-18 ENCOUNTER — Ambulatory Visit: Admitting: Nurse Practitioner

## 2024-07-18 NOTE — Progress Notes (Unsigned)
 " Cardiology Office Note   Date:  05/16/2024 ID:  Taylor [name], DOB Oct 13, 1980, MRN [id number] PCP: Taylor Lynwood NOVAK, MD  [facility] Providers Cardiologist:  Taylor SHAUNNA Maywood, MD     History of Present Illness Taylor [name] is a 44 y.o. female with a PMH of CAD, CHF, sinus tachycardia, hx of CVA, HTN, HLD, T2DM, bipolar disorder, anxiety, who presents today for scheduled follow-up.   Previous hospital admission in May 2025, CT cardiac revealed calcium  score of 2837, see full report below. Underwent LHC that revealed severe multivessel CAD, not a candidate for CABG, underwent PCI of mid to distal left circumflex.   Seen by Dr. Maywood on December 07, 2023. She was overall doing well at the time. Started on low dose Entresto , Ibavradine 5 mg BID, and Wegovy . Last seen on 01/18/2024. Noted having exertional chest pains, lasting 5 minutes, no DOE. HR were elevated d/t anxiety. Ivabradine  was increased to 7.5 mg BID. Started on Aldactone  12.5 mg once daily. Ranexa  was increased to 1000 BID.   02/15/2024 - Today she presents for 6 week follow-up. She is here with her mom. Overall doing well, but admits to fatigue, mother says she has no energy. Currently is seeing a new PCP, Dr. [name]. Denies any chest pain, shortness of breath, palpitations, syncope, presyncope, dizziness, orthopnea, PND, swelling or significant weight changes, acute bleeding, or claudication.  05/16/2024 - Here for follow-up. Her weight is down 10 lbs since I last saw her and is doing this through lifestyle changes. Continues to note fatigue and also notices palpitations. She says the palpitations have been occurring over th past couple of months about 1-2 days per week and will occur a few times per day. Denies any chest pain, shortness of breath, syncope, presyncope, dizziness, orthopnea, PND, swelling or significant weight changes, acute bleeding, or claudication.  ROS: Negative. See HPI.   Studies Reviewed  EKG: EKG is  not ordered today.   Echo 01/2024:  1. Left ventricular ejection fraction, by estimation, is 35%. The left  ventricle has moderately decreased function. The left ventricle  demonstrates global hypokinesis. Left ventricular diastolic parameters are  indeterminate.   2. Right ventricular systolic function is normal. The right ventricular  size is normal.   3. The mitral valve is normal in structure. Trivial mitral valve  regurgitation. No evidence of mitral stenosis.   4. The aortic valve has an indeterminant number of cusps. Aortic valve  regurgitation is not visualized. No aortic stenosis is present.   5. The inferior vena cava is normal in size with <50% respiratory  variability, suggesting right atrial pressure of 8 mmHg.   LHC 01/2024:    Dist LM lesion is 50% stenosed.   Prox LAD lesion is 70% stenosed.   Mid LAD lesion is 40% stenosed.   Ost Cx to Prox Cx lesion is 70% stenosed.   Dist Cx lesion is 50% stenosed.   Dist RCA lesion is 100% stenosed.   Prox RCA lesion is 65% stenosed.   Mid RCA lesion is 99% stenosed.   1st Diag lesion is 70% stenosed.   Non-stenotic Mid Cx to Dist Cx lesion was previously treated.   1.  Subacute occlusion of distal right coronary artery that is now collateralized by the septal perforators.  Given the poor outflow of this vessel based on previous angiogram, no PCI was pursued. 2.  Patent mid left circumflex stent with moderate, diffuse disease of the LAD. 3.  LVEDP of  31 mmHg 4.  Given ongoing hypotension in the lab, a Swan-Ganz catheter was placed from the right IJ approach with the following hemodynamics:            Fick cardiac output of 6.25 L/min, Fick cardiac index of 3.1 L/min/m TD output of 5.5 L/min, TD index of 2.7 L/min/m            Right atrial pressure mean of 12 mmHg            RV of 48/8 with an end-diastolic pressure of 15 mmHg            Wedge pressure mean of 26 mmHg with V waves to 32 mmHg            PA pressure 56/21 9 with  a mean of 41 mmHg PVR of 2.4 (Fick);  2.7 (TD) PA pulsatility index of 2.25 5.  Point-of-care lactate in cardiac catheterization laboratory of 0.6 with reassuring ABG.   Recommendation: Given mean arterial pressures of greater than 70 mm, norepinephrine  was weaned off.  Medical therapy for occluded right coronary artery with resumption of heparin  drip, dual antiplatelet therapy, and pain control.  Given patient's morphine  and codeine allergy, scheduled Toradol  will be administered.  Results reviewed with Dr. Cherrie, the 2H attending, who will assume care.  Coronary stent intervention 10/2023:    Dist LM lesion is 50% stenosed.   Prox LAD lesion is 70% stenosed.  1st Diag lesion is 70% stenosed-eccentric calcified.  Mid LAD lesion is 40% stenosed.   Ost Cx to Prox Cx lesion is 70% stenosed -eccentric calcified.   CULPRIT LESION: Mid Cx to Dist Cx lesion is 99% stenosed.   A drug-eluting stent was successfully placed using a STENT SYNERGY XD 2.50X12 -> postdilated in tapered fashion from 2.9-2.6 mm.  TIMI-3 flow maintained.Taylor [name]  Post intervention, there is a 0% residual stenosis.   Lesion #2: Dist Cx lesion is 99% stenosed.   Balloon angioplasty was performed using a BALLOON TAKERU 1.5X12.Post intervention, there is a 50% residual stenosis.   Diagnostic: Dominance: Right                                                   Intervention               Successful DES PCI of the most significant lesion-99% mid-distal LCx using Synergy XD 2.5 mm x 12 mm stent deployed in tapered fashion from 2.9-2.6 mm.   Balloon angioplasty of 99% lesion distal to stent and small caliber vessel using 1.5 mm 12 mm balloon.  Reducing 99% to 50% stenosis.   The decision to treat the most significant culprit lesion as the first PCI option was based on the extent of disease and potential difficulty of treating LAD and LCx with existing RCA disease.  Preferably she will do well with PCI of the 99% stenosis and medical  management on the remainder of the vessels.   RECOMMENDATIONS Anticipated discharge date to be determined. Plan will be to treat existing disease in the LM-ostial LCx, proximal LAD and diffuse RCA with medical management. If she were to have recurrent anginal symptoms after this PCI would consider debulking PCI of the proximal LAD crossing the diagonal branch but sparing the ostium of the LAD.  This would be step to a PCI if necessary. Step  3 PCI if she continues to have symptoms despite mid LCx and proximal LAD PCI, with then consider distal LM-ostial LCx PCI from LM into LCx with provisional PTCA/PCI of the ostial LAD although this would be preferably last choice to avoid left main PCI. Would recommend treating the RCA medically based on the diffuse nature of the disease as it would require essentially full metal jacket throughout the entire vessel to adequately treat. Recommend uninterrupted dual antiplatelet therapy with Aspirin  81mg  daily and Ticagrelor  90mg  twice daily for a minimum of 12 months (ACS-Class I recommendation). Would continue long-term Thienopyridine coverage after initial year based on extent of disease.  Can reduce to 60 mg twice daily Brilinta  versus converting to clopidogrel  75 mg daily.   Physical Exam VS:  There were no vitals taken for this visit.       Wt Readings from Last 3 Encounters:  05/16/24 189 lb 12.8 oz (86.1 kg)  02/15/24 199 lb (90.3 kg)  01/23/24 216 lb 4.3 oz (98.1 kg)    GEN: Obese, 44 y.o. female in no acute distress NECK: No JVD; No carotid bruits CARDIAC: S1/S2, RRR, no murmurs, rubs, gallops RESPIRATORY:  Clear to auscultation without rales, wheezing or rhonchi  ABDOMEN: Soft, non-tender, non-distended EXTREMITIES:  No edema; No deformity   ASSESSMENT AND PLAN  HFrEF Stage C, NYHA class I-II symptoms. EF 35% 01/2024. Euvolemic and well compensated on exam. She has lost 31 lbs since June 2025. Continue current medication regimen. Low sodium  diet, fluid restriction <2L, and daily weights encouraged. Educated to contact our office for weight gain of 2 lbs overnight or 5 lbs in one week.   2. Palpitations Most likely in setting of her anemia - see below. Will arrange 2 week Zio XT monitor for further evaluation. No medication changes at this time. Heart healthy diet and regular cardiovascular exercise encouraged.   3. CAD Stable with no anginal symptoms. No indication for ischemic evaluation. Continue current medication regimen. See most recent LHC noted above, felt not to be a good candidate for CABG. Heart healthy diet and regular cardiovascular exercise encouraged. Care and ED precautions discussed.   4. HTN BP stable. Discussed to monitor BP at home at least 2 hours after medications and sitting for 5-10 minutes. No medication changes at this time. Heart healthy diet and regular cardiovascular exercise encouraged.   5.  HLD LDL 47 from earlier this year.  Continue atorvastatin . Heart healthy diet and regular cardiovascular exercise encouraged.   6. Fatigue, anemia Most likely due to her anemia.  Recent hemoglobin 10.2.  Will request previously ordered labs including: iron  and B12 and folate panel.  Continue to follow with PCP.      Dispo: Follow-up in 6-8 weeks or sooner if any changes with MD or APP.  Signed, Almarie Crate, NP   "

## 2024-09-10 ENCOUNTER — Ambulatory Visit: Payer: Self-pay | Admitting: Neurology
